# Patient Record
Sex: Female | Born: 1955 | Race: White | Hispanic: No | Marital: Married | Smoking: Former smoker
Health system: Southern US, Community
[De-identification: ages and names within clinical notes are randomized; demographics above are authoritative.]

## PROBLEM LIST (undated history)

## (undated) DIAGNOSIS — M48061 Spinal stenosis, lumbar region without neurogenic claudication: Secondary | ICD-10-CM

## (undated) DIAGNOSIS — F419 Anxiety disorder, unspecified: Secondary | ICD-10-CM

## (undated) DIAGNOSIS — D369 Benign neoplasm, unspecified site: Secondary | ICD-10-CM

## (undated) DIAGNOSIS — F909 Attention-deficit hyperactivity disorder, unspecified type: Secondary | ICD-10-CM

## (undated) DIAGNOSIS — T7840XA Allergy, unspecified, initial encounter: Secondary | ICD-10-CM

## (undated) DIAGNOSIS — Z789 Other specified health status: Secondary | ICD-10-CM

## (undated) DIAGNOSIS — R09A2 Foreign body sensation, throat: Secondary | ICD-10-CM

## (undated) DIAGNOSIS — K219 Gastro-esophageal reflux disease without esophagitis: Secondary | ICD-10-CM

## (undated) DIAGNOSIS — M1990 Unspecified osteoarthritis, unspecified site: Secondary | ICD-10-CM

## (undated) DIAGNOSIS — L4050 Arthropathic psoriasis, unspecified: Secondary | ICD-10-CM

## (undated) DIAGNOSIS — E785 Hyperlipidemia, unspecified: Secondary | ICD-10-CM

## (undated) DIAGNOSIS — R198 Other specified symptoms and signs involving the digestive system and abdomen: Secondary | ICD-10-CM

## (undated) DIAGNOSIS — G4700 Insomnia, unspecified: Secondary | ICD-10-CM

## (undated) DIAGNOSIS — R0989 Other specified symptoms and signs involving the circulatory and respiratory systems: Secondary | ICD-10-CM

## (undated) DIAGNOSIS — R32 Unspecified urinary incontinence: Secondary | ICD-10-CM

## (undated) DIAGNOSIS — F32A Depression, unspecified: Secondary | ICD-10-CM

## (undated) DIAGNOSIS — F329 Major depressive disorder, single episode, unspecified: Secondary | ICD-10-CM

## (undated) DIAGNOSIS — T8859XA Other complications of anesthesia, initial encounter: Secondary | ICD-10-CM

## (undated) DIAGNOSIS — H269 Unspecified cataract: Secondary | ICD-10-CM

## (undated) DIAGNOSIS — T4145XA Adverse effect of unspecified anesthetic, initial encounter: Secondary | ICD-10-CM

## (undated) DIAGNOSIS — E042 Nontoxic multinodular goiter: Secondary | ICD-10-CM

## (undated) HISTORY — PX: TONSILLECTOMY: SUR1361

## (undated) HISTORY — DX: Gastro-esophageal reflux disease without esophagitis: K21.9

## (undated) HISTORY — DX: Benign neoplasm, unspecified site: D36.9

## (undated) HISTORY — DX: Other specified symptoms and signs involving the digestive system and abdomen: R19.8

## (undated) HISTORY — DX: Hyperlipidemia, unspecified: E78.5

## (undated) HISTORY — DX: Allergy, unspecified, initial encounter: T78.40XA

## (undated) HISTORY — PX: EYE SURGERY: SHX253

## (undated) HISTORY — DX: Unspecified osteoarthritis, unspecified site: M19.90

## (undated) HISTORY — DX: Unspecified urinary incontinence: R32

## (undated) HISTORY — PX: COLONOSCOPY: SHX174

## (undated) HISTORY — DX: Foreign body sensation, throat: R09.A2

## (undated) HISTORY — DX: Nontoxic multinodular goiter: E04.2

## (undated) HISTORY — PX: POLYPECTOMY: SHX149

## (undated) HISTORY — DX: Unspecified cataract: H26.9

## (undated) HISTORY — DX: Spinal stenosis, lumbar region without neurogenic claudication: M48.061

## (undated) HISTORY — DX: Attention-deficit hyperactivity disorder, unspecified type: F90.9

## (undated) HISTORY — DX: Major depressive disorder, single episode, unspecified: F32.9

## (undated) HISTORY — PX: COLONOSCOPY W/ POLYPECTOMY: SHX1380

## (undated) HISTORY — DX: Other specified symptoms and signs involving the circulatory and respiratory systems: R09.89

## (undated) HISTORY — DX: Arthropathic psoriasis, unspecified: L40.50

## (undated) HISTORY — DX: Insomnia, unspecified: G47.00

## (undated) HISTORY — DX: Depression, unspecified: F32.A

## (undated) HISTORY — DX: Other specified health status: Z78.9

## (undated) HISTORY — DX: Anxiety disorder, unspecified: F41.9

---

## 2002-08-04 ENCOUNTER — Encounter: Admission: RE | Admit: 2002-08-04 | Discharge: 2002-08-04 | Payer: Self-pay | Admitting: Obstetrics and Gynecology

## 2002-08-04 ENCOUNTER — Encounter: Payer: Self-pay | Admitting: Obstetrics and Gynecology

## 2003-08-06 ENCOUNTER — Encounter: Admission: RE | Admit: 2003-08-06 | Discharge: 2003-08-06 | Payer: Self-pay | Admitting: Obstetrics and Gynecology

## 2003-10-10 HISTORY — PX: UPPER GASTROINTESTINAL ENDOSCOPY: SHX188

## 2004-08-24 ENCOUNTER — Encounter: Admission: RE | Admit: 2004-08-24 | Discharge: 2004-08-24 | Payer: Self-pay | Admitting: Obstetrics and Gynecology

## 2004-10-09 HISTORY — PX: FOOT SURGERY: SHX648

## 2005-04-10 ENCOUNTER — Ambulatory Visit: Payer: Self-pay | Admitting: Family Medicine

## 2005-04-13 ENCOUNTER — Ambulatory Visit: Payer: Self-pay | Admitting: Family Medicine

## 2005-08-29 ENCOUNTER — Ambulatory Visit: Payer: Self-pay | Admitting: Family Medicine

## 2005-09-08 ENCOUNTER — Ambulatory Visit: Payer: Self-pay | Admitting: Family Medicine

## 2005-09-18 ENCOUNTER — Ambulatory Visit: Payer: Self-pay | Admitting: Family Medicine

## 2005-10-06 ENCOUNTER — Other Ambulatory Visit: Payer: Self-pay

## 2005-10-06 ENCOUNTER — Inpatient Hospital Stay: Payer: Self-pay | Admitting: Internal Medicine

## 2005-10-19 ENCOUNTER — Ambulatory Visit: Payer: Self-pay | Admitting: Infectious Diseases

## 2005-11-07 ENCOUNTER — Encounter: Admission: RE | Admit: 2005-11-07 | Discharge: 2005-11-07 | Payer: Self-pay | Admitting: Obstetrics and Gynecology

## 2006-01-27 LAB — HM DEXA SCAN

## 2007-05-09 ENCOUNTER — Ambulatory Visit: Payer: Self-pay

## 2008-04-02 ENCOUNTER — Ambulatory Visit: Payer: Self-pay | Admitting: Internal Medicine

## 2008-09-15 ENCOUNTER — Ambulatory Visit: Payer: Self-pay | Admitting: Family Medicine

## 2008-09-15 DIAGNOSIS — M1990 Unspecified osteoarthritis, unspecified site: Secondary | ICD-10-CM | POA: Insufficient documentation

## 2008-09-15 DIAGNOSIS — F325 Major depressive disorder, single episode, in full remission: Secondary | ICD-10-CM | POA: Insufficient documentation

## 2008-09-15 DIAGNOSIS — L259 Unspecified contact dermatitis, unspecified cause: Secondary | ICD-10-CM | POA: Insufficient documentation

## 2008-09-15 DIAGNOSIS — E7849 Other hyperlipidemia: Secondary | ICD-10-CM | POA: Insufficient documentation

## 2008-09-15 DIAGNOSIS — F329 Major depressive disorder, single episode, unspecified: Secondary | ICD-10-CM

## 2008-09-15 DIAGNOSIS — J309 Allergic rhinitis, unspecified: Secondary | ICD-10-CM | POA: Insufficient documentation

## 2008-09-15 DIAGNOSIS — K219 Gastro-esophageal reflux disease without esophagitis: Secondary | ICD-10-CM | POA: Insufficient documentation

## 2008-09-23 ENCOUNTER — Encounter: Payer: Self-pay | Admitting: Internal Medicine

## 2008-09-24 ENCOUNTER — Ambulatory Visit: Payer: Self-pay | Admitting: Internal Medicine

## 2008-10-06 ENCOUNTER — Ambulatory Visit: Payer: Self-pay | Admitting: Internal Medicine

## 2008-10-06 DIAGNOSIS — Z86010 Personal history of colonic polyps: Secondary | ICD-10-CM | POA: Insufficient documentation

## 2008-10-06 DIAGNOSIS — D369 Benign neoplasm, unspecified site: Secondary | ICD-10-CM

## 2008-10-06 HISTORY — DX: Benign neoplasm, unspecified site: D36.9

## 2008-10-13 ENCOUNTER — Telehealth: Payer: Self-pay | Admitting: Internal Medicine

## 2008-10-15 ENCOUNTER — Encounter: Payer: Self-pay | Admitting: Internal Medicine

## 2009-01-29 ENCOUNTER — Ambulatory Visit (HOSPITAL_COMMUNITY): Admission: RE | Admit: 2009-01-29 | Discharge: 2009-01-29 | Payer: Self-pay | Admitting: Obstetrics and Gynecology

## 2009-09-27 ENCOUNTER — Ambulatory Visit: Payer: Self-pay | Admitting: Internal Medicine

## 2010-01-27 LAB — HM PAP SMEAR: HM Pap smear: NORMAL

## 2010-04-28 ENCOUNTER — Ambulatory Visit: Payer: Self-pay | Admitting: General Practice

## 2010-07-20 ENCOUNTER — Ambulatory Visit: Payer: Self-pay | Admitting: Psychology

## 2010-07-27 ENCOUNTER — Ambulatory Visit: Payer: Self-pay | Admitting: Psychology

## 2010-08-03 ENCOUNTER — Ambulatory Visit: Payer: Self-pay | Admitting: Psychology

## 2010-08-17 ENCOUNTER — Ambulatory Visit: Payer: Self-pay | Admitting: Psychology

## 2010-10-05 ENCOUNTER — Encounter
Admission: RE | Admit: 2010-10-05 | Discharge: 2010-10-05 | Payer: Self-pay | Source: Home / Self Care | Attending: Obstetrics and Gynecology | Admitting: Obstetrics and Gynecology

## 2011-01-28 LAB — HM PAP SMEAR: HM Pap smear: NORMAL

## 2011-08-18 ENCOUNTER — Encounter: Payer: Self-pay | Admitting: Internal Medicine

## 2011-08-21 ENCOUNTER — Ambulatory Visit (INDEPENDENT_AMBULATORY_CARE_PROVIDER_SITE_OTHER): Payer: 59 | Admitting: Internal Medicine

## 2011-08-21 ENCOUNTER — Encounter: Payer: Self-pay | Admitting: Internal Medicine

## 2011-08-21 DIAGNOSIS — Z124 Encounter for screening for malignant neoplasm of cervix: Secondary | ICD-10-CM

## 2011-08-21 DIAGNOSIS — Z23 Encounter for immunization: Secondary | ICD-10-CM

## 2011-08-21 DIAGNOSIS — M19079 Primary osteoarthritis, unspecified ankle and foot: Secondary | ICD-10-CM | POA: Insufficient documentation

## 2011-08-21 DIAGNOSIS — N390 Urinary tract infection, site not specified: Secondary | ICD-10-CM

## 2011-08-21 DIAGNOSIS — M1990 Unspecified osteoarthritis, unspecified site: Secondary | ICD-10-CM

## 2011-08-21 DIAGNOSIS — Z1239 Encounter for other screening for malignant neoplasm of breast: Secondary | ICD-10-CM

## 2011-08-21 DIAGNOSIS — M129 Arthropathy, unspecified: Secondary | ICD-10-CM

## 2011-08-21 LAB — POCT URINALYSIS DIPSTICK
Glucose, UA: NEGATIVE
Nitrite, UA: NEGATIVE
Protein, UA: NEGATIVE
Urobilinogen, UA: 0.2

## 2011-08-21 MED ORDER — CELECOXIB 200 MG PO CAPS: 200.0000 mg | ORAL_CAPSULE | Freq: Two times a day (BID) | ORAL | Status: DC

## 2011-08-21 NOTE — Assessment & Plan Note (Signed)
Records requested

## 2011-08-21 NOTE — Progress Notes (Signed)
Subjective:    Patient ID: Madeline Young, female    DOB: Jul 19, 1956, 55 y.o.   MRN: [id number]  HPI  .     Review of Systems     Objective:   Physical Exam        Assessment & Plan:   Subjective:     Madeline Young is a 55 y.o. female and is here for a comprehensive physical exam. The patient reports problems - wit joints in hands and feets continuing to be destroyed by an as yet to be named inflammatory arthritis. She was recently sent to rheumatology, first to Dr. Kathi Ludwig who did not diagnose RA.  She returned to Dr. Kellie Simmering in [facility] and saw a hand specialist Dr. Shepard General because of  bilateral thumb joint deterioration who refused to recommend surgery until her condition was diagnosed and treated.  She has sought a 3rd opinion on rheumatologist. Dr. 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  She does not need a PAP.   ,  Colonoscopy in 2010 by gessner.  Records not available.   History   Social History  . Marital Status: Married    Spouse Name: N/A    Number of Children: N/A  . Years of Education: N/A   Occupational History  . Not on file.   Social History Main Topics  . Smoking status: Former Smoker    Types: Cigarettes    Quit date: 08/21/1971  . Smokeless tobacco: Never Used  . Alcohol Use: Yes  . Drug Use: No  . Sexually Active: Not on file   Other Topics Concern  . Not on file   Social History Narrative  . No narrative on file     The following portions of the patient's history were reviewed and updated as appropriate: allergies, current medications, past family history, past medical history, past social history, past surgical history and problem list.  Review of Systems Pertinent items are noted in HPI.   Objective:    BP 114/60  Pulse 81  Temp(Src) 98.2 F (36.8 C) (Oral)  Resp 14  Ht 5' 6.5" (1.689 m)  Wt 174 lb 4 oz (79.039 kg)  BMI 27.70 kg/m2  SpO2 100% General appearance: alert, cooperative and appears stated age Head: Normocephalic, without obvious abnormality, atraumatic Eyes: conjunctivae/corneas clear. PERRL, EOM's intact. Fundi benign. Throat: lips, mucosa, and tongue normal; teeth and gums normal Neck: no adenopathy, no carotid bruit, no JVD, supple, symmetrical, trachea midline and thyroid not enlarged, symmetric, no tenderness/mass/nodules Back: symmetric, no curvature. ROM normal. No CVA tenderness. Lungs: clear to auscultation bilaterally Breasts: normal appearance, no masses or tenderness Heart: regular rate and rhythm, S1, S2 normal, no murmur, click, rub or gallop Abdomen: soft, non-tender; bowel sounds normal; no masses,  no organomegaly Extremities: extremities normal, atraumatic, no cyanosis or edema  desructive changes noted both thenar joints  Pulses: 2+ and symmetric    Assessment:    Healthy female exam.  Pelvic exam was deferred since it was normal last year. Inflammatory arthritis:  With ongoing destructive changes to small joints involving her hands and feet.  She is awaiting followup with Dr. Dierdre Forth for diagosis and treatment.  Thus far no DMARDS have been, for unclear reasons  Screening for breast cancer:  Plan  Mammogram Fasting lipids, CMET

## 2011-08-23 NOTE — Progress Notes (Signed)
Addended by: Jobie Quaker on: 08/23/2011 09:18 AM   Modules accepted: Orders

## 2011-08-25 LAB — URINE CULTURE
Colony Count: NO GROWTH
Organism ID, Bacteria: NO GROWTH

## 2011-10-12 ENCOUNTER — Inpatient Hospital Stay: Admission: RE | Admit: 2011-10-12 | Payer: 59 | Source: Ambulatory Visit

## 2011-10-16 ENCOUNTER — Other Ambulatory Visit: Payer: Self-pay | Admitting: Internal Medicine

## 2011-10-16 DIAGNOSIS — Z1231 Encounter for screening mammogram for malignant neoplasm of breast: Secondary | ICD-10-CM

## 2011-10-19 ENCOUNTER — Inpatient Hospital Stay: Admission: RE | Admit: 2011-10-19 | Payer: 59 | Source: Ambulatory Visit

## 2011-11-06 ENCOUNTER — Telehealth: Payer: Self-pay | Admitting: Internal Medicine

## 2011-11-06 DIAGNOSIS — M064 Inflammatory polyarthropathy: Secondary | ICD-10-CM

## 2011-11-06 NOTE — Telephone Encounter (Signed)
[phone] Cell # [phone]  Pt called to see if she could get a referral to [facility] [place] rheumatologist that you all discuss in oct.

## 2011-11-09 ENCOUNTER — Ambulatory Visit
Admission: RE | Admit: 2011-11-09 | Discharge: 2011-11-09 | Disposition: A | Discharge status: Home / Self Care | Payer: 59 | Source: Ambulatory Visit | Attending: Internal Medicine | Admitting: Internal Medicine

## 2011-11-09 DIAGNOSIS — Z1231 Encounter for screening mammogram for malignant neoplasm of breast: Secondary | ICD-10-CM

## 2011-11-09 NOTE — Telephone Encounter (Signed)
Madeline Young i couldn't find this order

## 2011-11-09 NOTE — Telephone Encounter (Signed)
Order placed in epic,  Look for printed order for me to sign and give to [facility]

## 2011-11-14 ENCOUNTER — Encounter: Payer: Self-pay | Admitting: Internal Medicine

## 2011-11-14 ENCOUNTER — Encounter: Payer: Self-pay | Admitting: *Deleted

## 2011-11-14 NOTE — Telephone Encounter (Signed)
I have tried calling patient to find out which doctor she wanted to see and make the referral for her.  Left msg on home machine and cell went straight to voice mail,didn't leave msg.

## 2011-12-08 NOTE — Telephone Encounter (Signed)
On 11/14/11 Marj sent this referral to [facility].

## 2012-03-17 ENCOUNTER — Other Ambulatory Visit: Payer: Self-pay | Admitting: Internal Medicine

## 2012-05-10 ENCOUNTER — Encounter: Payer: Self-pay | Admitting: Internal Medicine

## 2012-05-10 ENCOUNTER — Ambulatory Visit (INDEPENDENT_AMBULATORY_CARE_PROVIDER_SITE_OTHER): Payer: 59 | Admitting: Internal Medicine

## 2012-05-10 ENCOUNTER — Telehealth: Payer: Self-pay | Admitting: Internal Medicine

## 2012-05-10 VITALS — BP 120/64 | HR 90 | Temp 99.0°F | Resp 16 | Wt 182.0 lb

## 2012-05-10 DIAGNOSIS — M1990 Unspecified osteoarthritis, unspecified site: Secondary | ICD-10-CM

## 2012-05-10 DIAGNOSIS — R220 Localized swelling, mass and lump, head: Secondary | ICD-10-CM

## 2012-05-10 DIAGNOSIS — M129 Arthropathy, unspecified: Secondary | ICD-10-CM

## 2012-05-10 DIAGNOSIS — IMO0001 Reserved for inherently not codable concepts without codable children: Secondary | ICD-10-CM

## 2012-05-10 DIAGNOSIS — IMO0002 Reserved for concepts with insufficient information to code with codable children: Secondary | ICD-10-CM

## 2012-05-10 DIAGNOSIS — L03019 Cellulitis of unspecified finger: Secondary | ICD-10-CM

## 2012-05-10 MED ORDER — CEPHALEXIN 500 MG PO TABS: 500.0000 mg | ORAL_TABLET | Freq: Four times a day (QID) | ORAL | Status: DC

## 2012-05-10 MED ORDER — CEPHALEXIN 500 MG PO TABS: 500.0000 mg | ORAL_TABLET | Freq: Four times a day (QID) | ORAL | Status: AC

## 2012-05-10 NOTE — Progress Notes (Addendum)
Patient ID: Madeline Young, female   DOB: 1955-12-03, 56 y.o.   MRN: [id number]  Patient Active Problem List  Diagnosis  . HYPERLIPIDEMIA  . DEPRESSION  . ALLERGIC RHINITIS  . GERD  . ECZEMA  . OSTEOARTHRITIS  . Screening for cervical cancer  . Screening for breast cancer  . Arthritis of ankle or foot, degenerative  . Paronychia of second finger of left hand  . Arthritis    Subjective:  CC:   Chief Complaint  Patient presents with  . Hand Pain    finger    HPI:   Madeline K Raynoris a 56 y.o. female who presents with Paronychia.  For the past 2 weeks she has had swelling and discharge from the 55-year-old cuticle of her third finger on the left hand. She has been soaking in water using topical antimicrobials with no significant change.  2)  Worsening joint pain, not responding to recent use of plaquenil,  celebrex and voltaren gel. However she has not been taking the plaque when no more than 2 or 3 weeks. She is currently seeing a rheumatologist in [place] who is treating her for an aggressive osteoarthritis. 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    Past Medical History  Diagnosis Date  . GERD (gastroesophageal reflux disease)   . Insomnia   . Depression   . Hyperlipidemia   . ADHD (attention deficit hyperactivity disorder)   . Arthritis     Past Surgical History  Procedure Date  . Foot surgery 2006    Right foot , secondary to severe loss of joint [facility])         The following portions of the patient's history were reviewed and updated as appropriate: Allergies, current medications, and problem list.    Review of Systems:  Positive for joint pain affecting both hands both feet and both shoulders. Comprehensive review of systems  was negative except those addressed in the HPI,     History   Social History  . Marital Status: Married    Spouse Name: N/A    Number of Children: N/A  . Years of Education: N/A   Occupational History  . Not on file.   Social History Main Topics  . Smoking status: Former Smoker    Types: Cigarettes    Quit date: 08/21/1971  . Smokeless tobacco: Never Used  . Alcohol Use: Yes  . Drug Use: No  . Sexually Active: Not on file   Other Topics Concern  . Not on file   Social History Narrative  . No narrative on file    Objective:  BP 120/64  Pulse 90  Temp 99 F (37.2 C) (Oral)  Resp 16  Wt 182 lb (82.555 kg)  SpO2 97%  General appearance: alert, cooperative and appears stated age Ears: normal TM's and external ear canals both ears Throat: lips, mucosa, and tongue normal; teeth and gums normal Neck: no adenopathy, no carotid bruit, supple, symmetrical, trachea midline and thyroid not enlarged, symmetric, no tenderness/mass/nodules Back: symmetric, no curvature. ROM normal. No CVA tenderness. Lungs: clear to auscultation bilaterally Heart: regular rate and rhythm, S1, S2 normal, no murmur, click, rub or gallop Abdomen: soft, non-tender; bowel sounds normal; no masses,  no organomegaly Pulses: 2+ and symmetric Skin: Swollen inflamed cuticle third finger  left hand.  Lymph nodes: Cervical, supraclavicular, and axillary nodes normal. MSK:  Both hands in March by significant Heberden's nodes on all fingers and loss of intact joint at the first metacarpal.   Assessment and Plan:  Paronychia of second finger of left hand I prescribed Keflex 500 mg 3 times a day for 7 days. She can also soak the finger in salt water for 15 minutes at a time once or twice a day. Warm compresses. Do not try to express more drainage the finger.  Arthritis Had a long discussion with her today about her obstructive arthritis pattern. Because of the change in offices I do not have any records  from the old office or from her rheumatologist regarding her prior workup for autoimmune diseases. We discussed getting a second opinion from a rheumatologist either [facility] or [facility]. 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 She is considering applying for disability. It would certainly help this if she had documented autoimmune disease such as psoriatic arthritis, lupus, or rheumatoid arthritis. I have requested records from her current rheumatologist.  Head or neck swelling, mass, or lump She was noted to have a soft tissue mass at her manubrium today which when palpated cause her to feel like her throat was closing. She has no history of dysphasia or choking. No history of lymphoma or night sweats or fevers. Subcutaneous ultrasound of the neck for soft tissue mass has been ordered.   Updated Medication List Outpatient Encounter Prescriptions as of 05/10/2012  Medication Sig Dispense Refill  . aspirin 81 MG tablet Take 81 mg by mouth daily.        . CELEBREX 200 MG capsule TAKE ONE CAPSULE BY MOUTH TWICE DAILY  30 each  2  . cholecalciferol (VITAMIN D) 1000 UNITS tablet Take 1,000 Units by mouth daily.        . diclofenac sodium (VOLTAREN) 1 % GEL Apply 1 application topically as needed.        Marland Kitchen FLUoxetine (PROZAC) 40 MG capsule Take 40 mg by mouth daily.        Marland Kitchen HYDROcodone-acetaminophen (VICODIN) 5-500 MG per tablet Take 1 tablet by mouth every 6 (six) hours as needed.        Marland Kitchen lisdexamfetamine (VYVANSE) 60 MG capsule Take 60 mg by mouth every morning.        . Magnesium 100 MG CAPS Take by mouth.        . Nutritional Supplements (MELATONIN PO) Take by mouth.        . zolpidem (AMBIEN) 10 MG tablet Take 10 mg by mouth at bedtime as needed.        Marland Kitchen DISCONTD: Cephalexin 500 MG tablet Take 1 tablet (500 mg total) by mouth 4 (four) times daily.  40  tablet  0  . DISCONTD: fluticasone (FLONASE) 50 MCG/ACT nasal spray Place 2 sprays into the nose daily.

## 2012-05-10 NOTE — Telephone Encounter (Signed)
Caller: [name]/Patient; PCP: Duncan Dull; CB#: [phone]; Infected fingernail Pt calling regarding infected fingernail on left index finger. Onset "2 weeks ago" after having manicure with acrylic put on nails. Pt states it is very swollen, painful and there is some numbness. Bleeds when it is "hit". Afebrile. Pt has been cleaning and dressing with Neoporin and bandaging for 2 weeks with no improvement. Triaged per Hand Injury. Disp: See ED Immediately for: New change in sensation (numb, tingling, or no feeling), change in skin color (pale or blue), feels cool to the touch, severe pain or no pulse below level of injury. Appt made for 8/2 at 1445 with Dr. Dan Humphreys.

## 2012-05-12 ENCOUNTER — Encounter: Payer: Self-pay | Admitting: Internal Medicine

## 2012-05-12 DIAGNOSIS — R220 Localized swelling, mass and lump, head: Secondary | ICD-10-CM | POA: Insufficient documentation

## 2012-05-12 DIAGNOSIS — L4052 Psoriatic arthritis mutilans: Secondary | ICD-10-CM | POA: Insufficient documentation

## 2012-05-12 NOTE — Assessment & Plan Note (Signed)
Had a long discussion with her today about her obstructive arthritis pattern. Because of the change in offices I do not have any records from the old office or from her rheumatologist regarding her prior workup for autoimmune diseases. We discussed getting a second opinion from a rheumatologist either [facility] or [facility]. 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 She is considering applying for disability. It would certainly help this if she had documented autoimmune disease such as psoriatic arthritis, lupus, or rheumatoid arthritis. I have requested records from her current rheumatologist.

## 2012-05-12 NOTE — Assessment & Plan Note (Signed)
She was noted to have a soft tissue mass at her manubrium today which when palpated cause her to feel like her throat was closing. She has no history of dysphasia or choking. No history of lymphoma or night sweats or fevers. Subcutaneous ultrasound of the neck for soft tissue mass has been ordered.

## 2012-05-12 NOTE — Assessment & Plan Note (Signed)
I prescribed Keflex 500 mg 3 times a day for 7 days. She can also soak the finger in salt water for 15 minutes at a time once or twice a day. Warm compresses. Do not try to express more drainage the finger.

## 2012-06-06 ENCOUNTER — Ambulatory Visit: Payer: Self-pay | Admitting: Internal Medicine

## 2012-06-11 ENCOUNTER — Telehealth: Payer: Self-pay | Admitting: Internal Medicine

## 2012-06-11 DIAGNOSIS — Z13220 Encounter for screening for lipoid disorders: Secondary | ICD-10-CM

## 2012-06-11 DIAGNOSIS — E042 Nontoxic multinodular goiter: Secondary | ICD-10-CM | POA: Insufficient documentation

## 2012-06-11 NOTE — Assessment & Plan Note (Signed)
By recent US.  TSH Free T4 and Endocrine referral made.

## 2012-06-11 NOTE — Telephone Encounter (Signed)
Her neck ultrasound showed that she has multiple thyroid nodules.  She hasn't had any labs done in over a year (none in chart since 2010 scanned in). So I would like her to return for fasting lipids, TSH and free T4 and a CMET, while I schedule a referral to Endocrinology .  Dr. Tedd Sias at [facility].

## 2012-06-11 NOTE — Telephone Encounter (Signed)
Patient was notified by Lowella Bandy. Lab appt scheduled. Patient advised that she will hear from someone in our office regarding appt to Endo

## 2012-06-11 NOTE — Telephone Encounter (Signed)
Left message on cell # asking patient to call back.

## 2012-06-12 ENCOUNTER — Other Ambulatory Visit (INDEPENDENT_AMBULATORY_CARE_PROVIDER_SITE_OTHER): Payer: 59 | Admitting: *Deleted

## 2012-06-12 DIAGNOSIS — E785 Hyperlipidemia, unspecified: Secondary | ICD-10-CM

## 2012-06-12 DIAGNOSIS — Z13220 Encounter for screening for lipoid disorders: Secondary | ICD-10-CM

## 2012-06-12 DIAGNOSIS — E042 Nontoxic multinodular goiter: Secondary | ICD-10-CM

## 2012-06-12 LAB — TSH: TSH: 1.42 u[IU]/mL (ref 0.35–5.50)

## 2012-06-12 LAB — LDL CHOLESTEROL, DIRECT: Direct LDL: 252.3 mg/dL

## 2012-06-12 LAB — T4, FREE: Free T4: 0.72 ng/dL (ref 0.60–1.60)

## 2012-06-12 NOTE — Addendum Note (Signed)
Addended by: Jobie Quaker on: 06/12/2012 08:44 AM   Modules accepted: Orders

## 2012-06-13 ENCOUNTER — Other Ambulatory Visit: Payer: Self-pay | Admitting: Internal Medicine

## 2012-06-14 MED ORDER — ATORVASTATIN CALCIUM 20 MG PO TABS: 20.0000 mg | ORAL_TABLET | Freq: Every day | ORAL | Status: DC

## 2012-06-14 NOTE — Assessment & Plan Note (Signed)
LDL 252

## 2012-06-14 NOTE — Addendum Note (Signed)
Addended by: Duncan Dull on: 06/14/2012 10:35 AM   Modules accepted: Orders

## 2012-06-17 ENCOUNTER — Telehealth: Payer: Self-pay | Admitting: Internal Medicine

## 2012-06-17 NOTE — Telephone Encounter (Signed)
The reason I have recommended an endocrinologist is because she has multiple solid appearing nodules on her thyroid and may need a thyroid uptake scan or fine needle aspiration to be sure that none of them are cancerous before presuming that it is just a multinodular goiter

## 2012-06-17 NOTE — Telephone Encounter (Signed)
Patient called and is questioning why she has to see an endocrinologist.  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  I also advised her of her lab results and she stated you already know that she can't tolerate any statin drugs because of arthritis.  She wanted to compare it to the last lab draw and the only lab scanned in is from 2010, and it has came down since then.  She stated she has struggled with her cholesterol since she was 56 years old.

## 2012-06-17 NOTE — Telephone Encounter (Signed)
Left message asking patient to return my call.

## 2012-06-18 NOTE — Telephone Encounter (Signed)
Patient notified

## 2012-06-24 ENCOUNTER — Encounter: Payer: Self-pay | Admitting: Internal Medicine

## 2012-08-09 ENCOUNTER — Ambulatory Visit (INDEPENDENT_AMBULATORY_CARE_PROVIDER_SITE_OTHER): Payer: 59 | Admitting: Internal Medicine

## 2012-08-09 ENCOUNTER — Encounter: Payer: Self-pay | Admitting: Internal Medicine

## 2012-08-09 VITALS — BP 128/72 | HR 85 | Temp 98.3°F | Resp 12 | Ht 66.0 in | Wt 184.5 lb

## 2012-08-09 DIAGNOSIS — E785 Hyperlipidemia, unspecified: Secondary | ICD-10-CM

## 2012-08-09 DIAGNOSIS — Z6825 Body mass index (BMI) 25.0-25.9, adult: Secondary | ICD-10-CM

## 2012-08-09 DIAGNOSIS — M1990 Unspecified osteoarthritis, unspecified site: Secondary | ICD-10-CM

## 2012-08-09 DIAGNOSIS — E663 Overweight: Secondary | ICD-10-CM

## 2012-08-09 DIAGNOSIS — Z23 Encounter for immunization: Secondary | ICD-10-CM

## 2012-08-09 DIAGNOSIS — E042 Nontoxic multinodular goiter: Secondary | ICD-10-CM

## 2012-08-09 MED ORDER — ZOSTER VACCINE LIVE 19400 UNT/0.65ML ~~LOC~~ SOLR: 0.6500 mL | Freq: Once | SUBCUTANEOUS | Status: DC

## 2012-08-09 MED ORDER — PHENTERMINE HCL 37.5 MG PO TABS
18.5000 mg | ORAL_TABLET | Freq: Two times a day (BID) | ORAL | Status: DC
Start: 2012-08-09 — End: 2013-01-27

## 2012-08-09 MED ORDER — COLESEVELAM HCL 625 MG PO TABS: 1875.0000 mg | ORAL_TABLET | Freq: Two times a day (BID) | ORAL | Status: DC

## 2012-08-09 NOTE — Patient Instructions (Signed)
We  Will tryn phentermine 1/2 tablet twice daily before breakfast, and in early agfternoon

## 2012-08-09 NOTE — Progress Notes (Signed)
Patient ID: Madeline Young, female   DOB: 1956-09-05, 56 y.o.   MRN: [id number] Patient Active Problem List  Diagnosis  . HYPERLIPIDEMIA  . DEPRESSION  . ALLERGIC RHINITIS  . GERD  . ECZEMA  . OSTEOARTHRITIS  . Screening for cervical cancer  . Screening for breast cancer  . Paronychia of second finger of left hand  . Arthritis  . Multinodular goiter (nontoxic)  . Overweight (BMI 25.0-29.9)    Subjective:  CC:   Chief Complaint  Patient presents with  . Injections    HPI:   Madeline Young a 56 y.o. female who presents Follow up on multiple issues.  She had an abnormal thyroid ultrasound recently with multiple modules seen and was diagnosed with multinodular goiter. Endocrine evaluation with FNA was negative.  She did not believe that the TSH and Free T4 were accurate (normal) and  asked Madeline Young to order additional tests which were indicative of normal thyroid function.   2) saw a rheumatologist Madeline Young at [facility].  She was not satisfied with his rapid evaluation which yielded the diagnosis of severe OA,  no signs of SLE .  She is concerned that she has a  yet undiagnosed  Progressive autoimmune disease, particularly psoriatic but her other rheumatologist, Madeline Young disagrees. She has episodes of joint pain flareups preceded by an inintentional weight loss.  The last  Flare up lasted 3 months.  She was started on placquenil 3 months ago and feels somewhat  better.  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 She cannot open medication bottles or push meds out of the cards.  She cannot use a keyboard or write for more that 30 minutes because her wrist weakens and drops .  She cannot pump up a BP cuff.  She cannot squat .  She needs to sit and stand in intervals..  She frequently drops things.  Her hand  tires with repetteive motions.  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   3) Trying to lose weight but cannot  lose the weight despite modifying her diet.  She is walking as much as she can but too much joint pain is prohibiting her ability to increased her activity . Belviq  Requested (lorcaserin HCl) .     Past Medical History  Diagnosis Date  . GERD (gastroesophageal reflux disease)   . Insomnia   . Depression   . Hyperlipidemia   . ADHD (attention deficit hyperactivity disorder)   . Arthritis     Past Surgical History  Procedure Date  . Foot surgery 2006    Right foot , secondary to severe loss of joint Madeline Young)         The following portions of the patient's history were reviewed and updated as appropriate: Allergies, current medications, and problem list.    Review of Systems:   12 Pt  review of systems was negative except those addressed in the HPI,     History   Social History  . Marital Status: Married    Spouse Name: N/A    Number of Children: N/A  . Years of Education:  N/A   Occupational History  . Not on file.   Social History Main Topics  . Smoking status: Former Smoker    Types: Cigarettes    Quit date: 08/21/1971  . Smokeless tobacco: Never Used  . Alcohol Use: Yes  . Drug Use: No  . Sexually Active: Not on file   Other Topics Concern  . Not on file   Social History Narrative  . No narrative on file    Objective:  BP 128/72  Pulse 85  Temp 98.3 F (36.8 C) (Oral)  Resp 12  Ht 5\' 6"  (1.676 m)  Wt 184 lb 8 oz (83.689 kg)  BMI 29.78 kg/m2  SpO2 97%  General appearance: alert, cooperative and appears stated age Ears: normal TM's and external ear canals both ears Throat: lips, mucosa, and tongue normal; teeth and gums normal Neck: no adenopathy, no carotid bruit, supple,  symmetrical, trachea midline and thyroid not enlarged, symmetric, no tenderness/mass/nodules Back: symmetric, no curvature. ROM normal. No CVA tenderness. Lungs: clear to auscultation bilaterally Heart: regular rate and rhythm, S1, S2 normal, no murmur, click, rub or gallop Abdomen: soft, non-tender; bowel sounds normal; no masses,  no organomegaly Pulses: 2+ and symmetric Skin: Skin color, texture, turgor normal. No rashes or lesions Lymph nodes: Cervical, supraclavicular, and axillary nodes normal.  Assessment and Plan:  HYPERLIPIDEMIA She has not tried well chol. Intolerant to other statins.   Multinodular goiter (nontoxic) Normal FNA, normal thyroid function per eval by Madeline Young .  OSTEOARTHRITIS Severe, degenerative, with second rheumatologic opinion yielding same diagnosis.  Hands, ankles and back are affected. However, symptoms improving what Plaquenil. 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   Overweight (BMI 25.0-29.9) She has been unable to lose more than 9 lbs .  Requesting appetite  suppressant.  Trial of phentermine .  Risk and benefits discussed   Updated Medication List Outpatient Encounter Prescriptions as of 08/09/2012  Medication Sig Dispense Refill  . CELEBREX 200 MG capsule TAKE ONE CAPSULE BY MOUTH TWICE DAILY  30 each  1  . cholecalciferol (VITAMIN D) 1000 UNITS tablet Take 1,000 Units by mouth daily.        . diclofenac sodium (VOLTAREN) 1 % GEL Apply 1 application topically as needed.        Marland Kitchen FLUoxetine (PROZAC) 40 MG capsule Take 60 mg by mouth daily.       Marland Kitchen HYDROcodone-acetaminophen (VICODIN) 5-500 MG per tablet Take 1 tablet by mouth every 6 (six) hours as needed.        Marland Kitchen lisdexamfetamine (VYVANSE) 60 MG capsule Take 50 mg by mouth every morning.       . Magnesium 100 MG CAPS Take by mouth.        . Nutritional Supplements (MELATONIN PO) Take by mouth.        . zolpidem (AMBIEN) 10 MG tablet Take 10 mg by mouth at bedtime  as needed.        Marland Kitchen aspirin 81 MG tablet Take 81 mg by mouth daily.        Marland Kitchen atorvastatin (LIPITOR) 20 MG tablet Take 1 tablet (20 mg total) by mouth daily.  90 tablet  3  . colesevelam (WELCHOL) 625 MG tablet Take 3 tablets (1,875 mg total) by mouth 2 (two) times daily with a meal.  30 tablet  3  . phentermine (ADIPEX-P) 37.5 MG tablet Take 0.5 tablets (18.75 mg total) by mouth 2 (two) times  daily with breakfast and lunch.  30 tablet  1  . zoster vaccine live, PF, (ZOSTAVAX) 16109 UNT/0.65ML injection Inject 19,400 Units into the skin once.  1 vial  0     Orders Placed This Encounter  Procedures  . Tdap vaccine greater than or equal to 7yo IM  . Flu vaccine greater than or equal to 3yo preservative free IM    No Follow-up on file.

## 2012-08-09 NOTE — Assessment & Plan Note (Signed)
She has not tried well chol. Intolerant to other statins.

## 2012-08-11 DIAGNOSIS — E663 Overweight: Secondary | ICD-10-CM | POA: Insufficient documentation

## 2012-08-11 DIAGNOSIS — E042 Nontoxic multinodular goiter: Secondary | ICD-10-CM | POA: Insufficient documentation

## 2012-08-11 NOTE — Assessment & Plan Note (Signed)
She has been unable to lose more than 9 lbs .  Requesting appetite  suppressant.  Trial of phentermine .  Risk and benefits discussed

## 2012-08-11 NOTE — Assessment & Plan Note (Addendum)
Severe, degenerative, with second rheumatologic opinion yielding same diagnosis.  Hands, ankles and back are affected. However, symptoms improving what Plaquenil. 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

## 2012-08-11 NOTE — Assessment & Plan Note (Signed)
Normal FNA, normal thyroid function per eval by Dr. Tedd Sias .

## 2012-08-12 ENCOUNTER — Telehealth: Payer: Self-pay | Admitting: Internal Medicine

## 2012-08-12 MED ORDER — CELECOXIB 200 MG PO CAPS: 200.0000 mg | ORAL_CAPSULE | Freq: Two times a day (BID) | ORAL | Status: DC

## 2012-08-12 NOTE — Telephone Encounter (Signed)
90 day supply #180 pills rx sent to [facility]

## 2012-08-12 NOTE — Telephone Encounter (Signed)
Pt called on her rx  celebrex  Pt stated when she picked up her rx the instruction stated take 2 daily Only 30 pills in bottle  Pt needs 60 pills per month.  Pt would like to get refills on this 3 month at time if possible [facility] Please advise pt when called in.  Pt called she has 1 week left

## 2012-10-09 HISTORY — PX: BIOPSY THYROID: PRO38

## 2012-12-31 ENCOUNTER — Encounter: Payer: Self-pay | Admitting: Internal Medicine

## 2012-12-31 ENCOUNTER — Ambulatory Visit (INDEPENDENT_AMBULATORY_CARE_PROVIDER_SITE_OTHER): Payer: 59 | Admitting: Internal Medicine

## 2012-12-31 VITALS — BP 122/74 | HR 83 | Temp 98.6°F | Resp 16 | Wt 186.8 lb

## 2012-12-31 DIAGNOSIS — N399 Disorder of urinary system, unspecified: Secondary | ICD-10-CM

## 2012-12-31 DIAGNOSIS — R300 Dysuria: Secondary | ICD-10-CM

## 2012-12-31 DIAGNOSIS — E785 Hyperlipidemia, unspecified: Secondary | ICD-10-CM

## 2012-12-31 DIAGNOSIS — E669 Obesity, unspecified: Secondary | ICD-10-CM

## 2012-12-31 DIAGNOSIS — R3989 Other symptoms and signs involving the genitourinary system: Secondary | ICD-10-CM

## 2012-12-31 LAB — POCT URINALYSIS DIPSTICK
Ketones, UA: NEGATIVE
Leukocytes, UA: NEGATIVE
Protein, UA: NEGATIVE
Spec Grav, UA: 1.01
Urobilinogen, UA: 0.2
pH, UA: 5.5

## 2012-12-31 NOTE — Patient Instructions (Addendum)
This is  my version of a  "Low GI"  Diet:  It will still lower your blood sugars and allow you to lose 4 to 8  lbs  per month if you follow it carefully and combine it with 30 minutes of aerobic exercise 5 days per week .   All of the foods can be found at grocery stores and in bulk at [employer].  The Atkins protein bars and shakes are available in more varieties at [employer], [employer] and [employer].     7 AM Breakfast:  Choose from the following:  Low carbohydrate Protein  Shakes (I recommend the EAS AdvantEdge "Carb Control" shakes  Or the low carb shakes by Atkins.    2.5 carbs   Arnold's "Sandwhich Thin"toasted  w/ peanut butter (no jelly: about 20 net carbs  "Bagel Thin" with cream cheese and salmon: about 20 carbs   a scrambled egg/bacon/cheese burrito made with Mission's "carb balance" whole wheat tortilla  (about 10 net carbs )   Avoid cereal and bananas, oatmeal and cream of wheat and grits. They are loaded with carbohydrates!   10 AM: high protein snack  Protein bar by Atkins (the snack size, under 200 cal, usually < 6 net carbs).    A stick of cheese:  Around 1 carb,  100 cal     Dannon Light n Fit [place] Yogurt  (80 cal, 8 carbs)  Other so called "protein bars" and Greek yogurts tend to be loaded with carbohydrates.  Remember, in food advertising, the word "energy" is synonymous for " carbohydrate."  Lunch:   A Sandwich using the bread choices listed, Can use any  Eggs,  lunchmeat, grilled meat or canned tuna), avocado, regular mayo/mustard  and cheese.  A Salad using clue cheese, ranch,  Goddess or vinagrette,  No croutons or "confetti" and no "candied nuts" but regular nuts OK.   No pretzels or chips.  Pickles and miniature sweet peppers are a good low carb alternative  The bread is the only source or carbohydrate that can be decreased (Joseph's makes a pita bread and a flat bread that are 50 cal and 4 net carbs ; Toufayan makes a low carb flatbread that's 100 cal and 9 net  carbs  and  Mission's carb balance whole wheat tortilla  That is 210 cal and 6 net carbs) Avoid "Low fat dressings, as well as Reyne Dumas and [address] dressings They are loaded with sugar!   3 PM/ Mid day  Snack:  Consider  1 ounce of  almonds, walnuts, pistachios, pecans, peanuts,  Macadamia nuts or a nut medley.  Avoid "granola"; the dried cranberries and raisins are loaded with carbohydrates. Mixed nuts ok if no raisins or cranberries or dried fruit.     6 PM  Dinner:    "mean and green, "  Meat/chicken/fish with a green salad, and broccoli, cauliflower, green beans, spinach, brussel sprouts or  Lima beans::       There is a low carb pasta by Dreamfield's available at [employer] that is acceptable and tastes great only 5 diestible carbs/serving.   Try Michel Angelo's chicken piccata or chicken or eggplant parm over low carb pasta.   Clifton Custard Sanchez's "Carnitas" (pulled pork, no sauce,  0 carbs) or his beef pot roast to make a dinner burrito  Whole wheat pasta is still full of digestible carbs and  Not as low in glycemic index as Dreamfield's.   Brown rice is still  rice,  So skip the rice and noodles if you eat [place] or [place]  9 PM snack :   Breyer's "low carb" fudgsicle or  ice cream bar (Carb Smart line), or  Weight Watcher's ice cream bar , or another "no sugar added" ice cream;  a serving of fresh berries/cherries with whipped cream   Cheese or yogurt  Avoid bananas, pineapple, grapes  and watermelon on a regular basis because they are high in sugar)   Remember that snack Substitutions should be less than 10 carbs per serving and meals < 20 carbs. Remember to subtract fiber grams to get the "net carbs."  You must drink  3 16 ounce bottles of water daily  Bread choices should be < 13 carbs per serving

## 2012-12-31 NOTE — Progress Notes (Signed)
Patient ID: Madeline Young, female   DOB: [birth year], 57 y.o.   MRN: [id number]    Patient Active Problem List  Diagnosis  . HYPERLIPIDEMIA  . DEPRESSION  . ALLERGIC RHINITIS  . GERD  . ECZEMA  . OSTEOARTHRITIS  . Screening for cervical cancer  . Screening for breast cancer  . Arthritis  . Multinodular goiter (nontoxic)  . Obesity, unspecified  . Urinary problem    Subjective:  CC:   Chief Complaint  Patient presents with  . Urinary Tract Infection    urine has strong odor, burning    HPI:   Madeline K Raynoris a 57 y.o. female who presents for acute visit for evaluation of 1) Foul smelling urine . She denies dysuria and suprapubic pain.  She has  a chroncic vaginal discharge which has been cultured and found B- for infection. 2) weight gain.Wants to try belvique for wt loss .  Tried phentermine and it did not curb her appetite. She's gained 12 pounds since November 2012. She is not exercising regularly or following a specific diet.   Past Medical History  Diagnosis Date  . GERD (gastroesophageal reflux disease)   . Insomnia   . Depression   . Hyperlipidemia   . ADHD (attention deficit hyperactivity disorder)   . Arthritis     Past Surgical History  Procedure Laterality Date  . Foot surgery  2006    Right foot , secondary to severe loss of joint [facility]       The following portions of the patient's history were reviewed and updated as appropriate: Allergies, current medications, and problem list.    Review of Systems:   Patient denies headache, fevers, malaise, unintentional weight loss, skin rash, eye pain, sinus congestion and sinus pain, sore throat, dysphagia,  hemoptysis , cough, dyspnea, wheezing, chest pain, palpitations, orthopnea, edema, abdominal pain, nausea, melena, diarrhea, constipation, flank pain, dysuria, hematuria, urinary  Frequency, nocturia, numbness, tingling, seizures,  Focal weakness, Loss of consciousness,  Tremor, insomnia,  depression, anxiety, and suicidal ideation.     History   Social History  . Marital Status: Married    Spouse Name: N/A    Number of Children: N/A  . Years of Education: N/A   Occupational History  . Not on file.   Social History Main Topics  . Smoking status: Former Smoker    Types: Cigarettes    Quit date: 08/21/1971  . Smokeless tobacco: Never Used  . Alcohol Use: Yes  . Drug Use: No  . Sexually Active: Not on file   Other Topics Concern  . Not on file   Social History Narrative  . No narrative on file    Objective:  BP 122/74  Pulse 83  Temp(Src) 98.6 F (37 C) (Oral)  Resp 16  Wt 186 lb 12 oz (84.709 kg)  BMI 30.16 kg/m2  SpO2 96%  General appearance: alert, cooperative and appears stated age Ears: normal TM's and external ear canals both ears Throat: lips, mucosa, and tongue normal; teeth and gums normal Neck: no adenopathy, no carotid bruit, supple, symmetrical, trachea midline and thyroid not enlarged, symmetric, no tenderness/mass/nodules Back: symmetric, no curvature. ROM normal. No CVA tenderness. Lungs: clear to auscultation bilaterally Heart: regular rate and rhythm, S1, S2 normal, no murmur, click, rub or gallop Abdomen: soft, non-tender; bowel sounds normal; no masses,  no organomegaly Pulses: 2+ and symmetric Skin: Skin color, texture, turgor normal. No rashes or lesions Lymph nodes: Cervical, supraclavicular, and axillary nodes normal.  Assessment and Plan:  HYPERLIPIDEMIA Intolerant of statins. Due to increased joint pain .  Obesity, unspecified I have addressed  BMI and recommended a low glycemic index diet utilizing smaller more frequent meals to increase metabolism.  I have also recommended that patient start exercising with a goal of 30 minutes of aerobic exercise a minimum of 5 days per weekly.    Urinary problem Urinalysis today was completely normal. I have explained to her that the smell of her urine and assessment of infection  the more to do with what she has been. Asked her to return for pelvic exam if her pelvic discharge has not been completely evaluated by her gynecologist.  A total of 25 minutes was spent with patient more than half of which was spent in counseling, reviewing records from other prviders and coordination of care.  Updated Medication List Outpatient Encounter Prescriptions as of 12/31/2012  Medication Sig Dispense Refill  . aspirin 81 MG tablet Take 81 mg by mouth daily.        . celecoxib (CELEBREX) 200 MG capsule Take 1 capsule (200 mg total) by mouth 2 (two) times daily.  180 capsule  1  . cetirizine (ZYRTEC) 10 MG tablet Take 10 mg by mouth daily.      . cholecalciferol (VITAMIN D) 1000 UNITS tablet Take 1,000 Units by mouth daily.        . diclofenac sodium (VOLTAREN) 1 % GEL Apply 1 application topically as needed.        Marland Kitchen FLUoxetine (PROZAC) 40 MG capsule Take 60 mg by mouth daily.       Marland Kitchen HYDROcodone-acetaminophen (NORCO/VICODIN) 5-325 MG per tablet Take 1 tablet by mouth every 6 (six) hours as needed for pain.      . hydroxychloroquine (PLAQUENIL) 200 MG tablet Take 400 mg by mouth daily.      Marland Kitchen lisdexamfetamine (VYVANSE) 50 MG capsule Take 50 mg by mouth every morning.      . Magnesium 100 MG CAPS Take by mouth.        . Nutritional Supplements (MELATONIN PO) Take by mouth.        . zolpidem (AMBIEN) 10 MG tablet Take 10 mg by mouth at bedtime as needed.        . [DISCONTINUED] lisdexamfetamine (VYVANSE) 60 MG capsule Take 50 mg by mouth every morning.       . colesevelam (WELCHOL) 625 MG tablet Take 3 tablets (1,875 mg total) by mouth 2 (two) times daily with a meal.  30 tablet  3  . phentermine (ADIPEX-P) 37.5 MG tablet Take 0.5 tablets (18.75 mg total) by mouth 2 (two) times daily with breakfast and lunch.  30 tablet  1  . zoster vaccine live, PF, (ZOSTAVAX) 56213 UNT/0.65ML injection Inject 19,400 Units into the skin once.  1 vial  0  . [DISCONTINUED] atorvastatin (LIPITOR) 20 MG  tablet Take 1 tablet (20 mg total) by mouth daily.  90 tablet  3  . [DISCONTINUED] HYDROcodone-acetaminophen (VICODIN) 5-500 MG per tablet Take 1 tablet by mouth every 6 (six) hours as needed.         No facility-administered encounter medications on file as of 12/31/2012.     Orders Placed This Encounter  Procedures  . POCT urinalysis dipstick    No Follow-up on file.

## 2012-12-31 NOTE — Assessment & Plan Note (Signed)
Intolerant of statins. Due to increased joint pain .

## 2013-01-01 ENCOUNTER — Encounter: Payer: Self-pay | Admitting: Internal Medicine

## 2013-01-01 DIAGNOSIS — R3989 Other symptoms and signs involving the genitourinary system: Secondary | ICD-10-CM | POA: Insufficient documentation

## 2013-01-01 NOTE — Assessment & Plan Note (Signed)
I have addressed  BMI and recommended a low glycemic index diet utilizing smaller more frequent meals to increase metabolism.  I have also recommended that patient start exercising with a goal of 30 minutes of aerobic exercise a minimum of 5 days per weekly.

## 2013-01-01 NOTE — Assessment & Plan Note (Signed)
Urinalysis today was completely normal. I have explained to her that the smell of her urine and assessment of infection the more to do with what she has been. Asked her to return for pelvic exam if her pelvic discharge has not been completely evaluated by her gynecologist.

## 2013-01-17 LAB — HM PAP SMEAR: HM Pap smear: NORMAL

## 2013-01-27 ENCOUNTER — Other Ambulatory Visit (HOSPITAL_COMMUNITY)
Admission: RE | Admit: 2013-01-27 | Discharge: 2013-01-27 | Disposition: A | Discharge status: Home / Self Care | Payer: 59 | Source: Ambulatory Visit | Attending: Internal Medicine | Admitting: Internal Medicine

## 2013-01-27 ENCOUNTER — Ambulatory Visit (INDEPENDENT_AMBULATORY_CARE_PROVIDER_SITE_OTHER): Payer: 59 | Admitting: Internal Medicine

## 2013-01-27 ENCOUNTER — Encounter: Payer: Self-pay | Admitting: Internal Medicine

## 2013-01-27 VITALS — BP 122/68 | HR 77 | Temp 98.1°F | Resp 16 | Wt 184.5 lb

## 2013-01-27 DIAGNOSIS — Z1231 Encounter for screening mammogram for malignant neoplasm of breast: Secondary | ICD-10-CM

## 2013-01-27 DIAGNOSIS — G720 Drug-induced myopathy: Secondary | ICD-10-CM | POA: Insufficient documentation

## 2013-01-27 DIAGNOSIS — Z789 Other specified health status: Secondary | ICD-10-CM

## 2013-01-27 DIAGNOSIS — M129 Arthropathy, unspecified: Secondary | ICD-10-CM

## 2013-01-27 DIAGNOSIS — F3289 Other specified depressive episodes: Secondary | ICD-10-CM

## 2013-01-27 DIAGNOSIS — Z0000 Encounter for general adult medical examination without abnormal findings: Secondary | ICD-10-CM

## 2013-01-27 DIAGNOSIS — Z7184 Encounter for health counseling related to travel: Secondary | ICD-10-CM | POA: Insufficient documentation

## 2013-01-27 DIAGNOSIS — F329 Major depressive disorder, single episode, unspecified: Secondary | ICD-10-CM

## 2013-01-27 DIAGNOSIS — M1990 Unspecified osteoarthritis, unspecified site: Secondary | ICD-10-CM

## 2013-01-27 DIAGNOSIS — Z01419 Encounter for gynecological examination (general) (routine) without abnormal findings: Secondary | ICD-10-CM | POA: Insufficient documentation

## 2013-01-27 DIAGNOSIS — Z888 Allergy status to other drugs, medicaments and biological substances status: Secondary | ICD-10-CM

## 2013-01-27 DIAGNOSIS — E785 Hyperlipidemia, unspecified: Secondary | ICD-10-CM

## 2013-01-27 DIAGNOSIS — Z124 Encounter for screening for malignant neoplasm of cervix: Secondary | ICD-10-CM

## 2013-01-27 DIAGNOSIS — E042 Nontoxic multinodular goiter: Secondary | ICD-10-CM

## 2013-01-27 DIAGNOSIS — D126 Benign neoplasm of colon, unspecified: Secondary | ICD-10-CM

## 2013-01-27 DIAGNOSIS — Z79899 Other long term (current) drug therapy: Secondary | ICD-10-CM

## 2013-01-27 DIAGNOSIS — T466X5A Adverse effect of antihyperlipidemic and antiarteriosclerotic drugs, initial encounter: Secondary | ICD-10-CM | POA: Insufficient documentation

## 2013-01-27 DIAGNOSIS — Z1239 Encounter for other screening for malignant neoplasm of breast: Secondary | ICD-10-CM

## 2013-01-27 DIAGNOSIS — Z1151 Encounter for screening for human papillomavirus (HPV): Secondary | ICD-10-CM | POA: Insufficient documentation

## 2013-01-27 HISTORY — DX: Other specified health status: Z78.9

## 2013-01-27 LAB — COMPREHENSIVE METABOLIC PANEL
ALT: 16 U/L (ref 0–35)
CO2: 31 mEq/L (ref 19–32)
Creatinine, Ser: 0.7 mg/dL (ref 0.4–1.2)
GFR: 91.2 mL/min (ref >60.00)
Total Bilirubin: 0.7 mg/dL (ref 0.3–1.2)

## 2013-01-27 LAB — T4, FREE: Free T4: 0.76 ng/dL (ref 0.60–1.60)

## 2013-01-27 NOTE — Assessment & Plan Note (Signed)
Thyroid function are normal.  Follow p with Dr. Tedd Sias , repeat scan due

## 2013-01-27 NOTE — Assessment & Plan Note (Addendum)
She has been started on placquenil by rheumatology.

## 2013-01-27 NOTE — Progress Notes (Signed)
Patient ID: Madeline Young, female   DOB: Mar 18, 1956, 57 y.o.   MRN: [id number]   Subjective:     Madeline Young is a 57 y.o. female here for a routine exam.  Current complaints: none  Personal health questionnaire reviewed: yes.   Gynecologic History No LMP recorded. Patient is postmenopausal. Contraception: post menopausal status Last Pap: 2012 Results were: normal Last mammogram: 2012. Results were: normal  Obstetric History OB History   Grav Para Term Preterm Abortions TAB SAB Ect Mult Living                   The following portions of the patient's history were reviewed and updated as appropriate: allergies, current medications, past family history, past medical history, past social history, past surgical history and problem list.  Review of Systems A comprehensive review of systems was negative.    Objective:     BP 122/68  Pulse 77  Temp(Src) 98.1 F (36.7 C) (Oral)  Resp 16  Wt 184 lb 8 oz (83.689 kg)  BMI 29.79 kg/m2  SpO2 99%  General Appearance:    Alert, cooperative, no distress, appears stated age  Head:    Normocephalic, without obvious abnormality, atraumatic  Eyes:    PERRL, conjunctiva/corneas clear, EOM's intact, fundi    benign, both eyes  Ears:    Normal TM's and external ear canals, both ears  Nose:   Nares normal, septum midline, mucosa normal, no drainage    or sinus tenderness  Throat:   Lips, mucosa, and tongue normal; teeth and gums normal  Neck:   Supple, symmetrical, trachea midline, no adenopathy;    thyroid:  no enlargement/tenderness/nodules; no carotid   bruit or JVD  Back:     Symmetric, no curvature, ROM normal, no CVA tenderness  Lungs:     Clear to auscultation bilaterally, respirations unlabored  Chest Wall:    No tenderness or deformity   Heart:    Regular rate and rhythm, S1 and S2 normal, no murmur, rub   or gallop  Breast Exam:    No tenderness, masses, or nipple abnormality  Abdomen:     Soft, non-tender, bowel  sounds active all four quadrants,    no masses, no organomegaly  Genitalia:    Pelvic: cervix normal in appearance, external genitalia normal, no adnexal masses or tenderness, no cervical motion tenderness, rectovaginal septum normal, uterus normal size, shape, and consistency and vagina normal without discharge  Extremities:   Extremities normal, atraumatic, no cyanosis or edema  Pulses:   2+ and symmetric all extremities  Skin:   Skin color, texture, turgor normal, no rashes or lesions  Lymph nodes:   Cervical, supraclavicular, and axillary nodes normal  Neurologic:   CNII-XII intact, normal strength, sensation and reflexes    throughout      Assessment:   Screening for cervical cancer PAP smear done today  Screening for breast cancer Breast exam normal.  Mammogram ordered.  HYPERLIPIDEMIA She is intolerant of statins.   DEPRESSION Managed with buspar.   Arthritis She has been started on placquenil by rheumatology.   Multinodular goiter (nontoxic) Thyroid function are normal.  Follow p with Dr. Tedd Sias , repeat scan due   Routine general medical examination at a health care facility Annual comprehensive exam was done including breast, pelvic and PAP smear. All screenings have been addressed .    Updated Medication List Outpatient Encounter Prescriptions as of 01/27/2013  Medication Sig Dispense Refill  . aspirin 81  MG tablet Take 81 mg by mouth daily.        . busPIRone (BUSPAR) 30 MG tablet Take 20 mg by mouth 2 (two) times daily.      . celecoxib (CELEBREX) 200 MG capsule Take 1 capsule (200 mg total) by mouth 2 (two) times daily.  180 capsule  1  . cetirizine (ZYRTEC) 10 MG tablet Take 10 mg by mouth daily.      . cholecalciferol (VITAMIN D) 1000 UNITS tablet Take 1,000 Units by mouth daily.        . diclofenac sodium (VOLTAREN) 1 % GEL Apply 1 application topically as needed.        Marland Kitchen FLUoxetine (PROZAC) 40 MG capsule Take 60 mg by mouth daily.       Marland Kitchen  HYDROcodone-acetaminophen (NORCO/VICODIN) 5-325 MG per tablet Take 1 tablet by mouth every 6 (six) hours as needed for pain.      . hydroxychloroquine (PLAQUENIL) 200 MG tablet Take 400 mg by mouth daily.      Marland Kitchen lisdexamfetamine (VYVANSE) 50 MG capsule Take 50 mg by mouth every morning.      . Nutritional Supplements (MELATONIN PO) Take by mouth.        . zolpidem (AMBIEN) 10 MG tablet Take 10 mg by mouth at bedtime as needed.        . zoster vaccine live, PF, (ZOSTAVAX) 16109 UNT/0.65ML injection Inject 19,400 Units into the skin once.  1 vial  0  . Magnesium 100 MG CAPS Take by mouth.        . [DISCONTINUED] colesevelam (WELCHOL) 625 MG tablet Take 3 tablets (1,875 mg total) by mouth 2 (two) times daily with a meal.  30 tablet  3  . [DISCONTINUED] phentermine (ADIPEX-P) 37.5 MG tablet Take 0.5 tablets (18.75 mg total) by mouth 2 (two) times daily with breakfast and lunch.  30 tablet  1   No facility-administered encounter medications on file as of 01/27/2013.

## 2013-01-27 NOTE — Assessment & Plan Note (Signed)
Breast exam normal. Mammogram ordered. 

## 2013-01-27 NOTE — Assessment & Plan Note (Signed)
Managed with buspar.

## 2013-01-27 NOTE — Patient Instructions (Addendum)
We will forward your thyroid tests to Dr Tedd Sias  Mammogram ordered.

## 2013-01-27 NOTE — Assessment & Plan Note (Signed)
She is intolerant of statins.

## 2013-01-27 NOTE — Assessment & Plan Note (Signed)
Annual comprehensive exam was done including breast, pelvic and PAP smear. All screenings have been addressed .  

## 2013-01-27 NOTE — Assessment & Plan Note (Signed)
PAP smear done today  

## 2013-01-30 ENCOUNTER — Encounter: Payer: Self-pay | Admitting: *Deleted

## 2013-02-19 ENCOUNTER — Ambulatory Visit: Payer: 59

## 2013-02-26 ENCOUNTER — Ambulatory Visit
Admission: RE | Admit: 2013-02-26 | Discharge: 2013-02-26 | Disposition: A | Discharge status: Home / Self Care | Payer: 59 | Source: Ambulatory Visit | Attending: Internal Medicine | Admitting: Internal Medicine

## 2013-02-26 DIAGNOSIS — Z1231 Encounter for screening mammogram for malignant neoplasm of breast: Secondary | ICD-10-CM

## 2013-04-22 ENCOUNTER — Encounter: Payer: Self-pay | Admitting: Internal Medicine

## 2013-08-26 ENCOUNTER — Other Ambulatory Visit: Payer: Self-pay | Admitting: Internal Medicine

## 2013-09-24 ENCOUNTER — Encounter: Payer: Self-pay | Admitting: Internal Medicine

## 2013-09-30 ENCOUNTER — Encounter: Payer: Self-pay | Admitting: Internal Medicine

## 2013-12-02 DIAGNOSIS — M3500 Sicca syndrome, unspecified: Secondary | ICD-10-CM | POA: Diagnosis not present

## 2013-12-02 DIAGNOSIS — M159 Polyosteoarthritis, unspecified: Secondary | ICD-10-CM | POA: Diagnosis not present

## 2014-02-10 DIAGNOSIS — M3500 Sicca syndrome, unspecified: Secondary | ICD-10-CM | POA: Diagnosis not present

## 2014-02-10 DIAGNOSIS — M159 Polyosteoarthritis, unspecified: Secondary | ICD-10-CM | POA: Diagnosis not present

## 2014-03-02 ENCOUNTER — Other Ambulatory Visit: Payer: Self-pay | Admitting: Internal Medicine

## 2014-03-02 DIAGNOSIS — M1990 Unspecified osteoarthritis, unspecified site: Secondary | ICD-10-CM

## 2014-03-03 NOTE — Telephone Encounter (Signed)
RF for Celebrex sent to [facility] in [facility]

## 2014-03-07 ENCOUNTER — Encounter: Payer: Self-pay | Admitting: Internal Medicine

## 2014-03-26 DIAGNOSIS — D235 Other benign neoplasm of skin of trunk: Secondary | ICD-10-CM | POA: Diagnosis not present

## 2014-03-26 DIAGNOSIS — L408 Other psoriasis: Secondary | ICD-10-CM | POA: Diagnosis not present

## 2014-03-26 DIAGNOSIS — D2360 Other benign neoplasm of skin of unspecified upper limb, including shoulder: Secondary | ICD-10-CM | POA: Diagnosis not present

## 2014-03-26 DIAGNOSIS — L821 Other seborrheic keratosis: Secondary | ICD-10-CM | POA: Diagnosis not present

## 2014-06-02 DIAGNOSIS — M25559 Pain in unspecified hip: Secondary | ICD-10-CM | POA: Diagnosis not present

## 2014-06-02 DIAGNOSIS — M5126 Other intervertebral disc displacement, lumbar region: Secondary | ICD-10-CM | POA: Diagnosis not present

## 2014-06-23 DIAGNOSIS — F321 Major depressive disorder, single episode, moderate: Secondary | ICD-10-CM | POA: Diagnosis not present

## 2014-06-27 DIAGNOSIS — Z23 Encounter for immunization: Secondary | ICD-10-CM | POA: Diagnosis not present

## 2014-07-20 ENCOUNTER — Other Ambulatory Visit: Payer: Self-pay

## 2014-07-20 DIAGNOSIS — Z1239 Encounter for other screening for malignant neoplasm of breast: Secondary | ICD-10-CM

## 2014-07-22 DIAGNOSIS — M797 Fibromyalgia: Secondary | ICD-10-CM | POA: Diagnosis not present

## 2014-07-22 DIAGNOSIS — M5136 Other intervertebral disc degeneration, lumbar region: Secondary | ICD-10-CM | POA: Diagnosis not present

## 2014-07-22 DIAGNOSIS — M150 Primary generalized (osteo)arthritis: Secondary | ICD-10-CM | POA: Diagnosis not present

## 2014-07-29 ENCOUNTER — Ambulatory Visit: Payer: 59

## 2014-08-05 ENCOUNTER — Encounter: Payer: Self-pay | Admitting: Internal Medicine

## 2014-08-09 ENCOUNTER — Encounter: Payer: Self-pay | Admitting: Internal Medicine

## 2014-08-12 ENCOUNTER — Other Ambulatory Visit: Payer: Self-pay

## 2014-08-12 ENCOUNTER — Ambulatory Visit
Admission: RE | Admit: 2014-08-12 | Discharge: 2014-08-12 | Disposition: A | Discharge status: Home / Self Care | Payer: 59 | Source: Ambulatory Visit

## 2014-08-12 DIAGNOSIS — Z1231 Encounter for screening mammogram for malignant neoplasm of breast: Secondary | ICD-10-CM

## 2014-08-17 ENCOUNTER — Encounter: Payer: Self-pay | Admitting: *Deleted

## 2014-08-31 DIAGNOSIS — H2511 Age-related nuclear cataract, right eye: Secondary | ICD-10-CM | POA: Diagnosis not present

## 2014-08-31 DIAGNOSIS — H2512 Age-related nuclear cataract, left eye: Secondary | ICD-10-CM | POA: Diagnosis not present

## 2014-08-31 DIAGNOSIS — H25041 Posterior subcapsular polar age-related cataract, right eye: Secondary | ICD-10-CM | POA: Diagnosis not present

## 2014-08-31 DIAGNOSIS — H25011 Cortical age-related cataract, right eye: Secondary | ICD-10-CM | POA: Diagnosis not present

## 2014-09-08 ENCOUNTER — Encounter: Payer: Self-pay | Admitting: Internal Medicine

## 2014-09-11 DIAGNOSIS — H2511 Age-related nuclear cataract, right eye: Secondary | ICD-10-CM | POA: Diagnosis not present

## 2014-09-16 DIAGNOSIS — H2512 Age-related nuclear cataract, left eye: Secondary | ICD-10-CM | POA: Diagnosis not present

## 2014-10-09 ENCOUNTER — Encounter: Payer: Self-pay | Admitting: Internal Medicine

## 2014-10-09 DIAGNOSIS — M545 Low back pain: Secondary | ICD-10-CM | POA: Diagnosis not present

## 2014-10-09 DIAGNOSIS — M5136 Other intervertebral disc degeneration, lumbar region: Secondary | ICD-10-CM | POA: Diagnosis not present

## 2014-10-09 DIAGNOSIS — M6281 Muscle weakness (generalized): Secondary | ICD-10-CM | POA: Diagnosis not present

## 2014-10-12 DIAGNOSIS — F321 Major depressive disorder, single episode, moderate: Secondary | ICD-10-CM | POA: Diagnosis not present

## 2014-10-13 DIAGNOSIS — M150 Primary generalized (osteo)arthritis: Secondary | ICD-10-CM | POA: Diagnosis not present

## 2014-10-13 DIAGNOSIS — M797 Fibromyalgia: Secondary | ICD-10-CM | POA: Diagnosis not present

## 2014-10-13 DIAGNOSIS — R5383 Other fatigue: Secondary | ICD-10-CM | POA: Diagnosis not present

## 2014-10-13 DIAGNOSIS — L4050 Arthropathic psoriasis, unspecified: Secondary | ICD-10-CM | POA: Diagnosis not present

## 2014-10-13 DIAGNOSIS — M5136 Other intervertebral disc degeneration, lumbar region: Secondary | ICD-10-CM | POA: Diagnosis not present

## 2014-11-09 ENCOUNTER — Encounter: Payer: Self-pay | Admitting: Internal Medicine

## 2014-11-09 DIAGNOSIS — M6281 Muscle weakness (generalized): Secondary | ICD-10-CM | POA: Diagnosis not present

## 2014-11-09 DIAGNOSIS — M545 Low back pain: Secondary | ICD-10-CM | POA: Diagnosis not present

## 2014-11-09 DIAGNOSIS — M5136 Other intervertebral disc degeneration, lumbar region: Secondary | ICD-10-CM | POA: Diagnosis not present

## 2014-11-17 ENCOUNTER — Encounter: Payer: Self-pay | Admitting: Internal Medicine

## 2014-11-17 ENCOUNTER — Encounter (INDEPENDENT_AMBULATORY_CARE_PROVIDER_SITE_OTHER): Payer: Self-pay

## 2014-11-17 ENCOUNTER — Ambulatory Visit (INDEPENDENT_AMBULATORY_CARE_PROVIDER_SITE_OTHER): Payer: Commercial Managed Care - PPO | Admitting: Internal Medicine

## 2014-11-17 VITALS — BP 106/60 | HR 73 | Temp 98.7°F | Resp 16 | Ht 66.0 in | Wt 170.0 lb

## 2014-11-17 DIAGNOSIS — K219 Gastro-esophageal reflux disease without esophagitis: Secondary | ICD-10-CM | POA: Insufficient documentation

## 2014-11-17 DIAGNOSIS — R358 Other polyuria: Secondary | ICD-10-CM | POA: Diagnosis not present

## 2014-11-17 DIAGNOSIS — Z79899 Other long term (current) drug therapy: Secondary | ICD-10-CM | POA: Diagnosis not present

## 2014-11-17 DIAGNOSIS — E559 Vitamin D deficiency, unspecified: Secondary | ICD-10-CM | POA: Diagnosis not present

## 2014-11-17 DIAGNOSIS — K2100 Gastro-esophageal reflux disease with esophagitis, without bleeding: Secondary | ICD-10-CM

## 2014-11-17 DIAGNOSIS — Z124 Encounter for screening for malignant neoplasm of cervix: Secondary | ICD-10-CM | POA: Diagnosis not present

## 2014-11-17 DIAGNOSIS — E042 Nontoxic multinodular goiter: Secondary | ICD-10-CM

## 2014-11-17 DIAGNOSIS — E785 Hyperlipidemia, unspecified: Secondary | ICD-10-CM

## 2014-11-17 DIAGNOSIS — Z1211 Encounter for screening for malignant neoplasm of colon: Secondary | ICD-10-CM

## 2014-11-17 DIAGNOSIS — Z860100 Personal history of colon polyps, unspecified: Secondary | ICD-10-CM

## 2014-11-17 DIAGNOSIS — R3589 Other polyuria: Secondary | ICD-10-CM

## 2014-11-17 DIAGNOSIS — L4052 Psoriatic arthritis mutilans: Secondary | ICD-10-CM

## 2014-11-17 DIAGNOSIS — E663 Overweight: Secondary | ICD-10-CM

## 2014-11-17 DIAGNOSIS — Z0000 Encounter for general adult medical examination without abnormal findings: Secondary | ICD-10-CM

## 2014-11-17 LAB — POCT URINALYSIS DIPSTICK
Bilirubin, UA: NEGATIVE
Blood, UA: NEGATIVE
Glucose, UA: NEGATIVE
KETONES UA: NEGATIVE
LEUKOCYTES UA: NEGATIVE
Nitrite, UA: NEGATIVE
PH UA: 7
PROTEIN UA: NEGATIVE
SPEC GRAV UA: 1.015
Urobilinogen, UA: 0.2

## 2014-11-17 MED ORDER — IPRATROPIUM BROMIDE 0.06 % NA SOLN: 2.0000 | Freq: Four times a day (QID) | NASAL | Status: DC

## 2014-11-17 NOTE — Patient Instructions (Signed)
RETURN FOR FASTING LABS ASAP  WE WILL CALL YOU WITH THE GI REFERRAL FOR EGD AND COLONOSCOPY  Health Maintenance Adopting a healthy lifestyle and getting preventive care can go a long way to promote health and wellness. Talk with your health care provider about what schedule of regular examinations is right for you. This is a good chance for you to check in with your provider about disease prevention and staying healthy. In between checkups, there are plenty of things you can do on your own. Experts have done a lot of research about which lifestyle changes and preventive measures are most likely to keep you healthy. Ask your health care provider for more information. WEIGHT AND DIET  Eat a healthy diet  Be sure to include plenty of vegetables, fruits, low-fat dairy products, and lean protein.  Do not eat a lot of foods high in solid fats, added sugars, or salt.  Get regular exercise. This is one of the most important things you can do for your health.  Most adults should exercise for at least 150 minutes each week. The exercise should increase your heart rate and make you sweat (moderate-intensity exercise).  Most adults should also do strengthening exercises at least twice a week. This is in addition to the moderate-intensity exercise.  Maintain a healthy weight  Body mass index (BMI) is a measurement that can be used to identify possible weight problems. It estimates body fat based on height and weight. Your health care provider can help determine your BMI and help you achieve or maintain a healthy weight.  For females 42 years of age and older:   A BMI below 18.5 is considered underweight.  A BMI of 18.5 to 24.9 is normal.  A BMI of 25 to 29.9 is considered overweight.  A BMI of 30 and above is considered obese.  Watch levels of cholesterol and blood lipids  You should start having your blood tested for lipids and cholesterol at 61 years of age, then have this test every 5  years.  You may need to have your cholesterol levels checked more often if:  Your lipid or cholesterol levels are high.  You are older than 61 years of age.  You are at high risk for heart disease.  CANCER SCREENING   Lung Cancer  Lung cancer screening is recommended for adults 98-31 years old who are at high risk for lung cancer because of a history of smoking.  A yearly low-dose CT scan of the lungs is recommended for people who:  Currently smoke.  Have quit within the past 15 years.  Have at least a 30-pack-year history of smoking. A pack year is smoking an average of one pack of cigarettes a day for 1 year.  Yearly screening should continue until it has been 15 years since you quit.  Yearly screening should stop if you develop a health problem that would prevent you from having lung cancer treatment.  Breast Cancer  Practice breast self-awareness. This means understanding how your breasts normally appear and feel.  It also means doing regular breast self-exams. Let your health care provider know about any changes, no matter how small.  If you are in your 20s or 30s, you should have a clinical breast exam (CBE) by a health care provider every 1-3 years as part of a regular health exam.  If you are 80 or older, have a CBE every year. Also consider having a breast X-ray (mammogram) every year.  If you have  a family history of breast cancer, talk to your health care provider about genetic screening.  If you are at high risk for breast cancer, talk to your health care provider about having an MRI and a mammogram every year.  Breast cancer gene (BRCA) assessment is recommended for women who have family members with BRCA-related cancers. BRCA-related cancers include:  Breast.  Ovarian.  Tubal.  Peritoneal cancers.  Results of the assessment will determine the need for genetic counseling and BRCA1 and BRCA2 testing. Cervical Cancer Routine pelvic examinations to  screen for cervical cancer are no longer recommended for nonpregnant women who are considered low risk for cancer of the pelvic organs (ovaries, uterus, and vagina) and who do not have symptoms. A pelvic examination may be necessary if you have symptoms including those associated with pelvic infections. Ask your health care provider if a screening pelvic exam is right for you.   The Pap test is the screening test for cervical cancer for women who are considered at risk.  If you had a hysterectomy for a problem that was not cancer or a condition that could lead to cancer, then you no longer need Pap tests.  If you are older than 65 years, and you have had normal Pap tests for the past 10 years, you no longer need to have Pap tests.  If you have had past treatment for cervical cancer or a condition that could lead to cancer, you need Pap tests and screening for cancer for at least 20 years after your treatment.  If you no longer get a Pap test, assess your risk factors if they change (such as having a new sexual partner). This can affect whether you should start being screened again.  Some women have medical problems that increase their chance of getting cervical cancer. If this is the case for you, your health care provider may recommend more frequent screening and Pap tests.  The human papillomavirus (HPV) test is another test that may be used for cervical cancer screening. The HPV test looks for the virus that can cause cell changes in the cervix. The cells collected during the Pap test can be tested for HPV.  The HPV test can be used to screen women 58 years of age and older. Getting tested for HPV can extend the interval between normal Pap tests from three to five years.  An HPV test also should be used to screen women of any age who have unclear Pap test results.  After 62 years of age, women should have HPV testing as often as Pap tests.  Colorectal Cancer  This type of cancer can be  detected and often prevented.  Routine colorectal cancer screening usually begins at 61 years of age and continues through 61 years of age.  Your health care provider may recommend screening at an earlier age if you have risk factors for colon cancer.  Your health care provider may also recommend using home test kits to check for hidden blood in the stool.  A small camera at the end of a tube can be used to examine your colon directly (sigmoidoscopy or colonoscopy). This is done to check for the earliest forms of colorectal cancer.  Routine screening usually begins at age 16.  Direct examination of the colon should be repeated every 5-10 years through 61 years of age. However, you may need to be screened more often if early forms of precancerous polyps or small growths are found. Skin Cancer  Check your  skin from head to toe regularly.  Tell your health care provider about any new moles or changes in moles, especially if there is a change in a mole's shape or color.  Also tell your health care provider if you have a mole that is larger than the size of a pencil eraser.  Always use sunscreen. Apply sunscreen liberally and repeatedly throughout the day.  Protect yourself by wearing long sleeves, pants, a wide-brimmed hat, and sunglasses whenever you are outside. HEART DISEASE, DIABETES, AND HIGH BLOOD PRESSURE   Have your blood pressure checked at least every 1-2 years. High blood pressure causes heart disease and increases the risk of stroke.  If you are between 9 years and 46 years old, ask your health care provider if you should take aspirin to prevent strokes.  Have regular diabetes screenings. This involves taking a blood sample to check your fasting blood sugar level.  If you are at a normal weight and have a low risk for diabetes, have this test once every three years after 61 years of age.  If you are overweight and have a high risk for diabetes, consider being tested at a  younger age or more often. PREVENTING INFECTION  Hepatitis B  If you have a higher risk for hepatitis B, you should be screened for this virus. You are considered at high risk for hepatitis B if:  You were born in a country where hepatitis B is common. Ask your health care provider which countries are considered high risk.  Your parents were born in a high-risk country, and you have not been immunized against hepatitis B (hepatitis B vaccine).  You have HIV or AIDS.  You use needles to inject street drugs.  You live with someone who has hepatitis B.  You have had sex with someone who has hepatitis B.  You get hemodialysis treatment.  You take certain medicines for conditions, including cancer, organ transplantation, and autoimmune conditions. Hepatitis C  Blood testing is recommended for:  Everyone born from [birth year] through 1967.  Anyone with known risk factors for hepatitis C. Sexually transmitted infections (STIs)  You should be screened for sexually transmitted infections (STIs) including gonorrhea and chlamydia if:  You are sexually active and are younger than 61 years of age.  You are older than 61 years of age and your health care provider tells you that you are at risk for this type of infection.  Your sexual activity has changed since you were last screened and you are at an increased risk for chlamydia or gonorrhea. Ask your health care provider if you are at risk.  If you do not have HIV, but are at risk, it may be recommended that you take a prescription medicine daily to prevent HIV infection. This is called pre-exposure prophylaxis (PrEP). You are considered at risk if:  You are sexually active and do not regularly use condoms or know the HIV status of your partner(s).  You take drugs by injection.  You are sexually active with a partner who has HIV. Talk with your health care provider about whether you are at high risk of being infected with HIV. If you choose  to begin PrEP, you should first be tested for HIV. You should then be tested every 3 months for as long as you are taking PrEP.  PREGNANCY   If you are premenopausal and you may become pregnant, ask your health care provider about preconception counseling.  If you may become pregnant, take 400  to 800 micrograms (mcg) of folic acid every day.  If you want to prevent pregnancy, talk to your health care provider about birth control (contraception). OSTEOPOROSIS AND MENOPAUSE   Osteoporosis is a disease in which the bones lose minerals and strength with aging. This can result in serious bone fractures. Your risk for osteoporosis can be identified using a bone density scan.  If you are 47 years of age or older, or if you are at risk for osteoporosis and fractures, ask your health care provider if you should be screened.  Ask your health care provider whether you should take a calcium or vitamin D supplement to lower your risk for osteoporosis.  Menopause may have certain physical symptoms and risks.  Hormone replacement therapy may reduce some of these symptoms and risks. Talk to your health care provider about whether hormone replacement therapy is right for you.  HOME CARE INSTRUCTIONS   Schedule regular health, dental, and eye exams.  Stay current with your immunizations.   Do not use any tobacco products including cigarettes, chewing tobacco, or electronic cigarettes.  If you are pregnant, do not drink alcohol.  If you are breastfeeding, limit how much and how often you drink alcohol.  Limit alcohol intake to no more than 1 drink per day for nonpregnant women. One drink equals 12 ounces of beer, 5 ounces of wine, or 1 ounces of hard liquor.  Do not use street drugs.  Do not share needles.  Ask your health care provider for help if you need support or information about quitting drugs.  Tell your health care provider if you often feel depressed.  Tell your health care  provider if you have ever been abused or do not feel safe at home. Document Released: 04/10/2011 Document Revised: 02/09/2014 Document Reviewed: 08/27/2013 [facility] Patient Information 2015 [place], [place]. This information is not intended to replace advice given to you by your health care provider. Make sure you discuss any questions you have with your health care provider.

## 2014-11-17 NOTE — Assessment & Plan Note (Addendum)
She has recurrent daily symptoms of reflux and globus despite daily use of zegerid, and her last EGD was in 2009,  No Barrett's  Was found but she had transient relief of globus symptoms after dilation.  Will address at [facility] referral

## 2014-11-17 NOTE — Progress Notes (Signed)
Pre-visit discussion using our clinic review tool. No additional management support is needed unless otherwise documented below in the visit note.  

## 2014-11-17 NOTE — Assessment & Plan Note (Addendum)
Followed annually by Dr Gabriel Carina of [facility],  With prior benign biopsy,  For ultrasound  this month
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
## 2014-11-18 LAB — URINALYSIS, ROUTINE W REFLEX MICROSCOPIC
BILIRUBIN URINE: NEGATIVE
Hgb urine dipstick: NEGATIVE
Ketones, ur: NEGATIVE
LEUKOCYTES UA: NEGATIVE
Nitrite: NEGATIVE
PH: 7 (ref 5.0–8.0)
RBC / HPF: NONE SEEN (ref 0–?)
Specific Gravity, Urine: 1.015 (ref 1.000–1.030)
Total Protein, Urine: NEGATIVE
Urine Glucose: NEGATIVE
Urobilinogen, UA: 0.2 (ref 0.0–1.0)

## 2014-11-19 ENCOUNTER — Encounter: Payer: Self-pay | Admitting: Internal Medicine

## 2014-11-19 LAB — URINE CULTURE: Colony Count: 8000

## 2014-11-19 NOTE — Assessment & Plan Note (Signed)
I have congratulated her in reduction of   BMI and encouraged  Continued weight loss with goal of 10% of body weigh over the next 6 months using a low glycemic index diet and regular exercise a minimum of 5 days per week.    

## 2014-11-19 NOTE — Assessment & Plan Note (Signed)
Diagnosed by rheumatology with skin biopsy positive for psoriasis.  Starting Humira

## 2014-11-19 NOTE — Assessment & Plan Note (Signed)
Referral for 5 yr follow up is overdue and has been initiated

## 2014-11-19 NOTE — Assessment & Plan Note (Signed)

## 2014-12-04 DIAGNOSIS — E041 Nontoxic single thyroid nodule: Secondary | ICD-10-CM | POA: Diagnosis not present

## 2014-12-08 ENCOUNTER — Encounter
Admit: 2014-12-08 | Disposition: A | Discharge status: Home / Self Care | Payer: Self-pay | Attending: Internal Medicine | Admitting: Internal Medicine

## 2014-12-08 DIAGNOSIS — M545 Low back pain: Secondary | ICD-10-CM | POA: Diagnosis not present

## 2014-12-08 DIAGNOSIS — M5136 Other intervertebral disc degeneration, lumbar region: Secondary | ICD-10-CM | POA: Diagnosis not present

## 2014-12-08 DIAGNOSIS — M6281 Muscle weakness (generalized): Secondary | ICD-10-CM | POA: Diagnosis not present

## 2014-12-11 DIAGNOSIS — E041 Nontoxic single thyroid nodule: Secondary | ICD-10-CM | POA: Diagnosis not present

## 2014-12-15 DIAGNOSIS — M797 Fibromyalgia: Secondary | ICD-10-CM | POA: Diagnosis not present

## 2014-12-15 DIAGNOSIS — M5136 Other intervertebral disc degeneration, lumbar region: Secondary | ICD-10-CM | POA: Diagnosis not present

## 2014-12-15 DIAGNOSIS — M150 Primary generalized (osteo)arthritis: Secondary | ICD-10-CM | POA: Diagnosis not present

## 2014-12-15 DIAGNOSIS — L4050 Arthropathic psoriasis, unspecified: Secondary | ICD-10-CM | POA: Diagnosis not present

## 2014-12-23 ENCOUNTER — Encounter: Payer: Self-pay | Admitting: Internal Medicine

## 2015-01-08 ENCOUNTER — Encounter
Admit: 2015-01-08 | Disposition: A | Discharge status: Home / Self Care | Payer: Self-pay | Attending: Internal Medicine | Admitting: Internal Medicine

## 2015-01-08 DIAGNOSIS — M6281 Muscle weakness (generalized): Secondary | ICD-10-CM | POA: Diagnosis not present

## 2015-01-08 DIAGNOSIS — M545 Low back pain: Secondary | ICD-10-CM | POA: Diagnosis not present

## 2015-01-08 DIAGNOSIS — M5136 Other intervertebral disc degeneration, lumbar region: Secondary | ICD-10-CM | POA: Diagnosis not present

## 2015-01-29 ENCOUNTER — Other Ambulatory Visit: Payer: Self-pay | Admitting: Internal Medicine

## 2015-02-01 DIAGNOSIS — F321 Major depressive disorder, single episode, moderate: Secondary | ICD-10-CM | POA: Diagnosis not present

## 2015-02-01 NOTE — Telephone Encounter (Signed)
Last visit 11/17/14, ok refill? 

## 2015-02-02 NOTE — Telephone Encounter (Signed)
Ok to refill,  Refill sent  

## 2015-02-08 DIAGNOSIS — M5136 Other intervertebral disc degeneration, lumbar region: Secondary | ICD-10-CM | POA: Diagnosis not present

## 2015-02-08 DIAGNOSIS — M4806 Spinal stenosis, lumbar region: Secondary | ICD-10-CM | POA: Diagnosis not present

## 2015-02-08 DIAGNOSIS — M5416 Radiculopathy, lumbar region: Secondary | ICD-10-CM | POA: Diagnosis not present

## 2015-02-10 ENCOUNTER — Encounter: Payer: Self-pay | Admitting: Physical Therapy

## 2015-02-10 ENCOUNTER — Encounter: Payer: Commercial Managed Care - PPO | Admitting: Physical Therapy

## 2015-02-10 ENCOUNTER — Ambulatory Visit: Payer: 59 | Attending: Internal Medicine | Admitting: Physical Therapy

## 2015-02-10 DIAGNOSIS — M5136 Other intervertebral disc degeneration, lumbar region: Secondary | ICD-10-CM | POA: Insufficient documentation

## 2015-02-10 DIAGNOSIS — M6281 Muscle weakness (generalized): Secondary | ICD-10-CM | POA: Insufficient documentation

## 2015-02-10 DIAGNOSIS — M545 Low back pain, unspecified: Secondary | ICD-10-CM

## 2015-02-11 NOTE — Therapy (Signed)
[facility] [address] [place], [address] Phone: [phone]   Fax:  [phone]  Physical Therapy Treatment  Patient Details  Name: Madeline Young MRN: [id number] Date of Birth: 29-Jan-1956 Referring Provider:  Crecencio Mc, MD  Encounter Date: 02/10/2015      PT End of Session - 02/10/15 1200    Visit Number 33   Number of Visits 44   Date for PT Re-Evaluation 03/16/15   Authorization Type [id number]   Authorization Time Period 40   PT Start Time 1120   PT Stop Time 1200   PT Time Calculation (min) 40 min   Behavior During Therapy [facility] for tasks assessed/performed      Past Medical History  Diagnosis Date  . GERD (gastroesophageal reflux disease)   . Insomnia   . Depression   . Hyperlipidemia   . ADHD (attention deficit hyperactivity disorder)   . Arthritis     Past Surgical History  Procedure Laterality Date  . Foot surgery  2006    Right foot , secondary to severe loss of joint [facility])    There were no vitals filed for this visit.  Visit Diagnosis:  Low back pain, non-specific  Muscle weakness (generalized)      Subjective Assessment - 02/10/15 1214    Subjective Patient reports she is stiff in her back this morning. She continues to have increased back pain and left pain that is intermittend with pulling in her upper thigh.    Patient Stated Goals To have less pain and be stronger in order to be able to perform  household chores and work in yard with less rest periods and less difficulty   Currently in Pain? 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 at cable: standing straight arm pull down with 15# x 15 reps tactile cues to lower trapezius muscles, sitting on stability ball scapula rows high and low to front of hips x 15 reps with 15#, seated row x 15 with both UE's  with verbal cuing for correct shoulder alignment, chest press with 10# x 15 reps, sitting on stability ball: 6# weight for bilateral flexion overhead x 15 reps, total gym squats level #10 intensity 2 x 15 with ball between knees, leg press 35# x 15 reps, 55# x 20 reps with ball between knees for correct alignment, TRX for push ups x 10 reps with cuing for plank position without flexion at hips, assisted squats with TRX with verbal cuing 3. NuStep (not billed; independent)  x 10 min. @ level #4-5 workload  Improved alignment and stabilization with minimal verbal cuing for most exercises, fatigued with total gym squats therefore limited repetitions  Plan: see below                      PT Education - 02/10/15 1150    Education provided Yes   Education Details re assessment of posture, exercise with core control, appropriate intensity and repetitoin   Person(s) Educated Patient   Methods Explanation   Comprehension Verbalized understanding;Returned demonstration             PT Long Term Goals - 02/11/15 1512    PT LONG TERM GOAL #1   Title Patient will report pain level 3/10 max. on NRPS for lower back/ LE    Time 6   Period Weeks   Status On-going   PT LONG TERM GOAL #2   Title Patient will be independent with home program  for self management of advanced exercises, posture awareness    Time 6   Period Weeks   Status On-going   PT LONG TERM GOAL #3   Title Patient will demonstrate proper posture, ergonomics and body mechanics with all DL's for return to household tasks with minimal limitation    Time 6   Period Weeks   Status On-going   PT LONG TERM GOAL #4   Title Patient will demonstrate improved perceived disability on Modified Oswestry to 30% or less indicating improved function with daily tasks    Time 6   Period Weeks   Status Revised               Plan - 02/11/15 1508    Clinical Impression Statement Patient was able to perform exercises with minimal cuing primarily for proper alignment of upper back and shoulders and correct control of speed for proper control/strengthening. She continues with weakness and pain in lower back into hips and will benefit from continued physical therapy intervention for additional instruction in progressive strength and stabilization with appropriate modifications as pain dictates.    Pt will benefit from skilled therapeutic intervention in order to improve on the following deficits Decreased strength;Increased muscle spasms;Pain   Rehab Potential Good   Clinical Impairments Affecting Rehab Potential chronic condition,    PT Frequency 2x / week   PT Duration 6 weeks   PT Treatment/Interventions Therapeutic activities;Therapeutic exercise;Electrical Stimulation;Moist Heat;Manual techniques   PT Next Visit Plan therapeutic exercises for improving core control and strength in LE's and trunk, modalities as indicated for pain control, manual techniques for pain control, mobility        Problem List Patient Active Problem List   Diagnosis Date Noted  . GERD (gastroesophageal reflux disease) 11/17/2014  . Statin intolerance 01/27/2013  . Routine general medical examination at a health care facility 01/27/2013  . Multinodular goiter (nontoxic) 08/11/2012  .  Overweight (BMI 25.0-29.9) 08/11/2012  . Psoriatic arthritis, destructive type   . Screening for cervical cancer 08/21/2011  . Screening for breast cancer 08/21/2011  . History of colonic polyps 10/06/2008  . HYPERLIPIDEMIA 09/15/2008  . DEPRESSION 09/15/2008  . ALLERGIC RHINITIS 09/15/2008  . ECZEMA 09/15/2008  . OSTEOARTHRITIS 09/15/2008    Aldona Lento 02/11/2015, 4:40 PM  [facility] [address] [place], [address] Phone: [phone]   Fax:  [phone]

## 2015-02-16 ENCOUNTER — Ambulatory Visit: Payer: 59 | Admitting: Physical Therapy

## 2015-02-16 ENCOUNTER — Encounter: Payer: Self-pay | Admitting: Physical Therapy

## 2015-02-16 DIAGNOSIS — M545 Low back pain, unspecified: Secondary | ICD-10-CM

## 2015-02-16 DIAGNOSIS — M6281 Muscle weakness (generalized): Secondary | ICD-10-CM | POA: Diagnosis not present

## 2015-02-16 DIAGNOSIS — M5136 Other intervertebral disc degeneration, lumbar region: Secondary | ICD-10-CM | POA: Diagnosis not present

## 2015-02-17 NOTE — Therapy (Signed)
[facility] [address], [place], [address] Phone: [phone]   Fax:  [phone]  Physical Therapy Treatment  Patient Details  Name: Madeline Young MRN: [id number] Date of Birth: 24-Nov-1955 Referring Provider:  Crecencio Mc, MD  Encounter Date: 02/16/2015      PT End of Session - 02/16/15 1122    Visit Number 34   Number of Visits 44   Date for PT Re-Evaluation 03/16/15   Authorization Type 34   Authorization Time Period 40   PT Start Time 1039   PT Stop Time 1123   PT Time Calculation (min) 44 min   Activity Tolerance Patient tolerated treatment well   Behavior During Therapy [facility] for tasks assessed/performed      Past Medical History  Diagnosis Date  . GERD (gastroesophageal reflux disease)   . Insomnia   . Depression   . Hyperlipidemia   . ADHD (attention deficit hyperactivity disorder)   . Arthritis     Past Surgical History  Procedure Laterality Date  . Foot surgery  2006    Right foot , secondary to severe loss of joint [facility])    There were no vitals filed for this visit.  Visit Diagnosis:  Low back pain, non-specific  Muscle weakness (generalized)      Subjective Assessment - 02/16/15 1038    Subjective Patient reports she is stiff in her back this morning. She continues to have increased back pain and left sided pain that is intermittend with pulling in her posterior upper thigh. She did some cleaning on her porch this weekend with increased pain in back that is resolving today.   Limitations Standing;Sitting;Walking   How long can you sit comfortably?  2 hours   How long can you stand comfortably? no more than 1 hour and then has to sit   How long can you walk comfortably? about an hour   Patient Stated Goals To have less pain and be stronger in order to be able to perform  household chores and work in yard with less rest periods and less difficulty   Currently in Pain? 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 Standing cable lat pull downs with 15# x 15 reps, seated scapular rows double UE 15# x 15 reps and single arm 10# x 15 reps each with tactile cues to engage lower trapezius and to position shoulders in good alignment, sitting on stability ball for scapular rows high and low 15# each, rhythmic stabilization for flexion/extension x 15 reps with manual resistance/cues x 15 reps up to 5 second holds, TRX plank with push ups with guidance for correct arm positioning and trunk posture 10-15 reps, TRX chair squats/with scapular adduction x 15 reps, leg press 45#, 55# 65# each 10-15 reps with verbal cues and no ball between knees, total gym squats without ball today with  10# added for strengthening 2 x 15 reps. Independent with NuStep (unbilled) x 10 min. @ #4 workload Patient improved posture with verbal cues and demonstrationand was able to perform exercises with less fatigue than previous session       PT Education - 02/16/15 1055    Education provided Yes   Education Details educated in positioning for plank and push ups on TRX, performing exercises with alternating muscle groups to not fatigue or overuse ony one group so she can exercise more effectively.    Person(s) Educated Patient   Methods Explanation;Demonstration;Verbal cues   Comprehension Verbalized understanding;Returned  demonstration;Verbal cues required             PT Long Term Goals - 02/11/15 1512    PT LONG TERM GOAL #1   Title Patient will report pain level 3/10 max. on NRPS for lower back/ LE    Time 6   Period Weeks   Status On-going   PT LONG TERM GOAL #2   Title Patient will be independent with home program for self management of advanced exercises, posture awareness    Time 6   Period Weeks   Status On-going   PT LONG TERM GOAL #3   Title Patient will demonstrate proper posture, ergonomics and body mechanics with all DL's for return to household tasks with minimal limitation    Time 6   Period Weeks   Status On-going   PT LONG TERM GOAL #4   Title Patient will demonstrate improved perceived disability on Modified Oswestry to 30% or less indicating improved function with daily tasks    Time 6   Period Weeks   Status Revised               Plan - 02/16/15 1125    Clinical Impression Statement Patient is progressing well with imporved core control and strength with decreased lower back pain at end of session. she is progressing with improving knowledge of self management of pain and home exercise. She continues to require verbal cues to complete exercises with proper posture/positining and will therefore benefit from additional physical therapy intervention in order to be able to transition to independent home program.    Pt will benefit from skilled therapeutic intervention in order to improve on the following deficits Decreased strength;Increased muscle spasms;Pain   Rehab Potential Good   Clinical Impairments Affecting Rehab Potential chronic condition,    PT Frequency 2x / week   PT Duration 6 weeks   PT Treatment/Interventions Therapeutic activities;Therapeutic exercise;Electrical Stimulation;Moist Heat;Manual techniques   PT Next Visit Plan therapeutic exercises for improving core control and strength in LE's and trunk, modalities as indicated for pain control, manual  techniques for pain control, mobility        Problem List Patient Active Problem List   Diagnosis Date Noted  . GERD (gastroesophageal reflux disease) 11/17/2014  . Statin intolerance 01/27/2013  . Routine general medical examination at a health care facility 01/27/2013  . Multinodular goiter (nontoxic) 08/11/2012  . Overweight (BMI 25.0-29.9) 08/11/2012  . Psoriatic arthritis, destructive type   . Screening for cervical cancer 08/21/2011  . Screening for breast cancer 08/21/2011  . History of colonic polyps 10/06/2008  . HYPERLIPIDEMIA 09/15/2008  . DEPRESSION 09/15/2008  . ALLERGIC RHINITIS 09/15/2008  . ECZEMA 09/15/2008  . OSTEOARTHRITIS 09/15/2008   Jomarie Longs, PT  02/17/2015, 8:32 AM  [facility] [address] [place], [address] Phone: [phone]  Fax:  [phone]

## 2015-02-19 DIAGNOSIS — M5416 Radiculopathy, lumbar region: Secondary | ICD-10-CM | POA: Diagnosis not present

## 2015-02-19 DIAGNOSIS — M5136 Other intervertebral disc degeneration, lumbar region: Secondary | ICD-10-CM | POA: Diagnosis not present

## 2015-02-23 ENCOUNTER — Encounter: Payer: Commercial Managed Care - PPO | Admitting: Physical Therapy

## 2015-03-01 ENCOUNTER — Telehealth: Payer: Self-pay | Admitting: Internal Medicine

## 2015-03-01 NOTE — Telephone Encounter (Signed)
Patient will need an office visit to schedule  EGD. I do not see any indications in her chart for direct.    She is welcome to schedule colon she is overdue from 2014.

## 2015-03-02 ENCOUNTER — Ambulatory Visit: Payer: 59 | Admitting: Physical Therapy

## 2015-03-02 ENCOUNTER — Encounter: Payer: Self-pay | Admitting: Internal Medicine

## 2015-03-02 ENCOUNTER — Encounter: Payer: Self-pay | Admitting: Physical Therapy

## 2015-03-02 DIAGNOSIS — M545 Low back pain, unspecified: Secondary | ICD-10-CM

## 2015-03-02 DIAGNOSIS — M6281 Muscle weakness (generalized): Secondary | ICD-10-CM | POA: Diagnosis not present

## 2015-03-02 DIAGNOSIS — M5136 Other intervertebral disc degeneration, lumbar region: Secondary | ICD-10-CM | POA: Diagnosis not present

## 2015-03-02 NOTE — Therapy (Signed)
[facility] [address], [place], [address] Phone: [phone]   Fax:  [phone]  Physical Therapy Treatment  Patient Details  Name: Madeline Young MRN: [id number] Date of Birth: [birth year] Referring Provider:  Leigh Aurora, MD  Encounter Date: 03/02/2015      PT End of Session - 03/02/15 1200    Visit Number 35   Number of Visits 44   Date for PT Re-Evaluation 03/16/15   Authorization Type 35   Authorization Time Period 6   PT Start Time 1040   PT Stop Time 1130   PT Time Calculation (min) 50 min   Activity Tolerance Patient tolerated treatment well   Behavior During Therapy [facility] for tasks assessed/performed      Past Medical History  Diagnosis Date  . GERD (gastroesophageal reflux disease)   . Insomnia   . Depression   . Hyperlipidemia   . ADHD (attention deficit hyperactivity disorder)   . Arthritis     Past Surgical History  Procedure Laterality Date  . Foot surgery  2006    Right foot , secondary to severe loss of joint [facility])    There were no vitals filed for this visit.  Visit Diagnosis:  Low back pain, non-specific  Muscle weakness (generalized)      Subjective Assessment - 03/02/15 1037    Subjective Patient reports she had an injection on Friday a week ago and the next day was okay. Then on Monday she drove 6 hours to [place] and had incresed pain on right side afterwards. She had pain the entire time she was there. She has pulling in her back today on right side and     Limitations Standing;Sitting;Walking   How long can you sit comfortably? 1 hour or less   How long can you stand comfortably? no more than 1 hour and then has to sit   How long can you walk comfortably? less than 30 min.   Patient Stated Goals To have less pain and be stronger in order to be able to perform  household chores and work in yard with less rest periods and less difficulty   Currently in Pain?  Yes   Pain Score 8    Pain Location Back   Pain Orientation Lower;Left   Pain Descriptors / Indicators Aching;Discomfort   Pain Type Chronic pain   Pain Onset More than a month ago   Multiple Pain Sites No      Objective: Palpation: back: left side with + spasms along paraspinal muscles and lower lumbar region with spasms bilaterally into gluteal muscles      [facility] Adult PT Treatment/Exercise - 03/02/15 1450    Modalities   Modalities Electrical Stimulation   Electrical Stimulation   Electrical Stimulation Location lumbar spine    Electrical Stimulation Parameters high volt estim. applied (4) electrodes applied to each side of lumbar spine and upper gluteal muscles   Electrical Stimulation Goals Pain  reduction of muscle spasms   Manual Therapy   Manual Therapy Soft tissue mobilization   Manual therapy comments Spasms along left lower thoracic to lumbar spine and bilateral to lower lumbar spine and  right> left upper gluteal region   Soft tissue mobilization STM : superficial and deep techniques to reduce muslces spasms performed with patient prone over one pillow and pillow under LE's and head supported      Response to treatment: significant reduction of spasms with improved soft tissue elasticity noted  along left side paraspinal muscles thoracic to lumbar spine and both gluteal muscles with decreased pain reported to 4/10 and able to transfer off treatment table ans walk with less difficulty and improved quality of movement.            PT Education - 03/02/15 1130    Education provided Yes   Education Details Educated in pain control and what to do when driving for long distances: get out and move every 45 min. to 1 hour, rest more following long drive and use ice/heat to assist with stiff and painful muslces   Person(s) Educated Patient   Methods Explanation   Comprehension Verbalized understanding             PT Long Term Goals - 02/11/15 1512    PT LONG TERM  GOAL #1   Title Patient will report pain level 3/10 max. on NRPS for lower back/ LE    Time 6   Period Weeks   Status On-going   PT LONG TERM GOAL #2   Title Patient will be independent with home program for self management of advanced exercises, posture awareness    Time 6   Period Weeks   Status On-going   PT LONG TERM GOAL #3   Title Patient will demonstrate proper posture, ergonomics and body mechanics with all DL's for return to household tasks with minimal limitation    Time 6   Period Weeks   Status On-going   PT LONG TERM GOAL #4   Title Patient will demonstrate improved perceived disability on Modified Oswestry to 30% or less indicating improved function with daily tasks    Time 6   Period Weeks   Status Revised               Plan - 03/02/15 1200    Clinical Impression Statement Patient demonstrated decreased spasms and pain with treatment. She continues with pain in back and pulling which appears to be muscle spasms. She will continue to benefit from additional physical therapy intervention for pain control, reduction of spasms and progress back into exercises as she progresses throught this increased episode of pain.    Pt will benefit from skilled therapeutic intervention in order to improve on the following deficits Decreased strength;Increased muscle spasms;Pain   Rehab Potential Good   Clinical Impairments Affecting Rehab Potential chronic condition,    PT Frequency 2x / week   PT Duration 6 weeks   PT Treatment/Interventions Therapeutic activities;Therapeutic exercise;Electrical Stimulation;Moist Heat;Manual techniques   PT Next Visit Plan therapeutic exercises for improving core control and strength in LE's and trunk, modalities as indicated for pain control, manual techniques for pain control, mobility        Problem List Patient Active Problem List   Diagnosis Date Noted  . GERD (gastroesophageal reflux disease) 11/17/2014  . Statin intolerance  01/27/2013  . Routine general medical examination at a health care facility 01/27/2013  . Multinodular goiter (nontoxic) 08/11/2012  . Overweight (BMI 25.0-29.9) 08/11/2012  . Psoriatic arthritis, destructive type   . Screening for cervical cancer 08/21/2011  . Screening for breast cancer 08/21/2011  . History of colonic polyps 10/06/2008  . HYPERLIPIDEMIA 09/15/2008  . DEPRESSION 09/15/2008  . ALLERGIC RHINITIS 09/15/2008  . ECZEMA 09/15/2008  . OSTEOARTHRITIS 09/15/2008   Jomarie Longs, PT  03/02/2015, 6:27 PM  [facility] [address], [place], [address] Phone: [phone]   Fax:  [phone]

## 2015-03-04 ENCOUNTER — Ambulatory Visit: Payer: 59 | Admitting: Physical Therapy

## 2015-03-09 ENCOUNTER — Telehealth: Payer: Self-pay | Admitting: Internal Medicine

## 2015-03-09 NOTE — Telephone Encounter (Signed)
Patient is scheduled for an office visit for 04/29/15 1:30 to discuss possible Endo.

## 2015-03-10 ENCOUNTER — Ambulatory Visit: Payer: 59 | Attending: Internal Medicine | Admitting: Physical Therapy

## 2015-03-10 DIAGNOSIS — M6281 Muscle weakness (generalized): Secondary | ICD-10-CM | POA: Diagnosis not present

## 2015-03-10 DIAGNOSIS — M48061 Spinal stenosis, lumbar region without neurogenic claudication: Secondary | ICD-10-CM

## 2015-03-10 DIAGNOSIS — M545 Low back pain, unspecified: Secondary | ICD-10-CM

## 2015-03-10 HISTORY — DX: Spinal stenosis, lumbar region without neurogenic claudication: M48.061

## 2015-03-11 ENCOUNTER — Encounter: Payer: Self-pay | Admitting: Physical Therapy

## 2015-03-11 DIAGNOSIS — L4052 Psoriatic arthritis mutilans: Secondary | ICD-10-CM | POA: Diagnosis not present

## 2015-03-11 DIAGNOSIS — L400 Psoriasis vulgaris: Secondary | ICD-10-CM | POA: Diagnosis not present

## 2015-03-11 DIAGNOSIS — L821 Other seborrheic keratosis: Secondary | ICD-10-CM | POA: Diagnosis not present

## 2015-03-11 NOTE — Therapy (Signed)
[facility] [address] [place], [address] Phone: [phone]   Fax:  [phone]  Physical Therapy Treatment  Patient Details  Name: Madeline Young MRN: [id number] Date of Birth: November 01, 1955 Referring Provider:  Leigh Aurora, MD  Encounter Date: 03/10/2015    Past Medical History  Diagnosis Date  . GERD (gastroesophageal reflux disease)   . Insomnia   . Depression   . Hyperlipidemia   . ADHD (attention deficit hyperactivity disorder)   . Arthritis     Past Surgical History  Procedure Laterality Date  . Foot surgery  2006    Right foot , secondary to severe loss of joint [facility])    There were no vitals filed for this visit.  Visit Diagnosis:  Low back pain, non-specific  Muscle weakness (generalized)        SUBJECTIVE:Patient reports pain in lower back since having injection in spine about a week ago. Pain level 5/10 today on arrival.   OBJECTIVE:  Treatment: Exercise: standing stabilization exercises at cable: lat pull downs 20# x 15 reps, seated scapular rows x 15 reps with 15#, chest press with 10# x 15 reps, TRX for assisted scapular rows/squats x 15 reps, planks with push ups x 10 reps, modified exercises for push ups at counter/wall with verbal cues and demonstration. Patient response to treatment: patient able to return demonstration of exercises with verbal cues and reported a decrease in back symptoms and pain at end of session.   CLINICAL IMPRESSION: Patient continues with chronic back pain with exacerbation of symptoms since spinal injection. She continues to require guidance and verbal cuing to perform exercises correctly and will benefit from continued physical therapay intervention to achieve goals.    PLAN: Continues for physical therapy intervention exercises 2x/week             PT Long Term Goals - 02/11/15 1512    PT LONG TERM GOAL #1   Title Patient will report  pain level 3/10 max. on NRPS for lower back/ LE    Time 6   Period Weeks   Status On-going   PT LONG TERM GOAL #2   Title Patient will be independent with home program for self management of advanced exercises, posture awareness    Time 6   Period Weeks   Status On-going   PT LONG TERM GOAL #3   Title Patient will demonstrate proper posture, ergonomics and body mechanics with all DL's for return to household tasks with minimal limitation    Time 6   Period Weeks   Status On-going   PT LONG TERM GOAL #4   Title Patient will demonstrate improved perceived disability on Modified Oswestry to 30% or less indicating improved function with daily tasks    Time 6   Period Weeks   Status Revised               Problem List Patient Active Problem List   Diagnosis Date Noted  . GERD (gastroesophageal reflux disease) 11/17/2014  . Statin intolerance 01/27/2013  . Routine general medical examination at a health care facility 01/27/2013  . Multinodular goiter (nontoxic) 08/11/2012  . Overweight (BMI 25.0-29.9) 08/11/2012  . Psoriatic arthritis, destructive type   . Screening for cervical cancer 08/21/2011  . Screening for breast cancer 08/21/2011  . History of colonic polyps 10/06/2008  . HYPERLIPIDEMIA 09/15/2008  . DEPRESSION 09/15/2008  . ALLERGIC RHINITIS 09/15/2008  . ECZEMA 09/15/2008  . OSTEOARTHRITIS 09/15/2008  Jomarie Longs PT 03/11/2015, 9:55 PM  [facility] [address] [place], [address] Phone: [phone]   Fax:  [phone]

## 2015-03-12 ENCOUNTER — Encounter: Payer: Self-pay | Admitting: Physical Therapy

## 2015-03-12 ENCOUNTER — Ambulatory Visit: Payer: 59 | Admitting: Physical Therapy

## 2015-03-12 DIAGNOSIS — M545 Low back pain, unspecified: Secondary | ICD-10-CM

## 2015-03-12 DIAGNOSIS — M6281 Muscle weakness (generalized): Secondary | ICD-10-CM

## 2015-03-12 NOTE — Therapy (Signed)
[place] [facility] [address], [place], [address] Phone: [phone]   Fax:  [phone]  Physical Therapy Treatment  Patient Details  Name: Madeline Young MRN: [id number] Date of Birth: Jul 25, 1956 Referring Provider:  Leigh Aurora, MD  Encounter Date: 03/12/2015      PT End of Session - 03/12/15 1032    Visit Number 37   Number of Visits 44   Date for PT Re-Evaluation 03/16/15   Authorization Type [id number]   Authorization Time Period 49   PT Start Time 0950   PT Stop Time 1045   PT Time Calculation (min) 55 min   Activity Tolerance Patient tolerated treatment well   Behavior During Therapy [facility] for tasks assessed/performed      Past Medical History  Diagnosis Date  . GERD (gastroesophageal reflux disease)   . Insomnia   . Depression   . Hyperlipidemia   . ADHD (attention deficit hyperactivity disorder)   . Arthritis     Past Surgical History  Procedure Laterality Date  . Foot surgery  2006    Right foot , secondary to severe loss of joint [facility])    There were no vitals filed for this visit.  Visit Diagnosis:  Low back pain, non-specific  Muscle weakness (generalized)      Subjective Assessment - 03/12/15 0957    Subjective Pain in lower back is resolving from previous    Limitations Standing;Sitting;Walking   Patient Stated Goals To have less pain and be stronger in order to be able to perform  household chores and work in yard with less rest periods and less difficulty   Currently in Pain? Yes   Pain Score 4    Pain Location Back   Pain Orientation Lower   Pain Descriptors / Indicators Aching;Tightness   Pain Type Chronic pain   Pain Onset More than a month ago   Pain Frequency Constant   Multiple Pain Sites No          [facility] Adult PT Treatment/Exercise - 03/12/15 1000    Exercises   Exercises Other Exercises   Other Exercises  Guided stabilization exercises for core  control/strength: standing lat pull downs 15# x 15 reps,reverse chin up 20#,  chest press 10# x 15 reps sitting, seated scapular rows 15# x 15 reps, leg press 55# with ball between knees to be able to perform, 25# single leg, hamsring curls 25# x 15 reps, total bym squats with ball level 10 x 25 reps, bike (independent (unbilled time )  TRX assisted squats and wall push ups x 15 reps each with verbal cuing throughout all exerccises for correct trunk alignment and shoulder alignment improved technique with verbal cuing and repetition                                            PT Education - 03/12/15 1031    Education provided Yes   Education Details Instructed in home exercises to perform with alternating exercises to balance strength and endurance   Person(s) Educated Patient   Methods Explanation   Comprehension Verbalized understanding             PT Long Term Goals - 02/11/15 1512    PT LONG TERM GOAL #1   Title Patient will report pain level 3/10 max. on NRPS for lower back/ LE  Time 6   Period Weeks   Status On-going   PT LONG TERM GOAL #2   Title Patient will be independent with home program for self management of advanced exercises, posture awareness    Time 6   Period Weeks   Status On-going   PT LONG TERM GOAL #3   Title Patient will demonstrate proper posture, ergonomics and body mechanics with all DL's for return to household tasks with minimal limitation    Time 6   Period Weeks   Status On-going   PT LONG TERM GOAL #4   Title Patient will demonstrate improved perceived disability on Modified Oswestry to 30% or less indicating improved function with daily tasks    Time 6   Period Weeks   Status Revised               Plan - 03/12/15 1053    Clinical Impression Statement Patient demonstrated improved technique with all exercises and she is requriing less verbal cuing with exercise.    Pt will benefit from skilled therapeutic intervention in  order to improve on the following deficits Decreased strength;Increased muscle spasms;Pain   Rehab Potential Good   Clinical Impairments Affecting Rehab Potential chronic condition,    PT Frequency 2x / week   PT Duration 6 weeks   PT Next Visit Plan therapeutic exercises for improving core control and strength in LE's and trunk, modalities as indicated for pain control, manual techniques for pain control, mobility        Problem List Patient Active Problem List   Diagnosis Date Noted  . GERD (gastroesophageal reflux disease) 11/17/2014  . Statin intolerance 01/27/2013  . Routine general medical examination at a health care facility 01/27/2013  . Multinodular goiter (nontoxic) 08/11/2012  . Overweight (BMI 25.0-29.9) 08/11/2012  . Psoriatic arthritis, destructive type   . Screening for cervical cancer 08/21/2011  . Screening for breast cancer 08/21/2011  . History of colonic polyps 10/06/2008  . HYPERLIPIDEMIA 09/15/2008  . DEPRESSION 09/15/2008  . ALLERGIC RHINITIS 09/15/2008  . ECZEMA 09/15/2008  . OSTEOARTHRITIS 09/15/2008    Jomarie Longs PT 03/12/2015, 11:01 AM  [place] [facility] [address], [place], [address] Phone: [phone]   Fax:  [phone]

## 2015-03-15 ENCOUNTER — Encounter: Payer: Self-pay | Admitting: Physical Therapy

## 2015-03-15 ENCOUNTER — Ambulatory Visit: Payer: 59 | Admitting: Physical Therapy

## 2015-03-15 DIAGNOSIS — M545 Low back pain, unspecified: Secondary | ICD-10-CM

## 2015-03-15 DIAGNOSIS — M6281 Muscle weakness (generalized): Secondary | ICD-10-CM | POA: Diagnosis not present

## 2015-03-16 NOTE — Therapy (Signed)
[place] [facility] [address] [place], [address] Phone: [phone]   Fax:  [phone]  Physical Therapy Treatment  Patient Details  Name: Madeline Young MRN: [id number] Date of Birth: April 01, 1956 Referring Provider:  Leigh Aurora, MD  Encounter Date: 03/15/2015      PT End of Session - 03/15/15 0959    Visit Number 38   Number of Visits 44   Authorization Type [id number]   Authorization Time Period 40   PT Start Time 0905   PT Stop Time 0955   PT Time Calculation (min) 50 min   Activity Tolerance Patient tolerated treatment well;No increased pain   Behavior During Therapy [facility] for tasks assessed/performed      Past Medical History  Diagnosis Date  . GERD (gastroesophageal reflux disease)   . Insomnia   . Depression   . Hyperlipidemia   . ADHD (attention deficit hyperactivity disorder)   . Arthritis     Past Surgical History  Procedure Laterality Date  . Foot surgery  2006    Right foot , secondary to severe loss of joint [facility])    There were no vitals filed for this visit.  Visit Diagnosis:  Low back pain, non-specific - Plan: PT plan of care cert/re-cert  Muscle weakness (generalized) - Plan: PT plan of care cert/re-cert      Subjective Assessment - 03/15/15 0911    Subjective Patient reports she still has pain in lower back with increased activity. She is benefitting from current treatment and feels that therapy continues to help with strength and flexibility with decreased pain in back.    Limitations Standing;Sitting;Walking   How long can you sit comfortably? 1 hour or less   How long can you stand comfortably? no more than 1 hour and then has to sit   How long can you walk comfortably? less than 30 min.   Patient Stated Goals To have less pain and be stronger in order to be able to perform  household chores and work in yard with less rest periods and less difficulty   Pain Score 4    Pain  Location Back   Pain Orientation Lower   Pain Descriptors / Indicators Aching;Tightness   Pain Type Chronic pain   Pain Onset More than a month ago   Pain Frequency Constant   Aggravating Factors  increased activity in general   Multiple Pain Sites No           [facility] Adult PT Treatment/Exercise - 03/15/15 0914    Exercises   Exercises Other Exercises   Other Exercises  standing lat pull downs 15# 2 sets x 15 reps, chest press sitting 2 sets 5 and 10# 15 reps, seated scapular rows 2 sets x 15 10/15#, leg press 45# and 55# 2 x 15 reps, hamsring curls 2 sets 20# and 25# x 15 reps each, total gym squats with ball between knees 20# added and 30# added 15 reps each, TRX assisted squats and plank push ups at (2) different intensities 2 sets x 15 reps each with guidance for push ups to stay within range to not stress anterior shoulder joint, walk against resistive band, dounbled forward and backwards x 10 reps each with verbal cuing, (NuStep level #4 workload x 10 min. followed by bike x 5-10 min. level #2 manual control: both unbilled)              Patient response to treatment: improved control with  body position, alignment with knees and trunk with repetition and verbal cuing, improved strength noted with increased endurance and intensity of exercises from previous session          PT Education - 03/15/15 0956    Education provided Yes   Education Details continues to require guidance vebally and with tactile cuing to perform most exercises with correct position of trunk, knees and shoulders to avoid strain/stress on joints   Person(s) Educated Patient   Methods Explanation;Demonstration;Tactile cues;Verbal cues   Comprehension Verbalized understanding;Returned demonstration;Verbal cues required;Tactile cues required             PT Long Term Goals - 03/15/15 1000    PT LONG TERM GOAL #1   Title Patient will report pain level 3/10 max. on NRPS for lower back/ LE with walking,  household chores with rest breaks by 04/26/2015   Baseline increased pain with activity due to recent exacerbation of symptoms   Status Revised   PT LONG TERM GOAL #2   Title Patient will be independent with home program for self management of advanced exercises, posture awareness by 04/26/2015   Baseline still req;uires verbal cues and guidance for correct positioning for alignment of trunk   Status Revised   PT LONG TERM GOAL #3   Title Patient will demonstrate proper posture, ergonomics and body mechanics with all DL's for return to household tasks with minimal limitation by 04/26/2015   Baseline improving   Status Partially Met   PT LONG TERM GOAL #4   Title Patient will demonstrate improved perceived disability on Modified Oswestry to 30% or less indicating improved function with daily tasks    Baseline modified oswestry score 45%   Status Revised               Plan - 03/15/15 1045    Clinical Impression Statement Patient demonstrates improvement with control of trunk for exercises, she continues with weakness in core and LE's and UE's which limits ability to perform daily tasks at home, in yard wihtout difficulty. Due to the chronic nature of her pain and recent exacerbation of symptoms in the past 2 weeks she will benefit from additional physical therapy intervention to address limitations and progress towards independent home program of exercises and self managemnt of symptoms.    Pt will benefit from skilled therapeutic intervention in order to improve on the following deficits Decreased strength;Increased muscle spasms;Pain   Rehab Potential Good   Clinical Impairments Affecting Rehab Potential chronic condition, exacerbation of symptoms of back pain following long drive within thw past 2 weeks   PT Frequency 2x / week   PT Duration 6 weeks   PT Treatment/Interventions Therapeutic activities;Therapeutic exercise;Electrical Stimulation;Moist Heat;Manual techniques   PT Next  Visit Plan therapeutic exercises for improving core control and strength in LE's and trunk, modalities as indicated for pain control, manual techniques for pain control, mobility   Consulted and Agree with Plan of Care Patient        Problem List Patient Active Problem List   Diagnosis Date Noted  . GERD (gastroesophageal reflux disease) 11/17/2014  . Statin intolerance 01/27/2013  . Routine general medical examination at a health care facility 01/27/2013  . Multinodular goiter (nontoxic) 08/11/2012  . Overweight (BMI 25.0-29.9) 08/11/2012  . Psoriatic arthritis, destructive type   . Screening for cervical cancer 08/21/2011  . Screening for breast cancer 08/21/2011  . History of colonic polyps 10/06/2008  . HYPERLIPIDEMIA 09/15/2008  . DEPRESSION 09/15/2008  . ALLERGIC  RHINITIS 09/15/2008  . ECZEMA 09/15/2008  . OSTEOARTHRITIS 09/15/2008    Jomarie Longs PT 03/16/2015, 2:57 PM  [place] [facility] [address] [place], [address] Phone: [phone]   Fax:  [phone]

## 2015-03-17 ENCOUNTER — Ambulatory Visit: Payer: 59 | Admitting: Physical Therapy

## 2015-03-22 ENCOUNTER — Encounter: Payer: 59 | Admitting: Physical Therapy

## 2015-03-23 ENCOUNTER — Encounter: Payer: Self-pay | Admitting: Physical Therapy

## 2015-03-23 ENCOUNTER — Ambulatory Visit: Payer: 59 | Admitting: Physical Therapy

## 2015-03-23 DIAGNOSIS — M6281 Muscle weakness (generalized): Secondary | ICD-10-CM | POA: Diagnosis not present

## 2015-03-23 DIAGNOSIS — M545 Low back pain, unspecified: Secondary | ICD-10-CM

## 2015-03-23 NOTE — Therapy (Signed)
[place] [facility] [address] [place], [address] Phone: [phone]   Fax:  [phone]  Physical Therapy Treatment  Patient Details  Name: Madeline Young MRN: [id number] Date of Birth: [birth year] Referring Provider:  Leigh Aurora, MD  Encounter Date: 03/23/2015      PT End of Session - 03/23/15 1236    Visit Number 39   Number of Visits 44   Date for PT Re-Evaluation 04/26/15   Authorization Type [id number]   Authorization Time Period 50   PT Start Time 1130   PT Stop Time 1210   PT Time Calculation (min) 40 min   Activity Tolerance No increased pain   Behavior During Therapy [facility] for tasks assessed/performed      Past Medical History  Diagnosis Date  . GERD (gastroesophageal reflux disease)   . Insomnia   . Depression   . Hyperlipidemia   . ADHD (attention deficit hyperactivity disorder)   . Arthritis     Past Surgical History  Procedure Laterality Date  . Foot surgery  2006    Right foot , secondary to severe loss of joint [facility])    There were no vitals filed for this visit.  Visit Diagnosis:  Low back pain, non-specific  Muscle weakness (generalized)      Subjective Assessment - 03/23/15 1231    Subjective Patient reports she still has pain in lower back with increased activity. She is benefitting from current treatment and feels that therapy continues to help with strength and flexibility with decreased pain in back.    Currently in Pain? Yes   Pain Score 4    Pain Location Back   Pain Orientation Lower   Pain Descriptors / Indicators Aching;Tightness   Pain Type Chronic pain   Pain Onset More than a month ago   Pain Frequency Constant   Multiple Pain Sites No          [facility] Adult PT Treatment/Exercise - 03/23/15 1233    Exercises   Exercises Other Exercises   Other Exercises  Stretching for low back/hamstring/ SIJ mobilzation in standing at treatment table 3 sets x 20 second holds  each LE followed by exercise: standing lat pull downs 15# 2 sets x 15 reps, chest press sitting 1 set 10# x 15 reps, seated scapular rows 2 sets x 15 15#, leg press 45# and 55# 2 x 15 reps, hamsring curls 2 sets and 25# x 15 reps each, total gym squats with ball between knees 20# added 2 x 15 reps each, TRX assisted squats and plank push ups at (2) different intensities1 set x 15 reps each with guidance for push ups to stay within range to not stress anterior shoulder joint, followed by bike x 5-10 min. level #2 manual control: unbilled)             Patient response to treatment: Patient required verbal cues for correct positioning for LE leg press/total gym squats and guided verbal cuing for posture and correct performance of eccentric component, performance improved with repetition and guidance. She reported decreased lower back and left LE tightness with bike exercise        PT Education - 03/23/15 1235    Education provided Yes   Education Details verbal cuing for correct position on LE's for exercises to not lock knees in full extension with leg press/total gym squats   Person(s) Educated Patient   Methods Explanation;Verbal cues   Comprehension Verbalized understanding;Returned demonstration;Verbal  cues required             PT Long Term Goals - 03/15/15 1000    PT LONG TERM GOAL #1   Title Patient will report pain level 3/10 max. on NRPS for lower back/ LE with walking, household chores with rest breaks by 04/26/2015   Baseline increased pain with activity due to recent exacerbation of symptoms   Status Revised   PT LONG TERM GOAL #2   Title Patient will be independent with home program for self management of advanced exercises, posture awareness by 04/26/2015   Baseline still req;uires verbal cues and guidance for correct positioning for alignment of trunk   Status Revised   PT LONG TERM GOAL #3   Title Patient will demonstrate proper posture, ergonomics and body mechanics  with all DL's for return to household tasks with minimal limitation by 04/26/2015   Baseline improving   Status Partially Met   PT LONG TERM GOAL #4   Title Patient will demonstrate improved perceived disability on Modified Oswestry to 30% or less indicating improved function with daily tasks    Baseline modified oswestry score 45%   Status Revised               Plan - 03/23/15 1237    Clinical Impression Statement Patient demonstrated improved control with repetition of exercises. Requires verbal cuing for correct technique and position during exercises. Progressing towards goals.    Pt will benefit from skilled therapeutic intervention in order to improve on the following deficits Decreased strength;Increased muscle spasms;Pain   Rehab Potential Good   PT Frequency 2x / week   PT Duration 6 weeks   PT Treatment/Interventions Therapeutic exercise;Electrical Stimulation;Moist Heat;Manual techniques   PT Next Visit Plan therapeutic exercises for improving core control and strength in LE's and trunk, modalities as indicated for pain control, manual techniques for pain control, mobility        Problem List Patient Active Problem List   Diagnosis Date Noted  . GERD (gastroesophageal reflux disease) 11/17/2014  . Statin intolerance 01/27/2013  . Routine general medical examination at a health care facility 01/27/2013  . Multinodular goiter (nontoxic) 08/11/2012  . Overweight (BMI 25.0-29.9) 08/11/2012  . Psoriatic arthritis, destructive type   . Screening for cervical cancer 08/21/2011  . Screening for breast cancer 08/21/2011  . History of colonic polyps 10/06/2008  . HYPERLIPIDEMIA 09/15/2008  . DEPRESSION 09/15/2008  . ALLERGIC RHINITIS 09/15/2008  . ECZEMA 09/15/2008  . OSTEOARTHRITIS 09/15/2008    Jomarie Longs PT 03/23/2015, 12:40 PM  [place] [facility] [address] [place], [address] Phone:  [phone]   Fax:  [phone]

## 2015-03-26 ENCOUNTER — Ambulatory Visit: Payer: 59 | Admitting: Physical Therapy

## 2015-03-26 ENCOUNTER — Encounter: Payer: Self-pay | Admitting: Physical Therapy

## 2015-03-26 ENCOUNTER — Other Ambulatory Visit: Payer: Self-pay | Admitting: Physical Medicine and Rehabilitation

## 2015-03-26 DIAGNOSIS — M6281 Muscle weakness (generalized): Secondary | ICD-10-CM | POA: Diagnosis not present

## 2015-03-26 DIAGNOSIS — M5416 Radiculopathy, lumbar region: Secondary | ICD-10-CM | POA: Diagnosis not present

## 2015-03-26 DIAGNOSIS — M545 Low back pain, unspecified: Secondary | ICD-10-CM

## 2015-03-26 DIAGNOSIS — M48062 Spinal stenosis, lumbar region with neurogenic claudication: Secondary | ICD-10-CM | POA: Insufficient documentation

## 2015-03-26 DIAGNOSIS — M5136 Other intervertebral disc degeneration, lumbar region: Secondary | ICD-10-CM | POA: Diagnosis not present

## 2015-03-26 DIAGNOSIS — M4806 Spinal stenosis, lumbar region: Secondary | ICD-10-CM | POA: Diagnosis not present

## 2015-03-26 NOTE — Therapy (Signed)
[place] [facility] [address], [place], [address] Phone: [phone]   Fax:  [phone]  Physical Therapy Treatment  Patient Details  Name: Madeline Young MRN: [id number] Date of Birth: 1955/11/23 Referring Provider:  Leigh Aurora, MD  Encounter Date: 03/26/2015      PT End of Session - 03/26/15 1050    Visit Number 40   Number of Visits 44   Date for PT Re-Evaluation 04/26/15   Authorization Type 40   Authorization Time Period 54   PT Start Time 1005   PT Stop Time 1045   PT Time Calculation (min) 40 min   Activity Tolerance Patient tolerated treatment well   Behavior During Therapy [facility] for tasks assessed/performed      Past Medical History  Diagnosis Date  . GERD (gastroesophageal reflux disease)   . Insomnia   . Depression   . Hyperlipidemia   . ADHD (attention deficit hyperactivity disorder)   . Arthritis     Past Surgical History  Procedure Laterality Date  . Foot surgery  2006    Right foot , secondary to severe loss of joint [facility])    There were no vitals filed for this visit.  Visit Diagnosis:  Low back pain, non-specific  Muscle weakness (generalized)      Subjective Assessment - 03/26/15 1014    Subjective Patient reports she still has pain in lower back with increased activity. She is benefitting from current treatment and feels that therapy continues to help with strength and flexibility with decreased pain in back. She reports she continues with pulling in back of thighs ever since infection for pain control. She is going to see Dr. Sharlet Salina for follow up next week and will discuss symptoms with him.    Limitations Standing;Sitting;Walking   Patient Stated Goals To have less pain and be stronger in order to be able to perform  household chores and work in yard with less rest periods and less difficulty   Currently in Pain? Yes   Pain Score 4    Pain Location Back   Pain  Orientation Lower   Pain Descriptors / Indicators Aching;Tightness   Pain Type Chronic pain   Pain Onset More than a month ago   Pain Frequency Intermittent   Multiple Pain Sites No           [facility] Adult PT Treatment/Exercise - 03/26/15 1015    Exercises   Exercises Other Exercises   Other Exercises   supine hook lying: shotgun technique for SIJ mobilization 2 reps (isometric hip adduction and abduction x 10 second holds), side lying SIJ mobilzation each side prior to LE exercise: standing lat pull downs 15# 2 sets x 15 reps, chest press sitting 2 sets 10# 15 reps, seated scapular rows 2 sets x 15 15#, leg press 45# and 55# 2 x 15 reps, hamsring curls 2 sets 20# and 25# x 15 reps each, total gym squats with ball between knees 2 x15 reps each, TRX assisted squats and plank push ups at (2) different intensities  x 15 reps each with guidance for push ups to stay within range to not stress anterior shoulder joint,  ( bike x 5-10 min. level #2 manual control: warm up unbilled)              Patient response to treatment: decreased lower lumbar spine/SIJ region and decreased pulling in posterior thighs with isometric hip exercises for SIJ mobilization, Patient requires verbal  cuing and guidance to perform exercises with proper alignment and control during exercises          PT Education - 2015/04/08 1043    Education provided Yes   Education Details instructed in self mobilization for SIJ, reinforced home exercises for core control/strength   Person(s) Educated Patient   Methods Explanation;Demonstration;Verbal cues   Comprehension Verbalized understanding;Returned demonstration;Verbal cues required             PT Long Term Goals - 03/15/15 1000    PT LONG TERM GOAL #1   Title Patient will report pain level 3/10 max. on NRPS for lower back/ LE with walking, household chores with rest breaks by 04/26/2015   Baseline increased pain with activity due to recent exacerbation of  symptoms   Status Revised   PT LONG TERM GOAL #2   Title Patient will be independent with home program for self management of advanced exercises, posture awareness by 04/26/2015   Baseline still req;uires verbal cues and guidance for correct positioning for alignment of trunk   Status Revised   PT LONG TERM GOAL #3   Title Patient will demonstrate proper posture, ergonomics and body mechanics with all DL's for return to household tasks with minimal limitation by 04/26/2015   Baseline improving   Status Partially Met   PT LONG TERM GOAL #4   Title Patient will demonstrate improved perceived disability on Modified Oswestry to 30% or less indicating improved function with daily tasks    Baseline modified oswestry score 45%   Status Revised               Plan - 04-08-2015 1054    Clinical Impression Statement Patient demonstrated decreased low back pain and pulling/tightness in both LE's/buttocks with mobilization of SIJ's. She is improving knowledge of self management of exercises for home and continues to require verbal cues for correct positioning of LE's and UE's during core stabilization and leg press exercises.    Pt will benefit from skilled therapeutic intervention in order to improve on the following deficits Decreased strength;Increased muscle spasms;Pain   Rehab Potential Good   PT Frequency 2x / week   PT Duration 6 weeks   PT Treatment/Interventions Therapeutic exercise;Electrical Stimulation;Moist Heat;Manual techniques   PT Next Visit Plan therapeutic exercises for improving core control and strength in LE's and trunk, modalities as indicated for pain control, manual techniques for pain control, mobility          G-Codes - Apr 08, 2015 1055    Functional Limitation Mobility: Walking and moving around   Mobility: Walking and Moving Around Current Status [phone]) At least 40 percent but less than 60 percent impaired, limited or restricted   Mobility: Walking and Moving Around  Goal Status [phone]) At least 20 percent but less than 40 percent impaired, limited or restricted      Problem List Patient Active Problem List   Diagnosis Date Noted  . GERD (gastroesophageal reflux disease) 11/17/2014  . Statin intolerance 01/27/2013  . Routine general medical examination at a health care facility 01/27/2013  . Multinodular goiter (nontoxic) 08/11/2012  . Overweight (BMI 25.0-29.9) 08/11/2012  . Psoriatic arthritis, destructive type   . Screening for cervical cancer 08/21/2011  . Screening for breast cancer 08/21/2011  . History of colonic polyps 10/06/2008  . HYPERLIPIDEMIA 09/15/2008  . DEPRESSION 09/15/2008  . ALLERGIC RHINITIS 09/15/2008  . ECZEMA 09/15/2008  . OSTEOARTHRITIS 09/15/2008    Jomarie Longs PT 04/08/15, 10:19 PM  [facility] [address] [place], [address] Phone: [phone]   Fax:  [phone]

## 2015-03-29 ENCOUNTER — Ambulatory Visit: Payer: 59 | Admitting: Physical Therapy

## 2015-03-29 ENCOUNTER — Encounter: Payer: Self-pay | Admitting: Physical Therapy

## 2015-03-29 DIAGNOSIS — M6281 Muscle weakness (generalized): Secondary | ICD-10-CM | POA: Diagnosis not present

## 2015-03-29 DIAGNOSIS — M545 Low back pain, unspecified: Secondary | ICD-10-CM

## 2015-03-29 NOTE — Therapy (Signed)
[place] [facility] [address], [place], [address] Phone: [phone]   Fax:  [phone]  Physical Therapy Treatment  Patient Details  Name: Madeline Young MRN: [id number] Date of Birth: August 14, 1956 Referring Provider:  Leigh Aurora, MD  Encounter Date: 03/29/2015      PT End of Session - 03/29/15 1124    Visit Number 41   Number of Visits 56   Date for PT Re-Evaluation 04/26/15   Authorization Type 41   Authorization Time Period 50   PT Start Time 1046   PT Stop Time 1132   PT Time Calculation (min) 46 min   Activity Tolerance Patient tolerated treatment well;No increased pain   Behavior During Therapy [facility] for tasks assessed/performed      Past Medical History  Diagnosis Date  . GERD (gastroesophageal reflux disease)   . Insomnia   . Depression   . Hyperlipidemia   . ADHD (attention deficit hyperactivity disorder)   . Arthritis     Past Surgical History  Procedure Laterality Date  . Foot surgery  2006    Right foot , secondary to severe loss of joint [facility])    There were no vitals filed for this visit.  Visit Diagnosis:  Low back pain, non-specific  Muscle weakness (generalized)      Subjective Assessment - 03/29/15 1051    Subjective Patient reports seeing MD and began round of Prednisone taper this weekend, 03/27/2015   Currently in Pain? Yes   Pain Score 4    Pain Location Back   Pain Orientation Lower   Pain Descriptors / Indicators Aching;Tightness   Pain Type Chronic pain   Pain Onset More than a month ago   Pain Frequency Intermittent   Multiple Pain Sites No          [facility] Adult PT Treatment/Exercise - 03/29/15 1052    Exercises   Exercises Other Exercises   Other Exercises  supine hook lying, hip flexion with resistive band x 10 each, hip abduction 2 x 10 double and single leg control with verbal cuing, shotgun for SIJ mobilzation, standing lat pull downs 20# 1 sets x 8  repps and one set 15# x 15 reps, chest press sitting 2 sets 10 with 10#,  seated scapular rows 2 sets x 15 15#, leg press 45# and 55# 2 x 15 reps, and 1 set 65# x 10 reps, total gym squats with ball between knees 2 x 25 reps each, TRX assisted squats x 15 reps each with guidance (recumbent bike x 10 min. level #3 manual control:unbilled)              Patient response to treatment: improved control of core and LE's with exercises with verbal cuing, no increased lower back pain reported during treatment, continues with left buttock soreness and pulling sensation intermittently, decreased verbal cuing this session as compared to previous session needed during exercises          PT Education - 03/29/15 1123    Education provided Yes   Education Details verbal cuing for correct performance of leg press and total gym squats for 90 degrees at knees and no locking knees at full extension   Person(s) Educated Patient   Methods Explanation;Verbal cues   Comprehension Verbalized understanding;Returned demonstration;Verbal cues required             PT Long Term Goals - 03/15/15 1000    PT LONG TERM GOAL #1  Title Patient will report pain level 3/10 max. on NRPS for lower back/ LE with walking, household chores with rest breaks by 04/26/2015   Baseline increased pain with activity due to recent exacerbation of symptoms   Status Revised   PT LONG TERM GOAL #2   Title Patient will be independent with home program for self management of advanced exercises, posture awareness by 04/26/2015   Baseline still req;uires verbal cues and guidance for correct positioning for alignment of trunk   Status Revised   PT LONG TERM GOAL #3   Title Patient will demonstrate proper posture, ergonomics and body mechanics with all DL's for return to household tasks with minimal limitation by 04/26/2015   Baseline improving   Status Partially Met   PT LONG TERM GOAL #4   Title Patient will demonstrate improved  perceived disability on Modified Oswestry to 30% or less indicating improved function with daily tasks    Baseline modified oswestry score 45%   Status Revised               Plan - 03/29/15 1125    Clinical Impression Statement Patient tolerated treatment well without increased lower back pain and requires continued verbal cuing for correct positioning of LE's and core during most exercises to decreased strain on joints. She is currently on prednisone taper and this seesm to be helping with keeping pain under control during exercises/daily tasks   Pt will benefit from skilled therapeutic intervention in order to improve on the following deficits Decreased strength;Increased muscle spasms;Pain   Rehab Potential Good   Clinical Impairments Affecting Rehab Potential chronic condition, exacerbation of symptoms of back pain following long drive within thw past 2 weeks   PT Frequency 2x / week   PT Duration 6 weeks   PT Treatment/Interventions Therapeutic exercise;Electrical Stimulation;Moist Heat;Manual techniques   PT Next Visit Plan therapeutic exercises for improving core control and strength in LE's and trunk, modalities as indicated for pain control, manual techniques for pain control, mobility        Problem List Patient Active Problem List   Diagnosis Date Noted  . GERD (gastroesophageal reflux disease) 11/17/2014  . Statin intolerance 01/27/2013  . Routine general medical examination at a health care facility 01/27/2013  . Multinodular goiter (nontoxic) 08/11/2012  . Overweight (BMI 25.0-29.9) 08/11/2012  . Psoriatic arthritis, destructive type   . Screening for cervical cancer 08/21/2011  . Screening for breast cancer 08/21/2011  . History of colonic polyps 10/06/2008  . HYPERLIPIDEMIA 09/15/2008  . DEPRESSION 09/15/2008  . ALLERGIC RHINITIS 09/15/2008  . ECZEMA 09/15/2008  . OSTEOARTHRITIS 09/15/2008    Jomarie Longs PT 03/29/2015, 11:34 AM  [place] [facility] [address], [place], [address] Phone: [phone]   Fax:  [phone]

## 2015-03-31 ENCOUNTER — Encounter: Payer: Medicare Other | Admitting: Physical Therapy

## 2015-04-02 ENCOUNTER — Ambulatory Visit: Payer: 59

## 2015-04-05 ENCOUNTER — Encounter: Payer: 59 | Admitting: Physical Therapy

## 2015-04-06 ENCOUNTER — Encounter: Payer: 59 | Admitting: Physical Therapy

## 2015-04-06 ENCOUNTER — Encounter: Payer: Self-pay | Admitting: Physical Therapy

## 2015-04-06 ENCOUNTER — Ambulatory Visit: Payer: 59 | Admitting: Physical Therapy

## 2015-04-06 DIAGNOSIS — M545 Low back pain, unspecified: Secondary | ICD-10-CM

## 2015-04-06 DIAGNOSIS — M6281 Muscle weakness (generalized): Secondary | ICD-10-CM

## 2015-04-06 NOTE — Therapy (Signed)
[place] [facility] [address], [place], [address] Phone: [phone]   Fax:  [phone]  Physical Therapy Treatment  Patient Details  Name: Madeline Young MRN: [id number] Date of Birth: [birth year] Referring Provider:  Leigh Aurora, MD  Encounter Date: 04/06/2015      PT End of Session - 04/06/15 1200    Visit Number 42   Number of Visits 56   Date for PT Re-Evaluation 04/26/15   Authorization Type 42   Authorization Time Period 77   PT Start Time 1037   PT Stop Time 1135   PT Time Calculation (min) 58 min   Activity Tolerance Patient tolerated treatment well;Patient limited by pain   Behavior During Therapy [facility] for tasks assessed/performed      Past Medical History  Diagnosis Date  . GERD (gastroesophageal reflux disease)   . Insomnia   . Depression   . Hyperlipidemia   . ADHD (attention deficit hyperactivity disorder)   . Arthritis     Past Surgical History  Procedure Laterality Date  . Foot surgery  2006    Right foot , secondary to severe loss of joint [facility])    There were no vitals filed for this visit.  Visit Diagnosis:  Low back pain, non-specific  Muscle weakness (generalized)      Subjective Assessment - 04/06/15 1043    Subjective Patient reports she is doing well even after driving for 2 hours. She is having more right sided lower back discomfort today.    Limitations Standing;Sitting;Walking   Patient Stated Goals To have less pain and be stronger in order to be able to perform  household chores and work in yard with less rest periods and less difficulty   Currently in Pain? Yes   Pain Score 3    Pain Location Back   Pain Orientation Lower   Pain Descriptors / Indicators Aching   Pain Type Chronic pain   Pain Onset More than a month ago   Multiple Pain Sites No            [facility] Adult PT Treatment/Exercise - 04/06/15 1045    Exercises   Exercises Other Exercises    Other Exercises  supine hook lying, hip flexion with resistive band x 10 each, hip abduction 1 x 10 double, side lying clam with green resistive band x 10 reps each side, prone stabilization opposite arm/leg raise with tactile and verbal cues to maintain core control,  standing lat pull downs 20# 1 sets x 10 reps, chest press sitting 2 sets 10 with 10#,  seated scapular rows 1 sets x 15 15#, reverse chin up with lat bar 15# x 15 reps, leg press 45# and 55# 2 x 15 reps, total gym squats with ball between knees 2 x 25 reps each, TRX assisted squats x 10 reps    Modalities   Modalities Cryotherapy;Electrical Stimulation   Cryotherapy   Number Minutes Cryotherapy 20 Minutes   Cryotherapy Location Lumbar Spine   Type of Cryotherapy Ice pack   Electrical Stimulation   Electrical Stimulation Location lumbar spine    Electrical Stimulation Parameters high volt estim. applied (4) electrodes to each side of lower lumbar spine with patient prone lying over one pillow   Electrical Stimulation Goals Pain  reduction of muscle spasms      Patient response to treatment: increased pain with stabilization prone lying with raising right LE into extension, decreased core control and noted weakness in  left side lower back during prone exercises. Patient required verbal cuing to perform exercises with correct alignment and technique, better following estim. At end of session with reported decreased right lower back spasms          PT Education - 04/06/15 1150    Education provided Yes   Education Details instruction in core stabilization: side lying clam, prone extension of LE with opposite UE raise with verbal cues, hooklying TrA contraciton with hip and knee flexion with green resistive band around thighs   Person(s) Educated Patient   Methods Explanation;Verbal cues   Comprehension Verbalized understanding;Returned demonstration;Verbal cues required             PT Long Term Goals - 03/15/15 1000     PT LONG TERM GOAL #1   Title Patient will report pain level 3/10 max. on NRPS for lower back/ LE with walking, household chores with rest breaks by 04/26/2015   Baseline increased pain with activity due to recent exacerbation of symptoms   Status Revised   PT LONG TERM GOAL #2   Title Patient will be independent with home program for self management of advanced exercises, posture awareness by 04/26/2015   Baseline still req;uires verbal cues and guidance for correct positioning for alignment of trunk   Status Revised   PT LONG TERM GOAL #3   Title Patient will demonstrate proper posture, ergonomics and body mechanics with all DL's for return to household tasks with minimal limitation by 04/26/2015   Baseline improving   Status Partially Met   PT LONG TERM GOAL #4   Title Patient will demonstrate improved perceived disability on Modified Oswestry to 30% or less indicating improved function with daily tasks    Baseline modified oswestry score 45%   Status Revised               Plan - 04/06/15 1150    Clinical Impression Statement Patient tolerated treatment with increased right sided back pain with new prone stabilization exercise due to decreased control of core. She required verbal cues and guidance to perform most exercises with good technique and to maintain core control.    Pt will benefit from skilled therapeutic intervention in order to improve on the following deficits Decreased strength;Increased muscle spasms;Pain   Rehab Potential Good   PT Frequency 2x / week   PT Duration 6 weeks   PT Treatment/Interventions Therapeutic exercise;Electrical Stimulation;Moist Heat;Manual techniques;Patient/family education   PT Next Visit Plan therapeutic exercises for improving core control and strength in LE's and trunk, modalities as indicated for pain control, manual techniques for pain control, mobility        Problem List Patient Active Problem List   Diagnosis Date Noted  .  GERD (gastroesophageal reflux disease) 11/17/2014  . Statin intolerance 01/27/2013  . Routine general medical examination at a health care facility 01/27/2013  . Multinodular goiter (nontoxic) 08/11/2012  . Overweight (BMI 25.0-29.9) 08/11/2012  . Psoriatic arthritis, destructive type   . Screening for cervical cancer 08/21/2011  . Screening for breast cancer 08/21/2011  . History of colonic polyps 10/06/2008  . HYPERLIPIDEMIA 09/15/2008  . DEPRESSION 09/15/2008  . ALLERGIC RHINITIS 09/15/2008  . ECZEMA 09/15/2008  . OSTEOARTHRITIS 09/15/2008    Jomarie Longs PT 04/06/2015, 4:18 PM  [place] [facility] [address] [place], [address] Phone: [phone]   Fax:  [phone]

## 2015-04-07 ENCOUNTER — Encounter: Payer: 59 | Admitting: Physical Therapy

## 2015-04-07 ENCOUNTER — Ambulatory Visit
Admission: RE | Admit: 2015-04-07 | Discharge: 2015-04-07 | Disposition: A | Discharge status: Home / Self Care | Payer: 59 | Source: Ambulatory Visit | Attending: Physical Medicine and Rehabilitation | Admitting: Physical Medicine and Rehabilitation

## 2015-04-07 DIAGNOSIS — M5416 Radiculopathy, lumbar region: Secondary | ICD-10-CM | POA: Diagnosis present

## 2015-04-07 DIAGNOSIS — M5126 Other intervertebral disc displacement, lumbar region: Secondary | ICD-10-CM | POA: Diagnosis not present

## 2015-04-07 DIAGNOSIS — M4316 Spondylolisthesis, lumbar region: Secondary | ICD-10-CM | POA: Diagnosis not present

## 2015-04-07 DIAGNOSIS — M5386 Other specified dorsopathies, lumbar region: Secondary | ICD-10-CM | POA: Insufficient documentation

## 2015-04-07 DIAGNOSIS — M47896 Other spondylosis, lumbar region: Secondary | ICD-10-CM | POA: Diagnosis not present

## 2015-04-07 DIAGNOSIS — M4806 Spinal stenosis, lumbar region: Secondary | ICD-10-CM | POA: Diagnosis not present

## 2015-04-08 ENCOUNTER — Encounter: Payer: 59 | Admitting: Physical Therapy

## 2015-04-09 ENCOUNTER — Encounter: Payer: Self-pay | Admitting: Physical Therapy

## 2015-04-09 ENCOUNTER — Ambulatory Visit: Payer: 59 | Attending: Internal Medicine | Admitting: Physical Therapy

## 2015-04-09 DIAGNOSIS — M545 Low back pain, unspecified: Secondary | ICD-10-CM

## 2015-04-09 DIAGNOSIS — M6281 Muscle weakness (generalized): Secondary | ICD-10-CM | POA: Diagnosis not present

## 2015-04-09 NOTE — Therapy (Signed)
[facility] [address], [place], [address] Phone: [phone]   Fax:  [phone]  Physical Therapy Treatment  Patient Details  Name: Madeline Young MRN: [id number] Date of Birth: 09/18/1956 Referring Provider:  Leigh Aurora, MD  Encounter Date: 04/09/2015      PT End of Session - 04/09/15 1223    Visit Number 43   Number of Visits 56   Date for PT Re-Evaluation 04/26/15   Authorization Type 43   Authorization Time Period 47   PT Start Time 1133   PT Stop Time 1215   PT Time Calculation (min) 42 min   Activity Tolerance Patient tolerated treatment well;Patient limited by pain   Behavior During Therapy [facility] for tasks assessed/performed      Past Medical History  Diagnosis Date  . GERD (gastroesophageal reflux disease)   . Insomnia   . Depression   . Hyperlipidemia   . ADHD (attention deficit hyperactivity disorder)   . Arthritis     Past Surgical History  Procedure Laterality Date  . Foot surgery  2006    Right foot , secondary to severe loss of joint [facility])    There were no vitals filed for this visit.  Visit Diagnosis:  Low back pain, non-specific  Muscle weakness (generalized)      Subjective Assessment - 04/09/15 1136    Subjective Patient reports she is doing well today with back pain.    Limitations Standing;Sitting;Walking   Patient Stated Goals To have less pain and be stronger in order to be able to perform  household chores and work in yard with less rest periods and less difficulty   Currently in Pain? Yes   Pain Score 3    Pain Location Back   Pain Orientation Lower   Pain Descriptors / Indicators Aching   Pain Type Chronic pain   Pain Onset More than a month ago   Multiple Pain Sites No          [facility] Adult PT Treatment/Exercise - 04/09/15 0001    Exercises   Exercises Other Exercises   Other Exercises  close supervision with verbal cuing for correct alingment  of trunk/LE's during exercise: leg press 45# and 55# x 15 reps each with ball between knees for proper alingment, total gym squats 2 sets x 25 reps with ball between knees for proper alignment, hamstring curls at [employer] cable machine 3 x 15 reps 20# resistance, seated scapular row 20# x 15 reps, standiing lat pull down for core stabilization with 20# x 15 reps, seated reverse chin ups with 20# 2 x 15 reps, side lying clam x 20 reps each LE, prone stabilization 3 reps for opposite arm/leg raise with verbal cues for correct alignement and to avoid rotation of spine, standing at wall for plank and side planks 10 second holds with demonstration and verbal cuing                               Patient response to treatment: demonstrated improved core control with good alignment without increased back symptoms today,, required verbal cuing and demonstration for planks and clam/stabilizaiotn on treatment mat            PT Education - 04/09/15 1139    Education provided Yes   Education Details reinforced/re instructed in side lying clam, prone extension of LE/opposite UE and wall planks with verbal cues  Person(s) Educated Patient   Methods Explanation;Verbal cues;Demonstration   Comprehension Verbalized understanding;Returned demonstration;Verbal cues required             PT Long Term Goals - 03/15/15 1000    PT LONG TERM GOAL #1   Title Patient will report pain level 3/10 max. on NRPS for lower back/ LE with walking, household chores with rest breaks by 04/26/2015   Baseline increased pain with activity due to recent exacerbation of symptoms   Status Revised   PT LONG TERM GOAL #2   Title Patient will be independent with home program for self management of advanced exercises, posture awareness by 04/26/2015   Baseline still req;uires verbal cues and guidance for correct positioning for alignment of trunk   Status Revised   PT LONG TERM GOAL #3   Title Patient will demonstrate proper  posture, ergonomics and body mechanics with all DL's for return to household tasks with minimal limitation by 04/26/2015   Baseline improving   Status Partially Met   PT LONG TERM GOAL #4   Title Patient will demonstrate improved perceived disability on Modified Oswestry to 30% or less indicating improved function with daily tasks    Baseline modified oswestry score 45%   Status Revised               Plan - 04/09/15 1224    Clinical Impression Statement Patient demonstrated good technique and was able to perform all exercises without incresaed lower back pain/symptoms today. She is improving in knowledge of correct alignment of trunk/LE's during exercises and continues to benefit from physical therpay intervention to progress core exercises appropriately without exacerbation of symptoms.    Pt will benefit from skilled therapeutic intervention in order to improve on the following deficits Decreased strength;Increased muscle spasms;Pain   Rehab Potential Good   PT Frequency 2x / week   PT Duration 6 weeks   PT Treatment/Interventions Therapeutic exercise;Electrical Stimulation;Moist Heat;Manual techniques;Patient/family education   PT Next Visit Plan therapeutic exercises for improving core control and strength in LE's and trunk, modalities as indicated for pain control, manual techniques for pain control, mobility        Problem List Patient Active Problem List   Diagnosis Date Noted  . GERD (gastroesophageal reflux disease) 11/17/2014  . Statin intolerance 01/27/2013  . Routine general medical examination at a health care facility 01/27/2013  . Multinodular goiter (nontoxic) 08/11/2012  . Overweight (BMI 25.0-29.9) 08/11/2012  . Psoriatic arthritis, destructive type   . Screening for cervical cancer 08/21/2011  . Screening for breast cancer 08/21/2011  . History of colonic polyps 10/06/2008  . HYPERLIPIDEMIA 09/15/2008  . DEPRESSION 09/15/2008  . ALLERGIC RHINITIS  09/15/2008  . ECZEMA 09/15/2008  . OSTEOARTHRITIS 09/15/2008    Jomarie Longs PT 04/09/2015, 12:28 PM  [facility] [address] [place], [address] Phone: [phone]   Fax:  [phone]

## 2015-04-13 ENCOUNTER — Encounter: Payer: Self-pay | Admitting: Physical Therapy

## 2015-04-13 ENCOUNTER — Ambulatory Visit: Payer: 59 | Admitting: Physical Therapy

## 2015-04-13 DIAGNOSIS — M545 Low back pain, unspecified: Secondary | ICD-10-CM

## 2015-04-13 DIAGNOSIS — M6281 Muscle weakness (generalized): Secondary | ICD-10-CM

## 2015-04-13 NOTE — Therapy (Signed)
[facility] [address], [place], [address] Phone: [phone]   Fax:  [phone]  Physical Therapy Treatment  Patient Details  Name: Madeline Young MRN: [id number] Date of Birth: 11-20-1955 Referring Provider:  Leigh Aurora, MD  Encounter Date: 04/13/2015      PT End of Session - 04/13/15 1106    Visit Number 44   Number of Visits 56   Date for PT Re-Evaluation 04/26/15   Authorization Type 44   Authorization Time Period PT start time PT stop time PT time calculation (min. 50 1010 1050 40 min.      Past Medical History  Diagnosis Date  . GERD (gastroesophageal reflux disease)   . Insomnia   . Depression   . Hyperlipidemia   . ADHD (attention deficit hyperactivity disorder)   . Arthritis     Past Surgical History  Procedure Laterality Date  . Foot surgery  2006    Right foot , secondary to severe loss of joint [facility])    There were no vitals filed for this visit.  Visit Diagnosis:  Low back pain, non-specific  Muscle weakness (generalized)      Subjective Assessment - 04/13/15 1105    Subjective Patient reports she is doing well today with back pain. She had an MRI last week and is seeing MD in about a week for consult regarding findings. She is still having increased pain with increased activity.   Limitations Standing;Sitting;Walking   Currently in Pain? Yes   Pain Score/location 5 : lower back, aching and sore, better than usual          [facility] Adult PT Treatment/Exercise - 04/13/15 1105    Exercises   Exercises Other Exercises   Other Exercises  standing lat pull downs 15# 1 sets x 15 reps, chest press sitting 1 sets 15 with 10#,  seated scapular rows 2 sets x 15 15#, leg press 45# and 55# 2 x 15 reps, hamsring curls 2 sets 25# x 15 reps each, total gym squats with ball between knees 2 x 25 reps each, TRX assisted squats and plank push ups at (2) different intensities 1 sets x  15 reps each with guidance for push ups to stay within range to not stress anterior shoulder joint,   (NuStep level #4 workload x 10 min.  unbilled)                                Patient response to treatment: no increased pain reported, required guidance for correct posture/intensity of exercises with most exercises           PT Education - 04/13/15 1105    Education provided Yes   Education Details reinforced home program   Person(s) Educated Patient   Methods Explanation   Comprehension Verbalized understanding             PT Long Term Goals - 03/15/15 1000    PT LONG TERM GOAL #1   Title Patient will report pain level 3/10 max. on NRPS for lower back/ LE with walking, household chores with rest breaks by 04/26/2015   Baseline increased pain with activity due to recent exacerbation of symptoms   Status Revised   PT LONG TERM GOAL #2   Title Patient will be independent with home program for self management of advanced exercises, posture awareness by 04/26/2015   Baseline still req;uires verbal  cues and guidance for correct positioning for alignment of trunk   Status Revised   PT LONG TERM GOAL #3   Title Patient will demonstrate proper posture, ergonomics and body mechanics with all DL's for return to household tasks with minimal limitation by 04/26/2015   Baseline improving   Status Partially Met   PT LONG TERM GOAL #4   Title Patient will demonstrate improved perceived disability on Modified Oswestry to 30% or less indicating improved function with daily tasks    Baseline modified oswestry score 45%   Status Revised               Plan - 04/13/15 1321    Clinical Impression Statement Patient is progressing well with exercise program and improving knowledge of appropriate monitoring of pain and core control with exercises. She continues with chronic pain in back that limits function with daily activities and should continue to improve strength and control  pain with additional physical therapy intervention.    Rehab Potential Good   PT Frequency 2x / week   PT Duration 6 weeks   PT Treatment/Interventions Therapeutic exercise;Electrical Stimulation;Moist Heat;Manual techniques;Patient/family education   PT Next Visit Plan therapeutic exercises for improving core control and strength in LE's and trunk, modalities as indicated for pain control, manual techniques for pain control, mobility        Problem List Patient Active Problem List   Diagnosis Date Noted  . GERD (gastroesophageal reflux disease) 11/17/2014  . Statin intolerance 01/27/2013  . Routine general medical examination at a health care facility 01/27/2013  . Multinodular goiter (nontoxic) 08/11/2012  . Overweight (BMI 25.0-29.9) 08/11/2012  . Psoriatic arthritis, destructive type   . Screening for cervical cancer 08/21/2011  . Screening for breast cancer 08/21/2011  . History of colonic polyps 10/06/2008  . HYPERLIPIDEMIA 09/15/2008  . DEPRESSION 09/15/2008  . ALLERGIC RHINITIS 09/15/2008  . ECZEMA 09/15/2008  . OSTEOARTHRITIS 09/15/2008    Jomarie Longs PT 04/13/2015, 1:26 PM  [facility] [facility] [address] [place], [address] Phone: [phone]   Fax:  [phone]

## 2015-04-15 ENCOUNTER — Encounter: Payer: Self-pay | Admitting: Physical Therapy

## 2015-04-15 ENCOUNTER — Ambulatory Visit: Payer: 59 | Admitting: Physical Therapy

## 2015-04-15 DIAGNOSIS — M6281 Muscle weakness (generalized): Secondary | ICD-10-CM

## 2015-04-15 DIAGNOSIS — M545 Low back pain, unspecified: Secondary | ICD-10-CM

## 2015-04-15 NOTE — Therapy (Signed)
[facility] [address], [place], [address] Phone: [phone]   Fax:  [phone]  Physical Therapy Treatment  Patient Details  Name: Madeline Young MRN: [id number] Date of Birth: 10/10/1955 Referring Provider:  Leigh Aurora, MD  Encounter Date: 04/15/2015      PT End of Session - 04/15/15 1039    Visit Number 45   Number of Visits 36   Date for PT Re-Evaluation 04/26/15   Authorization Type 45   Authorization Time Period 81   PT Start Time 1010   PT Stop Time 1055   PT Time Calculation (min) 45 min   Activity Tolerance Patient tolerated treatment well   Behavior During Therapy [facility] for tasks assessed/performed      Past Medical History  Diagnosis Date  . GERD (gastroesophageal reflux disease)   . Insomnia   . Depression   . Hyperlipidemia   . ADHD (attention deficit hyperactivity disorder)   . Arthritis   . 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 Foot surgery  2006    Right foot , secondary to severe loss of joint ([facility]  . Colonoscopy w/ polypectomy      There were no vitals filed for this visit.  Visit Diagnosis:  Low back pain, non-specific  Muscle weakness (generalized)      Subjective Assessment - 04/15/15 1021    Subjective Patient reports she is doing well today with back pain.    Limitations Standing   Currently in Pain? Yes   Pain Score 4    Pain Location Back   Pain Orientation Lower   Pain Descriptors / Indicators Aching   Pain Type Chronic pain   Pain Onset More than a month ago   Pain Frequency Intermittent   Multiple Pain Sites No           [facility] Adult PT Treatment/Exercise - 04/15/15 1023    Exercises   Exercises Other Exercises   Other Exercises  supine hook lying, hip abduction 2 x 10 double,side lying clam x 10 reps with green resistive band around thighs,  standing lat pull downs 20# 1 sets x 10 reps,  chest press sitting x 10 with 10#,  seated scapular rows 2 sets x 15 15#, seated reverse chin ups 15# 2 x 15 reps,  leg press 45# and 55# 2 x 15 reps, hamsring curls 2 sets 25# x 15 reps each, total gym squats with ball between knees 2 x 25 reps each, (NuStep level #4 workload x 10 min. : unbilled)                                Patient response to treatmfent: improved control with all exercises without increased pain reported, minimal cuing required to perform exercise with good technique          PT Education - 04/15/15 1038    Education provided Yes   Education Details Patient re educated in side lying clam exercise with green resistive band   Person(s) Educated Patient   Methods Explanation   Comprehension Verbalized understanding             PT Long Term Goals - 03/15/15 1000    PT LONG TERM GOAL #1   Title Patient will report pain level 3/10 max. on NRPS for lower back/ LE with walking,  household chores with rest breaks by 04/26/2015   Baseline increased pain with activity due to recent exacerbation of symptoms   Status Revised   PT LONG TERM GOAL #2   Title Patient will be independent with home program for self management of advanced exercises, posture awareness by 04/26/2015   Baseline still req;uires verbal cues and guidance for correct positioning for alignment of trunk   Status Revised   PT LONG TERM GOAL #3   Title Patient will demonstrate proper posture, ergonomics and body mechanics with all DL's for return to household tasks with minimal limitation by 04/26/2015   Baseline improving   Status Partially Met   PT LONG TERM GOAL #4   Title Patient will demonstrate improved perceived disability on Modified Oswestry to 30% or less indicating improved function with daily tasks    Baseline modified oswestry score 45%   Status Revised               Plan - 04/15/15 1050    Clinical Impression Statement Patient is progressing well with exercises for core  control and strength. She is doing well and requires minimal verbal cuing for most exercises. She should continue to progress with physical therapy intervention with anticipated transition to independent home program over the next few weeks.    Pt will benefit from skilled therapeutic intervention in order to improve on the following deficits Decreased strength;Increased muscle spasms;Pain   Rehab Potential Good   PT Frequency 2x / week   PT Duration 6 weeks   PT Treatment/Interventions Therapeutic exercise;Electrical Stimulation;Moist Heat;Manual techniques;Patient/family education   PT Next Visit Plan therapeutic exercises for improving core control and strength in LE's and trunk, modalities as indicated for pain control, manual techniques for pain control, mobility        Problem List Patient Active Problem List   Diagnosis Date Noted  . GERD (gastroesophageal reflux disease) 11/17/2014  . Statin intolerance 01/27/2013  . Routine general medical examination at a health care facility 01/27/2013  . Multinodular goiter (nontoxic) 08/11/2012  . Overweight (BMI 25.0-29.9) 08/11/2012  . Psoriatic arthritis, destructive type   . Screening for cervical cancer 08/21/2011  . Screening for breast cancer 08/21/2011  . History of colonic polyps 10/06/2008  . HYPERLIPIDEMIA 09/15/2008  . DEPRESSION 09/15/2008  . ALLERGIC RHINITIS 09/15/2008  . ECZEMA 09/15/2008  . OSTEOARTHRITIS 09/15/2008    Jomarie Longs PT 04/15/2015, 10:23 PM  [facility] [address] [place], [address] Phone: [phone]   Fax:  [phone]

## 2015-04-16 ENCOUNTER — Telehealth: Payer: Self-pay | Admitting: *Deleted

## 2015-04-16 NOTE — Telephone Encounter (Signed)
       Should cancel previsit and the colonoscopy slot so we can schedule a double if that is what she will need   Could keep colon slot but likely no way to do egd and colonoscopy that day and if needs an egd would need to come back       Previous Messages     ----- Message -----   From: Laverna Peace, RN   Sent: 04/14/2015  1:31 PM    To: Gatha Mayer, MD   Dr. Carlean Purl,   This pt is scheduled for a PV on 04-21-15 for her colonoscopy on 05-04-15. She is scheduled to see you in the office on 04-29-15 to possible add an EGD to her colonoscopy, from her telephone encounter on 03-09-15. Is there any reason I cannot just have her skip her PV and just see you in the office-if determined colonoscopy only needed, instructions could be given then? If she is to have colonoscopy only, she can still keep her original 05-04-15 appointment. If needs endo/colon, it would have to be on another day, because you are 100 percent full on 05-04-15. Just trying to save her an extra trip that's all.   Thanks,  [employer]

## 2015-04-16 NOTE — Telephone Encounter (Signed)
Pt notified that [place] for 7/13 cancelled and to keep OV appt on 7/21

## 2015-04-20 ENCOUNTER — Ambulatory Visit: Payer: 59 | Admitting: Physical Therapy

## 2015-04-20 ENCOUNTER — Encounter: Payer: Self-pay | Admitting: Physical Therapy

## 2015-04-20 DIAGNOSIS — M545 Low back pain, unspecified: Secondary | ICD-10-CM

## 2015-04-20 DIAGNOSIS — M6281 Muscle weakness (generalized): Secondary | ICD-10-CM

## 2015-04-20 NOTE — Therapy (Signed)
[facility] [address], [place], [address] Phone: [phone]   Fax:  [phone]  Physical Therapy Treatment  Patient Details  Name: Madeline Young MRN: [id number] Date of Birth: 1956/07/03 Referring Provider:  Leigh Aurora, MD  Encounter Date: 04/20/2015      PT End of Session - 04/20/15 1100    Visit Number 71   Number of Visits 56   Date for PT Re-Evaluation 04/26/15   Authorization Type 46   Authorization Time Period 2   PT Start Time 1013   PT Stop Time 1100   PT Time Calculation (min) 47 min   Activity Tolerance Patient tolerated treatment well   Behavior During Therapy [facility] for tasks assessed/performed      Past Medical History  Diagnosis Date  . GERD (gastroesophageal reflux disease)   . Insomnia   . Depression   . Hyperlipidemia   . ADHD (attention deficit hyperactivity disorder)   . Arthritis   . 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 Foot surgery  2006    Right foot , secondary to severe loss of joint ([facility]  . Colonoscopy w/ polypectomy      There were no vitals filed for this visit.  Visit Diagnosis:  Muscle weakness (generalized)  Low back pain, non-specific      Subjective Assessment - 04/20/15 1013    Subjective Patient reports she is doing well today with back pain. Patient reports she did more over the weekend and felt increased back pain and today is feeling better.    Currently in Pain? Yes   Pain Score 4   Pain ranges from 4/10 up to 8/10 with aggravating activities   Pain Location Back   Pain Orientation Lower   Pain Descriptors / Indicators Aching   Pain Type Chronic pain   Pain Onset More than a month ago   Pain Frequency Constant   Multiple Pain Sites No            [facility] Adult PT Treatment/Exercise - 04/20/15 1017    Exercises   Exercises Other Exercises   Other Exercises  Guided exercises  with verbal cues and assistance for correct intensity and technique: standing lat pull downs 20# 1 sets x 8 repps and one set 15# x 15 reps, chest press sitting 2 sets 10 with 10#,  seated scapular rows 2 sets x 15 15#, leg press 45# and 55# 2 x 15 reps,  hamsring curls 2 sets 25# x 15 reps each, total gym squats  2 x 25 reps each, ,  (NuStep level #4 workload x 10 min and  bike x 5-10 min. level #2 manual control: both unbilled)    Patient response to treatment: Patient required assistance and guidance to perform exercises with correct intensity and technique, Patient fatigued with treatment          PT Education - 04/20/15 1100    Education provided Yes   Education Details educated patient in proper intensity of exercises to avoid incresed stress on joints and how to modify exercises dependeing on how she feels, pain, fatigue etc.    Person(s) Educated Patient   Methods Explanation   Comprehension Verbalized understanding             PT Long Term Goals - 03/15/15 1000    PT LONG TERM GOAL #1   Title Patient will report pain  level 3/10 max. on NRPS for lower back/ LE with walking, household chores with rest breaks by 04/26/2015   Baseline increased pain with activity due to recent exacerbation of symptoms   Status Revised   PT LONG TERM GOAL #2   Title Patient will be independent with home program for self management of advanced exercises, posture awareness by 04/26/2015   Baseline still req;uires verbal cues and guidance for correct positioning for alignment of trunk   Status Revised   PT LONG TERM GOAL #3   Title Patient will demonstrate proper posture, ergonomics and body mechanics with all DL's for return to household tasks with minimal limitation by 04/26/2015   Baseline improving   Status Partially Met   PT LONG TERM GOAL #4   Title Patient will demonstrate improved perceived disability on Modified Oswestry to 30% or less indicating improved function with daily tasks     Baseline modified oswestry score 45%   Status Revised               Plan - 04/20/15 1101    Clinical Impression Statement Patient is learning to be independent with home exercises and is able to do more at home. She continues with pain in back that limits ability to perform all she would like without resting. She is going for follow up with MD to discuss results of MRI and other options to help with pain.    Pt will benefit from skilled therapeutic intervention in order to improve on the following deficits Decreased strength;Increased muscle spasms;Pain   Rehab Potential Good   PT Frequency 2x / week   PT Duration 6 weeks   PT Treatment/Interventions Therapeutic exercise;Electrical Stimulation;Moist Heat;Manual techniques;Patient/family education   PT Next Visit Plan therapeutic exercises for improving core control and strength in LE's and trunk, modalities as indicated for pain control, manual techniques for pain control, mobility        Problem List Patient Active Problem List   Diagnosis Date Noted  . GERD (gastroesophageal reflux disease) 11/17/2014  . Statin intolerance 01/27/2013  . Routine general medical examination at a health care facility 01/27/2013  . Multinodular goiter (nontoxic) 08/11/2012  . Overweight (BMI 25.0-29.9) 08/11/2012  . Psoriatic arthritis, destructive type   . Screening for cervical cancer 08/21/2011  . Screening for breast cancer 08/21/2011  . History of colonic polyps 10/06/2008  . HYPERLIPIDEMIA 09/15/2008  . DEPRESSION 09/15/2008  . ALLERGIC RHINITIS 09/15/2008  . ECZEMA 09/15/2008  . OSTEOARTHRITIS 09/15/2008    Jomarie Longs PT 04/20/2015, 12:07 PM  [place] [facility] [address] [place], [address] Phone: [phone]   Fax:  [phone]

## 2015-04-22 ENCOUNTER — Encounter: Payer: Self-pay | Admitting: Physical Therapy

## 2015-04-22 ENCOUNTER — Ambulatory Visit: Payer: 59 | Admitting: Physical Therapy

## 2015-04-22 DIAGNOSIS — M545 Low back pain, unspecified: Secondary | ICD-10-CM

## 2015-04-22 DIAGNOSIS — M6281 Muscle weakness (generalized): Secondary | ICD-10-CM

## 2015-04-22 NOTE — Therapy (Signed)
[facility] [address] [place], [address] Phone: [phone]   Fax:  [phone]  Physical Therapy Treatment  Patient Details  Name: Madeline Young MRN: [id number] Date of Birth: 1956/03/23 Referring Provider:  Leigh Aurora, MD  Encounter Date: 04/22/2015      PT End of Session - 04/22/15 1100    Visit Number 14   Number of Visits 56   Date for PT Re-Evaluation 04/26/15   Authorization Type 28   Authorization Time Period 67   PT Start Time 1020   PT Stop Time 1105   PT Time Calculation (min) 45 min   Activity Tolerance Tolerated treatment well   Behavior During Therapy [facility] for tasks performed      Past Medical History  Diagnosis Date  . GERD (gastroesophageal reflux disease)   . Insomnia   . Depression   . Hyperlipidemia   . ADHD (attention deficit hyperactivity disorder)   . Arthritis   . 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 Foot surgery  2006    Right foot , secondary to severe loss of joint ([facility] [place]  . Colonoscopy w/ polypectomy      There were no vitals filed for this visit.  Visit Diagnosis:  Muscle weakness (generalized)  Low back pain, non-specific      Subjective Assessment - 04/22/15 1030    Subjective Patient reports she is much improved since beginning physical therapy. Her back continues with intermittent pain and she is learning to control her pain and manage symptoms. She is feeling stronger today.    Patient Stated Goals To have less pain and be stronger in order to be able to perform  household chores and work in yard with less rest periods and less difficulty   Currently in Pain? Yes   Pain Score 4    Pain Location Back   Pain Orientation Lower   Pain Descriptors / Indicators Aching   Pain Type Chronic pain   Pain Onset More than a month ago   Pain Frequency Constant   Aggravating Factors  increased activity  in general    Pain Relieving Factors monitors pain and activity, hot showers, medication, ice, rest   Multiple Pain Sites No      Objective: Treatment: exercises: core stability and motor control with strengthening: with verbal cuing and guidance of therapist OMEGA: standing lat pull down with 15 and 20# 2 x 15 reps, seated scapular row with tactile and verbal cues to hold position at end range 15# and 20# 2 x 15 reps, chest press with 10# 2 x 10 reps, reverse chin up 15-20# x 15 reps, controlled motion emphasized during all ex's LE's: leg press with 45#, 55# sets of 20 reps, hamstring curls with 25# 2 x 15 reps, total gym squats 2 x 25 reps, walk against double resistive band x 10 reps forward and backwards, stair master manual control x 5 min. @ level 1-2 with guidance and verbal cuing of therapist for correct technique  Patient response to treatment: fatigued with stair master, good control and able to perform all exercises with minimal cuing         PT Education - 04/22/15 1100    Education provided Yes   Education Details Reviewed home program and discussed exercises to continue at home including core control as primary exercises   Person(s) Educated Patient   Methods Explanation  Comprehension Verbalized understanding             PT Long Term Goals - 03/15/15 1000    PT LONG TERM GOAL #1   Title Patient will report pain level 3/10 max. on NRPS for lower back/ LE with walking, household chores with rest breaks by 04/26/2015   Baseline increased pain with activity due to recent exacerbation of symptoms   Status Revised   PT LONG TERM GOAL #2   Title Patient will be independent with home program for self management of advanced exercises, posture awareness by 04/26/2015   Baseline still req;uires verbal cues and guidance for correct positioning for alignment of trunk   Status Revised   PT LONG TERM GOAL #3   Title Patient will demonstrate proper posture, ergonomics and body  mechanics with all DL's for return to household tasks with minimal limitation by 04/26/2015   Baseline improving   Status Partially Met   PT LONG TERM GOAL #4   Title Patient will demonstrate improved perceived disability on Modified Oswestry to 30% or less indicating improved function with daily tasks    Baseline modified oswestry score 45%   Status Revised               Plan - 04/22/15 1103    Clinical Impression Statement Patient is progressing with exercises and knowledge of appropriate exercises to imporve strength and core control in order to be able to perform other household and outdoor chores with less stiffness and back pain.   Pt will benefit from skilled therapeutic intervention in order to improve on the following deficits Decreased strength;Increased muscle spasms;Pain   Rehab Potential Good   PT Frequency 2x / week   PT Duration 6 weeks   PT Treatment/Interventions Therapeutic exercise;Electrical Stimulation;Moist Heat;Manual techniques;Patient/family education   PT Next Visit Plan Re assess modified Oswestry, anticipate discharge        Problem List Patient Active Problem List   Diagnosis Date Noted  . GERD (gastroesophageal reflux disease) 11/17/2014  . Statin intolerance 01/27/2013  . Routine general medical examination at a health care facility 01/27/2013  . Multinodular goiter (nontoxic) 08/11/2012  . Overweight (BMI 25.0-29.9) 08/11/2012  . Psoriatic arthritis, destructive type   . Screening for cervical cancer 08/21/2011  . Screening for breast cancer 08/21/2011  . History of colonic polyps 10/06/2008  . HYPERLIPIDEMIA 09/15/2008  . DEPRESSION 09/15/2008  . ALLERGIC RHINITIS 09/15/2008  . ECZEMA 09/15/2008  . OSTEOARTHRITIS 09/15/2008    Jomarie Longs PT 04/22/2015, 11:25 PM  [facility] [address], [place], [address] Phone: [phone]   Fax:  [phone]

## 2015-04-26 ENCOUNTER — Ambulatory Visit: Payer: 59 | Admitting: Physical Therapy

## 2015-04-26 ENCOUNTER — Encounter: Payer: Self-pay | Admitting: Physical Therapy

## 2015-04-26 DIAGNOSIS — M6281 Muscle weakness (generalized): Secondary | ICD-10-CM | POA: Diagnosis not present

## 2015-04-26 DIAGNOSIS — M545 Low back pain, unspecified: Secondary | ICD-10-CM

## 2015-04-26 NOTE — Therapy (Signed)
[facility] [address] [place], [address] Phone: [phone]   Fax:  [phone]  Physical Therapy Treatment/Discharge Summary  Patient Details  Name: Madeline Young MRN: [id number] Date of Birth: 1956/02/18 Referring Provider:  Leigh Aurora, MD  Encounter Date: 04/26/2015   Patient has attended 48 sessions of physical therapy for back pain, She is independent with home program for stabilization and pain control      PT End of Session - 04/26/15 1050    Visit Number 48   Number of Visits 56   Date for PT Re-Evaluation 04/26/15   Authorization Type 48   Authorization Time Period 65   PT Start Time 1006   PT Stop Time 1050   PT Time Calculation (min) 44 min   Activity Tolerance Patient tolerated treatment well   Behavior During Therapy [facility] for tasks assessed/performed      Past Medical History  Diagnosis Date  . GERD (gastroesophageal reflux disease)   . Insomnia   . Depression   . Hyperlipidemia   . ADHD (attention deficit hyperactivity disorder)   . Arthritis   . Adenoma 10/06/2008    sigmoid 19m    Past Surgical History  Procedure Laterality Date  . Foot surgery  2006    Right foot , secondary to severe loss of joint ([facility]  . Colonoscopy w/ polypectomy      There were no vitals filed for this visit.  Visit Diagnosis:  Muscle weakness (generalized)  Low back pain, non-specific      Subjective Assessment - 04/26/15 1020    Subjective Patient reports she is much improved since beginning physical therapy. Her back continues with intermittent pain and she is learning to control her pain and manage symptoms. She is feeling stronger today. She is planning to seek further evaluation for pain and possible interventions to assist with this.    Limitations Standing   Patient Stated Goals To have less pain and be stronger in order to be able to perform  household chores and work in yard with  less rest periods and less difficulty   Currently in Pain? Yes   Pain Score --  Pain ranges from 4 up to 8/10     Objective: Outcome measure: modified Oswestry: 50% self perceived impairment AROM: lumbar spine and LE's WNL's  Strength: UE's and LE's WNL's all major muscle groups      [facility] Adult PT Treatment/Exercise - 04/26/15 1021    Exercises   Exercises Other Exercises   Other Exercises  Re assessed home exercises with written instructions and performance of exercises for corrected technique: supine hook lying, hip flexion with resistive band x 10 each, hip abduction 2 x 10 double and single leg control with verbal cuing,  standing lat pull downs 20# 1 set x 10 reps, chest press sitting 1 set of 10 with 10#,  seated scapular rows 2 sets x 15 15#, hamsring curls with resistive band, walk against resistive band, doubled forward and backwards x 10 reps each with verbal cuing, standing 4 say SLR for stabilization with verbal cuing as needed for correct technique            Patient response to treatment: independent with home program, minimal cuing required to complete tasks, improved core control with all exercises      PT Education - 04/26/15 1050    Education provided Yes   Education Details Reassessed home exercise program with demonstration, review or  written program   Person(s) Educated Patient   Methods Explanation;Demonstration;Verbal cues   Comprehension Verbalized understanding;Returned demonstration;Verbal cues required             PT Long Term Goals - 05/04/2015 1150    PT LONG TERM GOAL #1   Title Patient will report pain level 3/10 max. on NRPS for lower back/ LE with walking, household chores with rest breaks by May 04, 2015   Baseline increased pain with activity due to recent exacerbation of symptoms   Status Partially Met   PT LONG TERM GOAL #2   Title Patient will be independent with home program for self management of advanced exercises, posture awareness by  05/04/15   Baseline still req;uires verbal cues and guidance for correct positioning for alignment of trunk   Status Achieved   PT LONG TERM GOAL #3   Title Patient will demonstrate proper posture, ergonomics and body mechanics with all DL's for return to household tasks with minimal limitation by 05-04-2015   Status Achieved   PT LONG TERM GOAL #4   Title Patient will demonstrate improved perceived disability on Modified Oswestry to 30% or less indicating improved function with daily tasks    Baseline modified oswestry score 45%   Status Not Met               Plan - 2015-05-04 1050    Clinical Impression Statement Patient is independent with home program with exercises and knowledge of self management of pain control. She has not made significant changes in her pain level due to chronic pain and significant degenerative changes at multiple levels in her spine per MRI report. She has achieved maximal gains and should continue to manage symptoms independently at home.    Rehab Potential Good   PT Frequency 2x / week   PT Duration 6 weeks   PT Treatment/Interventions Therapeutic exercise;Electrical Stimulation;Moist Heat;Manual techniques;Patient/family education          G-Codes - 05/04/15 1050    Functional Assessment Tool Used modified oswestry, pain scale, clinical judgment   Functional Limitation Mobility: Walking and moving around   Mobility: Walking and Moving Around Current Status [phone]) At least 40 percent but less than 60 percent impaired, limited or restricted   Mobility: Walking and Moving Around Goal Status [phone]) At least 20 percent but less than 40 percent impaired, limited or restricted   Mobility: Walking and Moving Around Discharge Status [phone]) At least 40 percent but less than 60 percent impaired, limited or restricted      Problem List Patient Active Problem List   Diagnosis Date Noted  . GERD (gastroesophageal reflux disease) 11/17/2014  . Statin  intolerance 01/27/2013  . Routine general medical examination at a health care facility 01/27/2013  . Multinodular goiter (nontoxic) 08/11/2012  . Overweight (BMI 25.0-29.9) 08/11/2012  . Psoriatic arthritis, destructive type   . Screening for cervical cancer 08/21/2011  . Screening for breast cancer 08/21/2011  . History of colonic polyps 10/06/2008  . HYPERLIPIDEMIA 09/15/2008  . DEPRESSION 09/15/2008  . ALLERGIC RHINITIS 09/15/2008  . ECZEMA 09/15/2008  . OSTEOARTHRITIS 09/15/2008    Jomarie Longs PT 05/04/2015, 10:43 PM  [facility] [address] [place], [address] Phone: [phone]   Fax:  [phone]

## 2015-04-28 ENCOUNTER — Encounter: Payer: 59 | Admitting: Physical Therapy

## 2015-04-28 DIAGNOSIS — M5416 Radiculopathy, lumbar region: Secondary | ICD-10-CM | POA: Diagnosis not present

## 2015-04-28 DIAGNOSIS — M5126 Other intervertebral disc displacement, lumbar region: Secondary | ICD-10-CM | POA: Diagnosis not present

## 2015-04-28 DIAGNOSIS — M4806 Spinal stenosis, lumbar region: Secondary | ICD-10-CM | POA: Diagnosis not present

## 2015-04-29 ENCOUNTER — Ambulatory Visit (INDEPENDENT_AMBULATORY_CARE_PROVIDER_SITE_OTHER): Payer: 59 | Admitting: Internal Medicine

## 2015-04-29 ENCOUNTER — Encounter: Payer: Self-pay | Admitting: Internal Medicine

## 2015-04-29 VITALS — BP 100/60 | HR 72 | Ht 64.25 in | Wt 172.1 lb

## 2015-04-29 DIAGNOSIS — R198 Other specified symptoms and signs involving the digestive system and abdomen: Secondary | ICD-10-CM

## 2015-04-29 DIAGNOSIS — F458 Other somatoform disorders: Secondary | ICD-10-CM | POA: Diagnosis not present

## 2015-04-29 DIAGNOSIS — K219 Gastro-esophageal reflux disease without esophagitis: Secondary | ICD-10-CM | POA: Diagnosis not present

## 2015-04-29 DIAGNOSIS — R0989 Other specified symptoms and signs involving the circulatory and respiratory systems: Secondary | ICD-10-CM | POA: Insufficient documentation

## 2015-04-29 DIAGNOSIS — Z86010 Personal history of colonic polyps: Secondary | ICD-10-CM | POA: Diagnosis not present

## 2015-04-29 NOTE — Progress Notes (Signed)
   Subjective:    Patient ID: Madeline Young, female    DOB: 04-21-1956, 59 y.o.   MRN: [id number] Cc: lump in throat HPI Patient is here with complaints of a lump in her throat. This is been attributed to reflux in the past. She was frequent sensation of a lump there but does not have any true dysphagia. She takes a PPI regularly she Z used Zegerid or Prilosec. She is currently on Prilosec. I looked back and reviewed that she had an EGD with esophageal dilation in 2005 she had forgotten about that. That study was normal. Dr. Sharlett Iles did that. She's also seen ENT and they told her she had "silent reflux". She occasionally gets some hoarseness if she talks a lot. There is no cough. She does have some production of mucus in the morning but she uses a mouthpiece for bruxism which she thinks is related to that. She does not have sinus drainage or postnasal drip. 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 She is about to go to [place] for a month as her daughter lives there is going to have a child area she is looking forward to this. She is scheduled for a routine repeat surveillance colonoscopy next week. GI review of systems is otherwise negative. She wonders if she could come off of PPI but every time she tries she gets terrible heartburn. She cannot remember the exact reason she started it. It probably had something to do with the globus though she does not recall having a lot of classic heartburn symptomatology. Medications, allergies, past medical history, past surgical history, family history and social history are reviewed and updated in the EMR.   Review of Systems All other review of systems negative although she does admit distress over her health problems.    Objective:   Physical Exam @BP  100/60 mmHg  Pulse 72  Ht 5' 4.25" (1.632 m)  Wt 172 lb  2 oz (78.075 kg)  BMI 29.31 kg/m2@  General:  Well-developed, well-nourished and in no acute distress Eyes:  anicteric. ENT:   Mouth and posterior pharynx free of lesions.  Neck:   supple w/o thyromegaly or mass.  Lungs: Clear to auscultation bilaterally. Heart:  S1S2, no rubs, murmurs, gallops. Abdomen:  soft, non-tender, no hepatosplenomegaly, hernia, or mass and BS+.  Lymph:  no cervical or supraclavicular adenopathy. Neuro:  A&O x 3.  Psych:  appropriate mood and  Affect.   Data Reviewed: 2005 EGD/dili 2009 colonoscopy and path 04/07/2015 lumbar MRI shows spinal stenosis    Assessment & Plan:  Globus sensation for at least 10 years  Gastroesophageal reflux disease without esophagitis  Hx of adenomatous polyp of colon   1) Ba swallow + tablet to evaluate esophageal motility 2) ? Needs motility w/u beyond barium swallow 3) Consider tapering PPI  4) Surveillance colonoscopy 5) The risks and benefits as well as alternatives of endoscopic procedure(s) have been discussed and reviewed. All questions answered. The patient agrees to proceed. 6) Has multinodular goiter - i did not appreciate on exam but ? Contributing to globus ? Could need f/u ultrasound had a bx at [facility]  I appreciate the opportunity to care for this patient. CC: Crecencio Mc, MD

## 2015-04-29 NOTE — Patient Instructions (Addendum)
You have been scheduled for a colonoscopy. Please follow written instructions given to you at your visit today.  Please pick up your prep supplies at the pharmacy within the next 1-3 days. If you use inhalers (even only as needed), please bring them with you on the day of your procedure.   You have been scheduled for a Barium Esophogram at [facility]) on _8/8/16__ at _10:30AM__. Please arrive 20minutes prior to your appointment for registration. Make certain not to have anything to eat or drink 3 hours prior to your test. If you need to reschedule for any reason, please contact radiology at [phone] to do so. __________________________________________________________________ A barium swallow is an examination that concentrates on views of the esophagus. This tends to be a double contrast exam (barium and two liquids which, when combined, create a gas to distend the wall of the oesophagus) or single contrast (non-ionic iodine based). The study is usually tailored to your symptoms so a good history is essential. Attention is paid during the study to the form, structure and configuration of the esophagus, looking for functional disorders (such as aspiration, dysphagia, achalasia, motility and reflux) EXAMINATION You may be asked to change into a gown, depending on the type of swallow being performed. A radiologist and radiographer will perform the procedure. The radiologist will advise you of the type of contrast selected for your procedure and direct you during the exam. You will be asked to stand, sit or lie in several different positions and to hold a small amount of fluid in your mouth before being asked to swallow while the imaging is performed .In some instances you may be asked to swallow barium coated marshmallows to assess the motility of a solid food bolus. The exam can be recorded as a digital or video fluoroscopy procedure. POST PROCEDURE It will take 1-2 days  for the barium to pass through your system. To facilitate this, it is important, unless otherwise directed, to increase your fluids for the next 24-48hrs and to resume your normal diet.  This test typically takes about 30 minutes to perform. __________________________________________________________________________________   I appreciate the opportunity to care for you. Silvano Rusk, MD, [facility]

## 2015-05-03 DIAGNOSIS — F321 Major depressive disorder, single episode, moderate: Secondary | ICD-10-CM | POA: Diagnosis not present

## 2015-05-04 ENCOUNTER — Ambulatory Visit (AMBULATORY_SURGERY_CENTER): Payer: 59 | Admitting: Internal Medicine

## 2015-05-04 ENCOUNTER — Encounter: Payer: Self-pay | Admitting: Internal Medicine

## 2015-05-04 VITALS — BP 136/81 | HR 63 | Temp 98.3°F | Resp 19 | Ht 64.25 in | Wt 172.0 lb

## 2015-05-04 DIAGNOSIS — D122 Benign neoplasm of ascending colon: Secondary | ICD-10-CM | POA: Diagnosis not present

## 2015-05-04 DIAGNOSIS — Z86010 Personal history of colonic polyps: Secondary | ICD-10-CM

## 2015-05-04 MED ORDER — SODIUM CHLORIDE 0.9 % IV SOLN: 500.0000 mL | INTRAVENOUS | Status: DC

## 2015-05-04 NOTE — Op Note (Signed)
[facility]  [address] [place], [id number]   COLONOSCOPY PROCEDURE REPORT  PATIENT: Madeline, Young  MR#: #[id number] BIRTHDATE: [birth year] , 58  yrs. old GENDER: female ENDOSCOPIST: Gatha Mayer, MD, [facility] PROCEDURE DATE:  05/04/2015 PROCEDURE:   Colonoscopy, surveillance First Screening Colonoscopy - Avg.  risk and is 50 yrs.  old or older - No.  Prior Negative Screening - Now for repeat screening. N/A  History of Adenoma - Now for follow-up colonoscopy & has been > or = to 3 yrs.  Yes hx of adenoma.  Has been 3 or more years since last colonoscopy.  Polyps removed today? Yes ASA CLASS:   Class I INDICATIONS:Surveillance due to prior colonic neoplasia and PH Colon Adenoma. MEDICATIONS: Propofol 300 mg IV and Monitored anesthesia care  DESCRIPTION OF PROCEDURE:   After the risks benefits and alternatives of the procedure were thoroughly explained, informed consent was obtained.  The digital rectal exam revealed no abnormalities of the rectum.   The LB [id number]  endoscope was introduced through the anus and advanced to the cecum, which was identified by both the appendix and ileocecal valve. No adverse events experienced.   The quality of the prep was good.  (MiraLax was used)  The instrument was then slowly withdrawn as the colon was fully examined. Estimated blood loss is zero unless otherwise noted in this procedure report.      COLON FINDINGS: A sessile polyp measuring 3 mm in size was found in the ascending colon.  A polypectomy was performed with cold forceps.  The resection was complete, the polyp tissue was completely retrieved and sent to histology.   There was mild diverticulosis noted in the sigmoid colon.   The examination was otherwise normal.  Retroflexed views revealed no abnormalities. The time to cecum = 5.3 Withdrawal time = 9.2   The scope was withdrawn and the procedure completed. COMPLICATIONS: There were no immediate  complications.  ENDOSCOPIC IMPRESSION: 1.   Sessile polyp was found in the ascending colon; polypectomy was performed with cold forceps 2.   Mild diverticulosis was noted in the sigmoid colon 3.   The examination was otherwise normal - good prep  RECOMMENDATIONS: Timing of repeat colonoscopy will be determined by pathology findings.  Hx adenoma 2009  eSigned:  Gatha Mayer, MD, [facility] 05/04/2015 1:53 PM   cc: The Patient

## 2015-05-04 NOTE — Patient Instructions (Addendum)
I found and removed one small polyp. You also have a condition called diverticulosis - common and not usually a problem. Please read the handout provided.  I will let you know pathology results and when to have another routine colonoscopy by mail.  I appreciate the opportunity to care for you. Gatha Mayer, MD, FACG  YOU HAD AN ENDOSCOPIC PROCEDURE TODAY AT [facility]:   Refer to the procedure report that was given to you for any specific questions about what was found during the examination.  If the procedure report does not answer your questions, please call your gastroenterologist to clarify.  If you requested that your care partner not be given the details of your procedure findings, then the procedure report has been included in a sealed envelope for you to review at your convenience later.  YOU SHOULD EXPECT: Some feelings of bloating in the abdomen. Passage of more gas than usual.  Walking can help get rid of the air that was put into your GI tract during the procedure and reduce the bloating. If you had a lower endoscopy (such as a colonoscopy or flexible sigmoidoscopy) you may notice spotting of blood in your stool or on the toilet paper. If you underwent a bowel prep for your procedure, you may not have a normal bowel movement for a few days.  Please Note:  You might notice some irritation and congestion in your nose or some drainage.  This is from the oxygen used during your procedure.  There is no need for concern and it should clear up in a day or so.  SYMPTOMS TO REPORT IMMEDIATELY:   Following lower endoscopy (colonoscopy or flexible sigmoidoscopy):  Excessive amounts of blood in the stool  Significant tenderness or worsening of abdominal pains  Swelling of the abdomen that is new, acute  Fever of 100F or higher  For urgent or emergent issues, a gastroenterologist can be reached at any hour by calling [phone].   DIET: Your first meal  following the procedure should be a small meal and then it is ok to progress to your normal diet. Heavy or fried foods are harder to digest and may make you feel nauseous or bloated.  Likewise, meals heavy in dairy and vegetables can increase bloating.  Drink plenty of fluids but you should avoid alcoholic beverages for 24 hours.  ACTIVITY:  You should plan to take it easy for the rest of today and you should NOT DRIVE or use heavy machinery until tomorrow (because of the sedation medicines used during the test).    FOLLOW UP: Our staff will call the number listed on your records the next business day following your procedure to check on you and address any questions or concerns that you may have regarding the information given to you following your procedure. If we do not reach you, we will leave a message.  However, if you are feeling well and you are not experiencing any problems, there is no need to return our call.  We will assume that you have returned to your regular daily activities without incident.  If any biopsies were taken you will be contacted by phone or by letter within the next 1-3 weeks.  Please call us at [phone] if you have not heard about the biopsies in 3 weeks.    SIGNATURES/CONFIDENTIALITY: You and/or your care partner have signed paperwork which will be entered into your electronic medical record.  These signatures attest to the  fact that that the information above on your After Visit Summary has been reviewed and is understood.  Full responsibility of the confidentiality of this discharge information lies with you and/or your care-partner.  Polyps, diverticulosis-handouts given

## 2015-05-04 NOTE — Progress Notes (Signed)
Called to room to assist during endoscopic procedure.  Patient ID and intended procedure confirmed with present staff. Received instructions for my participation in the procedure from the performing physician.  

## 2015-05-04 NOTE — Progress Notes (Signed)
Transferred to recovery room. A/O x3, pleased with MAC.  VSS.  Report to April, RN. 

## 2015-05-05 ENCOUNTER — Telehealth: Payer: Self-pay | Admitting: *Deleted

## 2015-05-05 NOTE — Telephone Encounter (Signed)
No identifier, left message, follow-up  

## 2015-05-11 ENCOUNTER — Encounter: Payer: Self-pay | Admitting: Internal Medicine

## 2015-05-11 DIAGNOSIS — Z86010 Personal history of colonic polyps: Secondary | ICD-10-CM

## 2015-05-11 NOTE — Progress Notes (Signed)
Quick Note:  3 mm adenoma - hx diminutive adenoma 2009 Repeat colonoscopy 5-10 years - will send letter in 5 ______

## 2015-05-17 ENCOUNTER — Ambulatory Visit: Payer: 59

## 2015-07-07 ENCOUNTER — Ambulatory Visit: Payer: 59

## 2015-07-14 DIAGNOSIS — H43811 Vitreous degeneration, right eye: Secondary | ICD-10-CM | POA: Diagnosis not present

## 2015-07-14 DIAGNOSIS — Z961 Presence of intraocular lens: Secondary | ICD-10-CM | POA: Diagnosis not present

## 2015-07-14 DIAGNOSIS — H43812 Vitreous degeneration, left eye: Secondary | ICD-10-CM | POA: Diagnosis not present

## 2015-07-16 DIAGNOSIS — Z6828 Body mass index (BMI) 28.0-28.9, adult: Secondary | ICD-10-CM | POA: Diagnosis not present

## 2015-07-16 DIAGNOSIS — M4310 Spondylolisthesis, site unspecified: Secondary | ICD-10-CM | POA: Insufficient documentation

## 2015-07-16 DIAGNOSIS — M4316 Spondylolisthesis, lumbar region: Secondary | ICD-10-CM | POA: Diagnosis not present

## 2015-07-16 DIAGNOSIS — M545 Low back pain: Secondary | ICD-10-CM | POA: Diagnosis not present

## 2015-07-16 DIAGNOSIS — R030 Elevated blood-pressure reading, without diagnosis of hypertension: Secondary | ICD-10-CM | POA: Diagnosis not present

## 2015-07-16 DIAGNOSIS — M4806 Spinal stenosis, lumbar region: Secondary | ICD-10-CM | POA: Diagnosis not present

## 2015-07-20 DIAGNOSIS — M150 Primary generalized (osteo)arthritis: Secondary | ICD-10-CM | POA: Diagnosis not present

## 2015-07-20 DIAGNOSIS — M4806 Spinal stenosis, lumbar region: Secondary | ICD-10-CM | POA: Diagnosis not present

## 2015-07-20 DIAGNOSIS — M797 Fibromyalgia: Secondary | ICD-10-CM | POA: Diagnosis not present

## 2015-07-20 DIAGNOSIS — M2550 Pain in unspecified joint: Secondary | ICD-10-CM | POA: Diagnosis not present

## 2015-07-20 DIAGNOSIS — M5136 Other intervertebral disc degeneration, lumbar region: Secondary | ICD-10-CM | POA: Diagnosis not present

## 2015-07-28 DIAGNOSIS — F321 Major depressive disorder, single episode, moderate: Secondary | ICD-10-CM | POA: Diagnosis not present

## 2015-08-20 ENCOUNTER — Other Ambulatory Visit: Payer: Self-pay | Admitting: Internal Medicine

## 2015-10-22 DIAGNOSIS — M4186 Other forms of scoliosis, lumbar region: Secondary | ICD-10-CM | POA: Insufficient documentation

## 2015-10-22 DIAGNOSIS — M47816 Spondylosis without myelopathy or radiculopathy, lumbar region: Secondary | ICD-10-CM | POA: Insufficient documentation

## 2015-10-22 DIAGNOSIS — M4316 Spondylolisthesis, lumbar region: Secondary | ICD-10-CM | POA: Diagnosis not present

## 2015-10-22 DIAGNOSIS — M4126 Other idiopathic scoliosis, lumbar region: Secondary | ICD-10-CM | POA: Diagnosis not present

## 2015-10-22 DIAGNOSIS — M1288 Other specific arthropathies, not elsewhere classified, other specified site: Secondary | ICD-10-CM | POA: Diagnosis not present

## 2015-10-22 DIAGNOSIS — M4806 Spinal stenosis, lumbar region: Secondary | ICD-10-CM | POA: Diagnosis not present

## 2015-10-27 DIAGNOSIS — L4052 Psoriatic arthritis mutilans: Secondary | ICD-10-CM | POA: Diagnosis not present

## 2015-10-27 DIAGNOSIS — L400 Psoriasis vulgaris: Secondary | ICD-10-CM | POA: Diagnosis not present

## 2015-11-11 DIAGNOSIS — M4316 Spondylolisthesis, lumbar region: Secondary | ICD-10-CM | POA: Diagnosis not present

## 2015-11-11 DIAGNOSIS — M4806 Spinal stenosis, lumbar region: Secondary | ICD-10-CM | POA: Diagnosis not present

## 2015-11-16 ENCOUNTER — Other Ambulatory Visit: Payer: Self-pay | Admitting: Neurosurgery

## 2015-11-21 ENCOUNTER — Other Ambulatory Visit: Payer: Self-pay | Admitting: Internal Medicine

## 2015-11-23 NOTE — Telephone Encounter (Signed)
Last OV on 11/17/14, Last filled on 08/20/15 #60 with 2 refills... No future appts scheduled with you... Okay to refill?

## 2015-11-24 ENCOUNTER — Telehealth: Payer: Self-pay | Admitting: *Deleted

## 2015-11-24 DIAGNOSIS — L400 Psoriasis vulgaris: Secondary | ICD-10-CM | POA: Diagnosis not present

## 2015-11-24 DIAGNOSIS — Z79899 Other long term (current) drug therapy: Secondary | ICD-10-CM | POA: Diagnosis not present

## 2015-11-24 DIAGNOSIS — L4052 Psoriatic arthritis mutilans: Secondary | ICD-10-CM | POA: Diagnosis not present

## 2015-11-24 NOTE — Telephone Encounter (Signed)
Refill for 30 days only.  OFFICE VISIT NEEDED prior to any more refills 

## 2015-11-24 NOTE — Telephone Encounter (Signed)
This medication is pended already for refill.

## 2015-11-24 NOTE — Telephone Encounter (Signed)
Pt requested a medication refill for   celecoxib (CELEBREX) 200 MG capsule      Pharmacy [facility]

## 2015-12-02 DIAGNOSIS — M4316 Spondylolisthesis, lumbar region: Secondary | ICD-10-CM | POA: Diagnosis not present

## 2015-12-02 DIAGNOSIS — M4806 Spinal stenosis, lumbar region: Secondary | ICD-10-CM | POA: Diagnosis not present

## 2015-12-15 ENCOUNTER — Telehealth: Payer: Self-pay

## 2015-12-15 DIAGNOSIS — F321 Major depressive disorder, single episode, moderate: Secondary | ICD-10-CM | POA: Diagnosis not present

## 2015-12-15 NOTE — Telephone Encounter (Signed)
Tomorrow is my half day,  So I assume the 2:15 is with another provider ?

## 2015-12-15 NOTE — Telephone Encounter (Signed)
Reason for call: pt thinks she may have parasites in stool. Symptoms: diarrhea, feeling yucky Duration: x1 week ago Medications: no Last seen for this problem:  Seen by:  Pt states that she has some new puppies that had worms, pt feels she may have somehow gotten worms as well. Pt is requesting for stool sample to be given. Please advise, thanks

## 2015-12-15 NOTE — Telephone Encounter (Signed)
Pt scheduled to come in at 2:15 pm tomorrow. For possible parasites in stool

## 2015-12-16 ENCOUNTER — Ambulatory Visit: Payer: 59 | Admitting: Family Medicine

## 2015-12-22 ENCOUNTER — Ambulatory Visit: Payer: 59 | Admitting: Internal Medicine

## 2016-01-07 ENCOUNTER — Other Ambulatory Visit (HOSPITAL_COMMUNITY)
Admission: RE | Admit: 2016-01-07 | Discharge: 2016-01-07 | Disposition: A | Discharge status: Home / Self Care | Payer: 59 | Source: Ambulatory Visit | Attending: Internal Medicine | Admitting: Internal Medicine

## 2016-01-07 ENCOUNTER — Encounter (INDEPENDENT_AMBULATORY_CARE_PROVIDER_SITE_OTHER): Payer: Self-pay

## 2016-01-07 ENCOUNTER — Encounter: Payer: Self-pay | Admitting: Internal Medicine

## 2016-01-07 ENCOUNTER — Ambulatory Visit (INDEPENDENT_AMBULATORY_CARE_PROVIDER_SITE_OTHER): Payer: 59 | Admitting: Internal Medicine

## 2016-01-07 VITALS — BP 128/76 | HR 70 | Temp 98.0°F | Resp 12 | Ht 64.0 in | Wt 164.2 lb

## 2016-01-07 DIAGNOSIS — M129 Arthropathy, unspecified: Secondary | ICD-10-CM | POA: Diagnosis not present

## 2016-01-07 DIAGNOSIS — Z7289 Other problems related to lifestyle: Secondary | ICD-10-CM

## 2016-01-07 DIAGNOSIS — Z1239 Encounter for other screening for malignant neoplasm of breast: Secondary | ICD-10-CM

## 2016-01-07 DIAGNOSIS — Z1151 Encounter for screening for human papillomavirus (HPV): Secondary | ICD-10-CM | POA: Diagnosis not present

## 2016-01-07 DIAGNOSIS — Z0000 Encounter for general adult medical examination without abnormal findings: Secondary | ICD-10-CM | POA: Diagnosis not present

## 2016-01-07 DIAGNOSIS — M1990 Unspecified osteoarthritis, unspecified site: Secondary | ICD-10-CM | POA: Diagnosis not present

## 2016-01-07 DIAGNOSIS — L4052 Psoriatic arthritis mutilans: Secondary | ICD-10-CM

## 2016-01-07 DIAGNOSIS — Z01419 Encounter for gynecological examination (general) (routine) without abnormal findings: Secondary | ICD-10-CM | POA: Insufficient documentation

## 2016-01-07 DIAGNOSIS — Z124 Encounter for screening for malignant neoplasm of cervix: Secondary | ICD-10-CM

## 2016-01-07 LAB — C-REACTIVE PROTEIN: CRP: 0.1 mg/dL — AB (ref 0.5–20.0)

## 2016-01-07 LAB — SEDIMENTATION RATE: SED RATE: 16 mm/h (ref 0–22)

## 2016-01-07 NOTE — Progress Notes (Signed)
Patient ID: Madeline Young, female    DOB: 28-Jul-1956  Age: 60 y.o. MRN: [id number]  The patient is here for annual wellness examination and management of other chronic and acute problems.  Last seen Feb 2016 PAP done. Sexually active with husband. Mammogram needed   refuses flu vaccine Has not gotten shingles vaccine either     The risk factors are reflected in the social history.  The roster of all physicians providing medical care to patient - is listed in the Snapshot section of the chart.  Activities of daily living:  The patient is 100% independent in all ADLs: dressing, toileting, feeding as well as independent mobility  Home safety : The patient has smoke detectors in the home. They wear seatbelts.  There are no firearms at home. There is no violence in the home.   There is no risks for hepatitis, STDs or HIV. There is no   history of blood transfusion. They have no travel history to infectious disease endemic areas of the world.  The patient has seen their dentist in the last six month. They have seen their eye doctor in the last year. They admit to slight hearing difficulty with regard to whispered voices and some television programs.  They have deferred audiologic testing in the last year.  They do not  have excessive sun exposure. Discussed the need for sun protection: hats, long sleeves and use of sunscreen if there is significant sun exposure.   Diet: the importance of a healthy diet is discussed. They do have a healthy diet.  The benefits of regular aerobic exercise were discussed. She walks 4 times per week ,  20 minutes.   Depression screen: there are no signs or vegative symptoms of depression- irritability, change in appetite, anhedonia, sadness/tearfullness.  Cognitive assessment: the patient manages all their financial and personal affairs and is actively engaged. They could relate day,date,year and events; recalled 2/3 objects at 3 minutes; performed clock-face test  normally.  The following portions of the patient's history were reviewed and updated as appropriate: allergies, current medications, past family history, past medical history,  past surgical history, past social history  and problem list.  Visual acuity was not assessed per patient preference since she has regular follow up with her ophthalmologist. Hearing and body mass index were assessed and reviewed.   During the course of the visit the patient was educated and counseled about appropriate screening and preventive services including : fall prevention , diabetes screening, nutrition counseling, colorectal cancer screening, and recommended immunizations.    CC: The primary encounter diagnosis was Arthritis. 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   Severe back pain considering surgery by Madeline Young i n May Has had 2 ESI ,. 2nd one really helped  may lose insurance when husband retires  Daughter is g tting married in one month Humira did not help,  OA vs psoriatic arthritis hasn't seen Madeline Young in a while but has seen him since return from [place] 2 years ago he's still on fence re diagnosis of PSA despite positive skin biopsy  Trial of Humira x 1 month did not helpd   History Madeline Young has a past medical history of GERD (gastroesophageal reflux disease); Insomnia; Depression; Hyperlipidemia; ADHD (attention deficit hyperactivity disorder); Arthritis; Adenoma (10/06/2008); Globus sensation; Psoriatic arthritis ([place]); Spinal stenosis of lumbar region (June 2016); Multinodular goiter (nontoxic);  Statin intolerance (01/27/2013); Allergy; Anxiety; and Cataract.   She has past surgical history that includes Foot surgery (2006); Colonoscopy w/ polypectomy; Upper gastrointestinal endoscopy (2005); and Tonsillectomy.   Her  family history includes Coronary artery disease in her father; Heart disease in her mother. There is no history of Colon cancer, Esophageal cancer, Rectal cancer, or Stomach cancer.She reports that she quit smoking about 44 years ago. Her smoking use included Cigarettes. She has never used smokeless tobacco. She reports that she drinks about 2.4 oz of alcohol per week. She reports that she does not use illicit drugs.  Outpatient Prescriptions Prior to Visit  Medication Sig Dispense Refill  . aspirin 81 MG tablet Take 81 mg by mouth daily.      . celecoxib (CELEBREX) 200 MG capsule TAKE ONE CAPSULE BY MOUTH TWICE DAILY 60 capsule 0  . cholecalciferol (VITAMIN D) 1000 UNITS tablet Take 1,000 Units by mouth daily.      . diclofenac sodium (VOLTAREN) 1 % GEL Apply 1 application topically as needed.      Marland Kitchen FLUoxetine (PROZAC) 40 MG capsule Take 40 mg by mouth daily.     Marland Kitchen HYDROcodone-acetaminophen (NORCO/VICODIN) 5-325 MG per tablet Take 1 tablet by mouth every 6 (six) hours as needed for pain.    . Lactobacillus-Inulin (Martin PO) Take by mouth 1 day or 1 dose.    . lisdexamfetamine (VYVANSE) 50 MG capsule Take 30 mg by mouth daily.     . Omega-3 Fatty Acids (FISH OIL) 1200 MG CAPS Take 1 capsule by mouth 2 (two) times daily.    Marland Kitchen zolpidem (AMBIEN) 10 MG tablet Take 5 mg by mouth at bedtime as needed.     . busPIRone (BUSPAR) 30 MG tablet Take 20 mg by mouth 2 (two) times daily. Reported on 01/07/2016    . Nutritional Supplements (MELATONIN PO) Take by mouth.      Marland Kitchen omeprazole (PRILOSEC) 20 MG capsule Take 20 mg by mouth daily.     No facility-administered medications prior to visit.    Review of Systems   Patient denies headache, fevers, malaise, unintentional weight loss, skin rash, eye pain, sinus congestion and sinus pain, sore throat, dysphagia,  hemoptysis , cough, dyspnea, wheezing, chest pain, palpitations, orthopnea, edema, abdominal pain, nausea, melena, diarrhea,  constipation, flank pain, dysuria, hematuria, urinary  Frequency, nocturia, numbness, tingling, seizures,  Focal weakness, Loss of consciousness,  Tremor, insomnia, depression, anxiety, and suicidal ideation.     Objective:  BP 128/76 mmHg  Pulse 70  Temp(Src) 98 F (36.7 C) (Oral)  Resp 12  Ht 5' 4"  (1.626 m)  Wt 164 lb 4 oz (74.503 kg)  BMI 28.18 kg/m2  SpO2 98%  Physical Exam   General Appearance:    Alert, cooperative, no distress, appears stated age  Head:    Normocephalic, without obvious abnormality, atraumatic  Eyes:    PERRL, conjunctiva/corneas clear, EOM's intact, fundi    benign, both eyes  Ears:    Normal TM's and external ear canals, both ears  Nose:   Nares normal, septum midline, mucosa normal, no drainage    or sinus tenderness  Throat:   Lips, mucosa, and tongue normal; teeth and gums normal  Neck:   Supple, symmetrical, trachea midline, no adenopathy;    thyroid:  no enlargement/tenderness/nodules; no carotid   bruit or JVD  Back:     Symmetric, no curvature, ROM normal, no CVA tenderness  Lungs:     Clear to auscultation bilaterally,  respirations unlabored  Chest Wall:    No tenderness or deformity   Heart:    Regular rate and rhythm, S1 and S2 normal, no murmur, rub   or gallop  Breast Exam:    No tenderness, masses, or nipple abnormality  Abdomen:     Soft, non-tender, bowel sounds active all four quadrants,    no masses, no organomegaly  Genitalia:    Pelvic: cervix normal in appearance, external genitalia normal, no adnexal masses or tenderness, no cervical motion tenderness, rectovaginal septum normal, uterus normal size, shape, and consistency and vagina normal without discharge  Extremities:  B ilateral DIP enlargement  no cyanosis or edema  Pulses:   2+ and symmetric all extremities  Skin:   Skin color, texture, turgor normal, no rashes or lesions  Lymph nodes:   Cervical, supraclavicular, and axillary nodes normal  Neurologic:   CNII-XII intact,  normal strength, sensation and reflexes    throughout       Assessment & Plan:   Problem List Items Addressed This Visit    Psoriatic arthritis, destructive type (HCC)    Diagnosed by rheumatology with skin biopsy positive for psoriasis.  Had no improvement in Humira         Visit for preventive health examination    Annual comprehensive preventive exam was done as well as an evaluation and management of chronic conditions .  During the course of the visit the patient was educated and counseled about appropriate screening and preventive services including :  diabetes screening, lipid analysis with projected  10 year  risk for CAD , nutrition counseling, breast, cervical and colorectal cancer screening, and recommended immunizations.  Printed recommendations for health maintenance screenings was give      Screening for cervical cancer   Relevant Orders   Cytology - PAP    Other Visit Diagnoses    Arthritis    -  Primary    Relevant Orders    Sedimentation rate (Completed)    C-reactive protein (Completed)    HLA-B27 Antigen    Other problems related to lifestyle        Relevant Orders    Hepatitis C antibody    HIV antibody    HCV RNA quant (Completed)    Breast cancer screening        Relevant Orders    MM DIGITAL SCREENING BILATERAL       I have discontinued Ms. Ertle's Nutritional Supplements (MELATONIN PO) and omeprazole. I am also having her maintain her diclofenac sodium, zolpidem, aspirin, cholecalciferol, FLUoxetine, HYDROcodone-acetaminophen, busPIRone, Fish Oil, Lactobacillus-Inulin (CULTURELLE DIGESTIVE HEALTH PO), lisdexamfetamine, and celecoxib.  No orders of the defined types were placed in this encounter.    Medications Discontinued During This Encounter  Medication Reason  . Nutritional Supplements (MELATONIN PO) Error  . omeprazole (PRILOSEC) 20 MG capsule Patient Preference    Follow-up: No Follow-up on file.   Crecencio Mc, MD

## 2016-01-07 NOTE — Patient Instructions (Signed)

## 2016-01-07 NOTE — Progress Notes (Signed)
Pre-visit discussion using our clinic review tool. No additional management support is needed unless otherwise documented below in the visit note.  

## 2016-01-08 LAB — HEPATITIS C ANTIBODY: HCV Ab: NEGATIVE

## 2016-01-08 LAB — HIV ANTIBODY (ROUTINE TESTING W REFLEX): HIV 1&2 Ab, 4th Generation: NONREACTIVE

## 2016-01-09 LAB — HCV RNA QUANT: HEPATITIS C QUANTITATION: NOT DETECTED [IU]/mL

## 2016-01-09 NOTE — Assessment & Plan Note (Signed)
Annual comprehensive preventive exam was done as well as an evaluation and management of chronic conditions .  During the course of the visit the patient was educated and counseled about appropriate screening and preventive services including :  diabetes screening, lipid analysis with projected  10 year  risk for CAD , nutrition counseling, breast, cervical and colorectal cancer screening, and recommended immunizations.  Printed recommendations for health maintenance screenings was give 

## 2016-01-09 NOTE — Assessment & Plan Note (Signed)
Diagnosed by rheumatology with skin biopsy positive for psoriasis.  Had no improvement in Humira

## 2016-01-10 LAB — CYTOLOGY - PAP

## 2016-01-11 ENCOUNTER — Telehealth: Payer: Self-pay | Admitting: Internal Medicine

## 2016-01-11 ENCOUNTER — Encounter: Payer: Self-pay | Admitting: Internal Medicine

## 2016-01-11 NOTE — Telephone Encounter (Signed)
Pt dropped off an note in an envelope for Dr. Derrel Nip. Envelope is in Dr. Lupita Dawn box.

## 2016-01-13 NOTE — Telephone Encounter (Signed)
In red folder. 

## 2016-01-14 ENCOUNTER — Other Ambulatory Visit: Payer: Self-pay | Admitting: Internal Medicine

## 2016-01-14 ENCOUNTER — Telehealth: Payer: Self-pay

## 2016-01-14 LAB — HLA-B27 ANTIGEN: DNA RESULT: NEGATIVE

## 2016-01-14 MED ORDER — OMEGA-3-ACID ETHYL ESTERS 1 G PO CAPS: 2.0000 g | ORAL_CAPSULE | Freq: Two times a day (BID) | ORAL | Status: DC

## 2016-01-14 NOTE — Telephone Encounter (Signed)
PA for Omega-3 Ethyl Esteres 1 GM capsules completed over the phone, awaiting response.

## 2016-01-16 ENCOUNTER — Encounter: Payer: Self-pay | Admitting: Internal Medicine

## 2016-01-25 ENCOUNTER — Telehealth: Payer: Self-pay | Admitting: Internal Medicine

## 2016-01-25 NOTE — Telephone Encounter (Signed)
Left message for patient to return call to office. 

## 2016-01-25 NOTE — Telephone Encounter (Signed)
PA for Lovaza denied due to triglyceride level must be equal to 500 mg /dl, please advise.

## 2016-01-25 NOTE — Telephone Encounter (Signed)
Lovaza has been denied.  Her triglycereides are not high enough.  She can use fish oil 1000  Mg available OTC

## 2016-01-26 NOTE — Telephone Encounter (Signed)
she'll have to ask her pharmacist,  I do not have that info available.

## 2016-01-26 NOTE — Telephone Encounter (Signed)
Patient stated that dose of fish oil is not equal to lovaza and she would like to know the brand the amount of DHEA and the OMegas in lovaza that are equal.

## 2016-01-26 NOTE — Telephone Encounter (Signed)
Patient notified

## 2016-01-26 NOTE — Telephone Encounter (Signed)
Patient returned the call, she will be at [phone], a detailed message can be left on voicemail.

## 2016-01-27 DIAGNOSIS — M4316 Spondylolisthesis, lumbar region: Secondary | ICD-10-CM | POA: Diagnosis not present

## 2016-01-27 DIAGNOSIS — M5416 Radiculopathy, lumbar region: Secondary | ICD-10-CM | POA: Diagnosis not present

## 2016-02-09 ENCOUNTER — Encounter (HOSPITAL_COMMUNITY): Payer: Self-pay

## 2016-02-09 ENCOUNTER — Encounter (HOSPITAL_COMMUNITY)
Admission: RE | Admit: 2016-02-09 | Discharge: 2016-02-09 | Disposition: A | Discharge status: Home / Self Care | Payer: 59 | Source: Ambulatory Visit | Attending: Neurosurgery | Admitting: Neurosurgery

## 2016-02-09 DIAGNOSIS — Z01812 Encounter for preprocedural laboratory examination: Secondary | ICD-10-CM | POA: Diagnosis not present

## 2016-02-09 DIAGNOSIS — Z0183 Encounter for blood typing: Secondary | ICD-10-CM | POA: Insufficient documentation

## 2016-02-09 DIAGNOSIS — M4316 Spondylolisthesis, lumbar region: Secondary | ICD-10-CM | POA: Insufficient documentation

## 2016-02-09 HISTORY — DX: Other complications of anesthesia, initial encounter: T88.59XA

## 2016-02-09 HISTORY — DX: Adverse effect of unspecified anesthetic, initial encounter: T41.45XA

## 2016-02-09 LAB — SURGICAL PCR SCREEN
MRSA, PCR: NEGATIVE
Staphylococcus aureus: NEGATIVE

## 2016-02-09 LAB — TYPE AND SCREEN
ABO/RH(D): AB POS
Antibody Screen: NEGATIVE

## 2016-02-09 LAB — CBC
HEMATOCRIT: 37.9 % (ref 36.0–46.0)
HEMOGLOBIN: 12.4 g/dL (ref 12.0–15.0)
MCH: 32 pg (ref 26.0–34.0)
MCHC: 32.7 g/dL (ref 30.0–36.0)
MCV: 97.7 fL (ref 78.0–100.0)
Platelets: 248 10*3/uL (ref 150–400)
RBC: 3.88 MIL/uL (ref 3.87–5.11)
RDW: 13.9 % (ref 11.5–15.5)
WBC: 7.6 10*3/uL (ref 4.0–10.5)

## 2016-02-09 LAB — ABO/RH: ABO/RH(D): AB POS

## 2016-02-09 NOTE — Pre-Procedure Instructions (Signed)
    Madeline Young  [id number]      [id number] [name] [address] Phone: [phone] Fax: [phone]  [facility] [phone] [place], [address] [place] [address] Phone: [phone] Fax: [phone]    Your procedure is scheduled on Thursday, May 11.  Report to [facility] Admitting at 5:30 A.M.  Call this number if you have problems the morning of surgery:  [phone]              Any questions prior to surgery call [phone] Monday-Friday 8am-4pm   Remember:  Do not eat food or drink liquids after midnight on Wed., May 10   Take these medicines the morning of surgery with A SIP OF WATER: prozac, hydrocodone,               Stop aspirin, herbal medicines/vitamins/fish oil, celebrex and nsaids: advil, motrin, ibuprofen, aleve, BC'S,goody's on May 4   Do not wear jewelry, make-up or nail polish.  Do not wear lotions, powders, or perfumes.  You may not wear deodorant.  Do not shave 48 hours prior to surgery.  Men may shave face and neck.  Do not bring valuables to the hospital.  [facility] is not responsible for any belongings or valuables.  Contacts, dentures or bridgework may not be worn into surgery.  Leave your suitcase in the car.  After surgery it may be brought to your room.  For patients admitted to the hospital, discharge time will be determined by your treatment team.  Patients discharged the day of surgery will not be allowed to drive home.   Name and phone number of your driver:   Special instructions: review preparing for surgery handout  Please read over the following fact sheets that you were given. Pain Booklet, Coughing and Deep Breathing, Blood Transfusion Information, MRSA Information and Surgical Site Infection Prevention

## 2016-02-09 NOTE — Progress Notes (Signed)
PCP: Dr. Fara Olden @ [place] in [facility]

## 2016-02-17 ENCOUNTER — Encounter (HOSPITAL_COMMUNITY): Payer: Self-pay | Admitting: *Deleted

## 2016-02-17 ENCOUNTER — Inpatient Hospital Stay (HOSPITAL_COMMUNITY): Payer: 59 | Admitting: Certified Registered Nurse Anesthetist

## 2016-02-17 ENCOUNTER — Encounter (HOSPITAL_COMMUNITY)
Admission: RE | Disposition: A | Discharge status: Home / Self Care | Payer: Self-pay | Source: Ambulatory Visit | Attending: Neurosurgery

## 2016-02-17 ENCOUNTER — Inpatient Hospital Stay (HOSPITAL_COMMUNITY)
Admission: RE | Admit: 2016-02-17 | Discharge: 2016-02-19 | DRG: 460 | Disposition: A | Discharge status: Home / Self Care | Payer: 59 | Source: Ambulatory Visit | Attending: Neurosurgery | Admitting: Neurosurgery

## 2016-02-17 ENCOUNTER — Inpatient Hospital Stay (HOSPITAL_COMMUNITY): Payer: 59

## 2016-02-17 DIAGNOSIS — M5117 Intervertebral disc disorders with radiculopathy, lumbosacral region: Secondary | ICD-10-CM | POA: Diagnosis present

## 2016-02-17 DIAGNOSIS — M549 Dorsalgia, unspecified: Secondary | ICD-10-CM

## 2016-02-17 DIAGNOSIS — Z79899 Other long term (current) drug therapy: Secondary | ICD-10-CM | POA: Diagnosis not present

## 2016-02-17 DIAGNOSIS — M1990 Unspecified osteoarthritis, unspecified site: Secondary | ICD-10-CM | POA: Diagnosis not present

## 2016-02-17 DIAGNOSIS — M4807 Spinal stenosis, lumbosacral region: Secondary | ICD-10-CM | POA: Diagnosis present

## 2016-02-17 DIAGNOSIS — Z419 Encounter for procedure for purposes other than remedying health state, unspecified: Secondary | ICD-10-CM

## 2016-02-17 DIAGNOSIS — G4700 Insomnia, unspecified: Secondary | ICD-10-CM | POA: Diagnosis present

## 2016-02-17 DIAGNOSIS — M5116 Intervertebral disc disorders with radiculopathy, lumbar region: Secondary | ICD-10-CM | POA: Diagnosis present

## 2016-02-17 DIAGNOSIS — M4317 Spondylolisthesis, lumbosacral region: Secondary | ICD-10-CM | POA: Diagnosis present

## 2016-02-17 DIAGNOSIS — Z981 Arthrodesis status: Secondary | ICD-10-CM | POA: Diagnosis not present

## 2016-02-17 DIAGNOSIS — K219 Gastro-esophageal reflux disease without esophagitis: Secondary | ICD-10-CM | POA: Diagnosis not present

## 2016-02-17 DIAGNOSIS — M4316 Spondylolisthesis, lumbar region: Principal | ICD-10-CM | POA: Diagnosis present

## 2016-02-17 DIAGNOSIS — F329 Major depressive disorder, single episode, unspecified: Secondary | ICD-10-CM | POA: Diagnosis not present

## 2016-02-17 DIAGNOSIS — M4806 Spinal stenosis, lumbar region: Secondary | ICD-10-CM | POA: Diagnosis present

## 2016-02-17 DIAGNOSIS — Z7982 Long term (current) use of aspirin: Secondary | ICD-10-CM

## 2016-02-17 DIAGNOSIS — M5137 Other intervertebral disc degeneration, lumbosacral region: Secondary | ICD-10-CM | POA: Diagnosis not present

## 2016-02-17 DIAGNOSIS — Z791 Long term (current) use of non-steroidal anti-inflammatories (NSAID): Secondary | ICD-10-CM | POA: Diagnosis not present

## 2016-02-17 HISTORY — PX: BACK SURGERY: SHX140

## 2016-02-17 SURGERY — POSTERIOR LUMBAR FUSION 2 LEVEL
Anesthesia: General | Site: Back

## 2016-02-17 MED ORDER — ALUM & MAG HYDROXIDE-SIMETH 200-200-20 MG/5ML PO SUSP
30.0000 mL | Freq: Four times a day (QID) | ORAL | Status: DC | PRN
  Administered 2016-02-18: 30 mL via ORAL
  Filled 2016-02-17: qty 30

## 2016-02-17 MED ORDER — MIDAZOLAM HCL 2 MG/2ML IJ SOLN
INTRAMUSCULAR | Status: AC
  Filled 2016-02-17: qty 2

## 2016-02-17 MED ORDER — ZOLPIDEM TARTRATE 5 MG PO TABS
5.0000 mg | ORAL_TABLET | Freq: Every day | ORAL | Status: DC
  Administered 2016-02-17 – 2016-02-19 (×2): 5 mg via ORAL
  Filled 2016-02-17 (×2): qty 1

## 2016-02-17 MED ORDER — OXYCODONE-ACETAMINOPHEN 5-325 MG PO TABS
1.0000 | ORAL_TABLET | ORAL | Status: DC | PRN
  Administered 2016-02-17: 2 via ORAL
  Administered 2016-02-17 – 2016-02-19 (×7): 1 via ORAL
  Filled 2016-02-17: qty 1
  Filled 2016-02-17: qty 2
  Filled 2016-02-17 (×5): qty 1
  Filled 2016-02-17: qty 2

## 2016-02-17 MED ORDER — EPHEDRINE 5 MG/ML INJ
INTRAVENOUS | Status: AC
  Filled 2016-02-17: qty 10

## 2016-02-17 MED ORDER — LIDOCAINE HCL (CARDIAC) 20 MG/ML IV SOLN
INTRAVENOUS | Status: DC | PRN
  Administered 2016-02-17: 40 mg via INTRAVENOUS

## 2016-02-17 MED ORDER — CEFAZOLIN SODIUM-DEXTROSE 2-4 GM/100ML-% IV SOLN
2.0000 g | Freq: Three times a day (TID) | INTRAVENOUS | Status: AC
  Administered 2016-02-17 – 2016-02-18 (×2): 2 g via INTRAVENOUS
  Filled 2016-02-17 (×2): qty 100

## 2016-02-17 MED ORDER — LACTATED RINGERS IV SOLN: INTRAVENOUS | Status: DC

## 2016-02-17 MED ORDER — FENTANYL CITRATE (PF) 100 MCG/2ML IJ SOLN
25.0000 ug | INTRAMUSCULAR | Status: DC | PRN
  Administered 2016-02-17 (×2): 50 ug via INTRAVENOUS

## 2016-02-17 MED ORDER — ACETAMINOPHEN 325 MG PO TABS: 650.0000 mg | ORAL_TABLET | ORAL | Status: DC | PRN

## 2016-02-17 MED ORDER — ROCURONIUM BROMIDE 50 MG/5ML IV SOLN
INTRAVENOUS | Status: AC
  Filled 2016-02-17: qty 1

## 2016-02-17 MED ORDER — LIDOCAINE 2% (20 MG/ML) 5 ML SYRINGE
INTRAMUSCULAR | Status: AC
  Filled 2016-02-17: qty 5

## 2016-02-17 MED ORDER — THROMBIN 20000 UNITS EX SOLR
CUTANEOUS | Status: DC | PRN
  Administered 2016-02-17: 20 mL via TOPICAL

## 2016-02-17 MED ORDER — VANCOMYCIN HCL 1000 MG IV SOLR
INTRAVENOUS | Status: AC
  Filled 2016-02-17: qty 1000

## 2016-02-17 MED ORDER — LACTATED RINGERS IV SOLN
INTRAVENOUS | Status: DC | PRN
  Administered 2016-02-17 (×3): via INTRAVENOUS

## 2016-02-17 MED ORDER — BUPIVACAINE LIPOSOME 1.3 % IJ SUSP
20.0000 mL | Freq: Once | INTRAMUSCULAR | Status: DC
  Filled 2016-02-17: qty 20

## 2016-02-17 MED ORDER — CEFAZOLIN SODIUM-DEXTROSE 2-4 GM/100ML-% IV SOLN
INTRAVENOUS | Status: AC
  Filled 2016-02-17: qty 100

## 2016-02-17 MED ORDER — DEXTROSE 5 % IV SOLN
2.0000 g | INTRAVENOUS | Status: AC
  Administered 2016-02-17 (×2): 2 g via INTRAVENOUS
  Filled 2016-02-17: qty 20

## 2016-02-17 MED ORDER — PROPOFOL 10 MG/ML IV BOLUS
INTRAVENOUS | Status: DC | PRN
  Administered 2016-02-17: 150 mg via INTRAVENOUS

## 2016-02-17 MED ORDER — OXYCODONE HCL 5 MG PO TABS
ORAL_TABLET | ORAL | Status: AC
  Administered 2016-02-17: 5 mg via ORAL
  Filled 2016-02-17: qty 1

## 2016-02-17 MED ORDER — BACITRACIN ZINC 500 UNIT/GM EX OINT
TOPICAL_OINTMENT | CUTANEOUS | Status: DC | PRN
  Administered 2016-02-17: 1 via TOPICAL

## 2016-02-17 MED ORDER — POLYVINYL ALCOHOL 1.4 % OP SOLN: 1.0000 [drp] | Freq: Every day | OPHTHALMIC | Status: DC | PRN

## 2016-02-17 MED ORDER — 0.9 % SODIUM CHLORIDE (POUR BTL) OPTIME
TOPICAL | Status: DC | PRN
  Administered 2016-02-17: 1000 mL

## 2016-02-17 MED ORDER — KETOROLAC TROMETHAMINE 0.5 % OP SOLN
1.0000 [drp] | Freq: Four times a day (QID) | OPHTHALMIC | Status: DC
  Administered 2016-02-17 – 2016-02-18 (×3): 1 [drp] via OPHTHALMIC
  Filled 2016-02-17: qty 3

## 2016-02-17 MED ORDER — FENTANYL CITRATE (PF) 100 MCG/2ML IJ SOLN
INTRAMUSCULAR | Status: DC | PRN
  Administered 2016-02-17 (×4): 25 ug via INTRAVENOUS
  Administered 2016-02-17: 50 ug via INTRAVENOUS
  Administered 2016-02-17: 25 ug via INTRAVENOUS
  Administered 2016-02-17: 50 ug via INTRAVENOUS
  Administered 2016-02-17: 25 ug via INTRAVENOUS

## 2016-02-17 MED ORDER — BUPIVACAINE-EPINEPHRINE (PF) 0.5% -1:200000 IJ SOLN
INTRAMUSCULAR | Status: DC | PRN
  Administered 2016-02-17: 10 mL via PERINEURAL

## 2016-02-17 MED ORDER — FENTANYL CITRATE (PF) 250 MCG/5ML IJ SOLN
INTRAMUSCULAR | Status: AC
  Filled 2016-02-17: qty 5

## 2016-02-17 MED ORDER — VANCOMYCIN HCL 1000 MG IV SOLR
INTRAVENOUS | Status: DC | PRN
  Administered 2016-02-17: 1000 mg

## 2016-02-17 MED ORDER — ONDANSETRON HCL 4 MG/2ML IJ SOLN: 4.0000 mg | INTRAMUSCULAR | Status: DC | PRN

## 2016-02-17 MED ORDER — BUPIVACAINE LIPOSOME 1.3 % IJ SUSP
INTRAMUSCULAR | Status: DC | PRN
  Administered 2016-02-17: 20 mL

## 2016-02-17 MED ORDER — PHENYLEPHRINE HCL 10 MG/ML IJ SOLN
INTRAMUSCULAR | Status: AC
  Filled 2016-02-17: qty 2

## 2016-02-17 MED ORDER — ACETAMINOPHEN 650 MG RE SUPP: 650.0000 mg | RECTAL | Status: DC | PRN

## 2016-02-17 MED ORDER — ROCURONIUM BROMIDE 100 MG/10ML IV SOLN
INTRAVENOUS | Status: DC | PRN
  Administered 2016-02-17 (×2): 10 mg via INTRAVENOUS
  Administered 2016-02-17 (×2): 20 mg via INTRAVENOUS
  Administered 2016-02-17: 50 mg via INTRAVENOUS
  Administered 2016-02-17: 10 mg via INTRAVENOUS

## 2016-02-17 MED ORDER — FLUOXETINE HCL 20 MG PO CAPS
40.0000 mg | ORAL_CAPSULE | Freq: Every day | ORAL | Status: DC
  Administered 2016-02-18: 40 mg via ORAL
  Filled 2016-02-17: qty 2

## 2016-02-17 MED ORDER — HYDROCODONE-ACETAMINOPHEN 5-325 MG PO TABS: 1.0000 | ORAL_TABLET | ORAL | Status: DC | PRN

## 2016-02-17 MED ORDER — MIDAZOLAM HCL 5 MG/5ML IJ SOLN
INTRAMUSCULAR | Status: DC | PRN
  Administered 2016-02-17: 2 mg via INTRAVENOUS

## 2016-02-17 MED ORDER — PHENOL 1.4 % MT LIQD: 1.0000 | OROMUCOSAL | Status: DC | PRN

## 2016-02-17 MED ORDER — DIAZEPAM 5 MG PO TABS
5.0000 mg | ORAL_TABLET | Freq: Four times a day (QID) | ORAL | Status: DC | PRN
  Administered 2016-02-17 – 2016-02-18 (×3): 5 mg via ORAL
  Filled 2016-02-17 (×2): qty 1

## 2016-02-17 MED ORDER — EPHEDRINE SULFATE 50 MG/ML IJ SOLN
INTRAMUSCULAR | Status: DC | PRN
  Administered 2016-02-17 (×2): 10 mg via INTRAVENOUS
  Administered 2016-02-17 (×2): 5 mg via INTRAVENOUS

## 2016-02-17 MED ORDER — MORPHINE SULFATE (PF) 2 MG/ML IV SOLN: 1.0000 mg | INTRAVENOUS | Status: DC | PRN

## 2016-02-17 MED ORDER — OXYCODONE HCL 5 MG PO TABS
5.0000 mg | ORAL_TABLET | Freq: Once | ORAL | Status: AC | PRN
  Administered 2016-02-17: 5 mg via ORAL

## 2016-02-17 MED ORDER — OXYCODONE HCL 5 MG/5ML PO SOLN: 5.0000 mg | Freq: Once | ORAL | Status: AC | PRN

## 2016-02-17 MED ORDER — FENTANYL CITRATE (PF) 100 MCG/2ML IJ SOLN
INTRAMUSCULAR | Status: AC
  Administered 2016-02-17: 50 ug via INTRAVENOUS
  Filled 2016-02-17: qty 2

## 2016-02-17 MED ORDER — DIAZEPAM 5 MG PO TABS
ORAL_TABLET | ORAL | Status: AC
  Administered 2016-02-17: 5 mg via ORAL
  Filled 2016-02-17: qty 1

## 2016-02-17 MED ORDER — MENTHOL 3 MG MT LOZG: 1.0000 | LOZENGE | OROMUCOSAL | Status: DC | PRN

## 2016-02-17 MED ORDER — BISACODYL 10 MG RE SUPP: 10.0000 mg | Freq: Every day | RECTAL | Status: DC | PRN

## 2016-02-17 MED ORDER — PROPOFOL 10 MG/ML IV BOLUS
INTRAVENOUS | Status: AC
  Filled 2016-02-17: qty 20

## 2016-02-17 MED ORDER — DOCUSATE SODIUM 100 MG PO CAPS
100.0000 mg | ORAL_CAPSULE | Freq: Two times a day (BID) | ORAL | Status: DC
  Administered 2016-02-18 (×2): 100 mg via ORAL
  Filled 2016-02-17 (×2): qty 1

## 2016-02-17 MED ORDER — LISDEXAMFETAMINE DIMESYLATE 30 MG PO CAPS
30.0000 mg | ORAL_CAPSULE | Freq: Every morning | ORAL | Status: DC
  Filled 2016-02-17: qty 1

## 2016-02-17 MED ORDER — ONDANSETRON HCL 4 MG/2ML IJ SOLN: 4.0000 mg | Freq: Once | INTRAMUSCULAR | Status: DC | PRN

## 2016-02-17 MED ORDER — SODIUM CHLORIDE 0.9 % IR SOLN
Status: DC | PRN
  Administered 2016-02-17: 500 mL

## 2016-02-17 MED FILL — Sodium Chloride IV Soln 0.9%: INTRAVENOUS | Qty: 1000 | Status: AC

## 2016-02-17 MED FILL — Heparin Sodium (Porcine) Inj 1000 Unit/ML: INTRAMUSCULAR | Qty: 30 | Status: AC

## 2016-02-17 SURGICAL SUPPLY — 59 items
APL SKNCLS STERI-STRIP NONHPOA (GAUZE/BANDAGES/DRESSINGS) ×1
BAG DECANTER FOR FLEXI CONT (MISCELLANEOUS) ×2 IMPLANT
BENZOIN TINCTURE PRP APPL 2/3 (GAUZE/BANDAGES/DRESSINGS) ×2 IMPLANT
BLADE CLIPPER SURG (BLADE) IMPLANT
BRUSH SCRUB EZ PLAIN DRY (MISCELLANEOUS) ×2 IMPLANT
BUR MATCHSTICK NEURO 3.0 LAGG (BURR) ×2 IMPLANT
BUR PRECISION FLUTE 6.0 (BURR) ×2 IMPLANT
CANISTER SUCT 3000ML PPV (MISCELLANEOUS) ×2 IMPLANT
CAP REVERE LOCKING (Cap) ×6 IMPLANT
CONT SPEC 4OZ CLIKSEAL STRL BL (MISCELLANEOUS) ×2 IMPLANT
COVER BACK TABLE 60X90IN (DRAPES) ×2 IMPLANT
DRAPE C-ARM 42X72 X-RAY (DRAPES) ×4 IMPLANT
DRAPE LAPAROTOMY 100X72X124 (DRAPES) ×2 IMPLANT
DRAPE POUCH INSTRU U-SHP 10X18 (DRAPES) ×2 IMPLANT
DRAPE PROXIMA HALF (DRAPES) ×2 IMPLANT
DRAPE SURG 17X23 STRL (DRAPES) ×8 IMPLANT
ELECT BLADE 4.0 EZ CLEAN MEGAD (MISCELLANEOUS) ×2
ELECT REM PT RETURN 9FT ADLT (ELECTROSURGICAL) ×2
ELECTRODE BLDE 4.0 EZ CLN MEGD (MISCELLANEOUS) ×1 IMPLANT
ELECTRODE REM PT RTRN 9FT ADLT (ELECTROSURGICAL) ×1 IMPLANT
GAUZE SPONGE 4X4 12PLY STRL (GAUZE/BANDAGES/DRESSINGS) ×2 IMPLANT
GAUZE SPONGE 4X4 16PLY XRAY LF (GAUZE/BANDAGES/DRESSINGS) ×2 IMPLANT
GLOVE BIO SURGEON STRL SZ8 (GLOVE) ×4 IMPLANT
GLOVE BIO SURGEON STRL SZ8.5 (GLOVE) ×4 IMPLANT
GLOVE EXAM NITRILE LRG STRL (GLOVE) IMPLANT
GLOVE EXAM NITRILE MD LF STRL (GLOVE) IMPLANT
GLOVE EXAM NITRILE XL STR (GLOVE) IMPLANT
GLOVE EXAM NITRILE XS STR PU (GLOVE) IMPLANT
GOWN STRL REUS W/ TWL LRG LVL3 (GOWN DISPOSABLE) IMPLANT
GOWN STRL REUS W/ TWL XL LVL3 (GOWN DISPOSABLE) ×2 IMPLANT
GOWN STRL REUS W/TWL 2XL LVL3 (GOWN DISPOSABLE) IMPLANT
GOWN STRL REUS W/TWL LRG LVL3 (GOWN DISPOSABLE)
GOWN STRL REUS W/TWL XL LVL3 (GOWN DISPOSABLE) ×4
KIT BASIN OR (CUSTOM PROCEDURE TRAY) ×2 IMPLANT
KIT ROOM TURNOVER OR (KITS) ×2 IMPLANT
NDL HYPO 21X1.5 SAFETY (NEEDLE) IMPLANT
NEEDLE HYPO 21X1.5 SAFETY (NEEDLE) IMPLANT
NEEDLE HYPO 22GX1.5 SAFETY (NEEDLE) ×2 IMPLANT
NS IRRIG 1000ML POUR BTL (IV SOLUTION) ×2 IMPLANT
PACK LAMINECTOMY NEURO (CUSTOM PROCEDURE TRAY) ×2 IMPLANT
PAD ARMBOARD 7.5X6 YLW CONV (MISCELLANEOUS) ×6 IMPLANT
PATTIES SURGICAL .5 X1 (DISPOSABLE) IMPLANT
ROD REVERE CURVED 65MM (Rod) ×2 IMPLANT
SCREW REVERE 6.35 6.5MMX45 (Screw) ×4 IMPLANT
SCREW REVERE 6.35 7.5X40 (Screw) ×2 IMPLANT
SPACER ALTERA 10X31 8-12MM-8 (Spacer) ×2 IMPLANT
SPACER ALTERA 10X31 9-13MM-8 (Spacer) ×1 IMPLANT
SPONGE LAP 4X18 X RAY DECT (DISPOSABLE) IMPLANT
SPONGE NEURO XRAY DETECT 1X3 (DISPOSABLE) IMPLANT
SPONGE SURGIFOAM ABS GEL 100 (HEMOSTASIS) ×2 IMPLANT
STRIP BIOACTIVE 20CC 25X100X8 (Miscellaneous) ×2 IMPLANT
STRIP CLOSURE SKIN 1/2X4 (GAUZE/BANDAGES/DRESSINGS) ×2 IMPLANT
SUT VIC AB 1 CT1 18XBRD ANBCTR (SUTURE) ×2 IMPLANT
SUT VIC AB 1 CT1 8-18 (SUTURE) ×4
SUT VIC AB 2-0 CP2 18 (SUTURE) ×4 IMPLANT
TOWEL OR 17X24 6PK STRL BLUE (TOWEL DISPOSABLE) ×2 IMPLANT
TOWEL OR 17X26 10 PK STRL BLUE (TOWEL DISPOSABLE) ×2 IMPLANT
TRAY FOLEY W/METER SILVER 14FR (SET/KITS/TRAYS/PACK) ×2 IMPLANT
WATER STERILE IRR 1000ML POUR (IV SOLUTION) ×2 IMPLANT

## 2016-02-17 NOTE — Transfer of Care (Signed)
Immediate Anesthesia Transfer of Care Note  Patient: Madeline Young  Procedure(s) Performed: Procedure(s) with comments: POSTERIOR LUMBAR FUSION 2 LEVEL (N/A) - L45 L5S1 posterior lumbar interbody fusion with interbody prosthesis posterior lateral arthrodesis and posterior segmental instrumentation  Patient Location: PACU  Anesthesia Type:General  Level of Consciousness: awake, alert , oriented and patient cooperative  Airway & Oxygen Therapy: Patient Spontanous Breathing and Patient connected to nasal cannula oxygen  Post-op Assessment: Report given to RN, Post -op Vital signs reviewed and stable and Patient moving all extremities X 4  Post vital signs: Reviewed and stable  Last Vitals:  Filed Vitals:   02/17/16 0600 02/17/16 1300  BP: 129/63 132/65  Pulse: 62 90  Temp: 37.2 C 36.7 C  Resp: 18 16    Last Pain: There were no vitals filed for this visit.    Patients Stated Pain Goal: 4 ([id number])  Complications: No apparent anesthesia complications

## 2016-02-17 NOTE — Op Note (Signed)
Brief history: The patient is a 60 year old white female who has complained of back, buttock, and leg pain consistent with neurogenic claudication. She has failed medical management and was worked up with a lumbar MRI and lumbar x-rays. These demonstrated an L4-5 and L5-S1 spondylolisthesis, spinal stenosis, facet arthropathy, etc. I discussed the various treatment options with the patient including surgery. She has weighed the risks, benefits, and alternatives to surgery and decided proceed with an L4-5 and L5-S1 decompression, instrumentation, and fusion.  Preoperative diagnosis: L4-5 and L5-S1 spondylolisthesis, Degenerative disc disease, spinal stenosis compressing  the L4, L5 and the S1 nerve roots; lumbago; lumbar radiculopathy  Postoperative diagnosis: The same  Procedure: L5 laminectomy with bilateral L4-5 Laminotomy/foraminotomies to decompress the bilateral L4, L5 and S1 nerve roots(the work required to do this was in addition to the work required to do the posterior lumbar interbody fusion because of the patient's spinal stenosis, facet arthropathy. Etc. requiring a wide decompression of the nerve roots.); L4-5 and L5-S1 posterior lumbar interbody fusion with local morselized autograft bone and Kinnex graft extender; insertion of interbody prosthesis at L4-5 and L5-S1 (globus peek expandable interbody prosthesis); posterior segmental instrumentation from L4 to S1 with globus titanium pedicle screws and rods; posterior lateral arthrodesis at L4-5 and L5-S1 with local morselized autograft bone and Kinnex bone graft extender.  Surgeon: Dr. Earle Gell  Asst.: Dr. Kathyrn Sheriff  Anesthesia: Gen. endotracheal  Estimated blood loss: 250 mL  Drains: None  Complications: None  Description of procedure: The patient was brought to the operating room by the anesthesia team. General endotracheal anesthesia was induced. The patient was turned to the prone position on the Wilson frame. The patient's  lumbosacral region was then prepared with Betadine scrub and Betadine solution. Sterile drapes were applied.  I then injected the area to be incised with Marcaine with epinephrine solution. I then used the scalpel to make a linear midline incision over the L4-5 and L5-S1 interspace. I then used electrocautery to perform a bilateral subperiosteal dissection exposing the spinous process and lamina of L3, L4, L5 and S1. We then obtained intraoperative radiograph to confirm our location. We then inserted the Verstrac retractor to provide exposure. I incised the interspinous ligament at L4-5 and L5-S1. I removed the L5 spinous process with a Leksell rongeurs.  I began the decompression by using the high speed drill to perform laminotomies at L4-5 and L5-S1 bilaterally. We then used the Kerrison punches to complete the L5 laminectomy and to widen the bilateral laminotomies/foraminotomies at L4-5 and removed the ligamentum flavum at L4-5 and L5-S1. We used the Kerrison punches to remove the medial facets at L4-5 and L5-S1. We performed wide foraminotomies about the bilateral L4, L5 and S1 nerve roots completing the decompression.  We now turned our attention to the posterior lumbar interbody fusion. I used a scalpel to incise the intervertebral disc at L4-5 and L5-S1 bilaterally. I then performed a partial intervertebral discectomy at L4-5 and L5-S1 bilaterally using the pituitary forceps. We prepared the vertebral endplates at 075-GRM and 075-GRM bilaterally for the fusion by removing the soft tissues with the curettes. We then used the trial spacers to pick the appropriate sized interbody prosthesis. We prefilled his prosthesis with a combination of local morselized autograft bone that we obtained during the decompression as well as Kinnex bone graft extender. We inserted the prefilled prosthesis into the interspace at L4-5 and L5-S1 bilaterally, we expanded the prosthesis. There was a good snug fit of the prosthesis in  the  interspace. We then filled and the remainder of the intervertebral disc space with local morselized autograft bone and Kinnex. This completed the posterior lumbar interbody arthrodesis.  We now turned attention to the instrumentation. Under fluoroscopic guidance we cannulated the bilateral L4, L5 and S1 pedicles with the bone probe. We then removed the bone probe. We then tapped the pedicle with a 5.5 and 6.5 millimeter tap. We then removed the tap. We probed inside the tapped pedicle with a ball probe to rule out cortical breaches. We then inserted a 6.5 and 7.5 x 45 and 40 millimeter pedicle screw into the L4, L5 and S1 pedicles bilaterally under fluoroscopic guidance. We then palpated along the medial aspect of the pedicles to rule out cortical breaches. There were none. The nerve roots were not injured. We then connected the unilateral pedicle screws with a lordotic rod. We compressed the construct and secured the rod in place with the caps. We then tightened the caps appropriately. There was not room to place a cross connector. This completed the instrumentation from L4 to S1.  We now turned our attention to the posterior lateral arthrodesis at L4-5 and L5-S1 bilaterally. We used the high-speed drill to decorticate the remainder of the facets, pars, transverse process at L4-5 and L5-S1 bilaterally. We then applied a combination of local morselized autograft bone and Kinnex bone graft extender over these decorticated posterior lateral structures. This completed the posterior lateral arthrodesis.  We then obtained hemostasis using bipolar electrocautery. We irrigated the wound out with bacitracin solution. We inspected the thecal sac and nerve roots and noted they were well decompressed. We then removed the retractor. We placed vancomycin powder in the wound. We reapproximated patient's thoracolumbar fascia with interrupted #1 Vicryl suture. We reapproximated patient's subcutaneous tissue with  interrupted 2-0 Vicryl suture. The reapproximated patient's skin with Steri-Strips and benzoin. The wound was then coated with bacitracin ointment. A sterile dressing was applied. The drapes were removed. The patient was subsequently returned to the supine position where they were extubated by the anesthesia team. He was then transported to the post anesthesia care unit in stable condition. All sponge instrument and needle counts were reportedly correct at the end of this case.

## 2016-02-17 NOTE — Evaluation (Signed)
Physical Therapy Evaluation Patient Details Name: Madeline Young MRN: [id number] DOB: 05/08/1956 Today's Date: 02/17/2016   History of Present Illness  patient is a 60 yo female s/p L4-5 and L5-S1 decompression, instrumentation, and fusion  Clinical Impression  Patient demonstrates deficits in functional mobility as indicated below. Will need continued skilled PT to address deficits and maximize function. Will see as indicated and progress as tolerated.    Follow Up Recommendations Supervision/Assistance - 24 hour;Supervision for mobility/OOB    Equipment Recommendations  Rolling walker with 5" wheels;3in1 (PT) (pending progress (may not need))    Recommendations for Other Services       Precautions / Restrictions Precautions Precautions: Back Precaution Booklet Issued: Yes (comment) Precaution Comments: verbally reviewed Required Braces or Orthoses: Spinal Brace Spinal Brace: Lumbar corset Restrictions Weight Bearing Restrictions: No      Mobility  Bed Mobility Overal bed mobility: Needs Assistance Bed Mobility: Rolling;Sidelying to Sit;Sit to Sidelying Rolling: Min assist Sidelying to sit: Min assist     Sit to sidelying: Min assist General bed mobility comments: VCs for positioning and technique, assist to elevate trunk to come to upright and assist to elevate legs to return to supine  Transfers Overall transfer level: Needs assistance Equipment used: 1 person hand held assist Transfers: Sit to/from Stand Sit to Stand: Mod assist         General transfer comment: Mod assist to power up to standing, VCs for hand placement and body posture.  Ambulation/Gait Ambulation/Gait assistance: Mod assist Ambulation Distance (Feet): 4 Feet Assistive device: 1 person hand held assist       General Gait Details: tolerated 4 steps from bed and back before getting significantly dizzy and nauseated   Stairs            Wheelchair Mobility    Modified Rankin  (Stroke Patients Only)       Balance Overall balance assessment: No apparent balance deficits (not formally assessed)                                           Pertinent Vitals/Pain Pain Assessment: 0-10 Pain Score: 4  Pain Location: back Pain Descriptors / Indicators: Pressure Pain Intervention(s): Monitored during session    Home Living Family/patient expects to be discharged to:: Private residence Living Arrangements: Spouse/significant other Available Help at Discharge: Family Type of Home: House Home Access: Stairs to enter Entrance Stairs-Rails: Can reach both Entrance Stairs-Number of Steps: 2 Home Layout: One level Home Equipment: None      Prior Function Level of Independence: Independent               Hand Dominance   Dominant Hand: Right    Extremity/Trunk Assessment   Upper Extremity Assessment: Overall WFL for tasks assessed           Lower Extremity Assessment: Overall WFL for tasks assessed (reports history of decreased sensation at baseline)         Communication   Communication: No difficulties  Cognition Arousal/Alertness: Awake/alert Behavior During Therapy: Flat affect Overall Cognitive Status: Within Functional Limits for tasks assessed                      General Comments      Exercises        Assessment/Plan    PT Assessment Patient needs continued PT services  PT  Diagnosis Difficulty walking;Abnormality of gait;Acute pain   PT Problem List Decreased strength;Decreased activity tolerance;Decreased balance;Decreased mobility;Impaired sensation;Pain  PT Treatment Interventions DME instruction;Gait training;Stair training;Functional mobility training;Therapeutic activities;Therapeutic exercise;Balance training;Patient/family education   PT Goals (Current goals can be found in the Care Plan section) Acute Rehab PT Goals Patient Stated Goal: to go home PT Goal Formulation: With  patient/family Time For Goal Achievement: 03/02/16 Potential to Achieve Goals: Good    Frequency Min 5X/week   Barriers to discharge        Co-evaluation               End of Session Equipment Utilized During Treatment: Gait belt;Back brace Activity Tolerance: Treatment limited secondary to medical complications (Comment) (dizziness and nausea) Patient left: in bed;with call bell/phone within reach;with family/visitor present Nurse Communication: Mobility status;Other (comment) (nauseated and dizzy)         Time: [id number] PT Time Calculation (min) (ACUTE ONLY): 16 min   Charges:   PT Evaluation $PT Eval Moderate Complexity: 1 Procedure     PT G CodesDuncan Dull 03-05-2016, 5:04 PM Alben Deeds, [place] DPT  [phone]

## 2016-02-17 NOTE — H&P (Signed)
Subjective: The patient is a 60 year old white female who has complained of back, buttock, and leg pain consistent with neurogenic claudication. She has failed medical management and was worked up with lumbar x-rays lumbar MRI. This demonstrated spinal listhesis and spinal stenosis at L4-5 and L5-S1. I discussed the situation with the patient and reviewed her imaging studies with her. We have discussed the various treatment options. She has decided to proceed with surgery.   Past Medical History  Diagnosis Date  . GERD (gastroesophageal reflux disease)   . Insomnia   . Depression   . Hyperlipidemia   . ADHD (attention deficit hyperactivity disorder)   . Arthritis   . 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 Globus sensation   . Psoriatic arthritis ([place])   . Spinal stenosis of lumbar region June 2016    MRI   . Multinodular goiter (nontoxic)   . Statin intolerance 01/27/2013  . Allergy   . Anxiety   . Cataract     bil cateracts removed  . Complication of anesthesia     first colonoscopy pt states she woke up    Past Surgical History  Procedure Laterality Date  . Foot surgery  2006    Right foot , secondary to severe loss of joint [facility])  . Colonoscopy w/ polypectomy    . Upper gastrointestinal endoscopy  2005    With empiric esophageal dilation  . Tonsillectomy    . Biopsy thyroid  2014  . Eye surgery      bilateral cataract surgery w/ lens implant    Allergies  Allergen Reactions  . Avelox [Moxifloxacin Hcl In Nacl]     Pt cant remember reaction  . Nitrofurantoin     Other reaction(s): Other (See Comments) tired Pt cant remember reaction  . Nitrofurantoin Monohyd Macro   . Sertraline Hcl     Made me crazy   . Statins     Other reaction(s): Other (See Comments) arthralia Muscle aches  . Latex Itching and Rash    Redness    Social History  Substance Use Topics  . Smoking status: Former Smoker    Types: Cigarettes    Quit date: 08/21/1971  . Smokeless tobacco:  Never Used  . Alcohol Use: 0.6 oz/week    1 Glasses of wine per week     Comment: daily    Family History  Problem Relation Age of Onset  . Heart disease Mother   . Coronary artery disease Father   . Colon cancer Neg Hx   . Esophageal cancer Neg Hx   . Rectal cancer Neg Hx   . Stomach cancer Neg Hx    Prior to Admission medications   Medication Sig Start Date End Date Taking? Authorizing Provider  aspirin 81 MG tablet Take 81 mg by mouth daily.     Yes Historical Provider, MD  celecoxib (CELEBREX) 200 MG capsule TAKE ONE CAPSULE BY MOUTH TWICE DAILY Patient taking differently: TAKE ONE CAPSULE BY MOUTH once daily 11/24/15  Yes Crecencio Mc, MD  cholecalciferol (VITAMIN D) 1000 UNITS tablet Take 1,000 Units by mouth daily.     Yes Historical Provider, MD  diclofenac sodium (VOLTAREN) 1 % GEL Apply 1 application topically as needed (for pain).    Yes Historical Provider, MD  FLUoxetine (PROZAC) 40 MG capsule Take 40 mg by mouth daily.    Yes Historical Provider, MD  HYDROcodone-acetaminophen (NORCO/VICODIN) 5-325 MG per tablet Take 1 tablet by mouth daily as needed for  moderate pain.    Yes Historical Provider, MD  ibuprofen (ADVIL,MOTRIN) 200 MG tablet Take 400 mg by mouth daily as needed for moderate pain.   Yes Historical Provider, MD  Lactobacillus-Inulin ([place] PO) Take 1 tablet by mouth daily.    Yes Historical Provider, MD  Omega-3 Fatty Acids (FISH OIL) 1200 MG CAPS Take 1,200 mg by mouth daily.    Yes Historical Provider, MD  Polyethyl Glycol-Propyl Glycol (SYSTANE ULTRA) 0.4-0.3 % SOLN Place 1 drop into both eyes daily as needed (for dry eyes).   Yes Historical Provider, MD  VYVANSE 30 MG capsule Take 30 mg by mouth every morning. 12/19/15  Yes Historical Provider, MD  zolpidem (AMBIEN) 10 MG tablet Take 5 mg by mouth at bedtime.    Yes Historical Provider, MD     Review of Systems  Positive ROS: As above  All other systems have been reviewed and were  otherwise negative with the exception of those mentioned in the HPI and as above.  Objective: Vital signs in last 24 hours: Temp:  [98.9 F (37.2 C)] 98.9 F (37.2 C) (05/11 0600) Pulse Rate:  [62] 62 (05/11 0600) Resp:  [18] 18 (05/11 0600) BP: (129)/(63) 129/63 mmHg (05/11 0600) SpO2:  [99 %] 99 % (05/11 0600)  General Appearance: Alert, cooperative, no distress, Head: Normocephalic, without obvious abnormality, atraumatic Eyes: PERRL, conjunctiva/corneas clear, EOM's intact,    Ears: Normal  Throat: Normal  Neck: Supple, symmetrical, trachea midline, no adenopathy; thyroid: No enlargement/tenderness/nodules; no carotid bruit or JVD Back: Symmetric, no curvature, ROM normal, no CVA tenderness Lungs: Clear to auscultation bilaterally, respirations unlabored Heart: Regular rate and rhythm, no murmur, rub or gallop Abdomen: Soft, non-tender,, no masses, no organomegaly Extremities: Extremities normal, atraumatic, no cyanosis or edema Pulses: 2+ and symmetric all extremities Skin: Skin color, texture, turgor normal, no rashes or lesions  NEUROLOGIC:   Mental status: alert and oriented, no aphasia, good attention span, Fund of knowledge/ memory ok Motor Exam - grossly normal Sensory Exam - grossly normal Reflexes:  Coordination - grossly normal Gait - grossly normal Balance - grossly normal Cranial Nerves: I: smell Not tested  II: visual acuity  OS: Normal  OD: Normal   II: visual fields Full to confrontation  II: pupils Equal, round, reactive to light  III,VII: ptosis None  III,IV,VI: extraocular muscles  Full ROM  V: mastication Normal  V: facial light touch sensation  Normal  V,VII: corneal reflex  Present  VII: facial muscle function - upper  Normal  VII: facial muscle function - lower Normal  VIII: hearing Not tested  IX: soft palate elevation  Normal  IX,X: gag reflex Present  XI: trapezius strength  5/5  XI: sternocleidomastoid strength 5/5  XI: neck flexion  strength  5/5  XII: tongue strength  Normal    Data Review Lab Results  Component Value Date   WBC 7.6 02/09/2016   HGB 12.4 02/09/2016   HCT 37.9 02/09/2016   MCV 97.7 02/09/2016   PLT 248 02/09/2016   Lab Results  Component Value Date   NA 135 01/27/2013   K 4.5 01/27/2013   CL 99 01/27/2013   CO2 31 01/27/2013   BUN 23 01/27/2013   CREATININE 0.7 01/27/2013   GLUCOSE 92 01/27/2013   No results found for: INR, PROTIME  Assessment/Plan: L4-5 and L5-S1 spinal stenosis, spondylosis stasis, lumbago, lumbar radiculopathy, neurogenic claudication: I have discussed the situation with the patient. I have reviewed her imaging studies with  her and pointed out the abnormalities. We have discussed the various treatment options including surgery. I have described the surgical treatment option of an L4-5 and L5-S1 decompression, instrumentation, and fusion. I have shown her surgical models. We have discussed the risks, benefits, alternatives, and likelihood of achieving goals with surgery. I have answered all the patient's questions. She has decided to proceed with surgery.   Yariana Hoaglund D 02/17/2016 7:13 AM

## 2016-02-17 NOTE — Progress Notes (Signed)
Subjective:  The patient is alert and pleasant. She is in no apparent distress.  Objective: Vital signs in last 24 hours: Temp:  [98.1 F (36.7 C)-98.9 F (37.2 C)] 98.1 F (36.7 C) (05/11 1300) Pulse Rate:  [62-90] 90 (05/11 1300) Resp:  [16-18] 16 (05/11 1300) BP: (129-132)/(63-65) 132/65 mmHg (05/11 1300) SpO2:  [99 %-100 %] 100 % (05/11 1300)  Intake/Output from previous day:   Intake/Output this shift: Total I/O In: 2800 [I.V.:2800] Out: 750 [Urine:550; Blood:200]  Physical exam the patient is alert and pleasant. She is moving her lower extremities well.  Lab Results: No results for input(s): WBC, HGB, HCT, PLT in the last 72 hours. BMET No results for input(s): NA, K, CL, CO2, GLUCOSE, BUN, CREATININE, CALCIUM in the last 72 hours.  Studies/Results: Dg Lumbar Spine 2-3 Views  02/17/2016  CLINICAL DATA:  Status post lower lumbar posterior fusion EXAM: DG C-ARM 61-120 MIN; LUMBAR SPINE - 2-3 VIEW COMPARISON:  Intraoperative images obtained earlier in the day and July 16, 2015 FLUOROSCOPY TIME:  0 minutes 56 seconds; 2 submitted images FINDINGS: Frontal and lateral view show pedicle screws bilaterally at L4, L5, and S1 as well as disc spacers at L4-5 and L5-S1. The visualized support hardware appears intact on submitted images. No fracture. Stable slight anterolisthesis of L5 on S1. IMPRESSION: Support hardware intact as described. No fracture evident. There is stable slight anterolisthesis of L5 on S1. Electronically Signed   By: Lowella Grip III M.D.   On: 02/17/2016 12:30   Dg Lumbar Spine 2-3 Views  02/17/2016  CLINICAL DATA:  60 year old female undergoing lumbar surgery. Initial encounter. EXAM: LUMBAR SPINE - 2-3 VIEW COMPARISON:  [place] [facility] Lumbar radiographs 07/16/2015. [facility] lumbar MRI 04/07/2015 FINDINGS: Normal lumbar segmentation demonstrated on the 2016 radiographs, and this is the same numbering system as on the 2016 MRI.  Chronic anterolisthesis at L4-L5 and to a lesser extent L5-S1. Intraoperative portable cross-table lateral view of the lumbar spine labeled #1 at 0814 hours. Solitary Surgical probe directed at the L4 vertebral body, at the level of the lower margin of the pedicle. Film labeled #2 at 0823 hours. Solitary surgical probe now directed at the inferior L5 or L5-S1 spinal level. IMPRESSION: Intraoperative localization initially at L4, and ultimately at L5-S1 Electronically Signed   By: Genevie Ann M.D.   On: 02/17/2016 09:30   Dg C-arm 61-120 Min  02/17/2016  CLINICAL DATA:  Status post lower lumbar posterior fusion EXAM: DG C-ARM 61-120 MIN; LUMBAR SPINE - 2-3 VIEW COMPARISON:  Intraoperative images obtained earlier in the day and July 16, 2015 FLUOROSCOPY TIME:  0 minutes 56 seconds; 2 submitted images FINDINGS: Frontal and lateral view show pedicle screws bilaterally at L4, L5, and S1 as well as disc spacers at L4-5 and L5-S1. The visualized support hardware appears intact on submitted images. No fracture. Stable slight anterolisthesis of L5 on S1. IMPRESSION: Support hardware intact as described. No fracture evident. There is stable slight anterolisthesis of L5 on S1. Electronically Signed   By: Lowella Grip III M.D.   On: 02/17/2016 12:30    Assessment/Plan: The patient is doing well.  LOS: 0 days     Guerline Happ D 02/17/2016, 1:13 PM

## 2016-02-17 NOTE — Progress Notes (Signed)
Patient is complaining of eyes being dry and burning. Wiped with a wet wash cloth, patient states feels better. She wants the wash cloth kept over her eyes. Now is resting

## 2016-02-17 NOTE — Progress Notes (Signed)
Orthopedic Tech Progress Note Patient Details:  Madeline Young [id number] Patient already has brace. Patient ID: OMAR GRANDI, female   DOB: 1955-10-26, 59 y.o.   MRN: [id number]   Braulio Bosch 02/17/2016, 5:25 PM

## 2016-02-17 NOTE — Progress Notes (Signed)
Utilization review completed.  

## 2016-02-17 NOTE — Anesthesia Procedure Notes (Signed)
Procedure Name: Intubation Date/Time: 02/17/2016 7:38 AM Performed by: Carney Living Pre-anesthesia Checklist: Patient identified, Emergency Drugs available, Suction available, Patient being monitored and Timeout performed Patient Re-evaluated:Patient Re-evaluated prior to inductionOxygen Delivery Method: Circle system utilized Preoxygenation: Pre-oxygenation with 100% oxygen Intubation Type: IV induction Ventilation: Mask ventilation without difficulty Laryngoscope Size: Mac and 4 Grade View: Grade II Tube type: Oral Tube size: 7.0 mm Number of attempts: 1 Airway Equipment and Method: Stylet Placement Confirmation: ETT inserted through vocal cords under direct vision,  positive ETCO2 and breath sounds checked- equal and bilateral Secured at: 21 cm Tube secured with: Tape Dental Injury: Teeth and Oropharynx as per pre-operative assessment

## 2016-02-17 NOTE — Anesthesia Postprocedure Evaluation (Signed)
Anesthesia Post Note  Patient: Jarvis D Shellenbarger  Procedure(s) Performed: Procedure(s) (LRB): POSTERIOR LUMBAR FUSION 2 LEVEL (N/A)  Patient location during evaluation: PACU Anesthesia Type: General Level of consciousness: awake, awake and alert and oriented Pain management: pain level controlled Vital Signs Assessment: post-procedure vital signs reviewed and stable Respiratory status: spontaneous breathing, nonlabored ventilation and respiratory function stable Cardiovascular status: blood pressure returned to baseline Anesthetic complications: no    Last Vitals:  Filed Vitals:   02/17/16 1441 02/17/16 1512  BP:  115/46  Pulse: 80 77  Temp:  36.5 C  Resp: 17 18    Last Pain:  Filed Vitals:   02/17/16 1514  PainSc: 3                  Landy Dunnavant COKER

## 2016-02-17 NOTE — Anesthesia Preprocedure Evaluation (Addendum)
Anesthesia Evaluation  Patient identified by MRN, date of birth, ID band Patient awake    Reviewed: Allergy & Precautions, NPO status , Patient's Chart, lab work & pertinent test results  Airway Mallampati: II  TM Distance: >3 FB Neck ROM: Full    Dental  (+) Teeth Intact, Dental Advisory Given   Pulmonary former smoker,    breath sounds clear to auscultation       Cardiovascular  Rhythm:Regular Rate:Normal     Neuro/Psych Anxiety Depression    GI/Hepatic GERD  Controlled,  Endo/Other    Renal/GU      Musculoskeletal  (+) Arthritis , Osteoarthritis,    Abdominal   Peds  Hematology   Anesthesia Other Findings Psoriatic arthritis   Reproductive/Obstetrics                           Anesthesia Physical Anesthesia Plan  ASA: II  Anesthesia Plan: General   Post-op Pain Management:    Induction: Intravenous  Airway Management Planned: Oral ETT  Additional Equipment:   Intra-op Plan:   Post-operative Plan: Extubation in OR  Informed Consent: I have reviewed the patients History and Physical, chart, labs and discussed the procedure including the risks, benefits and alternatives for the proposed anesthesia with the patient or authorized representative who has indicated his/her understanding and acceptance.   Dental advisory given  Plan Discussed with: CRNA, Anesthesiologist and Surgeon  Anesthesia Plan Comments:         Anesthesia Quick Evaluation

## 2016-02-18 LAB — BASIC METABOLIC PANEL
ANION GAP: 9 (ref 5–15)
BUN: 9 mg/dL (ref 6–20)
CALCIUM: 8.1 mg/dL — AB (ref 8.9–10.3)
CO2: 28 mmol/L (ref 22–32)
CREATININE: 0.58 mg/dL (ref 0.44–1.00)
Chloride: 98 mmol/L — ABNORMAL LOW (ref 101–111)
Glucose, Bld: 139 mg/dL — ABNORMAL HIGH (ref 65–99)
Potassium: 3.8 mmol/L (ref 3.5–5.1)
SODIUM: 135 mmol/L (ref 135–145)

## 2016-02-18 LAB — CBC
HCT: 29.6 % — ABNORMAL LOW (ref 36.0–46.0)
HEMOGLOBIN: 9.4 g/dL — AB (ref 12.0–15.0)
MCH: 31 pg (ref 26.0–34.0)
MCHC: 31.8 g/dL (ref 30.0–36.0)
MCV: 97.7 fL (ref 78.0–100.0)
Platelets: 170 10*3/uL (ref 150–400)
RBC: 3.03 MIL/uL — ABNORMAL LOW (ref 3.87–5.11)
RDW: 13.7 % (ref 11.5–15.5)
WBC: 5.7 10*3/uL (ref 4.0–10.5)

## 2016-02-18 MED ORDER — OXYCODONE-ACETAMINOPHEN 5-325 MG PO TABS: 1.0000 | ORAL_TABLET | ORAL | Status: DC | PRN

## 2016-02-18 MED ORDER — POLYETHYLENE GLYCOL 3350 17 G PO PACK
17.0000 g | PACK | Freq: Every day | ORAL | Status: DC
  Administered 2016-02-18: 17 g via ORAL

## 2016-02-18 MED ORDER — DOCUSATE SODIUM 100 MG PO CAPS: 100.0000 mg | ORAL_CAPSULE | Freq: Two times a day (BID) | ORAL | Status: DC

## 2016-02-18 MED ORDER — CYCLOBENZAPRINE HCL 10 MG PO TABS: 10.0000 mg | ORAL_TABLET | Freq: Three times a day (TID) | ORAL | Status: DC | PRN

## 2016-02-18 NOTE — Progress Notes (Signed)
Physical Therapy Treatment Patient Details Name: Madeline Young MRN: [id number] DOB: September 17, 1956 Today's Date: 02/18/2016    History of Present Illness patient is a 60 yo female s/p L4-5 and L5-S1 decompression, instrumentation, and fusion    PT Comments    Pt with excellent improvement from last session. Pt unable to recall precautions and educated for all with handout provided. Pt with tendency for twisting with mobility and educated for functional mobility and safety throughout. Pt able to perform hall ambulation and stairs today. Will continue to follow acutely.    Follow Up Recommendations  Supervision for mobility/OOB     Equipment Recommendations  3in1 (PT)    Recommendations for Other Services       Precautions / Restrictions Precautions Precautions: Back Precaution Booklet Issued: Yes (comment) Precaution Comments: pt unable to recall precautions and educated for all with handout Required Braces or Orthoses: Spinal Brace Spinal Brace: Lumbar corset;Applied in sitting position    Mobility  Bed Mobility Overal bed mobility: Needs Assistance Bed Mobility: Rolling;Sidelying to Sit Rolling: Min guard Sidelying to sit: Min guard       General bed mobility comments: cues for sequence, technique and particularly not to twist with mobility  Transfers Overall transfer level: Needs assistance   Transfers: Sit to/from Stand Sit to Stand: Min guard         General transfer comment: cues for posture and position from bed, supervision from [facility]  Ambulation/Gait Ambulation/Gait assistance: Supervision Ambulation Distance (Feet): 350 Feet Assistive device: None Gait Pattern/deviations: Step-through pattern;Decreased stride length   Gait velocity interpretation: Below normal speed for age/gender General Gait Details: pt with tendency to reach for handrail throughout but not reliant on, short strides and cautious   Stairs Stairs: Yes Stairs assistance:  Supervision Stair Management: Step to pattern;Sideways;One rail Right Number of Stairs: 2 General stair comments: cues for sequence as pt unable to perform with forward ascent  Wheelchair Mobility    Modified Rankin (Stroke Patients Only)       Balance                                    Cognition Arousal/Alertness: Awake/alert Behavior During Therapy: WFL for tasks assessed/performed Overall Cognitive Status: Within Functional Limits for tasks assessed                      Exercises      General Comments        Pertinent Vitals/Pain Pain Assessment: 0-10 Pain Score: 8  Pain Location: incision Pain Descriptors / Indicators: Aching Pain Intervention(s): Limited activity within patient's tolerance;Repositioned;Premedicated before session;Patient requesting pain meds-RN notified    Home Living                      Prior Function            PT Goals (current goals can now be found in the care plan section) Progress towards PT goals: Progressing toward goals    Frequency       PT Plan Current plan remains appropriate    Co-evaluation             End of Session Equipment Utilized During Treatment: Back brace Activity Tolerance: Patient tolerated treatment well Patient left: in chair;with call bell/phone within reach;with family/visitor present     Time: [id number] PT Time Calculation (min) (ACUTE ONLY): 29 min  Charges:  $  Gait Training: 8-22 mins $Therapeutic Activity: 8-22 mins                    G Codes:      Melford Aase Feb 29, 2016, 11:07 AM Elwyn Reach, [place]

## 2016-02-18 NOTE — Discharge Summary (Signed)
  Physician Discharge Summary  Patient ID: Madeline Young MRN: [id number] DOB/AGE: 1956/09/20 60 y.o.  Admit date: 02/17/2016 Discharge date: 02/18/2016  Admission Diagnoses:L4-5 and L5-S1 spondylolisthesis, spinal stenosis, lumbago, lumbar radiculopathy, facet arthropathy  Discharge Diagnoses: The same Active Problems:   Spondylolisthesis of lumbar region   Discharged Condition: good  Hospital Course: I performed an L4-5 and L5-S1 decompression, instrumentation, and fusion on the patient on 02/17/2016. The surgery went well.  The patient's postoperative course was unremarkable. The patient may want to go home later today. She was given written and oral discharge instructions. All her questions were answered.  Consults: Physical therapy Significant Diagnostic Studies: None Treatments: L4-5 and L5-S1 decompression, instrumentation, and fusion. Discharge Exam: Blood pressure 104/47, pulse 72, temperature 98 F (36.7 C), temperature source Oral, resp. rate 18, SpO2 99 %. The patient is alert and pleasant. She is moving her lower extremities well. She looks well.  Disposition: Home     Medication List    STOP taking these medications        celecoxib 200 MG capsule  Commonly known as:  CELEBREX     diclofenac sodium 1 % Gel  Commonly known as:  VOLTAREN     HYDROcodone-acetaminophen 5-325 MG tablet  Commonly known as:  NORCO/VICODIN     ibuprofen 200 MG tablet  Commonly known as:  ADVIL,MOTRIN      TAKE these medications        aspirin 81 MG tablet  Take 81 mg by mouth daily.     cholecalciferol 1000 units tablet  Commonly known as:  VITAMIN D  Take 1,000 Units by mouth daily.     CULTURELLE DIGESTIVE HEALTH PO  Take 1 tablet by mouth daily.     cyclobenzaprine 10 MG tablet  Commonly known as:  FLEXERIL  Take 1 tablet (10 mg total) by mouth 3 (three) times daily as needed.     docusate sodium 100 MG capsule  Commonly known as:  COLACE  Take 1 capsule  (100 mg total) by mouth 2 (two) times daily.     Fish Oil 1200 MG Caps  Take 1,200 mg by mouth daily.     FLUoxetine 40 MG capsule  Commonly known as:  PROZAC  Take 40 mg by mouth daily.     oxyCODONE-acetaminophen 5-325 MG tablet  Commonly known as:  PERCOCET/ROXICET  Take 1-2 tablets by mouth every 4 (four) hours as needed for moderate pain.     SYSTANE ULTRA 0.4-0.3 % Soln  Generic drug:  Polyethyl Glycol-Propyl Glycol  Place 1 drop into both eyes daily as needed (for dry eyes).     VYVANSE 30 MG capsule  Generic drug:  lisdexamfetamine  Take 30 mg by mouth every morning.     zolpidem 10 MG tablet  Commonly known as:  AMBIEN  Take 5 mg by mouth at bedtime.         SignedOphelia Charter 02/18/2016, 7:39 AM

## 2016-02-18 NOTE — Discharge Instructions (Signed)

## 2016-02-19 NOTE — Progress Notes (Signed)
Physical Therapy Treatment Patient Details Name: Madeline Young MRN: [id number] DOB: 04-16-1956 Today's Date: 06-Mar-2016    History of Present Illness patient is a 60 yo female s/p L4-5 and L5-S1 decompression, instrumentation, and fusion    PT Comments    I have encouraged the patient to gradually increase activity daily to tolerance.  Answered all questions regarding mobility and precautions. Pt and husband feel ready and safe for DC home today.   Follow Up Recommendations        Equipment Recommendations       Recommendations for Other Services       Precautions / Restrictions Precautions Precautions: Back Precaution Booklet Issued: Yes (comment) Precaution Comments: Able to recall precautions today Required Braces or Orthoses: Spinal Brace Spinal Brace: Lumbar corset;Applied in sitting position Restrictions Weight Bearing Restrictions: No    Mobility  Bed Mobility Overal bed mobility:  (Verbally discussed log rolling with pt/husband)                Transfers Overall transfer level: Needs assistance   Transfers: Sit to/from Stand Sit to Stand: Min assist         General transfer comment: increased time to stand from bed. Discussed options for transfers at home with husband to assist  Ambulation/Gait                 Stairs            Wheelchair Mobility    Modified Rankin (Stroke Patients Only)       Balance                                    Cognition Arousal/Alertness: Awake/alert Behavior During Therapy: WFL for tasks assessed/performed Overall Cognitive Status: Within Functional Limits for tasks assessed                      Exercises      General Comments General comments (skin integrity, edema, etc.): Pt sitting on EOB, dressed and preparing for DC.  Pt and husband had questions regarding mobility at [facility] and safest techniques for mobility.        Pertinent Vitals/Pain Pain Assessment:  0-10 Pain Score:  (pt did not rate in today's session)    Home Living                      Prior Function            PT Goals (current goals can now be found in the care plan section) Acute Rehab PT Goals Patient Stated Goal: to go home Progress towards PT goals: Progressing toward goals    Frequency       PT Plan Current plan remains appropriate    Co-evaluation             End of Session Equipment Utilized During Treatment: Back brace Activity Tolerance: Patient tolerated treatment well Patient left: Other (comment) (in room with husband prepping for DC)     Time: [id number] PT Time Calculation (min) (ACUTE ONLY): 13 min  Charges:  $Therapeutic Activity: 8-22 mins                    G Codes:      Madeline Young Mar 06, 2016, 7:59 AM Lavonia Dana, PT  [phone] 03-06-2016

## 2016-02-19 NOTE — Progress Notes (Signed)
Discharged instructions/education/Rx given to patient with husband at bedside and they both verbalized understanding, pain is low to moderate and controlled by PRN meds. No swelling, no drainage, no redness noted on incision site. Patient d/c via wheelcchair.

## 2016-03-16 ENCOUNTER — Ambulatory Visit: Payer: Medicare Other | Admitting: Physical Therapy

## 2016-03-17 ENCOUNTER — Ambulatory Visit: Payer: 59 | Attending: Neurosurgery | Admitting: Physical Therapy

## 2016-03-17 ENCOUNTER — Encounter: Payer: Self-pay | Admitting: Physical Therapy

## 2016-03-17 DIAGNOSIS — M545 Low back pain, unspecified: Secondary | ICD-10-CM

## 2016-03-17 DIAGNOSIS — M6281 Muscle weakness (generalized): Secondary | ICD-10-CM | POA: Diagnosis not present

## 2016-03-17 DIAGNOSIS — M62830 Muscle spasm of back: Secondary | ICD-10-CM | POA: Diagnosis not present

## 2016-03-17 NOTE — Therapy (Signed)
[place] [facility] [address], [place], [address] Phone: [phone]   Fax:  [phone]  Physical Therapy Evaluation  Patient Details  Name: Madeline Young MRN: [id number] Date of Birth: 1956-07-08 Referring Provider: Ophelia Charter MD  Encounter Date: 03/17/2016      PT End of Session - 03/17/16 1215    Visit Number 1   Number of Visits 12   Date for PT Re-Evaluation 04/28/16   Authorization Type 1   Authorization Time Period 10(G code)   PT Start Time 1055   PT Stop Time 1155   PT Time Calculation (min) 60 min   Activity Tolerance Patient tolerated treatment well   Behavior During Therapy [facility] for tasks assessed/performed      Past Medical History  Diagnosis Date  . GERD (gastroesophageal reflux disease)   . Insomnia   . Depression   . Hyperlipidemia   . ADHD (attention deficit hyperactivity disorder)   . Arthritis   . Adenoma 10/06/2008    sigmoid 34mm  . Globus sensation   . Psoriatic arthritis ([place])   . Spinal stenosis of lumbar region June 2016    MRI   . Multinodular goiter (nontoxic)   . Statin intolerance 01/27/2013  . Allergy   . Anxiety   . Cataract     bil cateracts removed  . Complication of anesthesia     first colonoscopy pt states she woke up    Past Surgical History  Procedure Laterality Date  . Foot surgery  2006    Right foot , secondary to severe loss of joint [facility])  . Colonoscopy w/ polypectomy    . Upper gastrointestinal endoscopy  2005    With empiric esophageal dilation  . Tonsillectomy    . Biopsy thyroid  2014  . Eye surgery      bilateral cataract surgery w/ lens implant    There were no vitals filed for this visit.       Subjective Assessment - 03/17/16 1124    Subjective Patient reports she is having a weird pain in right lower back upper buttock; feels like something is in there.    Limitations Sitting;Standing;Walking;House hold  activities;Lifting   Patient Stated Goals be able to perform all tasks without difficulty or pain and improve srtrength   Currently in Pain? Yes   Pain Score 7    Pain Location Back   Pain Orientation Right;Lower  buttock area   Pain Descriptors / Indicators Aching;Discomfort;Spasm   Pain Type Acute pain   Pain Radiating Towards --  s/p surgery 02/17/16   Pain Onset More than a month ago   Pain Frequency Intermittent   Aggravating Factors  increased activity/bending    Pain Relieving Factors ice, rest   Effect of Pain on Daily Activities limited activity due to surgical precautions            [facility] PT Assessment - 03/17/16 1111    Assessment   Medical Diagnosis M43.16 Spondylolisthesis, lumbar region   Referring Provider Ophelia Charter MD   Onset Date/Surgical Date 02/17/16   Hand Dominance Right   Next MD Visit unknown   Prior Therapy none post op. surgery   Precautions   Precautions Other (comment)  per lumbar fusion protocol   Required Braces or Orthoses Spinal Brace   Spinal Brace Lumbar corset   Restrictions   Weight Bearing Restrictions No   Other Position/Activity Restrictions bending, sitting for prolonged periods  of time   Balance Screen   Has the patient fallen in the past 6 months No   Has the patient had a decrease in activity level because of a fear of falling?  Yes  due to spinal surgery   Is the patient reluctant to leave their home because of a fear of falling?  No   Home Environment   Living Environment Private residence   Living Arrangements Spouse/significant other   Type of [place] to enter   Entrance Stairs-Number of Steps 4   Entrance Stairs-Rails Right  going up   [place] - single point;Toilet riser   Prior Function   Level of Independence Independent;Independent with community mobility without device   Vocation On disability   Leisure garden, reading, going out    Cognition    Overall Cognitive Status Within Functional Limits for tasks assessed   Attention Focused      Objective; AROM: lumited lumbar and LE assessment due to recent surgery and precautions, UE's, cervical spine WNL's Strength: not assessed formally, WFL's for both UE's and LE's as noted with ability to walk walk without assistive device and perform daily tasks Sensation: grossly intact to light touch throughout both UE's and LE's Observation: well healed incision along lower lumbar spine  Outcome measures: Modified Oswestry 56% (in need of intervention) Outcome measure: 10MW 9 seconds (WFL for safe household and community ambulation)  Treatment: Exercise: patient performed exercises with guidance, verbal and tactile cues and demonstration of PT: Chin tucks x 10 Scapular retraction x 10 Hip adduction with ball and glute sets x 10 Quad sets in sitting glute sets in standing Hip abduction with red resistive band x 15 TrA contractions in sitting and with all exercises  Instructed in precautions for lumbar fusion s/p 4 weeks: no twisting, bending, limit sitting to 30 min., do not perform hip flexion >90, lifting limits per MD  Patient response to treatment: performed exercises with good technique following instruction, demonstration and with verbal cuing, verbalized good understanding of precautions and home program          PT Education - 03/17/16 1205    Education provided Yes   Education Details HEP: scapular retraction, chin tucks, TrA bracing, quad and glute sets, hip adduction and abduction in sitting; instructed in precautions for sitting, walking, lifting s/p fusion    Person(s) Educated Patient   Methods Explanation;Demonstration;Verbal cues;Handout   Comprehension Verbalized understanding;Returned demonstration;Verbal cues required             PT Long Term Goals - 03/17/16 1215    PT LONG TERM GOAL #1   Title Patient will report pain level 3/10 max. on NRPS for lower  back with walking, household chores  by 04/28/2016   Baseline Pain level 7/10 in lower back   Status New   PT LONG TERM GOAL #2   Title Patient will demonstrate improved perceived disability on Modified Oswestry to 40% or less by 04/13/16  indicating improved function with daily tasks    Status New   PT LONG TERM GOAL #3   Title Patient will demonstrate improved perceived disability on Modified Oswestry to 30% or less by 04/28/16  indicating improved function with daily tasks    Baseline MODI 56%   Status New   PT LONG TERM GOAL #4   Title Patient will be independent with home program for self management of exercises, posture awareness by 04/28/2016  Baseline requires verbal cues and guidance for appropriate exercises and progression   Status New               Plan - 03/17/16 1215    Clinical Impression Statement Patient is a 60 year old right hand dominant female who presents s/p lumbar fusion 2 levels x 4 weeks post op. She has limited knowledge of appropriate precautions, progression of exercises to improve strength, endurance and core control in oreder to return to full function with ADL's and personal care activities without difficulty or pain. She has MODI of 56% which indicates severe self perceived disability and will require physical therapy intervention.    Rehab Potential Good   Clinical Impairments Affecting Rehab Potential (+) motivated, acute condition (-) multiple comorbidities including arthritis, chronic back pain   PT Frequency 2x / week   PT Duration 6 weeks   PT Treatment/Interventions Therapeutic exercise;Electrical Stimulation;Moist Heat;Manual techniques;Patient/family education   PT Next Visit Plan progress exercises for core control, manual therapy for muscle spasms and pain control   PT Home Exercise Plan chin tucks, scapulr retraction, quad and glute sets, hip adduction and hip abduction in sitting, TrA contracitons   Consulted and Agree with Plan of Care  Patient      Patient will benefit from skilled therapeutic intervention in order to improve the following deficits and impairments:  Decreased strength, Pain, Decreased knowledge of precautions, Decreased activity tolerance, Decreased endurance, Increased muscle spasms, Decreased range of motion  Visit Diagnosis: Muscle weakness (generalized) - Plan: PT plan of care cert/re-cert  Bilateral low back pain without sciatica - Plan: PT plan of care cert/re-cert      G-Codes - [id number]    Functional Assessment Tool Used modified oswestry, pain scale, clinical judgment   Functional Limitation Mobility: Walking and moving around   Mobility: Walking and Moving Around Current Status [phone]) At least 40 percent but less than 60 percent impaired, limited or restricted   Mobility: Walking and Moving Around Goal Status [phone]) At least 20 percent but less than 40 percent impaired, limited or restricted       Problem List Patient Active Problem List   Diagnosis Date Noted  . Spondylolisthesis of lumbar region 02/17/2016  . Globus sensation since at least 2005 04/29/2015  . GERD (gastroesophageal reflux disease) 11/17/2014  . Statin intolerance 01/27/2013  . Visit for preventive health examination 01/27/2013  . Multinodular goiter (nontoxic) 08/11/2012  . Overweight (BMI 25.0-29.9) 08/11/2012  . 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 Screening for cervical cancer 08/21/2011  . Screening for breast cancer 08/21/2011  . Hx of adenomatous polyp of colon 10/06/2008  . HYPERLIPIDEMIA 09/15/2008  . DEPRESSION 09/15/2008  . ALLERGIC RHINITIS 09/15/2008  . ECZEMA 09/15/2008  . OSTEOARTHRITIS 09/15/2008    Jomarie Longs PT 03/18/2016, 9:14 AM  [place] [facility] [address], [place], [address] Phone: [phone]   Fax:  [phone]  Name: JEIDY LEVITAN MRN: [id number] Date of Birth: 1956-01-18

## 2016-03-22 ENCOUNTER — Encounter: Payer: Self-pay | Admitting: Physical Therapy

## 2016-03-22 ENCOUNTER — Ambulatory Visit: Payer: 59 | Admitting: Physical Therapy

## 2016-03-22 DIAGNOSIS — M62830 Muscle spasm of back: Secondary | ICD-10-CM

## 2016-03-22 DIAGNOSIS — M6281 Muscle weakness (generalized): Secondary | ICD-10-CM | POA: Diagnosis not present

## 2016-03-22 DIAGNOSIS — M545 Low back pain, unspecified: Secondary | ICD-10-CM

## 2016-03-22 NOTE — Therapy (Signed)
[place] [facility] [address], [place], [address] Phone: [phone]   Fax:  [phone]  Physical Therapy Treatment  Patient Details  Name: Madeline Young MRN: [id number] Date of Birth: 29-Sep-1956 Referring Provider: Ophelia Charter MD  Encounter Date: 03/22/2016      PT End of Session - 03/22/16 1005    Visit Number 2   Number of Visits 12   Date for PT Re-Evaluation 04/28/16   Authorization Type 2   Authorization Time Period 10(G code)   PT Start Time 1000   PT Stop Time 1030   PT Time Calculation (min) 30 min   Activity Tolerance Patient tolerated treatment well   Behavior During Therapy [facility] for tasks assessed/performed      Past Medical History  Diagnosis Date  . GERD (gastroesophageal reflux disease)   . Insomnia   . Depression   . Hyperlipidemia   . ADHD (attention deficit hyperactivity disorder)   . Arthritis   . Adenoma 10/06/2008    sigmoid 4mm  . Globus sensation   . Psoriatic arthritis ([place])   . Spinal stenosis of lumbar region June 2016    MRI   . Multinodular goiter (nontoxic)   . Statin intolerance 01/27/2013  . Allergy   . Anxiety   . Cataract     bil cateracts removed  . Complication of anesthesia     first colonoscopy pt states she woke up    Past Surgical History  Procedure Laterality Date  . Foot surgery  2006    Right foot , secondary to severe loss of joint [facility])  . Colonoscopy w/ polypectomy    . Upper gastrointestinal endoscopy  2005    With empiric esophageal dilation  . Tonsillectomy    . Biopsy thyroid  2014  . Eye surgery      bilateral cataract surgery w/ lens implant    There were no vitals filed for this visit.      Subjective Assessment - 03/22/16 1001    Subjective Patient reports she is hurting today in her lower back. She reports she is feeling tired today.    Limitations Sitting;Standing;Walking;House hold activities;Lifting   Patient  Stated Goals be able to perform all tasks without difficulty or pain and improve srtrength   Currently in Pain? Yes   Pain Score 7    Pain Location Back   Pain Orientation Right;Left  across lower back   Pain Descriptors / Indicators Aching;Discomfort;Spasm   Pain Type Acute pain   Pain Onset More than a month ago  surgery 02/17/16     Objective:  Gait: guarded posture on arrival Palpation: + increased tenderness and spasms in bilateral lower back/upper gluteal region left>right  Treatment: Exercise: patient performed exercises with guidance, verbal and tactile cues and demonstration of PT: Sitting: Chin tucks x 10  Sitting: Scapular retraction x 10 Sitting: Hip adduction with ball and glute sets x 10 Hip abduction with red resistive band x 15 TrA contractions in sitting and with all exercises  Re Instructed in precautions for lumbar fusion s/p 4 weeks: no twisting, bending, limit sitting to 30 min., do not perform hip flexion >90, lifting limits per MD  Manual Therapy:  STM performed to lumbar spine into gluteal region bilaterally with patient positioned in prone lying over 1 pillow with pillow under LE's for support: goals: decrease muscle spasms and pain  Patient response to treatment: performed exercises with good technique following instruction,  demonstration and with verbal cuing, verbalized good understanding of precautions and home program, patient able to walk with decreased guarded posture and decreased spasms and pain to mild (<4/10) following manual STM       PT Long Term Goals - 03/17/16 1215    PT LONG TERM GOAL #1   Title Patient will report pain level 3/10 max. on NRPS for lower back with walking, household chores  by 04/28/2016   Baseline Pain level 7/10 in lower back   Status New   PT LONG TERM GOAL #2   Title Patient will demonstrate improved perceived disability on Modified Oswestry to 40% or less by 04/13/16  indicating improved function with daily tasks     Status New   PT LONG TERM GOAL #3   Title Patient will demonstrate improved perceived disability on Modified Oswestry to 30% or less by 04/28/16  indicating improved function with daily tasks    Baseline MODI 56%   Status New   PT LONG TERM GOAL #4   Title Patient will be independent with home program for self management of exercises, posture awareness by 04/28/2016   Baseline requires verbal cues and guidance for appropriate exercises and progression   Status New               Plan - 03/22/16 1048    Clinical Impression Statement Patient demonstrated decreased spasms and pain with improved ability to walk with less stiffness and back pain at end of session. She is working on precaution and posture awareness and was able to perform exercises with improved technique following modifications and with demonstration/verbal cues.    Rehab Potential Good   PT Frequency 2x / week   PT Duration 6 weeks   PT Treatment/Interventions Therapeutic exercise;Electrical Stimulation;Moist Heat;Manual techniques;Patient/family education   PT Next Visit Plan progress exercises for core control, manual therapy for muscle spasms and pain control   PT Home Exercise Plan chin tucks, scapulr retraction, quad and glute sets, hip adduction and hip abduction in sitting, TrA contracitons; ball roll outs       Patient will benefit from skilled therapeutic intervention in order to improve the following deficits and impairments:  Decreased strength, Pain, Decreased knowledge of precautions, Decreased activity tolerance, Decreased endurance, Increased muscle spasms, Decreased range of motion  Visit Diagnosis: Muscle weakness (generalized)  Bilateral low back pain without sciatica  Muscle spasm of back     Problem List Patient Active Problem List   Diagnosis Date Noted  . Spondylolisthesis of lumbar region 02/17/2016  . Globus sensation since at least 2005 04/29/2015  . GERD (gastroesophageal reflux  disease) 11/17/2014  . Statin intolerance 01/27/2013  . Visit for preventive health examination 01/27/2013  . Multinodular goiter (nontoxic) 08/11/2012  . Overweight (BMI 25.0-29.9) 08/11/2012  . 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 Screening for cervical cancer 08/21/2011  . Screening for breast cancer 08/21/2011  . Hx of adenomatous polyp of colon 10/06/2008  . HYPERLIPIDEMIA 09/15/2008  . DEPRESSION 09/15/2008  . ALLERGIC RHINITIS 09/15/2008  . ECZEMA 09/15/2008  . OSTEOARTHRITIS 09/15/2008    Jomarie Longs PT 03/23/2016, 11:12 AM  [place] [facility] [address], [place], [address] Phone: [phone]   Fax:  [phone]  Name: Madeline Young MRN: [id number] Date of Birth: 1955-11-23

## 2016-03-24 ENCOUNTER — Ambulatory Visit: Payer: 59 | Admitting: Physical Therapy

## 2016-03-24 ENCOUNTER — Encounter: Payer: Self-pay | Admitting: Physical Therapy

## 2016-03-24 DIAGNOSIS — M62830 Muscle spasm of back: Secondary | ICD-10-CM

## 2016-03-24 DIAGNOSIS — M545 Low back pain, unspecified: Secondary | ICD-10-CM

## 2016-03-24 DIAGNOSIS — M6281 Muscle weakness (generalized): Secondary | ICD-10-CM | POA: Diagnosis not present

## 2016-03-24 NOTE — Therapy (Signed)
[place] [facility] [address] [place], [address] Phone: [phone]   Fax:  [phone]  Physical Therapy Treatment  Patient Details  Name: Madeline Young MRN: [id number] Date of Birth: Jun 01, 1956 Referring Provider: Ophelia Charter MD  Encounter Date: 03/24/2016      PT End of Session - 03/24/16 1051    Visit Number 3   Number of Visits 12   Date for PT Re-Evaluation 04/28/16   Authorization Type 3   Authorization Time Period 10(G code)   PT Start Time 0900   PT Stop Time 1000   PT Time Calculation (min) 60 min   Activity Tolerance Patient tolerated treatment well;Other (comment)  limited exercises and time due to patient having to conact MD regarding recent fall prior to beginng exercises/treatment today   Behavior During Therapy [facility] for tasks assessed/performed      Past Medical History  Diagnosis Date  . GERD (gastroesophageal reflux disease)   . Insomnia   . Depression   . Hyperlipidemia   . ADHD (attention deficit hyperactivity disorder)   . Arthritis   . Adenoma 10/06/2008    sigmoid 93mm  . Globus sensation   . Psoriatic arthritis ([place])   . Spinal stenosis of lumbar region June 2016    MRI   . Multinodular goiter (nontoxic)   . Statin intolerance 01/27/2013  . Allergy   . Anxiety   . Cataract     bil cateracts removed  . Complication of anesthesia     first colonoscopy pt states she woke up    Past Surgical History  Procedure Laterality Date  . Foot surgery  2006    Right foot , secondary to severe loss of joint [facility])  . Colonoscopy w/ polypectomy    . Upper gastrointestinal endoscopy  2005    With empiric esophageal dilation  . Tonsillectomy    . Biopsy thyroid  2014  . Eye surgery      bilateral cataract surgery w/ lens implant    There were no vitals filed for this visit.      Subjective Assessment - 03/24/16 0919    Subjective Patient reports she was  squatting down the other day and when she was getting up she fell back onto her right hip and left hand. She contacted MD prior to beginning therapy ( on arrival to clinic) and was told to go ahead and do therapy and if she had increased discomfort or pain she is to contact MD for further instructions. Currently she reports she is not having any problems and has been fine since the fall, only sore and left hand is bruised and hurting (where she caught herself during the fall).    Limitations Sitting;Standing;Walking;House hold activities;Lifting   Patient Stated Goals be able to perform all tasks without difficulty or pain and improve srtrength   Currently in Pain? Yes   Pain Score 4    Pain Location Back   Pain Orientation Right;Left   Pain Descriptors / Indicators Aching;Other (Comment)  stiffness   Pain Type Acute pain   Pain Onset More than a month ago  02/17/2016      Objective: Patient arrived to clinic with report of falling the other day as described in subjective report: Therapist instructed patient to contact MD regarding fall and to see if it was okay to have treatment today, prior to beginning session. She was instructed to have therapy and if she had any  discomfort to stop and contact MD Observation: patient arrived in clinic with back support brace in use Palpation: lumbar spine with increased warmth throughout and + swelling noted; thoracic spine paraspinals with increased spasms right>left with patient standing, decreased with patient prone lying  Treatment: Exercise: patient performed exercises with guidance, verbal and tactile cues and demonstration of PT: Standing: capular rows and lat pull downs with red resistive band x 15 each at door Side lying: clam and reverse clam with each LE with tactile and verbal cues x 15 reps  Supine: knee fall outs x 15 reps each LE with TA contraction and self monitoring for pelvis rotation/stability  Manual therapy: Patient prone in  neutral lumbar spine position with 1 pillow under abdomen, pillow under LE's: STM performed to thoracic paraspinal muscles and gluteal region superficial and deep techniques: goal: decrease pain and spasms  Modalities; ice pack applied to back following manual therapy with patient in same position: goal pain: x 15 min.; goal: decrease pain/spasms  Patient response to treatment: Patient with decreased warmth in lower lumbar spine and reported decreased pain to mild with standing and walking following treatment. Spasms decreased to mild and decreased tenderness noted following STM. Patient demonstrated good technique with all exercises following demonstration and with verbal cuing. No adverse reactions noted with ice treatment         PT Education - 03/24/16 0925    Education provided Yes   Education Details HEP: added clam and standing scapullar rows and lat pull downs, hamstring and quad, calf stretches   Person(s) Educated Patient   Methods Explanation;Demonstration;Verbal cues;Handout   Comprehension Verbalized understanding;Returned demonstration;Verbal cues required             PT Long Term Goals - 03/17/16 1215    PT LONG TERM GOAL #1   Title Patient will report pain level 3/10 max. on NRPS for lower back with walking, household chores  by 04/28/2016   Baseline Pain level 7/10 in lower back   Status New   PT LONG TERM GOAL #2   Title Patient will demonstrate improved perceived disability on Modified Oswestry to 40% or less by 04/13/16  indicating improved function with daily tasks    Status New   PT LONG TERM GOAL #3   Title Patient will demonstrate improved perceived disability on Modified Oswestry to 30% or less by 04/28/16  indicating improved function with daily tasks    Baseline MODI 56%   Status New   PT LONG TERM GOAL #4   Title Patient will be independent with home program for self management of exercises, posture awareness by 04/28/2016   Baseline requires verbal cues  and guidance for appropriate exercises and progression   Status New               Plan - 03/24/16 1103    Clinical Impression Statement Patient tolerated session well with modified exercises to not aggravate pain s/p recent fall. She had decreased warmth and swelling noted following ice at end of session as well. She is progressing well s/p surgery and should continue to progress through protocol with guidance and instruction of therapist.    PT Frequency 2x / week   PT Duration 6 weeks   PT Treatment/Interventions Therapeutic exercise;Electrical Stimulation;Moist Heat;Manual techniques;Patient/family education   PT Next Visit Plan progress exercises for core control, manual therapy for muscle spasms and pain control   PT Home Exercise Plan chin tucks, scapulr retraction, quad and glute sets, hip adduction and  hip abduction in sitting, TrA contracitons; ball roll outs, hip fall outs, side lying clam and reverse clam       Patient will benefit from skilled therapeutic intervention in order to improve the following deficits and impairments:  Decreased strength, Pain, Decreased knowledge of precautions, Decreased activity tolerance, Decreased endurance, Increased muscle spasms, Decreased range of motion  Visit Diagnosis: Muscle weakness (generalized)  Bilateral low back pain without sciatica  Muscle spasm of back     Problem List Patient Active Problem List   Diagnosis Date Noted  . Spondylolisthesis of lumbar region 02/17/2016  . Globus sensation since at least 2005 04/29/2015  . GERD (gastroesophageal reflux disease) 11/17/2014  . Statin intolerance 01/27/2013  . Visit for preventive health examination 01/27/2013  . Multinodular goiter (nontoxic) 08/11/2012  . Overweight (BMI 25.0-29.9) 08/11/2012  . 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 Screening for cervical cancer 08/21/2011  . Screening for breast cancer 08/21/2011  . Hx of adenomatous polyp of colon  10/06/2008  . HYPERLIPIDEMIA 09/15/2008  . DEPRESSION 09/15/2008  . ALLERGIC RHINITIS 09/15/2008  . ECZEMA 09/15/2008  . OSTEOARTHRITIS 09/15/2008    Jomarie Longs PT 03/24/2016, 11:17 AM  [place] [facility] [address] [place], [address] Phone: [phone]   Fax:  [phone]  Name: Madeline Young MRN: [id number] Date of Birth: 09-25-1956

## 2016-03-27 ENCOUNTER — Ambulatory Visit: Payer: 59 | Admitting: Physical Therapy

## 2016-03-27 ENCOUNTER — Encounter: Payer: Self-pay | Admitting: Physical Therapy

## 2016-03-27 DIAGNOSIS — M62830 Muscle spasm of back: Secondary | ICD-10-CM

## 2016-03-27 DIAGNOSIS — M545 Low back pain, unspecified: Secondary | ICD-10-CM

## 2016-03-27 DIAGNOSIS — M6281 Muscle weakness (generalized): Secondary | ICD-10-CM

## 2016-03-27 NOTE — Therapy (Signed)
[place] [facility] [address], [place], [address] Phone: [phone]   Fax:  [phone]  Physical Therapy Treatment  Patient Details  Name: Madeline Young MRN: [id number] Date of Birth: 12-22-1955 Referring Provider: Ophelia Charter MD  Encounter Date: 03/27/2016      PT End of Session - 03/27/16 1914    Visit Number 4   Number of Visits 12   Date for PT Re-Evaluation 04/28/16   Authorization Type 4   Authorization Time Period 10(G code)   PT Start Time 0950   PT Stop Time [id number]   PT Time Calculation (min) 53 min   Activity Tolerance Patient tolerated treatment well;Other (comment)  limited exercises and time due to patient having to conact MD regarding recent fall prior to beginng exercises/treatment today   Behavior During Therapy [facility] for tasks assessed/performed      Past Medical History  Diagnosis Date  . GERD (gastroesophageal reflux disease)   . Insomnia   . Depression   . Hyperlipidemia   . ADHD (attention deficit hyperactivity disorder)   . Arthritis   . Adenoma 10/06/2008    sigmoid 62mm  . Globus sensation   . Psoriatic arthritis ([place])   . Spinal stenosis of lumbar region June 2016    MRI   . Multinodular goiter (nontoxic)   . Statin intolerance 01/27/2013  . Allergy   . Anxiety   . Cataract     bil cateracts removed  . Complication of anesthesia     first colonoscopy pt states she woke up    Past Surgical History  Procedure Laterality Date  . Foot surgery  2006    Right foot , secondary to severe loss of joint [facility])  . Colonoscopy w/ polypectomy    . Upper gastrointestinal endoscopy  2005    With empiric esophageal dilation  . Tonsillectomy    . Biopsy thyroid  2014  . Eye surgery      bilateral cataract surgery w/ lens implant    There were no vitals filed for this visit.      Subjective Assessment - 03/27/16 0953    Subjective Patient reports increased  stiffness along the right side of the back. States she afraid of "messing up" the surgery and reports driving aggravates her pain.    Limitations Sitting;Standing;Walking;House hold activities;Lifting   Patient Stated Goals be able to perform all tasks without difficulty or pain and improve srtrength   Currently in Pain? Yes   Pain Score 6    Pain Location Back   Pain Orientation Right;Left   Pain Descriptors / Indicators Aching   Pain Type Acute pain   Pain Onset More than a month ago  02/17/2016        Objective: Palpation: lumbar spine with increased warmth throughout and + swelling noted; thoracic spine paraspinals with increased spasms right>left with patient standing, decreased with patient prone lying  Treatment: Exercise: patient performed exercises with guidance, verbal and tactile cues and demonstration of PT: Standing: scapular rows, straight arm pull downs and lat pull downs with red resistive band x 15 each at door Side lying: clam and reverse clam with each LE with tactile and verbal cues x 15 reps  Supine: knee fall outs x 15 reps each LE with TA contraction and self monitoring for pelvis rotation/stability, TrA contraction with leg extension -- x 15  Manual therapy: Patient prone in neutral lumbar spine position with 1 pillow under  abdomen, pillow under LE's: STM performed to thoracic paraspinal muscles and gluteal region superficial and deep techniques: goal: decrease pain and spasms  Modalities: Ice pack applied to back following therapeutic activity with patient in prone: x 15 min.; goal: decrease pain/spasms  Patient response to treatment: Decrease in spasms and pain by 33% after performing ice modality indicating decreased muscular guarding. Good demonstration of LE extension with TrA contraction requiring minimal cueing on proper hip positioning to perform with proper muscular activation         PT Education - 03/27/16 1915    Education provided Yes    Education Details HEP: anterior core with LE ext; cued on proper muscular activation for clamshells exercise   Person(s) Educated Patient   Methods Explanation;Demonstration   Comprehension Verbalized understanding;Returned demonstration             PT Long Term Goals - 03/17/16 1215    PT LONG TERM GOAL #1   Title Patient will report pain level 3/10 max. on NRPS for lower back with walking, household chores  by 04/28/2016   Baseline Pain level 7/10 in lower back   Status New   PT LONG TERM GOAL #2   Title Patient will demonstrate improved perceived disability on Modified Oswestry to 40% or less by 04/13/16  indicating improved function with daily tasks    Status New   PT LONG TERM GOAL #3   Title Patient will demonstrate improved perceived disability on Modified Oswestry to 30% or less by 04/28/16  indicating improved function with daily tasks    Baseline MODI 56%   Status New   PT LONG TERM GOAL #4   Title Patient will be independent with home program for self management of exercises, posture awareness by 04/28/2016   Baseline requires verbal cues and guidance for appropriate exercises and progression   Status New               Plan - 03/27/16 1908    Clinical Impression Statement Patient tolerated increased in anterior core progression without increase in symptoms indicating functional carryover between visits. Decreased pain and spasms after ice modalities indicating increased tissue inflammation and muscular spasms and patient will benefit from further skilled therapy to return to prior level of function.    Rehab Potential Good   PT Frequency 2x / week   PT Duration 6 weeks   PT Treatment/Interventions Therapeutic exercise;Electrical Stimulation;Moist Heat;Manual techniques;Patient/family education   PT Next Visit Plan progress exercises for core control, manual therapy for muscle spasms and pain control   PT Home Exercise Plan chin tucks, scapulr retraction, quad and  glute sets, hip adduction and hip abduction in sitting, TrA contracitons; ball roll outs, hip fall outs, side lying clam and reverse clam       Patient will benefit from skilled therapeutic intervention in order to improve the following deficits and impairments:  Decreased strength, Pain, Decreased knowledge of precautions, Decreased activity tolerance, Decreased endurance, Increased muscle spasms, Decreased range of motion  Visit Diagnosis: Muscle weakness (generalized)  Bilateral low back pain without sciatica  Muscle spasm of back  Low back pain, non-specific     Problem List Patient Active Problem List   Diagnosis Date Noted  . Spondylolisthesis of lumbar region 02/17/2016  . Globus sensation since at least 2005 04/29/2015  . GERD (gastroesophageal reflux disease) 11/17/2014  . Statin intolerance 01/27/2013  . Visit for preventive health examination 01/27/2013  . Multinodular goiter (nontoxic) 08/11/2012  . Overweight (BMI 25.0-29.9)  08/11/2012  . 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 Screening for cervical cancer 08/21/2011  . Screening for breast cancer 08/21/2011  . Hx of adenomatous polyp of colon 10/06/2008  . HYPERLIPIDEMIA 09/15/2008  . DEPRESSION 09/15/2008  . ALLERGIC RHINITIS 09/15/2008  . ECZEMA 09/15/2008  . OSTEOARTHRITIS 09/15/2008    Blythe Stanford, SPT 03/27/2016, 7:16 PM  [place] [facility] [address] [place], [address] Phone: [phone]   Fax:  [phone]  Name: Madeline Young MRN: [id number] Date of Birth: [birth year]

## 2016-03-29 ENCOUNTER — Ambulatory Visit: Payer: 59 | Admitting: Physical Therapy

## 2016-04-03 ENCOUNTER — Encounter: Payer: 59 | Admitting: Physical Therapy

## 2016-04-05 ENCOUNTER — Encounter: Payer: 59 | Admitting: Physical Therapy

## 2016-04-05 ENCOUNTER — Encounter: Payer: Self-pay | Admitting: Physical Therapy

## 2016-04-05 ENCOUNTER — Ambulatory Visit: Payer: 59 | Admitting: Physical Therapy

## 2016-04-05 DIAGNOSIS — M6281 Muscle weakness (generalized): Secondary | ICD-10-CM | POA: Diagnosis not present

## 2016-04-05 DIAGNOSIS — M545 Low back pain, unspecified: Secondary | ICD-10-CM

## 2016-04-05 DIAGNOSIS — M62830 Muscle spasm of back: Secondary | ICD-10-CM | POA: Diagnosis not present

## 2016-04-05 DIAGNOSIS — F321 Major depressive disorder, single episode, moderate: Secondary | ICD-10-CM | POA: Diagnosis not present

## 2016-04-05 NOTE — Therapy (Signed)
[place] [facility] [address], [place], [address] Phone: [phone]   Fax:  [phone]  Physical Therapy Treatment  Patient Details  Name: Madeline Young MRN: [id number] Date of Birth: 02-11-1956 Referring Provider: Ophelia Charter MD  Encounter Date: 04/05/2016      PT End of Session - 04/05/16 1055    Visit Number 5   Number of Visits 12   Date for PT Re-Evaluation 04/28/16   Authorization Type 5   Authorization Time Period 10(G code)   PT Start Time 0950   PT Stop Time 1037   PT Time Calculation (min) 47 min   Activity Tolerance Patient tolerated treatment well   Behavior During Therapy [facility] for tasks assessed/performed      Past Medical History  Diagnosis Date  . GERD (gastroesophageal reflux disease)   . Insomnia   . Depression   . Hyperlipidemia   . ADHD (attention deficit hyperactivity disorder)   . Arthritis   . Adenoma 10/06/2008    sigmoid 27mm  . Globus sensation   . Psoriatic arthritis ([place])   . Spinal stenosis of lumbar region June 2016    MRI   . Multinodular goiter (nontoxic)   . Statin intolerance 01/27/2013  . Allergy   . Anxiety   . Cataract     bil cateracts removed  . Complication of anesthesia     first colonoscopy pt states she woke up    Past Surgical History  Procedure Laterality Date  . Foot surgery  2006    Right foot , secondary to severe loss of joint [facility])  . Colonoscopy w/ polypectomy    . Upper gastrointestinal endoscopy  2005    With empiric esophageal dilation  . Tonsillectomy    . Biopsy thyroid  2014  . Eye surgery      bilateral cataract surgery w/ lens implant    There were no vitals filed for this visit.      Subjective Assessment - 04/05/16 0950    Subjective Patient states vacuuming increased pain significantly last Saturday. Reports decreased pain the following day. Patient states she has increased pain with prolonged standing and  sitting.    Limitations Sitting;Standing;Walking;House hold activities;Lifting   Patient Stated Goals be able to perform all tasks without difficulty or pain and improve srtrength   Currently in Pain? Yes   Pain Score 7    Pain Location Back   Pain Orientation Left   Pain Descriptors / Indicators Aching   Pain Type Acute pain   Pain Onset More than a month ago  02/17/2016      Objective: Palpation: lumbar spine with increased warmth throughout and + swelling noted; thoracic spine paraspinals with increased spasms right>left with patient standing, decreased with patient prone lying  Treatment: Exercise: patient performed exercises with guidance, verbal and tactile cues and demonstration of PT:  Standing:  scapular rows in standing at [employer]-- x20 #10 straight arm pull downs in standing at [employer] -- x20 #10 Reverse pull up at [facility] -- x20 #15 Low row in standing at [facility] -- x20 #5  Side lying:  Clamshells -- x15 reverse clam with each LE with tactile and verbal cues x 15 reps   Supine:  Knee fall outs x 10 reps each LE with TrA contraction and self monitoring for pelvis rotation/stability TrA contraction with leg extension -- x 15 bilaterally Hamstring bowstring stretch -- 3 x 20 sec hold bilaterally with towel  Manual therapy: Patient prone in neutral lumbar spine position with 1 pillow under abdomen, pillow under LE's: STM performed to thoracic paraspinal muscles and gluteal region superficial and deep techniques: goal: decrease pain and spasms  Patient response to treatment:  Decrease in spasms and pain by 33% after performing manual therapy indicating decreased muscular guarding. Good demonstration of LE extension with TrA contraction requiring minimal cueing on proper hip positioning to perform with proper muscular activation          PT Education - 04/05/16 1056    Education provided Yes   Education Details [id number] pull up at [place], hip extension with TrA  contraction   Person(s) Educated Patient   Methods Explanation;Demonstration   Comprehension Verbalized understanding;Returned demonstration             PT Long Term Goals - 03/17/16 1215    PT LONG TERM GOAL #1   Title Patient will report pain level 3/10 max. on NRPS for lower back with walking, household chores  by 04/28/2016   Baseline Pain level 7/10 in lower back   Status New   PT LONG TERM GOAL #2   Title Patient will demonstrate improved perceived disability on Modified Oswestry to 40% or less by 04/13/16  indicating improved function with daily tasks    Status New   PT LONG TERM GOAL #3   Title Patient will demonstrate improved perceived disability on Modified Oswestry to 30% or less by 04/28/16  indicating improved function with daily tasks    Baseline MODI 56%   Status New   PT LONG TERM GOAL #4   Title Patient will be independent with home program for self management of exercises, posture awareness by 04/28/2016   Baseline requires verbal cues and guidance for appropriate exercises and progression   Status New               Plan - 04/05/16 1057    Clinical Impression Statement Patient demonstrates increased resting pain and symptoms upon entering the clinic secondary to increased vaccuming at home. Educated patient to avoid vacuuming, raking, sweeping, and activities that require prolonged lumbar flexion. Patient continues to demonstrated decreased strength and endurance in the anterior and posterior core stabilizers and will benefit from further skilled therapy to return to prior level of function.    Rehab Potential Good   PT Frequency 2x / week   PT Duration 6 weeks   PT Treatment/Interventions Therapeutic exercise;Electrical Stimulation;Moist Heat;Manual techniques;Patient/family education   PT Next Visit Plan progress exercises for core control, manual therapy for muscle spasms and pain control   PT Home Exercise Plan chin tucks, scapulr retraction, quad and  glute sets, hip adduction and hip abduction in sitting, TrA contracitons; ball roll outs, hip fall outs, side lying clam and reverse clam       Patient will benefit from skilled therapeutic intervention in order to improve the following deficits and impairments:  Decreased strength, Pain, Decreased knowledge of precautions, Decreased activity tolerance, Decreased endurance, Increased muscle spasms, Decreased range of motion  Visit Diagnosis: Muscle weakness (generalized)  Bilateral low back pain without sciatica     Problem List Patient Active Problem List   Diagnosis Date Noted  . Spondylolisthesis of lumbar region 02/17/2016  . Globus sensation since at least 2005 04/29/2015  . GERD (gastroesophageal reflux disease) 11/17/2014  . Statin intolerance 01/27/2013  . Visit for preventive health examination 01/27/2013  . Multinodular goiter (nontoxic) 08/11/2012  . Overweight (BMI 25.0-29.9) 08/11/2012  . Psoriatic  arthritis, destructive type ([place])   . Screening for cervical cancer 08/21/2011  . Screening for breast cancer 08/21/2011  . Hx of adenomatous polyp of colon 10/06/2008  . HYPERLIPIDEMIA 09/15/2008  . DEPRESSION 09/15/2008  . ALLERGIC RHINITIS 09/15/2008  . ECZEMA 09/15/2008  . OSTEOARTHRITIS 09/15/2008    Blythe Stanford, SPT 04/05/2016, 11:02 AM  [place] [facility] [address], [place], [address] Phone: [phone]   Fax:  [phone]  Name: Madeline Young MRN: [id number] Date of Birth: 1955/11/20

## 2016-04-07 ENCOUNTER — Ambulatory Visit: Payer: 59 | Admitting: Physical Therapy

## 2016-04-07 ENCOUNTER — Encounter: Payer: Self-pay | Admitting: Physical Therapy

## 2016-04-07 DIAGNOSIS — M545 Low back pain, unspecified: Secondary | ICD-10-CM

## 2016-04-07 DIAGNOSIS — M62830 Muscle spasm of back: Secondary | ICD-10-CM | POA: Diagnosis not present

## 2016-04-07 DIAGNOSIS — M6281 Muscle weakness (generalized): Secondary | ICD-10-CM

## 2016-04-07 NOTE — Therapy (Signed)
[facility] [address], [place], [address] Phone: [phone]   Fax:  [phone]  Physical Therapy Treatment  Patient Details  Name: Madeline Young MRN: [id number] Date of Birth: 08/19/1956 Referring Provider: Ophelia Charter MD  Encounter Date: 04/07/2016      PT End of Session - 04/07/16 1156    Visit Number 6   Number of Visits 12   Date for PT Re-Evaluation 04/28/16   Authorization Type 6   Authorization Time Period 10(G code)   PT Start Time 1113   PT Stop Time 1155   PT Time Calculation (min) 42 min   Activity Tolerance Patient tolerated treatment well;Other (comment)  limited exercises and time due to patient having to conact MD regarding recent fall prior to beginng exercises/treatment today   Behavior During Therapy [facility] for tasks assessed/performed      Past Medical History  Diagnosis Date  . GERD (gastroesophageal reflux disease)   . Insomnia   . Depression   . Hyperlipidemia   . ADHD (attention deficit hyperactivity disorder)   . Arthritis   . Adenoma 10/06/2008    sigmoid 15mm  . Globus sensation   . Psoriatic arthritis ([place])   . Spinal stenosis of lumbar region June 2016    MRI   . Multinodular goiter (nontoxic)   . Statin intolerance 01/27/2013  . Allergy   . Anxiety   . Cataract     bil cateracts removed  . Complication of anesthesia     first colonoscopy pt states she woke up    Past Surgical History  Procedure Laterality Date  . Foot surgery  2006    Right foot , secondary to severe loss of joint [facility])  . Colonoscopy w/ polypectomy    . Upper gastrointestinal endoscopy  2005    With empiric esophageal dilation  . Tonsillectomy    . Biopsy thyroid  2014  . Eye surgery      bilateral cataract surgery w/ lens implant    There were no vitals filed for this visit.      Subjective Assessment - 04/07/16 1158    Subjective Patient states she has increased  pain when transfering from sit to stand from a chair and transfering out of the car. States minimal pain currently that's increased with prolonged sitting   Limitations Sitting;Standing;Walking;House hold activities;Lifting   Patient Stated Goals be able to perform all tasks without difficulty or pain and improve srtrength   Currently in Pain? Yes   Pain Score 6    Pain Location Back   Pain Orientation Left;Right   Pain Descriptors / Indicators Aching   Pain Type Acute pain   Pain Onset More than a month ago  02/17/2016      Objective: Palpation: lumbar spine with increased warmth throughout and + swelling noted; thoracic spine paraspinals with increased spasms right>left with patient standing, decreased with patient prone lying  Treatment: Exercise: patient performed exercises with guidance, verbal and tactile cues and demonstration of PT:  Standing:  Resisting side stepping against grey band -- x5 (forward/backward/laterally) Straight arm pull downs in standing at [employer] -- x20 #12 Low row in standing at [place] -- x20 #10  Kneeling: Kneeling mini squats -- x15 (performed throughout shorter AROM)  Sitting on red physioball: Dynamic Stabilization forward/backward, rotation -- 5 x 5 sec hold Gentle bouncing on ball -- 30sec x 2   One large ice pack placed over lower lumbar/hips  with patient positioned in prone to decreased pain and spasms for 15 min. Skin checked with no adverse signs noted.   Patient response to treatment:  No increase in symptoms during or after treatment session. Good demonstration of mini squats in tall kneeling requiring cueing to perform throughout decreased AROM to decrease increased symptoms.        PT Education - 04/07/16 1157    Education provided Yes   Education Details HEP: tall kneeling mini squats    Person(s) Educated Patient   Methods Explanation;Demonstration   Comprehension Verbalized understanding;Returned demonstration              PT Long Term Goals - 03/17/16 1215    PT LONG TERM GOAL #1   Title Patient will report pain level 3/10 max. on NRPS for lower back with walking, household chores  by 04/28/2016   Baseline Pain level 7/10 in lower back   Status New   PT LONG TERM GOAL #2   Title Patient will demonstrate improved perceived disability on Modified Oswestry to 40% or less by 04/13/16  indicating improved function with daily tasks    Status New   PT LONG TERM GOAL #3   Title Patient will demonstrate improved perceived disability on Modified Oswestry to 30% or less by 04/28/16  indicating improved function with daily tasks    Baseline MODI 56%   Status New   PT LONG TERM GOAL #4   Title Patient will be independent with home program for self management of exercises, posture awareness by 04/28/2016   Baseline requires verbal cues and guidance for appropriate exercises and progression   Status New               Plan - 04/07/16 1153    Clinical Impression Statement Shortened session today as patient was 13 min late for her appointment. Focused on performing standing/weight bearing exercise to address functional limitations such as difficulty/aggravation of symptoms when rising from a chair. Patient continues to demonstrate decreased muscular strength and endurance with exercise and will benefit from further skilled therapy to return to prior level of function.    Rehab Potential Good   PT Frequency 2x / week   PT Duration 6 weeks   PT Treatment/Interventions Therapeutic exercise;Electrical Stimulation;Moist Heat;Manual techniques;Patient/family education   PT Next Visit Plan progress exercises for core control, manual therapy for muscle spasms and pain control   PT Home Exercise Plan chin tucks, scapulr retraction, quad and glute sets, hip adduction and hip abduction in sitting, TrA contracitons; ball roll outs, hip fall outs, side lying clam and reverse clam       Patient will benefit from skilled  therapeutic intervention in order to improve the following deficits and impairments:  Decreased strength, Pain, Decreased knowledge of precautions, Decreased activity tolerance, Decreased endurance, Increased muscle spasms, Decreased range of motion  Visit Diagnosis: Muscle weakness (generalized)  Bilateral low back pain without sciatica     Problem List Patient Active Problem List   Diagnosis Date Noted  . Spondylolisthesis of lumbar region 02/17/2016  . Globus sensation since at least 2005 04/29/2015  . GERD (gastroesophageal reflux disease) 11/17/2014  . Statin intolerance 01/27/2013  . Visit for preventive health examination 01/27/2013  . Multinodular goiter (nontoxic) 08/11/2012  . Overweight (BMI 25.0-29.9) 08/11/2012  . 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 Screening for cervical cancer 08/21/2011  . Screening for breast cancer 08/21/2011  . Hx of adenomatous polyp of colon 10/06/2008  . HYPERLIPIDEMIA  09/15/2008  . DEPRESSION 09/15/2008  . ALLERGIC RHINITIS 09/15/2008  . ECZEMA 09/15/2008  . OSTEOARTHRITIS 09/15/2008    Blythe Stanford, SPT 04/07/2016, 12:02 PM  [facility] [address] [place], [address] Phone: [phone]   Fax:  [phone]  Name: ANNYKA KIENBAUM MRN: [id number] Date of Birth: 07-Dec-1955

## 2016-04-10 ENCOUNTER — Ambulatory Visit: Payer: 59 | Admitting: Physical Therapy

## 2016-04-12 ENCOUNTER — Encounter: Payer: Self-pay | Admitting: Physical Therapy

## 2016-04-12 ENCOUNTER — Ambulatory Visit: Payer: 59 | Attending: Neurosurgery | Admitting: Physical Therapy

## 2016-04-12 DIAGNOSIS — M545 Low back pain, unspecified: Secondary | ICD-10-CM

## 2016-04-12 DIAGNOSIS — M6281 Muscle weakness (generalized): Secondary | ICD-10-CM | POA: Diagnosis not present

## 2016-04-12 DIAGNOSIS — M62830 Muscle spasm of back: Secondary | ICD-10-CM | POA: Diagnosis not present

## 2016-04-12 NOTE — Therapy (Signed)
[facility] [address] [place], [address] Phone: [phone]   Fax:  [phone]  Physical Therapy Treatment  Patient Details  Name: Madeline Young MRN: [id number] Date of Birth: [birth year] Referring Provider: Ophelia Charter MD  Encounter Date: 04/12/2016      PT End of Session - 04/12/16 1517    Visit Number 7   Number of Visits 12   Date for PT Re-Evaluation 04/28/16   Authorization Type 7   Authorization Time Period 10(G code)   PT Start Time 0933   PT Stop Time 1015   PT Time Calculation (min) 42 min   Activity Tolerance Patient tolerated treatment well;Other (comment)  limited exercises and time due to patient having to conact MD regarding recent fall prior to beginng exercises/treatment today   Behavior During Therapy [facility] for tasks assessed/performed      Past Medical History  Diagnosis Date  . GERD (gastroesophageal reflux disease)   . Insomnia   . Depression   . Hyperlipidemia   . ADHD (attention deficit hyperactivity disorder)   . Arthritis   . Adenoma 10/06/2008    sigmoid 22mm  . Globus sensation   . Psoriatic arthritis ([place])   . Spinal stenosis of lumbar region June 2016    MRI   . Multinodular goiter (nontoxic)   . Statin intolerance 01/27/2013  . Allergy   . Anxiety   . Cataract     bil cateracts removed  . Complication of anesthesia     first colonoscopy pt states she woke up    Past Surgical History  Procedure Laterality Date  . Foot surgery  2006    Right foot , secondary to severe loss of joint [facility])  . Colonoscopy w/ polypectomy    . Upper gastrointestinal endoscopy  2005    With empiric esophageal dilation  . Tonsillectomy    . Biopsy thyroid  2014  . Eye surgery      bilateral cataract surgery w/ lens implant    There were no vitals filed for this visit.      Subjective Assessment - 04/12/16 0938    Subjective Patient states improved ability  with transfering in and out of the car and no longer has pain with performance. Reports minor increase in symptoms after mowing, painting a fence and washing her laundry this weekend.   Limitations Sitting;Standing;Walking;House hold activities;Lifting   Patient Stated Goals be able to perform all tasks without difficulty or pain and improve srtrength   Currently in Pain? Yes   Pain Score 5    Pain Location Back   Pain Orientation Right;Left   Pain Descriptors / Indicators Aching   Pain Type Acute pain   Pain Onset More than a month ago  02/17/2016      Objective: Palpation: lumbar spine with increased warmth throughout and + swelling noted-- improved today; thoracic spine paraspinals with increased spasms right>left   Treatment: Exercise: patient performed exercises with guidance, verbal and tactile cues and demonstration of PT:  Standing:  Resisting side stepping against grey band -- x5 (forward/backward/laterally) Straight arm pull downs in standing at [employer] -- x20 #12 Low row in standing at [facility] -- x20 #10 Balance stones: Side stepping, straight stepping, staggered stance  -- x5 Sit to stands from chair -- x10 with green band around knees.   Sidelying: B Clamshells -- x 20 B reverse clamshells -- x 20  Supine: Leg extension with TrA contraction --  x20   Sitting on red physioball: Dynamic Stabilization forward/backward, rotation -- 5 x 5 sec hold Gentle bouncing on ball -- 30sec x 2  Nustep: 5 min lvl: 1 (end of session: unbilled)  Educated on transferring in and out of the car  Manual Therapy: STM to gluteals and thoracic/lumbar extensors with patient positioned in prone to decrease muscular spasms and pain.   Patient response to treatment:  Decreased spasms by 33% after performing manual therapy. No increase in symptoms during or after treatment session. Good demonstration of sit to stands with cueing to perform with hip abduction.        PT Education -  04/12/16 1514    Education provided Yes   Education Details HEP: Balance stones; instructed to not perform repetitive or prolonged bending activities every day to avoid increased pain in back   Person(s) Educated Patient   Methods Explanation;Demonstration   Comprehension Verbalized understanding;Returned demonstration             PT Long Term Goals - 03/17/16 1215    PT LONG TERM GOAL #1   Title Patient will report pain level 3/10 max. on NRPS for lower back with walking, household chores  by 04/28/2016   Baseline Pain level 7/10 in lower back   Status New   PT LONG TERM GOAL #2   Title Patient will demonstrate improved perceived disability on Modified Oswestry to 40% or less by 04/13/16  indicating improved function with daily tasks    Status New   PT LONG TERM GOAL #3   Title Patient will demonstrate improved perceived disability on Modified Oswestry to 30% or less by 04/28/16  indicating improved function with daily tasks    Baseline MODI 56%   Status New   PT LONG TERM GOAL #4   Title Patient will be independent with home program for self management of exercises, posture awareness by 04/28/2016   Baseline requires verbal cues and guidance for appropriate exercises and progression   Status New               Plan - 04/12/16 1517    Clinical Impression Statement Patient making progress towards long term goals demonstrating improved ability to perform further weight bearing exercises indicating functional carryover between visits. Although patient is improving she continues to demonstrate decreased muscular strength, coordination and endurance with exercise and will benefit from further skilled therapy to return to prior level of function.   Rehab Potential Good   PT Frequency 2x / week   PT Duration 6 weeks   PT Treatment/Interventions Therapeutic exercise;Electrical Stimulation;Moist Heat;Manual techniques;Patient/family education   PT Next Visit Plan progress exercises  for core control, manual therapy for muscle spasms and pain control   PT Home Exercise Plan chin tucks, scapulr retraction, quad and glute sets, hip adduction and hip abduction in sitting, TrA contracitons; ball roll outs, hip fall outs, side lying clam and reverse clam       Patient will benefit from skilled therapeutic intervention in order to improve the following deficits and impairments:  Decreased strength, Pain, Decreased knowledge of precautions, Decreased activity tolerance, Decreased endurance, Increased muscle spasms, Decreased range of motion  Visit Diagnosis: Muscle weakness (generalized)  Bilateral low back pain without sciatica     Problem List Patient Active Problem List   Diagnosis Date Noted  . Spondylolisthesis of lumbar region 02/17/2016  . Globus sensation since at least 2005 04/29/2015  . GERD (gastroesophageal reflux disease) 11/17/2014  . Statin intolerance 01/27/2013  .  Visit for preventive health examination 01/27/2013  . Multinodular goiter (nontoxic) 08/11/2012  . Overweight (BMI 25.0-29.9) 08/11/2012  . 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 Screening for cervical cancer 08/21/2011  . Screening for breast cancer 08/21/2011  . Hx of adenomatous polyp of colon 10/06/2008  . HYPERLIPIDEMIA 09/15/2008  . DEPRESSION 09/15/2008  . ALLERGIC RHINITIS 09/15/2008  . ECZEMA 09/15/2008  . OSTEOARTHRITIS 09/15/2008    Blythe Stanford, SPT 04/12/2016, 3:22 PM  [facility] [address], [place], [address] Phone: [phone]   Fax:  [phone]  Name: AZAYLA PANKONIN MRN: [id number] Date of Birth: November 24, 1955

## 2016-04-13 ENCOUNTER — Other Ambulatory Visit: Payer: Self-pay | Admitting: Internal Medicine

## 2016-04-13 DIAGNOSIS — Z1231 Encounter for screening mammogram for malignant neoplasm of breast: Secondary | ICD-10-CM

## 2016-04-17 ENCOUNTER — Encounter: Payer: Self-pay | Admitting: Physical Therapy

## 2016-04-17 ENCOUNTER — Ambulatory Visit: Payer: 59 | Admitting: Physical Therapy

## 2016-04-17 DIAGNOSIS — M6281 Muscle weakness (generalized): Secondary | ICD-10-CM | POA: Diagnosis not present

## 2016-04-17 DIAGNOSIS — M62830 Muscle spasm of back: Secondary | ICD-10-CM | POA: Diagnosis not present

## 2016-04-17 DIAGNOSIS — M545 Low back pain, unspecified: Secondary | ICD-10-CM

## 2016-04-17 NOTE — Therapy (Signed)
[facility] [address] [place], [address] Phone: [phone]   Fax:  [phone]  Physical Therapy Treatment  Patient Details  Name: TALINA KASTL MRN: [id number] Date of Birth: 1956-07-05 Referring Provider: Ophelia Charter MD  Encounter Date: 04/17/2016      PT End of Session - 04/17/16 0955    Visit Number 8   Number of Visits 12   Date for PT Re-Evaluation 04/28/16   Authorization Type 8   Authorization Time Period 10(G code)   PT Start Time 0936   PT Stop Time 1017   PT Time Calculation (min) 41 min   Activity Tolerance Patient tolerated treatment well;Other (comment)   Behavior During Therapy [facility] for tasks assessed/performed      Past Medical History  Diagnosis Date  . GERD (gastroesophageal reflux disease)   . Insomnia   . Depression   . Hyperlipidemia   . ADHD (attention deficit hyperactivity disorder)   . Arthritis   . Adenoma 10/06/2008    sigmoid 55mm  . Globus sensation   . Psoriatic arthritis ([place])   . Spinal stenosis of lumbar region June 2016    MRI   . Multinodular goiter (nontoxic)   . Statin intolerance 01/27/2013  . Allergy   . Anxiety   . Cataract     bil cateracts removed  . Complication of anesthesia     first colonoscopy pt states she woke up    Past Surgical History  Procedure Laterality Date  . Foot surgery  2006    Right foot , secondary to severe loss of joint [facility])  . Colonoscopy w/ polypectomy    . Upper gastrointestinal endoscopy  2005    With empiric esophageal dilation  . Tonsillectomy    . Biopsy thyroid  2014  . Eye surgery      bilateral cataract surgery w/ lens implant    There were no vitals filed for this visit.      Subjective Assessment - 04/17/16 0940    Subjective Patient states she has increased pain in the left hip and increased achiness radiating down the right leg.    Limitations Sitting;Standing;Walking;House hold  activities;Lifting   Patient Stated Goals be able to perform all tasks without difficulty or pain and improve srtrength   Currently in Pain? Yes   Pain Score 8    Pain Location Back   Pain Orientation Left;Right   Pain Descriptors / Indicators Aching   Pain Type Acute pain;Surgical pain   Pain Onset More than a month ago  02/17/2016      Objective: Palpation: lumbar spine with increased warmth throughout and + swelling noted-- improved today; thoracic spine paraspinals with increased spasms right>left, increased spasms along left lateral hip.   Treatment: Exercise: patient performed exercises with guidance, verbal and tactile cues and demonstration of PT:  Sidelying: B Clamshells -- x 20  Supine: Leg Fall outs with TrA contraction -- x20  Hip Flexion with TrA contraction -- x20  Education: Educated on POC, physiology of pain, movements to perform and movements to avoid  Modalities: [place]: with patient in side lying to decrease pain and spasms to the left hip and the mid back to decrease pain and spasms with ice applied onto the left hip for 15 minutes. No adverse signs and symptoms noted after treatment.   Patient response to treatment:  Decreased spasms by 33% after performing modalities therapy. No increase in symptoms during  or after treatment session. Good demonstration of clamshells with cueing to perform with proper hip position.         PT Education - 04/17/16 0953    Education provided Yes   Education Details Educated to decreased activity at home as to not reaggravate pain   Person(s) Educated Patient   Methods Explanation;Demonstration   Comprehension Returned demonstration;Verbalized understanding             PT Long Term Goals - 03/17/16 1215    PT LONG TERM GOAL #1   Title Patient will report pain level 3/10 max. on NRPS for lower back with walking, household chores  by 04/28/2016   Baseline Pain level 7/10 in lower back   Status New   PT LONG  TERM GOAL #2   Title Patient will demonstrate improved perceived disability on Modified Oswestry to 40% or less by 04/13/16  indicating improved function with daily tasks    Status New   PT LONG TERM GOAL #3   Title Patient will demonstrate improved perceived disability on Modified Oswestry to 30% or less by 04/28/16  indicating improved function with daily tasks    Baseline MODI 56%   Status New   PT LONG TERM GOAL #4   Title Patient will be independent with home program for self management of exercises, posture awareness by 04/28/2016   Baseline requires verbal cues and guidance for appropriate exercises and progression   Status New               Plan - 04/17/16 1542    Clinical Impression Statement Patient returns with increased resting pain from increased activity and performance of heavy chores at home and focused today's session on decreasing pain. Patient demonstrates decreased pain and spasms after modalities treatment indicating decreased spasms and muscular guarding and pt will benefit from further therapy focused on decreasing pain to allow for performance of exercise and return to prior level of function.    Rehab Potential Good   PT Frequency 2x / week   PT Duration 6 weeks   PT Treatment/Interventions Therapeutic exercise;Electrical Stimulation;Moist Heat;Manual techniques;Patient/family education   PT Next Visit Plan progress exercises for core control, manual therapy for muscle spasms and pain control   PT Home Exercise Plan chin tucks, scapulr retraction, quad and glute sets, hip adduction and hip abduction in sitting, TrA contracitons; ball roll outs, hip fall outs, side lying clam and reverse clam       Patient will benefit from skilled therapeutic intervention in order to improve the following deficits and impairments:  Decreased strength, Pain, Decreased knowledge of precautions, Decreased activity tolerance, Decreased endurance, Increased muscle spasms, Decreased  range of motion  Visit Diagnosis: Muscle weakness (generalized)  Bilateral low back pain without sciatica     Problem List Patient Active Problem List   Diagnosis Date Noted  . Spondylolisthesis of lumbar region 02/17/2016  . Globus sensation since at least 2005 04/29/2015  . GERD (gastroesophageal reflux disease) 11/17/2014  . Statin intolerance 01/27/2013  . Visit for preventive health examination 01/27/2013  . Multinodular goiter (nontoxic) 08/11/2012  . Overweight (BMI 25.0-29.9) 08/11/2012  . 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 Screening for cervical cancer 08/21/2011  . Screening for breast cancer 08/21/2011  . Hx of adenomatous polyp of colon 10/06/2008  . HYPERLIPIDEMIA 09/15/2008  . DEPRESSION 09/15/2008  . ALLERGIC RHINITIS 09/15/2008  . ECZEMA 09/15/2008  . OSTEOARTHRITIS 09/15/2008    Blythe Stanford, SPT 04/18/2016, 7:49 AM  [facility] [address] [place], [address] Phone: [phone]   Fax:  [phone]  Name: HAILEN SHERIN MRN: [id number] Date of Birth: [birth year]

## 2016-04-18 DIAGNOSIS — E041 Nontoxic single thyroid nodule: Secondary | ICD-10-CM | POA: Diagnosis not present

## 2016-04-19 ENCOUNTER — Encounter: Payer: Self-pay | Admitting: Physical Therapy

## 2016-04-19 ENCOUNTER — Ambulatory Visit: Payer: 59 | Admitting: Physical Therapy

## 2016-04-19 DIAGNOSIS — M545 Low back pain, unspecified: Secondary | ICD-10-CM

## 2016-04-19 DIAGNOSIS — M62830 Muscle spasm of back: Secondary | ICD-10-CM

## 2016-04-19 DIAGNOSIS — M6281 Muscle weakness (generalized): Secondary | ICD-10-CM

## 2016-04-19 NOTE — Therapy (Signed)
[facility] [address] [place], [address] Phone: [phone]   Fax:  [phone]  Physical Therapy Treatment  Patient Details  Name: Madeline Young MRN: [id number] Date of Birth: 1956-10-09 Referring Provider: Ophelia Charter MD  Encounter Date: 04/19/2016      PT End of Session - 04/19/16 1934    Visit Number 9   Number of Visits 12   Date for PT Re-Evaluation 04/28/16   Authorization Type 9   Authorization Time Period 10(G code)   PT Start Time [id number]   PT Stop Time 1015   PT Time Calculation (min) 40 min   Activity Tolerance Patient tolerated treatment well;Other (comment)   Behavior During Therapy WFL for tasks assessed/performed      Past Medical History  Diagnosis Date  . GERD (gastroesophageal reflux disease)   . Insomnia   . Depression   . Hyperlipidemia   . ADHD (attention deficit hyperactivity disorder)   . Arthritis   . Adenoma 10/06/2008    sigmoid 80mm  . Globus sensation   . Psoriatic arthritis ([place])   . Spinal stenosis of lumbar region June 2016    MRI   . Multinodular goiter (nontoxic)   . Statin intolerance 01/27/2013  . Allergy   . Anxiety   . Cataract     bil cateracts removed  . Complication of anesthesia     first colonoscopy pt states she woke up    Past Surgical History  Procedure Laterality Date  . Foot surgery  2006    Right foot , secondary to severe loss of joint [facility])  . Colonoscopy w/ polypectomy    . Upper gastrointestinal endoscopy  2005    With empiric esophageal dilation  . Tonsillectomy    . Biopsy thyroid  2014  . Eye surgery      bilateral cataract surgery w/ lens implant    There were no vitals filed for this visit.      Subjective Assessment - 04/19/16 0946    Subjective Patient states she continues to have left hip pain and achiness but it is improved compared to the previous visit. Patient states increased concern over pain in  the hip not improving.   Limitations Sitting;Standing;Walking;House hold activities;Lifting   Patient Stated Goals be able to perform all tasks without difficulty or pain and improve srtrength   Currently in Pain? Yes   Pain Score 6    Pain Location Back   Pain Orientation Right;Left   Pain Descriptors / Indicators Aching   Pain Type Acute pain;Surgical pain   Pain Onset More than a month ago  02/17/2016      Objective: Palpation: lumbar spine with increased warmth throughout and + swelling noted-- improved today; Increased spasms along the hip abductors over the TFL and glute medius on the L. Increased lumbar flexion when ambulating upon entering the clinic.   Treatment: Exercise: patient performed exercises with guidance, verbal and tactile cues and demonstration of PT:  Sitting: B Clamshells -- x 20 with green band Ball squeeze/glute squeeze -- x 30 Ball roll outs under both feet -- x20   Standing: Marching on balance stones -- 2 min Tandem stepping on balance stones -- x32min each side Side stepping on balance stones -- x5 each way Airex beam tandem walking -- x5 Single leg stance -- 20 sec x3  Education: Educated on POC, physiology of pain, movements to perform and movements to avoid  Manual  Therapy STM to the L glute medius and TFL musculature utilizing superficial and deep techniques to decrease increased pain and spasms  Patient response to treatment:  Decreased spasms by 75% after performing manual therapy and therapeutic exercise. No increase in symptoms during or after treatment session. Good demonstration of clamshells with green band requiring minimal cueing to perform with proper hip position        PT Education - 04/19/16 1934    Education provided Yes   Education Details Educated to continue decreasing activity at home and to continue HEP   Person(s) Educated Patient   Methods Explanation   Comprehension Verbalized understanding             PT  Long Term Goals - 03/17/16 1215    PT LONG TERM GOAL #1   Title Patient will report pain level 3/10 max. on NRPS for lower back with walking, household chores  by 04/28/2016   Baseline Pain level 7/10 in lower back   Status New   PT LONG TERM GOAL #2   Title Patient will demonstrate improved perceived disability on Modified Oswestry to 40% or less by 04/13/16  indicating improved function with daily tasks    Status New   PT LONG TERM GOAL #3   Title Patient will demonstrate improved perceived disability on Modified Oswestry to 30% or less by 04/28/16  indicating improved function with daily tasks    Baseline MODI 56%   Status New   PT LONG TERM GOAL #4   Title Patient will be independent with home program for self management of exercises, posture awareness by 04/28/2016   Baseline requires verbal cues and guidance for appropriate exercises and progression   Status New               Plan - 04/19/16 1918    Clinical Impression Statement Patient returns with decreased pain compared to the previous visit indicaitng functional carryover between visits. Patient responds well to manual therapy techniques with significant decrease in pain post performing STM indicating improved tissue elasticity and decreased muscle guarding. Although patient is improving, she continues to demonstrate decreased pain and spasms and will benefit from further skilled therapy to return to prior level of function.    Rehab Potential Good   PT Frequency 2x / week   PT Duration 6 weeks   PT Treatment/Interventions Therapeutic exercise;Electrical Stimulation;Moist Heat;Manual techniques;Patient/family education   PT Next Visit Plan progress exercises for core control, manual therapy for muscle spasms and pain control   PT Home Exercise Plan chin tucks, scapulr retraction, quad and glute sets, hip adduction and hip abduction in sitting, TrA contracitons; ball roll outs, hip fall outs, side lying clam and reverse clam        Patient will benefit from skilled therapeutic intervention in order to improve the following deficits and impairments:  Decreased strength, Pain, Decreased knowledge of precautions, Decreased activity tolerance, Decreased endurance, Increased muscle spasms, Decreased range of motion  Visit Diagnosis: Muscle weakness (generalized)  Bilateral low back pain without sciatica  Muscle spasm of back     Problem List Patient Active Problem List   Diagnosis Date Noted  . Spondylolisthesis of lumbar region 02/17/2016  . Globus sensation since at least 2005 04/29/2015  . GERD (gastroesophageal reflux disease) 11/17/2014  . Statin intolerance 01/27/2013  . Visit for preventive health examination 01/27/2013  . Multinodular goiter (nontoxic) 08/11/2012  . Overweight (BMI 25.0-29.9) 08/11/2012  . 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  Screening for cervical cancer 08/21/2011  . Screening for breast cancer 08/21/2011  . Hx of adenomatous polyp of colon 10/06/2008  . HYPERLIPIDEMIA 09/15/2008  . DEPRESSION 09/15/2008  . ALLERGIC RHINITIS 09/15/2008  . ECZEMA 09/15/2008  . OSTEOARTHRITIS 09/15/2008    Blythe Stanford, SPT 04/20/2016, 9:21 AM  [facility] [address], [place], [address] Phone: [phone]   Fax:  [phone]  Name: Madeline Young MRN: [id number] Date of Birth: 1955/10/21

## 2016-04-24 ENCOUNTER — Encounter: Payer: Self-pay | Admitting: Physical Therapy

## 2016-04-24 ENCOUNTER — Ambulatory Visit: Payer: 59 | Admitting: Physical Therapy

## 2016-04-24 DIAGNOSIS — M62830 Muscle spasm of back: Secondary | ICD-10-CM

## 2016-04-24 DIAGNOSIS — M545 Low back pain, unspecified: Secondary | ICD-10-CM

## 2016-04-24 DIAGNOSIS — M6281 Muscle weakness (generalized): Secondary | ICD-10-CM | POA: Diagnosis not present

## 2016-04-24 NOTE — Therapy (Signed)
[facility] [address] [place], [address] Phone: [phone]   Fax:  [phone]  Physical Therapy Treatment  Patient Details  Name: Madeline Young MRN: [id number] Date of Birth: [birth year] Referring Provider: Ophelia Charter MD  Encounter Date: 04/24/2016      PT End of Session - 04/24/16 1026    Visit Number 10   Number of Visits 12   Date for PT Re-Evaluation 04/28/16   Authorization Type 10   Authorization Time Period 10(G code)   PT Start Time 0933   PT Stop Time 1020   PT Time Calculation (min) 47 min   Activity Tolerance Patient tolerated treatment well;Other (comment)   Behavior During Therapy WFL for tasks assessed/performed      Past Medical History  Diagnosis Date  . GERD (gastroesophageal reflux disease)   . Insomnia   . Depression   . Hyperlipidemia   . ADHD (attention deficit hyperactivity disorder)   . Arthritis   . Adenoma 10/06/2008    sigmoid 54mm  . Globus sensation   . Psoriatic arthritis ([place])   . Spinal stenosis of lumbar region June 2016    MRI   . Multinodular goiter (nontoxic)   . Statin intolerance 01/27/2013  . Allergy   . Anxiety   . Cataract     bil cateracts removed  . Complication of anesthesia     first colonoscopy pt states she woke up    Past Surgical History  Procedure Laterality Date  . Foot surgery  2006    Right foot , secondary to severe loss of joint [facility])  . Colonoscopy w/ polypectomy    . Upper gastrointestinal endoscopy  2005    With empiric esophageal dilation  . Tonsillectomy    . Biopsy thyroid  2014  . Eye surgery      bilateral cataract surgery w/ lens implant    There were no vitals filed for this visit.      Subjective Assessment - 04/24/16 0935    Subjective Patient states she has minimal soreness in her anterior thighs from increased walking. Reports minimal hip/back pain on the left side.    Limitations  Sitting;Standing;Walking;House hold activities;Lifting   Patient Stated Goals be able to perform all tasks without difficulty or pain and improve srtrength   Currently in Pain? Yes   Pain Score 5    Pain Location Back   Pain Orientation Left   Pain Descriptors / Indicators Aching   Pain Type Acute pain;Surgical pain   Pain Onset More than a month ago  02/17/2016      Objective: Palpation:  Increased spasms along the hip abductors over the TFL and glute medius on the L. Increased lumbar flexion when ambulating upon entering the clinic. Improved antalgic gait today with less guarded posture when entering the clinic.   Outcome Measure: MODI: 45%  Treatment: Exercise: patient performed exercises with guidance, verbal and tactile cues and demonstration of PT:  Sitting on red physioball overhead tricep ext-- x15 #3  Standing: Scapular retraction at [place] -- x 25 #10 Low row at [facility] -- x 25 #10 Straight arm pull downs at [employer] -- x25 #15 Monster walks -- 41ft x6 with red band  Total gym squats with ball squeeze -- 2 x 30 Modified [place] on balance stones -- x 10 Forward/backward walking on balance stones -- x 8  Education: Educated on POC, physiology of pain, movements to perform and movements  to avoid  Manual Therapy STM to the L glute medius and TFL musculature utilizing superficial and deep techniques to decrease increased pain and spasms in sidelying  Patient response to treatment:  Decreased spasms by 66% after performing manual therapy and therapeutic exercise. No increase in symptoms during or after treatment session. Good demonstration of monster walks with red band requiring minimal cueing to perform with proper hip position        PT Education - May 16, 2016 1027    Education provided Yes   Education Details HEP: Balance stones and monster walks   Person(s) Educated Patient   Methods Explanation;Demonstration   Comprehension Verbalized understanding;Returned  demonstration             PT Long Term Goals - May 16, 2016 1030    PT LONG TERM GOAL #1   Title Patient will report pain level 3/10 max. on NRPS for lower back with walking, household chores  by 04/28/2016   Baseline Pain level 7/10 in lower back (05/16/2016: 5/10 in lower back)   Status On-going   PT LONG TERM GOAL #2   Title Patient will demonstrate improved perceived disability on Modified Oswestry to 40% or less by 04/13/16  indicating improved function with daily tasks    Baseline --   Status On-going   PT LONG TERM GOAL #3   Title Patient will demonstrate improved perceived disability on Modified Oswestry to 30% or less by 04/28/16  indicating improved function with daily tasks    Baseline MODI 56% (05-16-2016: MODI: 45%)   Status On-going   PT LONG TERM GOAL #4   Title Patient will be independent with home program for self management of exercises, posture awareness by 04/28/2016   Baseline requires verbal cues and guidance for appropriate exercises and progression (05-16-16: Required moderate cueing for positioning and progression)   Status On-going               Plan - 05-16-2016 1033    Clinical Impression Statement Patient making progress towards long term goals demonstrating improved coordination and stabilization with exercises, improved strength, and improved modified oswestry scores. Although patient is improving she continues to experience decreased muscular endurane and  will benefit from further skilled therapy to return to prior level of function.    Rehab Potential Good   PT Frequency 2x / week   PT Duration 6 weeks   PT Treatment/Interventions Therapeutic exercise;Electrical Stimulation;Moist Heat;Manual techniques;Patient/family education   PT Next Visit Plan progress exercises for core control, manual therapy for muscle spasms and pain control   PT Home Exercise Plan chin tucks, scapulr retraction, quad and glute sets, hip adduction and hip abduction in sitting, TrA  contracitons; ball roll outs, hip fall outs, side lying clam and reverse clam       Patient will benefit from skilled therapeutic intervention in order to improve the following deficits and impairments:  Decreased strength, Pain, Decreased knowledge of precautions, Decreased activity tolerance, Decreased endurance, Increased muscle spasms, Decreased range of motion  Visit Diagnosis: Muscle weakness (generalized)  Bilateral low back pain without sciatica  Muscle spasm of back       G-Codes - May 16, 2016 1116    Functional Assessment Tool Used modified oswestry, pain scale, clinical judgment   Functional Limitation Mobility: Walking and moving around   Mobility: Walking and Moving Around Current Status [id number]) At least 40 percent but less than 60 percent impaired, limited or restricted   Mobility: Walking and Moving Around Goal Status [id number]) At least  20 percent but less than 40 percent impaired, limited or restricted      Problem List Patient Active Problem List   Diagnosis Date Noted  . Spondylolisthesis of lumbar region 02/17/2016  . Globus sensation since at least 2005 04/29/2015  . GERD (gastroesophageal reflux disease) 11/17/2014  . Statin intolerance 01/27/2013  . Visit for preventive health examination 01/27/2013  . Multinodular goiter (nontoxic) 08/11/2012  . Overweight (BMI 25.0-29.9) 08/11/2012  . 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 Screening for cervical cancer 08/21/2011  . Screening for breast cancer 08/21/2011  . Hx of adenomatous polyp of colon 10/06/2008  . HYPERLIPIDEMIA 09/15/2008  . DEPRESSION 09/15/2008  . ALLERGIC RHINITIS 09/15/2008  . ECZEMA 09/15/2008  . OSTEOARTHRITIS 09/15/2008    Blythe Stanford, SPT 04/24/2016, 11:24 AM  [facility] [address] [place], [address] Phone: [phone]   Fax:  [phone]  Name: SHATIMA ARRAMBIDE MRN: [id number] Date of  Birth: October 03, 1956

## 2016-04-26 ENCOUNTER — Ambulatory Visit: Payer: 59 | Admitting: Physical Therapy

## 2016-04-26 DIAGNOSIS — E042 Nontoxic multinodular goiter: Secondary | ICD-10-CM | POA: Diagnosis not present

## 2016-05-01 ENCOUNTER — Encounter: Payer: Self-pay | Admitting: Physical Therapy

## 2016-05-01 ENCOUNTER — Ambulatory Visit: Payer: 59 | Admitting: Physical Therapy

## 2016-05-01 DIAGNOSIS — M545 Low back pain, unspecified: Secondary | ICD-10-CM

## 2016-05-01 DIAGNOSIS — M6281 Muscle weakness (generalized): Secondary | ICD-10-CM | POA: Diagnosis not present

## 2016-05-01 DIAGNOSIS — M62830 Muscle spasm of back: Secondary | ICD-10-CM

## 2016-05-01 NOTE — Therapy (Signed)
[facility] [address], [place], [address] Phone: [phone]   Fax:  [phone]  Physical Therapy Treatment  Patient Details  Name: Madeline Young MRN: [id number] Date of Birth: 10-06-1956 Referring Provider: Ophelia Charter MD  Encounter Date: 05/01/2016      PT End of Session - 05/01/16 0946    Visit Number 11   Number of Visits 24   Date for PT Re-Evaluation 06/12/16   Authorization Type 11   Authorization Time Period 20(G code)   PT Start Time 0938   PT Stop Time 1020   PT Time Calculation (min) 42 min   Activity Tolerance Patient tolerated treatment well   Behavior During Therapy [facility] for tasks assessed/performed      Past Medical History:  Diagnosis Date  . Adenoma 10/06/2008   sigmoid 66m  . ADHD (attention deficit hyperactivity disorder)   . Allergy   . Anxiety   . Arthritis   . Cataract    bil cateracts removed  . Complication of anesthesia    first colonoscopy pt states she woke up  . Depression   . GERD (gastroesophageal reflux disease)   . Globus sensation   . Hyperlipidemia   . Insomnia   . Multinodular goiter (nontoxic)   . Psoriatic arthritis ([place]   . Spinal stenosis of lumbar region June 2016   MRI   . Statin intolerance 01/27/2013    Past Surgical History:  Procedure Laterality Date  . BIOPSY THYROID  2014  . COLONOSCOPY W/ POLYPECTOMY    . EYE SURGERY     bilateral cataract surgery w/ lens implant  . FOOT SURGERY  2006   Right foot , secondary to severe loss of joint ([facility]  . TONSILLECTOMY    . UPPER GASTROINTESTINAL ENDOSCOPY  2005   With empiric esophageal dilation    There were no vitals filed for this visit.      Subjective Assessment - 05/01/16 0940    Subjective Patient reports she has increased pain in left hip and around to anterior thigh region since last visit. She is a little better today than last Wednesday (had to cancel therapy).  She reports she is walking everyday and performing household tasks such as vacuuming and dialy cleaning activites with rest breaks.    Limitations Sitting;Standing;Walking;House hold activities;Lifting   Patient Stated Goals be able to perform all tasks without difficulty or pain and improve srtrength   Currently in Pain? Yes   Pain Score 4    Pain Location Back   Pain Orientation Left   Pain Descriptors / Indicators Aching   Pain Type Acute pain;Surgical pain   Pain Onset More than a month ago  02/17/2016   Pain Frequency Intermittent      Palpation:  Increased spasms along the hip abductors over the TFL and glute medius on the L. Increased lumbar flexion when ambulating upon entering the clinic (significant improvement from previous session). Improved gait pattern today with less guarded posture upon arrival to clinic.   Outcome Measure: MODI: 45%  Treatment: Exercise: patient performed exercises with guidance, verbal and tactile cues and demonstration of PT:   Standing: Scapular retraction at [place]-- x 15 #10 Straight arm pull downs at [place]-- x 15 #15, x 15 10# At wall; scapular retraction with wall angels x 10 reps  Seated at [place] Scapular retraction - x 15 10# Side lying:  Clamshells x 10 reps Prone lying: glute  sets x 10 Knee flexion with holding x 20 seconds x 3 reps each LE  Education: Educated on POC, physiology of pain, movements to perform and movements to avoid  Manual Therapy: Sot tissue mobilization STM to lumbar spine paraspinal muscles bilaterally, L glute medius and TFL musculature utilizing superficial and deep techniques with patient positioned in prone lying Goal:  decrease  pain and spasms   Patient response to treatment:  Decreased spasms by 50% following manual therapy and therapeutic exercise. No increase in symptoms during or after treatment session. Good demonstration of exercises with modification and  proper hip/trunk position to limit  pain in back and left hip region         PT Education - 05/01/16 0944    Education provided Yes   Education Details HEP: continue with exercises for isometrics, side stepping   Person(s) Educated Patient   Methods Explanation;Demonstration   Comprehension Verbalized understanding;Returned demonstration             PT Long Term Goals - 05/01/16 1030      PT LONG TERM GOAL #1   Title Patient will report pain level 3/10 max. on NRPS for lower back with walking, household chores  by 06/12/2016   Baseline Pain level 7/10 in lower back (04/24/16: 5/10 in lower back)    Status Revised     PT LONG TERM GOAL #2   Title Patient will demonstrate improved perceived disability on Modified Oswestry to 40% or less by 04/13/16  indicating improved function with daily tasks    Baseline Modified Oswestry 56% initially Currently 45%   Status Not Met     PT LONG TERM GOAL #3   Title Patient will demonstrate improved perceived disability on Modified Oswestry to 30% or less by 06/12/16  indicating improved function with daily tasks    Baseline MODI 56% (04/24/16: MODI: 45%)   Status Revised     PT LONG TERM GOAL #4   Title Patient will be independent with home program for self management of exercises, posture awareness by 06/12/2016   Baseline requires verbal cues and guidance for appropriate exercises and progression (04/24/16: Required moderate cueing for positioning and progression)   Status Revised               Plan - 05/01/16 1025    Clinical Impression Statement Patient demonstrates slow progress towards goals with continued left hip and back pain that limit her ability to perform personal care (dressing lower body) and household  tasks such as cleaning and vacuuming. She is able to perform most exercises with guidance and minimal to no increase in back pain with appropirate positiongin of trunk. She has current impairment level of 45%  Based on MODI scores and ROM/strength deficits  and pain scale.    Rehab Potential Good   PT Frequency 2x / week   PT Duration 6 weeks   PT Treatment/Interventions Therapeutic exercise;Electrical Stimulation;Moist Heat;Manual techniques;Patient/family education   PT Next Visit Plan progress exercises for core control, manual therapy for muscle spasms and pain control   PT Home Exercise Plan Continue with exercises as instructed; added knee flexion through partial ROM with hold x 20 seconds 3 reps each LE, glute sets x 10 reps      Patient will benefit from skilled therapeutic intervention in order to improve the following deficits and impairments:  Decreased strength, Pain, Decreased knowledge of precautions, Decreased activity tolerance, Decreased endurance, Increased muscle spasms, Decreased range of motion  Visit Diagnosis:  Muscle weakness (generalized)  Muscle spasm of back  Bilateral low back pain without sciatica     Problem List Patient Active Problem List   Diagnosis Date Noted  . Spondylolisthesis of lumbar region 02/17/2016  . Globus sensation since at least 2005 04/29/2015  . GERD (gastroesophageal reflux disease) 11/17/2014  . Statin intolerance 01/27/2013  . Visit for preventive health examination 01/27/2013  . Multinodular goiter (nontoxic) 08/11/2012  . Overweight (BMI 25.0-29.9) 08/11/2012  . 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 Screening for cervical cancer 08/21/2011  . Screening for breast cancer 08/21/2011  . Hx of adenomatous polyp of colon 10/06/2008  . HYPERLIPIDEMIA 09/15/2008  . DEPRESSION 09/15/2008  . ALLERGIC RHINITIS 09/15/2008  . ECZEMA 09/15/2008  . OSTEOARTHRITIS 09/15/2008    Jomarie Longs PT 05/01/2016, 2:34 PM  [facility] [address] [place], [address] Phone: [phone]   Fax:  [phone]  Name: Madeline Young MRN: [id number] Date of Birth: 20-Nov-1955

## 2016-05-03 ENCOUNTER — Encounter: Payer: Self-pay | Admitting: Physical Therapy

## 2016-05-03 ENCOUNTER — Ambulatory Visit: Payer: 59 | Admitting: Physical Therapy

## 2016-05-03 DIAGNOSIS — M62830 Muscle spasm of back: Secondary | ICD-10-CM | POA: Diagnosis not present

## 2016-05-03 DIAGNOSIS — M6281 Muscle weakness (generalized): Secondary | ICD-10-CM

## 2016-05-03 DIAGNOSIS — M545 Low back pain, unspecified: Secondary | ICD-10-CM

## 2016-05-04 NOTE — Therapy (Signed)
[facility] [address] [place], [address] Phone: [phone]   Fax:  [phone]  Physical Therapy Treatment  Patient Details  Name: Madeline Young MRN: [id number] Date of Birth: [birth year] Referring Provider: Ophelia Charter MD  Encounter Date: 05/03/2016      PT End of Session - 05/03/16 0944    Visit Number 12   Number of Visits 24   Date for PT Re-Evaluation 06/12/16   Authorization Type 12   Authorization Time Period 20(G code)   PT Start Time 0936   PT Stop Time 1020   PT Time Calculation (min) 44 min   Activity Tolerance Patient tolerated treatment well;Patient limited by pain   Behavior During Therapy [facility] for tasks assessed/performed      Past Medical History:  Diagnosis Date  . Adenoma 10/06/2008   sigmoid 73m  . ADHD (attention deficit hyperactivity disorder)   . Allergy   . Anxiety   . Arthritis   . Cataract    bil cateracts removed  . Complication of anesthesia    first colonoscopy pt states she woke up  . Depression   . GERD (gastroesophageal reflux disease)   . Globus sensation   . Hyperlipidemia   . Insomnia   . Multinodular goiter (nontoxic)   . [place]   . Spinal stenosis of lumbar region June 2016   MRI   . Statin intolerance 01/27/2013    Past Surgical History:  Procedure Laterality Date  . BIOPSY THYROID  2014  . COLONOSCOPY W/ POLYPECTOMY    . EYE SURGERY     bilateral cataract surgery w/ lens implant  . FOOT SURGERY  2006   Right foot , secondary to severe loss of joint ([facility]  . TONSILLECTOMY    . UPPER GASTROINTESTINAL ENDOSCOPY  2005   With empiric esophageal dilation    There were no vitals filed for this visit.      Subjective Assessment - 05/03/16 0938    Subjective Patient reports previous session helped with working on lower back spasms and swelling. She reports she feels she is shifted and still having swelling and  pain on left side of lower back that is not resolving with rest/ice and is preventing her from regular exercise and activity around the home/community.    Limitations Sitting;Standing;Walking;House hold activities;Lifting   Patient Stated Goals be able to perform all tasks without difficulty or pain and improve strength   Currently in Pain? Yes   Pain Score 5    Pain Location Back   Pain Orientation Left;Lower   Pain Descriptors / Indicators Aching;Heaviness;Tightness   Pain Type Acute pain;Surgical pain  surgery 02/17/2016   Pain Onset More than a month ago   Pain Frequency Constant      Objective: Palpation: + pain and swelling lower lumbar spine with firmness palpable along incision and to the left>right today. + spasms palpable along glut med left>right as well; mild warmth noted around incision lumbar spine region Gait: antalgic with decreased trunk rotation and guarded posture AROM: both LE's hip to ankle WFL's with improved ability to raise left LE into hip flexion while standing (as compared to previous sessions)  Treatment: Exercise: patient performed exercises with guidance, verbal and tactile cues and demonstration of PT: Standing: Standing hip abduction with toe tap and extension with toe tap 3 x 5 reps each LE glute sets standing and prone lying x 10 Re assessed home exercises to  perform for core stabilization including resistive rows in standing with core activation x 10, seated hip adduction with ball and glute sets, hip abduction with resistive band Supine lying: core activation with knee fall outs x 10 Side lying : clamshells x 10    Manual Therapy:  Sot tissue mobilization STM to lumbar spine paraspinal muscles bilaterally, L glute medius musculature:superficial  techniques with patient positioned in prone lying over 1 pillow:  Goal:  decrease  pain and spasms   Ice pack applied to lumbar spine following STM with patient positioned prone with pillow under  abdomen.: no adverse reactions noted: lower back right of the incision remained warm even following ice application  Patient response to treatment: Patient with improved soft tissue elasticity and able to transfer off treatment table with less difficulty following treatment. She demonstrated good knowledge of home exercises and positioning to assist with pain control and progress strength and motor control of core muscles. Pain level remained 4-5/10 at end of session         PT Education - 05/03/16 0943    Education provided Yes   Education Details HEP: continue with isometric, stabilization, use of ice/heat   Person(s) Educated Patient   Methods Explanation;Demonstration   Comprehension Verbalized understanding;Returned demonstration             PT Long Term Goals - 05/01/16 1030      PT LONG TERM GOAL #1   Title Patient will report pain level 3/10 max. on NRPS for lower back with walking, household chores  by 06/12/2016   Baseline Pain level 7/10 in lower back (04/24/16: 5/10 in lower back)    Status Revised     PT LONG TERM GOAL #2   Title Patient will demonstrate improved perceived disability on Modified Oswestry to 40% or less by 04/13/16  indicating improved function with daily tasks    Baseline Modified Oswestry 56% initially Currently 45%   Status Not Met     PT LONG TERM GOAL #3   Title Patient will demonstrate improved perceived disability on Modified Oswestry to 30% or less by 06/12/16  indicating improved function with daily tasks    Baseline MODI 56% (04/24/16: MODI: 45%)   Status Revised     PT LONG TERM GOAL #4   Title Patient will be independent with home program for self management of exercises, posture awareness by 06/12/2016   Baseline requires verbal cues and guidance for appropriate exercises and progression (04/24/16: Required moderate cueing for positioning and progression)   Status Revised               Plan - 05/03/16 0944    Clinical  Impression Statement Patient demonstrates continued pain and swelling in lower back into left hip that seems to be worsening and not resolving with ice and rest. She continues to try to perform activities around the home with rest periods however this may be aggravating her back symptoms. Recommend patient contact MD regarding swelling and feeling like back is "shifted". Increased warmth in lower back right side of incision indicates continued inflammation that is minimally reduced with ice today. Patient will benefit from continued physical therapy intervention for pain control and progressive exercise and re assessment by MD to assist with pain control as she heals from surgery.    Rehab Potential Good   PT Frequency 2x / week   PT Duration 6 weeks   PT Treatment/Interventions Therapeutic exercise;Electrical Stimulation;Moist Heat;Manual techniques;Patient/family education   PT Next Visit  Plan progress exercises for core control, manual therapy for muscle spasms and pain control   PT Home Exercise Plan Continue with exercises as instructed; added knee flexion through partial ROM with hold x 20 seconds 3 reps each LE, glute sets x 10 reps      Patient will benefit from skilled therapeutic intervention in order to improve the following deficits and impairments:  Decreased strength, Pain, Decreased knowledge of precautions, Decreased activity tolerance, Decreased endurance, Increased muscle spasms, Decreased range of motion  Visit Diagnosis: Muscle spasm of back  Muscle weakness (generalized)  Bilateral low back pain without sciatica     Problem List Patient Active Problem List   Diagnosis Date Noted  . Spondylolisthesis of lumbar region 02/17/2016  . Globus sensation since at least 2005 04/29/2015  . GERD (gastroesophageal reflux disease) 11/17/2014  . Statin intolerance 01/27/2013  . Visit for preventive health examination 01/27/2013  . Multinodular goiter (nontoxic) 08/11/2012  .  Overweight (BMI 25.0-29.9) 08/11/2012  . 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 Screening for cervical cancer 08/21/2011  . Screening for breast cancer 08/21/2011  . Hx of adenomatous polyp of colon 10/06/2008  . HYPERLIPIDEMIA 09/15/2008  . DEPRESSION 09/15/2008  . ALLERGIC RHINITIS 09/15/2008  . ECZEMA 09/15/2008  . OSTEOARTHRITIS 09/15/2008    Jomarie Longs PT 05/04/2016, 8:14 AM  [facility] [address], [place], [address] Phone: [phone]   Fax:  [phone]  Name: CHLOEANN ALFRED MRN: [id number] Date of Birth: 1956-08-19

## 2016-05-08 ENCOUNTER — Ambulatory Visit: Payer: 59 | Admitting: Physical Therapy

## 2016-05-08 ENCOUNTER — Encounter: Payer: Self-pay | Admitting: Physical Therapy

## 2016-05-08 DIAGNOSIS — M62830 Muscle spasm of back: Secondary | ICD-10-CM | POA: Diagnosis not present

## 2016-05-08 DIAGNOSIS — M545 Low back pain, unspecified: Secondary | ICD-10-CM

## 2016-05-08 DIAGNOSIS — M6281 Muscle weakness (generalized): Secondary | ICD-10-CM

## 2016-05-08 NOTE — Therapy (Signed)
[facility] [address] [place], [address] Phone: [phone]   Fax:  [phone]  Physical Therapy Treatment  Patient Details  Name: Madeline Young MRN: [id number] Date of Birth: 20-Nov-1955 Referring Provider: Ophelia Charter MD  Encounter Date: 05/08/2016      PT End of Session - 05/08/16 0947    Visit Number 13   Number of Visits 24   Date for PT Re-Evaluation 06/12/16   Authorization Type 13   Authorization Time Period 20(G code)   PT Start Time 0941   PT Stop Time 1021   PT Time Calculation (min) 40 min   Activity Tolerance Patient tolerated treatment well;Patient limited by pain   Behavior During Therapy [facility] for tasks assessed/performed      Past Medical History:  Diagnosis Date  . 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 ADHD (attention deficit hyperactivity disorder)   . Allergy   . Anxiety   . Arthritis   . Cataract    bil cateracts removed  . Complication of anesthesia    first colonoscopy pt states she woke up  . Depression   . GERD (gastroesophageal reflux disease)   . Globus sensation   . Hyperlipidemia   . Insomnia   . Multinodular goiter (nontoxic)   . [place]iatic arthritis ([place]   . Spinal stenosis of lumbar region June 2016   MRI   . Statin intolerance 01/27/2013    Past Surgical History:  Procedure Laterality Date  . BIOPSY THYROID  2014  . COLONOSCOPY W/ POLYPECTOMY    . EYE SURGERY     bilateral cataract surgery w/ lens implant  . FOOT SURGERY  2006   Right foot , secondary to severe loss of joint ([facility]  . TONSILLECTOMY    . UPPER GASTROINTESTINAL ENDOSCOPY  2005   With empiric esophageal dilation    There were no vitals filed for this visit.      Subjective Assessment - 05/08/16 0943    Subjective Patient reports she has an appointment with Dr. HMaryjean Kanext week for pain injection. She is walking 2x/day now and is exercising as instructed. She is  noticing slow improvement since previous visit.    Limitations Sitting;Standing;Walking;House hold activities;Lifting   Patient Stated Goals be able to perform all tasks without difficulty or pain and improve srtrength   Currently in Pain? Yes   Pain Score 4    Pain Location Back   Pain Orientation Left;Lower   Pain Descriptors / Indicators Aching;Tightness   Pain Type Acute pain;Surgical pain   Pain Onset More than a month ago   Pain Frequency Constant      Objective:  Gait: more erect posture noted from previous session Palpation; + spasms bilateral paraspinal muscles lower thoracic spine, left gluteal muscles (decreased from previous session), mild warmth and decreased swelling firmness noted along incision   Treatment:  Therapeutic exercise: patient performed exercises with guidance, verbal and tactile cues and demonstration of PT:  Standing: Hip abduction/extension alternating LE's 3 x 5 reps with toe tap Sitting; Hip adduction with ball with glute sets x 10 with 5 second holds Hip abduction with red resistive band x 15 reps Side lying:  Clamshells with red resistive band x 10 each LE with tactile cues for correct hip/trunk alignment Supine lying:  Core activation with hip fall outs/abduction against red resistive band x 10 reps each LE with verbal and tactile cue to perform through correct ROM  with correct alignment  Manual Therapy: Patient prone lying with pillow under lower legs, head supported; STM performed with superficial techniques to decreased spasms, improve soft tissue elasticity thoracic spine paraspinals, along incision to improve soft tissue elasticity, left gluteal muscles to decrease pain/spasms   Patient response to treatment: overall noted decreased spasms along thoracic and lumbar spine/left gluteals with minimal to no tenderness at end of session; patient demonstrated improved hip/trunk alignment with exercises with verbal cues and repetition; improved  ability to ambulate with improved posture awareness and decreased left LE symptoms.   Patient education: re assessed home exercises as outlined above Patient demonstrated good technique with verbal cues      PT Long Term Goals - 05/01/16 1030      PT LONG TERM GOAL #1   Title Patient will report pain level 3/10 max. on NRPS for lower back with walking, household chores  by 06/12/2016   Baseline Pain level 7/10 in lower back (04/24/16: 5/10 in lower back)    Status Revised     PT LONG TERM GOAL #2   Title Patient will demonstrate improved perceived disability on Modified Oswestry to 40% or less by 04/13/16  indicating improved function with daily tasks    Baseline Modified Oswestry 56% initially Currently 45%   Status Not Met     PT LONG TERM GOAL #3   Title Patient will demonstrate improved perceived disability on Modified Oswestry to 30% or less by 06/12/16  indicating improved function with daily tasks    Baseline MODI 56% (04/24/16: MODI: 45%)   Status Revised     PT LONG TERM GOAL #4   Title Patient will be independent with home program for self management of exercises, posture awareness by 06/12/2016   Baseline requires verbal cues and guidance for appropriate exercises and progression (04/24/16: Required moderate cueing for positioning and progression)   Status Revised               Plan - 05/08/16 1045    Clinical Impression Statement Patient demonstrates improvement with decreased spasms, decreased warmth along incision, decreased spasms and left hip pain.She is becoming more active at home indicating improvement in strength and endurance s/p fusion of lower lumbar spine. She continues with decreased strength, increased left hip spasms, pain and weakness and will benefit from physical therapy intervention to progress exercises appropriately in order to return to full function without difficulty.    Rehab Potential Good   PT Frequency 2x / week   PT Duration 6 weeks   PT  Treatment/Interventions Therapeutic exercise;Electrical Stimulation;Moist Heat;Manual techniques;Patient/family education   PT Next Visit Plan progress exercises for core control, manual therapy for muscle spasms and pain control   PT Home Exercise Plan Continue with exercises as instructed; knee flexion through partial ROM with hold x 20 seconds 3 reps each LE, glute sets x 10 reps; standing hip abduction/extension 3 x 5 reps alternate legs      Patient will benefit from skilled therapeutic intervention in order to improve the following deficits and impairments:  Decreased strength, Pain, Decreased knowledge of precautions, Decreased activity tolerance, Decreased endurance, Increased muscle spasms, Decreased range of motion  Visit Diagnosis: Muscle spasm of back  Muscle weakness (generalized)  Bilateral low back pain without sciatica     Problem List Patient Active Problem List   Diagnosis Date Noted  . Spondylolisthesis of lumbar region 02/17/2016  . Globus sensation since at least 2005 04/29/2015  . GERD (gastroesophageal reflux disease) 11/17/2014  .  Statin intolerance 01/27/2013  . Visit for preventive health examination 01/27/2013  . Multinodular goiter (nontoxic) 08/11/2012  . Overweight (BMI 25.0-29.9) 08/11/2012  . 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 Screening for cervical cancer 08/21/2011  . Screening for breast cancer 08/21/2011  . Hx of adenomatous polyp of colon 10/06/2008  . HYPERLIPIDEMIA 09/15/2008  . DEPRESSION 09/15/2008  . ALLERGIC RHINITIS 09/15/2008  . ECZEMA 09/15/2008  . OSTEOARTHRITIS 09/15/2008    Jomarie Longs PT 05/08/2016, 2:07 PM  [facility] [address], [place], [address] Phone: [phone]   Fax:  [phone]  Name: FRANCENA ZENDER MRN: [id number] Date of Birth: [birth year]

## 2016-05-10 ENCOUNTER — Ambulatory Visit: Payer: 59 | Admitting: Physical Therapy

## 2016-05-10 DIAGNOSIS — M5416 Radiculopathy, lumbar region: Secondary | ICD-10-CM | POA: Insufficient documentation

## 2016-05-11 ENCOUNTER — Ambulatory Visit: Payer: 59 | Attending: Neurosurgery | Admitting: Physical Therapy

## 2016-05-11 DIAGNOSIS — M62830 Muscle spasm of back: Secondary | ICD-10-CM | POA: Diagnosis not present

## 2016-05-11 DIAGNOSIS — M6281 Muscle weakness (generalized): Secondary | ICD-10-CM

## 2016-05-11 DIAGNOSIS — M545 Low back pain: Secondary | ICD-10-CM | POA: Diagnosis not present

## 2016-05-11 NOTE — Therapy (Signed)
[facility] [address], [place], [address] Phone: [phone]   Fax:  [phone]  Physical Therapy Treatment  Patient Details  Name: Madeline Young MRN: [id number] Date of Birth: 1955/11/14 Referring Provider: Ophelia Charter MD  Encounter Date: 05/11/2016      PT End of Session - 05/11/16 1105    Visit Number 14   Number of Visits 24   Date for PT Re-Evaluation 06/12/16   Authorization Type 14   Authorization Time Period 20(G code)   PT Start Time 1037   PT Stop Time 1118   PT Time Calculation (min) 41 min   Activity Tolerance Patient tolerated treatment well;Patient limited by pain   Behavior During Therapy [facility] for tasks assessed/performed      Past Medical History:  Diagnosis Date  . 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 ADHD (attention deficit hyperactivity disorder)   . Allergy   . Anxiety   . Arthritis   . Cataract    bil cateracts removed  . Complication of anesthesia    first colonoscopy pt states she woke up  . Depression   . GERD (gastroesophageal reflux disease)   . Globus sensation   . Hyperlipidemia   . Insomnia   . Multinodular goiter (nontoxic)   . Psoriatic arthritis ([place]   . Spinal stenosis of lumbar region June 2016   MRI   . Statin intolerance 01/27/2013    Past Surgical History:  Procedure Laterality Date  . BIOPSY THYROID  2014  . COLONOSCOPY W/ POLYPECTOMY    . EYE SURGERY     bilateral cataract surgery w/ lens implant  . FOOT SURGERY  2006   Right foot , secondary to severe loss of joint ([facility]  . TONSILLECTOMY    . UPPER GASTROINTESTINAL ENDOSCOPY  2005   With empiric esophageal dilation    There were no vitals filed for this visit.      Subjective Assessment - 05/11/16 1038    Subjective Patient reports decreased pain in left hip as compared to the previous session.    Limitations Sitting;Standing;Walking;House hold activities;Lifting   Patient Stated Goals be able to perform all tasks without difficulty or pain and improve srtrength   Currently in Pain? Yes   Pain Score 4    Pain Location Back   Pain Orientation Left;Lower   Pain Descriptors / Indicators Aching   Pain Type Acute pain;Surgical pain   Pain Onset More than a month ago   Pain Frequency Constant      Objective:  Gait: Improved cadence and trunk rotation as compared to previous session Palpation; + mild spasms palpable along bilateral paraspinal muscles lower lumbar spine with mild tenderness along incision; decreased warmth and decreased swelling firmness noted along incision as compared to previous session   Treatment:  Therapeutic exercise: patient performed exercises with guidance, verbal and tactile cues and demonstration of PT:  Standing: Hip abduction/extension alternating LE's 3 x 5 reps with toe tap Sitting; Hip adduction with ball with glute sets x 15 with 5 second holds Hip abduction/clam with green resistive band x 15 reps Side lying:  Clamshells with green resistive band x 10 each LE with tactile cues for correct hip/trunk alignment (unable to tolerate band with right LE today) Quadruped:  Core activation with UE raise x 3 each and LE hip extension with tactile cues/assistance to maintain pelvis in proper alignment x 5-10 reps partial ROM Supine lying:  Core activation with hip fall outs/abduction against green resistive band x 10 reps each LE with verbal and tactile cue to perform through correct ROM with correct alignment  Manual Therapy: Patient prone lying with pillow under lower legs, head supported; STM performed with superficial techniques to decreased spasms and  improve soft tissue elasticity along thoracic paraspinal muscles and along incision to improve soft tissue elasticity  Patient response to treatment: Patient improved positioning and technique with quadruped exercise with verbal cues, tactile cues and assistance to  maintain pelvis level. Overall she is demonstrating decreased spasms in lower lumbar spine and mild to no tenderness following treatment. No increased symptoms in back or LE's with treatment         PT Education - 05/11/16 1105    Education provided Yes   Education Details HEP: added quadruped for hip extension   Person(s) Educated Patient   Methods Explanation;Demonstration;Verbal cues;Tactile cues   Comprehension Verbalized understanding;Returned demonstration;Verbal cues required             PT Long Term Goals - 05/01/16 1030      PT LONG TERM GOAL #1   Title Patient will report pain level 3/10 max. on NRPS for lower back with walking, household chores  by 06/12/2016   Baseline Pain level 7/10 in lower back (04/24/16: 5/10 in lower back)    Status Revised     PT LONG TERM GOAL #2   Title Patient will demonstrate improved perceived disability on Modified Oswestry to 40% or less by 04/13/16  indicating improved function with daily tasks    Baseline Modified Oswestry 56% initially Currently 45%   Status Not Met     PT LONG TERM GOAL #3   Title Patient will demonstrate improved perceived disability on Modified Oswestry to 30% or less by 06/12/16  indicating improved function with daily tasks    Baseline MODI 56% (04/24/16: MODI: 45%)   Status Revised     PT LONG TERM GOAL #4   Title Patient will be independent with home program for self management of exercises, posture awareness by 06/12/2016   Baseline requires verbal cues and guidance for appropriate exercises and progression (04/24/16: Required moderate cueing for positioning and progression)   Status Revised               Plan - 05/11/16 1119    Clinical Impression Statement Patient demonstrates decreased pain and spasm in lower back and left hip region. She was abel to perform exercises with minimal cuing. She required more cuing for advanced exercises in quadruped due to decreased motor control and strength. She will  require additional physical therapy to address weakness, spasms and pain in order to return to prior level of function.    Rehab Potential Good   PT Frequency 2x / week   PT Duration 6 weeks   PT Treatment/Interventions Therapeutic exercise;Electrical Stimulation;Moist Heat;Manual techniques;Patient/family education   PT Next Visit Plan progress exercises for core control, manual therapy for muscle spasms and pain control   PT Home Exercise Plan Continue with exercises as instructed; knee flexion through partial ROM with hold x 20 seconds 3 reps each LE, glute sets x 10 reps; standing hip abduction/extension 3 x 5 reps alternate legs      Patient will benefit from skilled therapeutic intervention in order to improve the following deficits and impairments:  Decreased strength, Pain, Decreased knowledge of precautions, Decreased activity tolerance, Decreased endurance, Increased muscle spasms, Decreased range of motion  Visit Diagnosis:  Muscle spasm of back  Muscle weakness (generalized)     Problem List Patient Active Problem List   Diagnosis Date Noted  . Spondylolisthesis of lumbar region 02/17/2016  . Globus sensation since at least 2005 04/29/2015  . GERD (gastroesophageal reflux disease) 11/17/2014  . Statin intolerance 01/27/2013  . Visit for preventive health examination 01/27/2013  . Multinodular goiter (nontoxic) 08/11/2012  . Overweight (BMI 25.0-29.9) 08/11/2012  . 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 Screening for cervical cancer 08/21/2011  . Screening for breast cancer 08/21/2011  . Hx of adenomatous polyp of colon 10/06/2008  . HYPERLIPIDEMIA 09/15/2008  . DEPRESSION 09/15/2008  . ALLERGIC RHINITIS 09/15/2008  . ECZEMA 09/15/2008  . OSTEOARTHRITIS 09/15/2008    Jomarie Longs PT 05/12/2016, 12:24 PM  [facility] [address] [place], [address] Phone: [phone]   Fax:   [phone]  Name: MERCADEZ HEITMAN MRN: [id number] Date of Birth: [birth year]

## 2016-05-15 ENCOUNTER — Encounter: Payer: 59 | Admitting: Physical Therapy

## 2016-05-16 ENCOUNTER — Ambulatory Visit: Payer: 59 | Admitting: Physical Therapy

## 2016-05-16 ENCOUNTER — Encounter: Payer: Self-pay | Admitting: Physical Therapy

## 2016-05-16 DIAGNOSIS — M62830 Muscle spasm of back: Secondary | ICD-10-CM

## 2016-05-16 DIAGNOSIS — M545 Low back pain, unspecified: Secondary | ICD-10-CM

## 2016-05-16 DIAGNOSIS — M6281 Muscle weakness (generalized): Secondary | ICD-10-CM

## 2016-05-16 NOTE — Therapy (Signed)
[facility] [address], [place], [address] Phone: [phone]   Fax:  [phone]  Physical Therapy Treatment  Patient Details  Name: Madeline Young MRN: [id number] Date of Birth: 1956-07-09 Referring Provider: Ophelia Charter MD  Encounter Date: 05/16/2016      PT End of Session - 05/16/16 1148    Visit Number 15   Number of Visits 24   Date for PT Re-Evaluation 06/12/16   Authorization Type 15   Authorization Time Period 20(G code)   PT Start Time 1058   PT Stop Time 1147   PT Time Calculation (min) 49 min   Activity Tolerance Patient tolerated treatment well;Patient limited by pain   Behavior During Therapy [facility] for tasks assessed/performed      Past Medical History:  Diagnosis Date  . 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 ADHD (attention deficit hyperactivity disorder)   . Allergy   . Anxiety   . Arthritis   . Cataract    bil cateracts removed  . Complication of anesthesia    first colonoscopy pt states she woke up  . Depression   . GERD (gastroesophageal reflux disease)   . Globus sensation   . Hyperlipidemia   . Insomnia   . Multinodular goiter (nontoxic)   . Psoriatic arthritis ([place]   . Spinal stenosis of lumbar region June 2016   MRI   . Statin intolerance 01/27/2013    Past Surgical History:  Procedure Laterality Date  . BIOPSY THYROID  2014  . COLONOSCOPY W/ POLYPECTOMY    . EYE SURGERY     bilateral cataract surgery w/ lens implant  . FOOT SURGERY  2006   Right foot , secondary to severe loss of joint ([facility]  . TONSILLECTOMY    . UPPER GASTROINTESTINAL ENDOSCOPY  2005   With empiric esophageal dilation    There were no vitals filed for this visit.      Subjective Assessment - 05/16/16 1111    Subjective Patient reports her lower back is hurting today centrally and did not sleep well last night and was hot. She did start taking her prednisone taper (12 day)  today. Overall she reports seeing improvement.    Limitations Sitting;Standing;Walking;House hold activities;Lifting   Patient Stated Goals be able to perform all tasks without difficulty or pain and improve srtrength   Currently in Pain? Yes   Pain Score 5    Pain Location Back   Pain Orientation Lower   Pain Descriptors / Indicators Aching   Pain Type Acute pain;Surgical pain   Pain Onset More than a month ago   Pain Frequency Constant      Objective:  Gait:mild forward flexed trunk  Palpation; + mild spasms palpable along bilateral paraspinal muscles lower lumbar spine with mild tenderness along incision; no increased warmth noted and decreased swelling and firmness noted along incision as compared to previous session   Treatment:  Therapeutic exercise: patient performed exercises with guidance, verbal and tactile cues and demonstration of PT:  Standing: Side stepping with resistive band around thighs x 5 sets x 5 steps Sitting; Hip adduction with ball with glute sets x 15 with 5 second holds Side lying:  Clamshells with  tactile cues for correct hip/trunk alignment x 10 reps each Le Reverse clamshells x 10 reps each LE Supine lying: Core control TrA with hook lying resistive band around thighs hip abduction x 10, marching alternating LE's x 10 reps  each Quadruped:  Core activation with  LE hip extension with tactile cues/assistance to maintain pelvis in proper alignment x 5-10 reps partial ROM  Manual Therapy: Soft tissue mobilization: prone lying with pillow under lower legs, head supported; STM performed with superficial techniques along lower thoracic and lumbar paraspinal muscles and along incision to improve soft tissue elasticity and decrease spasms/pain  Patient response to treatment: Patient able to perform all exercises with minimal cuing for correct technique and proper trunk alignment. Patient demonstrated improved ability to control core with quadruped  exercises with verbal and tactile cuing to maintain pelvis level.  No reported increase in symptoms in back or LE's during treatment   Education: instructed to continue with current exercises and add reverse clamshells with demonstration and verbal cues; patient able to return demonstration with cuing      PT Long Term Goals - 05/01/16 1030      PT LONG TERM GOAL #1   Title Patient will report pain level 3/10 max. on NRPS for lower back with walking, household chores  by 06/12/2016   Baseline Pain level 7/10 in lower back (04/24/16: 5/10 in lower back)    Status Revised     PT LONG TERM GOAL #2   Title Patient will demonstrate improved perceived disability on Modified Oswestry to 40% or less by 04/13/16  indicating improved function with daily tasks    Baseline Modified Oswestry 56% initially Currently 45%   Status Not Met     PT LONG TERM GOAL #3   Title Patient will demonstrate improved perceived disability on Modified Oswestry to 30% or less by 06/12/16  indicating improved function with daily tasks    Baseline MODI 56% (04/24/16: MODI: 45%)   Status Revised     PT LONG TERM GOAL #4   Title Patient will be independent with home program for self management of exercises, posture awareness by 06/12/2016   Baseline requires verbal cues and guidance for appropriate exercises and progression (04/24/16: Required moderate cueing for positioning and progression)   Status Revised               Plan - 05/16/16 1148    Clinical Impression Statement Patient demonstrates improving strength, core control and decreased left hip/LE symptoms. She continues to progress towards goals for independence with home program and improved function with daily chores, personal care activities with physical therapy interveniton for guidance of exercises to further improve core control, strength.    Rehab Potential Good   PT Frequency 2x / week   PT Duration 6 weeks   PT Treatment/Interventions Therapeutic  exercise;Electrical Stimulation;Moist Heat;Manual techniques;Patient/family education   PT Next Visit Plan progress exercises for core control, manual therapy for muscle spasms and pain control   PT Home Exercise Plan Continue with exercises as instructed; knee flexion through partial ROM with hold x 20 seconds 3 reps each LE, glute sets x 10 reps; standing hip abduction/extension 3 x 5 reps alternate legs      Patient will benefit from skilled therapeutic intervention in order to improve the following deficits and impairments:  Decreased strength, Pain, Decreased knowledge of precautions, Decreased activity tolerance, Decreased endurance, Increased muscle spasms, Decreased range of motion  Visit Diagnosis: Muscle spasm of back  Muscle weakness (generalized)  Bilateral low back pain without sciatica     Problem List Patient Active Problem List   Diagnosis Date Noted  . Spondylolisthesis of lumbar region 02/17/2016  . Globus sensation since at least 2005 04/29/2015  .  GERD (gastroesophageal reflux disease) 11/17/2014  . Statin intolerance 01/27/2013  . Visit for preventive health examination 01/27/2013  . Multinodular goiter (nontoxic) 08/11/2012  . Overweight (BMI 25.0-29.9) 08/11/2012  . 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 Screening for cervical cancer 08/21/2011  . Screening for breast cancer 08/21/2011  . Hx of adenomatous polyp of colon 10/06/2008  . HYPERLIPIDEMIA 09/15/2008  . DEPRESSION 09/15/2008  . ALLERGIC RHINITIS 09/15/2008  . ECZEMA 09/15/2008  . OSTEOARTHRITIS 09/15/2008    Jomarie Longs PT 05/16/2016, 5:39 PM  [facility] [address] [place], [address] Phone: [phone]   Fax:  [phone]  Name: Madeline Young MRN: [id number] Date of Birth: 23-Mar-1956

## 2016-05-17 ENCOUNTER — Encounter: Payer: 59 | Admitting: Physical Therapy

## 2016-05-23 ENCOUNTER — Ambulatory Visit: Payer: 59 | Admitting: Physical Therapy

## 2016-05-23 ENCOUNTER — Encounter: Payer: Self-pay | Admitting: Physical Therapy

## 2016-05-23 DIAGNOSIS — M545 Low back pain, unspecified: Secondary | ICD-10-CM

## 2016-05-23 DIAGNOSIS — M62830 Muscle spasm of back: Secondary | ICD-10-CM

## 2016-05-23 DIAGNOSIS — M6281 Muscle weakness (generalized): Secondary | ICD-10-CM

## 2016-05-23 NOTE — Therapy (Signed)
[facility] [address] [place], [address] Phone: [phone]   Fax:  [phone]  Physical Therapy Treatment  Patient Details  Name: Madeline Young MRN: [id number] Date of Birth: 1956/05/31 Referring Provider: Ophelia Charter MD  Encounter Date: 05/23/2016      PT End of Session - 05/23/16 0907    Visit Number 16   Number of Visits 24   Date for PT Re-Evaluation 06/12/16   Authorization Type 16   Authorization Time Period 20(G code)   PT Start Time 0905   PT Stop Time 0945   PT Time Calculation (min) 40 min   Activity Tolerance Patient tolerated treatment well;Patient limited by pain   Behavior During Therapy [facility] for tasks assessed/performed      Past Medical History:  Diagnosis Date  . Adenoma 10/06/2008   sigmoid 86m  . ADHD (attention deficit hyperactivity disorder)   . Allergy   . Anxiety   . Arthritis   . Cataract    bil cateracts removed  . Complication of anesthesia    first colonoscopy pt states she woke up  . Depression   . GERD (gastroesophageal reflux disease)   . Globus sensation   . Hyperlipidemia   . Insomnia   . Multinodular goiter (nontoxic)   . [place]iatic arthritis ([place]   . Spinal stenosis of lumbar region June 2016   MRI   . Statin intolerance 01/27/2013    Past Surgical History:  Procedure Laterality Date  . BIOPSY THYROID  2014  . COLONOSCOPY W/ POLYPECTOMY    . EYE SURGERY     bilateral cataract surgery w/ lens implant  . FOOT SURGERY  2006   Right foot , secondary to severe loss of joint ([facility]  . TONSILLECTOMY    . UPPER GASTROINTESTINAL ENDOSCOPY  2005   With empiric esophageal dilation    There were no vitals filed for this visit.      Subjective Assessment - 05/23/16 0907    Subjective Patient reports she is feeling better today and was better yesterday. She reports she was moving around a lot on Saturday and she was able to reduce her  symptoms with rest. she felt she had bricks in her lower back. Currently she is having pain across lower back with tightness.    Limitations Sitting;Standing;Walking;House hold activities;Lifting   Patient Stated Goals be able to perform all tasks without difficulty or pain and improve srtrength   Currently in Pain? Yes   Pain Score 5    Pain Location Back   Pain Orientation Lower   Pain Descriptors / Indicators Aching;Spasm   Pain Type Acute pain;Surgical pain   Pain Onset More than a month ago  surgery 02/17/16   Pain Frequency Constant      Objective:   Palpation; + mild spasms palpable along bilateral paraspinal muscles lower lumbar spine with mild tenderness along incision; no increased warmth noted and decreased swelling and firmness noted along incision as compared to previous session  Treatment:  Therapeutic exercise: patient performed exercises with guidance, verbal and tactile cues and demonstration of PT:  Sitting; Hip adduction with ball with glute sets x 15with 5 second holds Side lying:  Clamshells with  tactile cues for correct hip/trunk alignment x 10 reps each LE Reverse clamshells x 10 reps each LE Supine lying: Core control TrA with hook lying resistive band around thighs hip abduction x 10 Marching alternating LE's x 10 reps each  Bridging partial ROM with ball between knees x 10 reps   Manual Therapy: Soft tissue mobilization: prone lying with pillow under lower legs, head supported; STM performed with superficial techniques along lower thoracic and lumbar paraspinal muscles and along incisionto improve soft tissue elasticity and decrease spasms/pain  Patient response to treatment: Patient demonstrates improved core control with repetition and is able to perform exercises with minimal cuing. Patient with improved soft tissue elasticity following  STM with 50% decreased pain reported following treatment.          PT Education - 05/23/16 0910     Education provided Yes   Education Details HEP: supine and sidelying exercises for stabilizaoitn/strengthening core   Person(s) Educated Patient   Methods Explanation;Demonstration;Verbal cues   Comprehension Verbalized understanding;Returned demonstration;Verbal cues required             PT Long Term Goals - 05/01/16 1030      PT LONG TERM GOAL #1   Title Patient will report pain level 3/10 max. on NRPS for lower back with walking, household chores  by 06/12/2016   Baseline Pain level 7/10 in lower back (04/24/16: 5/10 in lower back)    Status Revised     PT LONG TERM GOAL #2   Title Patient will demonstrate improved perceived disability on Modified Oswestry to 40% or less by 04/13/16  indicating improved function with daily tasks    Baseline Modified Oswestry 56% initially Currently 45%   Status Not Met     PT LONG TERM GOAL #3   Title Patient will demonstrate improved perceived disability on Modified Oswestry to 30% or less by 06/12/16  indicating improved function with daily tasks    Baseline MODI 56% (04/24/16: MODI: 45%)   Status Revised     PT LONG TERM GOAL #4   Title Patient will be independent with home program for self management of exercises, posture awareness by 06/12/2016   Baseline requires verbal cues and guidance for appropriate exercises and progression (04/24/16: Required moderate cueing for positioning and progression)   Status Revised               Plan - 05/23/16 0945    Clinical Impression Statement Patient demonstrates improvement with decreased warmth and improving soft tissue elasticity in lower back around incision. She continues with limitations of decreased core control, strength and endurance.    Rehab Potential Good   PT Frequency 2x / week   PT Duration 6 weeks   PT Treatment/Interventions Therapeutic exercise;Electrical Stimulation;Moist Heat;Manual techniques;Patient/family education   PT Next Visit Plan progress exercises for core control,  manual therapy for muscle spasms and pain control   PT Home Exercise Plan core control, supine and side lying exercises      Patient will benefit from skilled therapeutic intervention in order to improve the following deficits and impairments:  Decreased strength, Pain, Decreased knowledge of precautions, Decreased activity tolerance, Decreased endurance, Increased muscle spasms, Decreased range of motion  Visit Diagnosis: Muscle weakness (generalized)  Muscle spasm of back  Bilateral low back pain without sciatica     Problem List Patient Active Problem List   Diagnosis Date Noted  . Spondylolisthesis of lumbar region 02/17/2016  . Globus sensation since at least 2005 04/29/2015  . GERD (gastroesophageal reflux disease) 11/17/2014  . Statin intolerance 01/27/2013  . Visit for preventive health examination 01/27/2013  . Multinodular goiter (nontoxic) 08/11/2012  . Overweight (BMI 25.0-29.9) 08/11/2012  . 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  Screening for cervical cancer 08/21/2011  . Screening for breast cancer 08/21/2011  . Hx of adenomatous polyp of colon 10/06/2008  . HYPERLIPIDEMIA 09/15/2008  . DEPRESSION 09/15/2008  . ALLERGIC RHINITIS 09/15/2008  . ECZEMA 09/15/2008  . OSTEOARTHRITIS 09/15/2008    Jomarie Longs PT 05/23/2016, 10:33 PM  [facility] [address], [place], [address] Phone: [phone]   Fax:  [phone]  Name: Madeline Young MRN: [id number] Date of Birth: [birth year]

## 2016-06-08 ENCOUNTER — Other Ambulatory Visit: Payer: Self-pay | Admitting: Internal Medicine

## 2016-07-05 ENCOUNTER — Ambulatory Visit: Payer: 59

## 2016-07-10 ENCOUNTER — Ambulatory Visit
Admission: RE | Admit: 2016-07-10 | Discharge: 2016-07-10 | Disposition: A | Discharge status: Home / Self Care | Payer: 59 | Source: Ambulatory Visit | Attending: Internal Medicine | Admitting: Internal Medicine

## 2016-07-10 DIAGNOSIS — Z1231 Encounter for screening mammogram for malignant neoplasm of breast: Secondary | ICD-10-CM

## 2016-07-19 ENCOUNTER — Other Ambulatory Visit: Payer: Self-pay | Admitting: Rheumatology

## 2016-07-19 ENCOUNTER — Ambulatory Visit
Admission: RE | Admit: 2016-07-19 | Discharge: 2016-07-19 | Disposition: A | Discharge status: Home / Self Care | Payer: 59 | Source: Ambulatory Visit | Attending: Rheumatology | Admitting: Rheumatology

## 2016-07-19 DIAGNOSIS — R1031 Right lower quadrant pain: Secondary | ICD-10-CM

## 2016-07-31 ENCOUNTER — Other Ambulatory Visit (HOSPITAL_COMMUNITY): Payer: Self-pay | Admitting: Neurosurgery

## 2016-07-31 DIAGNOSIS — M5416 Radiculopathy, lumbar region: Secondary | ICD-10-CM

## 2016-08-11 ENCOUNTER — Ambulatory Visit
Admission: RE | Admit: 2016-08-11 | Discharge: 2016-08-11 | Disposition: A | Discharge status: Home / Self Care | Payer: 59 | Source: Ambulatory Visit | Attending: Neurosurgery | Admitting: Neurosurgery

## 2016-08-11 DIAGNOSIS — Z981 Arthrodesis status: Secondary | ICD-10-CM | POA: Diagnosis not present

## 2016-08-11 DIAGNOSIS — M5416 Radiculopathy, lumbar region: Secondary | ICD-10-CM | POA: Diagnosis present

## 2016-08-11 DIAGNOSIS — M4726 Other spondylosis with radiculopathy, lumbar region: Secondary | ICD-10-CM | POA: Insufficient documentation

## 2016-08-11 DIAGNOSIS — M4316 Spondylolisthesis, lumbar region: Secondary | ICD-10-CM | POA: Diagnosis not present

## 2016-08-11 DIAGNOSIS — M5116 Intervertebral disc disorders with radiculopathy, lumbar region: Secondary | ICD-10-CM | POA: Diagnosis not present

## 2016-08-11 DIAGNOSIS — M48061 Spinal stenosis, lumbar region without neurogenic claudication: Secondary | ICD-10-CM | POA: Diagnosis not present

## 2016-08-11 LAB — POCT I-STAT CREATININE: CREATININE: 0.7 mg/dL (ref 0.44–1.00)

## 2016-08-11 MED ORDER — GADOBENATE DIMEGLUMINE 529 MG/ML IV SOLN
15.0000 mL | Freq: Once | INTRAVENOUS | Status: AC | PRN
  Administered 2016-08-11: 15 mL via INTRAVENOUS

## 2016-08-21 ENCOUNTER — Other Ambulatory Visit: Payer: Self-pay

## 2016-08-21 NOTE — Telephone Encounter (Signed)
Last seen 01/07/16 last filled  06/08/16 60 1rf advised to schedule appmt with last refill, no appmt scheduled

## 2016-08-25 ENCOUNTER — Telehealth: Payer: Self-pay

## 2016-08-25 ENCOUNTER — Other Ambulatory Visit: Payer: Self-pay | Admitting: Internal Medicine

## 2016-08-25 MED ORDER — CELECOXIB 200 MG PO CAPS: ORAL_CAPSULE | ORAL | 2 refills | Status: DC

## 2016-08-25 NOTE — Telephone Encounter (Signed)
Patient was informed that she will need to keep appointment to receive further refills on celebrex. Patient said she would comply. Refill has been sent in for patient.

## 2016-08-28 ENCOUNTER — Other Ambulatory Visit: Payer: Self-pay | Admitting: Internal Medicine

## 2016-09-04 DIAGNOSIS — F321 Major depressive disorder, single episode, moderate: Secondary | ICD-10-CM | POA: Diagnosis not present

## 2016-10-27 ENCOUNTER — Encounter: Payer: Self-pay | Admitting: Internal Medicine

## 2016-10-27 ENCOUNTER — Ambulatory Visit (INDEPENDENT_AMBULATORY_CARE_PROVIDER_SITE_OTHER): Payer: 59 | Admitting: Internal Medicine

## 2016-10-27 VITALS — BP 136/82 | HR 79 | Temp 98.5°F | Resp 17 | Ht 66.0 in | Wt 178.2 lb

## 2016-10-27 DIAGNOSIS — F988 Other specified behavioral and emotional disorders with onset usually occurring in childhood and adolescence: Secondary | ICD-10-CM

## 2016-10-27 DIAGNOSIS — M25561 Pain in right knee: Secondary | ICD-10-CM | POA: Diagnosis not present

## 2016-10-27 DIAGNOSIS — Z79899 Other long term (current) drug therapy: Secondary | ICD-10-CM | POA: Diagnosis not present

## 2016-10-27 DIAGNOSIS — Z86010 Personal history of colonic polyps: Secondary | ICD-10-CM

## 2016-10-27 DIAGNOSIS — E042 Nontoxic multinodular goiter: Secondary | ICD-10-CM | POA: Diagnosis not present

## 2016-10-27 DIAGNOSIS — L4052 Psoriatic arthritis mutilans: Secondary | ICD-10-CM

## 2016-10-27 DIAGNOSIS — E784 Other hyperlipidemia: Secondary | ICD-10-CM | POA: Diagnosis not present

## 2016-10-27 DIAGNOSIS — E7849 Other hyperlipidemia: Secondary | ICD-10-CM

## 2016-10-27 DIAGNOSIS — R0989 Other specified symptoms and signs involving the circulatory and respiratory systems: Secondary | ICD-10-CM

## 2016-10-27 LAB — COMPREHENSIVE METABOLIC PANEL
ALK PHOS: 63 U/L (ref 39–117)
ALT: 14 U/L (ref 0–35)
AST: 15 U/L (ref 0–37)
Albumin: 4.2 g/dL (ref 3.5–5.2)
BILIRUBIN TOTAL: 0.5 mg/dL (ref 0.2–1.2)
BUN: 18 mg/dL (ref 6–23)
CO2: 27 meq/L (ref 19–32)
Calcium: 9.1 mg/dL (ref 8.4–10.5)
Chloride: 101 mEq/L (ref 96–112)
Creatinine, Ser: 0.68 mg/dL (ref 0.40–1.20)
GFR: 93.12 mL/min (ref >60.00)
GLUCOSE: 84 mg/dL (ref 70–99)
POTASSIUM: 4.1 meq/L (ref 3.5–5.1)
Sodium: 137 mEq/L (ref 135–145)
TOTAL PROTEIN: 6.7 g/dL (ref 6.0–8.3)

## 2016-10-27 LAB — CBC WITH DIFFERENTIAL/PLATELET
BASOS PCT: 0.3 % (ref 0.0–3.0)
Basophils Absolute: 0 10*3/uL (ref 0.0–0.1)
EOS PCT: 2.2 % (ref 0.0–5.0)
Eosinophils Absolute: 0.1 10*3/uL (ref 0.0–0.7)
HEMATOCRIT: 37 % (ref 36.0–46.0)
HEMOGLOBIN: 12.6 g/dL (ref 12.0–15.0)
LYMPHS PCT: 34 % (ref 12.0–46.0)
Lymphs Abs: 1.9 10*3/uL (ref 0.7–4.0)
MCHC: 34.1 g/dL (ref 30.0–36.0)
MCV: 91.7 fl (ref 78.0–100.0)
MONO ABS: 0.4 10*3/uL (ref 0.1–1.0)
Monocytes Relative: 6.8 % (ref 3.0–12.0)
Neutro Abs: 3.1 10*3/uL (ref 1.4–7.7)
Neutrophils Relative %: 56.7 % (ref 43.0–77.0)
Platelets: 254 10*3/uL (ref 150.0–400.0)
RBC: 4.03 Mil/uL (ref 3.87–5.11)
RDW: 13.3 % (ref 11.5–15.5)
WBC: 5.5 10*3/uL (ref 4.0–10.5)

## 2016-10-27 LAB — LIPID PANEL
CHOL/HDL RATIO: 7
Cholesterol: 440 mg/dL — ABNORMAL HIGH (ref 0–200)
HDL: 60.2 mg/dL (ref >39.00)
LDL Cholesterol: 359 mg/dL — ABNORMAL HIGH (ref 0–99)
NonHDL: 380.1
Triglycerides: 106 mg/dL (ref 0.0–149.0)
VLDL: 21.2 mg/dL (ref 0.0–40.0)

## 2016-10-27 LAB — C-REACTIVE PROTEIN: CRP: 0.1 mg/dL — ABNORMAL LOW (ref 0.5–20.0)

## 2016-10-27 MED ORDER — OXYCODONE-ACETAMINOPHEN 5-325 MG PO TABS: 1.0000 | ORAL_TABLET | ORAL | 0 refills | Status: DC | PRN

## 2016-10-27 MED ORDER — CELECOXIB 200 MG PO CAPS: 200.0000 mg | ORAL_CAPSULE | Freq: Every day | ORAL | 3 refills | Status: DC

## 2016-10-27 NOTE — Progress Notes (Signed)
Pre-visit discussion using our clinic review tool. No additional management support is needed unless otherwise documented below in the visit note.  

## 2016-10-27 NOTE — Patient Instructions (Addendum)
I have refilled your celebrex  For daily use  I have refilled your percocet for prn use    Carotid dopplers have been ordered to evaluate for blockages

## 2016-10-27 NOTE — Progress Notes (Signed)
Patient ID: Madeline Young, female    DOB: 09-Aug-1956  Age: 61 y.o. MRN: [id number]  The patient is here for  management of  chronic and acute problems.  Last seen January 07 2016 for same  mammogram Oct 2017 PAP smear March 2017, atrophy, HPV negative  Colonoscopy July 2016: adenomas ,  Due 2021 cataract surgery  2016 stonecypher (found during residence in [place], missed by Patty Visiion)    The risk factors are reflected in the social history.  The roster of all physicians providing medical care to patient - is listed in the Snapshot section of the chart.  Home safety : The patient has smoke detectors in the home. They wear seatbelts.  There are no firearms at home. There is no violence in the home.   There is no risks for hepatitis, STDs or HIV. There is no   history of blood transfusion. They have no travel history to infectious disease endemic areas of the world.  The patient has seen their dentist in the last six month. They have seen their eye doctor in the last year.   They do not  have excessive sun exposure. Discussed the need for sun protection: hats, long sleeves and use of sunscreen if there is significant sun exposure.   Diet: the importance of a healthy diet is discussed. They do have a healthy diet.  The benefits of regular aerobic exercise were discussed. She walks 4 times per week ,  20 minutes.   Depression screen: there are no signs or vegative symptoms of depression- irritability, change in appetite, anhedonia, sadness/tearfullness.  The following portions of the patient's history were reviewed and updated as appropriate: allergies, current medications, past family history, past medical history,  past surgical history, past social history  and problem list.  Visual acuity was not assessed per patient preference since she has regular follow up with her ophthalmologist. Hearing and body mass index were assessed and reviewed.   During the course of the visit the patient  was educated and counseled about appropriate screening and preventive services including : fall prevention , diabetes screening, nutrition counseling, colorectal cancer screening, and recommended immunizations.    CC: The primary encounter diagnosis was Familial hyperlipidemia. 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  Chronic back pain: history of  lumbar laminectomy and foraminotomy,  May 2017 , by Arnoldo Morale.  Right hip and leg pain post operatively 4 months, plain films suggested hardware migration despite previously normal films, was seen by Neurosurgery and old that the hardware was fine,  But her ones were not fusing.  Bone stimulator ordered and MRI ordered. Hip pain is improving  With use of stimulator. Planning to resume yoga. Still has right leg pain and low back pain but better   multiple thyroid nodules,  followed by [facility],  Last  ultrasound July 2017. Stable in size . Thyroid  function normal.   Severe Erosive OA involving both hands:  Diagnosed by  [facility]ul[facility] Fanny, saw Dermatology for rash which was biopsied and diagnosed as psoriasis. Since  [facility] refused to diagnose  psoriatic arthritis . Got a 2nd opinion from [place], which led to her being "released" immediately by [facility].   History Karle has a past medical history of Adenoma (10/06/2008); ADHD (attention deficit hyperactivity disorder); Allergy; Anxiety;  Arthritis; Cataract; Complication of anesthesia; Depression; GERD (gastroesophageal reflux disease); Globus sensation; Hyperlipidemia; Insomnia; Multinodular goiter (nontoxic); Psoriatic arthritis ([place]); Spinal stenosis of lumbar region (June 2016); and Statin intolerance (01/27/2013).   She has a past surgical history  that includes Foot surgery (2006); Colonoscopy w/ polypectomy; Upper gastrointestinal endoscopy (2005); Tonsillectomy; Biopsy thyroid (2014); Eye surgery; and Back surgery (02/17/2016).   Her family history includes Coronary artery disease in her father; Heart disease in her mother.She reports that she quit smoking about 45 years ago. Her smoking use included Cigarettes. She has never used smokeless tobacco. She reports that she drinks about 0.6 oz of alcohol per week . She reports that she does not use drugs.  Outpatient Medications Prior to Visit  Medication Sig Dispense Refill  . aspirin 81 MG tablet Take 81 mg by mouth daily.      . cholecalciferol (VITAMIN D) 1000 UNITS tablet Take 1,000 Units by mouth daily.      . cyclobenzaprine (FLEXERIL) 10 MG tablet Take 1 tablet (10 mg total) by mouth 3 (three) times daily as needed. 50 tablet 1  . diclofenac sodium (VOLTAREN) 1 % GEL Apply topically 4 (four) times daily.    Marland Kitchen docusate sodium (COLACE) 100 MG capsule Take 1 capsule (100 mg total) by mouth 2 (two) times daily. 60 capsule 0  . FLUoxetine (PROZAC) 40 MG capsule Take 40 mg by mouth daily.     . Lactobacillus-Inulin (CULTURELLE DIGESTIVE HEALTH PO) Take 1 tablet by mouth daily.     . Omega-3 Fatty Acids (FISH OIL) 1200 MG CAPS Take 1,200 mg by mouth daily.     Vladimir Faster Glycol-Propyl Glycol (SYSTANE ULTRA) 0.4-0.3 % SOLN Place 1 drop into both eyes daily as needed (for dry eyes).    Marland Kitchen zolpidem (AMBIEN) 10 MG tablet Take 5 mg by mouth at bedtime.     . celecoxib (CELEBREX) 200 MG capsule TAKE ONE CAPSULE BY MOUTH TWICE DAILY *OFFICE VIST NEEDED FOR MORE REFILLS* 60 capsule 2  . oxyCODONE-acetaminophen (PERCOCET/ROXICET) 5-325 MG tablet Take 1-2 tablets by mouth every 4 (four) hours as needed for moderate pain. 100 tablet 0  . VYVANSE 30 MG capsule Take 30 mg by mouth every morning.  0   No facility-administered medications prior to visit.     Review of Systems   Patient denies  headache, fevers, malaise, unintentional weight loss, skin rash, eye pain, sinus congestion and sinus pain, sore throat, dysphagia,  hemoptysis , cough, dyspnea, wheezing, chest pain, palpitations, orthopnea, edema, abdominal pain, nausea, melena, diarrhea, constipation, flank pain, dysuria, hematuria, urinary  Frequency, nocturia, numbness, tingling, seizures,  Focal weakness, Loss of consciousness,  Tremor, insomnia, depression, anxiety, and suicidal ideation.      Objective:   BP 136/82   Pulse 79   Temp 98.5 F (36.9 C) (Oral)   Resp 17   Ht 5\' 6"  (1.676 m)   Wt 178 lb 4 oz (80.9 kg)   SpO2 97%   BMI 28.77 kg/m   Physical Exam   General appearance: alert, cooperative and appears stated age Ears: normal TM's and external ear canals both ears Throat: lips, mucosa, and tongue normal; teeth and gums normal Neck: no adenopathy, bilateral  Bruits, R>L, supple, symmetrical, trachea midline and thyroid not enlarged, symmetric, no tenderness/mass/nodules Back: symmetric, no curvature. ROM normal. No CVA tenderness. Lungs: clear to auscultation bilaterally Heart: regular rate and rhythm, S1, S2 normal, no murmur, click, rub or gallop Abdomen: soft, non-tender; bowel sounds normal; no masses,  no organomegaly Pulses: 2+ and symmetric Skin: Skin color, texture, turgor normal. No rashes or lesions MSK: bilateral Heberden's nodes, bilateral 1st CMC joint collapse  Lymph nodes: Cervical, supraclavicular, and axillary nodes normal.    Assessment & Plan:   Problem List Items Addressed This Visit    ADD (attention deficit disorder)    Managed with vyvanse, prescribed by Dr. Clovis Pu      Bilateral carotid bruits    She has familial hyperlipidemia.  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 It is unclear if she has had this diagnosis from rheumatologist Dr. Charlestine Night .  No response to Humira.  Refilling celebrex for once daily use and Percocet for once daily use . Last rx for narcotics was #45 vicodin (5/325 strength) on dec 27 by Dr. Amil Amen        Relevant Medications   celecoxib (CELEBREX) 200 MG capsule   oxyCODONE-acetaminophen (PERCOCET/ROXICET) 5-325 MG tablet    Other Visit Diagnoses    Long-term use of high-risk medication       Relevant Orders   Comprehensive metabolic panel (Completed)   Arthralgia of right lower leg       Relevant Orders   C-reactive protein (Completed)   CBC with Differential/Platelet (Completed)      I have changed Ms. Rinck's celecoxib. I am also having her maintain her zolpidem, aspirin, cholecalciferol, FLUoxetine, Fish Oil, Lactobacillus-Inulin (CULTURELLE DIGESTIVE HEALTH PO), Polyethyl Glycol-Propyl Glycol, docusate sodium, cyclobenzaprine, diclofenac sodium, VYVANSE, and oxyCODONE-acetaminophen.  Meds ordered this encounter  Medications  . VYVANSE 50 MG capsule    Sig: Take 1 tablet by mouth every morning.  . celecoxib (CELEBREX) 200 MG capsule    Sig: Take 1 capsule (200 mg total) by mouth daily.    Dispense:  90 capsule    Refill:  3  . oxyCODONE-acetaminophen (PERCOCET/ROXICET) 5-325 MG tablet    Sig: Take 1-2 tablets by mouth every 4 (four) hours as needed for moderate pain.    Dispense:  30 tablet    Refill:  0   A total of 40 minutes was spent with patient more than half of which was spent in counseling patient on the above mentioned issues , reviewing and explaining recent labs and imaging studies done, and coordination of care. Medications Discontinued During This Encounter  Medication Reason  . VYVANSE  30 MG capsule Change in therapy  . celecoxib (CELEBREX) 200 MG capsule Reorder  . oxyCODONE-acetaminophen (PERCOCET/ROXICET) 5-325 MG tablet Reorder    Follow-up: No Follow-up on file.   Crecencio Mc, MD

## 2016-10-28 LAB — T3: T3, Total: 104 ng/dL (ref 76–181)

## 2016-10-28 LAB — T4 AND TSH
T4 TOTAL: 4.9 ug/dL (ref 4.5–12.0)
TSH: 1.62 u[IU]/mL (ref 0.450–4.500)

## 2016-10-29 DIAGNOSIS — F988 Other specified behavioral and emotional disorders with onset usually occurring in childhood and adolescence: Secondary | ICD-10-CM | POA: Insufficient documentation

## 2016-10-29 DIAGNOSIS — I6523 Occlusion and stenosis of bilateral carotid arteries: Secondary | ICD-10-CM | POA: Insufficient documentation

## 2016-10-29 NOTE — Assessment & Plan Note (Addendum)
She is in constant pain due to severe arthritis of hands. It is unclear if she has had this diagnosis from rheumatologist Dr. Charlestine Night .  No response to Humira.  Refilling celebrex for once daily use and Percocet for once daily use . Last rx for narcotics was #45 vicodin (5/325 strength) on dec 27 by Dr. Amil Amen

## 2016-10-29 NOTE — Assessment & Plan Note (Signed)
She has familial hyperlipidemia.  Carotid dopplers ordered

## 2016-10-29 NOTE — Assessment & Plan Note (Signed)
Managed with vyvanse, prescribed by Dr. Clovis Pu

## 2016-10-29 NOTE — Assessment & Plan Note (Signed)
She is due in 2021 for next colonoscopy

## 2016-10-30 ENCOUNTER — Telehealth: Payer: Self-pay | Admitting: *Deleted

## 2016-10-30 NOTE — Telephone Encounter (Signed)
Void this message, was helped by referrals

## 2016-10-31 ENCOUNTER — Encounter: Payer: Self-pay | Admitting: Internal Medicine

## 2016-11-03 ENCOUNTER — Encounter (INDEPENDENT_AMBULATORY_CARE_PROVIDER_SITE_OTHER): Payer: Medicare Other | Admitting: Vascular Surgery

## 2016-11-06 ENCOUNTER — Encounter: Payer: Self-pay | Admitting: Internal Medicine

## 2016-11-06 DIAGNOSIS — R0989 Other specified symptoms and signs involving the circulatory and respiratory systems: Secondary | ICD-10-CM

## 2016-11-13 ENCOUNTER — Ambulatory Visit (INDEPENDENT_AMBULATORY_CARE_PROVIDER_SITE_OTHER): Payer: 59

## 2016-11-13 ENCOUNTER — Ambulatory Visit (INDEPENDENT_AMBULATORY_CARE_PROVIDER_SITE_OTHER): Payer: 59 | Admitting: Podiatry

## 2016-11-13 ENCOUNTER — Ambulatory Visit: Payer: Medicare Other

## 2016-11-13 VITALS — Resp 16

## 2016-11-13 DIAGNOSIS — Q828 Other specified congenital malformations of skin: Secondary | ICD-10-CM | POA: Diagnosis not present

## 2016-11-13 DIAGNOSIS — M7751 Other enthesopathy of right foot: Secondary | ICD-10-CM

## 2016-11-13 DIAGNOSIS — M778 Other enthesopathies, not elsewhere classified: Secondary | ICD-10-CM

## 2016-11-13 DIAGNOSIS — M779 Enthesopathy, unspecified: Principal | ICD-10-CM

## 2016-11-13 NOTE — Progress Notes (Signed)
   Subjective:    Patient ID: Madeline Young, female    DOB: 07-05-1956, 61 y.o.   MRN: [id number]  HPI: She presents today chief complaint of a painful callus plantar aspect of the third metatarsal phalangeal joint area of the right foot. States this been there for the past month or 2 but she is concerned that the surgery from 10 years ago may have resulted in this.    Review of Systems  Musculoskeletal: Positive for arthralgias and myalgias.  All other systems reviewed and are negative.      Objective:   Physical Exam:. Vital signs are stable alert and oriented 3 pulses are palpable. She still has severe osteoarthritic changes which no one has ever proven that she has rheumatoid that she appears to have rheumatoid in her hands and in her feet. Neurologic sensorium is intact deep tendon reflexes are intact muscle strength is intact digits demonstrate mild medial dislocation with dorsiflexion and pain on end range of motion of the metatarsophalangeal joint of the right foot. Radiographs confirm osteoarthritis of the second and third metatarsophalangeal joints both retaining screws to the heads of the metatarsals in question. No open lesions or wounds are noted. Patient does have a very large reactive hyperkeratotic lesion sub-third metatarsal head of the left foot.        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  Plan: Injected the area today around the joint with Kenalog and local anesthetic I also debrided the area area of reactive hyperkeratosis and placed her in padding. We need to consider an orthotic.

## 2016-11-14 ENCOUNTER — Ambulatory Visit (HOSPITAL_COMMUNITY)
Admission: RE | Admit: 2016-11-14 | Discharge: 2016-11-14 | Disposition: A | Discharge status: Home / Self Care | Payer: 59 | Source: Ambulatory Visit | Attending: Surgery | Admitting: Surgery

## 2016-11-14 DIAGNOSIS — R0989 Other specified symptoms and signs involving the circulatory and respiratory systems: Secondary | ICD-10-CM | POA: Diagnosis not present

## 2016-11-14 DIAGNOSIS — I6523 Occlusion and stenosis of bilateral carotid arteries: Secondary | ICD-10-CM | POA: Insufficient documentation

## 2016-11-14 LAB — VAS US CAROTID
LCCADDIAS: 17 cm/s
LCCADSYS: 64 cm/s
LEFT ECA DIAS: -8 cm/s
LEFT VERTEBRAL DIAS: 13 cm/s
LICAPDIAS: -17 cm/s
Left CCA prox dias: 23 cm/s
Left CCA prox sys: 128 cm/s
Left ICA prox sys: -61 cm/s
RCCAPSYS: 116 cm/s
RIGHT CCA MID DIAS: 19 cm/s
RIGHT ECA DIAS: -7 cm/s
RIGHT VERTEBRAL DIAS: -16 cm/s
Right CCA prox dias: 17 cm/s
Right cca dist sys: -99 cm/s

## 2016-11-16 ENCOUNTER — Encounter: Payer: Self-pay | Admitting: Internal Medicine

## 2016-11-20 ENCOUNTER — Telehealth: Payer: Self-pay | Admitting: *Deleted

## 2016-11-20 ENCOUNTER — Encounter: Payer: Self-pay | Admitting: Internal Medicine

## 2016-11-20 NOTE — Telephone Encounter (Signed)
Left detailed mess informing pt of below.  

## 2016-11-20 NOTE — Telephone Encounter (Signed)
Please advise on 11/14/16 Carotid u/s.  LOV: 10/27/16 Next OV: 01/11/17.

## 2016-11-20 NOTE — Telephone Encounter (Signed)
TEL HER TO CHECK HER MY CHART ACCOUNT.  MESSAGE SENT Saturday

## 2016-11-20 NOTE — Telephone Encounter (Signed)
Pt requested results from her vascular referral  Pt contact [phone]

## 2016-11-21 DIAGNOSIS — Z6829 Body mass index (BMI) 29.0-29.9, adult: Secondary | ICD-10-CM | POA: Diagnosis not present

## 2016-11-21 DIAGNOSIS — R030 Elevated blood-pressure reading, without diagnosis of hypertension: Secondary | ICD-10-CM | POA: Diagnosis not present

## 2016-11-21 DIAGNOSIS — M4316 Spondylolisthesis, lumbar region: Secondary | ICD-10-CM | POA: Diagnosis not present

## 2016-11-27 DIAGNOSIS — F321 Major depressive disorder, single episode, moderate: Secondary | ICD-10-CM | POA: Diagnosis not present

## 2016-12-19 ENCOUNTER — Encounter: Payer: Self-pay | Admitting: Internal Medicine

## 2016-12-20 ENCOUNTER — Telehealth: Payer: Self-pay | Admitting: Internal Medicine

## 2016-12-20 MED ORDER — OSELTAMIVIR PHOSPHATE 75 MG PO CAPS: 75.0000 mg | ORAL_CAPSULE | Freq: Every day | ORAL | 0 refills | Status: DC

## 2016-12-20 NOTE — Telephone Encounter (Signed)
Pt called and stated that her grandson has tested positive for the flu and is in ICU in [place]. Pt is going to have to fly to [place] to help out tomorrow.. Pt would like to have Dr. Derrel Nip call in an rx for tamiflu. Please advise, thank you! Patient isn't having any flu symptoms.     Call pt @ [phone]  Pharmacy - [facility] [place], [place]

## 2016-12-20 NOTE — Telephone Encounter (Signed)
Pt called back returning your call. Advised pt that medication was sent to the pharmacy.

## 2016-12-20 NOTE — Telephone Encounter (Signed)
Pt called and stated that her grandson has tested positive for the flu and is in ICU in [place]. Pt is going to have to fly to [place] to help out tomorrow.. Pt would like to have Dr. Derrel Nip call in an rx for tamiflu. Please advise, thank you!  Call pt @ [phone]  Pharmacy - [facility] [place], [place]

## 2016-12-20 NOTE — Telephone Encounter (Signed)
Left message to call.

## 2016-12-20 NOTE — Telephone Encounter (Signed)
tamiflu sent to [facility] in [place]

## 2016-12-25 ENCOUNTER — Ambulatory Visit: Payer: Medicare Other | Admitting: Podiatry

## 2017-01-11 ENCOUNTER — Ambulatory Visit (INDEPENDENT_AMBULATORY_CARE_PROVIDER_SITE_OTHER): Payer: 59 | Admitting: Internal Medicine

## 2017-01-11 ENCOUNTER — Encounter: Payer: Self-pay | Admitting: Internal Medicine

## 2017-01-11 DIAGNOSIS — M4316 Spondylolisthesis, lumbar region: Secondary | ICD-10-CM

## 2017-01-11 DIAGNOSIS — F900 Attention-deficit hyperactivity disorder, predominantly inattentive type: Secondary | ICD-10-CM | POA: Diagnosis not present

## 2017-01-11 DIAGNOSIS — Z0000 Encounter for general adult medical examination without abnormal findings: Secondary | ICD-10-CM | POA: Diagnosis not present

## 2017-01-11 DIAGNOSIS — Z789 Other specified health status: Secondary | ICD-10-CM | POA: Diagnosis not present

## 2017-01-11 MED ORDER — OXYCODONE-ACETAMINOPHEN 5-325 MG PO TABS: 1.0000 | ORAL_TABLET | ORAL | 0 refills | Status: DC | PRN

## 2017-01-11 NOTE — Progress Notes (Signed)
Pre visit review using our clinic review tool, if applicable. No additional management support is needed unless otherwise documented below in the visit note. 

## 2017-01-11 NOTE — Progress Notes (Addendum)
Patient ID: Madeline Young, female    DOB: [birth year]  Age: 61 y.o. MRN: [id number]  The patient is here for annual CPE and management of other chronic and acute problems. mammogram Oct 2017 PAP smear March 2017, atrophy, HPV negative  Colonoscopy July 2016: adenomas ,  Due 2021 cataract surgery  2016 stonecypher (found during residence in [place], missed by [facility]) .      The risk factors are reflected in the social history.  The roster of all physicians providing medical care to patient - is listed in the Snapshot section of the chart.  Activities of daily living:  The patient is 100% independent in all ADLs: dressing, toileting, feeding as well as independent mobility  Home safety : The patient has smoke detectors in the home. They wear seatbelts.  There are no firearms at home. There is no violence in the home.   There is no risks for hepatitis, STDs or HIV. There is no   history of blood transfusion. They have no travel history to infectious disease endemic areas of the world.  The patient has seen their dentist in the last six month. They have seen their eye doctor in the last year. They admit to slight hearing difficulty with regard to whispered voices and some television programs.  They have deferred audiologic testing in the last year.  They do not  have excessive sun exposure. Discussed the need for sun protection: hats, long sleeves and use of sunscreen if there is significant sun exposure.   Diet: the importance of a healthy diet is discussed. They do have a healthy diet.  The benefits of regular aerobic exercise were discussed. She walks 4 times per week ,  20 minutes.   Depression screen: there are no signs or vegative symptoms of depression- irritability, change in appetite, anhedonia, sadness/tearfullness.  Cognitive assessment: the patient manages all their financial and personal affairs and is actively engaged. They could relate day,date,year and events; recalled 2/3  objects at 3 minutes; performed clock-face test normally.  The following portions of the patient's history were reviewed and updated as appropriate: allergies, current medications, past family history, past medical history,  past surgical history, past social history  and problem list.  Visual acuity was not assessed per patient preference since she has regular follow up with her ophthalmologist. Hearing and body mass index were assessed and reviewed.   During the course of the visit the patient was educated and counseled about appropriate screening and preventive services including : fall prevention , diabetes screening, nutrition counseling, colorectal cancer screening, and recommended immunizations.    CC: Diagnoses of Visit for preventive health examination, Statin intolerance, Spondylolisthesis of lumbar region, and Attention deficit hyperactivity disorder (ADHD), predominantly inattentive type were pertinent to this visit. Back surgery on e year  Ago,  Still has some right sided sciatica   Carotid dopplers ordered in January: no change in < 40% stenosis since 2007   Just returned from [place] visiting daughter, Her 43 yr grandson with seizure disorder was hospitalized with flu and respiratory failure requiring ventilator and chest tube. .  Now has RSV   Discussed lipids.  Father had 4 CAD in his 47's and mother died in earlu 52's from hear t disease    History Madeline Young has a past medical history of Adenoma (10/06/2008); ADHD (attention deficit hyperactivity disorder); Allergy; Anxiety; Arthritis; Cataract; Complication of anesthesia; Depression; GERD (gastroesophageal reflux disease); Globus sensation; Hyperlipidemia; Insomnia; Multinodular goiter (nontoxic); Psoriatic arthritis ([place]); Spinal  stenosis of lumbar region (June 2016); and Statin intolerance (01/27/2013).   She has a past surgical history that includes Foot surgery (2006); Colonoscopy w/ polypectomy; Upper gastrointestinal endoscopy  (2005); Tonsillectomy; Biopsy thyroid (2014); Eye surgery; and Back surgery (02/17/2016).   Her family history includes Coronary artery disease in her father; Heart disease in her mother.She reports that she quit smoking about 45 years ago. Her smoking use included Cigarettes. She has never used smokeless tobacco. She reports that she drinks about 0.6 oz of alcohol per week . She reports that she does not use drugs.  Outpatient Medications Prior to Visit  Medication Sig Dispense Refill  . aspirin 81 MG tablet Take 81 mg by mouth daily.      . celecoxib (CELEBREX) 200 MG capsule Take 1 capsule (200 mg total) by mouth daily. 90 capsule 3  . cholecalciferol (VITAMIN D) 1000 UNITS tablet Take 1,000 Units by mouth daily.      . diclofenac sodium (VOLTAREN) 1 % GEL Apply topically 4 (four) times daily.    Marland Kitchen docusate sodium (COLACE) 100 MG capsule Take 1 capsule (100 mg total) by mouth 2 (two) times daily. 60 capsule 0  . FLUoxetine (PROZAC) 40 MG capsule Take 40 mg by mouth daily.     . Lactobacillus-Inulin (CULTURELLE DIGESTIVE HEALTH PO) Take 1 tablet by mouth daily.     . Omega-3 Fatty Acids (FISH OIL) 1200 MG CAPS Take 1,200 mg by mouth daily.     Vladimir Faster Glycol-Propyl Glycol (SYSTANE ULTRA) 0.4-0.3 % SOLN Place 1 drop into both eyes daily as needed (for dry eyes).    Marland Kitchen VYVANSE 50 MG capsule Take 1 tablet by mouth every morning.    . zolpidem (AMBIEN) 10 MG tablet Take 5 mg by mouth at bedtime.     Marland Kitchen oxyCODONE-acetaminophen (PERCOCET/ROXICET) 5-325 MG tablet Take 1-2 tablets by mouth every 4 (four) hours as needed for moderate pain. 30 tablet 0  . oseltamivir (TAMIFLU) 75 MG capsule Take 1 capsule (75 mg total) by mouth daily. (Patient not taking: Reported on 01/11/2017) 10 capsule 0   No facility-administered medications prior to visit.     Review of Systems   Patient denies headache, fevers, malaise, unintentional weight loss, skin rash, eye pain, sinus congestion and sinus pain, sore  throat, dysphagia,  hemoptysis , cough, dyspnea, wheezing, chest pain, palpitations, orthopnea, edema, abdominal pain, nausea, melena, diarrhea, constipation, flank pain, dysuria, hematuria, urinary  Frequency, nocturia, numbness, tingling, seizures,  Focal weakness, Loss of consciousness,  Tremor, insomnia, depression, anxiety, and suicidal ideation.      Objective:  BP 112/66   Pulse 84   Temp 98.6 F (37 C) (Oral)   Resp 16   Ht 5\' 6"  (1.676 m)   Wt 170 lb 12.8 oz (77.5 kg)   SpO2 98%   BMI 27.57 kg/m   Physical Exam   General appearance: alert, cooperative and appears stated age Head: Normocephalic, without obvious abnormality, atraumatic Eyes: conjunctivae/corneas clear. PERRL, EOM's intact. Fundi benign. Ears: normal TM's and external ear canals both ears Nose: Nares normal. Septum midline. Mucosa normal. No drainage or sinus tenderness. Throat: lips, mucosa, and tongue normal; teeth and gums normal Neck: no adenopathy, no carotid bruit, no JVD, supple, symmetrical, trachea midline and thyroid not enlarged, symmetric, no tenderness/mass/nodules Lungs: clear to auscultation bilaterally Breasts: normal appearance, no masses or tenderness Heart: regular rate and rhythm, S1, S2 normal, no murmur, click, rub or gallop Abdomen: soft, non-tender; bowel sounds normal; no masses,  no organomegaly Extremities: extremities normal, atraumatic, no cyanosis or edema Pulses: 2+ and symmetric Skin: Skin color, texture, turgor normal. No rashes or lesions Neurologic: Alert and oriented X 3, normal strength and tone. Normal symmetric reflexes. Normal coordination and gait.    Assessment & Plan:   Problem List Items Addressed This Visit    ADD (attention deficit disorder)    Managed by DR Clovis Pu with vyvanse       Spondylolisthesis of lumbar region    With recurrent low back pain .  She is requesting refill on narcotics for prn use .  I have given her a 30 tablet supply . Refill history  confirmed via [place] Controlled Substance databas, accessed by me today..she has had no narcotics sent in by there provider since late December      Statin intolerance    Discussed trial of Repatha given her family history of early cAD and elevated baseline       Visit for preventive health examination    Annual comprehensive preventive exam was done as well as an evaluation and management of chronic conditions .  During the course of the visit the patient was educated and counseled about appropriate screening and preventive services including :  diabetes screening, lipid analysis with projected  10 year  risk for CAD , nutrition counseling, breast, cervical and colorectal cancer screening, and recommended immunizations.  Printed recommendations for health maintenance screenings was given         I have discontinued Ms. Christon's oseltamivir. I am also having her maintain her zolpidem, aspirin, cholecalciferol, FLUoxetine, Fish Oil, Lactobacillus-Inulin (CULTURELLE DIGESTIVE HEALTH PO), Polyethyl Glycol-Propyl Glycol, docusate sodium, diclofenac sodium, VYVANSE, celecoxib, and oxyCODONE-acetaminophen.  Meds ordered this encounter  Medications  . DISCONTD: oxyCODONE-acetaminophen (PERCOCET/ROXICET) 5-325 MG tablet    Sig: Take 1-2 tablets by mouth every 4 (four) hours as needed for moderate pain.    Dispense:  30 tablet    Refill:  0  . DISCONTD: oxyCODONE-acetaminophen (PERCOCET/ROXICET) 5-325 MG tablet    Sig: Take 1-2 tablets by mouth every 4 (four) hours as needed for moderate pain.    Dispense:  30 tablet    Refill:  0    May refill on or after Feb 10 2017  . oxyCODONE-acetaminophen (PERCOCET/ROXICET) 5-325 MG tablet    Sig: Take 1-2 tablets by mouth every 4 (four) hours as needed for moderate pain.    Dispense:  30 tablet    Refill:  0    May refill on or after March 13 2017    Medications Discontinued During This Encounter  Medication Reason  . oseltamivir (TAMIFLU) 75 MG capsule  Therapy completed  . oxyCODONE-acetaminophen (PERCOCET/ROXICET) 5-325 MG tablet Reorder  . oxyCODONE-acetaminophen (PERCOCET/ROXICET) 5-325 MG tablet Reorder  . oxyCODONE-acetaminophen (PERCOCET/ROXICET) 5-325 MG tablet Reorder    Follow-up: Return in about 3 months (around 04/12/2017).   Crecencio Mc, MD

## 2017-01-11 NOTE — Patient Instructions (Signed)
Health Maintenance for Postmenopausal Women Menopause is a normal process in which your reproductive ability comes to an end. This process happens gradually over a span of months to years, usually between the ages of 33 and 38. Menopause is complete when you have missed 12 consecutive menstrual periods. It is important to talk with your health care provider about some of the most common conditions that affect postmenopausal women, such as heart disease, cancer, and bone loss (osteoporosis). Adopting a healthy lifestyle and getting preventive care can help to promote your health and wellness. Those actions can also lower your chances of developing some of these common conditions. What should I know about menopause? During menopause, you may experience a number of symptoms, such as:  Moderate-to-severe hot flashes.  Night sweats.  Decrease in sex drive.  Mood swings.  Headaches.  Tiredness.  Irritability.  Memory problems.  Insomnia. Choosing to treat or not to treat menopausal changes is an individual decision that you make with your health care provider. What should I know about hormone replacement therapy and supplements? Hormone therapy products are effective for treating symptoms that are associated with menopause, such as hot flashes and night sweats. Hormone replacement carries certain risks, especially as you become older. If you are thinking about using estrogen or estrogen with progestin treatments, discuss the benefits and risks with your health care provider. What should I know about heart disease and stroke? Heart disease, heart attack, and stroke become more likely as you age. This may be due, in part, to the hormonal changes that your body experiences during menopause. These can affect how your body processes dietary fats, triglycerides, and cholesterol. Heart attack and stroke are both medical emergencies. There are many things that you can do to help prevent heart disease  and stroke:  Have your blood pressure checked at least every 1-2 years. High blood pressure causes heart disease and increases the risk of stroke.  If you are 48-61 years old, ask your health care provider if you should take aspirin to prevent a heart attack or a stroke.  Do not use any tobacco products, including cigarettes, chewing tobacco, or electronic cigarettes. If you need help quitting, ask your health care provider.  It is important to eat a healthy diet and maintain a healthy weight.  Be sure to include plenty of vegetables, fruits, low-fat dairy products, and lean protein.  Avoid eating foods that are high in solid fats, added sugars, or salt (sodium).  Get regular exercise. This is one of the most important things that you can do for your health.  Try to exercise for at least 150 minutes each week. The type of exercise that you do should increase your heart rate and make you sweat. This is known as moderate-intensity exercise.  Try to do strengthening exercises at least twice each week. Do these in addition to the moderate-intensity exercise.  Know your numbers.Ask your health care provider to check your cholesterol and your blood glucose. Continue to have your blood tested as directed by your health care provider. What should I know about cancer screening? There are several types of cancer. Take the following steps to reduce your risk and to catch any cancer development as early as possible. Breast Cancer  Practice breast self-awareness.  This means understanding how your breasts normally appear and feel.  It also means doing regular breast self-exams. Let your health care provider know about any changes, no matter how small.  If you are 40 or older,  have a clinician do a breast exam (clinical breast exam or CBE) every year. Depending on your age, family history, and medical history, it may be recommended that you also have a yearly breast X-ray (mammogram).  If you  have a family history of breast cancer, talk with your health care provider about genetic screening.  If you are at high risk for breast cancer, talk with your health care provider about having an MRI and a mammogram every year.  Breast cancer (BRCA) gene test is recommended for women who have family members with BRCA-related cancers. Results of the assessment will determine the need for genetic counseling and BRCA1 and for BRCA2 testing. BRCA-related cancers include these types:  Breast. This occurs in males or females.  Ovarian.  Tubal. This may also be called fallopian tube cancer.  Cancer of the abdominal or pelvic lining (peritoneal cancer).  Prostate.  Pancreatic. Cervical, Uterine, and Ovarian Cancer  Your health care provider may recommend that you be screened regularly for cancer of the pelvic organs. These include your ovaries, uterus, and vagina. This screening involves a pelvic exam, which includes checking for microscopic changes to the surface of your cervix (Pap test).  For women ages 21-65, health care providers may recommend a pelvic exam and a Pap test every three years. For women ages 23-65, they may recommend the Pap test and pelvic exam, combined with testing for human papilloma virus (HPV), every five years. Some types of HPV increase your risk of cervical cancer. Testing for HPV may also be done on women of any age who have unclear Pap test results.  Other health care providers may not recommend any screening for nonpregnant women who are considered low risk for pelvic cancer and have no symptoms. Ask your health care provider if a screening pelvic exam is right for you.  If you have had past treatment for cervical cancer or a condition that could lead to cancer, you need Pap tests and screening for cancer for at least 20 years after your treatment. If Pap tests have been discontinued for you, your risk factors (such as having a new sexual partner) need to be reassessed  to determine if you should start having screenings again. Some women have medical problems that increase the chance of getting cervical cancer. In these cases, your health care provider may recommend that you have screening and Pap tests more often.  If you have a family history of uterine cancer or ovarian cancer, talk with your health care provider about genetic screening.  If you have vaginal bleeding after reaching menopause, tell your health care provider.  There are currently no reliable tests available to screen for ovarian cancer. Lung Cancer  Lung cancer screening is recommended for adults 99-83 years old who are at high risk for lung cancer because of a history of smoking. A yearly low-dose CT scan of the lungs is recommended if you:  Currently smoke.  Have a history of at least 30 pack-years of smoking and you currently smoke or have quit within the past 15 years. A pack-year is smoking an average of one pack of cigarettes per day for one year. Yearly screening should:  Continue until it has been 15 years since you quit.  Stop if you develop a health problem that would prevent you from having lung cancer treatment. Colorectal Cancer  This type of cancer can be detected and can often be prevented.  Routine colorectal cancer screening usually begins at age 72 and continues  through age 75.  If you have risk factors for colon cancer, your health care provider may recommend that you be screened at an earlier age.  If you have a family history of colorectal cancer, talk with your health care provider about genetic screening.  Your health care provider may also recommend using home test kits to check for hidden blood in your stool.  A small camera at the end of a tube can be used to examine your colon directly (sigmoidoscopy or colonoscopy). This is done to check for the earliest forms of colorectal cancer.  Direct examination of the colon should be repeated every 5-10 years until  age 75. However, if early forms of precancerous polyps or small growths are found or if you have a family history or genetic risk for colorectal cancer, you may need to be screened more often. Skin Cancer  Check your skin from head to toe regularly.  Monitor any moles. Be sure to tell your health care provider:  About any new moles or changes in moles, especially if there is a change in a mole's shape or color.  If you have a mole that is larger than the size of a pencil eraser.  If any of your family members has a history of skin cancer, especially at a young age, talk with your health care provider about genetic screening.  Always use sunscreen. Apply sunscreen liberally and repeatedly throughout the day.  Whenever you are outside, protect yourself by wearing long sleeves, pants, a wide-brimmed hat, and sunglasses. What should I know about osteoporosis? Osteoporosis is a condition in which bone destruction happens more quickly than new bone creation. After menopause, you may be at an increased risk for osteoporosis. To help prevent osteoporosis or the bone fractures that can happen because of osteoporosis, the following is recommended:  If you are 19-50 years old, get at least 1,000 mg of calcium and at least 600 mg of vitamin D per day.  If you are older than age 50 but younger than age 70, get at least 1,200 mg of calcium and at least 600 mg of vitamin D per day.  If you are older than age 70, get at least 1,200 mg of calcium and at least 800 mg of vitamin D per day. Smoking and excessive alcohol intake increase the risk of osteoporosis. Eat foods that are rich in calcium and vitamin D, and do weight-bearing exercises several times each week as directed by your health care provider. What should I know about how menopause affects my mental health? Depression may occur at any age, but it is more common as you become older. Common symptoms of depression include:  Low or sad  mood.  Changes in sleep patterns.  Changes in appetite or eating patterns.  Feeling an overall lack of motivation or enjoyment of activities that you previously enjoyed.  Frequent crying spells. Talk with your health care provider if you think that you are experiencing depression. What should I know about immunizations? It is important that you get and maintain your immunizations. These include:  Tetanus, diphtheria, and pertussis (Tdap) booster vaccine.  Influenza every year before the flu season begins.  Pneumonia vaccine.  Shingles vaccine. Your health care provider may also recommend other immunizations. This information is not intended to replace advice given to you by your health care provider. Make sure you discuss any questions you have with your health care provider. Document Released: 11/17/2005 Document Revised: 04/14/2016 Document Reviewed: 06/29/2015 Elsevier Interactive Patient   Education  2017 [employer]  

## 2017-01-13 NOTE — Assessment & Plan Note (Addendum)
With recurrent low back pain .  She is requesting refill on narcotics for prn use .  I have given her a 30 tablet supply . Refill history confirmed via [place] Controlled Substance databas, accessed by me today..she has had no narcotics sent in by there provider since late December

## 2017-01-13 NOTE — Assessment & Plan Note (Signed)
Managed by DR Clovis Pu with vyvanse

## 2017-01-13 NOTE — Assessment & Plan Note (Signed)
Annual comprehensive preventive exam was done as well as an evaluation and management of chronic conditions .  During the course of the visit the patient was educated and counseled about appropriate screening and preventive services including :  diabetes screening, lipid analysis with projected  10 year  risk for CAD , nutrition counseling, breast, cervical and colorectal cancer screening, and recommended immunizations.  Printed recommendations for health maintenance screenings was given 

## 2017-01-13 NOTE — Assessment & Plan Note (Signed)
Discussed trial of Repatha given her family history of early cAD and elevated baseline

## 2017-01-15 ENCOUNTER — Encounter: Payer: Self-pay | Admitting: Podiatry

## 2017-01-15 ENCOUNTER — Ambulatory Visit (INDEPENDENT_AMBULATORY_CARE_PROVIDER_SITE_OTHER): Payer: 59 | Admitting: Podiatry

## 2017-01-15 DIAGNOSIS — D3610 Benign neoplasm of peripheral nerves and autonomic nervous system, unspecified: Secondary | ICD-10-CM

## 2017-01-15 DIAGNOSIS — Q828 Other specified congenital malformations of skin: Secondary | ICD-10-CM

## 2017-01-15 NOTE — Progress Notes (Signed)
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 She's also complaining of a painful callus  Objective:: Vital signs are stable she is alert and oriented 3. Pulses are palpable. Neurologic extremities intact. Hammertoe deformity of her fourth digit right foot resulting in plantarflexed metatarsal and reactive hyperkeratosis without skin breakdown. She also has pain on palpation to the third interdigital space of the right foot.  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  Plan: I injected dehydrated alcohol her first dose third digit due to space right foot. I also debrided reactive hyperkeratosis for her.

## 2017-01-16 MED ORDER — EVOLOCUMAB 140 MG/ML ~~LOC~~ SOAJ: 140.0000 mg | SUBCUTANEOUS | 2 refills | Status: DC

## 2017-01-16 NOTE — Addendum Note (Signed)
Addended by: Nanci Pina on: 01/16/2017 02:25 PM   Modules accepted: Orders

## 2017-01-18 ENCOUNTER — Telehealth: Payer: Self-pay | Admitting: Internal Medicine

## 2017-01-18 NOTE — Telephone Encounter (Signed)
pateint has been notified Repatha Prior authorization is in process. Notified patient she could receive call from [place] and that may require some paperwork on her behalf , patient verbally agreed.

## 2017-01-23 ENCOUNTER — Telehealth: Payer: Self-pay

## 2017-01-23 NOTE — Telephone Encounter (Signed)
Received a fax from [employer] stating that they are unable to approve the Repatha rx because she does not meet any of the criteria. Placed the paperwork in your red folder incase you wanted to look over it.

## 2017-02-05 ENCOUNTER — Encounter (INDEPENDENT_AMBULATORY_CARE_PROVIDER_SITE_OTHER): Payer: 59 | Admitting: Podiatry

## 2017-02-05 NOTE — Progress Notes (Signed)
This encounter was created in error - please disregard.

## 2017-03-13 DIAGNOSIS — R030 Elevated blood-pressure reading, without diagnosis of hypertension: Secondary | ICD-10-CM | POA: Diagnosis not present

## 2017-03-13 DIAGNOSIS — Z6828 Body mass index (BMI) 28.0-28.9, adult: Secondary | ICD-10-CM | POA: Diagnosis not present

## 2017-03-13 DIAGNOSIS — M545 Low back pain: Secondary | ICD-10-CM | POA: Diagnosis not present

## 2017-03-14 ENCOUNTER — Telehealth: Payer: Self-pay | Admitting: Internal Medicine

## 2017-03-14 NOTE — Telephone Encounter (Signed)
FYI: Appeal process started on repatha started With letter faxed on 02/06/17 called insurance to see where the appeal stands no answer left message to return call to office today. Next step will to  File an assistance request with Repatha if insurance denies. THis is just an [place].

## 2017-03-15 ENCOUNTER — Other Ambulatory Visit: Payer: Self-pay | Admitting: Neurosurgery

## 2017-03-15 DIAGNOSIS — G8929 Other chronic pain: Secondary | ICD-10-CM

## 2017-03-15 DIAGNOSIS — M545 Low back pain: Principal | ICD-10-CM

## 2017-03-27 DIAGNOSIS — L4050 Arthropathic psoriasis, unspecified: Secondary | ICD-10-CM | POA: Diagnosis not present

## 2017-03-28 DIAGNOSIS — M546 Pain in thoracic spine: Secondary | ICD-10-CM | POA: Insufficient documentation

## 2017-04-05 ENCOUNTER — Other Ambulatory Visit: Payer: Self-pay | Admitting: Neurosurgery

## 2017-04-05 DIAGNOSIS — M545 Low back pain: Principal | ICD-10-CM

## 2017-04-05 DIAGNOSIS — G8929 Other chronic pain: Secondary | ICD-10-CM

## 2017-04-09 DIAGNOSIS — M5136 Other intervertebral disc degeneration, lumbar region: Secondary | ICD-10-CM | POA: Diagnosis not present

## 2017-04-09 DIAGNOSIS — L4050 Arthropathic psoriasis, unspecified: Secondary | ICD-10-CM | POA: Diagnosis not present

## 2017-04-09 NOTE — Telephone Encounter (Signed)
Called and left second message dfor patient insurance appeals department for a return call, have not received return call to date.

## 2017-04-12 ENCOUNTER — Encounter: Payer: Self-pay | Admitting: Internal Medicine

## 2017-04-12 ENCOUNTER — Ambulatory Visit (INDEPENDENT_AMBULATORY_CARE_PROVIDER_SITE_OTHER): Payer: 59 | Admitting: Internal Medicine

## 2017-04-12 VITALS — BP 122/80 | HR 75 | Temp 98.2°F | Resp 15 | Ht 66.0 in | Wt 174.6 lb

## 2017-04-12 DIAGNOSIS — M4316 Spondylolisthesis, lumbar region: Secondary | ICD-10-CM

## 2017-04-12 DIAGNOSIS — L4052 Psoriatic arthritis mutilans: Secondary | ICD-10-CM

## 2017-04-12 DIAGNOSIS — Z79899 Other long term (current) drug therapy: Secondary | ICD-10-CM | POA: Diagnosis not present

## 2017-04-12 LAB — HEPATIC FUNCTION PANEL
ALT: 16 U/L (ref 0–35)
AST: 16 U/L (ref 0–37)
Albumin: 4.2 g/dL (ref 3.5–5.2)
Alkaline Phosphatase: 67 U/L (ref 39–117)
BILIRUBIN DIRECT: 0 mg/dL (ref 0.0–0.3)
BILIRUBIN TOTAL: 0.5 mg/dL (ref 0.2–1.2)
Total Protein: 6.9 g/dL (ref 6.0–8.3)

## 2017-04-12 MED ORDER — OXYCODONE-ACETAMINOPHEN 5-325 MG PO TABS: 1.0000 | ORAL_TABLET | Freq: Every day | ORAL | 0 refills | Status: DC | PRN

## 2017-04-12 MED ORDER — ONDANSETRON 4 MG PO TBDP: 4.0000 mg | ORAL_TABLET | Freq: Three times a day (TID) | ORAL | 0 refills | Status: DC | PRN

## 2017-04-12 MED ORDER — CELECOXIB 200 MG PO CAPS: 200.0000 mg | ORAL_CAPSULE | Freq: Two times a day (BID) | ORAL | 3 refills | Status: DC

## 2017-04-12 NOTE — Progress Notes (Signed)
Subjective:  Patient ID: Tija FALLYN MUNNERLYN, female    DOB: [birth year]  Age: 61 y.o. MRN: [id number]  CC: The primary encounter diagnosis was Long-term use of high-risk medication. 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  HPI Kerra D Savino presents for 3 month follow up  on low back chronic pain .  Patient was given a 90 day supply of oxycodone in April after revieweing the [place] controlled substance database for prescriptions by other providers.  She has a history of an L4-5 fusion in 2011 and is still seeing neurosurgery for sciatica involving right leg.  She Is scheduled for a myelogram  In August .  Also has increased  OA pain involving both hands.    Rheum:  Newman Nickels with [facility].  Workup  Was repeated  For psoriatic arthritis and oter forms of erosive arthritis..  Advocated  Initiation of remicaide infusion after proving mtx failure.  Started weekly MTX July 3rd, dose was 10 mg .  So far having nausea, dizziness and presyncopal,/fatiuge   Heat intolerance. States that her hands actually feel worse. Still taking celebrex once daily and using the voltaren gel prn flares ,.   Occasional additional use of advil "because it helps"  Ices her back daily   No results found for: HGBA1C   Outpatient Medications Prior to Visit  Medication Sig Dispense Refill  . aspirin 81 MG tablet Take 81 mg by mouth daily.      . cholecalciferol (VITAMIN D) 1000 UNITS tablet Take 1,000 Units by mouth daily.      . diclofenac sodium (VOLTAREN) 1 % GEL Apply topically 4 (four) times daily.    Marland Kitchen docusate sodium (COLACE) 100 MG capsule Take 1 capsule (100 mg total) by mouth 2 (two) times daily. 60 capsule 0  . FLUoxetine (PROZAC) 40 MG capsule Take 40 mg by mouth daily.     . Lactobacillus-Inulin (CULTURELLE DIGESTIVE HEALTH PO) Take 1 tablet by mouth daily.     . Omega-3 Fatty Acids (FISH OIL) 1200 MG  CAPS Take 1,200 mg by mouth daily.     Vladimir Faster Glycol-Propyl Glycol (SYSTANE ULTRA) 0.4-0.3 % SOLN Place 1 drop into both eyes daily as needed (for dry eyes).    Marland Kitchen VYVANSE 50 MG capsule Take 1 tablet by mouth every morning.    . zolpidem (AMBIEN) 10 MG tablet Take 5 mg by mouth at bedtime.     . celecoxib (CELEBREX) 200 MG capsule Take 1 capsule (200 mg total) by mouth daily. 90 capsule 3  . oxyCODONE-acetaminophen (PERCOCET/ROXICET) 5-325 MG tablet Take 1-2 tablets by mouth every 4 (four) hours as needed for moderate pain. 30 tablet 0  . Evolocumab (REPATHA SURECLICK) 665 MG/ML SOAJ Inject 140 mg into the skin every 14 (fourteen) days. (Patient not taking: Reported on 03/15/2017) 2 mL 2   No facility-administered medications prior to visit.     Review of Systems;  Patient denies headache, fevers, malaise, unintentional weight loss, skin rash, eye pain, sinus congestion and sinus pain, sore throat, dysphagia,  hemoptysis , cough, dyspnea, wheezing, chest pain, palpitations, orthopnea, edema, abdominal pain, nausea, melena, diarrhea, constipation, flank pain, dysuria, hematuria, urinary  Frequency, nocturia, numbness, tingling, seizures,  Focal weakness, Loss of consciousness,  Tremor, insomnia, depression, anxiety, and suicidal ideation.      Objective:  BP 122/80 (BP Location: Left Arm, Patient Position: Sitting, Cuff Size: Normal)   Pulse 75  Temp 98.2 F (36.8 C) (Oral)   Resp 15   Ht 5' 6"  (1.676 m)   Wt 174 lb 9.6 oz (79.2 kg)   SpO2 99%   BMI 28.18 kg/m   BP Readings from Last 3 Encounters:  04/12/17 122/80  01/11/17 112/66  10/27/16 136/82    Wt Readings from Last 3 Encounters:  04/12/17 174 lb 9.6 oz (79.2 kg)  01/11/17 170 lb 12.8 oz (77.5 kg)  10/27/16 178 lb 4 oz (80.9 kg)    General appearance: alert, cooperative and appears stated age Ears: normal TM's and external ear canals both ears Throat: lips, mucosa, and tongue normal; teeth and gums normal Neck: no  adenopathy, no carotid bruit, supple, symmetrical, trachea midline and thyroid not enlarged, symmetric, no tenderness/mass/nodules Back: symmetric, no curvature. ROM normal. No CVA tenderness. Lungs: clear to auscultation bilaterally Heart: regular rate and rhythm, S1, S2 normal, no murmur, click, rub or gallop Abdomen: soft, non-tender; bowel sounds normal; no masses,  no organomegaly Pulses: 2+ and symmetric MSK: [place]'s nodes affecting all fingers. Skin: Skin color, texture, turgor normal. No rashes or lesions Lymph nodes: Cervical, supraclavicular, and axillary nodes normal.  No results found for: HGBA1C  Lab Results  Component Value Date   CREATININE 0.68 10/27/2016   CREATININE 0.70 08/11/2016   CREATININE 0.58 02/18/2016    Lab Results  Component Value Date   WBC 5.5 10/27/2016   HGB 12.6 10/27/2016   HCT 37.0 10/27/2016   PLT 254.0 10/27/2016   GLUCOSE 84 10/27/2016   CHOL 440 (H) 10/27/2016   TRIG 106.0 10/27/2016   HDL 60.20 10/27/2016   LDLDIRECT 252.3 06/12/2012   LDLCALC 359 (H) 10/27/2016   ALT 16 04/12/2017   AST 16 04/12/2017   NA 137 10/27/2016   K 4.1 10/27/2016   CL 101 10/27/2016   CREATININE 0.68 10/27/2016   BUN 18 10/27/2016   CO2 27 10/27/2016   TSH 1.620 10/27/2016    No results found.  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  Methotrexate started lst week by Dr Dossie Der.        Relevant Medications   methotrexate (RHEUMATREX) 2.5 MG tablet   celecoxib (CELEBREX) 200 MG capsule   oxyCODONE-acetaminophen (PERCOCET/ROXICET) 5-325 MG tablet   Spondylolisthesis of lumbar region    With persistent low back pain not relieved with NSAIDs.   She is requesting refill on narcotics for prn use . Marland Kitchen Refill history confirmed via [place] Controlled Substance databas, accessed by me today..she has had no narcotics sent in by any other  Provider.  .refills for 90 days given.        Other Visit Diagnoses    Long-term use of high-risk medication    -  Primary   Relevant Orders   Hepatic function panel (Completed)      I have changed Ms. Kahl's celecoxib. I am also having her start on ondansetron. Additionally, I am having her maintain her zolpidem, aspirin, cholecalciferol, FLUoxetine, Fish Oil, Lactobacillus-Inulin (CULTURELLE DIGESTIVE HEALTH PO), Polyethyl Glycol-Propyl Glycol, docusate sodium, diclofenac sodium, VYVANSE, Evolocumab, folic acid, methotrexate, and oxyCODONE-acetaminophen.  Meds ordered this encounter  Medications  . folic acid (FOLVITE) 1 MG tablet    Sig: Take 1 mg by mouth daily.    Refill:  3  . methotrexate (RHEUMATREX) 2.5 MG tablet    Sig: TAKE 4 TABS AS DIRECTED ONCE A  WEEK    Refill:  2  . ondansetron (ZOFRAN ODT) 4 MG disintegrating tablet    Sig: Take 1 tablet (4 mg total) by mouth every 8 (eight) hours as needed for nausea or vomiting.    Dispense:  20 tablet    Refill:  0  . celecoxib (CELEBREX) 200 MG capsule    Sig: Take 1 capsule (200 mg total) by mouth 2 (two) times daily.    Dispense:  180 capsule    Refill:  3  . DISCONTD: oxyCODONE-acetaminophen (PERCOCET/ROXICET) 5-325 MG tablet    Sig: Take 1-2 tablets by mouth daily as needed for moderate pain.    Dispense:  30 tablet    Refill:  0    May refill on or after April 12 2017  . DISCONTD: oxyCODONE-acetaminophen (PERCOCET/ROXICET) 5-325 MG tablet    Sig: Take 1-2 tablets by mouth daily as needed for moderate pain.    Dispense:  30 tablet    Refill:  0    May refill on or after May 13 2017  . oxyCODONE-acetaminophen (PERCOCET/ROXICET) 5-325 MG tablet    Sig: Take 1-2 tablets by mouth daily as needed for moderate pain.    Dispense:  30 tablet    Refill:  0    May refill on or after Sept  5 2018   A total of 25 minutes of face to face time was spent with patient more than half of which was spent in counselling about the above  mentioned conditions  and coordination of care  Medications Discontinued During This Encounter  Medication Reason  . celecoxib (CELEBREX) 200 MG capsule Reorder  . oxyCODONE-acetaminophen (PERCOCET/ROXICET) 5-325 MG tablet Reorder  . oxyCODONE-acetaminophen (PERCOCET/ROXICET) 5-325 MG tablet Reorder  . oxyCODONE-acetaminophen (PERCOCET/ROXICET) 5-325 MG tablet Reorder    Follow-up: Return in about 3 months (around 07/13/2017) for on or before october 5 .   Crecencio Mc, MD

## 2017-04-12 NOTE — Patient Instructions (Addendum)
I have refilled your narcotics for 3 more months .  Please remember that you will need to see me every 3 months to receive refills.  Do not accept or request narcotics prescriptions from any  other providers without contacting me  Do not mix with alcohol or sleeping medications ,  As the risk of overdose increases AND can result in death from decreased respiratory drive

## 2017-04-13 ENCOUNTER — Encounter: Payer: Self-pay | Admitting: Internal Medicine

## 2017-04-14 NOTE — Assessment & Plan Note (Signed)
With persistent low back pain not relieved with NSAIDs.   She is requesting refill on narcotics for prn use . Marland Kitchen Refill history confirmed via [place] Controlled Substance databas, accessed by me today..she has had no narcotics sent in by any other  Provider. .refills for 90 days given.

## 2017-04-14 NOTE — Assessment & Plan Note (Signed)
Suspected by rheumatology and patient.  Methotrexate started lst week by Dr Dossie Der.

## 2017-04-18 DIAGNOSIS — F321 Major depressive disorder, single episode, moderate: Secondary | ICD-10-CM | POA: Diagnosis not present

## 2017-04-24 ENCOUNTER — Telehealth: Payer: Self-pay | Admitting: Internal Medicine

## 2017-04-24 NOTE — Telephone Encounter (Signed)
Left message to return call to office.

## 2017-04-24 NOTE — Telephone Encounter (Signed)
Received appeal approval for Repatha , patient is approved until 10/17/2017. Which begins new insurance year.

## 2017-04-24 NOTE — Telephone Encounter (Signed)
Thank you Juliann Pulse!  Do I need to send the rx again to pharmacy?

## 2017-04-25 NOTE — Telephone Encounter (Signed)
Please schedule an RN visit for injection instruction

## 2017-04-25 NOTE — Telephone Encounter (Signed)
Notified patient of the next steps in the process of treating patient with repatha, sent copy of approval letter to long pharmacy awaiting to know cost of medication patient co- pay. Should not need new script at this time.  If Co -pay is high patient  can apply for assistance through [employer] and I can assist. I will need ok to bring patient into office to teach injection when she receives the medication.

## 2017-04-27 NOTE — Telephone Encounter (Signed)
Patient called to advised nurse that her Co-pay for repatha was to much for her on fixed income , advised patient of repatha patient assist program . Patient will pick up paper work today.

## 2017-04-27 NOTE — Telephone Encounter (Signed)
Please call pt [phone]

## 2017-04-27 NOTE — Telephone Encounter (Signed)
Mailed unread message to patient.  

## 2017-05-03 NOTE — Telephone Encounter (Signed)
[employer] paperwork return ed by patient completed by nurse and faxed to [employer].

## 2017-06-28 ENCOUNTER — Telehealth: Payer: Self-pay | Admitting: Internal Medicine

## 2017-06-28 DIAGNOSIS — E785 Hyperlipidemia, unspecified: Secondary | ICD-10-CM

## 2017-06-28 DIAGNOSIS — Z79899 Other long term (current) drug therapy: Secondary | ICD-10-CM

## 2017-06-28 NOTE — Telephone Encounter (Signed)
Forgot to add opt 2 after call the number provided

## 2017-06-28 NOTE — Telephone Encounter (Signed)
Christina from [employer] called and is needing clarification on pt's Evolocumab (REPATHA SURECLICK) 643 MG/ML SOAJ. Please advise, thank you!  Call [phone]

## 2017-06-28 NOTE — Telephone Encounter (Signed)
Pt states that the Rx has two things are incorrect no despense on Rx . Fax [phone] corrected rx to [place] ok for Rx to say 90 days. They need to know 1 yr or 6 month. Please advise? Pt name and case number [id number] on fax.  Call pt @ [phone]

## 2017-07-02 MED ORDER — EVOLOCUMAB 140 MG/ML ~~LOC~~ SOAJ: 140.0000 mg | SUBCUTANEOUS | 3 refills | Status: DC

## 2017-07-02 NOTE — Telephone Encounter (Signed)
Medication rx changed to 90 day supply with refills.  Needs fasting labs and cmet ,  ordered asap  Lab Results  Component Value Date   CHOL 440 (H) 10/27/2016   HDL 60.20 10/27/2016   LDLCALC 359 (H) 10/27/2016   LDLDIRECT 252.3 06/12/2012   TRIG 106.0 10/27/2016   CHOLHDL 7 10/27/2016

## 2017-07-02 NOTE — Telephone Encounter (Signed)
Rx printed, signed and faxed.   LMTCB. Need to schedule pt a lab appt. Labs have been ordered.

## 2017-07-02 NOTE — Telephone Encounter (Signed)
OK to fill repatha for 90 day supply? And 1 refill?

## 2017-07-04 DIAGNOSIS — E042 Nontoxic multinodular goiter: Secondary | ICD-10-CM | POA: Diagnosis not present

## 2017-07-05 NOTE — Telephone Encounter (Signed)
Patient aware.

## 2017-07-05 NOTE — Telephone Encounter (Signed)
Pt called and was questioning as to why she needed lab work as she has not received the medication and has not started it. Please advise, thank you!  Call pt @ [phone]

## 2017-07-05 NOTE — Telephone Encounter (Signed)
Patient is just starting receive the Repatha does need lab work yet she should eceive first dose this week.

## 2017-07-05 NOTE — Telephone Encounter (Signed)
Needs labs in 3 weeks after starting meds

## 2017-07-11 ENCOUNTER — Telehealth: Payer: Self-pay | Admitting: Internal Medicine

## 2017-07-11 NOTE — Telephone Encounter (Signed)
Repatha teaching on Friday.

## 2017-07-11 NOTE — Telephone Encounter (Signed)
Pt called and stated that she received her Evolocumab (REPATHA SURECLICK) 863 MG/ML SOAJ medication today and that she has an appt on Friday with Dr. Derrel Nip that she is going to bring it in with her. If this is a problem call her and let her know. Please advise, thank you!  Call pt @ [phone]

## 2017-07-13 ENCOUNTER — Ambulatory Visit (INDEPENDENT_AMBULATORY_CARE_PROVIDER_SITE_OTHER): Payer: 59 | Admitting: Internal Medicine

## 2017-07-13 ENCOUNTER — Encounter: Payer: Self-pay | Admitting: Internal Medicine

## 2017-07-13 VITALS — BP 124/58 | HR 76 | Temp 98.7°F | Resp 15 | Ht 66.0 in | Wt 173.6 lb

## 2017-07-13 DIAGNOSIS — E559 Vitamin D deficiency, unspecified: Secondary | ICD-10-CM | POA: Diagnosis not present

## 2017-07-13 DIAGNOSIS — M4316 Spondylolisthesis, lumbar region: Secondary | ICD-10-CM | POA: Diagnosis not present

## 2017-07-13 DIAGNOSIS — I6523 Occlusion and stenosis of bilateral carotid arteries: Secondary | ICD-10-CM

## 2017-07-13 DIAGNOSIS — L4052 Psoriatic arthritis mutilans: Secondary | ICD-10-CM

## 2017-07-13 DIAGNOSIS — E7849 Other hyperlipidemia: Secondary | ICD-10-CM | POA: Diagnosis not present

## 2017-07-13 MED ORDER — OXYCODONE-ACETAMINOPHEN 5-325 MG PO TABS: 1.0000 | ORAL_TABLET | Freq: Every day | ORAL | 0 refills | Status: DC | PRN

## 2017-07-13 NOTE — Progress Notes (Signed)
Subjective:  Patient ID: Madeline Young, female    DOB: 30-Aug-1956  Age: 61 y.o. MRN: [id number]  CC: The primary encounter diagnosis was Vitamin D deficiency. 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  HPI Madeline Young presents for FOLLOW UP ON HYPERLIPIDEMIA,  chronic back pain with prior fusion and ongoing sciatica, and inflammatory arthritis managed by [facility] rheumatology  Joint pain and back pain currently more tolerated with use of oxycodone.  She is using one tablet daily .  She also takes Vyvanse,  prescribed by her psychiatrist, Dr Clovis Pu.  Refill history confirmed via [place] Controlled Substance database, accessed by me today..   Starting repatha today for hyperlipidemia   With untreated LDL > 250 due tot statin intolerance.  Right sided paraspinus and right leg pain ,  initally right leg,  Now left leg as well .  Worse at night   Using oxycodone one daily Having Myelogram next Wednesday,  Has concerns about post procedure positioning.  Reviewed the recommendations  (lie flat for 24 hours vs 30 degree and  for 24 hours )  .      Lab Results  Component Value Date   CHOL 440 (H) 10/27/2016   HDL 60.20 10/27/2016   LDLCALC 359 (H) 10/27/2016   LDLDIRECT 252.3 06/12/2012   TRIG 106.0 10/27/2016   CHOLHDL 7 10/27/2016     Outpatient Medications Prior to Visit  Medication Sig Dispense Refill  . aspirin 81 MG tablet Take 81 mg by mouth daily.      . celecoxib (CELEBREX) 200 MG capsule Take 1 capsule (200 mg total) by mouth 2 (two) times daily. 180 capsule 3  . cholecalciferol (VITAMIN D) 1000 UNITS tablet Take 1,000 Units by mouth daily.      . diclofenac sodium (VOLTAREN) 1 % GEL Apply topically 4 (four) times daily.    Marland Kitchen docusate sodium (COLACE) 100 MG capsule Take 1 capsule (100 mg total) by mouth 2 (two)  times daily. 60 capsule 0  . Evolocumab (REPATHA SURECLICK) 011 MG/ML SOAJ Inject 140 mg into the skin every 14 (fourteen) days. 6 mL 3  . FLUoxetine (PROZAC) 40 MG capsule Take 40 mg by mouth daily.     . folic acid (FOLVITE) 1 MG tablet Take 1 mg by mouth daily.  3  . Lactobacillus-Inulin (CULTURELLE DIGESTIVE HEALTH PO) Take 1 tablet by mouth daily.     . Omega-3 Fatty Acids (FISH OIL) 1200 MG CAPS Take 1,200 mg by mouth daily.     Vladimir Faster Glycol-Propyl Glycol (SYSTANE ULTRA) 0.4-0.3 % SOLN Place 1 drop into both eyes daily as needed (for dry eyes).    Marland Kitchen VYVANSE 50 MG capsule Take 1 tablet by mouth every morning.    . zolpidem (AMBIEN) 10 MG tablet Take 5 mg by mouth at bedtime.     Marland Kitchen oxyCODONE-acetaminophen (PERCOCET/ROXICET) 5-325 MG tablet Take 1-2 tablets by mouth daily as needed for moderate pain. 30 tablet 0  . methotrexate (RHEUMATREX) 2.5 MG tablet TAKE 4 TABS AS DIRECTED ONCE A WEEK  2  . ondansetron (ZOFRAN ODT) 4 MG disintegrating tablet Take 1 tablet (4 mg total) by mouth every 8 (eight) hours as needed for nausea or vomiting. (Patient not taking: Reported on 07/13/2017) 20 tablet 0   No facility-administered medications prior to visit.     Review of Systems;  Patient denies headache, fevers,  malaise, unintentional weight loss, skin rash, eye pain, sinus congestion and sinus pain, sore throat, dysphagia,  hemoptysis , cough, dyspnea, wheezing, chest pain, palpitations, orthopnea, edema, abdominal pain, nausea, melena, diarrhea, constipation, flank pain, dysuria, hematuria, urinary  Frequency, nocturia, numbness, tingling, seizures,  Focal weakness, Loss of consciousness,  Tremor, insomnia, depression, anxiety, and suicidal ideation.      Objective:  BP (!) 124/58 (BP Location: Left Arm, Patient Position: Sitting, Cuff Size: Normal)   Pulse 76   Temp 98.7 F (37.1 C) (Oral)   Resp 15   Ht 5' 6"  (1.676 m)   Wt 173 lb 9.6 oz (78.7 kg)   SpO2 98%   BMI 28.02 kg/m   BP  Readings from Last 3 Encounters:  07/13/17 (!) 124/58  04/12/17 122/80  01/11/17 112/66    Wt Readings from Last 3 Encounters:  07/13/17 173 lb 9.6 oz (78.7 kg)  04/12/17 174 lb 9.6 oz (79.2 kg)  01/11/17 170 lb 12.8 oz (77.5 kg)    General appearance: alert, cooperative and appears stated age Ears: normal TM's and external ear canals both ears Throat: lips, mucosa, and tongue normal; teeth and gums normal Neck: no adenopathy, no carotid bruit, supple, symmetrical, trachea midline and thyroid not enlarged, symmetric, no tenderness/mass/nodules Back: symmetric, no curvature. ROM normal. No CVA tenderness. Lungs: clear to auscultation bilaterally Heart: regular rate and rhythm, S1, S2 normal, no murmur, click, rub or gallop Abdomen: soft, non-tender; bowel sounds normal; no masses,  no organomegaly Pulses: 2+ and symmetric Skin: Skin color, texture, turgor normal. No rashes or lesions Lymph nodes: Cervical, supraclavicular, and axillary nodes normal.  No results found for: HGBA1C  Lab Results  Component Value Date   CREATININE 0.68 10/27/2016   CREATININE 0.70 08/11/2016   CREATININE 0.58 02/18/2016    Lab Results  Component Value Date   WBC 5.5 10/27/2016   HGB 12.6 10/27/2016   HCT 37.0 10/27/2016   PLT 254.0 10/27/2016   GLUCOSE 84 10/27/2016   CHOL 440 (H) 10/27/2016   TRIG 106.0 10/27/2016   HDL 60.20 10/27/2016   LDLDIRECT 252.3 06/12/2012   LDLCALC 359 (H) 10/27/2016   ALT 16 04/12/2017   AST 16 04/12/2017   NA 137 10/27/2016   K 4.1 10/27/2016   CL 101 10/27/2016   CREATININE 0.68 10/27/2016   BUN 18 10/27/2016   CO2 27 10/27/2016   TSH 1.620 10/27/2016    No results found.  Assessment & Plan:   Problem List Items Addressed This Visit    Carotid artery stenosis, asymptomatic, bilateral    Less than 40  % by dopplers done Feb 2018.  No change since 2007      Familial hyperlipidemia    She has an untreated LDL of > 250 , bilateral carotid bruits,   Inflammatory arthritisi and is Intolerant to other statins. Starting [place] today.  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 Has had Methotrexate started lst week by Dr Dossie Der.        Relevant Medications   oxyCODONE-acetaminophen (PERCOCET/ROXICET) 5-325 MG tablet   Spondylolisthesis of lumbar region    With persistent low back pain not relieved with NSAIDs.   She is requesting refill on narcotics for prn use . Marland Kitchen Refill history confirmed via [place] Controlled Substance database, accessed by me today..she has had no narcotics sent in by any other  Provider. .refills for 90 days given.  Will add gabapentin at bedtime 300 mg        Other Visit Diagnoses    Vitamin D deficiency    -  Primary   Relevant Orders   VITAMIN D 25 Hydroxy (Vit-D Deficiency, Fractures)     A total of 25 minutes of face to face time was spent with patient more than half of which was spent in counselling about the above mentioned conditions  and coordination of care   I have discontinued Ms. Bolinger's methotrexate and ondansetron. I am also having her maintain her zolpidem, aspirin, cholecalciferol, FLUoxetine, Fish Oil, Lactobacillus-Inulin (CULTURELLE DIGESTIVE HEALTH PO), Polyethyl Glycol-Propyl Glycol, docusate sodium, diclofenac sodium, VYVANSE, folic acid, celecoxib, Evolocumab, and oxyCODONE-acetaminophen.  Meds ordered this encounter  Medications  . DISCONTD: oxyCODONE-acetaminophen (PERCOCET/ROXICET) 5-325 MG tablet    Sig: Take 1-2 tablets by mouth daily as needed for moderate pain.    Dispense:  31 tablet    Refill:  0    May refill on or after July 13 2017  . DISCONTD: oxyCODONE-acetaminophen (PERCOCET/ROXICET) 5-325 MG tablet    Sig: Take 1-2 tablets by mouth daily as needed for moderate pain.    Dispense:  30 tablet    Refill:  0    May refill on or after August 13 2017  . oxyCODONE-acetaminophen (PERCOCET/ROXICET) 5-325  MG tablet    Sig: Take 1-2 tablets by mouth daily as needed for moderate pain.    Dispense:  31 tablet    Refill:  0    May refill on or after September 12 2017    Medications Discontinued During This Encounter  Medication Reason  . methotrexate (RHEUMATREX) 2.5 MG tablet Patient has not taken in last 30 days  . ondansetron (ZOFRAN ODT) 4 MG disintegrating tablet Patient has not taken in last 30 days  . oxyCODONE-acetaminophen (PERCOCET/ROXICET) 5-325 MG tablet Reorder  . oxyCODONE-acetaminophen (PERCOCET/ROXICET) 5-325 MG tablet Reorder  . oxyCODONE-acetaminophen (PERCOCET/ROXICET) 5-325 MG tablet Reorder    Follow-up: Return for fasting labs 2-3 weesk .   Crecencio Mc, MD

## 2017-07-13 NOTE — Patient Instructions (Addendum)
try adding 300 mg gabapentin at bedtime    Myelogram, Care After These instructions give you information about caring for yourself after your procedure. Your doctor may also give you more specific instructions. Call your doctor if you have any problems or questions after your procedure. Follow these instructions at home:  Drink enough fluid to keep your pee (urine) clear or pale yellow.  Rest as told by your doctor.  Lie flat with your head slightly raised (elevated).  Do not bend, lift, or do any hard activities for 24-48 hours or as told by your doctor.  Take over-the-counter and prescription medicines only as told by your doctor.  Take care of and remove your bandage (dressing) as told by your doctor.  Bathe or shower as told by your doctor. Contact a health care provider if:  You have a fever.  You have a headache that lasts longer than 24 hours.  You feel sick to your stomach (nauseous).  You throw up (vomit).  Your neck is stiff.  Your legs feel numb.  You cannot pee.  You cannot poop (have a bowel movement).  You have a rash.  You are itchy or sneezing. Get help right away if:  You have new symptoms or your symptoms get worse.  You have a seizure.  You have trouble breathing. This information is not intended to replace advice given to you by your health care provider. Make sure you discuss any questions you have with your health care provider. Document Released: 07/04/2008 Document Revised: 05/25/2016 Document Reviewed: 07/08/2015 Elsevier Interactive Patient Education  [employer].

## 2017-07-15 NOTE — Assessment & Plan Note (Signed)
She has an untreated LDL of > 250 , bilateral carotid bruits,  Inflammatory arthritisi and is Intolerant to other statins. Starting [place] today.  Repeat lipids in4 weeks

## 2017-07-15 NOTE — Assessment & Plan Note (Addendum)
With persistent low back pain not relieved with NSAIDs.   She is requesting refill on narcotics for prn use . Marland Kitchen Refill history confirmed via [place] Controlled Substance database, accessed by me today..she has had no narcotics sent in by any other  Provider. .refills for 90 days given.   Will add gabapentin at bedtime 300 mg

## 2017-07-15 NOTE — Assessment & Plan Note (Signed)
Suspected by rheumatology and patient. Has had Methotrexate started lst week by Dr Dossie Der.

## 2017-07-15 NOTE — Assessment & Plan Note (Signed)
Less than 40  % by dopplers done Feb 2018.  No change since 2007

## 2017-07-18 ENCOUNTER — Ambulatory Visit
Admission: RE | Admit: 2017-07-18 | Discharge: 2017-07-18 | Disposition: A | Discharge status: Home / Self Care | Payer: 59 | Source: Ambulatory Visit | Attending: Neurosurgery | Admitting: Neurosurgery

## 2017-07-18 VITALS — BP 138/58 | HR 55

## 2017-07-18 DIAGNOSIS — M5021 Other cervical disc displacement,  high cervical region: Secondary | ICD-10-CM | POA: Diagnosis not present

## 2017-07-18 DIAGNOSIS — M545 Low back pain: Secondary | ICD-10-CM

## 2017-07-18 DIAGNOSIS — M4316 Spondylolisthesis, lumbar region: Secondary | ICD-10-CM

## 2017-07-18 DIAGNOSIS — M5126 Other intervertebral disc displacement, lumbar region: Secondary | ICD-10-CM | POA: Diagnosis not present

## 2017-07-18 DIAGNOSIS — G8929 Other chronic pain: Secondary | ICD-10-CM

## 2017-07-18 MED ORDER — DIAZEPAM 5 MG PO TABS
10.0000 mg | ORAL_TABLET | Freq: Once | ORAL | Status: AC
  Administered 2017-07-18: 10 mg via ORAL

## 2017-07-18 MED ORDER — IOPAMIDOL (ISOVUE-M 300) INJECTION 61%
9.0000 mL | Freq: Once | INTRAMUSCULAR | Status: AC | PRN
  Administered 2017-07-18: 9 mL via INTRATHECAL

## 2017-07-18 NOTE — Progress Notes (Signed)
Patient states she has been off Prozac and Vyvanse for at least the past two days.  Brita Romp, RN

## 2017-07-18 NOTE — Discharge Instructions (Signed)
Myelogram Discharge Instructions  1. Go home and rest quietly for the next 24 hours.  It is important to lie flat for the next 24 hours.  Get up only to go to the restroom.  You may lie in the bed or on a couch on your back, your stomach, your left side or your right side.  You may have one pillow under your head.  You may have pillows between your knees while you are on your side or under your knees while you are on your back.  2. DO NOT drive today.  Recline the seat as far back as it will go, while still wearing your seat belt, on the way home.  3. You may get up to go to the bathroom as needed.  You may sit up for 10 minutes to eat.  You may resume your normal diet and medications unless otherwise indicated.  Drink plenty of extra fluids today and tomorrow.  4. The incidence of a spinal headache with nausea and/or vomiting is about 5% (one in 20 patients).  If you develop a headache, lie flat and drink plenty of fluids until the headache goes away.  Caffeinated beverages may be helpful.  If you develop severe nausea and vomiting or a headache that does not go away with flat bed rest, call [phone].  5. You may resume normal activities after your 24 hours of bed rest is over; however, do not exert yourself strongly or do any heavy lifting tomorrow.  6. Call your physician for a follow-up appointment.    You may resume Prozac and Vyvanse on Thursday, July 19, 2017 after 10:30a.m.

## 2017-07-23 ENCOUNTER — Other Ambulatory Visit: Payer: Self-pay | Admitting: Internal Medicine

## 2017-07-23 DIAGNOSIS — Z1231 Encounter for screening mammogram for malignant neoplasm of breast: Secondary | ICD-10-CM

## 2017-07-25 DIAGNOSIS — E042 Nontoxic multinodular goiter: Secondary | ICD-10-CM | POA: Diagnosis not present

## 2017-07-26 DIAGNOSIS — M47812 Spondylosis without myelopathy or radiculopathy, cervical region: Secondary | ICD-10-CM | POA: Diagnosis not present

## 2017-07-26 DIAGNOSIS — Z6828 Body mass index (BMI) 28.0-28.9, adult: Secondary | ICD-10-CM | POA: Diagnosis not present

## 2017-07-26 DIAGNOSIS — S32009K Unspecified fracture of unspecified lumbar vertebra, subsequent encounter for fracture with nonunion: Secondary | ICD-10-CM | POA: Insufficient documentation

## 2017-07-26 DIAGNOSIS — M545 Low back pain: Secondary | ICD-10-CM | POA: Diagnosis not present

## 2017-07-26 DIAGNOSIS — R030 Elevated blood-pressure reading, without diagnosis of hypertension: Secondary | ICD-10-CM | POA: Diagnosis not present

## 2017-07-26 DIAGNOSIS — M542 Cervicalgia: Secondary | ICD-10-CM | POA: Diagnosis not present

## 2017-08-10 ENCOUNTER — Other Ambulatory Visit: Payer: 59

## 2017-08-17 ENCOUNTER — Ambulatory Visit
Admission: RE | Admit: 2017-08-17 | Discharge: 2017-08-17 | Disposition: A | Discharge status: Home / Self Care | Payer: 59 | Source: Ambulatory Visit | Attending: Internal Medicine | Admitting: Internal Medicine

## 2017-08-17 DIAGNOSIS — Z1231 Encounter for screening mammogram for malignant neoplasm of breast: Secondary | ICD-10-CM

## 2017-08-21 DIAGNOSIS — F321 Major depressive disorder, single episode, moderate: Secondary | ICD-10-CM | POA: Diagnosis not present

## 2017-09-05 ENCOUNTER — Other Ambulatory Visit: Payer: 59

## 2017-09-11 ENCOUNTER — Other Ambulatory Visit (INDEPENDENT_AMBULATORY_CARE_PROVIDER_SITE_OTHER): Payer: Medicare HMO

## 2017-09-11 DIAGNOSIS — E785 Hyperlipidemia, unspecified: Secondary | ICD-10-CM | POA: Diagnosis not present

## 2017-09-11 DIAGNOSIS — E559 Vitamin D deficiency, unspecified: Secondary | ICD-10-CM | POA: Diagnosis not present

## 2017-09-11 DIAGNOSIS — Z79899 Other long term (current) drug therapy: Secondary | ICD-10-CM | POA: Diagnosis not present

## 2017-09-11 LAB — LIPID PANEL
CHOL/HDL RATIO: 5
Cholesterol: 373 mg/dL — ABNORMAL HIGH (ref 0–200)
HDL: 68.2 mg/dL (ref >39.00)
LDL CALC: 289 mg/dL — AB (ref 0–99)
NONHDL: 305.19
Triglycerides: 83 mg/dL (ref 0.0–149.0)
VLDL: 16.6 mg/dL (ref 0.0–40.0)

## 2017-09-11 LAB — COMPREHENSIVE METABOLIC PANEL
ALK PHOS: 57 U/L (ref 39–117)
ALT: 16 U/L (ref 0–35)
AST: 18 U/L (ref 0–37)
Albumin: 4.5 g/dL (ref 3.5–5.2)
BILIRUBIN TOTAL: 0.7 mg/dL (ref 0.2–1.2)
BUN: 22 mg/dL (ref 6–23)
CO2: 32 meq/L (ref 19–32)
CREATININE: 0.65 mg/dL (ref 0.40–1.20)
Calcium: 9.1 mg/dL (ref 8.4–10.5)
Chloride: 100 mEq/L (ref 96–112)
GFR: 97.82 mL/min (ref >60.00)
GLUCOSE: 106 mg/dL — AB (ref 70–99)
Potassium: 4 mEq/L (ref 3.5–5.1)
Sodium: 137 mEq/L (ref 135–145)
TOTAL PROTEIN: 6.7 g/dL (ref 6.0–8.3)

## 2017-09-11 LAB — VITAMIN D 25 HYDROXY (VIT D DEFICIENCY, FRACTURES): VITD: 33.35 ng/mL (ref 30.00–100.00)

## 2017-09-12 ENCOUNTER — Encounter: Payer: Self-pay | Admitting: Internal Medicine

## 2017-09-14 DIAGNOSIS — R69 Illness, unspecified: Secondary | ICD-10-CM | POA: Diagnosis not present

## 2017-10-22 ENCOUNTER — Encounter: Payer: Self-pay | Admitting: Internal Medicine

## 2017-10-22 ENCOUNTER — Ambulatory Visit (INDEPENDENT_AMBULATORY_CARE_PROVIDER_SITE_OTHER): Payer: Medicare HMO | Admitting: Internal Medicine

## 2017-10-22 VITALS — BP 122/68 | HR 81 | Temp 98.2°F | Resp 15 | Ht 66.0 in | Wt 177.0 lb

## 2017-10-22 DIAGNOSIS — M4316 Spondylolisthesis, lumbar region: Secondary | ICD-10-CM

## 2017-10-22 DIAGNOSIS — E7849 Other hyperlipidemia: Secondary | ICD-10-CM | POA: Diagnosis not present

## 2017-10-22 MED ORDER — GABAPENTIN 100 MG PO CAPS: 100.0000 mg | ORAL_CAPSULE | Freq: Three times a day (TID) | ORAL | 3 refills | Status: DC

## 2017-10-22 MED ORDER — OXYCODONE-ACETAMINOPHEN 5-325 MG PO TABS: 1.0000 | ORAL_TABLET | Freq: Every day | ORAL | 0 refills | Status: DC | PRN

## 2017-10-22 NOTE — Progress Notes (Signed)
Subjective:  Patient ID: Madeline Young, female    DOB: 1956-05-09  Age: 62 y.o. MRN: [id number]  CC: The primary encounter diagnosis was Familial hyperlipidemia. A diagnosis of Spondylolisthesis of lumbar region was also pertinent to this visit.  HPI Madeline Young presents for follow up  On hyperlippidemia with statin intolerance  managed until recently with Repatha  Started in early October  4 , had 3 doses (evey 2 weeks ) .  Tolerated first 2 doses,  But on the 3rd time she developed body aches,  Lethargy,  Chills without fever,  And mental status changes "my brain did not seem connected to my body."  Symptoms lasted 3 or 4 days  .  She Started reading on the Internet about reactions to [place] mostly from  blogs from people. Has also becoe concerned about weight gain despite "exercising" regularly   (her exercise was comprised of yoga and walking,she is limited by orthopedic complaints).  She now has recurrent pain in the inferior portion of her left heel that is not present unless she is weight bearing.  has podiatry appt Wednesday  Sciatica:  She had myelogram in October  By Arnoldo Morale and was told that her L5 surgery did not result in a fusion .  She is frustrated and dissatisfied with the results and with her former neurosurgeon .   So she is at a crossroads.  Has sciatica involcing the left leg , thinks the  heel my be related .  Has tried gabapentin , made her too dizzy even at lowest dose at night , but she admits she has not taken it more than a few times so she is willing to try it again. She is using oxycodone prescribed by me 1/.2 in am plus 2 tylenol.     Lab Results  Component Value Date   CHOL 373 (H) 09/11/2017   HDL 68.20 09/11/2017   LDLCALC 289 (H) 09/11/2017   LDLDIRECT 252.3 06/12/2012   TRIG 83.0 09/11/2017   CHOLHDL 5 09/11/2017     Outpatient Medications Prior to Visit  Medication Sig Dispense Refill  . aspirin 81 MG tablet Take 81 mg by mouth daily.      .  celecoxib (CELEBREX) 200 MG capsule Take 1 capsule (200 mg total) by mouth 2 (two) times daily. 180 capsule 3  . cholecalciferol (VITAMIN D) 1000 UNITS tablet Take 1,000 Units by mouth daily.      . diclofenac sodium (VOLTAREN) 1 % GEL Apply topically 4 (four) times daily.    Marland Kitchen docusate sodium (COLACE) 100 MG capsule Take 1 capsule (100 mg total) by mouth 2 (two) times daily. 60 capsule 0  . Evolocumab (REPATHA SURECLICK) 784 MG/ML SOAJ Inject 140 mg into the skin every 14 (fourteen) days. 6 mL 3  . FLUoxetine (PROZAC) 40 MG capsule Take 40 mg by mouth daily.     . folic acid (FOLVITE) 1 MG tablet Take 1 mg by mouth daily.  3  . Lactobacillus-Inulin (CULTURELLE DIGESTIVE HEALTH PO) Take 1 tablet by mouth daily.     . Omega-3 Fatty Acids (FISH OIL) 1200 MG CAPS Take 1,200 mg by mouth daily.     Vladimir Faster Glycol-Propyl Glycol (SYSTANE ULTRA) 0.4-0.3 % SOLN Place 1 drop into both eyes daily as needed (for dry eyes).    Marland Kitchen VYVANSE 50 MG capsule Take 1 tablet by mouth every morning.    . zolpidem (AMBIEN) 10 MG tablet Take 5 mg by mouth at bedtime.     Marland Kitchen  oxyCODONE-acetaminophen (PERCOCET/ROXICET) 5-325 MG tablet Take 1-2 tablets by mouth daily as needed for moderate pain. 31 tablet 0   No facility-administered medications prior to visit.     Review of Systems;  Patient denies headache, fevers, malaise, unintentional weight loss, skin rash, eye pain, sinus congestion and sinus pain, sore throat, dysphagia,  hemoptysis , cough, dyspnea, wheezing, chest pain, palpitations, orthopnea, edema, abdominal pain, nausea, melena, diarrhea, constipation, flank pain, dysuria, hematuria, urinary  Frequency, nocturia, numbness, tingling, seizures,  Focal weakness, Loss of consciousness,  Tremor, insomnia, depression, anxiety, and suicidal ideation.      Objective:  BP 122/68 (BP Location: Left Arm, Patient Position: Sitting, Cuff Size: Normal)   Pulse 81   Temp 98.2 F (36.8 C) (Oral)   Resp 15   Ht 5\' 6"   (1.676 m)   Wt 177 lb (80.3 kg)   SpO2 96%   BMI 28.57 kg/m   BP Readings from Last 3 Encounters:  10/22/17 122/68  07/18/17 (!) 138/58  07/13/17 (!) 124/58    Wt Readings from Last 3 Encounters:  10/22/17 177 lb (80.3 kg)  07/13/17 173 lb 9.6 oz (78.7 kg)  04/12/17 174 lb 9.6 oz (79.2 kg)    General appearance: alert, cooperative and appears stated age Ears: normal TM's and external ear canals both ears Throat: lips, mucosa, and tongue normal; teeth and gums normal Neck: no adenopathy, no carotid bruit, supple, symmetrical, trachea midline and thyroid not enlarged, symmetric, no tenderness/mass/nodules Back: symmetric, no curvature. ROM normal. No CVA tenderness. Lungs: clear to auscultation bilaterally Heart: regular rate and rhythm, S1, S2 normal, no murmur, click, rub or gallop Abdomen: soft, non-tender; bowel sounds normal; no masses,  no organomegaly Pulses: 2+ and symmetric Skin: Skin color, texture, turgor normal. No rashes or lesions Lymph nodes: Cervical, supraclavicular, and axillary nodes normal.  No results found for: HGBA1C  Lab Results  Component Value Date   CREATININE 0.65 09/11/2017   CREATININE 0.68 10/27/2016   CREATININE 0.70 08/11/2016    Lab Results  Component Value Date   WBC 5.5 10/27/2016   HGB 12.6 10/27/2016   HCT 37.0 10/27/2016   PLT 254.0 10/27/2016   GLUCOSE 106 (H) 09/11/2017   CHOL 373 (H) 09/11/2017   TRIG 83.0 09/11/2017   HDL 68.20 09/11/2017   LDLDIRECT 252.3 06/12/2012   LDLCALC 289 (H) 09/11/2017   ALT 16 09/11/2017   AST 18 09/11/2017   NA 137 09/11/2017   K 4.0 09/11/2017   CL 100 09/11/2017   CREATININE 0.65 09/11/2017   BUN 22 09/11/2017   CO2 32 09/11/2017   TSH 1.620 10/27/2016    Mm Digital Screening Bilateral  Result Date: 08/17/2017 CLINICAL DATA:  Screening. EXAM: DIGITAL SCREENING BILATERAL MAMMOGRAM WITH CAD COMPARISON:  Previous exam(s). ACR Breast Density Category b: There are scattered areas of  fibroglandular density. FINDINGS: There are no findings suspicious for malignancy. Images were processed with CAD. IMPRESSION: No mammographic evidence of malignancy. A result letter of this screening mammogram will be mailed directly to the patient. RECOMMENDATION: Screening mammogram in one year. (Code:SM-B-01Y) BI-RADS CATEGORY  1: Negative. Electronically Signed   By: Lajean Manes M.D.   On: 08/17/2017 11:12    Assessment & Plan:   Problem List Items Addressed This Visit    Familial hyperlipidemia - Primary    She is intolerant of statin and developed intolerable symptoms of body aches, mental slowing and chills after her 3rd dose of Repatha.  Referring to cardiology to risk stratify  with cardiac ct       Relevant Orders   Ambulatory referral to Cardiology   Spondylolisthesis of lumbar region    With persistent low back pain not relieved with NSAIDs.   She is requesting refill on narcotics for prn use . Marland Kitchen Refill history confirmed via [place] Controlled Substance database, accessed by me today..she has had no narcotics sent in by any other  Provider. .refills for 90 days given.   Will add gabapentin at bedtime 300 mg        A total of 25 minutes of face to face time was spent with patient more than half of which was spent in counselling and coordination of care    I am having Madeline Young "[name]" start on gabapentin. I am also having her maintain her zolpidem, aspirin, cholecalciferol, FLUoxetine, Fish Oil, Lactobacillus-Inulin (CULTURELLE DIGESTIVE HEALTH PO), Polyethyl Glycol-Propyl Glycol, docusate sodium, diclofenac sodium, VYVANSE, folic acid, celecoxib, Evolocumab, and oxyCODONE-acetaminophen.  Meds ordered this encounter  Medications  . gabapentin (NEURONTIN) 100 MG capsule    Sig: Take 1 capsule (100 mg total) by mouth 3 (three) times daily.    Dispense:  90 capsule    Refill:  3  . DISCONTD: oxyCODONE-acetaminophen (PERCOCET/ROXICET) 5-325 MG tablet    Sig: Take 1-2  tablets by mouth daily as needed for moderate pain.    Dispense:  31 tablet    Refill:  0    May refill on or after October 22 2017  . DISCONTD: oxyCODONE-acetaminophen (PERCOCET/ROXICET) 5-325 MG tablet    Sig: Take 1-2 tablets by mouth daily as needed for moderate pain.    Dispense:  31 tablet    Refill:  0    May refill on or after November 22, 2017  . oxyCODONE-acetaminophen (PERCOCET/ROXICET) 5-325 MG tablet    Sig: Take 1-2 tablets by mouth daily as needed for moderate pain.    Dispense:  31 tablet    Refill:  0    May refill on or after December 20, 2017    Medications Discontinued During This Encounter  Medication Reason  . oxyCODONE-acetaminophen (PERCOCET/ROXICET) 5-325 MG tablet Reorder  . oxyCODONE-acetaminophen (PERCOCET/ROXICET) 5-325 MG tablet Reorder  . oxyCODONE-acetaminophen (PERCOCET/ROXICET) 5-325 MG tablet Reorder    Follow-up: Return in about 3 months (around 01/20/2018).   Crecencio Mc, MD

## 2017-10-22 NOTE — Patient Instructions (Addendum)
Do not resume Repatha  I will make a referral to [facility] for risk stratification   Consider water aerobics as an excellent form of aerobic exercise to help you lose weight   I have refilled oxycodone for 3 more months  Trial of gabapentin 100 mg at bedtime.  May take just at night and increase dose gradually to 300 mg ,  Or add a morning/afternoon dose

## 2017-10-23 NOTE — Assessment & Plan Note (Signed)
With persistent low back pain not relieved with NSAIDs.   She is requesting refill on narcotics for prn use . Marland Kitchen Refill history confirmed via [place] Controlled Substance database, accessed by me today..she has had no narcotics sent in by any other  Provider. .refills for 90 days given.   Will add gabapentin at bedtime 300 mg

## 2017-10-23 NOTE — Assessment & Plan Note (Signed)
She is intolerant of statin and developed intolerable symptoms of body aches, mental slowing and chills after her 3rd dose of Repatha.  Referring to cardiology to risk stratify with cardiac ct

## 2017-10-24 ENCOUNTER — Ambulatory Visit (INDEPENDENT_AMBULATORY_CARE_PROVIDER_SITE_OTHER): Payer: Medicare HMO | Admitting: Podiatry

## 2017-10-24 ENCOUNTER — Ambulatory Visit (INDEPENDENT_AMBULATORY_CARE_PROVIDER_SITE_OTHER): Payer: Medicare HMO

## 2017-10-24 ENCOUNTER — Encounter: Payer: Self-pay | Admitting: Podiatry

## 2017-10-24 DIAGNOSIS — M722 Plantar fascial fibromatosis: Secondary | ICD-10-CM

## 2017-10-24 DIAGNOSIS — Q828 Other specified congenital malformations of skin: Secondary | ICD-10-CM

## 2017-10-24 NOTE — Patient Instructions (Signed)

## 2017-10-24 NOTE — Progress Notes (Signed)
She presents today with chief complaint of painful lesion to the forefoot right.  This is really been bothering her recently.  She also complaining of pain to the left heel for the past 2-3 weeks she states that the heel is achy at night and very sharp and stabbing when she first stands up in the morning or after she has been sitting for a period of time.  Objective: Vital signs are stable she is alert and oriented x3 pulses remain palpable bilateral.  Due to the deformity of the forefoot and osteoarthritic changes and surgical intervention she has a plantar flexed fourth metatarsal resulting in reactive hyperkeratosis to the plantar aspect of the foot.  There is no underlying blood there is no ulceration to it.  Simply callus.  Left foot does demonstrate pain on palpation medial calcaneal tubercle of the left heel and radiographs taken today demonstrate soft tissue increase in density at the plantar fascial calcaneal insertion site.  No plantar spur is noted.  Assessment: Plantar flexed metatarsal resulting in reactive hyperkeratotic lesion right foot.  Left foot plantar fasciitis.  Plan: Discussed etiology pathology conservative versus surgical therapies.  At this point we injected the left heel after sterile alcohol swab with 20 mg of Kenalog and 5 of Marcaine.  She tolerated procedure well without complications.  Placed her in a plantar fascial brace discussed appropriate shoe gear stretching exercises ice therapy as your modifications.  I will follow-up with her in 1 month.

## 2017-10-29 ENCOUNTER — Ambulatory Visit: Payer: Medicare HMO | Admitting: Cardiovascular Disease

## 2017-11-07 ENCOUNTER — Ambulatory Visit: Payer: Medicare HMO | Admitting: Internal Medicine

## 2017-11-07 ENCOUNTER — Encounter: Payer: Self-pay | Admitting: Internal Medicine

## 2017-11-07 VITALS — BP 128/60 | HR 75 | Ht 67.0 in | Wt 177.8 lb

## 2017-11-07 DIAGNOSIS — E7849 Other hyperlipidemia: Secondary | ICD-10-CM

## 2017-11-07 DIAGNOSIS — I2510 Atherosclerotic heart disease of native coronary artery without angina pectoris: Secondary | ICD-10-CM | POA: Diagnosis not present

## 2017-11-07 MED ORDER — METOPROLOL TARTRATE 25 MG PO TABS: 12.5000 mg | ORAL_TABLET | ORAL | 0 refills | Status: DC

## 2017-11-07 NOTE — Progress Notes (Addendum)
New Outpatient Visit Date: 11/07/2017  Referring Provider: Crecencio Mc, MD [address] [place], [place] [address]  Chief Complaint: Elevated cholesterol  HPI:  Madeline Young is a 62 y.o. female who is being seen today for the evaluation of hyperlipidemia at the request of hyperlipidemia. She has a history of hyperlipidemia diagnosed in her 72's and severe arthritis, who presents for evaluation of Dr. Derrel Nip. [name] reports having been on multiple statins, including rosuvastatin and atorvastatin. She was intolerant of every statin that she has tried due to myalgias and worsening of her arthritis. She notes that after one trial of rosuvastatin, it took her almost 6 months to recover from severe joint pains. She was recently started on evolocumab by her PCP. She initially tolerated this well; however, a day or two after the third dose, she developed fatigue, headache, and memory problems that lasted several days before abating. After researching evolocumab on the internet, she discovered that other users of the medication had experienced similar side effects.  [name] denies chest pain and shortness of breath, though her mobility is somewhat limited by her widespread arthritis. She tries to walk when possible and also does yoga.. She notes an episode of jaw pain several years ago that concerned her as possible anginal equivalent. She underwent myocardial perfusion stress test at the time, which was low-risk. She has not had any other cardiovascular testing. [name] denies palpitations, lightheadedness, orthopnea, PND, and edema.  --------------------------------------------------------------------------------------------------  Cardiovascular History & Procedures: Cardiovascular Problems:  Familial hyperlipidemia  Risk Factors:  Hyperlipidemia  Cath/PCI:  None  CV Surgery:  None  EP Procedures and Devices:  None  Non-Invasive Evaluation(s):  Carotid artery  Doppler (11/14/16): Mild bilateral carotid plaquing with <40% stenosis.  Exercise MPI (05/09/07): Low-risk study without ischemia or scar. LVEF 68%.  Recent CV Pertinent Labs: Lab Results  Component Value Date   CHOL 373 (H) 09/11/2017   HDL 68.20 09/11/2017   LDLCALC 289 (H) 09/11/2017   LDLDIRECT 252.3 06/12/2012   TRIG 83.0 09/11/2017   CHOLHDL 5 09/11/2017   K 4.0 09/11/2017   BUN 22 09/11/2017   CREATININE 0.65 09/11/2017    --------------------------------------------------------------------------------------------------  Past Medical History:  Diagnosis Date  . 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 ADHD (attention deficit hyperactivity disorder)   . Allergy   . Anxiety   . Arthritis   . Cataract    bil cateracts removed  . Complication of anesthesia    first colonoscopy pt states she woke up  . Depression   . GERD (gastroesophageal reflux disease)   . Globus sensation   . Hyperlipidemia   . Insomnia   . Multinodular goiter (nontoxic)   . Psoriatic arthritis ([place])   . Spinal stenosis of lumbar region June 2016   MRI   . Statin intolerance 01/27/2013    Past Surgical History:  Procedure Laterality Date  . BACK SURGERY  02/17/2016   Dr. Jacqulynn Cadet- L4-L5 fusion   . BIOPSY THYROID  2014  . COLONOSCOPY W/ POLYPECTOMY    . EYE SURGERY     bilateral cataract surgery w/ lens implant  . FOOT SURGERY  2006   Right foot , secondary to severe loss of joint [facility])  . TONSILLECTOMY    . UPPER GASTROINTESTINAL ENDOSCOPY  2005   With empiric esophageal dilation    Current Meds  Medication Sig  . aspirin 81 MG tablet Take 81 mg by mouth daily.    . calcium-vitamin D (  OSCAL WITH D) 250-125 MG-UNIT tablet Take 1 tablet by mouth daily.  . celecoxib (CELEBREX) 200 MG capsule Take 1 capsule (200 mg total) by mouth 2 (two) times daily.  . cholecalciferol (VITAMIN D) 1000 UNITS tablet Take 1,000 Units by mouth daily.    . diclofenac sodium (VOLTAREN) 1 % GEL Apply topically 4  (four) times daily.  Marland Kitchen FLUoxetine (PROZAC) 40 MG capsule Take 40 mg by mouth daily.   Marland Kitchen gabapentin (NEURONTIN) 100 MG capsule Take 1 capsule (100 mg total) by mouth 3 (three) times daily.  . Lactobacillus-Inulin (CULTURELLE DIGESTIVE HEALTH PO) Take 1 tablet by mouth daily.   . Omega-3 Fatty Acids (FISH OIL) 1200 MG CAPS Take 1,200 mg by mouth daily.   Marland Kitchen oxyCODONE-acetaminophen (PERCOCET/ROXICET) 5-325 MG tablet Take 1-2 tablets by mouth daily as needed for moderate pain.  Vladimir Faster Glycol-Propyl Glycol (SYSTANE ULTRA) 0.4-0.3 % SOLN Place 1 drop into both eyes daily as needed (for dry eyes).  . predniSONE (STERAPRED UNI-PAK 48 TAB) 10 MG (48) TBPK tablet Take by mouth as needed.  Marland Kitchen VYVANSE 50 MG capsule Take 1 tablet by mouth every morning.  . zolpidem (AMBIEN) 10 MG tablet Take 5 mg by mouth at bedtime.   . [DISCONTINUED] calcium acetate (PHOSLO) 667 MG capsule Take by mouth daily at 12 noon.    Allergies: Sertraline hcl; Avelox [moxifloxacin hcl in nacl]; Latex; Macrobid [nitrofurantoin monohyd macro]; and Statins  Social History   Socioeconomic History  . Marital status: Married    Spouse name: Not on file  . Number of children: 2  . Years of education: Not on file  . Highest education level: Not on file  Social Needs  . Financial resource strain: Not on file  . Food insecurity - worry: Not on file  . Food insecurity - inability: Not on file  . Transportation needs - medical: Not on file  . Transportation needs - non-medical: Not on file  Occupational History  . Occupation: disability  Tobacco Use  . Smoking status: Former Smoker    Types: Cigarettes    Last attempt to quit: 08/21/1971    Years since quitting: 46.2  . Smokeless tobacco: Never Used  Substance and Sexual Activity  . Alcohol use: Yes    Alcohol/week: 4.2 oz    Types: 7 Glasses of wine per week  . Drug use: No  . Sexual activity: Not on file  Other Topics Concern  . Not on file  Social History Narrative    Married, retired Therapist, sports   2 daughters some grandchildren   2 caffeinated drinks daily   1 alcoholic beverage daily   No tobacco   04/29/2015    Family History  Problem Relation Age of Onset  . Heart disease Mother   . Hyperlipidemia Mother   . Coronary artery disease Father 68       CABG in early 82's  . Hyperlipidemia Father   . Colon cancer Neg Hx   . Esophageal cancer Neg Hx   . Rectal cancer Neg Hx   . Stomach cancer Neg Hx     Review of Systems: A 12-system review of systems was performed and was negative except as noted in the HPI.  --------------------------------------------------------------------------------------------------  Physical Exam: BP 128/60 (BP Location: Right Arm, Patient Position: Sitting, Cuff Size: Normal)   Pulse 75   Ht 5\' 7"  (1.702 m)   Wt 177 lb 12 oz (80.6 kg)   BMI 27.84 kg/m   General:  Overweight woman,  seated comfortably in the exam room. HEENT: No conjunctival pallor or scleral icterus. Moist mucous membranes. OP clear. Neck: Supple without lymphadenopathy, thyromegaly, JVD, or HJR. No carotid bruit. Lungs: Normal work of breathing. Clear to auscultation bilaterally without wheezes or crackles. Heart: Regular rate and rhythm without murmurs, rubs, or gallops. Non-displaced PMI. Abd: Bowel sounds present. Soft, NT/ND without hepatosplenomegaly Ext: No lower extremity edema. Radial, PT, and DP pulses are 2+ bilaterally.  Skin: Warm and dry without rash. Neuro: CNIII-XII intact. Strength and fine-touch sensation intact in upper and lower extremities bilaterally. Psych: Normal mood and affect.  EKG:  NSR with short PR interval. Otherwise, normal tracing.  Lab Results  Component Value Date   WBC 5.5 10/27/2016   HGB 12.6 10/27/2016   HCT 37.0 10/27/2016   MCV 91.7 10/27/2016   PLT 254.0 10/27/2016    Lab Results  Component Value Date   NA 137 09/11/2017   K 4.0 09/11/2017   CL 100 09/11/2017   CO2 32 09/11/2017   BUN 22  09/11/2017   CREATININE 0.65 09/11/2017   GLUCOSE 106 (H) 09/11/2017   ALT 16 09/11/2017    Lab Results  Component Value Date   CHOL 373 (H) 09/11/2017   HDL 68.20 09/11/2017   LDLCALC 289 (H) 09/11/2017   LDLDIRECT 252.3 06/12/2012   TRIG 83.0 09/11/2017   CHOLHDL 5 09/11/2017     --------------------------------------------------------------------------------------------------  ASSESSMENT AND PLAN: Familial hypercholesterolemia Ms. Seda has a long history of elevated LDL, most recently 289. Unfortunately, Ms. Pinela has been intolerant of multiple statins, and most recently has experienced several side effects after taking evolocumab. We discussed a retrial of low-dose statin therapy, which Ms. Bickle declined. We discussed a retrial of evolocumab, given the unusual constellation of symptoms versus switching to alirocumab. We have agreed to defer reinitiation of treatment for the time being, pending further risk assessment with cardiac CT (as detailed below). Regardless of the CT findings, Ms. Ivy needs to be on lipid lowing therapy. My preference would be a retrial of evolocumab. If symptoms recur, we will have her see the lipid clinic to discuss switching to alirocumab as well as explore potential studies.  Atherosclerotic cardiovascular disease  Ms. Murgia is at high risk for ASCVD, given her history of untreated familial hypercholesterolemia and father's history of premature coronary artery disease. Carotid artery Doppler showed mild bilateral carotid artery plaquing. She has not experienced any symptoms recently, though function assessment is quite limited due to significant mobility issues stemming from her arthritis. We have agreed to obtain a coronary CTA, which will allow [place] to assess both coronary artery calcium and degree of stenosis. I will have Ms. Plake take metoprolol tartrate 12.5 mg x 1 on the morning of the study. It is reasonable to continue low-dose aspirin for  primary prevention.  Follow-up: Return to clinic in ~6 weeks (after completion of coronary CTA).  Nelva Bush, MD 11/07/2017 7:46 PM

## 2017-11-07 NOTE — Patient Instructions (Addendum)
Medication Instructions:  Your physician wants you to do the following: 1. TAKE 1/2 tablet of the Metoprolol the morning of your CT   Testing/Procedures: Please arrive at the [facility] main entrance of [facility] at xx:xx AM (30-45 minutes prior to test start time)  [facility] [place], [place] [address] [phone]  Proceed to the [facility] Radiology Department (First Floor).  Please follow these instructions carefully (unless otherwise directed):  Hold all erectile dysfunction medications at least 48 hours prior to test.  On the Night Before the Test: . Drink plenty of water. . Do not consume any caffeinated/decaffeinated beverages or chocolate 12 hours prior to your test. . Do not take any antihistamines 12 hours prior to your test. . If you take Metformin do not take 24 hours prior to test. . If the patient has contrast allergy: ? Patient will need a prescription for Prednisone and very clear instructions (as follows): 1. Prednisone 50 mg - take 13 hours prior to test 2. Take another Prednisone 50 mg 7 hours prior to test 3. Take another Prednisone 50 mg 1 hour prior to test 4. Take Benadryl 50 mg 1 hour prior to test . Patient must complete all four doses of above prophylactic medications. . Patient will need a ride after test due to Benadryl.  On the Day of the Test: . Drink plenty of water. Do not drink any water within one hour of the test. . Do not eat any food 4 hours prior to the test. . You may take your regular medications prior to the test. . IF NOT ON A BETA BLOCKER - Take 50 mg of lopressor (metoprolol) one hour before the test. . HOLD Furosemide morning of the test.  After the Test: . Drink plenty of water. . After receiving IV contrast, you may experience a mild flushed feeling. This is normal. . On occasion, you may experience a mild rash up to 24 hours after the test. This is not dangerous. If this occurs, you  can take Benadryl 25 mg and increase your fluid intake. . If you experience trouble breathing, this can be serious. If it is severe call 911 IMMEDIATELY. If it is mild, please call our office. . If you take any of these medications: Glipizide/Metformin, Avandament, Glucavance, please do not take 48 hours after completing test.   Follow-Up: Your physician recommends that you schedule a follow-up appointment in: 6 weeks with Dr. Saunders Revel  It was a pleasure seeing you today here in the office. Please do not hesitate to give [place] a call back if you have any further questions. [place], BSN     Cardiac CT Angiogram A cardiac CT angiogram is a procedure to look at the heart and the area around the heart. It may be done to help find the cause of chest pains or other symptoms of heart disease. During this procedure, a large X-ray machine, called a CT scanner, takes detailed pictures of the heart and the surrounding area after a dye (contrast material) has been injected into blood vessels in the area. The procedure is also sometimes called a coronary CT angiogram, coronary artery scanning, or CTA. A cardiac CT angiogram allows the health care provider to see how well blood is flowing to and from the heart. The health care provider will be able to see if there are any problems, such as:  Blockage or narrowing of the coronary arteries in the heart.  Fluid around the heart.  Signs of weakness or disease in the muscles, valves, and tissues of the heart.  Tell a health care provider about:  Any allergies you have. This is especially important if you have had a previous allergic reaction to contrast dye.  All medicines you are taking, including vitamins, herbs, eye drops, creams, and over-the-counter medicines.  Any blood disorders you have.  Any surgeries you have had.  Any medical conditions you have.  Whether you are pregnant or may be pregnant.  Any anxiety disorders, chronic  pain, or other conditions you have that may increase your stress or prevent you from lying still. What are the risks? Generally, this is a safe procedure. However, problems may occur, including:  Bleeding.  Infection.  Allergic reactions to medicines or dyes.  Damage to other structures or organs.  Kidney damage from the dye or contrast that is used.  Increased risk of cancer from radiation exposure. This risk is low. Talk with your health care provider about: ? The risks and benefits of testing. ? How you can receive the lowest dose of radiation.  What happens before the procedure?  Wear comfortable clothing and remove any jewelry, glasses, dentures, and hearing aids.  Follow instructions from your health care provider about eating and drinking. This may include: ? For 12 hours before the test - avoid caffeine. This includes tea, coffee, soda, energy drinks, and diet pills. Drink plenty of water or other fluids that do not have caffeine in them. Being well-hydrated can prevent complications. ? For 4-6 hours before the test - stop eating and drinking. The contrast dye can cause nausea, but this is less likely if your stomach is empty.  Ask your health care provider about changing or stopping your regular medicines. This is especially important if you are taking diabetes medicines, blood thinners, or medicines to treat erectile dysfunction. What happens during the procedure?  Hair on your chest may need to be removed so that small sticky patches called electrodes can be placed on your chest. These will transmit information that helps to monitor your heart during the test.  An IV tube will be inserted into one of your veins.  You might be given a medicine to control your heart rate during the test. This will help to ensure that good images are obtained.  You will be asked to lie on an exam table. This table will slide in and out of the CT machine during the procedure.  Contrast dye  will be injected into the IV tube. You might feel warm, or you may get a metallic taste in your mouth.  You will be given a medicine (nitroglycerin) to relax (dilate) the arteries in your heart.  The table that you are lying on will move into the CT machine tunnel for the scan.  The person running the machine will give you instructions while the scans are being done. You may be asked to: ? Keep your arms above your head. ? Hold your breath. ? Stay very still, even if the table is moving.  When the scanning is complete, you will be moved out of the machine.  The IV tube will be removed. The procedure may vary among health care providers and hospitals. What happens after the procedure?  You might feel warm, or you may get a metallic taste in your mouth from the contrast dye.  You may have a headache from the nitroglycerin.  After the procedure, drink water or other fluids  to wash (flush) the contrast material out of your body.  Contact a health care provider if you have any symptoms of allergy to the contrast. These symptoms include: ? Shortness of breath. ? Rash or hives. ? A racing heartbeat.  Most people can return to their normal activities right after the procedure. Ask your health care provider what activities are safe for you.  It is up to you to get the results of your procedure. Ask your health care provider, or the department that is doing the procedure, when your results will be ready. Summary  A cardiac CT angiogram is a procedure to look at the heart and the area around the heart. It may be done to help find the cause of chest pains or other symptoms of heart disease.  During this procedure, a large X-ray machine, called a CT scanner, takes detailed pictures of the heart and the surrounding area after a dye (contrast material) has been injected into blood vessels in the area.  Ask your health care provider about changing or stopping your regular medicines before the  procedure. This is especially important if you are taking diabetes medicines, blood thinners, or medicines to treat erectile dysfunction.  After the procedure, drink water or other fluids to wash (flush) the contrast material out of your body. This information is not intended to replace advice given to you by your health care provider. Make sure you discuss any questions you have with your health care provider. Document Released: 09/07/2008 Document Revised: 08/14/2016 Document Reviewed: 08/14/2016 Elsevier Interactive Patient Education  2017 [employer].

## 2017-11-09 ENCOUNTER — Other Ambulatory Visit: Payer: Self-pay

## 2017-11-13 ENCOUNTER — Other Ambulatory Visit: Payer: Self-pay | Admitting: *Deleted

## 2017-11-13 NOTE — Addendum Note (Signed)
Addended by: Valora Corporal on: 11/13/2017 09:24 AM   Modules accepted: Orders

## 2017-11-14 ENCOUNTER — Encounter: Payer: Self-pay | Admitting: Podiatry

## 2017-11-14 ENCOUNTER — Ambulatory Visit: Payer: Medicare HMO | Admitting: Podiatry

## 2017-11-14 DIAGNOSIS — M722 Plantar fascial fibromatosis: Secondary | ICD-10-CM | POA: Diagnosis not present

## 2017-11-14 MED ORDER — METHYLPREDNISOLONE 4 MG PO TBPK: ORAL_TABLET | ORAL | 0 refills | Status: DC

## 2017-11-14 NOTE — Progress Notes (Signed)
She presents today for follow-up of her plantar fasciitis and states that it was good for a few days and then started hurting again.  She is referring to her left foot.  States that she went by the [facility] foot store and purchased orthotics that are very painful for her.  She states that she has her old orthotics that we prescribed for her at home.  Objective: Vital signs are stable she is alert and oriented x3.  Pulses are palpable.  Neurologic sensorium is intact.  She has pain on palpation medial calcaneal tubercle of the left heel.  Assessment: Intractable plantar fasciitis left heel.  Plan: Discussed etiology pathology conservative versus surgical therapies.  Started her on a Medrol Dosepak she will discontinue the Celebrex until she is completed the Medrol Dosepak and then will start that back.  I also injected the area today after sterile Betadine skin prep with 20 mg of Kenalog 5 mg Marcaine to the plantar medial aspect of the left heel.  She tolerated procedure well without complications.  She will follow-up with me in 3-4 weeks at which time she will bring her old orthotics with her.  We may need to make her another set.

## 2017-11-16 ENCOUNTER — Other Ambulatory Visit: Payer: Self-pay | Admitting: Internal Medicine

## 2017-11-21 ENCOUNTER — Ambulatory Visit: Payer: Medicare HMO | Admitting: Podiatry

## 2017-11-28 ENCOUNTER — Telehealth: Payer: Self-pay | Admitting: *Deleted

## 2017-11-28 DIAGNOSIS — Z01812 Encounter for preprocedural laboratory examination: Secondary | ICD-10-CM

## 2017-11-28 NOTE — Telephone Encounter (Signed)
Spoke with patient and reviewed that we would like her to have some lab work done next week prior to her upcoming scan. Advised for her to go to [facility] Entrance and check in at the front desk for labs. She verbalized understanding of our conversation, agreement with plan, and had no further questions at this time. Order entered and instructed go sometime next week.

## 2017-11-29 ENCOUNTER — Telehealth: Payer: Self-pay | Admitting: Internal Medicine

## 2017-11-29 NOTE — Telephone Encounter (Signed)
Please advise 

## 2017-11-29 NOTE — Telephone Encounter (Signed)
Copied from [place] [id number]. Topic: Quick Communication - See Telephone Encounter >> Nov 29, 2017  4:17 PM Percell Belt A wrote: CRM for notification. See Telephone encounter for -- pt called in and pharmacy only gave her 30 pills .  She stated that script was sent for 1 tab per day but she has always taken 2 per day.  She has already picked it up for the 30 days but she would like it corrected?    Best number [phone]  11/29/17.

## 2017-12-04 ENCOUNTER — Telehealth: Payer: Self-pay | Admitting: *Deleted

## 2017-12-04 NOTE — Telephone Encounter (Signed)
LMTCB. Need to ask pt what medication she is talking about in the message below. PEC may speak with pt.

## 2017-12-04 NOTE — Telephone Encounter (Signed)
Copied from [place]. Topic: Quick Communication - See Telephone Encounter >> Dec 04, 2017  4:52 PM Adair Laundry, CMA wrote: CRM for notification. See Telephone encounter for:   12/04/17.  PEC may speak with pt.  >> Dec 04, 2017  4:59 PM Percell Belt A wrote: Verified med she was speaking about   celecoxib (CELEBREX) 200 MG capsule [[id number]]

## 2017-12-05 MED ORDER — CELECOXIB 200 MG PO CAPS: 200.0000 mg | ORAL_CAPSULE | Freq: Two times a day (BID) | ORAL | 3 refills | Status: DC

## 2017-12-05 NOTE — Telephone Encounter (Signed)
Script corrected sent to pharmacy

## 2017-12-06 DIAGNOSIS — M545 Low back pain: Secondary | ICD-10-CM | POA: Diagnosis not present

## 2017-12-06 DIAGNOSIS — J449 Chronic obstructive pulmonary disease, unspecified: Secondary | ICD-10-CM | POA: Diagnosis not present

## 2017-12-06 DIAGNOSIS — I2510 Atherosclerotic heart disease of native coronary artery without angina pectoris: Secondary | ICD-10-CM | POA: Diagnosis not present

## 2017-12-06 DIAGNOSIS — R69 Illness, unspecified: Secondary | ICD-10-CM | POA: Diagnosis not present

## 2017-12-14 ENCOUNTER — Ambulatory Visit (HOSPITAL_COMMUNITY): Payer: Medicare HMO

## 2017-12-14 ENCOUNTER — Ambulatory Visit (HOSPITAL_COMMUNITY)
Admission: RE | Admit: 2017-12-14 | Discharge: 2017-12-14 | Disposition: A | Discharge status: Home / Self Care | Payer: Medicare HMO | Source: Ambulatory Visit | Attending: Internal Medicine | Admitting: Internal Medicine

## 2017-12-14 DIAGNOSIS — I700 Atherosclerosis of aorta: Secondary | ICD-10-CM | POA: Insufficient documentation

## 2017-12-14 DIAGNOSIS — J9811 Atelectasis: Secondary | ICD-10-CM | POA: Diagnosis not present

## 2017-12-14 DIAGNOSIS — E7849 Other hyperlipidemia: Secondary | ICD-10-CM | POA: Diagnosis present

## 2017-12-14 DIAGNOSIS — I2510 Atherosclerotic heart disease of native coronary artery without angina pectoris: Secondary | ICD-10-CM | POA: Diagnosis present

## 2017-12-14 DIAGNOSIS — R079 Chest pain, unspecified: Secondary | ICD-10-CM | POA: Diagnosis not present

## 2017-12-14 LAB — POCT I-STAT CREATININE: Creatinine, Ser: 0.9 mg/dL (ref 0.44–1.00)

## 2017-12-14 MED ORDER — NITROGLYCERIN 0.4 MG SL SUBL
SUBLINGUAL_TABLET | SUBLINGUAL | Status: AC
  Administered 2017-12-14: 0.8 mg via SUBLINGUAL
  Filled 2017-12-14: qty 2

## 2017-12-14 MED ORDER — IOPAMIDOL (ISOVUE-370) INJECTION 76%
INTRAVENOUS | Status: AC
  Administered 2017-12-14: 80 mL via INTRAVENOUS
  Filled 2017-12-14: qty 100

## 2017-12-14 MED ORDER — NITROGLYCERIN 0.4 MG SL SUBL
0.8000 mg | SUBLINGUAL_TABLET | Freq: Once | SUBLINGUAL | Status: AC
  Administered 2017-12-14: 0.8 mg via SUBLINGUAL

## 2017-12-19 ENCOUNTER — Encounter: Payer: Self-pay | Admitting: Podiatry

## 2017-12-19 ENCOUNTER — Ambulatory Visit: Payer: Medicare HMO | Admitting: Podiatry

## 2017-12-19 DIAGNOSIS — Q828 Other specified congenital malformations of skin: Secondary | ICD-10-CM

## 2017-12-19 NOTE — Progress Notes (Signed)
She presents today for follow-up of her plantar fasciitis left.  States that it seems to be doing really well.  She states is at least 50% improved but does not hurt every day I continue to wear my night boot and a brace rubs the top of my foot.  She states that the injection did help.  She would like for me to look at her other foot where she has a reactive hyperkeratosis plantarly.  Objective: No reproducible pain on palpation medial plantar tubercle of the left heel.  Pulses remain palpable.  Right foot demonstrates plantar flexed fourth metatarsal resulting in reactive hyperkeratosis beneath the fourth or third metatarsal.  This was debrided today without incident.  Assessment: Resolving plantar fasciitis left plantar flexed metatarsal with reactive hyperkeratosis right foot.  Plan: Debrided all reactive hyperkeratosis we will consider an injection to the left foot next visit.

## 2017-12-31 ENCOUNTER — Encounter: Payer: Self-pay | Admitting: Internal Medicine

## 2017-12-31 ENCOUNTER — Ambulatory Visit (INDEPENDENT_AMBULATORY_CARE_PROVIDER_SITE_OTHER): Payer: Medicare HMO | Admitting: Internal Medicine

## 2017-12-31 DIAGNOSIS — R69 Illness, unspecified: Secondary | ICD-10-CM | POA: Diagnosis not present

## 2017-12-31 DIAGNOSIS — E7849 Other hyperlipidemia: Secondary | ICD-10-CM

## 2017-12-31 DIAGNOSIS — M4316 Spondylolisthesis, lumbar region: Secondary | ICD-10-CM

## 2017-12-31 MED ORDER — OXYCODONE-ACETAMINOPHEN 5-325 MG PO TABS: 1.0000 | ORAL_TABLET | Freq: Every day | ORAL | 0 refills | Status: DC | PRN

## 2017-12-31 NOTE — Patient Instructions (Addendum)
[facility] they  are given in the afternoon  Refills on percocet given for April May and June

## 2017-12-31 NOTE — Progress Notes (Signed)
Subjective:  Patient ID: Madeline Young, female    DOB: 16-Feb-1956  Age: 62 y.o. MRN: [id number]  CC: Diagnoses of Spondylolisthesis of lumbar region and Familial hyperlipidemia were pertinent to this visit.  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 Last refill was dated  December 20 2017  Saw cardiology for hyperlipidemia with intolerance of statins and PSK9 inhibitor. .  Calcium score was zero .   No placque in any arteries    Back pain : prior to her surgery she had two successful ESI's  So she decided to have another one recently by [facility]. "it was a disaster. "  She did it without anesthesia, and could not tolerate the probing procedure which she states was protracted in duration resulting in nausea and syncope .  The injection was done but did not help. She is limiting her use of percocet to once daily .  Using celebrex   Had a flare up of plantar fasciitis treated by [facility] with 2 injections and a brace .  Took 2 months to resolve.  Reduced her dose of prozac 2 months ago due to symptoms that resolved when she reduced her dose,  Prescribed by her psychiatrist   Current dose is 20 mg daily.  Still taking a yoga class for seniors.      Outpatient Medications Prior to Visit  Medication Sig Dispense Refill  . aspirin 81 MG tablet Take 81 mg by mouth daily.      . calcium-vitamin D (OSCAL WITH D) 250-125 MG-UNIT tablet Take 1 tablet by mouth daily.    . celecoxib (CELEBREX) 200 MG capsule Take 1 capsule (200 mg total) by mouth 2 (two) times daily. 180 capsule 3  . cholecalciferol (VITAMIN D) 1000 UNITS tablet Take 1,000 Units by mouth daily.      . diclofenac sodium (VOLTAREN) 1 % GEL Apply topically 4 (four) times daily.    Marland Kitchen FLUoxetine (PROZAC) 20 MG capsule     . Lactobacillus-Inulin (CULTURELLE DIGESTIVE HEALTH PO) Take 1 tablet by mouth daily.     .  Omega-3 Fatty Acids (FISH OIL) 1200 MG CAPS Take 1,200 mg by mouth daily.     Vladimir Faster Glycol-Propyl Glycol (SYSTANE ULTRA) 0.4-0.3 % SOLN Place 1 drop into both eyes daily as needed (for dry eyes).    Marland Kitchen VYVANSE 50 MG capsule Take 1 tablet by mouth every morning.    . zolpidem (AMBIEN) 10 MG tablet Take 5 mg by mouth at bedtime.     Marland Kitchen oxyCODONE-acetaminophen (PERCOCET/ROXICET) 5-325 MG tablet Take 1-2 tablets by mouth daily as needed for moderate pain. 31 tablet 0  . predniSONE (STERAPRED UNI-PAK 48 TAB) 10 MG (48) TBPK tablet Take by mouth as needed.    Marland Kitchen FLUoxetine (PROZAC) 40 MG capsule Take 40 mg by mouth daily.     Marland Kitchen gabapentin (NEURONTIN) 100 MG capsule Take 1 capsule (100 mg total) by mouth 3 (three) times daily. (Patient not taking: Reported on 12/31/2017) 90 capsule 3  . methylPREDNISolone (MEDROL DOSEPAK) 4 MG TBPK tablet 6 day dose pack - take as directed (Patient not taking: Reported on 12/31/2017) 21 tablet 0  . metoprolol tartrate (LOPRESSOR) 25 MG tablet Take 0.5 tablets (12.5 mg total) by mouth as directed. (Patient not taking: Reported on 12/31/2017) 1 tablet 0   No facility-administered medications prior to visit.     Review  of Systems;  Patient denies headache, fevers, malaise, unintentional weight loss, skin rash, eye pain, sinus congestion and sinus pain, sore throat, dysphagia,  hemoptysis , cough, dyspnea, wheezing, chest pain, palpitations, orthopnea, edema, abdominal pain, nausea, melena, diarrhea, constipation, flank pain, dysuria, hematuria, urinary  Frequency, nocturia, numbness, tingling, seizures,  Focal weakness, Loss of consciousness,  Tremor, insomnia, depression, anxiety, and suicidal ideation.      Objective:  BP 120/60 (BP Location: Right Arm, Patient Position: Sitting, Cuff Size: Normal)   Pulse 87   Temp 98.1 F (36.7 C) (Oral)   Resp 15   Ht 5\' 7"  (1.702 m)   Wt 176 lb 12.8 oz (80.2 kg)   SpO2 98%   BMI 27.69 kg/m   BP Readings from Last 3  Encounters:  12/31/17 120/60  12/14/17 105/75  11/07/17 128/60    Wt Readings from Last 3 Encounters:  12/31/17 176 lb 12.8 oz (80.2 kg)  11/07/17 177 lb 12 oz (80.6 kg)  10/22/17 177 lb (80.3 kg)    General appearance: alert, cooperative and appears stated age Ears: normal TM's and external ear canals both ears Throat: lips, mucosa, and tongue normal; teeth and gums normal Neck: no adenopathy, no carotid bruit, supple, symmetrical, trachea midline and thyroid not enlarged, symmetric, no tenderness/mass/nodules Back: symmetric, no curvature. ROM normal. No CVA tenderness. Lungs: clear to auscultation bilaterally Heart: regular rate and rhythm, S1, S2 normal, no murmur, click, rub or gallop Abdomen: soft, non-tender; bowel sounds normal; no masses,  no organomegaly Pulses: 2+ and symmetric Skin: Skin color, texture, turgor normal. No rashes or lesions Lymph nodes: Cervical, supraclavicular, and axillary nodes normal.  No results found for: HGBA1C  Lab Results  Component Value Date   CREATININE 0.90 12/14/2017   CREATININE 0.65 09/11/2017   CREATININE 0.68 10/27/2016    Lab Results  Component Value Date   WBC 5.5 10/27/2016   HGB 12.6 10/27/2016   HCT 37.0 10/27/2016   PLT 254.0 10/27/2016   GLUCOSE 106 (H) 09/11/2017   CHOL 373 (H) 09/11/2017   TRIG 83.0 09/11/2017   HDL 68.20 09/11/2017   LDLDIRECT 252.3 06/12/2012   LDLCALC 289 (H) 09/11/2017   ALT 16 09/11/2017   AST 18 09/11/2017   NA 137 09/11/2017   K 4.0 09/11/2017   CL 100 09/11/2017   CREATININE 0.90 12/14/2017   BUN 22 09/11/2017   CO2 32 09/11/2017   TSH 1.620 10/27/2016    Ct Coronary Morph W/cta Cor W/score W/ca W/cm &/or Wo/cm  Addendum Date: 12/14/2017   ADDENDUM REPORT: 12/14/2017 15:57 CLINICAL DATA:  Chest pain EXAM: Cardiac CTA MEDICATIONS: Sub lingual nitro. 4mg  x 2 and lopressor 5mg  IV TECHNIQUE: The patient was scanned on a Siemens 440 slice scanner. Gantry rotation speed was 240 msecs.  Collimation was 0.6 mm. A 100 kV prospective scan was triggered in the ascending thoracic aorta at 35-75% of the R-R interval. Average HR during the scan was 60 bpm. The 3D data set was interpreted on a dedicated work station using MPR, MIP and VRT modes. A total of 80cc of contrast was used. FINDINGS: Non-cardiac: See separate report from [facility]. Calcium Score: Coronary artery calcium score 0 Agatston units. Coronary Arteries: Right dominant with no anomalies LM: No plaque or stenosis. LAD system: No plaque or stenosis. Circumflex system: Large PLOM, small AV LCx.  No plaque or stenosis. RCA system: No plaque or stenosis. IMPRESSION: 1. Coronary artery calcium score 0 Agatston units. This suggests low risk for future  cardiac events. 2.  No obstructive coronary disease noted. Dalton Mclean Electronically Signed   By: Loralie Champagne M.D.   On: 12/14/2017 15:57   Result Date: 12/14/2017 EXAM: OVER-READ INTERPRETATION  CT CHEST The following report is an over-read performed by radiologist Dr. Rolm Baptise of [facility], [place] on 12/14/2017. This over-read does not include interpretation of cardiac or coronary anatomy or pathology. The coronary CTA interpretation by the cardiologist is attached. COMPARISON:  None. FINDINGS: Vascular: Heart is normal size. Visualized aorta normal caliber. Scattered descending aortic calcifications. Mediastinum/Nodes: No adenopathy in the lower mediastinum or hila. Lungs/Pleura: Dependent atelectasis in the lower lobes. No effusions. Upper Abdomen: Imaging into the upper abdomen shows no acute findings. Musculoskeletal: Chest wall soft tissues are unremarkable. No acute bony abnormality. IMPRESSION: Dependent atelectasis in the lower lobes. Scattered descending aortic calcifications. No acute findings. Electronically Signed: By: Rolm Baptise M.D. On: 12/14/2017 10:50    Assessment & Plan:   Problem List Items Addressed This Visit    Spondylolisthesis of lumbar  region    With persistent low back pain not relieved with NSAIDs or recent ESI. Marland Kitchen   She is requesting refill on narcotics for prn use . Marland Kitchen Refill history confirmed via [place] Controlled Substance database, accessed by me today..she has had no narcotics sent in by any other  Provider. .refills for 90 days given.         Familial hyperlipidemia    She has demonstrated statin intolerance and stopped Repatha due to symptoms she attributed to the medication that she now feels may have not been caused by the medication.   Her cardiac evaluation resulted in a zero calcium score on her cardiac CT .  She has been advised to follow a Mediterranean Diet and continue to participate in exercise daily as tolerated .         A total of 25 minutes of face to face time was spent with patient more than half of which was spent in counselling about the above mentioned conditions  and coordination of care    I have discontinued Amylee D. Severt "[name]"'s gabapentin, metoprolol tartrate, and methylPREDNISolone. I am also having her maintain her zolpidem, aspirin, cholecalciferol, Fish Oil, Lactobacillus-Inulin (CULTURELLE DIGESTIVE HEALTH PO), Polyethyl Glycol-Propyl Glycol, diclofenac sodium, VYVANSE, predniSONE, calcium-vitamin D, celecoxib, FLUoxetine, oxyCODONE-acetaminophen, and oxyCODONE-acetaminophen.  Meds ordered this encounter  Medications  . DISCONTD: oxyCODONE-acetaminophen (PERCOCET/ROXICET) 5-325 MG tablet    Sig: Take 1-2 tablets by mouth daily as needed for moderate pain.    Dispense:  45 tablet    Refill:  0    May refill on or after   April  12 , 2019  . oxyCODONE-acetaminophen (PERCOCET/ROXICET) 5-325 MG tablet    Sig: Take 1-2 tablets by mouth daily as needed for moderate pain.    Dispense:  45 tablet    Refill:  0    May refill on or after  May 12 , 2019  . oxyCODONE-acetaminophen (PERCOCET/ROXICET) 5-325 MG tablet    Sig: Take 1-2 tablets by mouth daily as needed for moderate pain.     Dispense:  45 tablet    Refill:  0    May refill on or after   June   12 , 2019    Medications Discontinued During This Encounter  Medication Reason  . FLUoxetine (PROZAC) 40 MG capsule Change in therapy  . gabapentin (NEURONTIN) 100 MG capsule Patient has not taken in last 30 days  . methylPREDNISolone (MEDROL DOSEPAK) 4  MG TBPK tablet Completed Course  . oxyCODONE-acetaminophen (PERCOCET/ROXICET) 5-325 MG tablet Reorder  . oxyCODONE-acetaminophen (PERCOCET/ROXICET) 5-325 MG tablet Reorder  . metoprolol tartrate (LOPRESSOR) 25 MG tablet     Follow-up: Return in about 3 months (around 04/02/2018) for medication refill .   Crecencio Mc, MD

## 2018-01-01 ENCOUNTER — Ambulatory Visit: Payer: Medicare HMO | Admitting: Podiatry

## 2018-01-01 ENCOUNTER — Encounter: Payer: Self-pay | Admitting: Podiatry

## 2018-01-01 ENCOUNTER — Ambulatory Visit (INDEPENDENT_AMBULATORY_CARE_PROVIDER_SITE_OTHER): Payer: Medicare HMO

## 2018-01-01 DIAGNOSIS — S99921A Unspecified injury of right foot, initial encounter: Secondary | ICD-10-CM

## 2018-01-01 NOTE — Progress Notes (Signed)
   HPI: 62 year old female presenting today with a chief complaint of an injury to the dorsum of the right foot that occurred yesterday. She reports associated pain, swelling and bruising of the area. Patient states the foot was ran over by an electric wheel chair which caused the symptoms. Bearing weight and walking increase the pain. She has been wearing an old CAM boot which helps alleviate some of the pressure from the foot. Patient is here for further evaluation and treatment.   Past Medical History:  Diagnosis Date  . 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 ADHD (attention deficit hyperactivity disorder)   . Allergy   . Anxiety   . Arthritis   . Cataract    bil cateracts removed  . Complication of anesthesia    first colonoscopy pt states she woke up  . Depression   . GERD (gastroesophageal reflux disease)   . Globus sensation   . Hyperlipidemia   . Insomnia   . Multinodular goiter (nontoxic)   . Psoriatic arthritis ([place])   . Spinal stenosis of lumbar region June 2016   MRI   . Statin intolerance 01/27/2013     Physical Exam: General: The patient is alert and oriented x3 in no acute distress.  Dermatology: Skin is warm, dry and supple bilateral lower extremities. Negative for open lesions or macerations.  Vascular: Edema with ecchymosis noted to the dorsum of the right foot. Palpable pedal pulses bilaterally. Capillary refill within normal limits.  Neurological: Epicritic and protective threshold grossly intact bilaterally.   Musculoskeletal Exam: Pain with palpation to the dorsum of the right foot. Range of motion within normal limits to all pedal and ankle joints bilateral. Muscle strength 5/5 in all groups bilateral.   Radiographic Exam:  Normal osseous mineralization. Joint spaces preserved. No fracture/dislocation/boney destruction.    Assessment: - Soft tissue injury right dorsal foot   Plan of Care:  - Patient evaluated. X-Rays reviewed and are negative for  fracture.  - Short CAM boot dispensed. Weightbearing as tolerated. - Compression anklet dispensed.  - Continue taking oral Celebrex as prescribed by rheumatologist.  - Return to clinic as needed.     Edrick Kins, DPM [facility]  Dr. Edrick Kins, DPM    [address], [place] [address]                Office [phone]  Fax [phone]

## 2018-01-01 NOTE — Assessment & Plan Note (Signed)
She has demonstrated statin intolerance and stopped Repatha due to symptoms she attributed to the medication that she now feels may have not been caused by the medication.   Her cardiac evaluation resulted in a zero calcium score on her cardiac CT .  She has been advised to follow a Mediterranean Diet and continue to participate in exercise daily as tolerated .

## 2018-01-01 NOTE — Assessment & Plan Note (Signed)
With persistent low back pain not relieved with NSAIDs or recent ESI. Marland Kitchen   She is requesting refill on narcotics for prn use . Marland Kitchen Refill history confirmed via [place] Controlled Substance database, accessed by me today..she has had no narcotics sent in by any other  Provider. .refills for 90 days given.

## 2018-02-08 DIAGNOSIS — M150 Primary generalized (osteo)arthritis: Secondary | ICD-10-CM | POA: Diagnosis not present

## 2018-02-08 DIAGNOSIS — M5136 Other intervertebral disc degeneration, lumbar region: Secondary | ICD-10-CM | POA: Diagnosis not present

## 2018-02-08 DIAGNOSIS — M797 Fibromyalgia: Secondary | ICD-10-CM | POA: Diagnosis not present

## 2018-02-08 DIAGNOSIS — L4050 Arthropathic psoriasis, unspecified: Secondary | ICD-10-CM | POA: Diagnosis not present

## 2018-02-08 DIAGNOSIS — L401 Generalized pustular psoriasis: Secondary | ICD-10-CM | POA: Diagnosis not present

## 2018-02-12 DIAGNOSIS — M1288 Other specific arthropathies, not elsewhere classified, other specified site: Secondary | ICD-10-CM | POA: Diagnosis not present

## 2018-02-12 DIAGNOSIS — M545 Low back pain: Secondary | ICD-10-CM | POA: Diagnosis not present

## 2018-02-12 DIAGNOSIS — R030 Elevated blood-pressure reading, without diagnosis of hypertension: Secondary | ICD-10-CM | POA: Diagnosis not present

## 2018-02-12 DIAGNOSIS — Z6829 Body mass index (BMI) 29.0-29.9, adult: Secondary | ICD-10-CM | POA: Diagnosis not present

## 2018-02-12 DIAGNOSIS — S32009K Unspecified fracture of unspecified lumbar vertebra, subsequent encounter for fracture with nonunion: Secondary | ICD-10-CM | POA: Diagnosis not present

## 2018-02-12 DIAGNOSIS — M5136 Other intervertebral disc degeneration, lumbar region: Secondary | ICD-10-CM | POA: Diagnosis not present

## 2018-03-26 DIAGNOSIS — L401 Generalized pustular psoriasis: Secondary | ICD-10-CM | POA: Diagnosis not present

## 2018-03-26 DIAGNOSIS — M5136 Other intervertebral disc degeneration, lumbar region: Secondary | ICD-10-CM | POA: Diagnosis not present

## 2018-03-26 DIAGNOSIS — M797 Fibromyalgia: Secondary | ICD-10-CM | POA: Diagnosis not present

## 2018-03-26 DIAGNOSIS — L4050 Arthropathic psoriasis, unspecified: Secondary | ICD-10-CM | POA: Diagnosis not present

## 2018-03-26 DIAGNOSIS — M150 Primary generalized (osteo)arthritis: Secondary | ICD-10-CM | POA: Diagnosis not present

## 2018-04-09 ENCOUNTER — Ambulatory Visit: Payer: Medicare HMO | Admitting: Internal Medicine

## 2018-04-09 ENCOUNTER — Encounter: Payer: Self-pay | Admitting: Internal Medicine

## 2018-04-09 DIAGNOSIS — M4316 Spondylolisthesis, lumbar region: Secondary | ICD-10-CM | POA: Diagnosis not present

## 2018-04-09 MED ORDER — OXYCODONE-ACETAMINOPHEN 5-325 MG PO TABS: 1.0000 | ORAL_TABLET | Freq: Every day | ORAL | 0 refills | Status: DC | PRN

## 2018-04-09 MED ORDER — DULOXETINE HCL 30 MG PO CPEP: 30.0000 mg | ORAL_CAPSULE | Freq: Every day | ORAL | 3 refills | Status: DC

## 2018-04-09 NOTE — Patient Instructions (Addendum)
I recommend getting in the water as many days as possible!  Put  yourself first.   I am recommending a trial of Cymbalta starting at 30 mg daily in the evening.    Duloxetine delayed-release capsules What is this medicine? DULOXETINE (doo LOX e teen) is used to treat depression, anxiety, and different types of chronic pain. This medicine may be used for other purposes; ask your health care provider or pharmacist if you have questions. COMMON BRAND NAME(S): Cymbalta, [place] What should I tell my health care provider before I take this medicine? They need to know if you have any of these conditions: -bipolar disorder or a family history of bipolar disorder -glaucoma -kidney disease -liver disease -suicidal thoughts or a previous suicide attempt -taken medicines called MAOIs like Carbex, Eldepryl, Marplan, Nardil, and Parnate within 14 days -an unusual reaction to duloxetine, other medicines, foods, dyes, or preservatives -pregnant or trying to get pregnant -breast-feeding How should I use this medicine? Take this medicine by mouth with a glass of water. Follow the directions on the prescription label. Do not cut, crush or chew this medicine. You can take this medicine with or without food. Take your medicine at regular intervals. Do not take your medicine more often than directed. Do not stop taking this medicine suddenly except upon the advice of your doctor. Stopping this medicine too quickly may cause serious side effects or your condition may worsen. A special MedGuide will be given to you by the pharmacist with each prescription and refill. Be sure to read this information carefully each time. Talk to your pediatrician regarding the use of this medicine in children. While this drug may be prescribed for children as young as 78 years of age for selected conditions, precautions do apply. Overdosage: If you think you have taken too much of this medicine contact a poison control center or  emergency room at once. NOTE: This medicine is only for you. Do not share this medicine with others. What if I miss a dose? If you miss a dose, take it as soon as you can. If it is almost time for your next dose, take only that dose. Do not take double or extra doses. What may interact with this medicine? Do not take this medicine with any of the following medications: -desvenlafaxine -levomilnacipran -linezolid -MAOIs like Carbex, Eldepryl, Marplan, Nardil, and Parnate -methylene blue (injected into a vein) -milnacipran -thioridazine -venlafaxine This medicine may also interact with the following medications: -alcohol -amphetamines -aspirin and aspirin-like medicines -certain antibiotics like ciprofloxacin and enoxacin -certain medicines for blood pressure, heart disease, irregular heart beat -certain medicines for depression, anxiety, or psychotic disturbances -certain medicines for migraine headache like almotriptan, eletriptan, frovatriptan, naratriptan, rizatriptan, sumatriptan, zolmitriptan -certain medicines that treat or prevent blood clots like warfarin, enoxaparin, and dalteparin -cimetidine -fentanyl -lithium -NSAIDS, medicines for pain and inflammation, like ibuprofen or naproxen -phentermine -procarbazine -rasagiline -sibutramine -St. John's wort -theophylline -tramadol -tryptophan This list may not describe all possible interactions. Give your health care provider a list of all the medicines, herbs, non-prescription drugs, or dietary supplements you use. Also tell them if you smoke, drink alcohol, or use illegal drugs. Some items may interact with your medicine. What should I watch for while using this medicine? Tell your doctor if your symptoms do not get better or if they get worse. Visit your doctor or health care professional for regular checks on your progress. Because it may take several weeks to see the full effects of this medicine,  it is important to  continue your treatment as prescribed by your doctor. Patients and their families should watch out for new or worsening thoughts of suicide or depression. Also watch out for sudden changes in feelings such as feeling anxious, agitated, panicky, irritable, hostile, aggressive, impulsive, severely restless, overly excited and hyperactive, or not being able to sleep. If this happens, especially at the beginning of treatment or after a change in dose, call your health care professional. Dennis Bast may get drowsy or dizzy. Do not drive, use machinery, or do anything that needs mental alertness until you know how this medicine affects you. Do not stand or sit up quickly, especially if you are an older patient. This reduces the risk of dizzy or fainting spells. Alcohol may interfere with the effect of this medicine. Avoid alcoholic drinks. This medicine can cause an increase in blood pressure. This medicine can also cause a sudden drop in your blood pressure, which may make you feel faint and increase the chance of a fall. These effects are most common when you first start the medicine or when the dose is increased, or during use of other medicines that can cause a sudden drop in blood pressure. Check with your doctor for instructions on monitoring your blood pressure while taking this medicine. Your mouth may get dry. Chewing sugarless gum or sucking hard candy, and drinking plenty of water may help. Contact your doctor if the problem does not go away or is severe. What side effects may I notice from receiving this medicine? Side effects that you should report to your doctor or health care professional as soon as possible: -allergic reactions like skin rash, itching or hives, swelling of the face, lips, or tongue -anxious -breathing problems -confusion -changes in vision -chest pain -confusion -elevated mood, decreased need for sleep, racing thoughts, impulsive behavior -eye pain -fast, irregular  heartbeat -feeling faint or lightheaded, falls -feeling agitated, angry, or irritable -hallucination, loss of contact with reality -high blood pressure -loss of balance or coordination -palpitations -redness, blistering, peeling or loosening of the skin, including inside the mouth -restlessness, pacing, inability to keep still -seizures -stiff muscles -suicidal thoughts or other mood changes -trouble passing urine or change in the amount of urine -trouble sleeping -unusual bleeding or bruising -unusually weak or tired -vomiting -yellowing of the eyes or skin Side effects that usually do not require medical attention (report to your doctor or health care professional if they continue or are bothersome): -change in sex drive or performance -change in appetite or weight -constipation -dizziness -dry mouth -headache -increased sweating -nausea -tired This list may not describe all possible side effects. Call your doctor for medical advice about side effects. You may report side effects to FDA at 1-800-FDA-1088. Where should I keep my medicine? Keep out of the reach of children. Store at room temperature between 20 and 25 degrees C (68 to 77 degrees F). Throw away any unused medicine after the expiration date. NOTE: This sheet is a summary. It may not cover all possible information. If you have questions about this medicine, talk to your doctor, pharmacist, or health care provider.  2018 Elsevier/Gold Standard (2016-02-24 18:16:03)

## 2018-04-09 NOTE — Progress Notes (Signed)
Subjective:  Patient ID: Madeline Young, female    DOB: [birth year]  Age: 62 y.o. MRN: [id number]  CC: The encounter diagnosis was Spondylolisthesis of lumbar region.  HPI Madeline Young presents for medication refill ,  Last seen in March and oxycodone started for management of spondylolithesis , lumbar   Seeing a new rheumatologist.  Dr. Dossie Der .pain has been worse thinks her anxiety is also aggravating it. .    cosentix started weekly injections  #3 today.    Foot pain left side due to plantar fasciitis .  Had an injection several months ago which helped transiently.    She is dismayed that she is having chronic pain which has affecte her overall health  Weight etc.    Outpatient Medications Prior to Visit  Medication Sig Dispense Refill  . aspirin 81 MG tablet Take 81 mg by mouth daily.      . calcium-vitamin D (OSCAL WITH D) 250-125 MG-UNIT tablet Take 1 tablet by mouth daily.    . celecoxib (CELEBREX) 200 MG capsule Take 1 capsule (200 mg total) by mouth 2 (two) times daily. 180 capsule 3  . cholecalciferol (VITAMIN D) 1000 UNITS tablet Take 1,000 Units by mouth daily.      . diclofenac sodium (VOLTAREN) 1 % GEL Apply topically 4 (four) times daily.    Marland Kitchen FLUoxetine (PROZAC) 20 MG capsule     . Lactobacillus-Inulin (CULTURELLE DIGESTIVE HEALTH PO) Take 1 tablet by mouth daily.     . Omega-3 Fatty Acids (FISH OIL) 1200 MG CAPS Take 1,200 mg by mouth daily.     Vladimir Faster Glycol-Propyl Glycol (SYSTANE ULTRA) 0.4-0.3 % SOLN Place 1 drop into both eyes daily as needed (for dry eyes).    . Secukinumab (COSENTYX) 150 MG/ML SOSY Inject into the skin every 30 (thirty) days.     Marland Kitchen VYVANSE 50 MG capsule Take 1 tablet by mouth every morning.    . zolpidem (AMBIEN) 10 MG tablet Take 5 mg by mouth at bedtime.     Marland Kitchen oxyCODONE-acetaminophen (PERCOCET/ROXICET) 5-325 MG tablet Take 1-2 tablets by mouth daily as needed for moderate pain. 45 tablet 0  . oxyCODONE-acetaminophen  (PERCOCET/ROXICET) 5-325 MG tablet Take 1-2 tablets by mouth daily as needed for moderate pain. 45 tablet 0  . predniSONE (STERAPRED UNI-PAK 48 TAB) 10 MG (48) TBPK tablet Take by mouth as needed.     No facility-administered medications prior to visit.     Review of Systems;  Patient denies headache, fevers, malaise, unintentional weight loss, skin rash, eye pain, sinus congestion and sinus pain, sore throat, dysphagia,  hemoptysis , cough, dyspnea, wheezing, chest pain, palpitations, orthopnea, edema, abdominal pain, nausea, melena, diarrhea, constipation, flank pain, dysuria, hematuria, urinary  Frequency, nocturia, numbness, tingling, seizures,  Focal weakness, Loss of consciousness,  Tremor, insomnia, depression, anxiety, and suicidal ideation.      Objective:  BP 112/60 (BP Location: Left Arm, Patient Position: Sitting, Cuff Size: Normal)   Pulse 82   Temp 99 F (37.2 C) (Oral)   Resp 16   Ht 5\' 7"  (1.702 m)   Wt 179 lb 12.8 oz (81.6 kg)   SpO2 98%   BMI 28.16 kg/m   BP Readings from Last 3 Encounters:  04/09/18 112/60  12/31/17 120/60  12/14/17 105/75    Wt Readings from Last 3 Encounters:  04/09/18 179 lb 12.8 oz (81.6 kg)  12/31/17 176 lb 12.8 oz (80.2 kg)  11/07/17 177 lb 12 oz (80.6 kg)  General appearance: alert, cooperative and appears stated age Ears: normal TM's and external ear canals both ears Throat: lips, mucosa, and tongue normal; teeth and gums normal Neck: no adenopathy, no carotid bruit, supple, symmetrical, trachea midline and thyroid not enlarged, symmetric, no tenderness/mass/nodules Back: symmetric, no curvature. ROM normal. No CVA tenderness. Lungs: clear to auscultation bilaterally Heart: regular rate and rhythm, S1, S2 normal, no murmur, click, rub or gallop Abdomen: soft, non-tender; bowel sounds normal; no masses,  no organomegaly Pulses: 2+ and symmetric Skin: Skin color, texture, turgor normal. No rashes or lesions Lymph nodes:  Cervical, supraclavicular, and axillary nodes normal.  No results found for: HGBA1C  Lab Results  Component Value Date   CREATININE 0.90 12/14/2017   CREATININE 0.65 09/11/2017   CREATININE 0.68 10/27/2016    Lab Results  Component Value Date   WBC 5.5 10/27/2016   HGB 12.6 10/27/2016   HCT 37.0 10/27/2016   PLT 254.0 10/27/2016   GLUCOSE 106 (H) 09/11/2017   CHOL 373 (H) 09/11/2017   TRIG 83.0 09/11/2017   HDL 68.20 09/11/2017   LDLDIRECT 252.3 06/12/2012   LDLCALC 289 (H) 09/11/2017   ALT 16 09/11/2017   AST 18 09/11/2017   NA 137 09/11/2017   K 4.0 09/11/2017   CL 100 09/11/2017   CREATININE 0.90 12/14/2017   BUN 22 09/11/2017   CO2 32 09/11/2017   TSH 1.620 10/27/2016    Ct Coronary Morph W/cta Cor W/score W/ca W/cm &/or Wo/cm  Addendum Date: 12/14/2017   ADDENDUM REPORT: 12/14/2017 15:57 CLINICAL DATA:  Chest pain EXAM: Cardiac CTA MEDICATIONS: Sub lingual nitro. 4mg  x 2 and lopressor 5mg  IV TECHNIQUE: The patient was scanned on a Siemens 497 slice scanner. Gantry rotation speed was 240 msecs. Collimation was 0.6 mm. A 100 kV prospective scan was triggered in the ascending thoracic aorta at 35-75% of the R-R interval. Average HR during the scan was 60 bpm. The 3D data set was interpreted on a dedicated work station using MPR, MIP and VRT modes. A total of 80cc of contrast was used. FINDINGS: Non-cardiac: See separate report from [facility]. Calcium Score: Coronary artery calcium score 0 Agatston units. Coronary Arteries: Right dominant with no anomalies LM: No plaque or stenosis. LAD system: No plaque or stenosis. Circumflex system: Large PLOM, small AV LCx.  No plaque or stenosis. RCA system: No plaque or stenosis. IMPRESSION: 1. Coronary artery calcium score 0 Agatston units. This suggests low risk for future cardiac events. 2.  No obstructive coronary disease noted. Dalton Mclean Electronically Signed   By: Loralie Champagne M.D.   On: 12/14/2017 15:57   Result  Date: 12/14/2017 EXAM: OVER-READ INTERPRETATION  CT CHEST The following report is an over-read performed by radiologist Dr. Rolm Baptise of [facility], [place] on 12/14/2017. This over-read does not include interpretation of cardiac or coronary anatomy or pathology. The coronary CTA interpretation by the cardiologist is attached. COMPARISON:  None. FINDINGS: Vascular: Heart is normal size. Visualized aorta normal caliber. Scattered descending aortic calcifications. Mediastinum/Nodes: No adenopathy in the lower mediastinum or hila. Lungs/Pleura: Dependent atelectasis in the lower lobes. No effusions. Upper Abdomen: Imaging into the upper abdomen shows no acute findings. Musculoskeletal: Chest wall soft tissues are unremarkable. No acute bony abnormality. IMPRESSION: Dependent atelectasis in the lower lobes. Scattered descending aortic calcifications. No acute findings. Electronically Signed: By: Rolm Baptise M.D. On: 12/14/2017 10:50    Assessment & Plan:   Problem List Items Addressed This Visit    Spondylolisthesis of  lumbar region    With persistent low back pain not relieved with NSAIDs or recent ESI. .   Management of her pain requires  refill on narcotics for twice daily  use . Marland Kitchen Refill history confirmed via [place] Controlled Substance database, accessed by me today..she has had no narcotics sent in by any other  Provider. .refills for 90 days given.            I am having Madeline Young "[name]" start on DULoxetine. I am also having her maintain her zolpidem, aspirin, cholecalciferol, Fish Oil, Lactobacillus-Inulin (CULTURELLE DIGESTIVE HEALTH PO), Polyethyl Glycol-Propyl Glycol, diclofenac sodium, VYVANSE, predniSONE, calcium-vitamin D, celecoxib, FLUoxetine, Secukinumab, oxyCODONE-acetaminophen, and oxyCODONE-acetaminophen.  Meds ordered this encounter  Medications  . DULoxetine (CYMBALTA) 30 MG capsule    Sig: Take 1 capsule (30 mg total) by mouth daily.    Dispense:  30 capsule     Refill:  3  . DISCONTD: oxyCODONE-acetaminophen (PERCOCET/ROXICET) 5-325 MG tablet    Sig: Take 1-2 tablets by mouth daily as needed for moderate pain.    Dispense:  45 tablet    Refill:  0    May refill on or after  July 12 , 2019  . oxyCODONE-acetaminophen (PERCOCET/ROXICET) 5-325 MG tablet    Sig: Take 1-2 tablets by mouth daily as needed for moderate pain.    Dispense:  45 tablet    Refill:  0    May refill on or after  August 12 , 2019  . oxyCODONE-acetaminophen (PERCOCET/ROXICET) 5-325 MG tablet    Sig: Take 1-2 tablets by mouth daily as needed for moderate pain.    Dispense:  45 tablet    Refill:  0    May refill on or after   September 12 , 2019    A total of 25 minutes of face to face time was spent with patient more than half of which was spent in counselling about the above mentioned conditions  and coordination of care   Medications Discontinued During This Encounter  Medication Reason  . oxyCODONE-acetaminophen (PERCOCET/ROXICET) 5-325 MG tablet Reorder  . oxyCODONE-acetaminophen (PERCOCET/ROXICET) 5-325 MG tablet Reorder  . oxyCODONE-acetaminophen (PERCOCET/ROXICET) 5-325 MG tablet Reorder    Follow-up: No follow-ups on file.   Crecencio Mc, MD

## 2018-04-10 ENCOUNTER — Telehealth: Payer: Self-pay | Admitting: Internal Medicine

## 2018-04-10 NOTE — Telephone Encounter (Signed)
Pharmacy notified,

## 2018-04-10 NOTE — Telephone Encounter (Signed)
Pharmacy called with question concerning Cymbalta  And Prozac stating not usually given together. Needs clarification ok to fill Cymbalta? Combo may increase Duloxetine levels, risk of SIADH, hyponatremia, serotonin syndrome, CNS depression, addictive effect.

## 2018-04-10 NOTE — Telephone Encounter (Signed)
I am aware.  She is taking a low dose of prozac , 20 mg.  Ok to fill

## 2018-04-12 NOTE — Assessment & Plan Note (Signed)
Adding cymbalta for concurrent pain management

## 2018-04-12 NOTE — Assessment & Plan Note (Signed)
With persistent low back pain not relieved with NSAIDs or recent ESI. .   Management of her pain requires  refill on narcotics for twice daily  use . Marland Kitchen Refill history confirmed via [place] Controlled Substance database, accessed by me today..she has had no narcotics sent in by any other  Provider. .refills for 90 days given.

## 2018-04-22 DIAGNOSIS — R69 Illness, unspecified: Secondary | ICD-10-CM | POA: Diagnosis not present

## 2018-04-29 DIAGNOSIS — H524 Presbyopia: Secondary | ICD-10-CM | POA: Diagnosis not present

## 2018-05-27 DIAGNOSIS — M150 Primary generalized (osteo)arthritis: Secondary | ICD-10-CM | POA: Diagnosis not present

## 2018-05-27 DIAGNOSIS — L4050 Arthropathic psoriasis, unspecified: Secondary | ICD-10-CM | POA: Diagnosis not present

## 2018-05-27 DIAGNOSIS — Z79899 Other long term (current) drug therapy: Secondary | ICD-10-CM | POA: Diagnosis not present

## 2018-05-27 DIAGNOSIS — M5136 Other intervertebral disc degeneration, lumbar region: Secondary | ICD-10-CM | POA: Diagnosis not present

## 2018-05-27 DIAGNOSIS — M797 Fibromyalgia: Secondary | ICD-10-CM | POA: Diagnosis not present

## 2018-05-28 DIAGNOSIS — R69 Illness, unspecified: Secondary | ICD-10-CM | POA: Diagnosis not present

## 2018-06-19 ENCOUNTER — Ambulatory Visit: Payer: Medicare HMO | Admitting: Podiatry

## 2018-06-20 DIAGNOSIS — R69 Illness, unspecified: Secondary | ICD-10-CM | POA: Diagnosis not present

## 2018-07-08 ENCOUNTER — Ambulatory Visit: Payer: Medicare HMO | Admitting: Podiatry

## 2018-07-10 ENCOUNTER — Encounter: Payer: Self-pay | Admitting: Internal Medicine

## 2018-07-10 ENCOUNTER — Ambulatory Visit (INDEPENDENT_AMBULATORY_CARE_PROVIDER_SITE_OTHER): Payer: Medicare HMO | Admitting: Internal Medicine

## 2018-07-10 ENCOUNTER — Ambulatory Visit (INDEPENDENT_AMBULATORY_CARE_PROVIDER_SITE_OTHER): Payer: Medicare HMO

## 2018-07-10 VITALS — BP 110/64 | HR 81 | Temp 98.3°F | Resp 15 | Ht 64.5 in | Wt 172.4 lb

## 2018-07-10 VITALS — BP 110/64 | HR 81 | Temp 98.3°F | Resp 15 | Ht 64.5 in | Wt 172.6 lb

## 2018-07-10 DIAGNOSIS — Z1239 Encounter for other screening for malignant neoplasm of breast: Secondary | ICD-10-CM | POA: Diagnosis not present

## 2018-07-10 DIAGNOSIS — Z79899 Other long term (current) drug therapy: Secondary | ICD-10-CM

## 2018-07-10 DIAGNOSIS — Z23 Encounter for immunization: Secondary | ICD-10-CM

## 2018-07-10 DIAGNOSIS — M4316 Spondylolisthesis, lumbar region: Secondary | ICD-10-CM

## 2018-07-10 DIAGNOSIS — I6523 Occlusion and stenosis of bilateral carotid arteries: Secondary | ICD-10-CM

## 2018-07-10 DIAGNOSIS — E7849 Other hyperlipidemia: Secondary | ICD-10-CM | POA: Diagnosis not present

## 2018-07-10 DIAGNOSIS — R69 Illness, unspecified: Secondary | ICD-10-CM | POA: Diagnosis not present

## 2018-07-10 DIAGNOSIS — F900 Attention-deficit hyperactivity disorder, predominantly inattentive type: Secondary | ICD-10-CM

## 2018-07-10 DIAGNOSIS — R238 Other skin changes: Secondary | ICD-10-CM

## 2018-07-10 DIAGNOSIS — Z0000 Encounter for general adult medical examination without abnormal findings: Secondary | ICD-10-CM

## 2018-07-10 LAB — COMPREHENSIVE METABOLIC PANEL
ALBUMIN: 4.4 g/dL (ref 3.5–5.2)
ALT: 16 U/L (ref 0–35)
AST: 17 U/L (ref 0–37)
Alkaline Phosphatase: 52 U/L (ref 39–117)
BILIRUBIN TOTAL: 0.6 mg/dL (ref 0.2–1.2)
BUN: 26 mg/dL — AB (ref 6–23)
CO2: 28 mEq/L (ref 19–32)
Calcium: 9.5 mg/dL (ref 8.4–10.5)
Chloride: 100 mEq/L (ref 96–112)
Creatinine, Ser: 0.76 mg/dL (ref 0.40–1.20)
GFR: 81.46 mL/min (ref >60.00)
Glucose, Bld: 96 mg/dL (ref 70–99)
Potassium: 4 mEq/L (ref 3.5–5.1)
Sodium: 136 mEq/L (ref 135–145)
TOTAL PROTEIN: 7.3 g/dL (ref 6.0–8.3)

## 2018-07-10 MED ORDER — OXYCODONE-ACETAMINOPHEN 5-325 MG PO TABS: 1.0000 | ORAL_TABLET | Freq: Every day | ORAL | 0 refills | Status: DC | PRN

## 2018-07-10 NOTE — Progress Notes (Addendum)
Subjective:   Madeline Young is a 62 y.o. female who presents for Medicare Annual (Subsequent) preventive examination.  Review of Systems:  No ROS.  Medicare Wellness Visit. Additional risk factors are reflected in the social history.  Cardiac Risk Factors include: advanced age (>4men, >38 women)     Objective:     Vitals: BP 110/64 (BP Location: Left Arm, Patient Position: Sitting, Cuff Size: Normal)   Pulse 81   Temp 98.3 F (36.8 C) (Oral)   Resp 15   Ht 5' 4.5" (1.638 m)   Wt 172 lb 6.4 oz (78.2 kg)   SpO2 98%   BMI 29.14 kg/m   Body mass index is 29.14 kg/m.  Advanced Directives 07/10/2018 03/17/2016 02/09/2016 02/10/2015  Does Patient Have a Medical Advance Directive? Yes No No No  Type of Paramedic of [place];Living will - - -  Does patient want to make changes to medical advance directive? No - Patient declined - - -  Copy of [place] in Chart? No - copy requested - - -  Would patient like information on creating a medical advance directive? - No - patient declined information Yes - Educational materials given Yes - Scientist, clinical (histocompatibility and immunogenetics) given    Tobacco Social History   Tobacco Use  Smoking Status Former Smoker  . Types: Cigarettes  . Last attempt to quit: 08/21/1971  . Years since quitting: 46.9  Smokeless Tobacco Never Used     Counseling given: Not Answered   Clinical Intake:  Pre-visit preparation completed: Yes  Pain : 0-10 Pain Score: 8 (Traveled by air flight yesterday with little rest and believes this to be a contributing factor. ) Pain Type: Chronic pain Pain Location: Back Pain Orientation: Lower Pain Onset: More than a month ago Pain Relieving Factors: Ice packs, yoga, cosentyx medication. Followed by pcp.  Effect of Pain on Daily Activities: She paces herself. Rest.   Pain Relieving Factors: Ice packs, yoga, cosentyx medication. Followed by pcp.   Nutritional Status: BMI 25 -29  Overweight Diabetes: No  How often do you need to have someone help you when you read instructions, pamphlets, or other written materials from your doctor or pharmacy?: 1 - Never  Interpreter Needed?: No     Past Medical History:  Diagnosis Date  . 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 ADHD (attention deficit hyperactivity disorder)   . Allergy   . Anxiety   . Arthritis   . Cataract    bil cateracts removed  . Complication of anesthesia    first colonoscopy pt states she woke up  . Depression   . GERD (gastroesophageal reflux disease)   . Globus sensation   . Hyperlipidemia   . Insomnia   . Multinodular goiter (nontoxic)   . Psoriatic arthritis ([place])   . Spinal stenosis of lumbar region June 2016   MRI   . Statin intolerance 01/27/2013   Past Surgical History:  Procedure Laterality Date  . BACK SURGERY  02/17/2016   Dr. Jacqulynn Cadet- L4-L5 fusion   . BIOPSY THYROID  2014  . COLONOSCOPY W/ POLYPECTOMY    . EYE SURGERY     bilateral cataract surgery w/ lens implant  . FOOT SURGERY  2006   Right foot , secondary to severe loss of joint [facility])  . TONSILLECTOMY    . UPPER GASTROINTESTINAL ENDOSCOPY  2005   With empiric esophageal dilation   Family History  Problem Relation Age of Onset  .  Heart disease Mother   . Hyperlipidemia Mother   . Coronary artery disease Father 37       CABG in early 62's  . Hyperlipidemia Father   . Epilepsy Grandchild        Severe form  . Colon cancer Neg Hx   . Esophageal cancer Neg Hx   . Rectal cancer Neg Hx   . Stomach cancer Neg Hx    Social History   Socioeconomic History  . Marital status: Married    Spouse name: Not on file  . Number of children: 2  . Years of education: Not on file  . Highest education level: Not on file  Occupational History  . Occupation: disability  Social Needs  . Financial resource strain: Not hard at all  . Food insecurity:    Worry: Never true    Inability: Never true  . Transportation needs:      Medical: No    Non-medical: No  Tobacco Use  . Smoking status: Former Smoker    Types: Cigarettes    Last attempt to quit: 08/21/1971    Years since quitting: 46.9  . Smokeless tobacco: Never Used  Substance and Sexual Activity  . Alcohol use: Yes    Alcohol/week: 7.0 standard drinks    Types: 7 Glasses of wine per week  . Drug use: No  . Sexual activity: Not on file  Lifestyle  . Physical activity:    Days per week: 2 days    Minutes per session: 50 min  . Stress: Not on file  Relationships  . Social connections:    Talks on phone: Not on file    Gets together: Not on file    Attends religious service: Not on file    Active member of club or organization: Not on file    Attends meetings of clubs or organizations: Not on file    Relationship status: Married  Other Topics Concern  . Not on file  Social History Narrative   Married, retired Therapist, sports   2 daughters some grandchildren   2 caffeinated drinks daily   1 alcoholic beverage daily   No tobacco   04/29/2015    Outpatient Encounter Medications as of 07/10/2018  Medication Sig  . aspirin 81 MG tablet Take 81 mg by mouth daily.    . calcium-vitamin D (OSCAL WITH D) 250-125 MG-UNIT tablet Take 1 tablet by mouth daily.  . cholecalciferol (VITAMIN D) 1000 UNITS tablet Take 1,000 Units by mouth daily.    . diclofenac sodium (VOLTAREN) 1 % GEL Apply topically 4 (four) times daily.  . DULoxetine (CYMBALTA) 30 MG capsule Take 1 capsule (30 mg total) by mouth daily. (Patient not taking: Reported on 07/10/2018)  . FLUoxetine (PROZAC) 20 MG capsule   . Lactobacillus-Inulin (CULTURELLE DIGESTIVE HEALTH PO) Take 1 tablet by mouth daily.   . Multiple Vitamin (MULTIVITAMIN) tablet Take 1 tablet by mouth daily.  . Omega-3 Fatty Acids (FISH OIL) 1200 MG CAPS Take 1,200 mg by mouth daily.   Marland Kitchen oxyCODONE-acetaminophen (PERCOCET/ROXICET) 5-325 MG tablet Take 1-2 tablets by mouth daily as needed for moderate pain.  Vladimir Faster Glycol-Propyl  Glycol (SYSTANE ULTRA) 0.4-0.3 % SOLN Place 1 drop into both eyes daily as needed (for dry eyes).  . predniSONE (STERAPRED UNI-PAK 48 TAB) 10 MG (48) TBPK tablet Take by mouth as needed.  . Probiotic Product (PROBIOTIC PO) Take 1 tablet by mouth daily.  . Secukinumab (COSENTYX) 150 MG/ML SOSY Inject into the skin every  30 (thirty) days.   Marland Kitchen VYVANSE 50 MG capsule Take 1 tablet by mouth every morning.  . zolpidem (AMBIEN) 10 MG tablet Take 5 mg by mouth at bedtime.   . [DISCONTINUED] celecoxib (CELEBREX) 200 MG capsule Take 1 capsule (200 mg total) by mouth 2 (two) times daily.  . [DISCONTINUED] oxyCODONE-acetaminophen (PERCOCET/ROXICET) 5-325 MG tablet Take 1-2 tablets by mouth daily as needed for moderate pain.   No facility-administered encounter medications on file as of 07/10/2018.     Activities of Daily Living In your present state of health, do you have any difficulty performing the following activities: 07/10/2018  Hearing? N  Vision? N  Difficulty concentrating or making decisions? N  Walking or climbing stairs? Y  Comment Back pain, chronic  Dressing or bathing? N  Doing errands, shopping? N  Preparing Food and eating ? N  Using the Toilet? N  In the past six months, have you accidently leaked urine? Y  Comment Managed with a daily liner. Declined follow up.  Do you have problems with loss of bowel control? Y  Comment Managed with as daily liner. Declined follow up.   Managing your Medications? N  Managing your Finances? Y  Comment Husband manages  Housekeeping or managing your Housekeeping? Y  Comment Paces herself due to pain  Some recent data might be hidden    Patient Care Team: Crecencio Mc, MD as PCP - General (Internal Medicine)    Assessment:   This is a routine wellness examination for Madeline Young.  The goal of the wellness visit is to assist the patient how to close the gaps in care and create a preventative care plan for the patient.   The roster of all  physicians providing medical care to patient is listed in the Snapshot section of the chart.  Osteoarthritis. Taking calcium VIT D as appropriate/Osteoporosis risk reviewed.    Safety issues reviewed; Smoke and carbon monoxide detectors in the home. No firearms or firearms locked in a safe within the home. Wears seatbelts when driving or riding with others. No violence in the home.  They do not have excessive sun exposure.  Discussed the need for sun protection: hats, long sleeves and the use of sunscreen if there is significant sun exposure.  Patient is alert, normal appearance, oriented to person/place/and time. Correctly identified the president of the [place] and recalls of 3/3 words. Performs simple calculations and can read correct time from watch face. Displays appropriate judgement.  No new identified risk were noted.  No failures at ADL's or IADL's.    BMI- discussed the importance of a healthy diet, water intake and the benefits of aerobic exercise. Educational material provided.   24 hour diet recall: Low cholesterol diet.  Dental- every 6  months.  Eye- Visual acuity not assessed per patient preference since they have regular follow up with the ophthalmologist.  Wears corrective lenses.  Sleep patterns- Sleeps well at night; taking ambien as directed.   Influenza discussed; deferred for follow up with pcp due to possible contraindication.   Exercise Activities and Dietary recommendations Current Exercise Habits: Home exercise routine, Type of exercise: walking;yoga, Time (Minutes): 45, Frequency (Times/Week): 2, Weekly Exercise (Minutes/Week): 90, Intensity: Mild  Goals    . Maintain Healthy Lifestyle       Fall Risk Fall Risk  07/10/2018 10/27/2016  Falls in the past year? No Yes  Number falls in past yr: - 2 or more  Injury with Fall? - No   Depression  Screen PHQ 2/9 Scores 07/10/2018 12/31/2017 10/27/2016  PHQ - 2 Score 0 2 3  PHQ- 9 Score - 4 7     Cognitive  Function MMSE - Mini Mental State Exam 07/10/2018  Orientation to time 5  Orientation to Place 5  Registration 3  Attention/ Calculation 5  Recall 3  Language- name 2 objects 2  Language- repeat 1  Language- follow 3 step command 3  Language- read & follow direction 1  Write a sentence 1  Copy design 1  Total score 30        Immunization History  Administered Date(s) Administered  . Influenza Split 08/21/2011, 08/09/2012  . Influenza, High Dose Seasonal PF 07/10/2018  . Influenza-Unspecified 07/10/2016, 09/14/2017  . Pneumococcal Conjugate-13 07/17/2014  . Pneumococcal Polysaccharide-23 08/21/2011, 07/27/2016  . Tdap 08/09/2012  . Zoster 09/09/2016   Screening Tests Health Maintenance  Topic Date Due  . INFLUENZA VACCINE  05/09/2018  . MAMMOGRAM  08/17/2018  . PAP SMEAR  01/07/2019  . COLONOSCOPY  05/03/2020  . TETANUS/TDAP  08/09/2022  . Hepatitis C Screening  Completed  . HIV Screening  Completed      Plan:   End of life planning; Advance aging; Advanced directives discussed. Copy of current HCPOA/Living Will requested.    I have personally reviewed and noted the following in the patient's chart:   . Medical and social history . Use of alcohol, tobacco or illicit drugs  . Current medications and supplements . Functional ability and status . Nutritional status . Physical activity . Advanced directives . List of other physicians . Hospitalizations, surgeries, and ER visits in previous 12 months . Vitals . Screenings to include cognitive, depression, and falls . Referrals and appointments  In addition, I have reviewed and discussed with patient certain preventive protocols, quality metrics, and best practice recommendations. A written personalized care plan for preventive services as well as general preventive health recommendations were provided to patient.     OBrien-Blaney, Brae Schaafsma L, LPN  33/03/1223   I have reviewed the above information and agree  with above.   Deborra Medina, MD

## 2018-07-10 NOTE — Progress Notes (Signed)
Subjective:  Patient ID: Madeline Young, female    DOB: May 31, 1956  Age: 62 y.o. MRN: [id number]  CC: The primary encounter diagnosis was Papule of skin. Diagnoses of Breast cancer screening, Long-term use of high-risk medication, Encounter for immunization, Attention deficit hyperactivity disorder (ADHD), predominantly inattentive type, Carotid artery stenosis, asymptomatic, bilateral, Familial hyperlipidemia, and Spondylolisthesis of lumbar region were also pertinent to this visit.  HPI [name] presents for 3 MONTH FOLLOW  UP on back pain managed with oxycodone and celebrex.  cymbalta was not started due to pharmacist's warning about interaction with prozac, prescribed by her psychiatrist Dr Clovis Pu.   Her pain is slightly worse since she just spent the last 2 weeks in [place] with grandchildren and was more active than usual.  She is limiting her use of percocet to 2 daily .  Refill history confirmed via  Controlled Substance databas, accessed by me today..  Wants to see Dermatology for recurrent ulcerations on face.  Present as papules  That are mildly painful, umbilicated, will occasionally bleed spontaneously ,  Last for several weeks ,  Then Resolve spontaneously  And are scarring her face.  Her treatment for psoriatic arthritis is improving with initiation of Cosentix      Outpatient Medications Prior to Visit  Medication Sig Dispense Refill  . aspirin 81 MG tablet Take 81 mg by mouth daily.      . calcium-vitamin D (OSCAL WITH D) 250-125 MG-UNIT tablet Take 1 tablet by mouth daily.    . cholecalciferol (VITAMIN D) 1000 UNITS tablet Take 1,000 Units by mouth daily.      . diclofenac sodium (VOLTAREN) 1 % GEL Apply topically 4 (four) times daily.    Marland Kitchen FLUoxetine (PROZAC) 20 MG capsule     . Lactobacillus-Inulin (CULTURELLE DIGESTIVE HEALTH PO) Take 1 tablet by mouth daily.     . Multiple Vitamin (MULTIVITAMIN) tablet Take 1 tablet by mouth daily.    . Omega-3 Fatty  Acids (FISH OIL) 1200 MG CAPS Take 1,200 mg by mouth daily.     Vladimir Faster Glycol-Propyl Glycol (SYSTANE ULTRA) 0.4-0.3 % SOLN Place 1 drop into both eyes daily as needed (for dry eyes).    . predniSONE (STERAPRED UNI-PAK 48 TAB) 10 MG (48) TBPK tablet Take by mouth as needed.    . Probiotic Product (PROBIOTIC PO) Take 1 tablet by mouth daily.    . Secukinumab (COSENTYX) 150 MG/ML SOSY Inject into the skin every 30 (thirty) days.     Marland Kitchen VYVANSE 50 MG capsule Take 1 tablet by mouth every morning.    . zolpidem (AMBIEN) 10 MG tablet Take 5 mg by mouth at bedtime.     Marland Kitchen oxyCODONE-acetaminophen (PERCOCET/ROXICET) 5-325 MG tablet Take 1-2 tablets by mouth daily as needed for moderate pain. 45 tablet 0  . oxyCODONE-acetaminophen (PERCOCET/ROXICET) 5-325 MG tablet Take 1-2 tablets by mouth daily as needed for moderate pain. 45 tablet 0  . DULoxetine (CYMBALTA) 30 MG capsule Take 1 capsule (30 mg total) by mouth daily. (Patient not taking: Reported on 07/10/2018) 30 capsule 3   No facility-administered medications prior to visit.     Review of Systems;  Patient denies headache, fevers, malaise, unintentional weight loss, skin rash, eye pain, sinus congestion and sinus pain, sore throat, dysphagia,  hemoptysis , cough, dyspnea, wheezing, chest pain, palpitations, orthopnea, edema, abdominal pain, nausea, melena, diarrhea, constipation, flank pain, dysuria, hematuria, urinary  Frequency, nocturia, numbness, tingling, seizures,  Focal weakness, Loss of consciousness,  Tremor,  insomnia, depression, anxiety, and suicidal ideation.      Objective:  BP 110/64 (BP Location: Left Arm, Patient Position: Sitting, Cuff Size: Normal)   Pulse 81   Temp 98.3 F (36.8 C) (Oral)   Resp 15   Ht 5' 4.5" (1.638 m)   Wt 172 lb 9.6 oz (78.3 kg)   SpO2 98%   BMI 29.17 kg/m   BP Readings from Last 3 Encounters:  07/10/18 110/64  07/10/18 110/64  04/09/18 112/60    Wt Readings from Last 3 Encounters:  07/10/18  172 lb 9.6 oz (78.3 kg)  07/10/18 172 lb 6.4 oz (78.2 kg)  04/09/18 179 lb 12.8 oz (81.6 kg)    General appearance: alert, cooperative and appears stated age Ears: normal TM's and external ear canals both ears Throat: lips, mucosa, and tongue normal; teeth and gums normal Neck: no adenopathy, no carotid bruit, supple, symmetrical, trachea midline and thyroid not enlarged, symmetric, no tenderness/mass/nodules Back: symmetric, no curvature. ROM normal. No CVA tenderness. Lungs: clear to auscultation bilaterally Heart: regular rate and rhythm, S1, S2 normal, no murmur, click, rub or gallop Abdomen: soft, non-tender; bowel sounds normal; no masses,  no organomegaly Pulses: 2+ and symmetric Skin: 3 mm flesh colored. papule above right eyebrow. Skin color, texture, turgor normal.  Lymph nodes: Cervical, supraclavicular, and axillary nodes normal.  No results found for: HGBA1C  Lab Results  Component Value Date   CREATININE 0.76 07/10/2018   CREATININE 0.90 12/14/2017   CREATININE 0.65 09/11/2017    Lab Results  Component Value Date   WBC 5.5 10/27/2016   HGB 12.6 10/27/2016   HCT 37.0 10/27/2016   PLT 254.0 10/27/2016   GLUCOSE 96 07/10/2018   CHOL 373 (H) 09/11/2017   TRIG 83.0 09/11/2017   HDL 68.20 09/11/2017   LDLDIRECT 252.3 06/12/2012   LDLCALC 289 (H) 09/11/2017   ALT 16 07/10/2018   AST 17 07/10/2018   NA 136 07/10/2018   K 4.0 07/10/2018   CL 100 07/10/2018   CREATININE 0.76 07/10/2018   BUN 26 (H) 07/10/2018   CO2 28 07/10/2018   TSH 1.620 10/27/2016    Ct Coronary Morph W/cta Cor W/score W/ca W/cm &/or Wo/cm  Addendum Date: 12/14/2017   ADDENDUM REPORT: 12/14/2017 15:57 CLINICAL DATA:  Chest pain EXAM: Cardiac CTA MEDICATIONS: Sub lingual nitro. 4mg  x 2 and lopressor 5mg  IV TECHNIQUE: The patient was scanned on a Siemens 409 slice scanner. Gantry rotation speed was 240 msecs. Collimation was 0.6 mm. A 100 kV prospective scan was triggered in the ascending  thoracic aorta at 35-75% of the R-R interval. Average HR during the scan was 60 bpm. The 3D data set was interpreted on a dedicated work station using MPR, MIP and VRT modes. A total of 80cc of contrast was used. FINDINGS: Non-cardiac: See separate report from [facility]. Calcium Score: Coronary artery calcium score 0 Agatston units. Coronary Arteries: Right dominant with no anomalies LM: No plaque or stenosis. LAD system: No plaque or stenosis. Circumflex system: Large PLOM, small AV LCx.  No plaque or stenosis. RCA system: No plaque or stenosis. IMPRESSION: 1. Coronary artery calcium score 0 Agatston units. This suggests low risk for future cardiac events. 2.  No obstructive coronary disease noted. Dalton Mclean Electronically Signed   By: Loralie Champagne M.D.   On: 12/14/2017 15:57   Result Date: 12/14/2017 EXAM: OVER-READ INTERPRETATION  CT CHEST The following report is an over-read performed by radiologist Dr. Rolm Baptise of [facility]  on 12/14/2017. This over-read does not include interpretation of cardiac or coronary anatomy or pathology. The coronary CTA interpretation by the cardiologist is attached. COMPARISON:  None. FINDINGS: Vascular: Heart is normal size. Visualized aorta normal caliber. Scattered descending aortic calcifications. Mediastinum/Nodes: No adenopathy in the lower mediastinum or hila. Lungs/Pleura: Dependent atelectasis in the lower lobes. No effusions. Upper Abdomen: Imaging into the upper abdomen shows no acute findings. Musculoskeletal: Chest wall soft tissues are unremarkable. No acute bony abnormality. IMPRESSION: Dependent atelectasis in the lower lobes. Scattered descending aortic calcifications. No acute findings. Electronically Signed: By: Rolm Baptise M.D. On: 12/14/2017 10:50    Assessment & Plan:   Problem List Items Addressed This Visit    ADD (attention deficit disorder)    Managed with Vyvanse,  Prescribed by Cottle.       Carotid artery  stenosis, asymptomatic, bilateral    No progression in ten years by most recent doppler in 2018.  continue asa daily       Familial hyperlipidemia    No longer managed with Repatha ,  Has  statin intolerance.  Calcium score was zero on cardiac CT       Spondylolisthesis of lumbar region    With persistent low back pain not relieved with NSAIDs or recent ESI. .   Management of her pain requires  refill on narcotics for twice daily  use . Marland Kitchen Refill history confirmed via [place] Controlled Substance database, accessed by me today..she has had no narcotics sent in by any other  Provider. .refills for 90 days sent electronically          Other Visit Diagnoses    Papule of skin    -  Primary   Relevant Orders   Ambulatory referral to Dermatology   Breast cancer screening       Relevant Orders   MM 3D SCREEN BREAST BILATERAL   Long-term use of high-risk medication       Relevant Orders   Comprehensive metabolic panel (Completed)   Encounter for immunization       Relevant Orders   Flu vaccine HIGH DOSE PF (Completed)     A total of 25 minutes of face to face time was spent with patient more than half of which was spent in counselling about the above mentioned conditions  and coordination of care   I am having Keya D. Segall "[name]" maintain her zolpidem, aspirin, cholecalciferol, Fish Oil, Lactobacillus-Inulin (CULTURELLE DIGESTIVE HEALTH PO), Polyethyl Glycol-Propyl Glycol, diclofenac sodium, VYVANSE, predniSONE, calcium-vitamin D, FLUoxetine, Secukinumab, DULoxetine, Probiotic Product (PROBIOTIC PO), multivitamin, oxyCODONE-acetaminophen, oxyCODONE-acetaminophen, and oxyCODONE-acetaminophen.  Meds ordered this encounter  Medications  . DISCONTD: oxyCODONE-acetaminophen (PERCOCET/ROXICET) 5-325 MG tablet    Sig: Take 1-2 tablets by mouth daily as needed for moderate pain.    Dispense:  45 tablet    Refill:  0    May refill on or after   October  12 , 2019  . oxyCODONE-acetaminophen  (PERCOCET/ROXICET) 5-325 MG tablet    Sig: Take 1-2 tablets by mouth daily as needed for moderate pain.    Dispense:  45 tablet    Refill:  0    May refill on or after Nov 11 , 2019  . oxyCODONE-acetaminophen (PERCOCET/ROXICET) 5-325 MG tablet    Sig: Take 1-2 tablets by mouth daily as needed for moderate pain.    Dispense:  45 tablet    Refill:  0    May refill on or after   Dec 11 , 2019  .  oxyCODONE-acetaminophen (PERCOCET/ROXICET) 5-325 MG tablet    Sig: Take 1-2 tablets by mouth daily as needed for moderate pain.    Dispense:  45 tablet    Refill:  0    May refill on or after   July 20, 2018    Medications Discontinued During This Encounter  Medication Reason  . oxyCODONE-acetaminophen (PERCOCET/ROXICET) 5-325 MG tablet Reorder  . oxyCODONE-acetaminophen (PERCOCET/ROXICET) 5-325 MG tablet Reorder  . oxyCODONE-acetaminophen (PERCOCET/ROXICET) 5-325 MG tablet     Follow-up: Return in about 3 months (around 10/10/2018).   Crecencio Mc, MD

## 2018-07-10 NOTE — Patient Instructions (Addendum)
  Madeline Young , Thank you for taking time to come for your Medicare Wellness Visit. I appreciate your ongoing commitment to your health goals. Please review the following plan we discussed and let me know if I can assist you in the future.   Follow up as needed.    Bring a copy of your [place] and/or Living Will to be scanned into chart.  Have a great day!  These are the goals we discussed: Goals    . Maintain Healthy Lifestyle       This is a list of the screening recommended for you and due dates:  Health Maintenance  Topic Date Due  . Flu Shot  05/09/2018  . Mammogram  08/17/2018  . Pap Smear  01/07/2019  . Colon Cancer Screening  05/03/2020  . Tetanus Vaccine  08/09/2022  .  Hepatitis C: One time screening is recommended by Center for Disease Control  (CDC) for  adults born from [birth year] through 1967.   Completed  . HIV Screening  Completed

## 2018-07-10 NOTE — Patient Instructions (Addendum)
Take 2000 Ius Vit D 3 daily from November to April    Refill of pain meds for 3 months have been sent to your pharmacy for 3 months   Check with Dr Clovis Pu about cymbalta plus/minus prozac    You had your TDaP vaccine in 2013 so you are up to date   Your annual mammogram has been ordered.  You are encouraged (required) to call to make your appointment at [facility]   Referral to [facility] made

## 2018-07-11 MED ORDER — OXYCODONE-ACETAMINOPHEN 5-325 MG PO TABS: 1.0000 | ORAL_TABLET | Freq: Every day | ORAL | 0 refills | Status: DC | PRN

## 2018-07-11 NOTE — Assessment & Plan Note (Signed)
Stable, managed with prozac by Dr Clovis Pu .  Advised to discuss change to cymbalta or co administration given her chronic pain syndrome

## 2018-07-11 NOTE — Assessment & Plan Note (Signed)
Managed with Vyvanse,  Prescribed by Cottle.

## 2018-07-11 NOTE — Assessment & Plan Note (Addendum)
No longer managed with Repatha ,  Has  statin intolerance.  Calcium score was zero on cardiac CT

## 2018-07-11 NOTE — Assessment & Plan Note (Signed)
With persistent low back pain not relieved with NSAIDs or recent ESI. .   Management of her pain requires  refill on narcotics for twice daily  use . Marland Kitchen Refill history confirmed via [place] Controlled Substance database, accessed by me today..she has had no narcotics sent in by any other  Provider. .refills for 90 days sent electronically

## 2018-07-11 NOTE — Assessment & Plan Note (Signed)
No progression in ten years by most recent doppler in 2018.  continue asa daily

## 2018-07-16 DIAGNOSIS — J069 Acute upper respiratory infection, unspecified: Secondary | ICD-10-CM | POA: Diagnosis not present

## 2018-07-16 DIAGNOSIS — Z79899 Other long term (current) drug therapy: Secondary | ICD-10-CM | POA: Diagnosis not present

## 2018-07-16 DIAGNOSIS — M5136 Other intervertebral disc degeneration, lumbar region: Secondary | ICD-10-CM | POA: Diagnosis not present

## 2018-07-16 DIAGNOSIS — M797 Fibromyalgia: Secondary | ICD-10-CM | POA: Diagnosis not present

## 2018-07-16 DIAGNOSIS — M150 Primary generalized (osteo)arthritis: Secondary | ICD-10-CM | POA: Diagnosis not present

## 2018-07-16 DIAGNOSIS — L4050 Arthropathic psoriasis, unspecified: Secondary | ICD-10-CM | POA: Diagnosis not present

## 2018-07-17 ENCOUNTER — Other Ambulatory Visit: Payer: Self-pay

## 2018-07-17 MED ORDER — FLUOXETINE HCL 20 MG PO CAPS: 20.0000 mg | ORAL_CAPSULE | Freq: Every day | ORAL | 1 refills | Status: DC

## 2018-08-13 DIAGNOSIS — M545 Low back pain: Secondary | ICD-10-CM | POA: Diagnosis not present

## 2018-08-13 DIAGNOSIS — M4150 Other secondary scoliosis, site unspecified: Secondary | ICD-10-CM | POA: Diagnosis not present

## 2018-08-13 DIAGNOSIS — S32009K Unspecified fracture of unspecified lumbar vertebra, subsequent encounter for fracture with nonunion: Secondary | ICD-10-CM | POA: Diagnosis not present

## 2018-08-13 DIAGNOSIS — R030 Elevated blood-pressure reading, without diagnosis of hypertension: Secondary | ICD-10-CM | POA: Diagnosis not present

## 2018-08-13 DIAGNOSIS — Z6829 Body mass index (BMI) 29.0-29.9, adult: Secondary | ICD-10-CM | POA: Diagnosis not present

## 2018-08-13 DIAGNOSIS — M419 Scoliosis, unspecified: Secondary | ICD-10-CM | POA: Insufficient documentation

## 2018-10-11 ENCOUNTER — Encounter: Payer: Self-pay | Admitting: Emergency Medicine

## 2018-10-11 DIAGNOSIS — F419 Anxiety disorder, unspecified: Secondary | ICD-10-CM | POA: Insufficient documentation

## 2018-10-14 ENCOUNTER — Ambulatory Visit (INDEPENDENT_AMBULATORY_CARE_PROVIDER_SITE_OTHER): Payer: Medicare HMO | Admitting: Internal Medicine

## 2018-10-14 ENCOUNTER — Encounter: Payer: Self-pay | Admitting: Internal Medicine

## 2018-10-14 ENCOUNTER — Telehealth: Payer: Self-pay | Admitting: Internal Medicine

## 2018-10-14 VITALS — BP 112/58 | HR 78 | Temp 98.1°F | Resp 14 | Ht 64.5 in | Wt 177.2 lb

## 2018-10-14 DIAGNOSIS — K219 Gastro-esophageal reflux disease without esophagitis: Secondary | ICD-10-CM | POA: Diagnosis not present

## 2018-10-14 DIAGNOSIS — F900 Attention-deficit hyperactivity disorder, predominantly inattentive type: Secondary | ICD-10-CM

## 2018-10-14 DIAGNOSIS — E042 Nontoxic multinodular goiter: Secondary | ICD-10-CM | POA: Diagnosis not present

## 2018-10-14 DIAGNOSIS — R0989 Other specified symptoms and signs involving the circulatory and respiratory systems: Secondary | ICD-10-CM | POA: Diagnosis not present

## 2018-10-14 DIAGNOSIS — R69 Illness, unspecified: Secondary | ICD-10-CM | POA: Diagnosis not present

## 2018-10-14 DIAGNOSIS — R09A2 Foreign body sensation, throat: Secondary | ICD-10-CM

## 2018-10-14 DIAGNOSIS — R198 Other specified symptoms and signs involving the digestive system and abdomen: Secondary | ICD-10-CM

## 2018-10-14 DIAGNOSIS — M4316 Spondylolisthesis, lumbar region: Secondary | ICD-10-CM | POA: Diagnosis not present

## 2018-10-14 DIAGNOSIS — E7849 Other hyperlipidemia: Secondary | ICD-10-CM

## 2018-10-14 DIAGNOSIS — L4052 Psoriatic arthritis mutilans: Secondary | ICD-10-CM

## 2018-10-14 DIAGNOSIS — R238 Other skin changes: Secondary | ICD-10-CM | POA: Diagnosis not present

## 2018-10-14 MED ORDER — OXYCODONE-ACETAMINOPHEN 5-325 MG PO TABS: 1.0000 | ORAL_TABLET | Freq: Every day | ORAL | 0 refills | Status: DC | PRN

## 2018-10-14 NOTE — Progress Notes (Signed)
Subjective:  Patient ID: Annaleia SMT LOKEY, female    DOB: 07/06/1956  Age: 63 y.o. MRN: [id number]  CC: The primary encounter diagnosis was Spondylolisthesis of lumbar region. 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  HPI Audriana D Arico presents for   Follow up on chronic issues and medication refill. 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 Very apprehesive about the safety profile of med.  Had a self limited reaction several months ago;  had received influenza vaccine followed by self administered dose of Cosentyx the following day.  On day 3  Had one glass of wine while attending a baby shower. Became flushed,  presyncopal ,  Symptoms resolved after about one hour   Was referred to [facility]  But received an appointment with [facility] so didn't go. Still needs to see dermatology foe evaluation of new papules on forehead.   Feels like her food is stopping at her sternum,  below the manubrium .  No coughing with eating , no choking or  regurgitation,  But feels like she has a knot in that area and a lump at her manubrium .  Doesn't want a GI workup unless anti reflux meds do not relieve the sensation.  has a lot of morning phlegm    Persistent low back pain Wants a second opinion on whether she has a surgically reducible issue   (Saw Earle Gell)  Outpatient Medications Prior to Visit  Medication Sig Dispense Refill  . aspirin 81 MG tablet Take 81 mg  by mouth daily.      . calcium-vitamin D (OSCAL WITH D) 250-125 MG-UNIT tablet Take 1 tablet by mouth daily.    . celecoxib (CELEBREX) 200 MG capsule Take 200 mg by mouth 2 (two) times daily.    . cholecalciferol (VITAMIN D) 1000 UNITS tablet Take 1,000 Units by mouth daily.      . diclofenac sodium (VOLTAREN) 1 % GEL Apply topically 4 (four) times daily.    Marland Kitchen FLUoxetine (PROZAC) 20 MG capsule Take 1 capsule (20 mg total) by mouth daily. 90 capsule 1  . Lactobacillus-Inulin (CULTURELLE DIGESTIVE HEALTH PO) Take 1 tablet by mouth daily.     . Multiple Vitamin (MULTIVITAMIN) tablet Take 1 tablet by mouth daily.    . Omega-3 Fatty Acids (FISH OIL) 1200 MG CAPS Take 1,200 mg by mouth daily.     Vladimir Faster Glycol-Propyl Glycol (SYSTANE ULTRA) 0.4-0.3 % SOLN Place 1 drop into both eyes daily as needed (for dry eyes).    . predniSONE (STERAPRED UNI-PAK 48 TAB) 10 MG (48) TBPK tablet Take by mouth as needed.    . Probiotic Product (PROBIOTIC PO) Take 1 tablet by mouth daily.    . Secukinumab (COSENTYX) 150 MG/ML SOSY Inject into the skin every 30 (thirty) days.     Marland Kitchen VYVANSE 50 MG capsule Take 1 tablet by mouth every morning.    . zolpidem (AMBIEN) 10 MG tablet Take 5 mg by mouth at bedtime.     Marland Kitchen oxyCODONE-acetaminophen (PERCOCET/ROXICET) 5-325 MG tablet Take  1-2 tablets by mouth daily as needed for moderate pain. 45 tablet 0  . oxyCODONE-acetaminophen (PERCOCET/ROXICET) 5-325 MG tablet Take 1-2 tablets by mouth daily as needed for moderate pain. 45 tablet 0  . oxyCODONE-acetaminophen (PERCOCET/ROXICET) 5-325 MG tablet Take 1-2 tablets by mouth daily as needed for moderate pain. 45 tablet 0  . amphetamine-dextroamphetamine (ADDERALL) 20 MG tablet Take 20 mg by mouth 2 (two) times daily.    . DULoxetine (CYMBALTA) 30 MG capsule Take 1 capsule (30 mg total) by mouth daily. (Patient not taking: Reported on 07/10/2018) 30 capsule 3  . Methylfol-Methylcob-Acetylcyst (METAFOLBIC PLUS PO) Take by mouth daily.      No facility-administered medications prior to visit.     Review of Systems;  Patient denies headache, fevers, malaise, unintentional weight loss, , eye pain, sinus congestion and sinus pain, sore throat, dysphagia,  hemoptysis , cough, dyspnea, wheezing, chest pain, palpitations, orthopnea, edema, abdominal pain, nausea, melena, diarrhea, constipation, flank pain, dysuria, hematuria, urinary  Frequency, nocturia, numbness, tingling, seizures,  Focal weakness, Loss of consciousness,  Tremor, insomnia, depression, anxiety, and suicidal ideation.      Objective:  BP (!) 112/58 (BP Location: Left Arm, Patient Position: Sitting, Cuff Size: Normal)   Pulse 78   Temp 98.1 F (36.7 C) (Oral)   Resp 14   Ht 5' 4.5" (1.638 m)   Wt 177 lb 3.2 oz (80.4 kg)   SpO2 99%   BMI 29.95 kg/m   BP Readings from Last 3 Encounters:  10/14/18 (!) 112/58  07/10/18 110/64  07/10/18 110/64    Wt Readings from Last 3 Encounters:  10/14/18 177 lb 3.2 oz (80.4 kg)  07/10/18 172 lb 9.6 oz (78.3 kg)  07/10/18 172 lb 6.4 oz (78.2 kg)    General appearance: alert, cooperative and appears stated age Face: 2 mm flesh colored umbilicated papule on forehead  Ears: normal TM's and external ear canals both ears Throat: lips, mucosa, and tongue normal; teeth and gums normal Neck: no adenopathy, no carotid bruit, supple, symmetrical, trachea midline and thyroid not enlarged, symmetric, no tenderness/mass/nodules Back: symmetric, no curvature. ROM normal. No CVA tenderness. Lungs: clear to auscultation bilaterally Heart: regular rate and rhythm, S1, S2 normal, no murmur, click, rub or gallop Abdomen: soft, non-tender; bowel sounds normal; no masses,  no organomegaly Pulses: 2+ and symmetric MSK: arthritic changes to both hands affecting all MCPS and PIPs Skin: Skin color, texture, turgor normal. No rashes or lesions Lymph nodes: Cervical, supraclavicular, and axillary nodes normal.  No results found for:  HGBA1C  Lab Results  Component Value Date   CREATININE 0.76 07/10/2018   CREATININE 0.90 12/14/2017   CREATININE 0.65 09/11/2017    Lab Results  Component Value Date   WBC 5.5 10/27/2016   HGB 12.6 10/27/2016   HCT 37.0 10/27/2016   PLT 254.0 10/27/2016   GLUCOSE 96 07/10/2018   CHOL 373 (H) 09/11/2017   TRIG 83.0 09/11/2017   HDL 68.20 09/11/2017   LDLDIRECT 252.3 06/12/2012   LDLCALC 289 (H) 09/11/2017   ALT 16 07/10/2018   AST 17 07/10/2018   NA 136 07/10/2018   K 4.0 07/10/2018   CL 100 07/10/2018   CREATININE 0.76 07/10/2018   BUN 26 (H) 07/10/2018   CO2 28 07/10/2018   TSH 1.620 10/27/2016     Assessment & Plan:   Problem List Items Addressed This Visit    ADD (attention deficit disorder)    Stable managed for years by psychiatry.  She is asking  me to manage refills  In the future, which I have agreed to do following receipt of records from her psychiatrist.       GERD (gastroesophageal reflux disease)    Advised to resume PPI . 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   Oxycodone prescribed by me for management of pain from chronic polyarthritis and DDD lumbar spine.  Prior treatment failure with Humira       Relevant Medications   celecoxib (CELEBREX) 200 MG capsule   oxyCODONE-acetaminophen (PERCOCET/ROXICET) 5-325 MG tablet (Start on 10/18/2018)   oxyCODONE-acetaminophen (PERCOCET/ROXICET) 5-325 MG tablet (Start on 11/17/2018)   oxyCODONE-acetaminophen (PERCOCET/ROXICET) 5-325 MG tablet (Start on 12/17/2018)   Spondylolisthesis of lumbar region - Primary    With persistent low back pain and right sided sciatica despite prior surgery.  Her pain is not relieved with  NSAIDs or recent ESI. .   Management of her pain requires  refill on narcotics for twice daily  use . Marland Kitchen Refill history confirmed via [place] Controlled Substance database, accessed by me today..she has had no narcotics sent in by any other  Provider. .refills for 90 days sent electronically   .  2nd neurosurgical opinion  Requested   And referral made to Dr Lacinda Axon       Relevant Orders   Ambulatory referral to Neurosurgery    Other Visit Diagnoses    Skin papules, generalized       Relevant Orders   Ambulatory referral to Dermatology      I have discontinued Evangelina D. Sturkey "[name]"'s DULoxetine, Methylfol-Methylcob-Acetylcyst (METAFOLBIC PLUS PO), and amphetamine-dextroamphetamine. I am also having her maintain her zolpidem, aspirin, cholecalciferol, Fish Oil, Lactobacillus-Inulin (CULTURELLE DIGESTIVE HEALTH PO), Polyethyl Glycol-Propyl Glycol, diclofenac sodium, VYVANSE, predniSONE, calcium-vitamin D, Secukinumab, Probiotic Product (PROBIOTIC PO), multivitamin, FLUoxetine, celecoxib, oxyCODONE-acetaminophen, oxyCODONE-acetaminophen, and oxyCODONE-acetaminophen.  Meds ordered this encounter  Medications  . oxyCODONE-acetaminophen (PERCOCET/ROXICET) 5-325 MG tablet    Sig: Take 1-2 tablets by mouth daily as needed for moderate pain.    Dispense:  45 tablet    Refill:  0  . oxyCODONE-acetaminophen (PERCOCET/ROXICET) 5-325 MG tablet    Sig: Take 1-2 tablets by mouth daily as needed for moderate pain.    Dispense:  45 tablet    Refill:  0  . oxyCODONE-acetaminophen (PERCOCET/ROXICET) 5-325 MG tablet    Sig: Take 1-2 tablets by mouth daily as needed for moderate pain.    Dispense:  45 tablet    Refill:  0   A total of 40 minutes was spent with patient more than half of which was spent in counseling patient on the above mentioned issues , reviewing and explaining recent labs and imaging studies done, and coordination of care.  Medications Discontinued During This Encounter  Medication Reason   . amphetamine-dextroamphetamine (ADDERALL) 20 MG tablet Patient has not taken in last 30 days  . DULoxetine (CYMBALTA) 30 MG capsule Patient has not taken in last 30 days  . Methylfol-Methylcob-Acetylcyst (METAFOLBIC PLUS PO) Patient has not taken in last 30 days  . oxyCODONE-acetaminophen (PERCOCET/ROXICET) 5-325 MG tablet Reorder  . oxyCODONE-acetaminophen (PERCOCET/ROXICET) 5-325 MG tablet Reorder  . oxyCODONE-acetaminophen (  PERCOCET/ROXICET) 5-325 MG tablet Reorder    Follow-up: Return in about 3 months (around 01/13/2019) for needs fasting labs same day .   Crecencio Mc, MD

## 2018-10-14 NOTE — Patient Instructions (Addendum)
Nexium Prilosec or Prevacid  are all good PPI 's to take  Once daily in the morning for 2 weeks  .  If the 'stuck feeling" in the throat does not resolve,    We will start workup for esophageal stricture.  I have refilled the oxycocone for January, Feb and March  We will do fasting labs at your next visit in April  Globus Pharyngeus Globus pharyngeus is a condition that makes it feel like you have a lump in your throat. It may also feel like you have something stuck in the front of your throat. This feeling may come and go. It is not painful, and it does not make it harder to swallow food or liquid. Globus pharyngeus does not cause changes that a health care provider can see during a physical exam. This condition usually goes away without treatment. What are the causes? Often, no cause can be found. The most common cause of globus pharyngeus is a condition that causes stomach juices to flow back up into the throat (gastroesophageal reflux). Other possible causes include:  Overstimulation of nerves that control swallowing.  Irritation of nerves that control swallowing (neuralgia).  An enlarged gland in the lower neck (thyroid gland).  Growth of tonsil tissue at the base of the tongue (lingual tonsil).  Anxiety.  Depression. What are the signs or symptoms? The main symptom of this condition is a feeling of a lump in your throat. This feeling usually comes and goes. How is this diagnosed? This condition may be diagnosed after other conditions have been ruled out. You may have tests, such as:  A swallow study.  Ear, nose, and throat evaluation.  An exam of your throat using a thin, flexible tube with a light and camera on the end (endoscopy). How is this treated? This condition may go away on its own, without treatment. In some cases, antidepressant medicines may be helpful. Follow these instructions at home:   Follow instructions from your health care provider about eating or  drinking restrictions.  Take over-the-counter and prescription medicines only as told by your health care provider.  Keep all follow-up visits as told by your health care provider. This is important.  Follow instructions from your health care provider about home care for gastroesophageal reflux. Your health care provider may recommend that you: ? Do not eat or drink anything that causes heartburn. ? Do not eat heavy meals close to bedtime. ? Do not drink caffeine. ? Do not drink alcohol. ? Raise the head of your bed. ? Sleep on your left side. Contact a health care provider if:  Your symptoms get worse.  You have throat pain.  You have trouble swallowing.  Food or liquid comes back up into your mouth.  You lose weight without trying. Get help right away if:  You develop swelling in your throat. Summary  Globus pharyngeus is a condition that makes it feel like you have a lump in your throat.  This condition usually goes away without treatment. This information is not intended to replace advice given to you by your health care provider. Make sure you discuss any questions you have with your health care provider. Document Released: 05/31/2016 Document Revised: 05/31/2016 Document Reviewed: 05/31/2016 Elsevier Interactive Patient Education  2019 [employer].

## 2018-10-14 NOTE — Telephone Encounter (Signed)
Orders needed for Fasting labs. Pt would like to have labs done before appt. Lab appt is on 04/09 Please and Thank you!

## 2018-10-15 DIAGNOSIS — J069 Acute upper respiratory infection, unspecified: Secondary | ICD-10-CM | POA: Diagnosis not present

## 2018-10-15 DIAGNOSIS — M150 Primary generalized (osteo)arthritis: Secondary | ICD-10-CM | POA: Diagnosis not present

## 2018-10-15 DIAGNOSIS — M797 Fibromyalgia: Secondary | ICD-10-CM | POA: Diagnosis not present

## 2018-10-15 DIAGNOSIS — L4050 Arthropathic psoriasis, unspecified: Secondary | ICD-10-CM | POA: Diagnosis not present

## 2018-10-15 DIAGNOSIS — M5136 Other intervertebral disc degeneration, lumbar region: Secondary | ICD-10-CM | POA: Diagnosis not present

## 2018-10-15 DIAGNOSIS — Z79899 Other long term (current) drug therapy: Secondary | ICD-10-CM | POA: Diagnosis not present

## 2018-10-15 NOTE — Telephone Encounter (Signed)
Pt has a 3 month follow up scheduled in April and would like to have labs done prior to her appt.

## 2018-10-15 NOTE — Assessment & Plan Note (Signed)
She will resume a daily PPI,  If symptomsm do not resolve ,  A barium swallow will be ordered by me, followed by GI consult

## 2018-10-15 NOTE — Assessment & Plan Note (Signed)
Managed by rheumatology with Cosentyx monhtly injections,  Bid celebrex and prn prednisone tapers.   Oxycodone prescribed by me for management of pain from chronic polyarthritis and DDD lumbar spine.  Prior treatment failure with Humira

## 2018-10-15 NOTE — Assessment & Plan Note (Signed)
Stable managed for years by psychiatry.  She is asking me to manage refills  In the future, which I have agreed to do following receipt of records from her psychiatrist.

## 2018-10-15 NOTE — Assessment & Plan Note (Signed)
Advised to resume PPI . Last EGD 2009

## 2018-10-15 NOTE — Assessment & Plan Note (Addendum)
With persistent low back pain and right sided sciatica despite prior surgery.  Her pain is not relieved with NSAIDs or recent ESI. .   Management of her pain requires  refill on narcotics for twice daily  use . Marland Kitchen Refill history confirmed via [place] Controlled Substance database, accessed by me today..she has had no narcotics sent in by any other  Provider. .refills for Jan, Feb and Marhch 2020  sent electronically   .  2nd neurosurgical opinion  Requested   And referral made to Dr Lacinda Axon

## 2018-10-17 ENCOUNTER — Ambulatory Visit: Payer: Medicare HMO | Admitting: Psychiatry

## 2018-10-17 ENCOUNTER — Encounter: Payer: Self-pay | Admitting: Psychiatry

## 2018-10-17 VITALS — BP 154/78 | HR 85

## 2018-10-17 DIAGNOSIS — F900 Attention-deficit hyperactivity disorder, predominantly inattentive type: Secondary | ICD-10-CM

## 2018-10-17 DIAGNOSIS — F3342 Major depressive disorder, recurrent, in full remission: Secondary | ICD-10-CM | POA: Diagnosis not present

## 2018-10-17 DIAGNOSIS — R69 Illness, unspecified: Secondary | ICD-10-CM | POA: Diagnosis not present

## 2018-10-17 DIAGNOSIS — F5105 Insomnia due to other mental disorder: Secondary | ICD-10-CM | POA: Diagnosis not present

## 2018-10-17 MED ORDER — LISDEXAMFETAMINE DIMESYLATE 50 MG PO CAPS: 50.0000 mg | ORAL_CAPSULE | Freq: Every day | ORAL | 0 refills | Status: DC

## 2018-10-17 MED ORDER — VYVANSE 50 MG PO CAPS: 50.0000 mg | ORAL_CAPSULE | Freq: Every day | ORAL | 0 refills | Status: DC

## 2018-10-17 NOTE — Progress Notes (Signed)
Madeline Young [id number] [birth year] 63 y.o.  Subjective:   Patient ID:  Madeline Young is a 63 y.o. (DOB [birth year]) female.  Chief Complaint:  Chief Complaint  Patient presents with  . Follow-up    Medication Management    HPI Madeline Young presents to the office today for follow-up of ADD and anxiety and depression and sleep.  Chronic health problems and stressors.  D had a baby in December boy doing well except reflux.  Doing well with the meds.  Since Xmas restless.  Still has ADD and maybe worse.  Doesn't finish things and forgets.  Tried NAC but didn't seem to help.   Otherwise mood ok.  Some increase in anxiety without any reason.  Not much caffeine.  Also lately EMA variably back to sleep.  Can feel off the next day.  Still some forgetfulness.  Review of Systems:  Review of Systems  Musculoskeletal: Positive for arthralgias and back pain.  Neurological: Negative for tremors and weakness.  Psychiatric/Behavioral: Positive for sleep disturbance. Negative for agitation, behavioral problems, confusion, decreased concentration, dysphoric mood, hallucinations, self-injury and suicidal ideas. The patient is not nervous/anxious and is not hyperactive.     Medications: I have reviewed the patient's current medications.  Current Outpatient Medications  Medication Sig Dispense Refill  . aspirin 81 MG tablet Take 81 mg by mouth daily.      . calcium-vitamin D (OSCAL WITH D) 250-125 MG-UNIT tablet Take 1 tablet by mouth daily.    . celecoxib (CELEBREX) 200 MG capsule Take 200 mg by mouth 2 (two) times daily.    . cholecalciferol (VITAMIN D) 1000 UNITS tablet Take 1,000 Units by mouth daily.      . diclofenac sodium (VOLTAREN) 1 % GEL Apply topically 4 (four) times daily.    Marland Kitchen FLUoxetine (PROZAC) 20 MG capsule Take 1 capsule (20 mg total) by mouth daily. 90 capsule 1  . Lactobacillus-Inulin (CULTURELLE DIGESTIVE HEALTH PO) Take 1 tablet by mouth daily.     . Multiple  Vitamin (MULTIVITAMIN) tablet Take 1 tablet by mouth daily.    . Omega-3 Fatty Acids (FISH OIL) 1200 MG CAPS Take 1,200 mg by mouth daily.     Derrill Memo ON 12/17/2018] oxyCODONE-acetaminophen (PERCOCET/ROXICET) 5-325 MG tablet Take 1-2 tablets by mouth daily as needed for moderate pain. 45 tablet 0  . Polyethyl Glycol-Propyl Glycol (SYSTANE ULTRA) 0.4-0.3 % SOLN Place 1 drop into both eyes daily as needed (for dry eyes).    . predniSONE (STERAPRED UNI-PAK 48 TAB) 10 MG (48) TBPK tablet Take by mouth as needed.    . Secukinumab (COSENTYX) 150 MG/ML SOSY Inject into the skin every 30 (thirty) days.     Marland Kitchen VYVANSE 50 MG capsule Take 1 tablet by mouth every morning.    . zolpidem (AMBIEN) 10 MG tablet Take 5 mg by mouth at bedtime.     Derrill Memo ON 10/18/2018] oxyCODONE-acetaminophen (PERCOCET/ROXICET) 5-325 MG tablet Take 1-2 tablets by mouth daily as needed for moderate pain. (Patient not taking: Reported on 10/17/2018) 45 tablet 0  . [START ON 11/17/2018] oxyCODONE-acetaminophen (PERCOCET/ROXICET) 5-325 MG tablet Take 1-2 tablets by mouth daily as needed for moderate pain. (Patient not taking: Reported on 10/17/2018) 45 tablet 0  . Probiotic Product (PROBIOTIC PO) Take 1 tablet by mouth daily.     No current facility-administered medications for this visit.     Medication Side Effects: None  Allergies:  Allergies  Allergen Reactions  . Sertraline Hcl Other (See Comments)  Made me crazy   . Avelox [Moxifloxacin Hcl In Nacl]     Pt cant remember reaction  . Latex Itching and Rash    Redness  . Macrobid [employer] Macro] Other (See Comments)    tired  . Statins Other (See Comments)    Muscle aches    Past Medical History:  Diagnosis Date  . Adenoma 10/06/2008   sigmoid 35mm  . ADHD (attention deficit hyperactivity disorder)   . Allergy   . Anxiety   . Arthritis   . Cataract    bil cateracts removed  . Complication of anesthesia    first colonoscopy pt states she woke up  .  Depression   . GERD (gastroesophageal reflux disease)   . Globus sensation   . Hyperlipidemia   . Insomnia   . Multinodular goiter (nontoxic)   . Psoriatic arthritis ([place])   . Spinal stenosis of lumbar region June 2016   MRI   . Statin intolerance 01/27/2013    Family History  Problem Relation Age of Onset  . Heart disease Mother   . Hyperlipidemia Mother   . Coronary artery disease Father 45       CABG in early 9's  . Hyperlipidemia Father   . Epilepsy Grandchild        Severe form  . Colon cancer Neg Hx   . Esophageal cancer Neg Hx   . Rectal cancer Neg Hx   . Stomach cancer Neg Hx     Social History   Socioeconomic History  . Marital status: Married    Spouse name: Not on file  . Number of children: 2  . Years of education: Not on file  . Highest education level: Not on file  Occupational History  . Occupation: disability  Social Needs  . Financial resource strain: Not hard at all  . Food insecurity:    Worry: Never true    Inability: Never true  . Transportation needs:    Medical: No    Non-medical: No  Tobacco Use  . Smoking status: Former Smoker    Types: Cigarettes    Last attempt to quit: 08/21/1971    Years since quitting: 47.1  . Smokeless tobacco: Never Used  Substance and Sexual Activity  . Alcohol use: Yes    Alcohol/week: 7.0 standard drinks    Types: 7 Glasses of wine per week  . Drug use: No  . Sexual activity: Not on file  Lifestyle  . Physical activity:    Days per week: 2 days    Minutes per session: 50 min  . Stress: Not on file  Relationships  . Social connections:    Talks on phone: Not on file    Gets together: Not on file    Attends religious service: Not on file    Active member of club or organization: Not on file    Attends meetings of clubs or organizations: Not on file    Relationship status: Married  . Intimate partner violence:    Fear of current or ex partner: No    Emotionally abused: No    Physically abused: No     Forced sexual activity: No  Other Topics Concern  . Not on file  Social History Narrative   Married, retired Therapist, sports   2 daughters some grandchildren   2 caffeinated drinks daily   1 alcoholic beverage daily   No tobacco   04/29/2015    Past Medical History, Surgical history, Social history, and  Family history were reviewed and updated as appropriate.   Please see review of systems for further details on the patient's review from today.   Objective:   Physical Exam:  BP (!) 154/78 (BP Location: Left Arm)   Pulse 85   Physical Exam Constitutional:      General: She is not in acute distress.    Appearance: Normal appearance. She is well-developed.  Musculoskeletal:        General: No deformity.  Neurological:     Mental Status: She is alert and oriented to person, place, and time.     Motor: No tremor.     Coordination: Coordination normal.     Gait: Gait normal.  Psychiatric:        Attention and Perception: Perception normal. She is inattentive.        Mood and Affect: Mood is not anxious or depressed. Affect is not labile, blunt, angry or inappropriate.        Speech: Speech normal.        Behavior: Behavior normal.        Thought Content: Thought content normal. Thought content does not include homicidal or suicidal ideation. Thought content does not include homicidal or suicidal plan.        Cognition and Memory: Cognition normal.        Judgment: Judgment normal.     Comments: Insight intact. No auditory or visual hallucinations. No delusions.      Lab Review:     Component Value Date/Time   NA 136 07/10/2018 1417   K 4.0 07/10/2018 1417   CL 100 07/10/2018 1417   CO2 28 07/10/2018 1417   GLUCOSE 96 07/10/2018 1417   BUN 26 (H) 07/10/2018 1417   CREATININE 0.76 07/10/2018 1417   CALCIUM 9.5 07/10/2018 1417   PROT 7.3 07/10/2018 1417   ALBUMIN 4.4 07/10/2018 1417   AST 17 07/10/2018 1417   ALT 16 07/10/2018 1417   ALKPHOS 52 07/10/2018 1417   BILITOT 0.6  07/10/2018 1417   GFRNONAA >60 02/18/2016 0322   GFRAA >60 02/18/2016 0322       Component Value Date/Time   WBC 5.5 10/27/2016 1424   RBC 4.03 10/27/2016 1424   HGB 12.6 10/27/2016 1424   HCT 37.0 10/27/2016 1424   PLT 254.0 10/27/2016 1424   MCV 91.7 10/27/2016 1424   MCH 31.0 02/18/2016 0322   MCHC 34.1 10/27/2016 1424   RDW 13.3 10/27/2016 1424   LYMPHSABS 1.9 10/27/2016 1424   MONOABS 0.4 10/27/2016 1424   EOSABS 0.1 10/27/2016 1424   BASOSABS 0.0 10/27/2016 1424    No results found for: POCLITH, LITHIUM   No results found for: PHENYTOIN, PHENOBARB, VALPROATE, CBMZ   .res Assessment: Plan:    Attention deficit hyperactivity disorder (ADHD), predominantly inattentive type  Insomnia due to mental condition  Major depression, recurrent, full remission ([place])   Good response to psych meds for depression ADD and sleep.  Able to get by with 5 mg Ambien usually.  Disc risk of Amnesia.  Disc pros/cons of switching stimulants.  Disc SE and withdrawal and dosing options.  Would be best to control it.  Disc risk steroids affecting mood and sleep.  BP is usually not this high  FU 6 mos.  Lynder Parents, MD, DFAPA   Please see After Visit Summary for patient specific instructions.  Future Appointments  Date Time Provider [place]  01/16/2019  8:30 AM [facility]  01/23/2019  9:30 AM  Crecencio Mc, MD [facility]  07/16/2019 10:00 AM O'Brien-Blaney, Denisa L, LPN [facility]  28/01/1323 10:30 AM Crecencio Mc, MD [facility]    No orders of the defined types were placed in this encounter.     -------------------------------

## 2018-10-18 ENCOUNTER — Other Ambulatory Visit: Payer: Self-pay | Admitting: Psychiatry

## 2018-11-05 DIAGNOSIS — M545 Low back pain: Secondary | ICD-10-CM | POA: Diagnosis not present

## 2018-11-05 DIAGNOSIS — G8929 Other chronic pain: Secondary | ICD-10-CM | POA: Diagnosis not present

## 2018-11-06 ENCOUNTER — Other Ambulatory Visit: Payer: Self-pay | Admitting: Student

## 2018-11-06 DIAGNOSIS — M545 Low back pain, unspecified: Secondary | ICD-10-CM

## 2018-11-11 DIAGNOSIS — M5136 Other intervertebral disc degeneration, lumbar region: Secondary | ICD-10-CM | POA: Diagnosis not present

## 2018-11-11 DIAGNOSIS — Z79899 Other long term (current) drug therapy: Secondary | ICD-10-CM | POA: Diagnosis not present

## 2018-11-11 DIAGNOSIS — R21 Rash and other nonspecific skin eruption: Secondary | ICD-10-CM | POA: Diagnosis not present

## 2018-11-11 DIAGNOSIS — Z889 Allergy status to unspecified drugs, medicaments and biological substances status: Secondary | ICD-10-CM | POA: Diagnosis not present

## 2018-11-11 DIAGNOSIS — L4050 Arthropathic psoriasis, unspecified: Secondary | ICD-10-CM | POA: Diagnosis not present

## 2018-11-11 DIAGNOSIS — M150 Primary generalized (osteo)arthritis: Secondary | ICD-10-CM | POA: Diagnosis not present

## 2018-11-11 DIAGNOSIS — J069 Acute upper respiratory infection, unspecified: Secondary | ICD-10-CM | POA: Diagnosis not present

## 2018-11-11 DIAGNOSIS — M797 Fibromyalgia: Secondary | ICD-10-CM | POA: Diagnosis not present

## 2018-11-12 DIAGNOSIS — L821 Other seborrheic keratosis: Secondary | ICD-10-CM | POA: Diagnosis not present

## 2018-11-28 ENCOUNTER — Ambulatory Visit: Payer: Medicare HMO

## 2018-12-13 ENCOUNTER — Ambulatory Visit: Payer: Medicare HMO

## 2018-12-16 ENCOUNTER — Ambulatory Visit: Payer: Medicare HMO

## 2018-12-17 DIAGNOSIS — L700 Acne vulgaris: Secondary | ICD-10-CM | POA: Diagnosis not present

## 2018-12-17 DIAGNOSIS — L400 Psoriasis vulgaris: Secondary | ICD-10-CM | POA: Diagnosis not present

## 2019-01-01 ENCOUNTER — Ambulatory Visit: Payer: Medicare HMO

## 2019-01-12 ENCOUNTER — Other Ambulatory Visit: Payer: Self-pay | Admitting: Psychiatry

## 2019-01-13 ENCOUNTER — Other Ambulatory Visit: Payer: Self-pay

## 2019-01-13 ENCOUNTER — Other Ambulatory Visit (INDEPENDENT_AMBULATORY_CARE_PROVIDER_SITE_OTHER): Payer: Medicare HMO

## 2019-01-13 DIAGNOSIS — L4052 Psoriatic arthritis mutilans: Secondary | ICD-10-CM

## 2019-01-13 DIAGNOSIS — E042 Nontoxic multinodular goiter: Secondary | ICD-10-CM

## 2019-01-13 DIAGNOSIS — E7849 Other hyperlipidemia: Secondary | ICD-10-CM | POA: Diagnosis not present

## 2019-01-13 LAB — COMPREHENSIVE METABOLIC PANEL
ALT: 19 U/L (ref 0–35)
AST: 21 U/L (ref 0–37)
Albumin: 4.2 g/dL (ref 3.5–5.2)
Alkaline Phosphatase: 60 U/L (ref 39–117)
BUN: 27 mg/dL — ABNORMAL HIGH (ref 6–23)
CO2: 30 mEq/L (ref 19–32)
Calcium: 9.2 mg/dL (ref 8.4–10.5)
Chloride: 102 mEq/L (ref 96–112)
Creatinine, Ser: 0.65 mg/dL (ref 0.40–1.20)
GFR: 91.65 mL/min (ref >60.00)
Glucose, Bld: 95 mg/dL (ref 70–99)
Potassium: 3.8 mEq/L (ref 3.5–5.1)
Sodium: 139 mEq/L (ref 135–145)
Total Bilirubin: 0.5 mg/dL (ref 0.2–1.2)
Total Protein: 6.5 g/dL (ref 6.0–8.3)

## 2019-01-13 LAB — CBC WITH DIFFERENTIAL/PLATELET
Basophils Absolute: 0 10*3/uL (ref 0.0–0.1)
Basophils Relative: 0.6 % (ref 0.0–3.0)
Eosinophils Absolute: 0.1 10*3/uL (ref 0.0–0.7)
Eosinophils Relative: 2.5 % (ref 0.0–5.0)
HCT: 38.5 % (ref 36.0–46.0)
Hemoglobin: 13.1 g/dL (ref 12.0–15.0)
Lymphocytes Relative: 32.9 % (ref 12.0–46.0)
Lymphs Abs: 1.7 10*3/uL (ref 0.7–4.0)
MCHC: 34 g/dL (ref 30.0–36.0)
MCV: 96.5 fl (ref 78.0–100.0)
Monocytes Absolute: 0.4 10*3/uL (ref 0.1–1.0)
Monocytes Relative: 7.4 % (ref 3.0–12.0)
Neutro Abs: 3 10*3/uL (ref 1.4–7.7)
Neutrophils Relative %: 56.6 % (ref 43.0–77.0)
Platelets: 242 10*3/uL (ref 150.0–400.0)
RBC: 3.99 Mil/uL (ref 3.87–5.11)
RDW: 13 % (ref 11.5–15.5)
WBC: 5.3 10*3/uL (ref 4.0–10.5)

## 2019-01-13 LAB — TSH: TSH: 1.02 u[IU]/mL (ref 0.35–4.50)

## 2019-01-13 LAB — LIPID PANEL
Cholesterol: 397 mg/dL — ABNORMAL HIGH (ref 0–200)
HDL: 56.1 mg/dL (ref >39.00)
LDL Cholesterol: 322 mg/dL — ABNORMAL HIGH (ref 0–99)
NonHDL: 340.91
Total CHOL/HDL Ratio: 7
Triglycerides: 97 mg/dL (ref 0.0–149.0)
VLDL: 19.4 mg/dL (ref 0.0–40.0)

## 2019-01-13 LAB — SEDIMENTATION RATE: Sed Rate: 12 mm/hr (ref 0–30)

## 2019-01-16 ENCOUNTER — Other Ambulatory Visit: Payer: Medicare HMO

## 2019-01-20 ENCOUNTER — Ambulatory Visit (INDEPENDENT_AMBULATORY_CARE_PROVIDER_SITE_OTHER): Payer: Medicare HMO | Admitting: Internal Medicine

## 2019-01-20 DIAGNOSIS — L4052 Psoriatic arthritis mutilans: Secondary | ICD-10-CM

## 2019-01-20 MED ORDER — OXYCODONE-ACETAMINOPHEN 5-325 MG PO TABS: 1.0000 | ORAL_TABLET | Freq: Every day | ORAL | 0 refills | Status: DC | PRN

## 2019-01-20 MED ORDER — DICLOFENAC SODIUM 1 % TD GEL: 2.0000 g | Freq: Four times a day (QID) | TRANSDERMAL | 3 refills | Status: DC

## 2019-01-20 NOTE — Progress Notes (Signed)
Telephone Note   This visit type was conducted due to national recommendations for restrictions regarding the COVID-19 pandemic (e.g. social distancing).  This format is felt to be most appropriate for this patient at this time.  All issues noted in this document were discussed and addressed.  No physical exam was performed (except for noted visual exam findings with Video Visits).   I connected with@ on 01/21/19 at  2:30 PM EDT by a video enabled telemedicine application or telephone and verified that I am speaking with the correct person using two identifiers. Location patient: home Location provider: work or home office Persons participating in the virtual visit: patient, provider  I discussed the limitations, risks, security and privacy concerns of performing an evaluation and management service by telephone and the availability of in person appointments. I also discussed with the patient that there may be a patient responsible charge related to this service. The patient expressed understanding and agreed to proceed.  Interactive audio and video telecommunications were attempted between this provider and patient, however failed, due to patient having technical difficulties OR patient did not have access to video capability.  We continued and completed visit with audio only.   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  Since her last visit in January  She has had worsening pain  secondary to psoriatic arthritis, under treatment by Rheumatology Dr Dossie Der, with recent adverse dermatologic reaction in February that she has presumed to be due to Cosentyx.  Patient reports that she developed a diffuse rash that started on thighs and spread to abdomen  that was diagnosed as psoriasis by Dr Evorn Gong and treated with topical Kenolog followed by systemic steroids when rash spread to genitals mouth and throat.  Oral  steroids prescribed by Dr Dossie Der , patient chose to do to attend her daughter's  nursing school graduation  ceremony (systemic steroids  was NOT  Advised by dermatology).  The oral steroids  helped transiently, but was  followed by recurrence of rash "with a vengeance" and was accompanied by pain and burning.  Dr Evorn Gong treated with stronger topical steroid and recommended her PA be treated with an alternative to Cosentyx,  However Dr. Dossie Der has been out of the country due to COVID 19 travel restrictions. Has an appt with her on April  20 .  Uneasy about starting another biologic in the current epidemic .    She is using salt soaks, voltaren cream and celebrex for the joint pain.  Limiting oxycodone to 1.5 tablets daily   ROS: See pertinent positives and negatives per HPI.  Past Medical History:  Diagnosis Date  . Adenoma 10/06/2008   sigmoid 66mm  . ADHD (attention deficit hyperactivity disorder)   . Allergy   . Anxiety   . Arthritis   . Cataract    bil cateracts removed  . Complication of anesthesia    first colonoscopy pt states she woke up  . Depression   . GERD (gastroesophageal reflux disease)   . Globus sensation   . Hyperlipidemia   . Insomnia   . Multinodular goiter (nontoxic)   . Psoriatic arthritis ([place])   . Spinal stenosis of lumbar region June 2016   MRI   . Statin intolerance 01/27/2013    Past Surgical History:  Procedure Laterality Date  . BACK SURGERY  02/17/2016   Dr. Jacqulynn Cadet- L4-L5 fusion   .  BIOPSY THYROID  2014  . COLONOSCOPY W/ POLYPECTOMY    . EYE SURGERY     bilateral cataract surgery w/ lens implant  . FOOT SURGERY  2006   Right foot , secondary to severe loss of joint [facility])  . TONSILLECTOMY    . UPPER GASTROINTESTINAL ENDOSCOPY  2005   With empiric esophageal dilation    Family History  Problem Relation Age of Onset  . Heart disease Mother   . Hyperlipidemia Mother   . Coronary artery disease Father 19       CABG in early 44's  .  Hyperlipidemia Father   . Epilepsy Grandchild        Severe form  . Colon cancer Neg Hx   . Esophageal cancer Neg Hx   . Rectal cancer Neg Hx   . Stomach cancer Neg Hx     SOCIAL HX: retired/disabled RN    Current Outpatient Medications:  .  aspirin 81 MG tablet, Take 81 mg by mouth daily.  , Disp: , Rfl:  .  calcium-vitamin D (OSCAL WITH D) 250-125 MG-UNIT tablet, Take 1 tablet by mouth daily., Disp: , Rfl:  .  celecoxib (CELEBREX) 200 MG capsule, Take 200 mg by mouth 2 (two) times daily., Disp: , Rfl:  .  cholecalciferol (VITAMIN D) 1000 UNITS tablet, Take 1,000 Units by mouth daily.  , Disp: , Rfl:  .  clobetasol (TEMOVATE) 0.05 % external solution, APPLY TO AFFECTED AREAS ON SCALP TWICE DAILY UNTIL CLEAR, Disp: , Rfl:  .  diclofenac sodium (VOLTAREN) 1 % GEL, Apply 2 g topically 4 (four) times daily., Disp: 300 g, Rfl: 3 .  fluocinonide ointment (LIDEX) 0.05 %, APPLY TWICE DAILY TO AFFECTED AREAS UNTIL IMPROVED THEN AS NEEDED FOR FLARES, Disp: , Rfl:  .  FLUoxetine (PROZAC) 20 MG capsule, TAKE 1 CAPSULE BY MOUTH EVERY DAY, Disp: 90 capsule, Rfl: 1 .  ketoconazole (NIZORAL) 2 % shampoo, PLEASE SEE ATTACHED FOR DETAILED DIRECTIONS, Disp: , Rfl:  .  Lactobacillus-Inulin ([place] PO), Take 1 tablet by mouth daily. , Disp: , Rfl:  .  lisdexamfetamine (VYVANSE) 50 MG capsule, Take 1 capsule (50 mg total) by mouth daily., Disp: 30 capsule, Rfl: 0 .  lisdexamfetamine (VYVANSE) 50 MG capsule, Take 1 capsule (50 mg total) by mouth daily., Disp: 30 capsule, Rfl: 0 .  Multiple Vitamin (MULTIVITAMIN) tablet, Take 1 tablet by mouth daily., Disp: , Rfl:  .  Omega-3 Fatty Acids (FISH OIL) 1200 MG CAPS, Take 1,200 mg by mouth daily. , Disp: , Rfl:  .  oxyCODONE-acetaminophen (PERCOCET/ROXICET) 5-325 MG tablet, Take 1-2 tablets by mouth daily as needed for moderate pain., Disp: 45 tablet, Rfl: 0 .  oxyCODONE-acetaminophen (PERCOCET/ROXICET) 5-325 MG tablet, Take 1-2 tablets by mouth  daily as needed for moderate pain., Disp: 45 tablet, Rfl: 0 .  oxyCODONE-acetaminophen (PERCOCET/ROXICET) 5-325 MG tablet, Take 1-2 tablets by mouth daily as needed for moderate pain., Disp: 60 tablet, Rfl: 0 .  Polyethyl Glycol-Propyl Glycol (SYSTANE ULTRA) 0.4-0.3 % SOLN, Place 1 drop into both eyes daily as needed (for dry eyes)., Disp: , Rfl:  .  triamcinolone cream (KENALOG) 0.1 %, APPLY TO AFFECTED AREA TWICE A DAY UNTIL CLEAR THEN AS NEEDED, Disp: , Rfl:  .  zolpidem (AMBIEN) 5 MG tablet, TAKE 1 TABLET BY MOUTH AT BEDTIME, Disp: 90 tablet, Rfl: 1  EXAM: General impression: alert, cooperative and articulate.  No signs of being in distress  Lungs: not short of breath ,  No cough, speaking in full sentences  Psych: affect normal,speech is articulate and non pressured .  Denies suicidal thoughts    ASSESSMENT AND PLAN:  Discussed the following assessment and plan:  Psoriatic arthritis, destructive type (HCC)  Psoriatic arthritis, destructive type Currently off Cosentyx due to intolerance and recent development of psoriasis diagnosed by dermatology ([place]) which was aggravated by use of a steroid taper. She has had multiple other drug trials and is way of starting any new biologic during the COVID 19 epidemic .  Awaiting Rheumatology follow up with Dr Dossie Der April 20.  Continue celebrex qd-bid,  voltaren gel not more than twice daily ad increase percocet to #60/month for pain management.     I discussed the assessment and treatment plan with the patient. The patient was provided an opportunity to ask questions and all were answered. The patient agreed with the plan and demonstrated an understanding of the instructions.   The patient was advised to call back or seek an in-person evaluation if the symptoms worsen or if the condition fails to improve as anticipated.  I provided 24 minutes of non-face-to-face time during this encounter.   Crecencio Mc, MD

## 2019-01-20 NOTE — Patient Instructions (Signed)
Resume once daily Nexium to protect your GI tract from the anti inflammatories  Ok to use celebrex twice daily FOR SHORT PERIODS OF TIME (ONE WEEK )  Do not use Voltaren more than twice daily when you are using celebrex twice daily  I have increased the quantity of oxycodone to #60/month for your joint pain    You can take up to 2000 mg of acetominophen (tylenol) every day safely  In divided doses (500 mg every 6 hours  Or 1000 mg every 12 hours.)

## 2019-01-21 NOTE — Assessment & Plan Note (Addendum)
Currently off Cosentyx due to intolerance and recent development of psoriasis diagnosed by dermatology ([place]) which was aggravated by use of a steroid taper. She has had multiple other drug trials and is way of starting any new biologic during the COVID 19 epidemic .  Awaiting Rheumatology follow up with Dr Dossie Der April 20.  Continue celebrex qd-bid,  voltaren gel not more than twice daily ad increase percocet to #60/month for pain management.

## 2019-01-23 ENCOUNTER — Telehealth: Payer: Self-pay

## 2019-01-23 ENCOUNTER — Ambulatory Visit: Payer: Medicare HMO | Admitting: Internal Medicine

## 2019-01-23 NOTE — Telephone Encounter (Signed)
PA for diclofenac gel has been submitted on covermymeds.

## 2019-01-24 DIAGNOSIS — M1990 Unspecified osteoarthritis, unspecified site: Secondary | ICD-10-CM

## 2019-01-24 NOTE — Telephone Encounter (Signed)
Medication has been approved through 10/09/2019.

## 2019-01-27 ENCOUNTER — Ambulatory Visit: Payer: Medicare HMO

## 2019-01-27 DIAGNOSIS — Z79899 Other long term (current) drug therapy: Secondary | ICD-10-CM | POA: Diagnosis not present

## 2019-01-27 DIAGNOSIS — M797 Fibromyalgia: Secondary | ICD-10-CM | POA: Diagnosis not present

## 2019-01-27 DIAGNOSIS — L4050 Arthropathic psoriasis, unspecified: Secondary | ICD-10-CM | POA: Diagnosis not present

## 2019-01-27 DIAGNOSIS — R21 Rash and other nonspecific skin eruption: Secondary | ICD-10-CM | POA: Diagnosis not present

## 2019-01-27 DIAGNOSIS — Z889 Allergy status to unspecified drugs, medicaments and biological substances status: Secondary | ICD-10-CM | POA: Diagnosis not present

## 2019-01-27 DIAGNOSIS — M5136 Other intervertebral disc degeneration, lumbar region: Secondary | ICD-10-CM | POA: Diagnosis not present

## 2019-01-27 DIAGNOSIS — J069 Acute upper respiratory infection, unspecified: Secondary | ICD-10-CM | POA: Diagnosis not present

## 2019-01-27 DIAGNOSIS — M150 Primary generalized (osteo)arthritis: Secondary | ICD-10-CM | POA: Diagnosis not present

## 2019-02-18 ENCOUNTER — Telehealth: Payer: Self-pay | Admitting: Psychiatry

## 2019-02-18 ENCOUNTER — Other Ambulatory Visit: Payer: Self-pay

## 2019-02-18 DIAGNOSIS — F900 Attention-deficit hyperactivity disorder, predominantly inattentive type: Secondary | ICD-10-CM

## 2019-02-18 MED ORDER — LISDEXAMFETAMINE DIMESYLATE 50 MG PO CAPS: 50.0000 mg | ORAL_CAPSULE | Freq: Every day | ORAL | 0 refills | Status: DC

## 2019-02-24 DIAGNOSIS — J069 Acute upper respiratory infection, unspecified: Secondary | ICD-10-CM | POA: Diagnosis not present

## 2019-02-24 DIAGNOSIS — M5136 Other intervertebral disc degeneration, lumbar region: Secondary | ICD-10-CM | POA: Diagnosis not present

## 2019-02-24 DIAGNOSIS — L4050 Arthropathic psoriasis, unspecified: Secondary | ICD-10-CM | POA: Diagnosis not present

## 2019-02-24 DIAGNOSIS — Z79899 Other long term (current) drug therapy: Secondary | ICD-10-CM | POA: Diagnosis not present

## 2019-02-24 DIAGNOSIS — M797 Fibromyalgia: Secondary | ICD-10-CM | POA: Diagnosis not present

## 2019-02-24 DIAGNOSIS — M150 Primary generalized (osteo)arthritis: Secondary | ICD-10-CM | POA: Diagnosis not present

## 2019-02-24 DIAGNOSIS — Z889 Allergy status to unspecified drugs, medicaments and biological substances status: Secondary | ICD-10-CM | POA: Diagnosis not present

## 2019-02-24 DIAGNOSIS — R21 Rash and other nonspecific skin eruption: Secondary | ICD-10-CM | POA: Diagnosis not present

## 2019-03-10 ENCOUNTER — Ambulatory Visit: Payer: Medicare HMO

## 2019-03-14 ENCOUNTER — Other Ambulatory Visit: Payer: Self-pay

## 2019-03-14 ENCOUNTER — Ambulatory Visit
Admission: RE | Admit: 2019-03-14 | Discharge: 2019-03-14 | Disposition: A | Discharge status: Home / Self Care | Payer: Medicare HMO | Source: Ambulatory Visit | Attending: Internal Medicine | Admitting: Internal Medicine

## 2019-03-14 DIAGNOSIS — Z1231 Encounter for screening mammogram for malignant neoplasm of breast: Secondary | ICD-10-CM | POA: Diagnosis not present

## 2019-03-14 DIAGNOSIS — Z1239 Encounter for other screening for malignant neoplasm of breast: Secondary | ICD-10-CM

## 2019-04-14 ENCOUNTER — Other Ambulatory Visit: Payer: Self-pay | Admitting: Psychiatry

## 2019-04-14 ENCOUNTER — Ambulatory Visit (INDEPENDENT_AMBULATORY_CARE_PROVIDER_SITE_OTHER): Payer: Medicare HMO | Admitting: Internal Medicine

## 2019-04-14 ENCOUNTER — Encounter: Payer: Self-pay | Admitting: Internal Medicine

## 2019-04-14 ENCOUNTER — Other Ambulatory Visit: Payer: Self-pay

## 2019-04-14 DIAGNOSIS — M4316 Spondylolisthesis, lumbar region: Secondary | ICD-10-CM

## 2019-04-14 DIAGNOSIS — L4052 Psoriatic arthritis mutilans: Secondary | ICD-10-CM | POA: Diagnosis not present

## 2019-04-14 MED ORDER — OXYCODONE-ACETAMINOPHEN 5-325 MG PO TABS: 1.0000 | ORAL_TABLET | Freq: Every day | ORAL | 0 refills | Status: DC | PRN

## 2019-04-14 NOTE — Progress Notes (Signed)
Virtual Visit converted to telephone Note  This visit type was conducted due to national recommendations for restrictions regarding the COVID-19 pandemic (e.g. social distancing).  This format is felt to be most appropriate for this patient at this time.  All issues noted in this document were discussed and addressed.  No physical exam was performed (except for noted visual exam findings with Video Visits).   I connected with@ on 04/14/19 at 11:30 AM EDT  however  Interactive audio and video telecommunications  Ultimately failed, due to patient having technical difficulties.   We continued and completed visit with audio only. I and verified that I am speaking with the correct person using two identifiers.using two identifiers. Location patient: home Location provider: work or home office Persons participating in the virtual visit: patient, provider   Reason for visit: follow up on chronic pain management .  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  She has been having increased lower back and joint pain and has developed fusiform swelling of her fingers and dystrophic toenails.   Percocet qty was increased to #60/month at last visit and celebrex was refilled for bid use. Has been using it mostly once daily.  Getting ready to start ENbrel, but wants to delay until she can have her Shingles vaccine series      ROS: See pertinent positives and negatives per HPI.  Past Medical History:  Diagnosis Date  . Adenoma 10/06/2008   sigmoid 80mm  . ADHD (attention deficit hyperactivity disorder)   . Allergy   . Anxiety   . Arthritis   . Cataract    bil cateracts removed  . Complication of anesthesia    first colonoscopy pt states she woke up  . Depression   . GERD (gastroesophageal reflux disease)   . Globus sensation   . Hyperlipidemia   . Insomnia   . Multinodular goiter (nontoxic)   . Psoriatic arthritis ([place])   . Spinal  stenosis of lumbar region June 2016   MRI   . Statin intolerance 01/27/2013    Past Surgical History:  Procedure Laterality Date  . BACK SURGERY  02/17/2016   Dr. Jacqulynn Cadet- L4-L5 fusion   . BIOPSY THYROID  2014  . COLONOSCOPY W/ POLYPECTOMY    . EYE SURGERY     bilateral cataract surgery w/ lens implant  . FOOT SURGERY  2006   Right foot , secondary to severe loss of joint [facility])  . TONSILLECTOMY    . UPPER GASTROINTESTINAL ENDOSCOPY  2005   With empiric esophageal dilation    Family History  Problem Relation Age of Onset  . Heart disease Mother   . Hyperlipidemia Mother   . Coronary artery disease Father 88       CABG in early 74's  . Hyperlipidemia Father   . Epilepsy Grandchild        Severe form  . Colon cancer Neg Hx   . Esophageal cancer Neg Hx   . Rectal cancer Neg Hx   . Stomach cancer Neg Hx     SOCIAL HX:  reports that she quit smoking about 47 years ago. Her smoking use included cigarettes. She has never used smokeless tobacco. She reports current alcohol use of about 7.0 standard drinks of alcohol per week. She reports that she does not use drugs.   Current Outpatient Medications:  .  aspirin 81 MG tablet, Take 81 mg by mouth daily.  ,  Disp: , Rfl:  .  calcium-vitamin D (OSCAL WITH D) 250-125 MG-UNIT tablet, Take 1 tablet by mouth daily., Disp: , Rfl:  .  celecoxib (CELEBREX) 200 MG capsule, Take 200 mg by mouth 2 (two) times daily., Disp: , Rfl:  .  cholecalciferol (VITAMIN D) 1000 UNITS tablet, Take 1,000 Units by mouth daily.  , Disp: , Rfl:  .  clobetasol (TEMOVATE) 0.05 % external solution, APPLY TO AFFECTED AREAS ON SCALP TWICE DAILY UNTIL CLEAR, Disp: , Rfl:  .  diclofenac sodium (VOLTAREN) 1 % GEL, Apply 2 g topically 4 (four) times daily., Disp: 300 g, Rfl: 3 .  fluocinonide ointment (LIDEX) 0.05 %, APPLY TWICE DAILY TO AFFECTED AREAS UNTIL IMPROVED THEN AS NEEDED FOR FLARES, Disp: , Rfl:  .  FLUoxetine (PROZAC) 20 MG capsule, TAKE 1 CAPSULE BY MOUTH  EVERY DAY, Disp: 90 capsule, Rfl: 1 .  ketoconazole (NIZORAL) 2 % shampoo, PLEASE SEE ATTACHED FOR DETAILED DIRECTIONS, Disp: , Rfl:  .  Lactobacillus-Inulin ([place] PO), Take 1 tablet by mouth daily. , Disp: , Rfl:  .  lisdexamfetamine (VYVANSE) 50 MG capsule, Take 1 capsule (50 mg total) by mouth daily., Disp: 30 capsule, Rfl: 0 .  lisdexamfetamine (VYVANSE) 50 MG capsule, Take 1 capsule (50 mg total) by mouth daily., Disp: 30 capsule, Rfl: 0 .  lisdexamfetamine (VYVANSE) 50 MG capsule, Take 1 capsule (50 mg total) by mouth daily., Disp: 30 capsule, Rfl: 0 .  Multiple Vitamin (MULTIVITAMIN) tablet, Take 1 tablet by mouth daily., Disp: , Rfl:  .  Omega-3 Fatty Acids (FISH OIL) 1200 MG CAPS, Take 1,200 mg by mouth daily. , Disp: , Rfl:  .  [START ON 06/15/2019] oxyCODONE-acetaminophen (PERCOCET/ROXICET) 5-325 MG tablet, Take 1-2 tablets by mouth daily as needed for moderate pain., Disp: 60 tablet, Rfl: 0 .  [START ON 05/15/2019] oxyCODONE-acetaminophen (PERCOCET/ROXICET) 5-325 MG tablet, Take 1-2 tablets by mouth daily as needed for moderate pain., Disp: 60 tablet, Rfl: 0 .  oxyCODONE-acetaminophen (PERCOCET/ROXICET) 5-325 MG tablet, Take 1-2 tablets by mouth daily as needed for moderate pain., Disp: 60 tablet, Rfl: 0 .  Polyethyl Glycol-Propyl Glycol (SYSTANE ULTRA) 0.4-0.3 % SOLN, Place 1 drop into both eyes daily as needed (for dry eyes)., Disp: , Rfl:  .  triamcinolone cream (KENALOG) 0.1 %, APPLY TO AFFECTED AREA TWICE A DAY UNTIL CLEAR THEN AS NEEDED, Disp: , Rfl:  .  zolpidem (AMBIEN) 5 MG tablet, TAKE 1 TABLET BY MOUTH AT BEDTIME, Disp: 90 tablet, Rfl: 1  EXAM:  General impression: alert, cooperative and articulate.  No signs of being in distress  Lungs: speech is fluent sentence length suggests that patient is not short of breath and not punctuated by cough, sneezing or sniffing. Marland Kitchen   Psych: affect normal.  speech is articulate and non pressured .  Denies suicidal thoughts    ASSESSMENT AND PLAN:  Spondylolisthesis of lumbar region With persistent low back pain and right sided sciatica despite prior surgery.  Her pain is not relieved with NSAIDs or recent ESI. .   Management of her pain requires  refill on narcotics for twice daily  use . Marland Kitchen Refill history confirmed via [place] Controlled Substance database, accessed by me today..she has had no narcotics sent in by any other  Provider. .refills for July, August and September 2020  sent electronically    Psoriatic arthritis, destructive type Currently off Cosentyx due to intolerance and recent development of psoriasis diagnosed by dermatology ([place]) which was aggravated by use of  a steroid taper. She has had multiple other drug trials and is preparing to begin therapy with Enbrel.Continue celebrex qd-bid,  voltaren gel not more than twice daily ad increase percocet to #60/month for pain management.     I discussed the assessment and treatment plan with the patient. The patient was provided an opportunity to ask questions and all were answered. The patient agreed with the plan and demonstrated an understanding of the instructions.   The patient was advised to call back or seek an in-person evaluation if the symptoms worsen or if the condition fails to improve as anticipated.  I provided 22 minutes of non-face-to-face time during this encounter.   Crecencio Mc, MD

## 2019-04-15 NOTE — Assessment & Plan Note (Signed)
With persistent low back pain and right sided sciatica despite prior surgery.  Her pain is not relieved with NSAIDs or recent ESI. .   Management of her pain requires  refill on narcotics for twice daily  use . Marland Kitchen Refill history confirmed via [place] Controlled Substance database, accessed by me today..she has had no narcotics sent in by any other  Provider. .refills for July, August and September 2020  sent electronically

## 2019-04-15 NOTE — Assessment & Plan Note (Signed)
Currently off Cosentyx due to intolerance and recent development of psoriasis diagnosed by dermatology ([place]) which was aggravated by use of a steroid taper. She has had multiple other drug trials and is preparing to begin therapy with Enbrel.Continue celebrex qd-bid,  voltaren gel not more than twice daily ad increase percocet to #60/month for pain management.

## 2019-04-16 ENCOUNTER — Other Ambulatory Visit: Payer: Self-pay

## 2019-04-16 ENCOUNTER — Telehealth: Payer: Self-pay | Admitting: Psychiatry

## 2019-04-16 NOTE — Telephone Encounter (Signed)
Patient need refill voltigen Madeline Young) [facility] [address] in [place].  Need 3 month supplyt

## 2019-04-16 NOTE — Telephone Encounter (Signed)
Refill submitted per pharmacy request already

## 2019-04-17 ENCOUNTER — Ambulatory Visit: Payer: Medicare HMO | Admitting: Psychiatry

## 2019-05-19 ENCOUNTER — Telehealth: Payer: Self-pay | Admitting: Psychiatry

## 2019-05-19 ENCOUNTER — Other Ambulatory Visit: Payer: Self-pay

## 2019-05-19 DIAGNOSIS — Z79899 Other long term (current) drug therapy: Secondary | ICD-10-CM | POA: Diagnosis not present

## 2019-05-19 DIAGNOSIS — F900 Attention-deficit hyperactivity disorder, predominantly inattentive type: Secondary | ICD-10-CM

## 2019-05-19 DIAGNOSIS — L400 Psoriasis vulgaris: Secondary | ICD-10-CM | POA: Diagnosis not present

## 2019-05-19 DIAGNOSIS — D2262 Melanocytic nevi of left upper limb, including shoulder: Secondary | ICD-10-CM | POA: Diagnosis not present

## 2019-05-19 DIAGNOSIS — D225 Melanocytic nevi of trunk: Secondary | ICD-10-CM | POA: Diagnosis not present

## 2019-05-19 DIAGNOSIS — D2271 Melanocytic nevi of right lower limb, including hip: Secondary | ICD-10-CM | POA: Diagnosis not present

## 2019-05-19 DIAGNOSIS — D2272 Melanocytic nevi of left lower limb, including hip: Secondary | ICD-10-CM | POA: Diagnosis not present

## 2019-05-19 DIAGNOSIS — D2261 Melanocytic nevi of right upper limb, including shoulder: Secondary | ICD-10-CM | POA: Diagnosis not present

## 2019-05-19 MED ORDER — LISDEXAMFETAMINE DIMESYLATE 50 MG PO CAPS: 50.0000 mg | ORAL_CAPSULE | Freq: Every day | ORAL | 0 refills | Status: DC

## 2019-05-19 NOTE — Telephone Encounter (Signed)
Refill pended for approval.

## 2019-05-19 NOTE — Telephone Encounter (Signed)
Patient need refill on V[facility]vanse [facility] [place] on [address] patient has appt. 08/17

## 2019-05-22 DIAGNOSIS — H524 Presbyopia: Secondary | ICD-10-CM | POA: Diagnosis not present

## 2019-05-26 ENCOUNTER — Other Ambulatory Visit: Payer: Self-pay

## 2019-05-26 ENCOUNTER — Ambulatory Visit (INDEPENDENT_AMBULATORY_CARE_PROVIDER_SITE_OTHER): Payer: Medicare HMO | Admitting: Psychiatry

## 2019-05-26 ENCOUNTER — Encounter: Payer: Self-pay | Admitting: Psychiatry

## 2019-05-26 VITALS — BP 128/79 | HR 76

## 2019-05-26 DIAGNOSIS — F5105 Insomnia due to other mental disorder: Secondary | ICD-10-CM

## 2019-05-26 DIAGNOSIS — F3342 Major depressive disorder, recurrent, in full remission: Secondary | ICD-10-CM

## 2019-05-26 DIAGNOSIS — F900 Attention-deficit hyperactivity disorder, predominantly inattentive type: Secondary | ICD-10-CM | POA: Diagnosis not present

## 2019-05-26 DIAGNOSIS — R69 Illness, unspecified: Secondary | ICD-10-CM | POA: Diagnosis not present

## 2019-05-26 MED ORDER — LISDEXAMFETAMINE DIMESYLATE 50 MG PO CAPS: 50.0000 mg | ORAL_CAPSULE | Freq: Every day | ORAL | 0 refills | Status: DC

## 2019-05-26 NOTE — Progress Notes (Signed)
[name] [id number] 12-16-1955 63 y.o.  Subjective:   Patient ID:  Madeline Young is a 63 y.o. (DOB 11/17/1955) female.  Chief Complaint:  Chief Complaint  Patient presents with  . Follow-up    Medication Management  . ADD    Medication Management  . Depression    Medication Management  . Medication Refill    Vyvanse. Ambien, Prozac    Depression        Associated symptoms include no decreased concentration and no suicidal ideas.  Madeline Young presents to the office today for follow-up of ADD and anxiety and depression and sleep.  Last seen January 2020.  No meds were changed.  On Vyvanse 50, fluoxetine 20, and Ambien 5.  Bad skin reaction to Cosentys.  Severe sx.  Has psoriatic arthritis and needs a new med.  Plans Embrel.  Patient reports stable mood and denies depressed or irritable moods.  Patient denies any recent difficulty with anxiety.  Patient denies difficulty with sleep initiation or maintenance. Denies appetite disturbance.  Patient reports that energy and motivation have been good.  Patient still some difficulty with concentration.  Patient denies any suicidal ideation.  Chronic health problems and stressors.  D had a baby in December boy doing well except reflux.  Doing well with the meds.  Still has ADD and maybe worse. Scattered and hard to finish things.  Is terrrible if she forgets sleepy and worse with Doesn't finish things and forgets.  Tried NAC but didn't seem to help.   Otherwise mood ok.  Some increase in anxiety without any reason.  Not much caffeine.  Also lately EMA variably back to sleep.  Can feel off the next day.  Still some forgetfulness.  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  Helping to care for 68-month-old [place] in [place].  Past Psychiatric Medication Trials:  Zolpidem,  Fluoxetine 60, zertraline NR, duloxetine, Lexapro, buspirone, Ritalin LA, Concerta 54, Wellbutrin Under care at this  office since July 2002 Review of Systems:  Review of Systems  Musculoskeletal: Positive for arthralgias and back pain.  Skin: Positive for rash.  Neurological: Negative for tremors and weakness.  Psychiatric/Behavioral: Positive for depression and sleep disturbance. Negative for agitation, behavioral problems, confusion, decreased concentration, dysphoric mood, hallucinations, self-injury and suicidal ideas. The patient is not nervous/anxious and is not hyperactive.     Medications: I have reviewed the patient's current medications.  Current Outpatient Medications  Medication Sig Dispense Refill  . aspirin 81 MG tablet Take 81 mg by mouth daily.      . calcium-vitamin D (OSCAL WITH D) 250-125 MG-UNIT tablet Take 1 tablet by mouth daily.    . celecoxib (CELEBREX) 200 MG capsule Take 200 mg by mouth 2 (two) times daily.    . cholecalciferol (VITAMIN D) 1000 UNITS tablet Take 1,000 Units by mouth daily.      . clobetasol (TEMOVATE) 0.05 % external solution APPLY TO AFFECTED AREAS ON SCALP TWICE DAILY UNTIL CLEAR    . diclofenac sodium (VOLTAREN) 1 % GEL Apply 2 g topically 4 (four) times daily. 300 g 3  . fluocinonide ointment (LIDEX) 0.05 % APPLY TWICE DAILY TO AFFECTED AREAS UNTIL IMPROVED THEN AS NEEDED FOR FLARES    . FLUoxetine (PROZAC) 20 MG capsule TAKE 1 CAPSULE BY MOUTH EVERY DAY 90 capsule 1  . ketoconazole (NIZORAL) 2 % shampoo PLEASE SEE ATTACHED FOR DETAILED DIRECTIONS    . Lactobacillus-Inulin ([place]) Take  1 tablet by mouth daily.     Marland Kitchen lisdexamfetamine (VYVANSE) 50 MG capsule Take 1 capsule (50 mg total) by mouth daily. 30 capsule 0  . lisdexamfetamine (VYVANSE) 50 MG capsule Take 1 capsule (50 mg total) by mouth daily. 30 capsule 0  . lisdexamfetamine (VYVANSE) 50 MG capsule Take 1 capsule (50 mg total) by mouth daily. 30 capsule 0  . Multiple Vitamin (MULTIVITAMIN) tablet Take 1 tablet by mouth daily.    . Omega-3 Fatty Acids (FISH OIL) 1200 MG CAPS  Take 1,200 mg by mouth daily.     Derrill Memo ON 06/15/2019] oxyCODONE-acetaminophen (PERCOCET/ROXICET) 5-325 MG tablet Take 1-2 tablets by mouth daily as needed for moderate pain. 60 tablet 0  . oxyCODONE-acetaminophen (PERCOCET/ROXICET) 5-325 MG tablet Take 1-2 tablets by mouth daily as needed for moderate pain. 60 tablet 0  . oxyCODONE-acetaminophen (PERCOCET/ROXICET) 5-325 MG tablet Take 1-2 tablets by mouth daily as needed for moderate pain. 60 tablet 0  . Polyethyl Glycol-Propyl Glycol (SYSTANE ULTRA) 0.4-0.3 % SOLN Place 1 drop into both eyes daily as needed (for dry eyes).    . triamcinolone cream (KENALOG) 0.1 % APPLY TO AFFECTED AREA TWICE A DAY UNTIL CLEAR THEN AS NEEDED    . zolpidem (AMBIEN) 5 MG tablet TAKE 1 TABLET BY MOUTH AT BEDTIME 90 tablet 1   No current facility-administered medications for this visit.     Medication Side Effects: None  Allergies:  Allergies  Allergen Reactions  . Cosentyx [Secukinumab] Rash    Severe case of psorasis  . Sertraline Hcl Other (See Comments)    Made me crazy   . Avelox [Moxifloxacin Hcl In Nacl]     Pt cant remember reaction  . Latex Itching and Rash    Redness  . Macrobid [employer] Macro] Other (See Comments)    tired  . Statins Other (See Comments)    Muscle aches    Past Medical History:  Diagnosis Date  . Adenoma 10/06/2008   sigmoid 47mm  . ADHD (attention deficit hyperactivity disorder)   . Allergy   . Anxiety   . Arthritis   . Cataract    bil cateracts removed  . Complication of anesthesia    first colonoscopy pt states she woke up  . Depression   . GERD (gastroesophageal reflux disease)   . Globus sensation   . Hyperlipidemia   . Insomnia   . Multinodular goiter (nontoxic)   . Psoriatic arthritis ([place])   . Spinal stenosis of lumbar region June 2016   MRI   . Statin intolerance 01/27/2013    Family History  Problem Relation Age of Onset  . Heart disease Mother   . Hyperlipidemia Mother   .  Coronary artery disease Father 67       CABG in early 9's  . Hyperlipidemia Father   . Epilepsy Grandchild        Severe form  . Colon cancer Neg Hx   . Esophageal cancer Neg Hx   . Rectal cancer Neg Hx   . Stomach cancer Neg Hx     Social History   Socioeconomic History  . Marital status: Married    Spouse name: Not on file  . Number of children: 2  . Years of education: Not on file  . Highest education level: Not on file  Occupational History  . Occupation: disability  Social Needs  . Financial resource strain: Not hard at all  . Food insecurity    Worry: Never true  Inability: Never true  . Transportation needs    Medical: No    Non-medical: No  Tobacco Use  . Smoking status: Former Smoker    Types: Cigarettes    Quit date: 08/21/1971    Years since quitting: 47.7  . Smokeless tobacco: Never Used  Substance and Sexual Activity  . Alcohol use: Yes    Alcohol/week: 7.0 standard drinks    Types: 7 Glasses of wine per week  . Drug use: No  . Sexual activity: Not on file  Lifestyle  . Physical activity    Days per week: 2 days    Minutes per session: 50 min  . Stress: Not on file  Relationships  . Social Herbalist on phone: Not on file    Gets together: Not on file    Attends religious service: Not on file    Active member of club or organization: Not on file    Attends meetings of clubs or organizations: Not on file    Relationship status: Married  . Intimate partner violence    Fear of current or ex partner: No    Emotionally abused: No    Physically abused: No    Forced sexual activity: No  Other Topics Concern  . Not on file  Social History Narrative   Married, retired Therapist, sports   2 daughters some grandchildren   2 caffeinated drinks daily   1 alcoholic beverage daily   No tobacco   04/29/2015    Past Medical History, Surgical history, Social history, and Family history were reviewed and updated as appropriate.   Please see review of  systems for further details on the patient's review from today.   Objective:   Physical Exam:  BP 128/79   Pulse 76   Physical Exam Constitutional:      General: She is not in acute distress.    Appearance: Normal appearance. She is well-developed.  Musculoskeletal:        General: No deformity.  Neurological:     Mental Status: She is alert and oriented to person, place, and time.     Motor: No tremor.     Coordination: Coordination normal.     Gait: Gait normal.  Psychiatric:        Attention and Perception: Perception normal. She is inattentive.        Mood and Affect: Mood is not anxious or depressed. Affect is not labile, blunt, angry or inappropriate.        Speech: Speech normal.        Behavior: Behavior normal.        Thought Content: Thought content normal. Thought content does not include homicidal or suicidal ideation. Thought content does not include homicidal or suicidal plan.        Cognition and Memory: Cognition normal.        Judgment: Judgment normal.     Comments: Insight intact. No auditory or visual hallucinations. No delusions.  Talkative per usual.     Lab Review:     Component Value Date/Time   NA 139 01/13/2019 0932   K 3.8 01/13/2019 0932   CL 102 01/13/2019 0932   CO2 30 01/13/2019 0932   GLUCOSE 95 01/13/2019 0932   BUN 27 (H) 01/13/2019 0932   CREATININE 0.65 01/13/2019 0932   CALCIUM 9.2 01/13/2019 0932   PROT 6.5 01/13/2019 0932   ALBUMIN 4.2 01/13/2019 0932   AST 21 01/13/2019 0932   ALT 19 01/13/2019 0932  ALKPHOS 60 01/13/2019 0932   BILITOT 0.5 01/13/2019 0932   GFRNONAA >60 02/18/2016 0322   GFRAA >60 02/18/2016 0322       Component Value Date/Time   WBC 5.3 01/13/2019 0932   RBC 3.99 01/13/2019 0932   HGB 13.1 01/13/2019 0932   HCT 38.5 01/13/2019 0932   PLT 242.0 01/13/2019 0932   MCV 96.5 01/13/2019 0932   MCH 31.0 02/18/2016 0322   MCHC 34.0 01/13/2019 0932   RDW 13.0 01/13/2019 0932   LYMPHSABS 1.7 01/13/2019  0932   MONOABS 0.4 01/13/2019 0932   EOSABS 0.1 01/13/2019 0932   BASOSABS 0.0 01/13/2019 0932    No results found for: POCLITH, LITHIUM   No results found for: PHENYTOIN, PHENOBARB, VALPROATE, CBMZ   .res Assessment: Plan:    Madeline Young was seen today for follow-up, add, depression and medication refill.  Diagnoses and all orders for this visit:  Attention deficit hyperactivity disorder (ADHD), predominantly inattentive type  Major depression, recurrent, full remission ([place])  Insomnia due to mental condition   Good response to psych meds for depression ADD and sleep generally.  Still has significant ADD but she doesn't want to increase it. .   Able to get by with 5 mg Ambien usually.  Disc risk of Amnesia.  Disc pros/cons of switching stimulants or adjusting dose.  Disc SE and withdrawal and dosing options.  Would be best to control it. Option increase if needed.  Discussed potential benefits, risks, and side effects of stimulants with patient to include increased heart rate, palpitations, insomnia, increased anxiety, increased irritability, or decreased appetite.  Instructed patient to contact office if experiencing any significant tolerability issues.  Disc risk steroids affecting mood and sleep.  Supportive therapy dealing with stubborn H with memory problems.  No med changes today. Continue Vyvanse 50 mg AM Continue fluoxetine 20 mg daily Continue zolpidem 5 mg daily.  This appt was 30 mins.   FU 6 mos.  Lynder Parents, MD, DFAPA   Please see After Visit Summary for patient specific instructions.  Future Appointments  Date Time Provider [place]  07/16/2019 10:00 AM O'Brien-Blaney, Denisa L, LPN [facility]  23[birth year]74 10:30 AM Crecencio Mc, MD [facility]    No orders of the defined types were placed in this encounter.     -------------------------------

## 2019-05-27 DIAGNOSIS — Z0100 Encounter for examination of eyes and vision without abnormal findings: Secondary | ICD-10-CM | POA: Diagnosis not present

## 2019-05-29 ENCOUNTER — Other Ambulatory Visit: Payer: Self-pay

## 2019-05-29 DIAGNOSIS — Z20822 Contact with and (suspected) exposure to covid-19: Secondary | ICD-10-CM

## 2019-05-30 LAB — NOVEL CORONAVIRUS, NAA: SARS-CoV-2, NAA: NOT DETECTED

## 2019-06-09 DIAGNOSIS — R69 Illness, unspecified: Secondary | ICD-10-CM | POA: Diagnosis not present

## 2019-06-17 DIAGNOSIS — R69 Illness, unspecified: Secondary | ICD-10-CM | POA: Diagnosis not present

## 2019-06-19 ENCOUNTER — Telehealth: Payer: Self-pay | Admitting: Psychiatry

## 2019-06-19 ENCOUNTER — Other Ambulatory Visit: Payer: Self-pay

## 2019-06-19 DIAGNOSIS — F900 Attention-deficit hyperactivity disorder, predominantly inattentive type: Secondary | ICD-10-CM

## 2019-06-19 MED ORDER — LISDEXAMFETAMINE DIMESYLATE 50 MG PO CAPS: 50.0000 mg | ORAL_CAPSULE | Freq: Every day | ORAL | 0 refills | Status: DC

## 2019-06-19 NOTE — Telephone Encounter (Signed)
Pt is completely out of Vyvanse, and [facility]19. [facility] [place], [place]

## 2019-06-19 NOTE — Telephone Encounter (Signed)
Last refill 05/19/2019, pt is due for refill. New Rx dated 06/19/2019 fill today pended for approval

## 2019-07-09 ENCOUNTER — Other Ambulatory Visit: Payer: Self-pay | Admitting: Psychiatry

## 2019-07-16 ENCOUNTER — Ambulatory Visit: Payer: Medicare HMO | Admitting: Internal Medicine

## 2019-07-16 ENCOUNTER — Other Ambulatory Visit: Payer: Self-pay

## 2019-07-16 ENCOUNTER — Ambulatory Visit (INDEPENDENT_AMBULATORY_CARE_PROVIDER_SITE_OTHER): Payer: Medicare HMO

## 2019-07-16 DIAGNOSIS — Z0000 Encounter for general adult medical examination without abnormal findings: Secondary | ICD-10-CM

## 2019-07-16 NOTE — Patient Instructions (Addendum)
  Madeline Young , Thank you for taking time to come for your Medicare Wellness Visit. I appreciate your ongoing commitment to your health goals. Please review the following plan we discussed and let me know if I can assist you in the future.   These are the goals we discussed: Goals      Patient Stated   . DIET - INCREASE WATER INTAKE (pt-stated)       This is a list of the screening recommended for you and due dates:  Health Maintenance  Topic Date Due  . Pap Smear  01/07/2019  . Flu Shot  05/10/2019  . Mammogram  03/13/2020  . Colon Cancer Screening  05/03/2020  . Tetanus Vaccine  08/09/2022  .  Hepatitis C: One time screening is recommended by Center for Disease Control  (CDC) for  adults born from [birth year] through 1967.   Completed  . HIV Screening  Completed

## 2019-07-16 NOTE — Progress Notes (Signed)
Subjective:   Madeline Young is a 63 y.o. female who presents for Medicare Annual (Subsequent) preventive examination.  Review of Systems:  No ROS.  Medicare Wellness Virtual Visit.  Visual/audio telehealth visit, UTA vital signs.   See social history for additional risk factors.   Cardiac Risk Factors include: advanced age (>62men, >15 women)     Objective:     Vitals: There were no vitals taken for this visit.  There is no height or weight on file to calculate BMI.  Advanced Directives 07/16/2019 07/10/2018 03/17/2016 02/09/2016 02/10/2015  Does Patient Have a Medical Advance Directive? Yes Yes No No No  Type of Paramedic of [place];Living will [place];Living will - - -  Does patient want to make changes to medical advance directive? No - Patient declined No - Patient declined - - -  Copy of [place] in Chart? No - copy requested No - copy requested - - -  Would patient like information on creating a medical advance directive? - - No - patient declined information Yes - Scientist, clinical (histocompatibility and immunogenetics) given Yes - Scientist, clinical (histocompatibility and immunogenetics) given    Tobacco Social History   Tobacco Use  Smoking Status Former Smoker  . Types: Cigarettes  . Quit date: 08/21/1971  . Years since quitting: 47.9  Smokeless Tobacco Never Used     Counseling given: Not Answered   Clinical Intake:  Pre-visit preparation completed: Yes        Diabetes: No  How often do you need to have someone help you when you read instructions, pamphlets, or other written materials from your doctor or pharmacy?: 1 - Never  Interpreter Needed?: No     Past Medical History:  Diagnosis Date  . 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 ADHD (attention deficit hyperactivity disorder)   . Allergy   . Anxiety   . Arthritis   . Cataract    bil cateracts removed  . Complication of anesthesia    first colonoscopy pt states she woke up  . Depression   .  GERD (gastroesophageal reflux disease)   . Globus sensation   . Hyperlipidemia   . Insomnia   . Multinodular goiter (nontoxic)   . Psoriatic arthritis ([place])   . Spinal stenosis of lumbar region June 2016   MRI   . Statin intolerance 01/27/2013   Past Surgical History:  Procedure Laterality Date  . BACK SURGERY  02/17/2016   Dr. Jacqulynn Cadet- L4-L5 fusion   . BIOPSY THYROID  2014  . COLONOSCOPY W/ POLYPECTOMY    . EYE SURGERY     bilateral cataract surgery w/ lens implant  . FOOT SURGERY  2006   Right foot , secondary to severe loss of joint [facility])  . TONSILLECTOMY    . UPPER GASTROINTESTINAL ENDOSCOPY  2005   With empiric esophageal dilation   Family History  Problem Relation Age of Onset  . Heart disease Mother   . Hyperlipidemia Mother   . Coronary artery disease Father 28       CABG in early 37's  . Hyperlipidemia Father   . Epilepsy Grandchild        Severe form  . Colon cancer Neg Hx   . Esophageal cancer Neg Hx   . Rectal cancer Neg Hx   . Stomach cancer Neg Hx    Social History   Socioeconomic History  . Marital status: Married    Spouse name: Not on file  .  Number of children: 2  . Years of education: Not on file  . Highest education level: Not on file  Occupational History  . Occupation: disability  Social Needs  . Financial resource strain: Not hard at all  . Food insecurity    Worry: Never true    Inability: Never true  . Transportation needs    Medical: No    Non-medical: No  Tobacco Use  . Smoking status: Former Smoker    Types: Cigarettes    Quit date: 08/21/1971    Years since quitting: 47.9  . Smokeless tobacco: Never Used  Substance and Sexual Activity  . Alcohol use: Yes    Alcohol/week: 7.0 standard drinks    Types: 7 Glasses of wine per week  . Drug use: No  . Sexual activity: Not on file  Lifestyle  . Physical activity    Days per week: 2 days    Minutes per session: 50 min  . Stress: Not on file  Relationships  . Social  Herbalist on phone: Not on file    Gets together: Not on file    Attends religious service: Not on file    Active member of club or organization: Not on file    Attends meetings of clubs or organizations: Not on file    Relationship status: Married  Other Topics Concern  . Not on file  Social History Narrative   Married, retired Therapist, sports   2 daughters some grandchildren   2 caffeinated drinks daily   1 alcoholic beverage daily   No tobacco   04/29/2015    Outpatient Encounter Medications as of 07/16/2019  Medication Sig  . aspirin 81 MG tablet Take 81 mg by mouth daily.    . calcium-vitamin D (OSCAL WITH D) 250-125 MG-UNIT tablet Take 1 tablet by mouth daily.  . celecoxib (CELEBREX) 200 MG capsule Take 200 mg by mouth 2 (two) times daily.  . cholecalciferol (VITAMIN D) 1000 UNITS tablet Take 1,000 Units by mouth daily.    . clobetasol (TEMOVATE) 0.05 % external solution APPLY TO AFFECTED AREAS ON SCALP TWICE DAILY UNTIL CLEAR  . diclofenac sodium (VOLTAREN) 1 % GEL Apply 2 g topically 4 (four) times daily.  . fluocinonide ointment (LIDEX) 0.05 % APPLY TWICE DAILY TO AFFECTED AREAS UNTIL IMPROVED THEN AS NEEDED FOR FLARES  . FLUoxetine (PROZAC) 20 MG capsule TAKE 1 CAPSULE BY MOUTH EVERY DAY  . ketoconazole (NIZORAL) 2 % shampoo PLEASE SEE ATTACHED FOR DETAILED DIRECTIONS  . Lactobacillus-Inulin (CULTURELLE DIGESTIVE HEALTH PO) Take 1 tablet by mouth daily.   Marland Kitchen lisdexamfetamine (VYVANSE) 50 MG capsule Take 1 capsule (50 mg total) by mouth daily.  Derrill Memo ON 07/21/2019] lisdexamfetamine (VYVANSE) 50 MG capsule Take 1 capsule (50 mg total) by mouth daily.  Marland Kitchen lisdexamfetamine (VYVANSE) 50 MG capsule Take 1 capsule (50 mg total) by mouth daily.  . Multiple Vitamin (MULTIVITAMIN) tablet Take 1 tablet by mouth daily.  . Omega-3 Fatty Acids (FISH OIL) 1200 MG CAPS Take 1,200 mg by mouth daily.   Marland Kitchen oxyCODONE-acetaminophen (PERCOCET/ROXICET) 5-325 MG tablet Take 1-2 tablets by mouth  daily as needed for moderate pain.  Marland Kitchen oxyCODONE-acetaminophen (PERCOCET/ROXICET) 5-325 MG tablet Take 1-2 tablets by mouth daily as needed for moderate pain.  Marland Kitchen oxyCODONE-acetaminophen (PERCOCET/ROXICET) 5-325 MG tablet Take 1-2 tablets by mouth daily as needed for moderate pain.  Vladimir Faster Glycol-Propyl Glycol (SYSTANE ULTRA) 0.4-0.3 % SOLN Place 1 drop into both eyes daily as needed (for dry  eyes).  . triamcinolone cream (KENALOG) 0.1 % APPLY TO AFFECTED AREA TWICE A DAY UNTIL CLEAR THEN AS NEEDED  . zolpidem (AMBIEN) 5 MG tablet TAKE 1 TABLET BY MOUTH AT BEDTIME   No facility-administered encounter medications on file as of 07/16/2019.     Activities of Daily Living In your present state of health, do you have any difficulty performing the following activities: 07/16/2019  Hearing? N  Vision? N  Difficulty concentrating or making decisions? N  Walking or climbing stairs? N  Dressing or bathing? N  Doing errands, shopping? N  Preparing Food and eating ? N  Using the Toilet? N  In the past six months, have you accidently leaked urine? N  Do you have problems with loss of bowel control? N  Managing your Medications? N  Managing your Finances? N  Housekeeping or managing your Housekeeping? N  Some recent data might be hidden    Patient Care Team: Crecencio Mc, MD as PCP - General (Internal Medicine)    Assessment:   This is a routine wellness examination for Madeline Young.  I connected with patient 07/16/19 at 10:00 AM EDT by an audio enabled telemedicine application and verified that I am speaking with the correct person using two identifiers. Patient stated full name and DOB. Patient gave permission to continue with virtual visit. Patient's location was at home and Nurse's location was at [place] office.   Health Maintenance Due: -Pap smear- followed by pcp; considering no longer receiving due to aging out. Update all pending maintenance due as appropriate.   See completed HM  at the end of note.   Eye: Visual acuity not assessed. Virtual visit. Wears corrective lenses. Followed by their ophthalmologist every 12 months.   Dental: Visits every 6 months.    Hearing: Demonstrates normal hearing during visit.  Safety:  Patient feels safe at home- yes Patient does have smoke detectors at home- yes Patient does wear sunscreen or protective clothing when in direct sunlight - yes Patient does wear seat belt when in a moving vehicle - yes Patient drives- yes Adequate lighting in walkways free from debris- yes Grab bars and handrails used as appropriate- yes Ambulates with no assistive device Cell phone on person when ambulating outside of the home- yes  Social: Alcohol intake - yes      Smoking history- former    Smokers in home? none Illicit drug use? none  Depression: PHQ 2 &9 complete. See screening below. Denies irritability, anhedonia, sadness/tearfullness.  Stable.   Falls: See screening below.    Medication: Taking as directed and without issues.   Covid-19: Precautions and sickness symptoms discussed. Wears mask, social distancing, hand hygiene as appropriate.   Activities of Daily Living Patient denies needing assistance with: household chores, feeding themselves, getting from bed to chair, getting to the toilet, bathing/showering, dressing, managing money, or preparing meals.   Memory: Patient is alert. Patient denies difficulty focusing or concentrating. Correctly identified the president of the [place], season and recall.  BMI- discussed the importance of a healthy diet, water intake and the benefits of aerobic exercise.  Educational material provided.  Physical activity- walking her dog daily  Diet: limited red meat Water: good intake Caffeine: 2 cups coffee  Other Providers Patient Care Team: Crecencio Mc, MD as PCP - General (Internal Medicine)  Exercise Activities and Dietary recommendations    Goals      Patient Stated    . DIET - INCREASE WATER INTAKE (pt-stated)  Fall Risk Fall Risk  07/16/2019 07/10/2018 10/27/2016  Falls in the past year? 0 No Yes  Number falls in past yr: - - 2 or more  Injury with Fall? - - No   Timed Get Up and Go performed: no, virtual visit  Depression Screen PHQ 2/9 Scores 07/16/2019 07/10/2018 12/31/2017 10/27/2016  PHQ - 2 Score 0 0 2 3  PHQ- 9 Score - - 4 7     Cognitive Function MMSE - Mini Mental State Exam 07/10/2018  Orientation to time 5  Orientation to Place 5  Registration 3  Attention/ Calculation 5  Recall 3  Language- name 2 objects 2  Language- repeat 1  Language- follow 3 step command 3  Language- read & follow direction 1  Write a sentence 1  Copy design 1  Total score 30     6CIT Screen 07/16/2019  What Year? 0 points  What month? 0 points  What time? 0 points  Count back from 20 0 points  Months in reverse 0 points  Repeat phrase 0 points  Total Score 0    Immunization History  Administered Date(s) Administered  . Influenza Split 08/21/2011, 08/09/2012  . Influenza, High Dose Seasonal PF 07/10/2018, 06/09/2019  . Influenza-Unspecified 07/10/2016, 09/14/2017  . Pneumococcal Conjugate-13 07/17/2014  . Pneumococcal Polysaccharide-23 08/21/2011, 07/27/2016  . Tdap 08/09/2012  . Zoster 09/09/2016  . Zoster Recombinat (Shingrix) 06/09/2019   Screening Tests Health Maintenance  Topic Date Due  . PAP SMEAR-Modifier  01/07/2019  . INFLUENZA VACCINE  05/10/2019  . MAMMOGRAM  03/13/2020  . COLONOSCOPY  05/03/2020  . TETANUS/TDAP  08/09/2022  . Hepatitis C Screening  Completed  . HIV Screening  Completed      Plan:   Keep all routine maintenance appointments.   Follow up 08/07/19 @ 11:00  Medicare Attestation I have personally reviewed: The patient's medical and social history Their use of alcohol, tobacco or illicit drugs Their current medications and supplements The patient's functional ability including ADLs,fall risks,  home safety risks, cognitive, and hearing and visual impairment Diet and physical activities Evidence for depression   In addition, I have reviewed and discussed with patient certain preventive protocols, quality metrics, and best practice recommendations. A written personalized care plan for preventive services as well as general preventive health recommendations were provided to patient via mail.     Varney Biles, LPN  [id number]

## 2019-08-05 ENCOUNTER — Other Ambulatory Visit: Payer: Self-pay

## 2019-08-07 ENCOUNTER — Other Ambulatory Visit: Payer: Self-pay | Admitting: Internal Medicine

## 2019-08-07 ENCOUNTER — Other Ambulatory Visit: Payer: Self-pay

## 2019-08-07 ENCOUNTER — Ambulatory Visit (INDEPENDENT_AMBULATORY_CARE_PROVIDER_SITE_OTHER): Payer: Medicare HMO | Admitting: Internal Medicine

## 2019-08-07 ENCOUNTER — Encounter: Payer: Self-pay | Admitting: Internal Medicine

## 2019-08-07 VITALS — BP 128/66 | HR 82 | Temp 97.4°F | Resp 16 | Ht 64.5 in | Wt 170.8 lb

## 2019-08-07 DIAGNOSIS — L4052 Psoriatic arthritis mutilans: Secondary | ICD-10-CM

## 2019-08-07 DIAGNOSIS — Z7184 Encounter for health counseling related to travel: Secondary | ICD-10-CM | POA: Diagnosis not present

## 2019-08-07 DIAGNOSIS — M4316 Spondylolisthesis, lumbar region: Secondary | ICD-10-CM | POA: Diagnosis not present

## 2019-08-07 DIAGNOSIS — F5101 Primary insomnia: Secondary | ICD-10-CM | POA: Diagnosis not present

## 2019-08-07 DIAGNOSIS — R69 Illness, unspecified: Secondary | ICD-10-CM | POA: Diagnosis not present

## 2019-08-07 DIAGNOSIS — Z79899 Other long term (current) drug therapy: Secondary | ICD-10-CM

## 2019-08-07 LAB — CBC WITH DIFFERENTIAL/PLATELET
Basophils Absolute: 0 10*3/uL (ref 0.0–0.1)
Basophils Relative: 0.5 % (ref 0.0–3.0)
Eosinophils Absolute: 0.1 10*3/uL (ref 0.0–0.7)
Eosinophils Relative: 2.2 % (ref 0.0–5.0)
HCT: 38.3 % (ref 36.0–46.0)
Hemoglobin: 13 g/dL (ref 12.0–15.0)
Lymphocytes Relative: 45.3 % (ref 12.0–46.0)
Lymphs Abs: 2.1 10*3/uL (ref 0.7–4.0)
MCHC: 33.9 g/dL (ref 30.0–36.0)
MCV: 96.6 fl (ref 78.0–100.0)
Monocytes Absolute: 0.4 10*3/uL (ref 0.1–1.0)
Monocytes Relative: 9.2 % (ref 3.0–12.0)
Neutro Abs: 2 10*3/uL (ref 1.4–7.7)
Neutrophils Relative %: 42.8 % — ABNORMAL LOW (ref 43.0–77.0)
Platelets: 238 10*3/uL (ref 150.0–400.0)
RBC: 3.96 Mil/uL (ref 3.87–5.11)
RDW: 13 % (ref 11.5–15.5)
WBC: 4.6 10*3/uL (ref 4.0–10.5)

## 2019-08-07 LAB — COMPREHENSIVE METABOLIC PANEL
ALT: 13 U/L (ref 0–35)
AST: 18 U/L (ref 0–37)
Albumin: 4.3 g/dL (ref 3.5–5.2)
Alkaline Phosphatase: 49 U/L (ref 39–117)
BUN: 20 mg/dL (ref 6–23)
CO2: 32 mEq/L (ref 19–32)
Calcium: 9.9 mg/dL (ref 8.4–10.5)
Chloride: 101 mEq/L (ref 96–112)
Creatinine, Ser: 0.65 mg/dL (ref 0.40–1.20)
GFR: 91.49 mL/min (ref >60.00)
Glucose, Bld: 102 mg/dL — ABNORMAL HIGH (ref 70–99)
Potassium: 4 mEq/L (ref 3.5–5.1)
Sodium: 137 mEq/L (ref 135–145)
Total Bilirubin: 0.5 mg/dL (ref 0.2–1.2)
Total Protein: 6.9 g/dL (ref 6.0–8.3)

## 2019-08-07 MED ORDER — OXYCODONE-ACETAMINOPHEN 5-325 MG PO TABS: 1.0000 | ORAL_TABLET | Freq: Every day | ORAL | 0 refills | Status: DC | PRN

## 2019-08-07 MED ORDER — ZOLPIDEM TARTRATE ER 6.25 MG PO TBCR: 6.2500 mg | EXTENDED_RELEASE_TABLET | Freq: Every evening | ORAL | 0 refills | Status: DC | PRN

## 2019-08-07 NOTE — Telephone Encounter (Signed)
This was submitted by a different prescriber not Dr. Clovis Pu.

## 2019-08-07 NOTE — Telephone Encounter (Signed)
You sent this prescription bur the [facility] just called and said she accidentally deleted it.  Please send again.

## 2019-08-07 NOTE — Telephone Encounter (Signed)
[place] refill denied. Patient has received Lorrin Mais cr prescription today

## 2019-08-07 NOTE — Progress Notes (Signed)
Subjective:  Patient ID: Madeline Young, female    DOB: Mar 07, 1956  Age: 63 y.o. MRN: [id number]  CC: The primary encounter diagnosis was Long-term use of high-risk medication. 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  HPI Madeline Young presents for FOLLOW UP on multiple conditions including chronic pain from inflammatory arthritis    Arthritis :  Alternative immunotherapy  has been initiated recently.  TAKING ENBREL FOR THE PAST MONTH,  Has been having INCREASED BOWEL MOVEMENTS/UPSET STOMACH   not sleeping well ,  Waking up in moddle of night.  Taking ambien 5 mg .  No previous trial of ambien CR   Taking 2 oxycodone daily for management of pain along with celebrex  Going to [place] to visit family in the next several months.   Both children tested positive fo COVID after coming to [place] TO VISIT HER FROM OUT wEST.  THEY travelled  via car.  She is going out to [place] in a few weeks.  Back pain :  Persistent,  Feels that her  HANDS GETTING WEAKER. She was referred for a CT SCAN AT [facility] but the appointment was DELAYED BY COVID , but WANTS 2ND OPINION.  LAST CT LUMBAR SPINE WITH MYELOGRAM was  OCT 2018  AND CERVICAL WAS DONE AS WELL   LAST ESI WAS A DISASTER 6 MONTHS AGO by dr Maryjean Ka  at [facility]  neurologic    Outpatient Medications Prior to Visit  Medication Sig Dispense Refill  . aspirin 81 MG tablet Take 81 mg by mouth daily.      . calcium-vitamin D (OSCAL WITH D) 250-125 MG-UNIT tablet Take 1 tablet by mouth daily.    . celecoxib (CELEBREX) 200 MG capsule Take 200 mg by mouth 2 (two) times daily.    . cholecalciferol (VITAMIN D) 1000 UNITS tablet Take 1,000 Units by mouth daily.      . clobetasol (TEMOVATE) 0.05 % external solution APPLY TO AFFECTED AREAS ON SCALP TWICE DAILY UNTIL CLEAR    . diclofenac sodium (VOLTAREN) 1 % GEL Apply 2 g  topically 4 (four) times daily. 300 g 3  . Etanercept (ENBREL) 25 MG/0.5ML SOLN Inject into the skin.    . fluocinonide ointment (LIDEX) 0.05 % APPLY TWICE DAILY TO AFFECTED AREAS UNTIL IMPROVED THEN AS NEEDED FOR FLARES    . FLUoxetine (PROZAC) 20 MG capsule TAKE 1 CAPSULE BY MOUTH EVERY DAY 90 capsule 1  . Lactobacillus-Inulin (CULTURELLE DIGESTIVE HEALTH PO) Take 1 tablet by mouth daily.     Marland Kitchen lisdexamfetamine (VYVANSE) 50 MG capsule Take 1 capsule (50 mg total) by mouth daily. 30 capsule 0  . lisdexamfetamine (VYVANSE) 50 MG capsule Take 1 capsule (50 mg total) by mouth daily. 30 capsule 0  . lisdexamfetamine (VYVANSE) 50 MG capsule Take 1 capsule (50 mg total) by mouth daily. 30 capsule 0  . Multiple Vitamin (MULTIVITAMIN) tablet Take 1 tablet by mouth daily.    . Omega-3 Fatty Acids (FISH OIL) 1200 MG CAPS Take 1,200 mg by mouth daily.     Vladimir Faster Glycol-Propyl Glycol (SYSTANE ULTRA) 0.4-0.3 % SOLN Place 1 drop into both eyes daily as needed (for dry eyes).    . triamcinolone cream (KENALOG) 0.1 % APPLY TO AFFECTED AREA TWICE A DAY UNTIL CLEAR THEN AS NEEDED    . oxyCODONE-acetaminophen (PERCOCET/ROXICET) 5-325 MG tablet Take 1-2 tablets by mouth daily as needed for  moderate pain. 60 tablet 0  . oxyCODONE-acetaminophen (PERCOCET/ROXICET) 5-325 MG tablet Take 1-2 tablets by mouth daily as needed for moderate pain. 60 tablet 0  . oxyCODONE-acetaminophen (PERCOCET/ROXICET) 5-325 MG tablet Take 1-2 tablets by mouth daily as needed for moderate pain. 60 tablet 0  . zolpidem (AMBIEN) 5 MG tablet TAKE 1 TABLET BY MOUTH AT BEDTIME 90 tablet 1  . ketoconazole (NIZORAL) 2 % shampoo PLEASE SEE ATTACHED FOR DETAILED DIRECTIONS     No facility-administered medications prior to visit.     Review of Systems;  Patient denies headache, fevers, malaise, unintentional weight loss, skin rash, eye pain, sinus congestion and sinus pain, sore throat, dysphagia,  hemoptysis , cough, dyspnea, wheezing, chest  pain, palpitations, orthopnea, edema, abdominal pain, nausea, melena, diarrhea, constipation, flank pain, dysuria, hematuria, urinary  Frequency, nocturia, numbness, tingling, seizures,  Focal weakness, Loss of consciousness,  Tremor, insomnia, depression, anxiety, and suicidal ideation.      Objective:  BP 128/66 (BP Location: Left Arm, Patient Position: Sitting, Cuff Size: Normal)   Pulse 82   Temp (!) 97.4 F (36.3 C) (Temporal)   Resp 16   Ht 5' 4.5" (1.638 m)   Wt 170 lb 12.8 oz (77.5 kg)   SpO2 98%   BMI 28.86 kg/m   BP Readings from Last 3 Encounters:  08/07/19 128/66  10/14/18 (!) 112/58  07/10/18 110/64    Wt Readings from Last 3 Encounters:  08/07/19 170 lb 12.8 oz (77.5 kg)  10/14/18 177 lb 3.2 oz (80.4 kg)  07/10/18 172 lb 9.6 oz (78.3 kg)    General appearance: alert, cooperative and appears stated age Ears: normal TM's and external ear canals both ears Throat: lips, mucosa, and tongue normal; teeth and gums normal Neck: no adenopathy, no carotid bruit, supple, symmetrical, trachea midline and thyroid not enlarged, symmetric, no tenderness/mass/nodules Back: symmetric, no curvature. ROM normal. No CVA tenderness. Lungs: clear to auscultation bilaterally Heart: regular rate and rhythm, S1, S2 normal, no murmur, click, rub or gallop Abdomen: soft, non-tender; bowel sounds normal; no masses,  no organomegaly Pulses: 2+ and symmetric Skin: Skin color, texture, turgor normal. No rashes or lesions Lymph nodes: Cervical, supraclavicular, and axillary nodes normal.  No results found for: HGBA1C  Lab Results  Component Value Date   CREATININE 0.65 08/07/2019   CREATININE 0.65 01/13/2019   CREATININE 0.76 07/10/2018    Lab Results  Component Value Date   WBC 4.6 08/07/2019   HGB 13.0 08/07/2019   HCT 38.3 08/07/2019   PLT 238.0 08/07/2019   GLUCOSE 102 (H) 08/07/2019   CHOL 397 (H) 01/13/2019   TRIG 97.0 01/13/2019   HDL 56.10 01/13/2019   LDLDIRECT 252.3  06/12/2012   LDLCALC 322 (H) 01/13/2019   ALT 13 08/07/2019   AST 18 08/07/2019   NA 137 08/07/2019   K 4.0 08/07/2019   CL 101 08/07/2019   CREATININE 0.65 08/07/2019   BUN 20 08/07/2019   CO2 32 08/07/2019   TSH 1.02 01/13/2019    Mm 3d Screen Breast Bilateral  Result Date: 03/14/2019 CLINICAL DATA:  Screening. EXAM: DIGITAL SCREENING BILATERAL MAMMOGRAM WITH TOMO AND CAD COMPARISON:  Previous exam(s). ACR Breast Density Category b: There are scattered areas of fibroglandular density. FINDINGS: There are no findings suspicious for malignancy. Images were processed with CAD. IMPRESSION: No mammographic evidence of malignancy. A result letter of this screening mammogram will be mailed directly to the patient. RECOMMENDATION: Screening mammogram in one year. (Code:SM-B-01Y) BI-RADS CATEGORY  1: Negative.  Electronically Signed   By: Margarette Canada M.D.   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 She has had multiple other drug trials and has now begun  therapy with Enbrel.Continue celebrex qd-bid,  voltaren gel not more than twice daily ad continue  To allow use of percocet to #60/month for pain management.  She has not had any ER visits  And has not requested any early refills.  Her Refill history was confirmed via [place] Controlled Substance database by me today during her visit and there have been no prescriptions of controlled substances filled from any providers other than me. .       Relevant Medications   Etanercept (ENBREL) 25 MG/0.5ML SOLN   oxyCODONE-acetaminophen (PERCOCET/ROXICET) 5-325 MG tablet   oxyCODONE-acetaminophen (PERCOCET/ROXICET) 5-325 MG tablet (Start on 09/08/2019)   oxyCODONE-acetaminophen  (PERCOCET/ROXICET) 5-325 MG tablet (Start on 10/08/2019)   Travel advice encounter    She will nee to travel with her injectible Enbrel , and a prednisone taper.  Letter to airlines industry written for patient requesting permission to bring  injectible medication abroad      Spondylolisthesis of lumbar region    With persistent low back pain and right sided sciatica despite prior surgery.  Her pain is not relieved with NSAIDs or recent ESI. .   Management of her pain requires  refill on narcotics for twice daily  use . Marland Kitchen Refill history confirmed via [place] Controlled Substance database, accessed by me today..she has had no narcotics sent in by any other  Provider. .refills for Oct 31  Nov 30 and December 30 authorized and sent electronically        Insomnia    Chronic, with no improvement using over-the-counter first generation antihistamines. Reviewed principles of good sleep hygiene. Although she is a snorer, there is no report of apneic spells by husband. Previous use of Ambien  was ineffective. Trial of Ambien CR.       Other Visit Diagnoses    Long-term use of high-risk medication    -  Primary   Relevant Orders   Comprehensive metabolic panel (Completed)   CBC with Differential/Platelet (Completed)      I have discontinued Madeline D. Mcauliff "[name]"'s ketoconazole and zolpidem. I am also having her start on zolpidem. Additionally, I am having her maintain her aspirin, cholecalciferol, Fish Oil, Lactobacillus-Inulin (CULTURELLE DIGESTIVE HEALTH PO), Polyethyl Glycol-Propyl Glycol, calcium-vitamin D, multivitamin, celecoxib, fluocinonide ointment, triamcinolone cream, clobetasol, diclofenac sodium, lisdexamfetamine, lisdexamfetamine, lisdexamfetamine, FLUoxetine, Enbrel, oxyCODONE-acetaminophen, oxyCODONE-acetaminophen, and oxyCODONE-acetaminophen.  Meds ordered this encounter  Medications  . zolpidem (AMBIEN CR) 6.25 MG CR tablet    Sig: Take 1 tablet (6.25 mg total) by mouth at bedtime  as needed for sleep.    Dispense:  30 tablet    Refill:  0  . oxyCODONE-acetaminophen (PERCOCET/ROXICET) 5-325 MG tablet    Sig: Take 1-2 tablets by mouth daily as needed for moderate pain.    Dispense:  60 tablet    Refill:  0  . oxyCODONE-acetaminophen (PERCOCET/ROXICET) 5-325 MG tablet    Sig: Take 1-2 tablets by mouth daily as needed for moderate pain.    Dispense:  60 tablet    Refill:  0  .  oxyCODONE-acetaminophen (PERCOCET/ROXICET) 5-325 MG tablet    Sig: Take 1-2 tablets by mouth daily as needed for moderate pain.    Dispense:  60 tablet    Refill:  0    Medications Discontinued During This Encounter  Medication Reason  . ketoconazole (NIZORAL) 2 % shampoo Patient has not taken in last 30 days  . zolpidem (AMBIEN) 5 MG tablet   . oxyCODONE-acetaminophen (PERCOCET/ROXICET) 5-325 MG tablet Reorder  . oxyCODONE-acetaminophen (PERCOCET/ROXICET) 5-325 MG tablet Reorder  . oxyCODONE-acetaminophen (PERCOCET/ROXICET) 5-325 MG tablet Reorder    Follow-up: No follow-ups on file.   Crecencio Mc, MD

## 2019-08-07 NOTE — Patient Instructions (Addendum)
The risk of Enbrel plus prednisone is increased risk for infection.  If you get a back pain  Flare  You can take the prednisone  I will write the letter for the injectible medication carryon  For the Nov 19 trip   I have refilled the oxycodone for Oct 31

## 2019-08-07 NOTE — Telephone Encounter (Signed)
According to pharmacy pt's insurance is requiring a PA or a different medication to be sent in. Let me know if you would like the PA to be started.

## 2019-08-10 DIAGNOSIS — G4700 Insomnia, unspecified: Secondary | ICD-10-CM | POA: Insufficient documentation

## 2019-08-10 MED ORDER — OXYCODONE-ACETAMINOPHEN 5-325 MG PO TABS: 1.0000 | ORAL_TABLET | Freq: Every day | ORAL | 0 refills | Status: DC | PRN

## 2019-08-10 NOTE — Assessment & Plan Note (Signed)
Chronic, with no improvement using over-the-counter first generation antihistamines. Reviewed principles of good sleep hygiene. Although she is a snorer, there is no report of apneic spells by husband. Previous use of Ambien  was ineffective. Trial of Ambien CR.

## 2019-08-10 NOTE — Assessment & Plan Note (Signed)
She will nee to travel with her injectible Enbrel , and a prednisone taper.  Letter to airlines industry written for patient requesting permission to bring  injectible medication abroad

## 2019-08-10 NOTE — Assessment & Plan Note (Signed)
With persistent low back pain and right sided sciatica despite prior surgery.  Her pain is not relieved with NSAIDs or recent ESI. .   Management of her pain requires  refill on narcotics for twice daily  use . Marland Kitchen Refill history confirmed via  Controlled Substance database, accessed by me today..she has had no narcotics sent in by any other  Provider. .refills for Oct 31  Nov 30 and December 30 authorized and sent electronically

## 2019-08-10 NOTE — Assessment & Plan Note (Signed)
Currently off Cosentyx due to intolerance and recent development of psoriasis diagnosed by dermatology ([facility]) which was aggravated by use of a steroid taper. She has had multiple other drug trials and has now begun  therapy with Enbrel.Continue celebrex qd-bid,  voltaren gel not more than twice daily ad continue  To allow use of percocet to #60/month for pain management.  She has not had any ER visits  And has not requested any early refills.  Her Refill history was confirmed via [place] Controlled Substance database by me today during her visit and there have been no prescriptions of controlled substances filled from any providers other than me. Marland Kitchen

## 2019-09-10 ENCOUNTER — Other Ambulatory Visit: Payer: Self-pay | Admitting: Internal Medicine

## 2019-09-10 DIAGNOSIS — Z20828 Contact with and (suspected) exposure to other viral communicable diseases: Secondary | ICD-10-CM

## 2019-09-10 DIAGNOSIS — Z20822 Contact with and (suspected) exposure to covid-19: Secondary | ICD-10-CM

## 2019-09-12 ENCOUNTER — Other Ambulatory Visit: Payer: Medicare HMO

## 2019-09-15 ENCOUNTER — Other Ambulatory Visit: Payer: Self-pay

## 2019-09-17 ENCOUNTER — Other Ambulatory Visit: Payer: Self-pay

## 2019-09-17 ENCOUNTER — Other Ambulatory Visit (INDEPENDENT_AMBULATORY_CARE_PROVIDER_SITE_OTHER): Payer: Medicare HMO

## 2019-09-17 DIAGNOSIS — Z20822 Contact with and (suspected) exposure to covid-19: Secondary | ICD-10-CM

## 2019-09-17 DIAGNOSIS — Z20828 Contact with and (suspected) exposure to other viral communicable diseases: Secondary | ICD-10-CM | POA: Diagnosis not present

## 2019-09-17 LAB — SARS-COV-2 IGG: SARS-COV-2 IgG: 0.02

## 2019-09-17 NOTE — Addendum Note (Signed)
Addended by: Elpidio Galea T on: 09/17/2019 11:23 AM   Modules accepted: Orders

## 2019-09-30 DIAGNOSIS — M791 Myalgia, unspecified site: Secondary | ICD-10-CM | POA: Diagnosis not present

## 2019-09-30 DIAGNOSIS — M797 Fibromyalgia: Secondary | ICD-10-CM | POA: Diagnosis not present

## 2019-09-30 DIAGNOSIS — Z79899 Other long term (current) drug therapy: Secondary | ICD-10-CM | POA: Diagnosis not present

## 2019-09-30 DIAGNOSIS — M150 Primary generalized (osteo)arthritis: Secondary | ICD-10-CM | POA: Diagnosis not present

## 2019-09-30 DIAGNOSIS — L4050 Arthropathic psoriasis, unspecified: Secondary | ICD-10-CM | POA: Diagnosis not present

## 2019-09-30 DIAGNOSIS — R21 Rash and other nonspecific skin eruption: Secondary | ICD-10-CM | POA: Diagnosis not present

## 2019-09-30 DIAGNOSIS — M5136 Other intervertebral disc degeneration, lumbar region: Secondary | ICD-10-CM | POA: Diagnosis not present

## 2019-10-04 ENCOUNTER — Other Ambulatory Visit: Payer: Self-pay | Admitting: Psychiatry

## 2019-10-07 NOTE — Telephone Encounter (Signed)
Patient says that she needs the 5 mg of the ambien. She didn't like the 6.25

## 2019-10-07 NOTE — Telephone Encounter (Signed)
Pt stated she hasn't received her Ambien as of yet. [facility] it and she has this problem every time she is out. She is requesting a call back.

## 2019-10-15 ENCOUNTER — Ambulatory Visit: Payer: Medicare HMO | Attending: Internal Medicine

## 2019-10-15 DIAGNOSIS — Z20822 Contact with and (suspected) exposure to covid-19: Secondary | ICD-10-CM | POA: Diagnosis not present

## 2019-10-16 LAB — NOVEL CORONAVIRUS, NAA: SARS-CoV-2, NAA: NOT DETECTED

## 2019-10-20 ENCOUNTER — Other Ambulatory Visit: Payer: Self-pay

## 2019-10-20 ENCOUNTER — Telehealth: Payer: Self-pay | Admitting: Psychiatry

## 2019-10-20 DIAGNOSIS — F900 Attention-deficit hyperactivity disorder, predominantly inattentive type: Secondary | ICD-10-CM

## 2019-10-20 MED ORDER — LISDEXAMFETAMINE DIMESYLATE 50 MG PO CAPS: 50.0000 mg | ORAL_CAPSULE | Freq: Every day | ORAL | 0 refills | Status: DC

## 2019-10-20 NOTE — Telephone Encounter (Signed)
Patient called and said that she is out of her [facility]se [facility] [place] on [address]

## 2019-10-20 NOTE — Telephone Encounter (Signed)
Last refill 09/20/2019 Pended 2 Rx's till apt 11/26/2019 for Dr. Clovis Pu to submit

## 2019-11-01 ENCOUNTER — Ambulatory Visit: Payer: Medicare HMO | Attending: Internal Medicine

## 2019-11-01 DIAGNOSIS — Z23 Encounter for immunization: Secondary | ICD-10-CM

## 2019-11-01 NOTE — Progress Notes (Signed)
   Covid-19 Vaccination Clinic  Name:  Madeline Young    MRN: [id number] DOB: December 17, 1955  11/01/2019  Madeline Young was observed post Covid-19 immunization for 15 minutes without incidence. She was provided with Vaccine Information Sheet and instruction to access the V-Safe system.   Madeline Young was instructed to call 911 with any severe reactions post vaccine: Marland Kitchen Difficulty breathing  . Swelling of your face and throat  . A fast heartbeat  . A bad rash all over your body  . Dizziness and weakness    Immunizations Administered    Name Date Dose VIS Date Route   Pfizer COVID-19 Vaccine 11/01/2019  3:19 PM 0.3 mL 09/19/2019 Intramuscular   Manufacturer: [place]   Lot: [id number]   [place]: [id number]

## 2019-11-04 ENCOUNTER — Ambulatory Visit: Payer: Medicare HMO

## 2019-11-06 ENCOUNTER — Ambulatory Visit: Payer: Medicare HMO | Admitting: Internal Medicine

## 2019-11-10 ENCOUNTER — Encounter: Payer: Self-pay | Admitting: Internal Medicine

## 2019-11-10 ENCOUNTER — Ambulatory Visit (INDEPENDENT_AMBULATORY_CARE_PROVIDER_SITE_OTHER): Payer: Medicare HMO | Admitting: Internal Medicine

## 2019-11-10 ENCOUNTER — Other Ambulatory Visit: Payer: Self-pay

## 2019-11-10 DIAGNOSIS — J310 Chronic rhinitis: Secondary | ICD-10-CM | POA: Diagnosis not present

## 2019-11-10 DIAGNOSIS — H938X1 Other specified disorders of right ear: Secondary | ICD-10-CM | POA: Diagnosis not present

## 2019-11-10 DIAGNOSIS — L4052 Psoriatic arthritis mutilans: Secondary | ICD-10-CM | POA: Diagnosis not present

## 2019-11-10 DIAGNOSIS — M4316 Spondylolisthesis, lumbar region: Secondary | ICD-10-CM

## 2019-11-10 NOTE — Assessment & Plan Note (Signed)
Secondary to Enbrel.  Trial of benadryl

## 2019-11-10 NOTE — Progress Notes (Signed)
Virtual Visit via [email or web address]  This visit type was conducted due to national recommendations for restrictions regarding the COVID-19 pandemic (e.g. social distancing).  This format is felt to be most appropriate for this patient at this time.  All issues noted in this document were discussed and addressed.  No physical exam was performed (except for noted visual exam findings with Video Visits).   I connected with@ on 11/10/19 at 11:00 AM EST by a video enabled telemedicine application  and verified that I am speaking with the correct person using two identifiers. Location patient: home Location provider: work or home office Persons participating in the virtual visit: patient, provider  I discussed the limitations, risks, security and privacy concerns of performing an evaluation and management service by telephone and the availability of in person appointments. I also discussed with the patient that there may be a patient responsible charge related to this service. The patient expressed understanding and agreed to proceed.   Reason for visit: follow up  HPI:  Diffuse joint pain :  Has been managed with Embrel for the past 6  months .  Since starting it has been having nonpurulent rhinitis  On a persistent daily basis , accompanied by new onset headache and now persistent pressure in her right ear for several weeks that is getting worse. .tried taking sudafed for a few days  But not sure if it helped  Enbrel has been Prescribed by 4th rheumatologist Dr Dossie Der for inflammatory / psoriatic athritis. Prior trials of humira.    Discussed timing of 2nd covid vaccine  And  need to delay Enbrel injection to maximize her immune response to Vaccination   Losing dexterity and function of right hand.  Dropping things,   But can[t garden any more.  Still has lots of home projects,  But the loss of function is getting her depressed,  Not sleeping ,  Seeing [facility] next week to consider increaseing her prozac  Also not  sleeping well    ROS: See pertinent positives and negatives per HPI.  Past Medical History:  Diagnosis Date  . 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 ADHD (attention deficit hyperactivity disorder)   . Allergy   . Anxiety   . Arthritis   . Cataract    bil cateracts removed  . Complication of anesthesia    first colonoscopy pt states she woke up  . Depression   . GERD (gastroesophageal reflux disease)   . Globus sensation   . Hyperlipidemia   . Insomnia   . Multinodular goiter (nontoxic)   . Psoriatic arthritis ([place])   . Spinal stenosis of lumbar region June 2016   MRI   . Statin intolerance 01/27/2013    Past Surgical History:  Procedure Laterality Date  . BACK SURGERY  02/17/2016   Dr. Jacqulynn Cadet- L4-L5 fusion   . BIOPSY THYROID  2014  . COLONOSCOPY W/ POLYPECTOMY    . EYE SURGERY     bilateral cataract surgery w/ lens implant  . FOOT SURGERY  2006   Right foot , secondary to severe loss of joint [facility])  . TONSILLECTOMY    . UPPER GASTROINTESTINAL ENDOSCOPY  2005   With empiric esophageal dilation    Family History  Problem Relation Age of Onset  . Heart disease Mother   . Hyperlipidemia Mother   . Coronary artery disease Father 34       CABG in early 69's  . Hyperlipidemia Father   . Epilepsy Grandchild  Severe form  . Colon cancer Neg Hx   . Esophageal cancer Neg Hx   . Rectal cancer Neg Hx   . Stomach cancer Neg Hx     SOCIAL HX:  reports that she quit smoking about 48 years ago. Her smoking use included cigarettes. She has never used smokeless tobacco. She reports current alcohol use of about 7.0 standard drinks of alcohol per week. She reports that she does not use drugs.   Current Outpatient Medications:  .  aspirin 81 MG tablet, Take 81 mg by mouth daily.  , Disp: , Rfl:  .  calcium-vitamin D (OSCAL WITH D) 250-125 MG-UNIT tablet, Take 1 tablet by mouth daily., Disp: , Rfl:  .  celecoxib (CELEBREX) 200 MG capsule, Take 200 mg by mouth 2 (two)  times daily., Disp: , Rfl:  .  cholecalciferol (VITAMIN D) 1000 UNITS tablet, Take 1,000 Units by mouth daily.  , Disp: , Rfl:  .  diclofenac sodium (VOLTAREN) 1 % GEL, Apply 2 g topically 4 (four) times daily., Disp: 300 g, Rfl: 3 .  Etanercept (ENBREL) 25 MG/0.5ML SOLN, Inject into the skin., Disp: , Rfl:  .  FLUoxetine (PROZAC) 20 MG capsule, TAKE 1 CAPSULE BY MOUTH EVERY DAY, Disp: 90 capsule, Rfl: 1 .  Lactobacillus-Inulin ([place] PO), Take 1 tablet by mouth daily. , Disp: , Rfl:  .  lisdexamfetamine (VYVANSE) 50 MG capsule, Take 1 capsule (50 mg total) by mouth daily., Disp: 30 capsule, Rfl: 0 .  [START ON 11/17/2019] lisdexamfetamine (VYVANSE) 50 MG capsule, Take 1 capsule (50 mg total) by mouth daily., Disp: 30 capsule, Rfl: 0 .  lisdexamfetamine (VYVANSE) 50 MG capsule, Take 1 capsule (50 mg total) by mouth daily., Disp: 30 capsule, Rfl: 0 .  Multiple Vitamin (MULTIVITAMIN) tablet, Take 1 tablet by mouth daily., Disp: , Rfl:  .  Omega-3 Fatty Acids (FISH OIL) 1200 MG CAPS, Take 1,200 mg by mouth daily. , Disp: , Rfl:  .  oxyCODONE-acetaminophen (PERCOCET/ROXICET) 5-325 MG tablet, Take 1-2 tablets by mouth daily as needed for moderate pain., Disp: 60 tablet, Rfl: 0 .  oxyCODONE-acetaminophen (PERCOCET/ROXICET) 5-325 MG tablet, Take 1-2 tablets by mouth daily as needed for moderate pain., Disp: 60 tablet, Rfl: 0 .  oxyCODONE-acetaminophen (PERCOCET/ROXICET) 5-325 MG tablet, Take 1-2 tablets by mouth daily as needed for moderate pain., Disp: 60 tablet, Rfl: 0 .  Polyethyl Glycol-Propyl Glycol (SYSTANE ULTRA) 0.4-0.3 % SOLN, Place 1 drop into both eyes daily as needed (for dry eyes)., Disp: , Rfl:  .  triamcinolone cream (KENALOG) 0.1 %, APPLY TO AFFECTED AREA TWICE A DAY UNTIL CLEAR THEN AS NEEDED, Disp: , Rfl:  .  zolpidem (AMBIEN) 5 MG tablet, TAKE 1 TABLET BY MOUTH EVERYDAY AT BEDTIME, Disp: 90 tablet, Rfl: 0 .  clobetasol (TEMOVATE) 0.05 % external solution, APPLY TO  AFFECTED AREAS ON SCALP TWICE DAILY UNTIL CLEAR, Disp: , Rfl:  .  fluocinonide ointment (LIDEX) 0.05 %, APPLY TWICE DAILY TO AFFECTED AREAS UNTIL IMPROVED THEN AS NEEDED FOR FLARES, Disp: , Rfl:   EXAM:  VITALS per patient if applicable:  GENERAL: alert, oriented, appears well and in no acute distress  HEENT: atraumatic, conjunttiva clear, no obvious abnormalities on inspection of external nose and ears  NECK: normal movements of the head and neck  LUNGS: on inspection no signs of respiratory distress, breathing rate appears normal, no obvious gross SOB, gasping or wheezing  CV: no obvious cyanosis  MS: moves all visible extremities without noticeable  abnormality  PSYCH/NEURO: pleasant and cooperative, no obvious depression or anxiety, speech and thought processing grossly intact  ASSESSMENT AND PLAN:  Discussed the following assessment and plan:  Rhinitis, non-allergic  Ear pressure, right  Psoriatic arthritis, destructive type (HCC)  Spondylolisthesis of lumbar region  Rhinitis, non-allergic Secondary to Enbrel.  Trial of benadryl  Ear pressure, right May be due to PND<  No signs of otitis media.  .  Trial of Sudafed and benadryl for rhinitis. If mporved,  Will transition from sudafed and benadryl to atrovent and/or Flonase   Psoriatic arthritis, destructive type Had to stop  Cosentyx due to intolerance and recent development of psoriasis diagnosed by dermatology ([facility]) which was aggravated by use of a steroid taper. She has had multiple other drug trials and has now been taking Enbrel for the past 6 months. .Continue celebrex qd-bid,  voltaren gel not more than twice daily ad continue  To allow use of percocet to #60/month for pain management.  She has not had any ER visits  And has not requested any early refills.  Her Refill history was confirmed via [place] Controlled Substance database by me today during her visit and there have been no prescriptions of controlled  substances filled from any providers other than me. Marland Kitchen   Spondylolisthesis of lumbar region With persistent low back pain and right sided sciatica despite prior surgery.  Her pain is not relieved with NSAIDs or recent ESI. .   Management of her pain requires  refill on narcotics for twice daily  use . Marland Kitchen Refill history confirmed via [place] Controlled Substance database, accessed by me today..she has had no narcotics sent in by any other  Provider. .refills for Feburary march and April 2021 have  been authorized and sent electronically      I discussed the assessment and treatment plan with the patient. The patient was provided an opportunity to ask questions and all were answered. The patient agreed with the plan and demonstrated an understanding of the instructions.   The patient was advised to call back or seek an in-person evaluation if the symptoms worsen or if the condition fails to improve as anticipated.  I provided  30 minutes of non-face-to-face time during this encounter reviewing patient's current problems and past procedures/imaging studies, providing counseling on the above mentioned problems , and coordination  of care .  Crecencio Mc, MD

## 2019-11-10 NOTE — Assessment & Plan Note (Signed)
Had to stop  Cosentyx due to intolerance and recent development of psoriasis diagnosed by dermatology ([facility]) which was aggravated by use of a steroid taper. She has had multiple other drug trials and has now been taking Enbrel for the past 6 months. .Continue celebrex qd-bid,  voltaren gel not more than twice daily ad continue  To allow use of percocet to #60/month for pain management.  She has not had any ER visits  And has not requested any early refills.  Her Refill history was confirmed via [place] Controlled Substance database by me today during her visit and there have been no prescriptions of controlled substances filled from any providers other than me. Marland Kitchen

## 2019-11-10 NOTE — Assessment & Plan Note (Signed)
With persistent low back pain and right sided sciatica despite prior surgery.  Her pain is not relieved with NSAIDs or recent ESI. .   Management of her pain requires  refill on narcotics for twice daily  use . Marland Kitchen Refill history confirmed via [place] Controlled Substance database, accessed by me today..she has had no narcotics sent in by any other  Provider. .refills for Feburary march and April 2021 have  been authorized and sent electronically

## 2019-11-10 NOTE — Assessment & Plan Note (Addendum)
May be due to PND<  No signs of otitis media.  .  Trial of Sudafed and benadryl for rhinitis. If mporved,  Will transition from sudafed and benadryl to atrovent and/or Flonase

## 2019-11-14 ENCOUNTER — Telehealth: Payer: Self-pay | Admitting: Internal Medicine

## 2019-11-14 NOTE — Telephone Encounter (Signed)
Pt needs a refill oxyCODONE-acetaminophen (PERCOCET/ROXICET) 5-325 MG tablet sent to [facility]

## 2019-11-14 NOTE — Telephone Encounter (Signed)
Refilled: 10/08/2019 Last OV: 11/10/2019 Next OV: not scheduled

## 2019-11-15 MED ORDER — OXYCODONE-ACETAMINOPHEN 5-325 MG PO TABS: 1.0000 | ORAL_TABLET | Freq: Every day | ORAL | 0 refills | Status: DC | PRN

## 2019-11-17 NOTE — Telephone Encounter (Signed)
Spoke with Caryl Pina in the front office and she stated that she would let the pt know that the medication was sent in on 11/15/2019.

## 2019-11-17 NOTE — Telephone Encounter (Signed)
Pt called to check on the rx and would like a call back

## 2019-11-19 DIAGNOSIS — X32XXXA Exposure to sunlight, initial encounter: Secondary | ICD-10-CM | POA: Diagnosis not present

## 2019-11-19 DIAGNOSIS — D2261 Melanocytic nevi of right upper limb, including shoulder: Secondary | ICD-10-CM | POA: Diagnosis not present

## 2019-11-19 DIAGNOSIS — D225 Melanocytic nevi of trunk: Secondary | ICD-10-CM | POA: Diagnosis not present

## 2019-11-19 DIAGNOSIS — L400 Psoriasis vulgaris: Secondary | ICD-10-CM | POA: Diagnosis not present

## 2019-11-19 DIAGNOSIS — Z79899 Other long term (current) drug therapy: Secondary | ICD-10-CM | POA: Diagnosis not present

## 2019-11-19 DIAGNOSIS — L821 Other seborrheic keratosis: Secondary | ICD-10-CM | POA: Diagnosis not present

## 2019-11-19 DIAGNOSIS — L820 Inflamed seborrheic keratosis: Secondary | ICD-10-CM | POA: Diagnosis not present

## 2019-11-19 DIAGNOSIS — L570 Actinic keratosis: Secondary | ICD-10-CM | POA: Diagnosis not present

## 2019-11-19 DIAGNOSIS — L538 Other specified erythematous conditions: Secondary | ICD-10-CM | POA: Diagnosis not present

## 2019-11-19 DIAGNOSIS — D2262 Melanocytic nevi of left upper limb, including shoulder: Secondary | ICD-10-CM | POA: Diagnosis not present

## 2019-11-21 ENCOUNTER — Ambulatory Visit: Payer: Medicare HMO

## 2019-11-23 ENCOUNTER — Ambulatory Visit: Payer: Medicare HMO | Attending: Internal Medicine

## 2019-11-23 DIAGNOSIS — Z23 Encounter for immunization: Secondary | ICD-10-CM | POA: Insufficient documentation

## 2019-11-23 NOTE — Progress Notes (Signed)
   Covid-19 Vaccination Clinic  Name:  Madeline Young    MRN: [id number] DOB: [birth year]  11/23/2019  [name] was observed post Covid-19 immunization for 15 minutes without incidence. She was provided with Vaccine Information Sheet and instruction to access the V-Safe system.   [name] was instructed to call 911 with any severe reactions post vaccine: Marland Kitchen Difficulty breathing  . Swelling of your face and throat  . A fast heartbeat  . A bad rash all over your body  . Dizziness and weakness    Immunizations Administered    Name Date Dose VIS Date Route   Pfizer COVID-19 Vaccine 11/23/2019 12:47 PM 0.3 mL 09/19/2019 Intramuscular   Manufacturer: [place]   Lot: [id number]   [place]: [id number]

## 2019-11-24 ENCOUNTER — Telehealth: Payer: Self-pay

## 2019-11-24 NOTE — Telephone Encounter (Signed)
Medication has been approved through 10/08/2020. 

## 2019-11-24 NOTE — Telephone Encounter (Signed)
PA for Diclofenac gel has been submitted on covermymeds.

## 2019-11-26 ENCOUNTER — Other Ambulatory Visit: Payer: Self-pay | Admitting: Psychiatry

## 2019-11-26 ENCOUNTER — Ambulatory Visit (INDEPENDENT_AMBULATORY_CARE_PROVIDER_SITE_OTHER): Payer: Medicare HMO | Admitting: Psychiatry

## 2019-11-26 ENCOUNTER — Encounter: Payer: Self-pay | Admitting: Psychiatry

## 2019-11-26 ENCOUNTER — Other Ambulatory Visit: Payer: Self-pay

## 2019-11-26 DIAGNOSIS — F900 Attention-deficit hyperactivity disorder, predominantly inattentive type: Secondary | ICD-10-CM | POA: Diagnosis not present

## 2019-11-26 DIAGNOSIS — F3342 Major depressive disorder, recurrent, in full remission: Secondary | ICD-10-CM | POA: Diagnosis not present

## 2019-11-26 DIAGNOSIS — F5105 Insomnia due to other mental disorder: Secondary | ICD-10-CM

## 2019-11-26 DIAGNOSIS — R69 Illness, unspecified: Secondary | ICD-10-CM | POA: Diagnosis not present

## 2019-11-26 MED ORDER — ZOLPIDEM TARTRATE 5 MG PO TABS: 5.0000 mg | ORAL_TABLET | Freq: Every day | ORAL | 0 refills | Status: DC

## 2019-11-26 MED ORDER — LISDEXAMFETAMINE DIMESYLATE 50 MG PO CAPS: 50.0000 mg | ORAL_CAPSULE | Freq: Every day | ORAL | 0 refills | Status: DC

## 2019-11-26 MED ORDER — FLUOXETINE HCL 10 MG PO CAPS: 10.0000 mg | ORAL_CAPSULE | Freq: Every day | ORAL | 0 refills | Status: DC

## 2019-11-26 MED ORDER — TRAZODONE HCL 50 MG PO TABS: 50.0000 mg | ORAL_TABLET | Freq: Every day | ORAL | 1 refills | Status: DC

## 2019-11-26 NOTE — Progress Notes (Signed)
EMRA LIVSHITS [id number] 01-25-1956 64 y.o.  Subjective:   Patient ID:  Madeline Young is a 64 y.o. (DOB 1956/09/25) female.  Chief Complaint:  Chief Complaint  Patient presents with  . Follow-up  . Depression  . ADD    Depression        Associated symptoms include no decreased concentration and no suicidal ideas.  Temperence D Twining presents to the office today for follow-up of ADD and anxiety and depression and sleep.  Last seen August 2020.  No meds were changed.  On Vyvanse 50, fluoxetine 20, and Ambien 5.  "Down".  Doesn't feel Vyvanse helping as much.  More scattered and doesn't finish things.  No SE with it. Sleep pattern is worse as Covid progressed.  Had reduced Zolpidem to 5 mg but then increased to 7.5 mg HS.  If takes Benadryl but can sleep but then has crying spells after it and hangover.  In the AM feels more negative and tearful.  AM is hard bc stiff in the morning.  Bad skin reaction to Cosentys.  Severe sx.  Has psoriatic arthritis and needs a new med.  Using Embrel.  Patient reports stable mood and denies irritable moods.  Patient denies any recent difficulty with anxiety.  Patient denies difficulty with sleep initiation or maintenance. Denies appetite disturbance.  Patient reports that energy and motivation have been good.  Patient still some difficulty with concentration.  Patient denies any suicidal ideation.  Chronic health problems and stressors.  D had a baby in December boy doing well except reflux.  Still has ADD and maybe worse. Scattered and hard to finish things.  Is terrrible if she forgets sleepy and worse with Doesn't finish things and forgets.  Tried NAC but didn't seem to help.   Otherwise mood ok.  Some increase in anxiety without any reason.  Not much caffeine.  Also lately EMA variably back to sleep.  Can feel off the next day.  Still some forgetfulness.  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  Helping to  care for 12-month-old [place] in [place].  Past Psychiatric Medication Trials:  Zolpidem,  Fluoxetine 60, zertraline NR, duloxetine, Lexapro, Wellbutrin, buspirone,  Ritalin LA, Concerta 54, Adderall 20 dizzy Under care at this office since July 2002  Review of Systems:  Review of Systems  Musculoskeletal: Positive for arthralgias and back pain.  Skin: Positive for rash.  Neurological: Negative for tremors and weakness.  Psychiatric/Behavioral: Positive for depression and sleep disturbance. Negative for agitation, behavioral problems, confusion, decreased concentration, dysphoric mood, hallucinations, self-injury and suicidal ideas. The patient is not nervous/anxious and is not hyperactive.     Medications: I have reviewed the patient's current medications.  Current Outpatient Medications  Medication Sig Dispense Refill  . aspirin 81 MG tablet Take 81 mg by mouth daily.      . calcium-vitamin D (OSCAL WITH D) 250-125 MG-UNIT tablet Take 1 tablet by mouth daily.    . celecoxib (CELEBREX) 200 MG capsule Take 200 mg by mouth 2 (two) times daily.    . cholecalciferol (VITAMIN D) 1000 UNITS tablet Take 1,000 Units by mouth daily.      . clobetasol (TEMOVATE) 0.05 % external solution APPLY TO AFFECTED AREAS ON SCALP TWICE DAILY UNTIL CLEAR    . diclofenac sodium (VOLTAREN) 1 % GEL Apply 2 g topically 4 (four) times daily. 300 g 3  . Etanercept (ENBREL) 25 MG/0.5ML SOLN Inject into the skin.    Marland Kitchen  fluocinonide ointment (LIDEX) 0.05 % APPLY TWICE DAILY TO AFFECTED AREAS UNTIL IMPROVED THEN AS NEEDED FOR FLARES    . FLUoxetine (PROZAC) 20 MG capsule TAKE 1 CAPSULE BY MOUTH EVERY DAY 90 capsule 1  . Lactobacillus-Inulin (CULTURELLE DIGESTIVE HEALTH PO) Take 1 tablet by mouth daily.     Marland Kitchen lisdexamfetamine (VYVANSE) 50 MG capsule Take 1 capsule (50 mg total) by mouth daily. 30 capsule 0  . lisdexamfetamine (VYVANSE) 50 MG capsule Take 1 capsule (50 mg total) by mouth daily. 30 capsule 0  .  lisdexamfetamine (VYVANSE) 50 MG capsule Take 1 capsule (50 mg total) by mouth daily. 30 capsule 0  . Multiple Vitamin (MULTIVITAMIN) tablet Take 1 tablet by mouth daily.    . Omega-3 Fatty Acids (FISH OIL) 1200 MG CAPS Take 1,200 mg by mouth daily.     Marland Kitchen oxyCODONE-acetaminophen (PERCOCET/ROXICET) 5-325 MG tablet Take 1-2 tablets by mouth daily as needed for moderate pain. 60 tablet 0  . oxyCODONE-acetaminophen (PERCOCET/ROXICET) 5-325 MG tablet Take 1-2 tablets by mouth daily as needed for moderate pain. 60 tablet 0  . oxyCODONE-acetaminophen (PERCOCET/ROXICET) 5-325 MG tablet Take 1-2 tablets by mouth daily as needed for moderate pain. 60 tablet 0  . Polyethyl Glycol-Propyl Glycol (SYSTANE ULTRA) 0.4-0.3 % SOLN Place 1 drop into both eyes daily as needed (for dry eyes).    . triamcinolone cream (KENALOG) 0.1 % APPLY TO AFFECTED AREA TWICE A DAY UNTIL CLEAR THEN AS NEEDED    . zolpidem (AMBIEN) 5 MG tablet TAKE 1 TABLET BY MOUTH EVERYDAY AT BEDTIME 90 tablet 0   No current facility-administered medications for this visit.    Medication Side Effects: None  Allergies:  Allergies  Allergen Reactions  . Cosentyx [Secukinumab] Rash    Severe case of psorasis  . Sertraline Hcl Other (See Comments)    Made me crazy   . Avelox [Moxifloxacin Hcl In Nacl]     Pt cant remember reaction  . Latex Itching and Rash    Redness  . Macrobid [employer] Macro] Other (See Comments)    tired  . Statins Other (See Comments)    Muscle aches    Past Medical History:  Diagnosis Date  . 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 ADHD (attention deficit hyperactivity disorder)   . Allergy   . Anxiety   . Arthritis   . Cataract    bil cateracts removed  . Complication of anesthesia    first colonoscopy pt states she woke up  . Depression   . GERD (gastroesophageal reflux disease)   . Globus sensation   . Hyperlipidemia   . Insomnia   . Multinodular goiter (nontoxic)   . Psoriatic  arthritis ([place])   . Spinal stenosis of lumbar region June 2016   MRI   . Statin intolerance 01/27/2013    Family History  Problem Relation Age of Onset  . Heart disease Mother   . Hyperlipidemia Mother   . Coronary artery disease Father 65       CABG in early 65's  . Hyperlipidemia Father   . Epilepsy Grandchild        Severe form  . Colon cancer Neg Hx   . Esophageal cancer Neg Hx   . Rectal cancer Neg Hx   . Stomach cancer Neg Hx     Social History   Socioeconomic History  . Marital status: Married    Spouse name: Not on file  . Number of children: 2  . Years  of education: Not on file  . Highest education level: Not on file  Occupational History  . Occupation: disability  Tobacco Use  . Smoking status: Former Smoker    Types: Cigarettes    Quit date: 08/21/1971    Years since quitting: 48.2  . Smokeless tobacco: Never Used  Substance and Sexual Activity  . Alcohol use: Yes    Alcohol/week: 7.0 standard drinks    Types: 7 Glasses of wine per week  . Drug use: No  . Sexual activity: Not on file  Other Topics Concern  . Not on file  Social History Narrative   Married, retired Therapist, sports   2 daughters some grandchildren   2 caffeinated drinks daily   1 alcoholic beverage daily   No tobacco   04/29/2015   Social Determinants of Health   Financial Resource Strain:   . Difficulty of Paying Living Expenses: Not on file  Food Insecurity:   . Worried About Charity fundraiser in the Last Year: Not on file  . Ran Out of Food in the Last Year: Not on file  Transportation Needs:   . Lack of Transportation (Medical): Not on file  . Lack of Transportation (Non-Medical): Not on file  Physical Activity:   . Days of Exercise per Week: Not on file  . Minutes of Exercise per Session: Not on file  Stress:   . Feeling of Stress : Not on file  Social Connections:   . Frequency of Communication with Friends and Family: Not on file  . Frequency of Social Gatherings with Friends  and Family: Not on file  . Attends Religious Services: Not on file  . Active Member of Clubs or Organizations: Not on file  . Attends Archivist Meetings: Not on file  . Marital Status: Not on file  Intimate Partner Violence:   . Fear of Current or Ex-Partner: Not on file  . Emotionally Abused: Not on file  . Physically Abused: Not on file  . Sexually Abused: Not on file    Past Medical History, Surgical history, Social history, and Family history were reviewed and updated as appropriate.   Please see review of systems for further details on the patient's review from today.   Objective:   Physical Exam:  There were no vitals taken for this visit.  Physical Exam Constitutional:      General: She is not in acute distress.    Appearance: Normal appearance. She is well-developed.  Musculoskeletal:        General: No deformity.  Neurological:     Mental Status: She is alert and oriented to person, place, and time.     Motor: No tremor.     Coordination: Coordination normal.     Gait: Gait normal.  Psychiatric:        Attention and Perception: Perception normal. She is inattentive.        Mood and Affect: Mood is depressed. Mood is not anxious. Affect is not labile, blunt, angry or inappropriate.        Speech: Speech normal.        Behavior: Behavior normal.        Thought Content: Thought content normal. Thought content does not include homicidal or suicidal ideation. Thought content does not include homicidal or suicidal plan.        Cognition and Memory: Cognition normal.        Judgment: Judgment normal.     Comments: Insight intact. No auditory or  visual hallucinations. No delusions.  Talkative per usual.     Lab Review:     Component Value Date/Time   NA 137 08/07/2019 1159   K 4.0 08/07/2019 1159   CL 101 08/07/2019 1159   CO2 32 08/07/2019 1159   GLUCOSE 102 (H) 08/07/2019 1159   BUN 20 08/07/2019 1159   CREATININE 0.65 08/07/2019 1159   CALCIUM  9.9 08/07/2019 1159   PROT 6.9 08/07/2019 1159   ALBUMIN 4.3 08/07/2019 1159   AST 18 08/07/2019 1159   ALT 13 08/07/2019 1159   ALKPHOS 49 08/07/2019 1159   BILITOT 0.5 08/07/2019 1159   GFRNONAA >60 02/18/2016 0322   GFRAA >60 02/18/2016 0322       Component Value Date/Time   WBC 4.6 08/07/2019 1159   RBC 3.96 08/07/2019 1159   HGB 13.0 08/07/2019 1159   HCT 38.3 08/07/2019 1159   PLT 238.0 08/07/2019 1159   MCV 96.6 08/07/2019 1159   MCH 31.0 02/18/2016 0322   MCHC 33.9 08/07/2019 1159   RDW 13.0 08/07/2019 1159   LYMPHSABS 2.1 08/07/2019 1159   MONOABS 0.4 08/07/2019 1159   EOSABS 0.1 08/07/2019 1159   BASOSABS 0.0 08/07/2019 1159    No results found for: POCLITH, LITHIUM   No results found for: PHENYTOIN, PHENOBARB, VALPROATE, CBMZ   .res Assessment: Plan:    Zhavia was seen today for follow-up, depression and add.  Diagnoses and all orders for this visit:  Attention deficit hyperactivity disorder (ADHD), predominantly inattentive type  Major depression, recurrent, full remission ([address])  Insomnia due to mental condition   Less response to psych meds for depression ADD and sleep generally.  Still has significant ADD and consider increase it, if the increase fluoxetine does not help.   Able to get by with 5 mg Ambien usually.  Disc risk of Amnesia.  Disc pros/cons of switching stimulants or adjusting dose.  Disc SE and withdrawal and dosing options.  Would be best to control it. Option increase if needed.  Discussed potential benefits, risks, and side effects of stimulants with patient to include increased heart rate, palpitations, insomnia, increased anxiety, increased irritability, or decreased appetite.  Instructed patient to contact office if experiencing any significant tolerability issues.  Disc risk steroids affecting mood and sleep.  Supportive therapy dealing with stubborn H with memory problems.  Continue Vyvanse 50 mg AM Increase fluoxetine  to 30 mg daily.  She doesn't want to go to 40 bc fear of serotonin syndrome.  Thinks she had some of those sx in the past Continue zolpidem 5 mg daily.  This appt was 30 mins.   FU 6 mos.  Lynder Parents, MD, DFAPA   Please see After Visit Summary for patient specific instructions.  Future Appointments  Date Time Provider [place]  07/16/2020 10:00 AM O'Brien-Blaney, Denisa L, LPN [id number]    No orders of the defined types were placed in this encounter.     -------------------------------

## 2019-11-29 DIAGNOSIS — H11422 Conjunctival edema, left eye: Secondary | ICD-10-CM | POA: Diagnosis not present

## 2019-12-03 ENCOUNTER — Ambulatory Visit: Payer: Medicare HMO

## 2019-12-16 DIAGNOSIS — M5136 Other intervertebral disc degeneration, lumbar region: Secondary | ICD-10-CM | POA: Diagnosis not present

## 2019-12-16 DIAGNOSIS — L4050 Arthropathic psoriasis, unspecified: Secondary | ICD-10-CM | POA: Diagnosis not present

## 2019-12-16 DIAGNOSIS — R21 Rash and other nonspecific skin eruption: Secondary | ICD-10-CM | POA: Diagnosis not present

## 2019-12-16 DIAGNOSIS — M797 Fibromyalgia: Secondary | ICD-10-CM | POA: Diagnosis not present

## 2019-12-16 DIAGNOSIS — M791 Myalgia, unspecified site: Secondary | ICD-10-CM | POA: Diagnosis not present

## 2019-12-16 DIAGNOSIS — Z79899 Other long term (current) drug therapy: Secondary | ICD-10-CM | POA: Diagnosis not present

## 2019-12-16 DIAGNOSIS — M150 Primary generalized (osteo)arthritis: Secondary | ICD-10-CM | POA: Diagnosis not present

## 2019-12-18 ENCOUNTER — Other Ambulatory Visit: Payer: Self-pay | Admitting: Psychiatry

## 2019-12-18 DIAGNOSIS — F5105 Insomnia due to other mental disorder: Secondary | ICD-10-CM

## 2019-12-22 ENCOUNTER — Telehealth: Payer: Self-pay | Admitting: Internal Medicine

## 2019-12-22 MED ORDER — OXYCODONE-ACETAMINOPHEN 5-325 MG PO TABS: 1.0000 | ORAL_TABLET | Freq: Every day | ORAL | 0 refills | Status: DC | PRN

## 2019-12-22 NOTE — Telephone Encounter (Signed)
Refill request for Percocet/Roxicet, last seen 11-10-19, last filled 11-15-19.  Please advise.

## 2019-12-22 NOTE — Addendum Note (Signed)
Addended by: Crecencio Mc on: 12/22/2019 12:49 PM   Modules accepted: Orders

## 2019-12-22 NOTE — Telephone Encounter (Signed)
Pt needs a refill on oxyCODONE-acetaminophen (PERCOCET/ROXICET) 5-325 MG [facility] [place]  Pt is requesting a 71m supply on this medication   Pt is out and would like this refilled today

## 2019-12-24 NOTE — Telephone Encounter (Signed)
Pt picked up prescription for percocet and she said the pharmacist told her they only got 1 prescription. Pt states that she has been having at least 3 on file for her to be able to call and go get. She is wondering why this has changed and she has to call us each month to get her prescription?

## 2019-12-25 MED ORDER — OXYCODONE-ACETAMINOPHEN 5-325 MG PO TABS: 1.0000 | ORAL_TABLET | Freq: Every day | ORAL | 0 refills | Status: DC | PRN

## 2019-12-25 NOTE — Telephone Encounter (Signed)
Are you only sending in one month at a time now for narcotics?

## 2019-12-25 NOTE — Telephone Encounter (Signed)
No,  I still try to do three at at time. But sometimes I just cannot keep up with the demands of all of the patients who get narcotics and so I  Just do the one .  If the patient has to call , tell her I am sorry for the inconvenience but I am doing the best that I can .

## 2019-12-25 NOTE — Addendum Note (Signed)
Addended by: Crecencio Mc on: 12/25/2019 04:23 PM   Modules accepted: Orders

## 2019-12-26 NOTE — Telephone Encounter (Signed)
LMTCB

## 2019-12-29 DIAGNOSIS — R69 Illness, unspecified: Secondary | ICD-10-CM | POA: Diagnosis not present

## 2019-12-29 NOTE — Telephone Encounter (Signed)
Patient returned phone, please call her.

## 2019-12-29 NOTE — Telephone Encounter (Signed)
Spoke with pt and explained to her Dr. Lupita Dawn message below. Pt gave a verbal understanding.

## 2019-12-31 ENCOUNTER — Other Ambulatory Visit: Payer: Self-pay | Admitting: Psychiatry

## 2019-12-31 ENCOUNTER — Telehealth: Payer: Self-pay | Admitting: Psychiatry

## 2019-12-31 DIAGNOSIS — F5105 Insomnia due to other mental disorder: Secondary | ICD-10-CM

## 2019-12-31 MED ORDER — ZOLPIDEM TARTRATE 5 MG PO TABS: 5.0000 mg | ORAL_TABLET | Freq: Every day | ORAL | 0 refills | Status: DC

## 2019-12-31 NOTE — Telephone Encounter (Signed)
Pharmacy need approval to refill request from pt for [facility]. Last fill 10/15/19 for 90 day. Should still have some. Call [phone]

## 2019-12-31 NOTE — Telephone Encounter (Signed)
Pt returned call to [place].

## 2019-12-31 NOTE — Telephone Encounter (Signed)
Madeline Young returned the call she missed about the refill of the Ambien.  She doesn't know why she is out early.  She has taken as prescribed.  All she knows is she is out and needs a refill. She doesn't count her pills so she can't say definitely that perhaps she didn't get a full 90 pills. If ok, please send in approval [facility] [place]

## 2020-01-01 DIAGNOSIS — H6123 Impacted cerumen, bilateral: Secondary | ICD-10-CM | POA: Diagnosis not present

## 2020-01-01 DIAGNOSIS — H04209 Unspecified epiphora, unspecified lacrimal gland: Secondary | ICD-10-CM | POA: Diagnosis not present

## 2020-01-01 DIAGNOSIS — J300 Vasomotor rhinitis: Secondary | ICD-10-CM | POA: Diagnosis not present

## 2020-01-01 DIAGNOSIS — J301 Allergic rhinitis due to pollen: Secondary | ICD-10-CM | POA: Diagnosis not present

## 2020-01-01 DIAGNOSIS — H6063 Unspecified chronic otitis externa, bilateral: Secondary | ICD-10-CM | POA: Diagnosis not present

## 2020-01-01 NOTE — Telephone Encounter (Signed)
RX submitted yesterday okay for early refill

## 2020-01-02 NOTE — Telephone Encounter (Signed)
Prior authorization submitted for ZOLPIDEM 5 MG #90/90 DAY to [facility] Part D.   The prices on GoodRX are pretty cheap at [employer] $13.27 for 90 day [facility] $18.63 [facility] $15.98 [facility] $11.23   Left patient message about her prior authorization and it may be cheaper for her to just pay out of pocket since her insurance is not covering it.

## 2020-01-02 NOTE — Telephone Encounter (Signed)
[facility] [place] [facility]em 5 mg Rx, her [employer]/Medicare Part D plan only allows for #90 for a whole year unless a prior authorization submitted. Will submit one today.  ID#[id number] GRP [id number] [phone]

## 2020-01-02 NOTE — Telephone Encounter (Signed)
Working on [place] for patient with [employer] Part D

## 2020-01-03 ENCOUNTER — Other Ambulatory Visit: Payer: Self-pay | Admitting: Psychiatry

## 2020-01-04 NOTE — Telephone Encounter (Signed)
Prior approval received for ZOLPIDEM 5 MG TABLET effective 10/10/2019-10/08/2020 through [facility]. [facility]

## 2020-01-06 NOTE — Telephone Encounter (Signed)
Patient filled Rx on 01/04/2020, #90

## 2020-01-12 MED ORDER — OXYCODONE-ACETAMINOPHEN 5-325 MG PO TABS: 1.0000 | ORAL_TABLET | Freq: Every day | ORAL | 0 refills | Status: DC | PRN

## 2020-01-12 NOTE — Telephone Encounter (Signed)
I have sent in the refill with an earlier refill date but this is not  up to me.  The pharmacy may choose not to fill it early because it is a controlled substance

## 2020-01-12 NOTE — Addendum Note (Signed)
Addended by: Crecencio Mc on: 01/12/2020 05:43 PM   Modules accepted: Orders

## 2020-01-12 NOTE — Telephone Encounter (Signed)
Pt called to get the refill on oxyCODONE-acetaminophen (PERCOCET/ROXICET) 5-325 MG tablet sent in on 01/18/20 and not on 01/21/20 she will be out of town starting on 01/19/20 Per pt she stated that Dr. Derrel Nip would do this for her

## 2020-01-13 NOTE — Telephone Encounter (Signed)
Patient notified and voiced understanding.

## 2020-01-22 ENCOUNTER — Other Ambulatory Visit: Payer: Self-pay | Admitting: Internal Medicine

## 2020-01-27 ENCOUNTER — Other Ambulatory Visit: Payer: Self-pay

## 2020-01-27 ENCOUNTER — Telehealth: Payer: Self-pay | Admitting: Internal Medicine

## 2020-01-27 ENCOUNTER — Other Ambulatory Visit: Payer: Self-pay | Admitting: Internal Medicine

## 2020-01-27 ENCOUNTER — Other Ambulatory Visit (INDEPENDENT_AMBULATORY_CARE_PROVIDER_SITE_OTHER): Payer: Medicare HMO

## 2020-01-27 DIAGNOSIS — R300 Dysuria: Secondary | ICD-10-CM

## 2020-01-27 LAB — URINALYSIS, ROUTINE W REFLEX MICROSCOPIC
Bilirubin Urine: NEGATIVE
Hgb urine dipstick: NEGATIVE
Ketones, ur: NEGATIVE
Leukocytes,Ua: NEGATIVE
Nitrite: NEGATIVE
RBC / HPF: NONE SEEN (ref 0–?)
Specific Gravity, Urine: 1.02 (ref 1.000–1.030)
Total Protein, Urine: NEGATIVE
Urine Glucose: NEGATIVE
Urobilinogen, UA: 0.2 (ref 0.0–1.0)
pH: 6.5 (ref 5.0–8.0)

## 2020-01-27 NOTE — Telephone Encounter (Signed)
Pt has been scheduled for a virtual visit tomorrow at 1:15pm.

## 2020-01-27 NOTE — Telephone Encounter (Signed)
Spoke with pt she stated that 3 days ago she started having symptoms of urinary frequency, urgency, pressure and painful urination. Pt is scheduled to give a urine sample today. There is no appts with you for the rest of this week, would you like for me to work her in somewhere for a virtual/telephone visit?

## 2020-01-27 NOTE — Telephone Encounter (Signed)
1:15 virtual visit on Wednesday

## 2020-01-27 NOTE — Telephone Encounter (Signed)
Patient thinks she has a UTI and would like to do a urine sample. No appointments available at office today or tomorrow.

## 2020-01-28 ENCOUNTER — Encounter: Payer: Self-pay | Admitting: Internal Medicine

## 2020-01-28 ENCOUNTER — Telehealth (INDEPENDENT_AMBULATORY_CARE_PROVIDER_SITE_OTHER): Payer: Medicare HMO | Admitting: Internal Medicine

## 2020-01-28 DIAGNOSIS — R350 Frequency of micturition: Secondary | ICD-10-CM | POA: Diagnosis not present

## 2020-01-28 DIAGNOSIS — M5430 Sciatica, unspecified side: Secondary | ICD-10-CM | POA: Insufficient documentation

## 2020-01-28 LAB — URINE CULTURE
MICRO NUMBER:: 10384417
SPECIMEN QUALITY:: ADEQUATE

## 2020-01-28 MED ORDER — PREDNISONE 10 MG PO TABS: ORAL_TABLET | ORAL | 0 refills | Status: DC

## 2020-01-28 NOTE — Assessment & Plan Note (Signed)
No signs of UTI by current UA

## 2020-01-28 NOTE — Assessment & Plan Note (Signed)
Likely aggravated by trip and PA flare.  Prednisone taper prescribed ;  She is already taking narcotics and is not due for refill.  Keep follow up appt next week.

## 2020-01-28 NOTE — Progress Notes (Signed)
Telephone Note  This visit type was conducted due to national recommendations for restrictions regarding the COVID-19 pandemic (e.g. social distancing).  This format is felt to be most appropriate for this patient at this time.  All issues noted in this document were discussed and addressed.  No physical exam was performed (except for noted visual exam findings with Video Visits).   I connected with@ on 01/28/20 at  1:15 PM EDT by telephone and verified that I am speaking with the correct person using two identifiers. Location patient: home Location provider: work or home office Persons participating in the virtual visit: patient, provider  I discussed the limitations, risks, security and privacy concerns of performing an evaluation and management service by telephone and the availability of in person appointments. I also discussed with the patient that there may be a patient responsible charge related to this service. The patient expressed understanding and agreed to proceed.  Reason for visit: labial pain  HPI:  64 yr old female with psoriatic arthritis, lumbar spondylolisthesis presents with 2 day history of lower abdominal pain radiating to labia acc by urinary frequency.  [place] to beach,  5 hour drive,  Started having joint pain after she returned.  UA done yesterday is normal with no signs of inflammation or infection.  Feels she is having a PA flare. No fevers, nausea or hematuria no flank pain     ROS: See pertinent positives and negatives per HPI.  Past Medical History:  Diagnosis Date  . 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 ADHD (attention deficit hyperactivity disorder)   . Allergy   . Anxiety   . Arthritis   . Cataract    bil cateracts removed  . Complication of anesthesia    first colonoscopy pt states she woke up  . Depression   . GERD (gastroesophageal reflux disease)   . Globus sensation   . Hyperlipidemia   . Insomnia   . Multinodular goiter  (nontoxic)   . Psoriatic arthritis ([place])   . Spinal stenosis of lumbar region June 2016   MRI   . Statin intolerance 01/27/2013    Past Surgical History:  Procedure Laterality Date  . BACK SURGERY  02/17/2016   Dr. Jacqulynn Cadet- L4-L5 fusion   . BIOPSY THYROID  2014  . COLONOSCOPY W/ POLYPECTOMY    . EYE SURGERY     bilateral cataract surgery w/ lens implant  . FOOT SURGERY  2006   Right foot , secondary to severe loss of joint [facility])  . TONSILLECTOMY    . UPPER GASTROINTESTINAL ENDOSCOPY  2005   With empiric esophageal dilation    Family History  Problem Relation Age of Onset  . Heart disease Mother   . Hyperlipidemia Mother   . Coronary artery disease Father 41       CABG in early 7's  . Hyperlipidemia Father   . Epilepsy Grandchild        Severe form  . Colon cancer Neg Hx   . Esophageal cancer Neg Hx   . Rectal cancer Neg Hx   . Stomach cancer Neg Hx     SOCIAL HX:   reports that she quit smoking about 48 years ago. Her smoking use included cigarettes. She has never used smokeless tobacco. She reports current alcohol use of about 7.0 standard drinks of alcohol per week. She reports that she does not use drugs.   Current Outpatient Medications:  .  aspirin 81 MG tablet, Take 81 mg by  mouth daily.  , Disp: , Rfl:  .  calcium-vitamin D (OSCAL WITH D) 250-125 MG-UNIT tablet, Take 1 tablet by mouth daily., Disp: , Rfl:  .  celecoxib (CELEBREX) 200 MG capsule, Take 200 mg by mouth 2 (two) times daily., Disp: , Rfl:  .  cholecalciferol (VITAMIN D) 1000 UNITS tablet, Take 1,000 Units by mouth daily.  , Disp: , Rfl:  .  clobetasol (TEMOVATE) 0.05 % external solution, APPLY TO AFFECTED AREAS ON SCALP TWICE DAILY UNTIL CLEAR, Disp: , Rfl:  .  diclofenac Sodium (VOLTAREN) 1 % GEL, APPLY 2 GRAMS TO AFFECTED AREA 4 TIMES A DAY, Disp: 300 g, Rfl: 3 .  Etanercept (ENBREL) 25 MG/0.5ML SOLN, Inject into the skin., Disp: , Rfl:  .  fluocinonide ointment (LIDEX) 0.05 %, APPLY TWICE DAILY  TO AFFECTED AREAS UNTIL IMPROVED THEN AS NEEDED FOR FLARES, Disp: , Rfl:  .  FLUoxetine (PROZAC) 10 MG capsule, Take 1 capsule (10 mg total) by mouth daily., Disp: 90 capsule, Rfl: 0 .  FLUoxetine (PROZAC) 20 MG capsule, TAKE 1 CAPSULE BY MOUTH EVERY DAY, Disp: 90 capsule, Rfl: 1 .  Lactobacillus (PROBIOTIC ACIDOPHILUS PO), Take 1 capsule by mouth daily., Disp: , Rfl:  .  Lactobacillus-Inulin ([place] PO), Take 1 tablet by mouth daily. , Disp: , Rfl:  .  lisdexamfetamine (VYVANSE) 50 MG capsule, Take 1 capsule (50 mg total) by mouth daily., Disp: 30 capsule, Rfl: 0 .  lisdexamfetamine (VYVANSE) 50 MG capsule, Take 1 capsule (50 mg total) by mouth daily., Disp: 30 capsule, Rfl: 0 .  lisdexamfetamine (VYVANSE) 50 MG capsule, Take 1 capsule (50 mg total) by mouth daily., Disp: 30 capsule, Rfl: 0 .  Multiple Vitamin (MULTIVITAMIN) tablet, Take 1 tablet by mouth daily., Disp: , Rfl:  .  Omega-3 Fatty Acids (FISH OIL) 1200 MG CAPS, Take 1,200 mg by mouth daily. , Disp: , Rfl:  .  oxyCODONE-acetaminophen (PERCOCET/ROXICET) 5-325 MG tablet, Take 1-2 tablets by mouth daily as needed for moderate pain., Disp: 60 tablet, Rfl: 0 .  [START ON 02/20/2020] oxyCODONE-acetaminophen (PERCOCET/ROXICET) 5-325 MG tablet, Take 1-2 tablets by mouth daily as needed for moderate pain., Disp: 60 tablet, Rfl: 0 .  oxyCODONE-acetaminophen (PERCOCET/ROXICET) 5-325 MG tablet, Take 1-2 tablets by mouth daily as needed for moderate pain., Disp: 60 tablet, Rfl: 0 .  Polyethyl Glycol-Propyl Glycol (SYSTANE ULTRA) 0.4-0.3 % SOLN, Place 1 drop into both eyes daily as needed (for dry eyes)., Disp: , Rfl:  .  traZODone (DESYREL) 50 MG tablet, TAKE 1-2 TABLETS BY MOUTH AT BEDTIME., Disp: 180 tablet, Rfl: 0 .  triamcinolone cream (KENALOG) 0.1 %, APPLY TO AFFECTED AREA TWICE A DAY UNTIL CLEAR THEN AS NEEDED, Disp: , Rfl:  .  zolpidem (AMBIEN) 5 MG tablet, Please specify directions, refills and quantity, Disp: 90 tablet,  Rfl: 0 .  predniSONE (DELTASONE) 10 MG tablet, 6 tablets on Day 1 , then reduce by 1 tablet daily until gone, Disp: 21 tablet, Rfl: 0  EXAM:   General impression: alert, cooperative and articulate.  No signs of being in distress  Lungs: speech is fluent sentence length suggests that patient is not short of breath and not punctuated by cough, sneezing or sniffing. Marland Kitchen   Psych: affect normal.  speech is articulate and non pressured .  Denies suicidal thoughts    ASSESSMENT AND PLAN:  Discussed the following assessment and plan:  Sciatica, unspecified laterality  Urinary frequency  Sciatica Likely aggravated by trip and PA flare.  Prednisone taper prescribed ;  She is already taking narcotics and is not due for refill.  Keep follow up appt next week.   Urinary frequency No signs of UTI by current UA    I discussed the assessment and treatment plan with the patient. The patient was provided an opportunity to ask questions and all were answered. The patient agreed with the plan and demonstrated an understanding of the instructions.   The patient was advised to call back or seek an in-person evaluation if the symptoms worsen or if the condition fails to improve as anticipated.  I provided  15 minutes of non-face-to-face time during this encounter reviewing patient's current problems and past procedures/imaging studies, providing counseling on the above mentioned problems , and coordination  of care .  Crecencio Mc, MD

## 2020-01-29 ENCOUNTER — Encounter: Payer: Self-pay | Admitting: Psychiatry

## 2020-01-29 ENCOUNTER — Ambulatory Visit (INDEPENDENT_AMBULATORY_CARE_PROVIDER_SITE_OTHER): Payer: Medicare HMO | Admitting: Psychiatry

## 2020-01-29 DIAGNOSIS — F5105 Insomnia due to other mental disorder: Secondary | ICD-10-CM

## 2020-01-29 DIAGNOSIS — F3342 Major depressive disorder, recurrent, in full remission: Secondary | ICD-10-CM | POA: Diagnosis not present

## 2020-01-29 DIAGNOSIS — F900 Attention-deficit hyperactivity disorder, predominantly inattentive type: Secondary | ICD-10-CM | POA: Diagnosis not present

## 2020-01-29 DIAGNOSIS — R69 Illness, unspecified: Secondary | ICD-10-CM | POA: Diagnosis not present

## 2020-01-29 MED ORDER — FLUOXETINE HCL 20 MG PO CAPS: 20.0000 mg | ORAL_CAPSULE | Freq: Every day | ORAL | 1 refills | Status: DC

## 2020-01-29 MED ORDER — FLUOXETINE HCL 10 MG PO CAPS: 10.0000 mg | ORAL_CAPSULE | Freq: Every day | ORAL | 1 refills | Status: DC

## 2020-01-29 MED ORDER — LISDEXAMFETAMINE DIMESYLATE 60 MG PO CAPS: 60.0000 mg | ORAL_CAPSULE | Freq: Every day | ORAL | 0 refills | Status: DC

## 2020-01-29 MED ORDER — ZOLPIDEM TARTRATE 10 MG PO TABS: 5.0000 mg | ORAL_TABLET | Freq: Every day | ORAL | 5 refills | Status: DC

## 2020-01-29 NOTE — Progress Notes (Signed)
Madeline MCBREAIRTY [id number] [birth year] 64 y.o. Virtual Visit via [place]  I connected with pt by WebEx and verified that I am speaking with the correct person using two identifiers.   I discussed the limitations, risks, security and privacy concerns of performing an evaluation and management service by Madeline Young and the availability of in person appointments. I also discussed with the patient that there may be a patient responsible charge related to this service. The patient expressed understanding and agreed to proceed.  I discussed the assessment and treatment plan with the patient. The patient was provided an opportunity to ask questions and all were answered. The patient agreed with the plan and demonstrated an understanding of the instructions.   The patient was advised to call back or seek an in-person evaluation if the symptoms worsen or if the condition fails to improve as anticipated.  I provided 30 minutes of video time during this encounter. The call started at 100 and ended at 1:30. The patient was located at home and the provider was located office.   Subjective:   Patient ID:  Madeline Young is a 64 y.o. (DOB 1956/07/10) female.  Chief Complaint:  Chief Complaint  Patient presents with  . Follow-up  . Depression  . Anxiety    Depression        Associated symptoms include no decreased concentration and no suicidal ideas.  Madeline Young presents to the office today for follow-up of ADD and anxiety and depression and sleep.  seen August 2020.  No meds were changed.  On Vyvanse 50, fluoxetine 20, and Ambien 5.  Last seen November 30, 2019.  The following was noted: "Down".  Doesn't feel Vyvanse helping as much.  More scattered and doesn't finish things.  No SE with it. Sleep pattern is worse as Covid progressed.  Had reduced Zolpidem to 5 mg but then increased to 7.5 mg HS.  If takes Benadryl but can sleep but then has crying spells after it and hangover.  In the AM  feels more negative and tearful.  AM is hard bc stiff in the morning. Plan:Continue Vyvanse 50 mg AM Increase fluoxetine to 30 mg daily.  She doesn't want to go to 40 bc fear of serotonin syndrome.  Thinks she had some of those sx in the past Continue zolpidem 5 mg daily.  01/29/20 appt, reported:  Better with increased fluoxetine re: mood. No SE with meds. Still ADD problems not as well controlled as she'd like. Still problems with sleep and doesn't think ambien workiing as well as it should.  Tried trazodone with Ambien but had hangover and difficulty waking up and not a deep good sleep.  Never tried trazodone alone.  Increased Ambien on her own to 10 mg and ran out early.  Reduced to 5 mg and added Benadryl.  Erratic sleep since Covid.  Go to bed same time 10.  Avoids alcohol 2 hours before. Primary problem is going to sleep.  History of 10 mg Ambien for years.  Says 7.5 mg Ambien will work. Caffeine none after noon.  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  Helping to care for 79-month-old Madeline Young in [place].  Past Psychiatric Medication Trials:  Zolpidem, trazodone hangover Fluoxetine 60, zertraline NR, duloxetine, Lexapro, Wellbutrin, buspirone,  Ritalin LA, Concerta 54, Adderall 20 dizzy, Vyvanse 60 Under care at this office since July 2002   Review of Systems:  Review of Systems  Cardiovascular: Negative for palpitations.  Musculoskeletal: Positive for arthralgias and back pain.  Skin: Positive for rash.  Neurological: Negative for tremors and weakness.  Psychiatric/Behavioral: Positive for depression and sleep disturbance. Negative for agitation, behavioral problems, confusion, decreased concentration, dysphoric mood, hallucinations, self-injury and suicidal ideas. The patient is not nervous/anxious and is not hyperactive.     Medications: I have reviewed the patient's current medications.  Current Outpatient Medications  Medication Sig Dispense  Refill  . aspirin 81 MG tablet Take 81 mg by mouth daily.      . calcium-vitamin D (OSCAL WITH D) 250-125 MG-UNIT tablet Take 1 tablet by mouth daily.    . celecoxib (CELEBREX) 200 MG capsule Take 200 mg by mouth 2 (two) times daily.    . cholecalciferol (VITAMIN D) 1000 UNITS tablet Take 1,000 Units by mouth daily.      . clobetasol (TEMOVATE) 0.05 % external solution APPLY TO AFFECTED AREAS ON SCALP TWICE DAILY UNTIL CLEAR    . diclofenac Sodium (VOLTAREN) 1 % GEL APPLY 2 GRAMS TO AFFECTED AREA 4 TIMES A DAY 300 g 3  . Etanercept (ENBREL) 25 MG/0.5ML SOLN Inject into the skin.    . fluocinonide ointment (LIDEX) 0.05 % APPLY TWICE DAILY TO AFFECTED AREAS UNTIL IMPROVED THEN AS NEEDED FOR FLARES    . FLUoxetine (PROZAC) 10 MG capsule Take 1 capsule (10 mg total) by mouth daily. 90 capsule 0  . FLUoxetine (PROZAC) 20 MG capsule TAKE 1 CAPSULE BY MOUTH EVERY DAY 90 capsule 1  . Lactobacillus (PROBIOTIC ACIDOPHILUS PO) Take 1 capsule by mouth daily.    . Lactobacillus-Inulin (CULTURELLE DIGESTIVE HEALTH PO) Take 1 tablet by mouth daily.     Marland Kitchen lisdexamfetamine (VYVANSE) 50 MG capsule Take 1 capsule (50 mg total) by mouth daily. 30 capsule 0  . lisdexamfetamine (VYVANSE) 50 MG capsule Take 1 capsule (50 mg total) by mouth daily. 30 capsule 0  . lisdexamfetamine (VYVANSE) 50 MG capsule Take 1 capsule (50 mg total) by mouth daily. 30 capsule 0  . Multiple Vitamin (MULTIVITAMIN) tablet Take 1 tablet by mouth daily.    . Omega-3 Fatty Acids (FISH OIL) 1200 MG CAPS Take 1,200 mg by mouth daily.     Marland Kitchen oxyCODONE-acetaminophen (PERCOCET/ROXICET) 5-325 MG tablet Take 1-2 tablets by mouth daily as needed for moderate pain. 60 tablet 0  . [START ON 02/20/2020] oxyCODONE-acetaminophen (PERCOCET/ROXICET) 5-325 MG tablet Take 1-2 tablets by mouth daily as needed for moderate pain. 60 tablet 0  . oxyCODONE-acetaminophen (PERCOCET/ROXICET) 5-325 MG tablet Take 1-2 tablets by mouth daily as needed for moderate pain. 60  tablet 0  . Polyethyl Glycol-Propyl Glycol (SYSTANE ULTRA) 0.4-0.3 % SOLN Place 1 drop into both eyes daily as needed (for dry eyes).    . predniSONE (DELTASONE) 10 MG tablet 6 tablets on Day 1 , then reduce by 1 tablet daily until gone 21 tablet 0  . traZODone (DESYREL) 50 MG tablet TAKE 1-2 TABLETS BY MOUTH AT BEDTIME. 180 tablet 0  . triamcinolone cream (KENALOG) 0.1 % APPLY TO AFFECTED AREA TWICE A DAY UNTIL CLEAR THEN AS NEEDED    . zolpidem (AMBIEN) 5 MG tablet Please specify directions, refills and quantity 90 tablet 0   No current facility-administered medications for this visit.    Medication Side Effects: None  Allergies:  Allergies  Allergen Reactions  . Cosentyx [Secukinumab] Rash    Severe case of psorasis  . Sertraline Hcl Other (See Comments)    Made me crazy   . Avelox [Moxifloxacin Hcl In Nacl]  Pt cant remember reaction  . Latex Itching and Rash    Redness  . Macrobid [employer] Macro] Other (See Comments)    tired  . Statins Other (See Comments)    Muscle aches    Past Medical History:  Diagnosis Date  . 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 ADHD (attention deficit hyperactivity disorder)   . Allergy   . Anxiety   . Arthritis   . Cataract    bil cateracts removed  . Complication of anesthesia    first colonoscopy pt states she woke up  . Depression   . GERD (gastroesophageal reflux disease)   . Globus sensation   . Hyperlipidemia   . Insomnia   . Multinodular goiter (nontoxic)   . Psoriatic arthritis ([place])   . Spinal stenosis of lumbar region June 2016   MRI   . Statin intolerance 01/27/2013    Family History  Problem Relation Age of Onset  . Heart disease Mother   . Hyperlipidemia Mother   . Coronary artery disease Father 49       CABG in early 27's  . Hyperlipidemia Father   . Epilepsy Grandchild        Severe form  . Colon cancer Neg Hx   . Esophageal cancer Neg Hx   . Rectal cancer Neg Hx   . Stomach cancer Neg Hx      Social History   Socioeconomic History  . Marital status: Married    Spouse name: Not on file  . Number of children: 2  . Years of education: Not on file  . Highest education level: Not on file  Occupational History  . Occupation: disability  Tobacco Use  . Smoking status: Former Smoker    Types: Cigarettes    Quit date: 08/21/1971    Years since quitting: 48.4  . Smokeless tobacco: Never Used  Substance and Sexual Activity  . Alcohol use: Yes    Alcohol/week: 7.0 standard drinks    Types: 7 Glasses of wine per week  . Drug use: No  . Sexual activity: Not on file  Other Topics Concern  . Not on file  Social History Narrative   Married, retired Therapist, sports   2 daughters some grandchildren   2 caffeinated drinks daily   1 alcoholic beverage daily   No tobacco   04/29/2015   Social Determinants of Health   Financial Resource Strain:   . Difficulty of Paying Living Expenses:   Food Insecurity:   . Worried About Charity fundraiser in the Last Year:   . Arboriculturist in the Last Year:   Transportation Needs:   . Film/video editor (Medical):   Marland Kitchen Lack of Transportation (Non-Medical):   Physical Activity:   . Days of Exercise per Week:   . Minutes of Exercise per Session:   Stress:   . Feeling of Stress :   Social Connections:   . Frequency of Communication with Friends and Family:   . Frequency of Social Gatherings with Friends and Family:   . Attends Religious Services:   . Active Member of Clubs or Organizations:   . Attends Archivist Meetings:   Marland Kitchen Marital Status:   Intimate Partner Violence:   . Fear of Current or Ex-Partner:   . Emotionally Abused:   Marland Kitchen Physically Abused:   . Sexually Abused:     Past Medical History, Surgical history, Social history, and Family history were reviewed and updated as  appropriate.   Please see review of systems for further details on the patient's review from today.   Objective:   Physical Exam:  There were  no vitals taken for this visit.  Physical Exam Constitutional:      General: She is not in acute distress.    Appearance: Normal appearance. She is well-developed.  Musculoskeletal:        General: No deformity.  Neurological:     Mental Status: She is alert and oriented to person, place, and time.     Motor: No tremor.     Coordination: Coordination normal.     Gait: Gait normal.  Psychiatric:        Attention and Perception: Perception normal. She is inattentive.        Mood and Affect: Mood is not anxious or depressed. Affect is not labile, blunt, angry or inappropriate.        Speech: Speech normal.        Behavior: Behavior normal.        Thought Content: Thought content normal. Thought content does not include homicidal or suicidal ideation. Thought content does not include homicidal or suicidal plan.        Cognition and Memory: Cognition normal.        Judgment: Judgment normal.     Comments: Insight intact. No auditory or visual hallucinations. No delusions.  Talkative per usual. Reduced depression     Lab Review:     Component Value Date/Time   NA 137 08/07/2019 1159   K 4.0 08/07/2019 1159   CL 101 08/07/2019 1159   CO2 32 08/07/2019 1159   GLUCOSE 102 (H) 08/07/2019 1159   BUN 20 08/07/2019 1159   CREATININE 0.65 08/07/2019 1159   CALCIUM 9.9 08/07/2019 1159   PROT 6.9 08/07/2019 1159   ALBUMIN 4.3 08/07/2019 1159   AST 18 08/07/2019 1159   ALT 13 08/07/2019 1159   ALKPHOS 49 08/07/2019 1159   BILITOT 0.5 08/07/2019 1159   GFRNONAA >60 02/18/2016 0322   GFRAA >60 02/18/2016 0322       Component Value Date/Time   WBC 4.6 08/07/2019 1159   RBC 3.96 08/07/2019 1159   HGB 13.0 08/07/2019 1159   HCT 38.3 08/07/2019 1159   PLT 238.0 08/07/2019 1159   MCV 96.6 08/07/2019 1159   MCH 31.0 02/18/2016 0322   MCHC 33.9 08/07/2019 1159   RDW 13.0 08/07/2019 1159   LYMPHSABS 2.1 08/07/2019 1159   MONOABS 0.4 08/07/2019 1159   EOSABS 0.1 08/07/2019 1159    BASOSABS 0.0 08/07/2019 1159    No results found for: POCLITH, LITHIUM   No results found for: PHENYTOIN, PHENOBARB, VALPROATE, CBMZ   .res Assessment: Plan:    Dymphna was seen today for follow-up, depression and anxiety.  Diagnoses and all orders for this visit:  Major depression, recurrent, full remission ([place])  Insomnia due to mental condition  Attention deficit hyperactivity disorder (ADHD), predominantly inattentive type   Less response to psych meds fo ADD and sleep generally.  But depression is better with increase fluoxetine.  Still has significant ADD and consider increase it   Able to get by with 5 mg Ambien usually, but occ needs to increase to .7.5 mg which insurance won't cover so switch to 10 mg tablet 1/2-1 tablet.  Disc risk of Amnesia. Don't exceed rec dose  Disc pros/cons of switching stimulants or adjusting dose.  Disc SE and withdrawal and dosing options.  Would be best to control it.  Option increase if needed, she wishes to increase it to 60 mg Vyvanse.   Discussed potential benefits, risks, and side effects of stimulants with patient to include increased heart rate, palpitations, insomnia, increased anxiety, increased irritability, or decreased appetite.  Instructed patient to contact office if experiencing any significant tolerability issues.  Disc risk steroids affecting mood and sleep.  Supportive therapy dealing with stubborn H with memory problems.  Increase Vyvanse 60 mg AM continue fluoxetine to 30 mg daily.  She doesn't want to go to 40 bc fear of serotonin syndrome.  Thinks she had some of those sx in the past.  It helped to increase Increase zolpidem 5-10 mg daily.  This appt was 30 mins.   FU 6 mos.  Lynder Parents, MD, DFAPA   Please see After Visit Summary for patient specific instructions.  Future Appointments  Date Time Provider [place]  02/06/2020 10:30 AM Crecencio Mc, MD [id number]  07/16/2020 10:00 AM  O'Brien-Blaney, Denisa L, LPN [id number]    No orders of the defined types were placed in this encounter.     -------------------------------

## 2020-02-03 NOTE — Telephone Encounter (Signed)
error 

## 2020-02-06 ENCOUNTER — Ambulatory Visit (INDEPENDENT_AMBULATORY_CARE_PROVIDER_SITE_OTHER): Payer: Medicare HMO | Admitting: Internal Medicine

## 2020-02-06 ENCOUNTER — Encounter: Payer: Self-pay | Admitting: Internal Medicine

## 2020-02-06 ENCOUNTER — Other Ambulatory Visit: Payer: Self-pay

## 2020-02-06 VITALS — BP 128/62 | HR 78 | Temp 96.5°F | Resp 15 | Ht 64.5 in | Wt 171.8 lb

## 2020-02-06 DIAGNOSIS — E7849 Other hyperlipidemia: Secondary | ICD-10-CM | POA: Diagnosis not present

## 2020-02-06 DIAGNOSIS — M5432 Sciatica, left side: Secondary | ICD-10-CM | POA: Diagnosis not present

## 2020-02-06 DIAGNOSIS — F5101 Primary insomnia: Secondary | ICD-10-CM

## 2020-02-06 DIAGNOSIS — L4052 Psoriatic arthritis mutilans: Secondary | ICD-10-CM

## 2020-02-06 DIAGNOSIS — Z79899 Other long term (current) drug therapy: Secondary | ICD-10-CM

## 2020-02-06 DIAGNOSIS — M5431 Sciatica, right side: Secondary | ICD-10-CM

## 2020-02-06 DIAGNOSIS — R69 Illness, unspecified: Secondary | ICD-10-CM | POA: Diagnosis not present

## 2020-02-06 DIAGNOSIS — E042 Nontoxic multinodular goiter: Secondary | ICD-10-CM | POA: Diagnosis not present

## 2020-02-06 LAB — COMPREHENSIVE METABOLIC PANEL
ALT: 14 U/L (ref 0–35)
AST: 16 U/L (ref 0–37)
Albumin: 4.4 g/dL (ref 3.5–5.2)
Alkaline Phosphatase: 58 U/L (ref 39–117)
BUN: 21 mg/dL (ref 6–23)
CO2: 32 mEq/L (ref 19–32)
Calcium: 9.5 mg/dL (ref 8.4–10.5)
Chloride: 100 mEq/L (ref 96–112)
Creatinine, Ser: 0.72 mg/dL (ref 0.40–1.20)
GFR: 81.17 mL/min (ref >60.00)
Glucose, Bld: 99 mg/dL (ref 70–99)
Potassium: 3.8 mEq/L (ref 3.5–5.1)
Sodium: 138 mEq/L (ref 135–145)
Total Bilirubin: 0.4 mg/dL (ref 0.2–1.2)
Total Protein: 7 g/dL (ref 6.0–8.3)

## 2020-02-06 MED ORDER — OXYCODONE-ACETAMINOPHEN 5-325 MG PO TABS: 1.0000 | ORAL_TABLET | Freq: Every day | ORAL | 0 refills | Status: DC | PRN

## 2020-02-06 MED ORDER — CELECOXIB 200 MG PO CAPS: 200.0000 mg | ORAL_CAPSULE | Freq: Every day | ORAL | 1 refills | Status: DC

## 2020-02-06 NOTE — Assessment & Plan Note (Signed)
Untreated due to statin and Repatha intolerance.  Calcium score was zero on recent cardiac CT

## 2020-02-06 NOTE — Assessment & Plan Note (Addendum)
Recent flare caused by long car trip to [place].  Now resolved post prednisone taper.  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  Will follow up with Lacinda Axon and with MRI if symptoms become severe.

## 2020-02-06 NOTE — Progress Notes (Signed)
Subjective:  Patient ID: Madeline Young, female    DOB: 10-24-1955  Age: 64 y.o. MRN: [id number]  CC: The primary encounter diagnosis was Long-term use of high-risk medication. 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  HPI Madeline Young presents for  Follow up on sciatica treated with prednisone taper one week ago  1) sciatica: symptoms of bilateral upper thigh pain resolved with 5 days of prednisone.  She has a history of DDD ,;  MRI ordered by Lacinda Axon last year was not done due to [place].   Prior surgery included an L4-S1 fusion by neurosurgery [facility]  in 2017,  Per patient it did not successfully fuse  .  Recent  History of difficult ESI procedure caused nausea,syncope  2) chronic pain involving lower back ,  Multiple joints of hans,   managed with once daily celebres, topical voltaren and 1-2  oxy/apap  Daily,  Refill history confirmed via [place] Controlled Substance databas, accessed by me today. Last refill April 11 .3 month follow up today   3) ADD .  Also taking vyvanse and ambien prescribed by Dr Clovis Pu.     Outpatient Medications Prior to Visit  Medication Sig Dispense Refill  . aspirin 81 MG tablet Take 81 mg by mouth daily.      . calcium-vitamin D (OSCAL WITH D) 250-125 MG-UNIT tablet Take 1 tablet by mouth daily.    . cholecalciferol (VITAMIN D) 1000 UNITS tablet Take 1,000 Units by mouth daily.      . clobetasol (TEMOVATE) 0.05 % external solution APPLY TO AFFECTED AREAS ON SCALP TWICE DAILY UNTIL CLEAR    . diclofenac Sodium (VOLTAREN) 1 % GEL APPLY 2 GRAMS TO AFFECTED AREA 4 TIMES A DAY 300 g 3  . Etanercept (ENBREL) 25 MG/0.5ML SOLN Inject into the skin.    . fluocinonide ointment (LIDEX) 0.05 % APPLY TWICE DAILY TO AFFECTED AREAS UNTIL IMPROVED THEN AS NEEDED FOR FLARES    . FLUoxetine (PROZAC) 10 MG capsule Take 1  capsule (10 mg total) by mouth daily. 90 capsule 1  . FLUoxetine (PROZAC) 20 MG capsule Take 1 capsule (20 mg total) by mouth daily. 90 capsule 1  . Lactobacillus (PROBIOTIC ACIDOPHILUS PO) Take 1 capsule by mouth daily.    . Lactobacillus-Inulin (CULTURELLE DIGESTIVE HEALTH PO) Take 1 tablet by mouth daily.     Marland Kitchen lisdexamfetamine (VYVANSE) 60 MG capsule Take 1 capsule (60 mg total) by mouth daily. 30 capsule 0  . [START ON 02/26/2020] lisdexamfetamine (VYVANSE) 60 MG capsule Take 1 capsule (60 mg total) by mouth daily. 30 capsule 0  . [START ON 03/25/2020] lisdexamfetamine (VYVANSE) 60 MG capsule Take 1 capsule (60 mg total) by mouth daily. 30 capsule 0  . Multiple Vitamin (MULTIVITAMIN) tablet Take 1 tablet by mouth daily.    . Omega-3 Fatty Acids (FISH OIL) 1200 MG CAPS Take 1,200 mg by mouth daily.     Derrill Memo ON 02/20/2020] oxyCODONE-acetaminophen (PERCOCET/ROXICET) 5-325 MG tablet Take 1-2 tablets by mouth daily as needed for moderate pain. 60 tablet 0  . Polyethyl Glycol-Propyl Glycol (SYSTANE ULTRA) 0.4-0.3 % SOLN Place 1 drop into both eyes daily as needed (for dry eyes).    . traZODone (DESYREL) 50 MG tablet TAKE 1-2 TABLETS BY MOUTH AT BEDTIME. 180 tablet 0  . triamcinolone cream (KENALOG) 0.1 % APPLY TO AFFECTED AREA TWICE A DAY UNTIL CLEAR THEN AS NEEDED    .  zolpidem (AMBIEN) 10 MG tablet Take 0.5-1 tablets (5-10 mg total) by mouth at bedtime. 30 tablet 5  . celecoxib (CELEBREX) 200 MG capsule Take 200 mg by mouth 2 (two) times daily.    Marland Kitchen oxyCODONE-acetaminophen (PERCOCET/ROXICET) 5-325 MG tablet Take 1-2 tablets by mouth daily as needed for moderate pain. 60 tablet 0  . oxyCODONE-acetaminophen (PERCOCET/ROXICET) 5-325 MG tablet Take 1-2 tablets by mouth daily as needed for moderate pain. 60 tablet 0  . predniSONE (DELTASONE) 10 MG tablet 6 tablets on Day 1 , then reduce by 1 tablet daily until gone (Patient not taking: Reported on 02/06/2020) 21 tablet 0   No facility-administered  medications prior to visit.    Review of Systems;  Patient denies headache, fevers, malaise, unintentional weight loss, skin rash, eye pain, sinus congestion and sinus pain, sore throat, dysphagia,  hemoptysis , cough, dyspnea, wheezing, chest pain, palpitations, orthopnea, edema, abdominal pain, nausea, melena, diarrhea, constipation, flank pain, dysuria, hematuria, urinary  Frequency, nocturia, numbness, tingling, seizures,  Focal weakness, Loss of consciousness,  Tremor, insomnia, depression, anxiety, and suicidal ideation.      Objective:  BP 128/62 (BP Location: Left Arm, Patient Position: Sitting, Cuff Size: Normal)   Pulse 78   Temp (!) 96.5 F (35.8 C) (Temporal)   Resp 15   Ht 5' 4.5" (1.638 m)   Wt 171 lb 12.8 oz (77.9 kg)   SpO2 98%   BMI 29.03 kg/m   BP Readings from Last 3 Encounters:  02/06/20 128/62  08/07/19 128/66  10/14/18 (!) 112/58    Wt Readings from Last 3 Encounters:  02/06/20 171 lb 12.8 oz (77.9 kg)  01/28/20 165 lb (74.8 kg)  11/10/19 176 lb (79.8 kg)    General appearance: alert, cooperative and appears stated age Ears: normal TM's and external ear canals both ears Throat: lips, mucosa, and tongue normal; teeth and gums normal Neck: no adenopathy, no carotid bruit, supple, symmetrical, trachea midline and thyroid not enlarged, symmetric, no tenderness/mass/nodules Back: symmetric, no curvature. ROM normal. No CVA tenderness. Lungs: clear to auscultation bilaterally Heart: regular rate and rhythm, S1, S2 normal, no murmur, click, rub or gallop Abdomen: soft, non-tender; bowel sounds normal; no masses,  no organomegaly Pulses: 2+ and symmetric Skin: Skin color, texture, turgor normal. No rashes or lesions Lymph nodes: Cervical, supraclavicular, and axillary nodes normal.  No results found for: HGBA1C  Lab Results  Component Value Date   CREATININE 0.72 02/06/2020   CREATININE 0.65 08/07/2019   CREATININE 0.65 01/13/2019    Lab Results    Component Value Date   WBC 4.6 08/07/2019   HGB 13.0 08/07/2019   HCT 38.3 08/07/2019   PLT 238.0 08/07/2019   GLUCOSE 99 02/06/2020   CHOL 397 (H) 01/13/2019   TRIG 97.0 01/13/2019   HDL 56.10 01/13/2019   LDLDIRECT 252.3 06/12/2012   LDLCALC 322 (H) 01/13/2019   ALT 14 02/06/2020   AST 16 02/06/2020   NA 138 02/06/2020   K 3.8 02/06/2020   CL 100 02/06/2020   CREATININE 0.72 02/06/2020   BUN 21 02/06/2020   CO2 32 02/06/2020   TSH 1.02 01/13/2019    MM 3D SCREEN BREAST BILATERAL  Result Date: 03/14/2019 CLINICAL DATA:  Screening. EXAM: DIGITAL SCREENING BILATERAL MAMMOGRAM WITH TOMO AND CAD COMPARISON:  Previous exam(s). ACR Breast Density Category b: There are scattered areas of fibroglandular density. FINDINGS: There are no findings suspicious for malignancy. Images were processed with CAD. IMPRESSION: No mammographic evidence of malignancy. A result  letter of this screening mammogram will be mailed directly to the patient. RECOMMENDATION: Screening mammogram in one year. (Code:SM-B-01Y) BI-RADS CATEGORY  1: Negative. Electronically Signed   By: Margarette Canada M.D.   On: 03/14/2019 16:47    Assessment & Plan:   Problem List Items Addressed This Visit      Unprioritized   Familial hyperlipidemia    Untreated due to statin and Repatha intolerance.  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 She has had multiple other drug trials and has now been taking Enbrel for the past 12  months. .Continue celebrex qd-bid,  voltaren gel not more than  twice daily ad continue  To allow use of percocet to #60/month for pain management.  She has not had any ER visits  And has not requested any early refills.  Her Refill history was confirmed via [place] Controlled Substance database by me today during her visit and there have been no prescriptions of controlled substances filled from any providers other than me. .       Relevant Medications   celecoxib (CELEBREX) 200 MG capsule   oxyCODONE-acetaminophen (PERCOCET/ROXICET) 5-325 MG tablet (Start on 03/21/2020)   oxyCODONE-acetaminophen (PERCOCET/ROXICET) 5-325 MG tablet (Start on 04/20/2020)   Sciatica    Recent flare caused by long car trip to [place].  Now resolved post prednisone taper.  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  Will follow up with Lacinda Axon and with MRI if symptoms become severe.        Other Visit Diagnoses    Long-term use of high-risk medication    -  Primary   Relevant Orders   Comprehensive metabolic panel (Completed)      I provided  30 minutes of  face-to-face time during this encounter reviewing patient's current problems and past surgeries, labs and imaging studies, providing counseling on the above mentioned problems , and coordination  of care . I have discontinued Sylena D. Schwinn "[name]"'s predniSONE. I have also changed her celecoxib. Additionally, I am having her maintain her aspirin, cholecalciferol, Fish Oil, Lactobacillus-Inulin (CULTURELLE DIGESTIVE HEALTH PO), Polyethyl Glycol-Propyl Glycol, calcium-vitamin D, multivitamin, fluocinonide ointment, triamcinolone cream, clobetasol, Enbrel, traZODone, oxyCODONE-acetaminophen, diclofenac Sodium, Lactobacillus (PROBIOTIC ACIDOPHILUS PO), lisdexamfetamine, lisdexamfetamine, lisdexamfetamine, zolpidem, FLUoxetine, FLUoxetine, oxyCODONE-acetaminophen, and oxyCODONE-acetaminophen.  Meds ordered this encounter  Medications  . celecoxib (CELEBREX) 200 MG capsule    Sig:  Take 1 capsule (200 mg total) by mouth daily.    Dispense:  90 capsule    Refill:  1  . oxyCODONE-acetaminophen (PERCOCET/ROXICET) 5-325 MG tablet    Sig: Take 1-2 tablets by mouth daily as needed for moderate pain.    Dispense:  60 tablet    Refill:  0  . oxyCODONE-acetaminophen (PERCOCET/ROXICET) 5-325 MG tablet    Sig: Take 1-2 tablets by mouth daily as needed for moderate pain.    Dispense:  60 tablet    Refill:  0  Medications Discontinued During This Encounter  Medication Reason  . predniSONE (DELTASONE) 10 MG tablet Completed Course  . celecoxib (CELEBREX) 200 MG capsule Reorder  . oxyCODONE-acetaminophen (PERCOCET/ROXICET) 5-325 MG tablet Reorder  . oxyCODONE-acetaminophen (PERCOCET/ROXICET) 5-325 MG tablet Reorder    Follow-up: No follow-ups on file.   Crecencio Mc, MD

## 2020-02-08 NOTE — Assessment & Plan Note (Signed)
Managed with Lorrin Mais , prescribed by her psychiatrist/

## 2020-02-08 NOTE — Assessment & Plan Note (Signed)
Last thyroid function assessment was one year ago   Lab Results  Component Value Date   TSH 1.02 01/13/2019  '

## 2020-02-08 NOTE — Assessment & Plan Note (Signed)
Had to stop  Cosentyx due to intolerance and recent development of psoriasis diagnosed by dermatology ([place]) which was aggravated by use of a steroid taper. She has had multiple other drug trials and has now been taking Enbrel for the past 12  months. .Continue celebrex qd-bid,  voltaren gel not more than twice daily ad continue  To allow use of percocet to #60/month for pain management.  She has not had any ER visits  And has not requested any early refills.  Her Refill history was confirmed via [place] Controlled Substance database by me today during her visit and there have been no prescriptions of controlled substances filled from any providers other than me. Marland Kitchen

## 2020-02-12 ENCOUNTER — Encounter: Payer: Self-pay | Admitting: Psychiatry

## 2020-02-18 ENCOUNTER — Other Ambulatory Visit: Payer: Self-pay | Admitting: Internal Medicine

## 2020-02-18 DIAGNOSIS — Z1231 Encounter for screening mammogram for malignant neoplasm of breast: Secondary | ICD-10-CM

## 2020-04-22 ENCOUNTER — Telehealth: Payer: Self-pay | Admitting: Psychiatry

## 2020-04-22 NOTE — Telephone Encounter (Signed)
Patient would like to wait for dr. Clovis Pu to come back from vacation to send a 3 month supply to [facility] [address] in [place]

## 2020-04-23 NOTE — Telephone Encounter (Signed)
VYVANSE  60 mg

## 2020-04-26 ENCOUNTER — Other Ambulatory Visit: Payer: Self-pay

## 2020-04-26 DIAGNOSIS — F900 Attention-deficit hyperactivity disorder, predominantly inattentive type: Secondary | ICD-10-CM

## 2020-04-26 MED ORDER — LISDEXAMFETAMINE DIMESYLATE 60 MG PO CAPS: 60.0000 mg | ORAL_CAPSULE | Freq: Every day | ORAL | 0 refills | Status: DC

## 2020-04-26 NOTE — Telephone Encounter (Signed)
3 Rx's pended for Dr. Clovis Pu to submit for Vyvanse 60 mg

## 2020-05-10 ENCOUNTER — Other Ambulatory Visit: Payer: Self-pay

## 2020-05-10 ENCOUNTER — Ambulatory Visit (INDEPENDENT_AMBULATORY_CARE_PROVIDER_SITE_OTHER): Payer: Medicare HMO | Admitting: Internal Medicine

## 2020-05-10 ENCOUNTER — Encounter: Payer: Self-pay | Admitting: Internal Medicine

## 2020-05-10 DIAGNOSIS — L4052 Psoriatic arthritis mutilans: Secondary | ICD-10-CM | POA: Diagnosis not present

## 2020-05-10 DIAGNOSIS — J22 Unspecified acute lower respiratory infection: Secondary | ICD-10-CM | POA: Diagnosis not present

## 2020-05-10 DIAGNOSIS — Z20822 Contact with and (suspected) exposure to covid-19: Secondary | ICD-10-CM | POA: Diagnosis not present

## 2020-05-10 DIAGNOSIS — R69 Illness, unspecified: Secondary | ICD-10-CM | POA: Diagnosis not present

## 2020-05-10 DIAGNOSIS — F325 Major depressive disorder, single episode, in full remission: Secondary | ICD-10-CM | POA: Diagnosis not present

## 2020-05-10 MED ORDER — AMOXICILLIN-POT CLAVULANATE 875-125 MG PO TABS
1.0000 | ORAL_TABLET | Freq: Two times a day (BID) | ORAL | 0 refills | Status: DC
Start: 2020-05-10 — End: 2020-07-16

## 2020-05-10 MED ORDER — HYDROCOD POLST-CPM POLST ER 10-8 MG/5ML PO SUER: 5.0000 mL | Freq: Every evening | ORAL | 0 refills | Status: DC | PRN

## 2020-05-10 MED ORDER — OXYCODONE-ACETAMINOPHEN 5-325 MG PO TABS: 1.0000 | ORAL_TABLET | Freq: Every day | ORAL | 0 refills | Status: DC | PRN

## 2020-05-10 NOTE — Assessment & Plan Note (Signed)
COVID 19 Testing recommended .  Quarantine until status Is known/ Cough suppressant prescribed.  augmentin for use if sinusitis symptoms become positive

## 2020-05-10 NOTE — Progress Notes (Signed)
Office Visit converted to Telephone Visit  This visit type was conducted due to national recommendations for restrictions regarding the COVID-19 pandemic (e.g. social distancing).  This format is felt to be most appropriate for this patient at this time.  All issues noted in this document were discussed and addressed.  No physical exam was performed (except for noted visual exam findings with Video Visits).   I connected with@ on 05/10/20 at  9:00 AM EDT by  telephone and verified that I am speaking with the correct person using two identifiers. Location patient: home Location provider: work or home office Persons participating in the virtual visit: patient, provider  I discussed the limitations, risks, security and privacy concerns of performing an evaluation and management service by telephone and the availability of in person appointments. I also discussed with the patient that there may be a patient responsible charge related to this service. The patient expressed understanding and agreed to proceed.   Reason for visit: sore throat,  Cough   HPI:  64 yr old on chronic immunosuppression for management of Psoriatic arthritis presents for medication refill but has had a several day history of cough,  Sore throat,  Rhinitis,  Sinus pressure after having contact with sick daughter and baby who live in [place].  no fevers  . Feels more tired than usual. .  Symptoms started on Friday  With a headache that lasted two days..   Daughter was tested a month ago was covid negative Taking sudafed,  Zinc,   Was in [place] in early July.   2) Chronic pain:  Patient presents for medication refill on oxycodone taken 1-2 times daily for joint pai n.  Refill history confirmed via [place] Controlled Substance databas, accessed by me today..   ROS: Patient denies headache currently. , fevers,  unintentional weight loss, skin rash, eye pain, , dysphagia,  hemoptysis , dyspnea, wheezing, chest pain, palpitations,  orthopnea, edema, abdominal pain, nausea, melena, diarrhea, constipation, flank pain, dysuria, hematuria, urinary  Frequency, nocturia, numbness, tingling, seizures,  Focal weakness, Loss of consciousness,  Tremor, insomnia, depression, anxiety, and suicidal ideation.      Past Medical History:  Diagnosis Date  . 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 ADHD (attention deficit hyperactivity disorder)   . Allergy   . Anxiety   . Arthritis   . Cataract    bil cateracts removed  . Complication of anesthesia    first colonoscopy pt states she woke up  . Depression   . GERD (gastroesophageal reflux disease)   . Globus sensation   . Hyperlipidemia   . Insomnia   . Multinodular goiter (nontoxic)   . Psoriatic arthritis ([place])   . Spinal stenosis of lumbar region June 2016   MRI   . Statin intolerance 01/27/2013    Past Surgical History:  Procedure Laterality Date  . BACK SURGERY  02/17/2016   Dr. Jacqulynn Cadet- L4-L5 fusion   . BIOPSY THYROID  2014  . COLONOSCOPY W/ POLYPECTOMY    . EYE SURGERY     bilateral cataract surgery w/ lens implant  . FOOT SURGERY  2006   Right foot , secondary to severe loss of joint [facility])  . TONSILLECTOMY    . UPPER GASTROINTESTINAL ENDOSCOPY  2005   With empiric esophageal dilation    Family History  Problem Relation Age of Onset  . Heart disease Mother   . Hyperlipidemia Mother   . Coronary artery disease Father 58  CABG in early 50's  . Hyperlipidemia Father   . Epilepsy Grandchild        Severe form  . Colon cancer Neg Hx   . Esophageal cancer Neg Hx   . Rectal cancer Neg Hx   . Stomach cancer Neg Hx     SOCIAL HX:  reports that she quit smoking about 48 years ago. Her smoking use included cigarettes. She has never used smokeless tobacco. She reports current alcohol use of about 7.0 standard drinks of alcohol per week. She reports that she does not use drugs.   Current Outpatient Medications:  .  aspirin 81 MG tablet, Take 81 mg by  mouth daily.  , Disp: , Rfl:  .  calcium-vitamin D (OSCAL WITH D) 250-125 MG-UNIT tablet, Take 1 tablet by mouth daily., Disp: , Rfl:  .  celecoxib (CELEBREX) 200 MG capsule, Take 1 capsule (200 mg total) by mouth daily., Disp: 90 capsule, Rfl: 1 .  cholecalciferol (VITAMIN D) 1000 UNITS tablet, Take 1,000 Units by mouth daily.  , Disp: , Rfl:  .  clobetasol (TEMOVATE) 0.05 % external solution, APPLY TO AFFECTED AREAS ON SCALP TWICE DAILY UNTIL CLEAR, Disp: , Rfl:  .  diclofenac Sodium (VOLTAREN) 1 % GEL, APPLY 2 GRAMS TO AFFECTED AREA 4 TIMES A DAY, Disp: 300 g, Rfl: 3 .  Etanercept (ENBREL) 25 MG/0.5ML SOLN, Inject into the skin., Disp: , Rfl:  .  fluocinonide ointment (LIDEX) 0.05 %, APPLY TWICE DAILY TO AFFECTED AREAS UNTIL IMPROVED THEN AS NEEDED FOR FLARES, Disp: , Rfl:  .  FLUoxetine (PROZAC) 10 MG capsule, Take 1 capsule (10 mg total) by mouth daily., Disp: 90 capsule, Rfl: 1 .  FLUoxetine (PROZAC) 20 MG capsule, Take 1 capsule (20 mg total) by mouth daily., Disp: 90 capsule, Rfl: 1 .  Lactobacillus (PROBIOTIC ACIDOPHILUS PO), Take 1 capsule by mouth daily., Disp: , Rfl:  .  Lactobacillus-Inulin ([place] PO), Take 1 tablet by mouth daily. , Disp: , Rfl:  .  [START ON 06/21/2020] lisdexamfetamine (VYVANSE) 60 MG capsule, Take 1 capsule (60 mg total) by mouth daily., Disp: 30 capsule, Rfl: 0 .  [START ON 05/24/2020] lisdexamfetamine (VYVANSE) 60 MG capsule, Take 1 capsule (60 mg total) by mouth daily., Disp: 30 capsule, Rfl: 0 .  lisdexamfetamine (VYVANSE) 60 MG capsule, Take 1 capsule (60 mg total) by mouth daily., Disp: 30 capsule, Rfl: 0 .  Multiple Vitamin (MULTIVITAMIN) tablet, Take 1 tablet by mouth daily., Disp: , Rfl:  .  Omega-3 Fatty Acids (FISH OIL) 1200 MG CAPS, Take 1,200 mg by mouth daily. , Disp: , Rfl:  .  [START ON 05/27/2020] oxyCODONE-acetaminophen (PERCOCET/ROXICET) 5-325 MG tablet, Take 1-2 tablets by mouth daily as needed for moderate pain., Disp: 60  tablet, Rfl: 0 .  [START ON 07/26/2020] oxyCODONE-acetaminophen (PERCOCET/ROXICET) 5-325 MG tablet, Take 1-2 tablets by mouth daily as needed for moderate pain., Disp: 60 tablet, Rfl: 0 .  [START ON 06/26/2020] oxyCODONE-acetaminophen (PERCOCET/ROXICET) 5-325 MG tablet, Take 1-2 tablets by mouth daily as needed for moderate pain., Disp: 60 tablet, Rfl: 0 .  Polyethyl Glycol-Propyl Glycol (SYSTANE ULTRA) 0.4-0.3 % SOLN, Place 1 drop into both eyes daily as needed (for dry eyes)., Disp: , Rfl:  .  traZODone (DESYREL) 50 MG tablet, TAKE 1-2 TABLETS BY MOUTH AT BEDTIME., Disp: 180 tablet, Rfl: 0 .  triamcinolone cream (KENALOG) 0.1 %, APPLY TO AFFECTED AREA TWICE A DAY UNTIL CLEAR THEN AS NEEDED, Disp: , Rfl:  .  zolpidem (  AMBIEN) 10 MG tablet, Take 0.5-1 tablets (5-10 mg total) by mouth at bedtime., Disp: 30 tablet, Rfl: 5 .  amoxicillin-clavulanate (AUGMENTIN) 875-125 MG tablet, Take 1 tablet by mouth 2 (two) times daily., Disp: 14 tablet, Rfl: 0 .  chlorpheniramine-HYDROcodone (TUSSIONEX PENNKINETIC ER) 10-8 MG/5ML SUER, Take 5 mLs by mouth at bedtime as needed., Disp: 140 mL, Rfl: 0  EXAM:  VITALS per patient if applicable:  GENERAL: alert, oriented, appears well and in no acute distress  HEENT: atraumatic, conjunttiva clear, no obvious abnormalities on inspection of external nose and ears  NECK: normal movements of the head and neck  LUNGS: on inspection no signs of respiratory distress, breathing rate appears normal, no obvious gross SOB, gasping or wheezing  CV: no obvious cyanosis  MS: moves all visible extremities without noticeable abnormality  PSYCH/NEURO: pleasant and cooperative, no obvious depression or anxiety, speech and thought processing grossly intact  ASSESSMENT AND PLAN:  Discussed the following assessment and plan:  Lower respiratory infection (e.g., bronchitis, pneumonia, pneumonitis, pulmonitis)  Suspected COVID-19 virus infection  Major depressive disorder,  single episode, in remission (H[facility])  Psoriatic arthritis, destructive type ([facility])  Lower respiratory infection (e.g., bronchitis, pneumonia, pneumonitis, pulmonitis) Urged to get tested for COVID ASAP  Suspected COVID-19 virus infection COVID 19 Testing recommended .  Quarantine until status Is known/ Cough suppressant prescribed.  augmentin for use if sinusitis symptoms become positive   Major depressive disorder, single episode, in remission ([place]) Stable, symptoms Managed with prozac by psychiatry. No changes today   Psoriatic arthritis, destructive type Currently managed with Enbrel Had to stop  Cosentyx due to intolerance and recent development of psoriasis diagnosed by dermatology ([place]) which was aggravated by use of a steroid taper. She has had multiple other drug trials and has now been taking Enbrel for the past 15  months. .Continue celebrex qd-bid,  voltaren gel not more than twice daily and continue  To allow use of percocet to #60/month for pain management.  She has not had any ER visits  And has not requested any early refills.  Her Refill history was confirmed via [place] Controlled Substance database by me today during her visit and there have been no prescriptions of controlled substances filled from any providers other than me. .     I discussed the assessment and treatment plan with the patient. The patient was provided an opportunity to ask questions and all were answered. The patient agreed with the plan and demonstrated an understanding of the instructions.   The patient was advised to call back or seek an in-person evaluation if the symptoms worsen or if the condition fails to improve as anticipated.  I provided 30 minutes of non-face-to-face time during this encounter.   Crecencio Mc, MD

## 2020-05-10 NOTE — Assessment & Plan Note (Signed)
Urged to get tested for COVID ASAP

## 2020-05-10 NOTE — Assessment & Plan Note (Signed)
Stable, symptoms Managed with prozac by psychiatry. No changes today

## 2020-05-10 NOTE — Assessment & Plan Note (Signed)
Currently managed with Enbrel Had to stop  Cosentyx due to intolerance and recent development of psoriasis diagnosed by dermatology ([place]) which was aggravated by use of a steroid taper. She has had multiple other drug trials and has now been taking Enbrel for the past 15  months. .Continue celebrex qd-bid,  voltaren gel not more than twice daily and continue  To allow use of percocet to #60/month for pain management.  She has not had any ER visits  And has not requested any early refills.  Her Refill history was confirmed via [place] Controlled Substance database by me today during her visit and there have been no prescriptions of controlled substances filled from any providers other than me. Marland Kitchen

## 2020-05-27 ENCOUNTER — Other Ambulatory Visit: Payer: Self-pay

## 2020-05-27 ENCOUNTER — Ambulatory Visit
Admission: RE | Admit: 2020-05-27 | Discharge: 2020-05-27 | Disposition: A | Discharge status: Home / Self Care | Payer: Medicare HMO | Source: Ambulatory Visit | Attending: Internal Medicine | Admitting: Internal Medicine

## 2020-05-27 DIAGNOSIS — Z1231 Encounter for screening mammogram for malignant neoplasm of breast: Secondary | ICD-10-CM

## 2020-06-18 DIAGNOSIS — Z20822 Contact with and (suspected) exposure to covid-19: Secondary | ICD-10-CM | POA: Diagnosis not present

## 2020-07-08 ENCOUNTER — Other Ambulatory Visit: Payer: Self-pay | Admitting: Internal Medicine

## 2020-07-14 DIAGNOSIS — H524 Presbyopia: Secondary | ICD-10-CM | POA: Diagnosis not present

## 2020-07-16 ENCOUNTER — Ambulatory Visit (INDEPENDENT_AMBULATORY_CARE_PROVIDER_SITE_OTHER): Payer: Medicare HMO

## 2020-07-16 VITALS — Ht 64.5 in | Wt 169.0 lb

## 2020-07-16 DIAGNOSIS — Z1211 Encounter for screening for malignant neoplasm of colon: Secondary | ICD-10-CM

## 2020-07-16 DIAGNOSIS — Z0000 Encounter for general adult medical examination without abnormal findings: Secondary | ICD-10-CM | POA: Diagnosis not present

## 2020-07-16 DIAGNOSIS — Z780 Asymptomatic menopausal state: Secondary | ICD-10-CM

## 2020-07-16 NOTE — Patient Instructions (Addendum)
Ms. Kowalewski , Thank you for taking time to come for your Medicare Wellness Visit. I appreciate your ongoing commitment to your health goals. Please review the following plan we discussed and let me know if I can assist you in the future.   These are the goals we discussed: Goals    . Follow up with provider as needed    . Healthy diet       This is a list of the screening recommended for you and due dates:  Health Maintenance  Topic Date Due  . Pap Smear  01/07/2019  . DEXA scan (bone density measurement)  08/16/2019  . Colon Cancer Screening  05/03/2020  . Flu Shot  01/06/2021*  . Mammogram  05/27/2021  . Pneumonia vaccines (2 of 2 - PPSV23) 07/27/2021  . Tetanus Vaccine  08/09/2022  . COVID-19 Vaccine  Completed  .  Hepatitis C: One time screening is recommended by Center for Disease Control  (CDC) for  adults born from [birth year] through 1967.   Completed  . HIV Screening  Completed  *Topic was postponed. The date shown is not the original due date.    Immunizations Immunization History  Administered Date(s) Administered  . Influenza Split 08/21/2011, 08/09/2012  . Influenza, High Dose Seasonal PF 07/10/2018, 06/09/2019  . Influenza-Unspecified 07/10/2016, 09/14/2017  . PFIZER SARS-COV-2 Vaccination 11/01/2019, 11/23/2019  . Pneumococcal Conjugate-13 07/17/2014  . Pneumococcal Polysaccharide-23 08/21/2011, 07/27/2016  . Tdap 08/09/2012  . Zoster 09/09/2016  . Zoster Recombinat (Shingrix) 06/09/2019   Keep all routine maintenance appointments.   Follow up 08/09/20 @ 1:30  Advanced directives: yes, on file  Follow up in one year for your annual wellness visit    Preventive Care 65 Years and Older, Female Preventive care refers to lifestyle choices and visits with your health care provider that can promote health and wellness. What does preventive care include?  A yearly physical exam. This is also called an annual well check.  Dental exams once or twice a  year.  Routine eye exams. Ask your health care provider how often you should have your eyes checked.  Personal lifestyle choices, including:  Daily care of your teeth and gums.  Regular physical activity.  Eating a healthy diet.  Avoiding tobacco and drug use.  Limiting alcohol use.  Practicing safe sex.  Taking low-dose aspirin every day.  Taking vitamin and mineral supplements as recommended by your health care provider. What happens during an annual well check? The services and screenings done by your health care provider during your annual well check will depend on your age, overall health, lifestyle risk factors, and family history of disease. Counseling  Your health care provider may ask you questions about your:  Alcohol use.  Tobacco use.  Drug use.  Emotional well-being.  Home and relationship well-being.  Sexual activity.  Eating habits.  History of falls.  Memory and ability to understand (cognition).  Work and work Statistician.  Reproductive health. Screening  You may have the following tests or measurements:  Height, weight, and BMI.  Blood pressure.  Lipid and cholesterol levels. These may be checked every 5 years, or more frequently if you are over 39 years old.  Skin check.  Lung cancer screening. You may have this screening every year starting at age 100 if you have a 30-pack-year history of smoking and currently smoke or have quit within the past 15 years.  Fecal occult blood test (FOBT) of the stool. You may have this test every  year starting at age 43.  Flexible sigmoidoscopy or colonoscopy. You may have a sigmoidoscopy every 5 years or a colonoscopy every 10 years starting at age 25.  Hepatitis C blood test.  Hepatitis B blood test.  Sexually transmitted disease (STD) testing.  Diabetes screening. This is done by checking your blood sugar (glucose) after you have not eaten for a while (fasting). You may have this done every 1-3  years.  Bone density scan. This is done to screen for osteoporosis. You may have this done starting at age 54.  Mammogram. This may be done every 1-2 years. Talk to your health care provider about how often you should have regular mammograms. Talk with your health care provider about your test results, treatment options, and if necessary, the need for more tests. Vaccines  Your health care provider may recommend certain vaccines, such as:  Influenza vaccine. This is recommended every year.  Tetanus, diphtheria, and acellular pertussis (Tdap, Td) vaccine. You may need a Td booster every 10 years.  Zoster vaccine. You may need this after age 43.  Pneumococcal 13-valent conjugate (PCV13) vaccine. One dose is recommended after age 49.  Pneumococcal polysaccharide (PPSV23) vaccine. One dose is recommended after age 71. Talk to your health care provider about which screenings and vaccines you need and how often you need them. This information is not intended to replace advice given to you by your health care provider. Make sure you discuss any questions you have with your health care provider. Document Released: 10/22/2015 Document Revised: 06/14/2016 Document Reviewed: 07/27/2015 Elsevier Interactive Patient Education  2017 [place] Prevention in the Home Falls can cause injuries. They can happen to people of all ages. There are many things you can do to make your home safe and to help prevent falls. What can I do on the outside of my home?  Regularly fix the edges of walkways and driveways and fix any cracks.  Remove anything that might make you trip as you walk through a door, such as a raised step or threshold.  Trim any bushes or trees on the path to your home.  Use bright outdoor lighting.  Clear any walking paths of anything that might make someone trip, such as rocks or tools.  Regularly check to see if handrails are loose or broken. Make sure that both sides of any  steps have handrails.  Any raised decks and porches should have guardrails on the edges.  Have any leaves, snow, or ice cleared regularly.  Use sand or salt on walking paths during winter.  Clean up any spills in your garage right away. This includes oil or grease spills. What can I do in the bathroom?  Use night lights.  Install grab bars by the toilet and in the tub and shower. Do not use towel bars as grab bars.  Use non-skid mats or decals in the tub or shower.  If you need to sit down in the shower, use a plastic, non-slip stool.  Keep the floor dry. Clean up any water that spills on the floor as soon as it happens.  Remove soap buildup in the tub or shower regularly.  Attach bath mats securely with double-sided non-slip rug tape.  Do not have throw rugs and other things on the floor that can make you trip. What can I do in the bedroom?  Use night lights.  Make sure that you have a light by your bed that is easy to reach.  Do  not use any sheets or blankets that are too big for your bed. They should not hang down onto the floor.  Have a firm chair that has side arms. You can use this for support while you get dressed.  Do not have throw rugs and other things on the floor that can make you trip. What can I do in the kitchen?  Clean up any spills right away.  Avoid walking on wet floors.  Keep items that you use a lot in easy-to-reach places.  If you need to reach something above you, use a strong step stool that has a grab bar.  Keep electrical cords out of the way.  Do not use floor polish or wax that makes floors slippery. If you must use wax, use non-skid floor wax.  Do not have throw rugs and other things on the floor that can make you trip. What can I do with my stairs?  Do not leave any items on the stairs.  Make sure that there are handrails on both sides of the stairs and use them. Fix handrails that are broken or loose. Make sure that handrails are  as long as the stairways.  Check any carpeting to make sure that it is firmly attached to the stairs. Fix any carpet that is loose or worn.  Avoid having throw rugs at the top or bottom of the stairs. If you do have throw rugs, attach them to the floor with carpet tape.  Make sure that you have a light switch at the top of the stairs and the bottom of the stairs. If you do not have them, ask someone to add them for you. What else can I do to help prevent falls?  Wear shoes that:  Do not have high heels.  Have rubber bottoms.  Are comfortable and fit you well.  Are closed at the toe. Do not wear sandals.  If you use a stepladder:  Make sure that it is fully opened. Do not climb a closed stepladder.  Make sure that both sides of the stepladder are locked into place.  Ask someone to hold it for you, if possible.  Clearly mark and make sure that you can see:  Any grab bars or handrails.  First and last steps.  Where the edge of each step is.  Use tools that help you move around (mobility aids) if they are needed. These include:  Canes.  Walkers.  Scooters.  Crutches.  Turn on the lights when you go into a dark area. Replace any light bulbs as soon as they burn out.  Set up your furniture so you have a clear path. Avoid moving your furniture around.  If any of your floors are uneven, fix them.  If there are any pets around you, be aware of where they are.  Review your medicines with your doctor. Some medicines can make you feel dizzy. This can increase your chance of falling. Ask your doctor what other things that you can do to help prevent falls. This information is not intended to replace advice given to you by your health care provider. Make sure you discuss any questions you have with your health care provider. Document Released: 07/22/2009 Document Revised: 03/02/2016 Document Reviewed: 10/30/2014 Elsevier Interactive Patient Education  2017 Else[employer]   Bone Density Test The bone density test uses a special type of X-ray to measure the amount of calcium and other minerals in your bones. It can measure bone density in  the hip and the spine. The test procedure is similar to having a regular X-ray. This test may also be called:  Bone densitometry.  Bone mineral density test.  Dual-energy X-ray absorptiometry (DEXA). You may have this test to:  Diagnose a condition that causes weak or thin bones (osteoporosis).  Screen you for osteoporosis.  Predict your risk for a broken bone (fracture).  Determine how well your osteoporosis treatment is working. Tell a health care provider about:  Any allergies you have.  All medicines you are taking, including vitamins, herbs, eye drops, creams, and over-the-counter medicines.  Any problems you or family members have had with anesthetic medicines.  Any blood disorders you have.  Any surgeries you have had.  Any medical conditions you have.  Whether you are pregnant or may be pregnant.  Any medical tests you have had within the past 14 days that used contrast material. What are the risks? Generally, this is a safe procedure. However, it does expose you to a small amount of radiation, which can slightly increase your cancer risk. What happens before the procedure?  Do not take any calcium supplements starting 24 hours before your test.  Remove all metal jewelry, eyeglasses, dental appliances, and any other metal objects. What happens during the procedure?   You will lie down on an exam table. There will be an X-ray generator below you and an imaging device above you.  Other devices, such as boxes or braces, may be used to position your body properly for the scan.  The machine will slowly scan your body. You will need to keep still.  The images will show up on a screen in the room. Images will be examined by a specialist after your test is done. The procedure may vary among  health care providers and hospitals. What happens after the procedure?  It is up to you to get your test results. Ask your health care provider, or the department that is doing the test, when your results will be ready. Summary  A bone density test is an imaging test that uses a type of X-ray to measure the amount of calcium and other minerals in your bones.  The test may be used to diagnose or screen you for a condition that causes weak or thin bones (osteoporosis), predict your risk for a broken bone (fracture), or determine how well your osteoporosis treatment is working.  Do not take any calcium supplements starting 24 hours before your test.  Ask your health care provider, or the department that is doing the test, when your results will be ready. This information is not intended to replace advice given to you by your health care provider. Make sure you discuss any questions you have with your health care provider. Document Revised: 10/11/2017 Document Reviewed: 07/30/2017 Elsevier Patient Education  [place].   Colonoscopy, Adult A colonoscopy is a procedure to look at the entire large intestine. This procedure is done using a long, thin, flexible tube that has a camera on the end. You may have a colonoscopy:  As a part of normal colorectal screening.  If you have certain symptoms, such as: ? A low number of red blood cells in your blood (anemia). ? Diarrhea that does not go away. ? Pain in your abdomen. ? Blood in your stool. A colonoscopy can help screen for and diagnose medical problems, including:  Tumors.  Extra tissue that grows where mucus forms (polyps).  Inflammation.  Areas of bleeding. Tell your  health care provider about:  Any allergies you have.  All medicines you are taking, including vitamins, herbs, eye drops, creams, and over-the-counter medicines.  Any problems you or family members have had with anesthetic medicines.  Any blood disorders  you have.  Any surgeries you have had.  Any medical conditions you have.  Any problems you have had with having bowel movements.  Whether you are pregnant or may be pregnant. What are the risks? Generally, this is a safe procedure. However, problems may occur, including:  Bleeding.  Damage to your intestine.  Allergic reactions to medicines given during the procedure.  Infection. This is rare. What happens before the procedure? Eating and drinking restrictions Follow instructions from your health care provider about eating or drinking restrictions, which may include:  A few days before the procedure: ? Follow a low-fiber diet. ? Avoid nuts, seeds, dried fruit, raw fruits, and vegetables.  1-3 days before the procedure: ? Eat only gelatin dessert or ice pops. ? Drink only clear liquids, such as water, clear juice, clear broth or bouillon, black coffee or tea, or clear soft drinks or sports drinks. ? Avoid liquids that contain red or purple dye.  The day of the procedure: ? Do not eat solid foods. You may continue to drink clear liquids until up to 2 hours before the procedure. ? Do not eat or drink anything starting 2 hours before the procedure, or within the time period that your health care provider recommends. Bowel prep If you were prescribed a bowel prep to take by mouth (orally) to clean out your colon:  Take it as told by your health care provider. Starting the day before your procedure, you will need to drink a large amount of liquid medicine. The liquid will cause you to have many bowel movements of loose stool until your stool becomes almost clear or light green.  If your skin or the opening between the buttocks (anus) gets irritated from diarrhea, you may relieve the irritation using: ? Wipes with medicine in them, such as adult wet wipes with aloe and vitamin E. ? A product to soothe skin, such as petroleum jelly.  If you vomit while drinking the bowel  prep: ? Take a break for up to 60 minutes. ? Begin the bowel prep again. ? Call your health care provider if you keep vomiting or you cannot take the bowel prep without vomiting.  To clean out your colon, you may also be given: ? Laxative medicines. These help you have a bowel movement. ? Instructions for enema use. An enema is liquid medicine injected into your rectum. Medicines Ask your health care provider about:  Changing or stopping your regular medicines or supplements. This is especially important if you are taking iron supplements, diabetes medicines, or blood thinners.  Taking medicines such as aspirin and ibuprofen. These medicines can thin your blood. Do not take these medicines unless your health care provider tells you to take them.  Taking over-the-counter medicines, vitamins, herbs, and supplements. General instructions  Ask your health care provider what steps will be taken to help prevent infection. These may include washing skin with a germ-killing soap.  Plan to have someone take you home from the hospital or clinic. What happens during the procedure?   An IV will be inserted into one of your veins.  You may be given one or more of the following: ? A medicine to help you relax (sedative). ? A medicine to numb the area (local  anesthetic). ? A medicine to make you fall asleep (general anesthetic). This is rarely needed.  You will lie on your side with your knees bent.  The tube will: ? Have oil or gel put on it (be lubricated). ? Be inserted into your anus. ? Be gently eased through all parts of your large intestine.  Air will be sent into your colon to keep it open. This may cause some pressure or cramping.  Images will be taken with the camera and will appear on a screen.  A small tissue sample may be removed to be looked at under a microscope (biopsy). The tissue may be sent to a lab for testing if any signs of problems are found.  If small polyps are  found, they may be removed and checked for cancer cells.  When the procedure is finished, the tube will be removed. The procedure may vary among health care providers and hospitals. What happens after the procedure?  Your blood pressure, heart rate, breathing rate, and blood oxygen level will be monitored until you leave the hospital or clinic.  You may have a small amount of blood in your stool.  You may pass gas and have mild cramping or bloating in your abdomen. This is caused by the air that was used to open your colon during the exam.  Do not drive for 24 hours after the procedure.  It is up to you to get the results of your procedure. Ask your health care provider, or the department that is doing the procedure, when your results will be ready. Summary  A colonoscopy is a procedure to look at the entire large intestine.  Follow instructions from your health care provider about eating and drinking before the procedure.  If you were prescribed an oral bowel prep to clean out your colon, take it as told by your health care provider.  During the colonoscopy, a flexible tube with a camera on its end is inserted into the anus and then passed into the other parts of the large intestine. This information is not intended to replace advice given to you by your health care provider. Make sure you discuss any questions you have with your health care provider. Document Revised: 04/18/2019 Document Reviewed: 04/18/2019 Elsevier Patient Education  [place].

## 2020-07-16 NOTE — Progress Notes (Addendum)
Subjective:   Madeline Young is a 64 y.o. female who presents for Medicare Annual (Subsequent) preventive examination.  Review of Systems    No ROS.  Medicare Wellness Virtual Visit.    Cardiac Risk Factors include: advanced age (>36men, >110 women)     Objective:    Today's Vitals   07/16/20 1017  Weight: 169 lb (76.7 kg)  Height: 5' 4.5" (1.638 m)   Body mass index is 28.56 kg/m.  Advanced Directives 07/16/2020 07/16/2019 07/10/2018 03/17/2016 02/09/2016 02/10/2015  Does Patient Have a Medical Advance Directive? Yes Yes Yes No No No  Type of Paramedic of [place];Living will [place];Living will [place];Living will - - -  Does patient want to make changes to medical advance directive? No - Patient declined No - Patient declined No - Patient declined - - -  Copy of [place] in Chart? Yes - validated most recent copy scanned in chart (See row information) No - copy requested No - copy requested - - -  Would patient like information on creating a medical advance directive? - - - No - patient declined information Yes - Educational materials given Yes - Educational materials given    Current Medications (verified) Outpatient Encounter Medications as of 07/16/2020  Medication Sig   calcium-vitamin D (OSCAL WITH D) 250-125 MG-UNIT tablet Take 1 tablet by mouth daily.   celecoxib (CELEBREX) 200 MG capsule Take 1 capsule by mouth once daily   FLUoxetine (PROZAC) 20 MG capsule Take 1 capsule (20 mg total) by mouth daily.   Lactobacillus (PROBIOTIC ACIDOPHILUS PO) Take 1 capsule by mouth daily.   Lactobacillus-Inulin (CULTURELLE DIGESTIVE HEALTH PO) Take 1 tablet by mouth daily.    lisdexamfetamine (VYVANSE) 60 MG capsule Take 1 capsule (60 mg total) by mouth daily.   lisdexamfetamine (VYVANSE) 60 MG capsule Take 1 capsule (60 mg total) by mouth daily.   lisdexamfetamine (VYVANSE) 60 MG capsule Take 1  capsule (60 mg total) by mouth daily.   Multiple Vitamin (MULTIVITAMIN) tablet Take 1 tablet by mouth daily.   Omega-3 Fatty Acids (FISH OIL) 1200 MG CAPS Take 1,200 mg by mouth daily.    oxyCODONE-acetaminophen (PERCOCET/ROXICET) 5-325 MG tablet Take 1-2 tablets by mouth daily as needed for moderate pain.   [START ON 07/26/2020] oxyCODONE-acetaminophen (PERCOCET/ROXICET) 5-325 MG tablet Take 1-2 tablets by mouth daily as needed for moderate pain.   oxyCODONE-acetaminophen (PERCOCET/ROXICET) 5-325 MG tablet Take 1-2 tablets by mouth daily as needed for moderate pain.   Polyethyl Glycol-Propyl Glycol (SYSTANE ULTRA) 0.4-0.3 % SOLN Place 1 drop into both eyes daily as needed (for dry eyes).   traZODone (DESYREL) 50 MG tablet TAKE 1-2 TABLETS BY MOUTH AT BEDTIME.   triamcinolone cream (KENALOG) 0.1 % APPLY TO AFFECTED AREA TWICE A DAY UNTIL CLEAR THEN AS NEEDED   zolpidem (AMBIEN) 10 MG tablet Take 0.5-1 tablets (5-10 mg total) by mouth at bedtime.   aspirin 81 MG tablet Take 81 mg by mouth daily.     chlorpheniramine-HYDROcodone (TUSSIONEX PENNKINETIC ER) 10-8 MG/5ML SUER Take 5 mLs by mouth at bedtime as needed.   cholecalciferol (VITAMIN D) 1000 UNITS tablet Take 1,000 Units by mouth daily.     clobetasol (TEMOVATE) 0.05 % external solution APPLY TO AFFECTED AREAS ON SCALP TWICE DAILY UNTIL CLEAR   diclofenac Sodium (VOLTAREN) 1 % GEL APPLY 2 GRAMS TO AFFECTED AREA 4 TIMES A DAY   Etanercept (ENBREL) 25 MG/0.5ML SOLN Inject into  the skin. (Patient not taking: Reported on 07/16/2020)   fluocinonide ointment (LIDEX) 0.05 % APPLY TWICE DAILY TO AFFECTED AREAS UNTIL IMPROVED THEN AS NEEDED FOR FLARES   FLUoxetine (PROZAC) 10 MG capsule Take 1 capsule (10 mg total) by mouth daily. (Patient not taking: Reported on 07/16/2020)   [DISCONTINUED] amoxicillin-clavulanate (AUGMENTIN) 875-125 MG tablet Take 1 tablet by mouth 2 (two) times daily.   No facility-administered encounter medications on file as of  07/16/2020.    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 Jacqulynn Cadet- L4-L5 fusion    BIOPSY THYROID  2014   COLONOSCOPY W/ POLYPECTOMY     EYE SURGERY     bilateral cataract surgery w/ lens implant   FOOT SURGERY  2006   Right foot , secondary to severe loss of joint ([place])   TONSILLECTOMY     UPPER GASTROINTESTINAL ENDOSCOPY  2005   With empiric esophageal dilation   Family History  Problem Relation Age of Onset   Heart disease Mother    Hyperlipidemia Mother    Coronary artery disease Father 34       CABG in early 78's   Hyperlipidemia Father    Epilepsy Grandchild        Severe form   Colon cancer Neg Hx    Esophageal cancer Neg Hx    Rectal cancer Neg Hx    Stomach cancer Neg Hx    Social History   Socioeconomic History   Marital status: Married    Spouse name: Not on file   Number of children: 2   Years of education: Not on file   Highest education level: Not on file  Occupational History   Occupation: disability  Tobacco Use   Smoking status: Former Smoker    Types: Cigarettes    Quit date: 08/21/1971    Years since quitting: 48.9   Smokeless tobacco: Never Used  Vaping Use   Vaping Use: Never used  Substance and Sexual  Activity   Alcohol use: Yes    Alcohol/week: 7.0 standard drinks    Types: 7 Glasses of wine per week   Drug use: No   Sexual activity: Not on file  Other Topics Concern   Not on file  Social History Narrative   Married, retired Therapist, sports   2 daughters some grandchildren   2 caffeinated drinks daily   1 alcoholic beverage daily   No tobacco   04/29/2015   Social Determinants of Health   Financial Resource Strain: Low Risk    Difficulty of Paying Living Expenses: Not hard at all  Food Insecurity: No Food Insecurity   Worried About Charity fundraiser in the  Last Year: Never true   Ran Out of Food in the Last Year: Never true  Transportation Needs: No Transportation Needs   Lack of Transportation (Medical): No   Lack of Transportation (Non-Medical): No  Physical Activity:    Days of Exercise per Week: Not on file   Minutes of Exercise per Session: Not on file  Stress: No Stress Concern Present   Feeling of Stress : Not at all  Social Connections: Unknown   Frequency of Communication with Friends and Family: Not on file   Frequency of Social Gatherings with Friends and Family: Not on file   Attends Religious Services: Not on Electrical engineer or Organizations: Not on file   Attends Archivist Meetings: Not on file   Marital Status: Married    Tobacco Counseling Counseling given: Not Answered   Clinical Intake:  Pre-visit preparation completed: Yes        Diabetes: No  How often do you need to have someone help you when you read instructions, pamphlets, or other written materials from your doctor or pharmacy?: 1 - Never  Interpreter Needed?: No      Activities of Daily Living In your present state of health, do you have any difficulty performing the following activities: 07/16/2020  Hearing? N  Vision? N  Difficulty concentrating or making decisions? N  Walking or climbing stairs? N  Dressing or bathing? N  Doing errands, shopping? N    Preparing Food and eating ? N  Using the Toilet? N  In the past six months, have you accidently leaked urine? N  Do you have problems with loss of bowel control? N  Managing your Medications? N  Managing your Finances? N  Housekeeping or managing your Housekeeping? N  Some recent data might be hidden    Patient Care Team: Crecencio Mc, MD as PCP - General (Internal Medicine)  Indicate any recent Medical Services you may have received from other than [facility] providers in the past year (date may be approximate).     Assessment:   This is a routine wellness examination for Dreyah.  I connected with Milicent today by telephone and verified that I am speaking with the correct person using two identifiers. Location patient: home Location provider: work Persons participating in the virtual visit: patient, Marine scientist.    I discussed the limitations, risks, security and privacy concerns of performing an evaluation and management service by telephone and the availability of in person appointments. The patient expressed understanding and verbally consented to this telephonic visit.    Interactive audio and video telecommunications were attempted between this provider and patient, however failed, due to patient having technical difficulties OR patient did not have access to video capability.  We continued and completed visit with audio only.  Some vital signs may be absent or patient reported.   Hearing/Vision screen  Hearing Screening   125Hz  250Hz  500Hz  1000Hz  2000Hz  3000Hz  4000Hz  6000Hz  8000Hz   Right ear:           Left ear:           Comments: Patient is able to hear conversational tones without difficulty. No issues reported.  Vision Screening Comments: Wears corrective lenses  Cataract extraction, bilateral  Visual acuity not assessed, virtual visit.  Dietary issues and exercise activities discussed: Current Exercise Habits: Home exercise routine, Type of exercise:  yoga;stretching, Intensity: Mild Healthy Diet Good water intake   Goals      Follow up  with provider as needed     Healthy diet       Depression Screen PHQ 2/9 Scores 07/16/2020 05/10/2020 11/10/2019 07/16/2019 07/10/2018 12/31/2017 10/27/2016  PHQ - 2 Score 0 0 3 0 0 2 3  PHQ- 9 Score - 1 10 - - 4 7    Fall Risk Fall Risk  07/16/2020 05/10/2020 02/06/2020 01/28/2020 11/10/2019  Falls in the past year? 0 0 0 0 1  Number falls in past yr: 0 - - - 0  Injury with Fall? - - - - 0  Follow up Falls evaluation completed Falls evaluation completed Falls evaluation completed Falls evaluation completed Falls evaluation completed   Handrails in use when climbing stairs? Yes Home free of loose throw rugs in walkways, pet beds, electrical cords, etc? Yes  Adequate lighting in your home to reduce risk of falls? Yes   ASSISTIVE DEVICES UTILIZED TO PREVENT FALLS: Use of a cane, walker or w/c? No   TIMED UP AND GO: Was the test performed? No . Virtual visit.   Cognitive Function: MMSE - Mini Mental State Exam 07/10/2018  Orientation to time 5  Orientation to Place 5  Registration 3  Attention/ Calculation 5  Recall 3  Language- name 2 objects 2  Language- repeat 1  Language- follow 3 step command 3  Language- read & follow direction 1  Write a sentence 1  Copy design 1  Total score 30     6CIT Screen 07/16/2019  What Year? 0 points  What month? 0 points  What time? 0 points  Count back from 20 0 points  Months in reverse 0 points  Repeat phrase 0 points  Total Score 0   Immunizations Immunization History  Administered Date(s) Administered   Influenza Split 08/21/2011, 08/09/2012   Influenza, High Dose Seasonal PF 07/10/2018, 06/09/2019   Influenza-Unspecified 07/10/2016, 09/14/2017   PFIZER SARS-COV-2 Vaccination 11/01/2019, 11/23/2019   Pneumococcal Conjugate-13 07/17/2014   Pneumococcal Polysaccharide-23 08/21/2011, 07/27/2016   Tdap 08/09/2012   Zoster 09/09/2016   Zoster  Recombinat (Shingrix) 06/09/2019   Health Maintenance Health Maintenance  Topic Date Due   PAP SMEAR-Modifier  01/07/2019   DEXA SCAN  08/16/2019   COLONOSCOPY  05/03/2020   INFLUENZA VACCINE  01/06/2021 (Originally 05/09/2020)   MAMMOGRAM  05/27/2021   PNA vac Low Risk Adult (2 of 2 - PPSV23) 07/27/2021   TETANUS/TDAP  08/09/2022   COVID-19 Vaccine  Completed   Hepatitis C Screening  Completed   HIV Screening  Completed   Dental Screening: Recommended annual dental exams for proper oral hygiene. Night guard worn. Regular visits.   Colonoscopy- discussed; ordered  Dexa Scan- discussed; ordered  Influenza vaccine- plans to receive at upcoming appointment. Deferred.   Pap Smear- followed by pcp; plans to complete later in the season.   Community Resource Referral / Chronic Care Management: CRR required this visit?  No   CCM required this visit?  No      Plan:   Keep all routine maintenance appointments.   Follow up 08/09/20 @ 1:30  I have personally reviewed and noted the following in the patient's chart:   Medical and social history Use of alcohol, tobacco or illicit drugs  Current medications and supplements Functional ability and status Nutritional status Physical activity Advanced directives List of other physicians Hospitalizations, surgeries, and ER visits in previous 12 months Vitals Screenings to include cognitive, depression, and falls Referrals and appointments  In addition, I have reviewed and discussed with patient certain preventive  protocols, quality metrics, and best practice recommendations. A written personalized care plan for preventive services as well as general preventive health recommendations were provided to patient via mychart.     OBrien-Blaney, Nathalya Wolanski L, LPN   [id number]     I have reviewed the above information and agree with above.   Deborra Medina, MD

## 2020-07-21 DIAGNOSIS — L905 Scar conditions and fibrosis of skin: Secondary | ICD-10-CM | POA: Diagnosis not present

## 2020-07-21 DIAGNOSIS — L309 Dermatitis, unspecified: Secondary | ICD-10-CM | POA: Diagnosis not present

## 2020-07-21 DIAGNOSIS — L089 Local infection of the skin and subcutaneous tissue, unspecified: Secondary | ICD-10-CM | POA: Diagnosis not present

## 2020-07-21 DIAGNOSIS — L400 Psoriasis vulgaris: Secondary | ICD-10-CM | POA: Diagnosis not present

## 2020-07-28 ENCOUNTER — Encounter: Payer: Self-pay | Admitting: *Deleted

## 2020-07-29 ENCOUNTER — Telehealth: Payer: Self-pay | Admitting: Internal Medicine

## 2020-07-29 NOTE — Telephone Encounter (Signed)
Rejection Reason - Patient did not respond" [facility] said on Jul 29, 2020 10:13 AM  Mailed "unable to contact" letter to home and referring provider.

## 2020-08-02 ENCOUNTER — Ambulatory Visit (INDEPENDENT_AMBULATORY_CARE_PROVIDER_SITE_OTHER): Payer: Medicare HMO | Admitting: Psychiatry

## 2020-08-02 ENCOUNTER — Encounter: Payer: Self-pay | Admitting: Psychiatry

## 2020-08-02 ENCOUNTER — Other Ambulatory Visit: Payer: Self-pay

## 2020-08-02 DIAGNOSIS — R69 Illness, unspecified: Secondary | ICD-10-CM | POA: Diagnosis not present

## 2020-08-02 DIAGNOSIS — F900 Attention-deficit hyperactivity disorder, predominantly inattentive type: Secondary | ICD-10-CM

## 2020-08-02 DIAGNOSIS — F3342 Major depressive disorder, recurrent, in full remission: Secondary | ICD-10-CM

## 2020-08-02 DIAGNOSIS — F5105 Insomnia due to other mental disorder: Secondary | ICD-10-CM

## 2020-08-02 MED ORDER — LISDEXAMFETAMINE DIMESYLATE 50 MG PO CAPS: 50.0000 mg | ORAL_CAPSULE | Freq: Every day | ORAL | 0 refills | Status: DC

## 2020-08-02 NOTE — Progress Notes (Signed)
Madeline Young [id number] 14-Jan-1956 64 y.o. Virtual Visit via [place]  I connected with pt by WebEx and verified that I am speaking with the correct person using two identifiers.   I discussed the limitations, risks, security and privacy concerns of performing an evaluation and management service by Jackquline [facility] and the availability of in person appointments. I also discussed with the patient that there may be a patient responsible charge related to this service. The patient expressed understanding and agreed to proceed.  I discussed the assessment and treatment plan with the patient. The patient was provided an opportunity to ask questions and all were answered. The patient agreed with the plan and demonstrated an understanding of the instructions.   The patient was advised to call back or seek an in-person evaluation if the symptoms worsen or if the condition fails to improve as anticipated.  I provided 30 minutes of video time during this encounter. The call started at 100 and ended at 1:30. The patient was located at home and the provider was located office.   Subjective:   Patient ID:  Madeline Young is a 64 y.o. (DOB 10-06-1956) female.  Chief Complaint:  Chief Complaint  Patient presents with  . Follow-up  . Depression  . Anxiety  . ADHD  . Sleeping Problem    Depression        Associated symptoms include no decreased concentration and no suicidal ideas.  Madeline Young presents to the office today for follow-up of ADD and anxiety and depression and sleep.  seen August 2020.  No meds were changed.  On Vyvanse 50, fluoxetine 20, and Ambien 5.  Last seen November 30, 2019.  The following was noted: "Down".  Doesn't feel Vyvanse helping as much.  More scattered and doesn't finish things.  No SE with it. Sleep pattern is worse as Covid progressed.  Had reduced Zolpidem to 5 mg but then increased to 7.5 mg HS.  If takes Benadryl but can sleep but then has crying spells  after it and hangover.  In the AM feels more negative and tearful.  AM is hard bc stiff in the morning. Plan:Continue Vyvanse 50 mg AM Increase fluoxetine to 30 mg daily.  She doesn't want to go to 40 bc fear of serotonin syndrome.  Thinks she had some of those sx in the past Continue zolpidem 5 mg daily.  01/29/20 appt, reported:  Better with increased fluoxetine re: mood. No SE with meds. Still ADD problems not as well controlled as she'd like. Still problems with sleep and doesn't think ambien workiing as well as it should.  Tried trazodone with Ambien but had hangover and difficulty waking up and not a deep good sleep.  Never tried trazodone alone.  Increased Ambien on her own to 10 mg and ran out early.  Reduced to 5 mg and added Benadryl.  Erratic sleep since Covid.  Go to bed same time 10.  Avoids alcohol 2 hours before. Primary problem is going to sleep.  History of 10 mg Ambien for years.  Says 7.5 mg Ambien will work. Caffeine none after noon. Plan: Increase Vyvanse 60 mg AM continue fluoxetine to 30 mg daily.  She doesn't want to go to 40 bc fear of serotonin syndrome.  Thinks she had some of those sx in the past.  It helped to increase Increase zolpidem 5-10 mg daily.  08/02/2020 appointment with the following noted: Went back down to 20 mg fluoxetine bc of more HA and  fear of serotonin syndrome bc she feels she had it at some point in the past.  Taking 20 mg for mos. Still problems with sleep.  Down to 2.5 mg Ambien bc when takes more feels out of it the next day.   Thinks taking Ambien too long. Too much awakening. No marked benefit with increased Vyvanse to 60 mg and feels more irritable and edgy. However still distractible and inefficient and hard to finish things. Forgetful and loses things.  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  Past Psychiatric Medication Trials:  Zolpidem, trazodone  hangover Fluoxetine 60, sertraline NR, duloxetine, Lexapro, Wellbutrin, buspirone,  Ritalin LA, Concerta 54, Adderall 20 dizzy, Vyvanse 60 Under care at this office since July 2002   Review of Systems:  Review of Systems  HENT: Positive for ear pain.   Cardiovascular: Negative for palpitations.  Musculoskeletal: Positive for arthralgias and back pain.  Skin: Positive for rash.  Neurological: Negative for dizziness, tremors and weakness.  Psychiatric/Behavioral: Positive for depression and sleep disturbance. Negative for agitation, behavioral problems, confusion, decreased concentration, dysphoric mood, hallucinations, self-injury and suicidal ideas. The patient is not nervous/anxious and is not hyperactive.     Medications: I have reviewed the patient's current medications.  Current Outpatient Medications  Medication Sig Dispense Refill  . aspirin 81 MG tablet Take 81 mg by mouth daily.      . calcium-vitamin D (OSCAL WITH D) 250-125 MG-UNIT tablet Take 1 tablet by mouth daily.    . celecoxib (CELEBREX) 200 MG capsule Take 1 capsule by mouth once daily 90 capsule 0  . chlorpheniramine-HYDROcodone (TUSSIONEX PENNKINETIC ER) 10-8 MG/5ML SUER Take 5 mLs by mouth at bedtime as needed. 140 mL 0  . cholecalciferol (VITAMIN D) 1000 UNITS tablet Take 1,000 Units by mouth daily.      . clobetasol (TEMOVATE) 0.05 % external solution APPLY TO AFFECTED AREAS ON SCALP TWICE DAILY UNTIL CLEAR    . diclofenac Sodium (VOLTAREN) 1 % GEL APPLY 2 GRAMS TO AFFECTED AREA 4 TIMES A DAY 300 g 3  . Etanercept (ENBREL) 25 MG/0.5ML SOLN Inject into the skin.     . fluocinonide ointment (LIDEX) 0.05 % APPLY TWICE DAILY TO AFFECTED AREAS UNTIL IMPROVED THEN AS NEEDED FOR FLARES    . FLUoxetine (PROZAC) 20 MG capsule Take 1 capsule (20 mg total) by mouth daily. 90 capsule 1  . Lactobacillus (PROBIOTIC ACIDOPHILUS PO) Take 1 capsule by mouth daily.    . Lactobacillus-Inulin (CULTURELLE DIGESTIVE HEALTH PO) Take 1  tablet by mouth daily.     . Multiple Vitamin (MULTIVITAMIN) tablet Take 1 tablet by mouth daily.    . Omega-3 Fatty Acids (FISH OIL) 1200 MG CAPS Take 1,200 mg by mouth daily.     Marland Kitchen oxyCODONE-acetaminophen (PERCOCET/ROXICET) 5-325 MG tablet Take 1-2 tablets by mouth daily as needed for moderate pain. 60 tablet 0  . oxyCODONE-acetaminophen (PERCOCET/ROXICET) 5-325 MG tablet Take 1-2 tablets by mouth daily as needed for moderate pain. 60 tablet 0  . oxyCODONE-acetaminophen (PERCOCET/ROXICET) 5-325 MG tablet Take 1-2 tablets by mouth daily as needed for moderate pain. 60 tablet 0  . Polyethyl Glycol-Propyl Glycol (SYSTANE ULTRA) 0.4-0.3 % SOLN Place 1 drop into both eyes daily as needed (for dry eyes).    . traZODone (DESYREL) 50 MG tablet TAKE 1-2 TABLETS BY MOUTH AT BEDTIME. 180 tablet 0  . triamcinolone cream (KENALOG) 0.1 % APPLY TO AFFECTED AREA  TWICE A DAY UNTIL CLEAR THEN AS NEEDED    . zolpidem (AMBIEN) 10 MG tablet Take 0.5-1 tablets (5-10 mg total) by mouth at bedtime. 30 tablet 5  . [START ON 09/27/2020] lisdexamfetamine (VYVANSE) 50 MG capsule Take 1 capsule (50 mg total) by mouth daily. 30 capsule 0  . [START ON 08/30/2020] lisdexamfetamine (VYVANSE) 50 MG capsule Take 1 capsule (50 mg total) by mouth daily. 30 capsule 0  . lisdexamfetamine (VYVANSE) 50 MG capsule Take 1 capsule (50 mg total) by mouth daily. 30 capsule 0   No current facility-administered medications for this visit.    Medication Side Effects: None  Allergies:  Allergies  Allergen Reactions  . Cosentyx [Secukinumab] Rash    Severe case of psorasis  . Sertraline Hcl Other (See Comments)    Made me crazy   . Avelox [Moxifloxacin Hcl In Nacl]     Pt cant remember reaction  . Latex Itching and Rash    Redness  . Macrobid [employer] Macro] Other (See Comments)    tired  . Statins Other (See Comments)    Muscle aches    Past Medical History:  Diagnosis Date  . 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 ADHD (attention deficit hyperactivity disorder)   . Allergy   . Anxiety   . Arthritis   . Cataract    bil cateracts removed  . Complication of anesthesia    first colonoscopy pt states she woke up  . Depression   . GERD (gastroesophageal reflux disease)   . Globus sensation   . Hyperlipidemia   . Insomnia   . Multinodular goiter (nontoxic)   . Psoriatic arthritis ([place])   . Spinal stenosis of lumbar region June 2016   MRI   . Statin intolerance 01/27/2013    Family History  Problem Relation Age of Onset  . Heart disease Mother   . Hyperlipidemia Mother   . Coronary artery disease Father 12       CABG in early 80's  . Hyperlipidemia Father   . Epilepsy Grandchild        Severe form  . Colon cancer Neg Hx   . Esophageal cancer Neg Hx   . Rectal cancer Neg Hx   . Stomach cancer Neg Hx     Social History   Socioeconomic History  . Marital status: Married    Spouse name: Not on file  . Number of children: 2  . Years of education: Not on file  . Highest education level: Not on file  Occupational History  . Occupation: disability  Tobacco Use  . Smoking status: Former Smoker    Types: Cigarettes    Quit date: 08/21/1971    Years since quitting: 48.9  . Smokeless tobacco: Never Used  Vaping Use  . Vaping Use: Never used  Substance and Sexual Activity  . Alcohol use: Yes    Alcohol/week: 7.0 standard drinks    Types: 7 Glasses of wine per week  . Drug use: No  . Sexual activity: Not on file  Other Topics Concern  . Not on file  Social History Narrative   Married, retired Therapist, sports   2 daughters some grandchildren   2 caffeinated drinks daily   1 alcoholic beverage daily   No tobacco   04/29/2015   Social Determinants of Health   Financial Resource Strain: Low Risk   . Difficulty of Paying Living Expenses: Not hard at all  Food Insecurity: No Food Insecurity  .  Worried About Charity fundraiser in the Last Year: Never true  . Ran Out of Food in the Last  Year: Never true  Transportation Needs: No Transportation Needs  . Lack of Transportation (Medical): No  . Lack of Transportation (Non-Medical): No  Physical Activity:   . Days of Exercise per Week: Not on file  . Minutes of Exercise per Session: Not on file  Stress: No Stress Concern Present  . Feeling of Stress : Not at all  Social Connections: Unknown  . Frequency of Communication with Friends and Family: Not on file  . Frequency of Social Gatherings with Friends and Family: Not on file  . Attends Religious Services: Not on file  . Active Member of Clubs or Organizations: Not on file  . Attends Archivist Meetings: Not on file  . Marital Status: Married  Human resources officer Violence:   . Fear of Current or Ex-Partner: Not on file  . Emotionally Abused: Not on file  . Physically Abused: Not on file  . Sexually Abused: Not on file    Past Medical History, Surgical history, Social history, and Family history were reviewed and updated as appropriate.   Please see review of systems for further details on the patient's review from today.   Objective:   Physical Exam:  There were no vitals taken for this visit.  Physical Exam Constitutional:      General: She is not in acute distress.    Appearance: Normal appearance. She is well-developed.  Musculoskeletal:        General: No deformity.  Neurological:     Mental Status: She is alert and oriented to person, place, and time.     Motor: No tremor.     Coordination: Coordination normal.     Gait: Gait normal.  Psychiatric:        Attention and Perception: Perception normal. She is inattentive.        Mood and Affect: Mood is not anxious or depressed. Affect is not labile, blunt, angry or inappropriate.        Speech: Speech normal.        Behavior: Behavior normal.        Thought Content: Thought content normal. Thought content does not include homicidal or suicidal ideation. Thought content does not include homicidal  or suicidal plan.        Cognition and Memory: Cognition normal.        Judgment: Judgment normal.     Comments: Insight intact. No auditory or visual hallucinations. No delusions.  Talkative per usual. More irritable.     Lab Review:     Component Value Date/Time   NA 138 02/06/2020 1106   K 3.8 02/06/2020 1106   CL 100 02/06/2020 1106   CO2 32 02/06/2020 1106   GLUCOSE 99 02/06/2020 1106   BUN 21 02/06/2020 1106   CREATININE 0.72 02/06/2020 1106   CALCIUM 9.5 02/06/2020 1106   PROT 7.0 02/06/2020 1106   ALBUMIN 4.4 02/06/2020 1106   AST 16 02/06/2020 1106   ALT 14 02/06/2020 1106   ALKPHOS 58 02/06/2020 1106   BILITOT 0.4 02/06/2020 1106   GFRNONAA >60 02/18/2016 0322   GFRAA >60 02/18/2016 0322       Component Value Date/Time   WBC 4.6 08/07/2019 1159   RBC 3.96 08/07/2019 1159   HGB 13.0 08/07/2019 1159   HCT 38.3 08/07/2019 1159   PLT 238.0 08/07/2019 1159   MCV 96.6 08/07/2019 1159  MCH 31.0 02/18/2016 0322   MCHC 33.9 08/07/2019 1159   RDW 13.0 08/07/2019 1159   LYMPHSABS 2.1 08/07/2019 1159   MONOABS 0.4 08/07/2019 1159   EOSABS 0.1 08/07/2019 1159   BASOSABS 0.0 08/07/2019 1159    No results found for: POCLITH, LITHIUM   No results found for: PHENYTOIN, PHENOBARB, VALPROATE, CBMZ   .res Assessment: Plan:    Madeline Young was seen today for follow-up, depression, anxiety, adhd and sleeping problem.  Diagnoses and all orders for this visit:  Major depression, recurrent, full remission ([place])  Insomnia due to mental condition  Attention deficit hyperactivity disorder (ADHD), predominantly inattentive type -     lisdexamfetamine (VYVANSE) 50 MG capsule; Take 1 capsule (50 mg total) by mouth daily. -     lisdexamfetamine (VYVANSE) 50 MG capsule; Take 1 capsule (50 mg total) by mouth daily. -     lisdexamfetamine (VYVANSE) 50 MG capsule; Take 1 capsule (50 mg total) by mouth daily.   Disc pros/cons of switching stimulants or adjusting dose.  Disc SE  and withdrawal and dosing options.  Would be best to control it.  Discussed potential benefits, risks, and side effects of stimulants with patient to include increased heart rate, palpitations, insomnia, increased anxiety, increased irritability, or decreased appetite.  Instructed patient to contact office if experiencing any significant tolerability issues.  Disc risk steroids affecting mood and sleep.  Supportive therapy dealing with stubborn H with memory problems.  Reduce Vyvanse to 50 mg AM continue fluoxetine to 20 mg daily DT her fear of serotonin syndrome.  Thinks she had some of those sx in the past.  May have reduced benefit DT lower dose.  Trial Belsomra 15-20 mg or Dayvigo for sleep instead of Ambien bc hangover with 5 mg daily.   Consider alternative treatments for ADD including off label.  This appt was 30 mins.   FU 3 mos.  Lynder Parents, MD, DFAPA   Please see After Visit Summary for patient specific instructions.  Future Appointments  Date Time Provider [place]  08/09/2020  1:30 PM Crecencio Mc, MD [id number]  07/19/2021 10:30 AM O'Brien-Blaney, Denisa L, LPN [id number]    No orders of the defined types were placed in this encounter.     -------------------------------

## 2020-08-09 ENCOUNTER — Encounter: Payer: Self-pay | Admitting: Internal Medicine

## 2020-08-09 ENCOUNTER — Other Ambulatory Visit: Payer: Self-pay

## 2020-08-09 ENCOUNTER — Ambulatory Visit (INDEPENDENT_AMBULATORY_CARE_PROVIDER_SITE_OTHER): Payer: Medicare HMO | Admitting: Internal Medicine

## 2020-08-09 VITALS — BP 124/80 | HR 76 | Temp 98.2°F | Ht 64.5 in | Wt 170.0 lb

## 2020-08-09 DIAGNOSIS — M4316 Spondylolisthesis, lumbar region: Secondary | ICD-10-CM | POA: Diagnosis not present

## 2020-08-09 DIAGNOSIS — R69 Illness, unspecified: Secondary | ICD-10-CM | POA: Diagnosis not present

## 2020-08-09 DIAGNOSIS — E042 Nontoxic multinodular goiter: Secondary | ICD-10-CM

## 2020-08-09 DIAGNOSIS — Z79899 Other long term (current) drug therapy: Secondary | ICD-10-CM

## 2020-08-09 DIAGNOSIS — Z124 Encounter for screening for malignant neoplasm of cervix: Secondary | ICD-10-CM | POA: Diagnosis not present

## 2020-08-09 DIAGNOSIS — F5101 Primary insomnia: Secondary | ICD-10-CM

## 2020-08-09 DIAGNOSIS — R151 Fecal smearing: Secondary | ICD-10-CM | POA: Diagnosis not present

## 2020-08-09 DIAGNOSIS — Z23 Encounter for immunization: Secondary | ICD-10-CM

## 2020-08-09 DIAGNOSIS — L4052 Psoriatic arthritis mutilans: Secondary | ICD-10-CM | POA: Diagnosis not present

## 2020-08-09 NOTE — Patient Instructions (Addendum)
Cathartic laxatives:   Magnesium MOM  Lactulose   Stimulant laxativess:  Senna  bisacodyl correctol Ex Lax  Bulk forming laxatives:  Citrucel,  Benefier,  Metamucil or miralax: builk forming laxatives   Colace  (docusate)  Stool softener   START WITH BULK FORMING LAXATIVE ; USE IT  DAILY   CALL  GI  TO FOLLOW THROUGH WITH REFERRAL   SEND ME DR VAUGHT'S PRESCRIPTION FOR EAR

## 2020-08-09 NOTE — Progress Notes (Signed)
Subjective:  Patient ID: Madeline Young, female    DOB: 11-03-1955  Age: 64 y.o. MRN: [id number]  CC: The primary encounter diagnosis was Needs flu shot. 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  HPI [name] presents for follow up on chronic joint pain managed with oxycodone and celebrex, and other issues   This visit occurred during the SARS-CoV-2 public health emergency.  Safety protocols were in place, including screening questions prior to the visit, additional usage of staff PPE, and extensive cleaning of exam room while observing appropriate contact time as indicated for disinfecting solutions.    Patient has received both doses of the available COVID 19 vaccine without complications.  Patient continues to mask when outside of the home except when walking in yard or at safe distances from others .  Patient denies any change in mood or development of unhealthy behaviors resuting from the pandemic's restriction of activities and socialization.     Psoriatic arthritis with chronic pain despite use of Enbrel. For the last several months she reports recurrent infections,  Pain not controlled, so she stopped the Enbrel Sept 1 . Hand stiffness and pain has been  getting harder to manage. Rheum wants to start her on a new drug but her insurance  Will not pay for it.  Has not let them know this.  She is seeing Dr Dossie Der with [place] . Wants a referral to someone in [place] who was recommended but can't remember who.    New onset skin rash:  After one month of stopping Enbrel she has started having a diffuse papular skin rash ,  nonpruritic  But unresolving and affecting the face, arms and trunk.  She Saw dermatology , Dr  Evorn Gong did a biopsy , "not psoriasis",  confirmed with biopsy .  Frustrated , thinks biopsy is wrong.  Has a dog, no fleas or ticks on dogs.    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 Now resolved.   "Major bowel changes"since last visit.  Has had constipation for the past week, described as a change from daily BMS to one BM  every 3 days , feeling bloated .For the past year has been having incomplete evacuation of bowels,  Resulting in fecal smearing which has been occurring about  an hour after her BM resulting in perianal irritation and bleeding.    Was referred to GI but was told she was not due until 2023 for colonoscopy.  Has not called them back to discuss new symptoms.   Insomnia: stopped ambien due to concerns of its effect on memory and hangover effect.   Reduced it gradually to OFF. Psych gave her trazodone , had bad dreams and felt like a zombie the following day Getting used to it   Given but has not tried samples of Belsomra worried about its effect on her memory . Has ADD and all meds managed by Dr Clovis Pu.     Outpatient Medications Prior to Visit  Medication Sig Dispense Refill  . aspirin 81 MG tablet Take 81 mg by mouth daily.      . calcium-vitamin D (OSCAL WITH D) 250-125 MG-UNIT tablet Take 1 tablet by mouth daily.    . celecoxib (CELEBREX) 200 MG capsule Take 1 capsule by mouth once daily 90 capsule 0  .  cholecalciferol (VITAMIN D) 1000 UNITS tablet Take 1,000 Units by mouth daily.      . clobetasol (TEMOVATE) 0.05 % external solution APPLY TO AFFECTED AREAS ON SCALP TWICE DAILY UNTIL CLEAR    . diclofenac Sodium (VOLTAREN) 1 % GEL APPLY 2 GRAMS TO AFFECTED AREA 4 TIMES A DAY 300 g 3  . Etanercept (ENBREL) 25 MG/0.5ML SOLN Inject into the skin.     . fluocinonide ointment (LIDEX) 0.05 % APPLY TWICE DAILY TO AFFECTED AREAS UNTIL IMPROVED THEN AS NEEDED FOR FLARES    . FLUoxetine (PROZAC) 20 MG capsule Take 1 capsule (20 mg total) by mouth daily. 90 capsule 1  . Lactobacillus  (PROBIOTIC ACIDOPHILUS PO) Take 1 capsule by mouth daily.    . Lactobacillus-Inulin (CULTURELLE DIGESTIVE HEALTH PO) Take 1 tablet by mouth daily.     Derrill Memo ON 09/27/2020] lisdexamfetamine (VYVANSE) 50 MG capsule Take 1 capsule (50 mg total) by mouth daily. (Patient taking differently: Take 60 mg by mouth daily. ) 30 capsule 0  . Multiple Vitamin (MULTIVITAMIN) tablet Take 1 tablet by mouth daily.    . Omega-3 Fatty Acids (FISH OIL) 1200 MG CAPS Take 1,200 mg by mouth daily.     Marland Kitchen oxyCODONE-acetaminophen (PERCOCET/ROXICET) 5-325 MG tablet Take 1-2 tablets by mouth daily as needed for moderate pain. 60 tablet 0  . Polyethyl Glycol-Propyl Glycol (SYSTANE ULTRA) 0.4-0.3 % SOLN Place 1 drop into both eyes daily as needed (for dry eyes).    . traZODone (DESYREL) 50 MG tablet TAKE 1-2 TABLETS BY MOUTH AT BEDTIME. 180 tablet 0  . triamcinolone cream (KENALOG) 0.1 % APPLY TO AFFECTED AREA TWICE A DAY UNTIL CLEAR THEN AS NEEDED    . oxyCODONE-acetaminophen (PERCOCET/ROXICET) 5-325 MG tablet Take 1-2 tablets by mouth daily as needed for moderate pain. 60 tablet 0  . oxyCODONE-acetaminophen (PERCOCET/ROXICET) 5-325 MG tablet Take 1-2 tablets by mouth daily as needed for moderate pain. 60 tablet 0  . chlorpheniramine-HYDROcodone (TUSSIONEX PENNKINETIC ER) 10-8 MG/5ML SUER Take 5 mLs by mouth at bedtime as needed. 140 mL 0  . [START ON 08/30/2020] lisdexamfetamine (VYVANSE) 50 MG capsule Take 1 capsule (50 mg total) by mouth daily. 30 capsule 0  . lisdexamfetamine (VYVANSE) 50 MG capsule Take 1 capsule (50 mg total) by mouth daily. 30 capsule 0  . zolpidem (AMBIEN) 10 MG tablet Take 0.5-1 tablets (5-10 mg total) by mouth at bedtime. 30 tablet 5   No facility-administered medications prior to visit.    Review of Systems;  Patient denies headache, fevers, malaise, unintentional weight loss, skin rash, eye pain, sinus congestion and sinus pain, sore throat, dysphagia,  hemoptysis , cough, dyspnea, wheezing,  chest pain, palpitations, orthopnea, edema, abdominal pain, nausea, melena, diarrhea, constipation, flank pain, dysuria, hematuria, urinary  Frequency, nocturia, numbness, tingling, seizures,  Focal weakness, Loss of consciousness,  Tremor, insomnia, depression, anxiety, and suicidal ideation.      Objective:  BP 124/80   Pulse 76   Temp 98.2 F (36.8 C) (Oral)   Ht 5' 4.5" (1.638 m)   Wt 170 lb (77.1 kg)   SpO2 99%   BMI 28.73 kg/m   BP Readings from Last 3 Encounters:  08/09/20 124/80  02/06/20 128/62  08/07/19 128/66    Wt Readings from Last 3 Encounters:  08/09/20 170 lb (77.1 kg)  07/16/20 169 lb (76.7 kg)  05/10/20 169 lb (76.7 kg)    General appearance: alert, cooperative and appears stated age Ears: normal TM's and external ear canals both  ears Throat: lips, mucosa, and tongue normal; teeth and gums normal Neck: no adenopathy, no carotid bruit, supple, symmetrical, trachea midline and thyroid not enlarged, symmetric, no tenderness/mass/nodules Back: symmetric, no curvature. ROM normal. No CVA tenderness. Lungs: clear to auscultation bilaterally Heart: regular rate and rhythm, S1, S2 normal, no murmur, click, rub or gallop Abdomen: soft, non-tender; bowel sounds normal; no masses,  no organomegaly Pulses: 2+ and symmetric Skin: Skin color, texture, turgor normal. Papular rash on arms , face noted  Joints:  Bilateral disfiguration of PIPs,  MCPs due to bony overgrowth. Lymph nodes: Cervical, supraclavicular, and axillary nodes normal.  No results found for: HGBA1C  Lab Results  Component Value Date   CREATININE 0.72 02/06/2020   CREATININE 0.65 08/07/2019   CREATININE 0.65 01/13/2019    Lab Results  Component Value Date   WBC 4.6 08/07/2019   HGB 13.0 08/07/2019   HCT 38.3 08/07/2019   PLT 238.0 08/07/2019   GLUCOSE 99 02/06/2020   CHOL 397 (H) 01/13/2019   TRIG 97.0 01/13/2019   HDL 56.10 01/13/2019   LDLDIRECT 252.3 06/12/2012   LDLCALC 322 (H)  01/13/2019   ALT 14 02/06/2020   AST 16 02/06/2020   NA 138 02/06/2020   K 3.8 02/06/2020   CL 100 02/06/2020   CREATININE 0.72 02/06/2020   BUN 21 02/06/2020   CO2 32 02/06/2020   TSH 1.02 01/13/2019    MM 3D SCREEN BREAST BILATERAL  Result Date: 05/28/2020 CLINICAL DATA:  Screening. EXAM: DIGITAL SCREENING BILATERAL MAMMOGRAM WITH TOMO AND CAD COMPARISON:  Previous exam(s). ACR Breast Density Category b: There are scattered areas of fibroglandular density. FINDINGS: There are no findings suspicious for malignancy. Images were processed with CAD. IMPRESSION: No mammographic evidence of malignancy. A result letter of this screening mammogram will be mailed directly to the patient. RECOMMENDATION: Screening mammogram in one year. (Code:SM-B-01Y) BI-RADS CATEGORY  1: Negative. Electronically Signed   By: Lillia Mountain M.D.   On: 05/28/2020 14:58    Assessment & Plan:   Problem List Items Addressed This Visit      Unprioritized   Fecal smearing    Unclear if symptoms are due to loss of sphincter control due to lumbar spondylolisthesis or due to obstructing mass in rectum.  Referral to GI for evaluation needed,        Relevant Orders   Ambulatory referral to Gastroenterology   Insomnia    Discussed S/w profile and l/t risks associated with ALL hypnotics .  encouraged to give trazodone more time      Multinodular goiter (nontoxic)    Last thyroid function assessment was one year ago .  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  New referral pending patient callback       Relevant Medications   oxyCODONE-acetaminophen (PERCOCET/ROXICET) 5-325 MG tablet (Start on 09/06/2020)   oxyCODONE-acetaminophen (PERCOCET/ROXICET) 5-325 MG tablet (Start on 10/06/2020)   oxyCODONE-acetaminophen (PERCOCET/ROXICET) 5-325 MG tablet (Start on 11/05/2020)   Screening for cervical cancer    Normal.  HPV negative 2017.        Spondylolisthesis of lumbar region    With persistent low back pain and right sided sciatica despite prior surgery.  Her pain is not relieved with NSAIDs or recent ESI. .   Management of her pain requires  refill on narcotics for twice daily  use . Marland Kitchen Refill history confirmed via [place] Controlled Substance database, accessed  by me today..she has had no narcotics sent in by any other  Provider. .refills for November 29 December 29 and November 05 2020 have  been authorized and sent electronically         Other Visit Diagnoses    Needs flu shot    -  Primary   Relevant Orders   Flu Vaccine QUAD High Dose(Fluad) (Completed)   Long-term use of high-risk medication          I have discontinued Samanthamarie D. Tobia "[name]"'s zolpidem and chlorpheniramine-HYDROcodone. I am also having her maintain her aspirin, cholecalciferol, Fish Oil, Lactobacillus-Inulin (CULTURELLE DIGESTIVE HEALTH PO), Polyethyl Glycol-Propyl Glycol, calcium-vitamin D, multivitamin, fluocinonide ointment, triamcinolone cream, clobetasol, Enbrel, traZODone, diclofenac Sodium, Lactobacillus (PROBIOTIC ACIDOPHILUS PO), FLUoxetine, oxyCODONE-acetaminophen, celecoxib, lisdexamfetamine, oxyCODONE-acetaminophen, oxyCODONE-acetaminophen, and oxyCODONE-acetaminophen.  Meds ordered this encounter  Medications  . oxyCODONE-acetaminophen (PERCOCET/ROXICET) 5-325 MG tablet    Sig: Take 1-2 tablets by mouth daily as needed for moderate pain.    Dispense:  60 tablet    Refill:  0  . oxyCODONE-acetaminophen (PERCOCET/ROXICET) 5-325 MG tablet    Sig: Take 1-2 tablets by mouth daily as needed for moderate pain.    Dispense:  60 tablet    Refill:  0  . oxyCODONE-acetaminophen (PERCOCET/ROXICET) 5-325 MG tablet    Sig: Take 1-2 tablets by mouth daily as needed for moderate pain.    Dispense:  60 tablet    Refill:  0  A total of 40 minutes was spent with patient more than half of which was spent in counseling patient on the above mentioned issues ,  reviewing and explaining recent labs and imaging studies done, and coordination of care.   Medications Discontinued During This Encounter  Medication Reason  . chlorpheniramine-HYDROcodone (TUSSIONEX PENNKINETIC ER) 10-8 MG/5ML SUER Patient Preference  . lisdexamfetamine (VYVANSE) 50 MG capsule Patient Preference  . lisdexamfetamine (VYVANSE) 50 MG capsule Patient Preference  . zolpidem (AMBIEN) 10 MG tablet Patient Preference  . oxyCODONE-acetaminophen (PERCOCET/ROXICET) 5-325 MG tablet Reorder  . oxyCODONE-acetaminophen (PERCOCET/ROXICET) 5-325 MG tablet Reorder    Follow-up: Return in about 3 months (around 11/09/2020).   Crecencio Mc, MD

## 2020-08-09 NOTE — Assessment & Plan Note (Signed)
Normal.  HPV negative 2017.

## 2020-08-10 ENCOUNTER — Encounter: Payer: Self-pay | Admitting: Internal Medicine

## 2020-08-10 DIAGNOSIS — R151 Fecal smearing: Secondary | ICD-10-CM | POA: Insufficient documentation

## 2020-08-10 LAB — COMPREHENSIVE METABOLIC PANEL
ALT: 12 U/L (ref 0–35)
AST: 16 U/L (ref 0–37)
Albumin: 3.9 g/dL (ref 3.5–5.2)
Alkaline Phosphatase: 50 U/L (ref 39–117)
BUN: 19 mg/dL (ref 6–23)
CO2: 31 mEq/L (ref 19–32)
Calcium: 8.5 mg/dL (ref 8.4–10.5)
Chloride: 101 mEq/L (ref 96–112)
Creatinine, Ser: 0.68 mg/dL (ref 0.40–1.20)
GFR: 91.03 mL/min (ref >60.00)
Glucose, Bld: 79 mg/dL (ref 70–99)
Potassium: 4.1 mEq/L (ref 3.5–5.1)
Sodium: 138 mEq/L (ref 135–145)
Total Bilirubin: 0.5 mg/dL (ref 0.2–1.2)
Total Protein: 6.2 g/dL (ref 6.0–8.3)

## 2020-08-10 LAB — TSH: TSH: 2.89 u[IU]/mL (ref 0.35–4.50)

## 2020-08-10 MED ORDER — OXYCODONE-ACETAMINOPHEN 5-325 MG PO TABS
1.0000 | ORAL_TABLET | Freq: Every day | ORAL | 0 refills | Status: DC | PRN
Start: 2020-11-05 — End: 2020-11-12

## 2020-08-10 MED ORDER — OXYCODONE-ACETAMINOPHEN 5-325 MG PO TABS
1.0000 | ORAL_TABLET | Freq: Every day | ORAL | 0 refills | Status: DC | PRN
Start: 2020-09-06 — End: 2020-09-08

## 2020-08-10 MED ORDER — OXYCODONE-ACETAMINOPHEN 5-325 MG PO TABS: 1.0000 | ORAL_TABLET | Freq: Every day | ORAL | 0 refills | Status: DC | PRN

## 2020-08-10 NOTE — Progress Notes (Signed)
Your  thyroid , liver and kidney function are normal.   Please Plan to repeat fasting labs in 6 months.   Regards,  Dr. Derrel Nip

## 2020-08-10 NOTE — Assessment & Plan Note (Signed)
Encouraged to resume Enbrel .  New referral pending patient callback

## 2020-08-10 NOTE — Assessment & Plan Note (Signed)
Last thyroid function assessment was one year ago .  Repeat testing ordered

## 2020-08-10 NOTE — Assessment & Plan Note (Signed)
With persistent low back pain and right sided sciatica despite prior surgery.  Her pain is not relieved with NSAIDs or recent ESI. .   Management of her pain requires  refill on narcotics for twice daily  use . Marland Kitchen Refill history confirmed via [place] Controlled Substance database, accessed by me today..she has had no narcotics sent in by any other  Provider. .refills for November 29 December 29 and November 05 2020 have  been authorized and sent electronically

## 2020-08-10 NOTE — Assessment & Plan Note (Signed)
Discussed S/w profile and l/t risks associated with ALL hypnotics .  encouraged to give trazodone more time

## 2020-08-10 NOTE — Assessment & Plan Note (Signed)
Unclear if symptoms are due to loss of sphincter control due to lumbar spondylolisthesis or due to obstructing mass in rectum.  Referral to GI for evaluation needed,

## 2020-08-13 DIAGNOSIS — M791 Myalgia, unspecified site: Secondary | ICD-10-CM | POA: Diagnosis not present

## 2020-08-13 DIAGNOSIS — L4050 Arthropathic psoriasis, unspecified: Secondary | ICD-10-CM | POA: Diagnosis not present

## 2020-08-13 DIAGNOSIS — M150 Primary generalized (osteo)arthritis: Secondary | ICD-10-CM | POA: Diagnosis not present

## 2020-08-13 DIAGNOSIS — Z79899 Other long term (current) drug therapy: Secondary | ICD-10-CM | POA: Diagnosis not present

## 2020-08-13 DIAGNOSIS — M797 Fibromyalgia: Secondary | ICD-10-CM | POA: Diagnosis not present

## 2020-08-13 DIAGNOSIS — M5136 Other intervertebral disc degeneration, lumbar region: Secondary | ICD-10-CM | POA: Diagnosis not present

## 2020-08-13 DIAGNOSIS — R21 Rash and other nonspecific skin eruption: Secondary | ICD-10-CM | POA: Diagnosis not present

## 2020-08-16 ENCOUNTER — Telehealth: Payer: Self-pay | Admitting: Psychiatry

## 2020-08-16 NOTE — Telephone Encounter (Signed)
Not sure Lunesta samples are available?

## 2020-08-16 NOTE — Telephone Encounter (Signed)
Error

## 2020-08-16 NOTE — Telephone Encounter (Signed)
Madeline Young would not be a reasonable option bc it lasts longer than Ambien from which she gets a hangover.  It was suggested she try Belsomra 15-20 mg daily.  She should have received those samples if not she can pick them up.

## 2020-08-16 NOTE — Telephone Encounter (Signed)
Patient got samples of a few medications when she was seen last but didn't get any samples of Lunesta. Wonders if she can get some samples of this? She will pick up. [phone].

## 2020-08-18 ENCOUNTER — Other Ambulatory Visit: Payer: Self-pay | Admitting: Psychiatry

## 2020-08-18 MED ORDER — ESZOPICLONE 2 MG PO TABS: 2.0000 mg | ORAL_TABLET | Freq: Every evening | ORAL | 0 refills | Status: DC | PRN

## 2020-08-18 NOTE — Telephone Encounter (Signed)
Okay I will let her try Johnnye Sima but it is at least as likely to cause hangover as the Ambien and Belsomra.  But if she is willing to take that chance to send in prescription

## 2020-08-18 NOTE — Telephone Encounter (Signed)
Rtc to patient and she did try the Belsomra and reports having the "hang over and drowsy" effect. Same as the trazodone.  She is asking to try Lunesta instead.

## 2020-08-18 NOTE — Telephone Encounter (Signed)
Rtc to patient and let her know Rx sent, she received notification that it's not covered by her insurance. Informed her I would try to submit a prior authorization but it would day a couple days. Looked price up on [facility] and it's not expensive except at [facility], which she is using right now. Will contact her for that option to change pharmacies.

## 2020-08-19 DIAGNOSIS — R69 Illness, unspecified: Secondary | ICD-10-CM | POA: Diagnosis not present

## 2020-08-22 ENCOUNTER — Telehealth: Payer: Self-pay

## 2020-08-22 NOTE — Telephone Encounter (Signed)
Prior authorization submitted and approved for ESZOPICLONE Lunesta 2 mg through [facility] part D ID# [facility] approved effective October 10, 2019 through October 08, 2020

## 2020-08-26 ENCOUNTER — Other Ambulatory Visit: Payer: Self-pay | Admitting: Psychiatry

## 2020-09-06 DIAGNOSIS — R69 Illness, unspecified: Secondary | ICD-10-CM | POA: Diagnosis not present

## 2020-09-08 ENCOUNTER — Ambulatory Visit: Payer: Medicare HMO | Admitting: Internal Medicine

## 2020-09-08 VITALS — BP 142/60 | HR 79 | Ht 64.5 in | Wt 169.0 lb

## 2020-09-08 DIAGNOSIS — L309 Dermatitis, unspecified: Secondary | ICD-10-CM

## 2020-09-08 DIAGNOSIS — R159 Full incontinence of feces: Secondary | ICD-10-CM

## 2020-09-08 DIAGNOSIS — R32 Unspecified urinary incontinence: Secondary | ICD-10-CM

## 2020-09-08 MED ORDER — NYSTATIN-TRIAMCINOLONE 100000-0.1 UNIT/GM-% EX OINT: 1.0000 "application " | TOPICAL_OINTMENT | Freq: Two times a day (BID) | CUTANEOUS | 1 refills | Status: DC

## 2020-09-08 NOTE — Patient Instructions (Signed)
We have sent the following medications to your pharmacy for you to pick up at your convenience: Mycolog, your insurance may not cover this. Try the [place] card  Dr Carlean Purl wants you to go see your Dermatologist about your rashes.  We have put a referral in the system for [facility] for pelvic floor physical therapy. They will contact you about an appointment. Their #  [phone].  Please use a hair dryer to dry your bottom after bowel movements if possible.   I appreciate the opportunity to care for you. Silvano Rusk, MD, [facility]

## 2020-09-08 NOTE — Progress Notes (Signed)
Madeline Young 64 y.o. 04/16/1956 [id number]  Assessment & Plan:   Encounter Diagnoses  Name Primary?   Urinary and fecal incontinence Yes   Perianal dermatitis    I explained to her I think pelvic floor dysfunction or weakness could have some causative role in her problems with incontinence.  She was in some disbelief about this overall but is willing to try pelvic floor physical therapy.  We will make a referral to [facility] physical therapy in [place].  I encouraged her to try this as I think she is likely to get some improvement.  I do not think change in consistency of bowels etc. is needed.  She has normal formed bowel movements.  I suppose it is possible some of this is related to her spinal surgery but that is unlikely to change our approach.  Regarding this vulvar rash that she has and other rashes I do think she should go back to dermatology given her history of psoriatic arthritis.  She does seem to have a fairly straightforward perianal dermatitis probably from fecal incontinence and perhaps aggressive wiping.  I will treat that with generic Mycolog and have asked her to avoid over wiping and trauma to the area and she can use a hair dryer on low to dry the area after showering etc.  She can follow-up with me after the above is done, and if unsuccessful.  I think a probiotic is optional for her.  I do not think it is particularly therapeutic and we really have no good data to support the use of probiotics in most circumstances.  I do not think she needs a colonoscopy for the signs and symptoms.  She is somewhat fearful because her husband had colon cancer but her next colonoscopy is not really appropriate until 2023 7 years after diminutive adenoma was removed.  Her constipation resolved when she stopped trazodone. 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  The constipation seem to start because of trazodone use trying to come off Ambien and use something different.  Once she stopped the trazodone the constipation resolved.  Normal formed stools at this point.  No diarrhea or mushy or loose stools.  However she has had fecal smearing for some time now actually about 4 years after having spinal stenosis surgery.  She will notice stool in the perianal area when she wipes after urinating but does not have to defecate.  she said her surgeon told her there was "no way it was related to the surgery".  She has sore skin in the perianal area and irritation and I think some itching there as well.  She also has some stress urinary incontinence issues not bad she thinks but will have these issues at times.  She has had a question of whether she has "inverse psoriasis" with a rash in her vaginal and vulvar area.  She has psoriatic arthritis and had come off Enbrel and has been having some problems there though that is improved.  She has not followed up with her dietitian about this.  Last colonoscopy 2016 with a diminutive adenoma recall planned for 2023.  That colonoscopy was the last visit with me or my practice.   Primary care visit as below 08/09/20 Madeline Mc, MD  "Major bowel changes"since last visit.  Has had constipation for the past week, described as a change from daily BMS to one BM  every 3 days , feeling bloated .For the past year has been having incomplete evacuation of bowels,  Resulting in fecal smearing which has been occurring about  an hour after her BM resulting in perianal irritation and bleeding.    Was referred to GI but was told she was not due until 2023 for colonoscopy.  Has not called them back to discuss new symptoms.      Last colonoscopy 2016, 3 mm adenoma, single small adenoma  2009, has left-sided diverticulosis   TSH normal November  1 at 2.89 CMET was normal that day as well. Allergies  Allergen Reactions   Cosentyx [Secukinumab] Rash    Severe case of psorasis   Sertraline Hcl Other (See Comments)    Made me crazy    Avelox [Moxifloxacin Hcl In Nacl]     Pt cant remember reaction   Latex Itching and Rash    Redness   Macrobid [Nitrofurantoin Monohyd Macro] Other (See Comments)    tired   Statins Other (See Comments)    Muscle aches   Current Meds  Medication Sig   aspirin 81 MG tablet Take 81 mg by mouth daily.     calcium-vitamin D (OSCAL WITH D) 250-125 MG-UNIT tablet Take 1 tablet by mouth daily.   celecoxib (CELEBREX) 200 MG capsule Take 1 capsule by mouth once daily   cholecalciferol (VITAMIN D) 1000 UNITS tablet Take 1,000 Units by mouth daily.     clobetasol (TEMOVATE) 0.05 % external solution APPLY TO AFFECTED AREAS ON SCALP TWICE DAILY UNTIL CLEAR   diclofenac Sodium (VOLTAREN) 1 % GEL APPLY 2 GRAMS TO AFFECTED AREA 4 TIMES A DAY   fluocinonide ointment (LIDEX) 0.05 % APPLY TWICE DAILY TO AFFECTED AREAS UNTIL IMPROVED THEN AS NEEDED FOR FLARES   FLUoxetine (PROZAC) 20 MG capsule TAKE 1 CAPSULE BY MOUTH EVERY DAY   Lactobacillus (PROBIOTIC ACIDOPHILUS PO) Take 1 capsule by mouth daily.   [START ON 09/27/2020] lisdexamfetamine (VYVANSE) 50 MG capsule Take 1 capsule (50 mg total) by mouth daily. (Patient taking differently: Take 60 mg by mouth daily. )   Multiple Vitamin (MULTIVITAMIN) tablet Take 1 tablet by mouth daily.   Omega-3 Fatty Acids (FISH OIL) 1200 MG CAPS Take 1,200 mg by mouth daily.    [START ON 10/06/2020] oxyCODONE-acetaminophen (PERCOCET/ROXICET) 5-325 MG tablet Take 1-2 tablets by mouth daily as needed for moderate pain.   [START ON 11/05/2020] oxyCODONE-acetaminophen (PERCOCET/ROXICET) 5-325 MG tablet Take 1-2 tablets by mouth daily as needed for moderate pain.   Polyethyl Glycol-Propyl Glycol (SYSTANE ULTRA) 0.4-0.3 % SOLN Place 1 drop into both eyes daily as needed  (for dry eyes).   triamcinolone cream (KENALOG) 0.1 % APPLY TO AFFECTED AREA TWICE A DAY UNTIL CLEAR THEN AS NEEDED   Past Medical History:  Diagnosis Date   Adenoma 10/06/2008   sigmoid 41mm   ADHD (attention deficit hyperactivity disorder)    Allergy    Anxiety    Arthritis    Cataract    bil cateracts removed   Complication of anesthesia    first colonoscopy pt states she woke up   Depression    GERD (gastroesophageal reflux disease)    Globus sensation    Hyperlipidemia    Insomnia    Multinodular goiter (nontoxic)    Psoriatic arthritis ([place])    Spinal stenosis of lumbar region June 2016   MRI  Statin intolerance 01/27/2013   Past Surgical History:  Procedure Laterality Date   BACK SURGERY  02/17/2016   Dr. Jacqulynn Cadet- L4-L5 fusion    BIOPSY THYROID  2014   COLONOSCOPY W/ POLYPECTOMY     EYE SURGERY     bilateral cataract surgery w/ lens implant   FOOT SURGERY  2006   Right foot , secondary to severe loss of joint ([place])   TONSILLECTOMY     UPPER GASTROINTESTINAL ENDOSCOPY  2005   With empiric esophageal dilation   Social History   Social History Narrative   Married, retired Therapist, sports   2 daughters some grandchildren   1 caffeinated drinks daily   1 alcoholic beverage daily   No tobacco      family history includes Coronary artery disease (age of onset: 48) in her father; Epilepsy in her grandchild; Heart disease in her mother; Hyperlipidemia in her father and mother.   Review of Systems As per HPI also with some rhinitis, decreased hearing in the right ear, chronic insomnia depressed mood, dry eye problems anxious mood chronic back pain arthritis  Objective:   Physical Exam BP (!) 142/60    Pulse 79    Ht 5' 4.5" (1.638 m)    Wt 169 lb (76.7 kg)    BMI 28.56 kg/m  Well-developed well-nourished white woman in no acute distress   Patti Martinique, CMA present.  Perianal dermatitis - see images     Anoderm inspection revealed see  photo no other abnormalities  Digital exam revealed slightly decreased resting tone and voluntary squeeze. No mass or rectocele present. Simulated defecation with valsalva revealed appropriate abdominal contraction and descent.   Anoscopy was performed and demonstrated grade 1 internal hemorrhoids and external hemorrhoids without inflammation in all positions

## 2020-09-09 ENCOUNTER — Encounter: Payer: Self-pay | Admitting: Internal Medicine

## 2020-10-07 ENCOUNTER — Other Ambulatory Visit: Payer: Self-pay | Admitting: Psychiatry

## 2020-10-07 ENCOUNTER — Telehealth: Payer: Self-pay | Admitting: Psychiatry

## 2020-10-07 MED ORDER — ZOLPIDEM TARTRATE 5 MG PO TABS: 5.0000 mg | ORAL_TABLET | Freq: Every evening | ORAL | 0 refills | Status: DC | PRN

## 2020-10-07 NOTE — Telephone Encounter (Signed)
Madeline Young called to request refill of Ambien 5mg .  She has had a very hard time trying to find a medication that well help her sleep.  She never tried the last one you prescribed because it was $400.  She has found that if she takes the low dose of ambien with some melatonin it helps. [facility]

## 2020-10-12 ENCOUNTER — Other Ambulatory Visit: Payer: Self-pay | Admitting: Internal Medicine

## 2020-11-01 ENCOUNTER — Ambulatory Visit (INDEPENDENT_AMBULATORY_CARE_PROVIDER_SITE_OTHER): Payer: Medicare HMO | Admitting: Psychiatry

## 2020-11-01 ENCOUNTER — Other Ambulatory Visit: Payer: Self-pay

## 2020-11-01 ENCOUNTER — Encounter: Payer: Self-pay | Admitting: Psychiatry

## 2020-11-01 DIAGNOSIS — F3342 Major depressive disorder, recurrent, in full remission: Secondary | ICD-10-CM

## 2020-11-01 DIAGNOSIS — F900 Attention-deficit hyperactivity disorder, predominantly inattentive type: Secondary | ICD-10-CM | POA: Diagnosis not present

## 2020-11-01 DIAGNOSIS — F5105 Insomnia due to other mental disorder: Secondary | ICD-10-CM | POA: Diagnosis not present

## 2020-11-01 DIAGNOSIS — R69 Illness, unspecified: Secondary | ICD-10-CM | POA: Diagnosis not present

## 2020-11-01 MED ORDER — FLUOXETINE HCL 20 MG PO CAPS
20.0000 mg | ORAL_CAPSULE | Freq: Every day | ORAL | 0 refills | Status: DC
Start: 2020-11-01 — End: 2021-03-08

## 2020-11-01 MED ORDER — LISDEXAMFETAMINE DIMESYLATE 60 MG PO CAPS: 60.0000 mg | ORAL_CAPSULE | Freq: Every day | ORAL | 0 refills | Status: DC

## 2020-11-01 MED ORDER — ZOLPIDEM TARTRATE 5 MG PO TABS
5.0000 mg | ORAL_TABLET | Freq: Every evening | ORAL | 2 refills | Status: DC | PRN
Start: 2020-11-01 — End: 2021-02-09

## 2020-11-01 NOTE — Progress Notes (Signed)
Madeline Young [id number] 1956-06-11 65 y.o. Virtual Visit via [employer]  I connected with pt by WebEx and verified that I am speaking with the correct person using two identifiers.   I discussed the limitations, risks, security and privacy concerns of performing an evaluation and management service by Madeline Young and the availability of in person appointments. I also discussed with the patient that there may be a patient responsible charge related to this service. The patient expressed understanding and agreed to proceed.  I discussed the assessment and treatment plan with the patient. The patient was provided an opportunity to ask questions and all were answered. The patient agreed with the plan and demonstrated an understanding of the instructions.   The patient was advised to call back or seek an in-person evaluation if the symptoms worsen or if the condition fails to improve as anticipated.  I provided 30 minutes of video time during this encounter. The call started at 100 and ended at 1:30. The patient was located at home and the provider was located office.   Subjective:   Patient ID:  Madeline Young is a 65 y.o. (DOB 15-Oct-1955) female.  Chief Complaint:  Chief Complaint  Patient presents with  . Follow-up  . Sleeping Problem  . ADHD    Depression        Associated symptoms include decreased concentration.  Associated symptoms include no suicidal ideas.  Madeline Young presents to the office today for follow-up of ADD and anxiety and depression and sleep.  seen August 2020.  No meds were changed.  On Vyvanse 50, fluoxetine 20, and Ambien 5.  Last seen November 30, 2019.  The following was noted: "Down".  Doesn't feel Vyvanse helping as much.  More scattered and doesn't finish things.  No SE with it. Sleep pattern is worse as Covid progressed.  Had reduced Zolpidem to 5 mg but then increased to 7.5 mg HS.  If takes Benadryl but can sleep but then has crying spells  after it and hangover.  In the AM feels more negative and tearful.  AM is hard bc stiff in the morning. Plan:Continue Vyvanse 50 mg AM Increase fluoxetine to 30 mg daily.  She doesn't want to go to 40 bc fear of serotonin syndrome.  Thinks she had some of those sx in the past Continue zolpidem 5 mg daily.  01/29/20 appt, reported:  Better with increased fluoxetine re: mood. No SE with meds. Still ADD problems not as well controlled as she'd like. Still problems with sleep and doesn't think ambien workiing as well as it should.  Tried trazodone with Ambien but had hangover and difficulty waking up and not a deep good sleep.  Never tried trazodone alone.  Increased Ambien on her own to 10 mg and ran out early.  Reduced to 5 mg and added Benadryl.  Erratic sleep since Covid.  Go to bed same time 10.  Avoids alcohol 2 hours before. Primary problem is going to sleep.  History of 10 mg Ambien for years.  Says 7.5 mg Ambien will work. Caffeine none after noon. Plan: Increase Vyvanse 60 mg AM continue fluoxetine to 30 mg daily.  She doesn't want to go to 40 bc fear of serotonin syndrome.  Thinks she had some of those sx in the past.  It helped to increase Increase zolpidem 5-10 mg daily.  08/02/2020 appointment with the following noted: Went back down to 20 mg fluoxetine bc of more HA and fear of serotonin  syndrome bc she feels she had it at some point in the past.  Taking 20 mg for mos. Still problems with sleep.  Down to 2.5 mg Ambien bc when takes more feels out of it the next day.   Thinks taking Ambien too long. Too much awakening. No marked benefit with increased Vyvanse to 60 mg and feels more irritable and edgy. However still distractible and inefficient and hard to finish things. Forgetful and loses things. Plan: Reduce Vyvanse to 50 mg AM continue fluoxetine to 20 mg daily DT her fear of serotonin syndrome.  Thinks she had some of those sx in the past.  May have reduced benefit DT lower  dose. Trial Belsomra 15-20 mg or Dayvigo for sleep instead of Ambien bc hangover with 5 mg daily.  11/01/2020 appointment with the following noted: Phone call 08/16/2020:Rtc to patient and she did try the Belsomra and reports having the "hang over and drowsy" effect. Same as the trazodone.  She is asking to try Lunesta instead.  10/07/2020 she called back again stating she is wanted to go back to Ambien 5 mg. Ambien seems to work again and getting 8 hours now. Re: reduction vyvanse to 50 mg.  Now feels that it's now working well.  Very distractible and not finishing things.  Not affordable to take the biologicals any more and had to stop but pain is not worse.  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  Helping to care for 39-month-old [place] in [place].  Past Psychiatric Medication Trials:  Zolpidem, trazodone hangover, Ambien, belsomra, Dayvigo to0 expensive, melatonin, Benadryl and combos (Lunesta never tried) Fluoxetine 60, sertraline NR, duloxetine, Lexapro, Wellbutrin, buspirone,  Ritalin LA, Concerta 54, Adderall 20 dizzy, Vyvanse 60  Under care at this office since July 2002   Review of Systems:  Review of Systems  Cardiovascular: Negative for palpitations.  Musculoskeletal: Positive for arthralgias and back pain.  Skin: Positive for rash.  Neurological: Negative for dizziness, tremors and weakness.  Psychiatric/Behavioral: Positive for decreased concentration, depression and sleep disturbance. Negative for agitation, behavioral problems, confusion, dysphoric mood, hallucinations, self-injury and suicidal ideas. The patient is not nervous/anxious and is not hyperactive.     Medications: I have reviewed the patient's current medications.  Current Outpatient Medications  Medication Sig Dispense Refill  . aspirin 81 MG tablet Take 81 mg by mouth daily.    . calcium-vitamin D (OSCAL WITH D) 250-125 MG-UNIT tablet Take 1 tablet by mouth daily.    .  celecoxib (CELEBREX) 200 MG capsule Take 1 capsule by mouth once daily 90 capsule 0  . cholecalciferol (VITAMIN D) 1000 UNITS tablet Take 1,000 Units by mouth daily.    . clobetasol (TEMOVATE) 0.05 % external solution APPLY TO AFFECTED AREAS ON SCALP TWICE DAILY UNTIL CLEAR    . diclofenac Sodium (VOLTAREN) 1 % GEL APPLY 2 GRAMS TO AFFECTED AREA 4 TIMES A DAY 300 g 3  . fluocinonide ointment (LIDEX) 0.05 % APPLY TWICE DAILY TO AFFECTED AREAS UNTIL IMPROVED THEN AS NEEDED FOR FLARES    . Lactobacillus (PROBIOTIC ACIDOPHILUS PO) Take 1 capsule by mouth daily.    . Multiple Vitamin (MULTIVITAMIN) tablet Take 1 tablet by mouth daily.    Marland Kitchen nystatin-triamcinolone ointment (MYCOLOG) Apply 1 application topically 2 (two) times daily. 30 g 1  . Omega-3 Fatty Acids (FISH OIL) 1200 MG CAPS Take 1,200 mg by mouth daily.     Marland Kitchen oxyCODONE-acetaminophen (PERCOCET/ROXICET) 5-325 MG tablet Take 1-2 tablets  by mouth daily as needed for moderate pain. 60 tablet 0  . [START ON 11/05/2020] oxyCODONE-acetaminophen (PERCOCET/ROXICET) 5-325 MG tablet Take 1-2 tablets by mouth daily as needed for moderate pain. 60 tablet 0  . Polyethyl Glycol-Propyl Glycol 0.4-0.3 % SOLN Place 1 drop into both eyes daily as needed (for dry eyes).    . triamcinolone cream (KENALOG) 0.1 % APPLY TO AFFECTED AREA TWICE A DAY UNTIL CLEAR THEN AS NEEDED    . FLUoxetine (PROZAC) 20 MG capsule Take 1 capsule (20 mg total) by mouth daily. 90 capsule 0  . lisdexamfetamine (VYVANSE) 60 MG capsule Take 1 capsule (60 mg total) by mouth daily. 30 capsule 0  . zolpidem (AMBIEN) 5 MG tablet Take 1 tablet (5 mg total) by mouth at bedtime as needed for sleep. On empty stomach 30 tablet 2   No current facility-administered medications for this visit.    Medication Side Effects: None  Allergies:  Allergies  Allergen Reactions  . Cosentyx [Secukinumab] Rash    Severe case of psorasis  . Sertraline Hcl Other (See Comments)    Made me crazy   . Avelox  [Moxifloxacin Hcl In Nacl]     Pt cant remember reaction  . Latex Itching and Rash    Redness  . Macrobid [employer] Macro] Other (See Comments)    tired  . Statins Other (See Comments)    Muscle aches    Past Medical History:  Diagnosis Date  . Adenoma 10/06/2008   sigmoid 54mm  . ADHD (attention deficit hyperactivity disorder)   . Allergy   . Anxiety   . Arthritis   . Cataract    bil cateracts removed  . Complication of anesthesia    first colonoscopy pt states she woke up  . Depression   . GERD (gastroesophageal reflux disease)   . Globus sensation   . Hyperlipidemia   . Insomnia   . Multinodular goiter (nontoxic)   . Psoriatic arthritis ([place])   . Spinal stenosis of lumbar region June 2016   MRI   . Statin intolerance 01/27/2013    Family History  Problem Relation Age of Onset  . Heart disease Mother   . Hyperlipidemia Mother   . Coronary artery disease Father 98       CABG in early 55's  . Hyperlipidemia Father   . Epilepsy Grandchild        Severe form  . Colon cancer Neg Hx   . Esophageal cancer Neg Hx   . Rectal cancer Neg Hx   . Stomach cancer Neg Hx     Social History   Socioeconomic History  . Marital status: Married    Spouse name: Not on file  . Number of children: 2  . Years of education: Not on file  . Highest education level: Not on file  Occupational History  . Occupation: disability  Tobacco Use  . Smoking status: Former Smoker    Types: Cigarettes    Quit date: 08/21/1971    Years since quitting: 49.2  . Smokeless tobacco: Never Used  Vaping Use  . Vaping Use: Never used  Substance and Sexual Activity  . Alcohol use: Yes    Alcohol/week: 7.0 standard drinks    Types: 7 Glasses of wine per week  . Drug use: No  . Sexual activity: Not on file  Other Topics Concern  . 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 Difficulty of Paying Living Expenses: Not hard at all  Food Insecurity: No Food Insecurity  . Worried About Charity fundraiser in the Last Year: Never true  . Ran Out of Food in the Last Year: Never true  Transportation Needs: No Transportation Needs  . Lack of Transportation (Medical): No  . Lack of Transportation (Non-Medical): No  Physical Activity: Not on file  Stress: No Stress Concern Present  . Feeling of Stress : Not at all  Social Connections: Unknown  . Frequency of Communication with Friends and Family: Not on file  . Frequency of Social Gatherings with Friends and Family: Not on file  . Attends Religious Services: Not on file  . Active Member of Clubs or Organizations: Not on file  . Attends Archivist Meetings: Not on file  . Marital Status: Married  Human resources officer Violence: Not on file    Past Medical History, Surgical history, Social history, and Family history were reviewed and updated as appropriate.   Please see review of systems for further details on the patient's review from today.   Objective:   Physical Exam:  There were no vitals taken for this visit.  Physical Exam Constitutional:      General: She is not in acute distress.    Appearance: Normal appearance. She is well-developed.  Musculoskeletal:        General: No deformity.  Neurological:     Mental Status: She is alert and oriented to person, place, and time.     Motor: No tremor.     Coordination: Coordination normal.     Gait: Gait normal.  Psychiatric:        Attention and Perception: Perception normal. She is inattentive.        Mood and Affect: Mood is not anxious or depressed. Affect is not labile, blunt, angry or inappropriate.        Speech: Speech normal.        Behavior: Behavior normal.        Thought Content: Thought content normal. Thought content does not include  homicidal or suicidal ideation. Thought content does not include homicidal or suicidal plan.        Cognition and Memory: Cognition normal.        Judgment: Judgment normal.     Comments: Insight intact. No auditory or visual hallucinations. No delusions.  Talkative per usual.     Lab Review:     Component Value Date/Time   NA 138 08/09/2020 1438   K 4.1 08/09/2020 1438   CL 101 08/09/2020 1438   CO2 31 08/09/2020 1438   GLUCOSE 79 08/09/2020 1438   BUN 19 08/09/2020 1438   CREATININE 0.68 08/09/2020 1438   CALCIUM 8.5 08/09/2020 1438   PROT 6.2 08/09/2020 1438   ALBUMIN 3.9 08/09/2020 1438   AST 16 08/09/2020 1438   ALT 12 08/09/2020 1438   ALKPHOS 50 08/09/2020 1438   BILITOT 0.5 08/09/2020 1438   GFRNONAA >60 02/18/2016 0322   GFRAA >60 02/18/2016 0322       Component Value Date/Time   WBC 4.6 08/07/2019 1159   RBC 3.96 08/07/2019 1159   HGB 13.0 08/07/2019 1159   HCT 38.3 08/07/2019 1159   PLT 238.0 08/07/2019 1159   MCV 96.6 08/07/2019 1159  MCH 31.0 02/18/2016 0322   MCHC 33.9 08/07/2019 1159   RDW 13.0 08/07/2019 1159   LYMPHSABS 2.1 08/07/2019 1159   MONOABS 0.4 08/07/2019 1159   EOSABS 0.1 08/07/2019 1159   BASOSABS 0.0 08/07/2019 1159    No results found for: POCLITH, LITHIUM   No results found for: PHENYTOIN, PHENOBARB, VALPROATE, CBMZ   .res Assessment: Plan:    Giulia was seen today for follow-up, sleeping problem and adhd.  Diagnoses and all orders for this visit:  Major depression, recurrent, full remission ([place]) -     FLUoxetine (PROZAC) 20 MG capsule; Take 1 capsule (20 mg total) by mouth daily.  Insomnia due to mental condition -     zolpidem (AMBIEN) 5 MG tablet; Take 1 tablet (5 mg total) by mouth at bedtime as needed for sleep. On empty stomach  Attention deficit hyperactivity disorder (ADHD), predominantly inattentive type -     lisdexamfetamine (VYVANSE) 60 MG capsule; Take 1 capsule (60 mg total) by mouth daily.  psoriatic  arthritis  Disc pros/cons of switching stimulants or adjusting dose.  Disc SE and withdrawal and dosing options.  Would be best to control it.  Discussed potential benefits, risks, and side effects of stimulants with patient to include increased heart rate, palpitations, insomnia, increased anxiety, increased irritability, or decreased appetite.  Instructed patient to contact office if experiencing any significant tolerability issues.  Disc risk steroids affecting mood and sleep.  Supportive therapy dealing with stubborn H with memory problems. Disc poss rheum consultation at [facility].    Increase Vyvanse to 60 mg AM and make sure irritability is not a problem.  Before the irritability might have been related to sleep deprivation. Consider further increase if tolerated.  Consider memantine off label because of chronic poorly controlled ADHD.  continue fluoxetine to 20 mg daily DT her fear of serotonin syndrome.  Thinks she had some of those sx in the past.  May have reduced benefit DT lower dose.  Return to Ambien 5 mg HS.   Disc SE.  If it fails then try Lunesta  Consider alternative treatments for ADD including off label.  This appt was 30 mins.   FU 3-4 mos.  Lynder Parents, MD, DFAPA   Please see After Visit Summary for patient specific instructions.  Future Appointments  Date Time Provider [place]  11/10/2020  2:00 PM Crecencio Mc, MD [id number]  07/19/2021 10:30 AM O'Brien-Blaney, Denisa L, LPN [id number]    No orders of the defined types were placed in this encounter.     -------------------------------

## 2020-11-10 ENCOUNTER — Ambulatory Visit: Payer: Medicare HMO | Admitting: Internal Medicine

## 2020-11-12 ENCOUNTER — Telehealth: Payer: Self-pay

## 2020-11-12 ENCOUNTER — Encounter: Payer: Self-pay | Admitting: Internal Medicine

## 2020-11-12 ENCOUNTER — Other Ambulatory Visit: Payer: Self-pay

## 2020-11-12 ENCOUNTER — Ambulatory Visit (INDEPENDENT_AMBULATORY_CARE_PROVIDER_SITE_OTHER): Payer: Medicare HMO | Admitting: Internal Medicine

## 2020-11-12 VITALS — BP 134/80 | HR 99 | Temp 98.5°F | Ht 64.49 in | Wt 169.8 lb

## 2020-11-12 DIAGNOSIS — F325 Major depressive disorder, single episode, in full remission: Secondary | ICD-10-CM

## 2020-11-12 DIAGNOSIS — Z86010 Personal history of colonic polyps: Secondary | ICD-10-CM

## 2020-11-12 DIAGNOSIS — R32 Unspecified urinary incontinence: Secondary | ICD-10-CM | POA: Diagnosis not present

## 2020-11-12 DIAGNOSIS — M4316 Spondylolisthesis, lumbar region: Secondary | ICD-10-CM

## 2020-11-12 DIAGNOSIS — R69 Illness, unspecified: Secondary | ICD-10-CM | POA: Diagnosis not present

## 2020-11-12 DIAGNOSIS — R151 Fecal smearing: Secondary | ICD-10-CM | POA: Diagnosis not present

## 2020-11-12 DIAGNOSIS — R159 Full incontinence of feces: Secondary | ICD-10-CM

## 2020-11-12 DIAGNOSIS — I6523 Occlusion and stenosis of bilateral carotid arteries: Secondary | ICD-10-CM | POA: Diagnosis not present

## 2020-11-12 DIAGNOSIS — L4052 Psoriatic arthritis mutilans: Secondary | ICD-10-CM | POA: Diagnosis not present

## 2020-11-12 MED ORDER — OXYCODONE-ACETAMINOPHEN 5-325 MG PO TABS: 1.0000 | ORAL_TABLET | Freq: Every day | ORAL | 0 refills | Status: DC | PRN

## 2020-11-12 NOTE — Progress Notes (Signed)
Subjective:  Patient ID: Madeline Young, female    DOB: Aug 22, 1956  Age: 65 y.o. MRN: [id number]  CC: The primary encounter diagnosis was Urinary and fecal incontinence. Diagnoses of Psoriatic arthritis, destructive type ([place]), Carotid artery stenosis, asymptomatic, bilateral, Fecal smearing, Hx of adenomatous polyp of colon, Major depressive disorder, single episode, in remission ([place]), and Spondylolisthesis of lumbar region were also pertinent to this visit.  HPI Madeline Young presents for MEDICATION REFILL  This visit occurred during the SARS-CoV-2 public health emergency.  Safety protocols were in place, including screening questions prior to the visit, additional usage of staff PPE, and extensive cleaning of exam room while observing appropriate contact time as indicated for disinfecting solutions.  Saw GI Carlean Purl for fecal incontinence .  No hemorrhoids found .  No colonoscopy offered.  Referred her for pelvic floor PT but she has not gone because she did not agree with diagnosis. I reviewed his notes with her today:    "I explained to her I think pelvic floor dysfunction or weakness could have some causative role in her problems with incontinence.  She was in some disbelief about this overall but is willing to try pelvic floor physical therapy.  We will make a referral to [facility] physical therapy in [place].  I encouraged her to try this as I think she is likely to get some improvement.  I do not think change in consistency of bowels etc. is needed.  She has normal formed bowel movements.  I suppose it is possible some of this is related to her spinal surgery but that is unlikely to change our approach.  Regarding this vulvar rash that she has and other rashes I do think she should go back to dermatology given her history of psoriatic arthritis.  She does seem to have a fairly straightforward perianal dermatitis probably from fecal incontinence and perhaps aggressive wiping.  I will  treat that with generic Mycolog and have asked her to avoid over wiping and trauma to the area and she can use a hair dryer on low to dry the area after showering etc." (She was unable to afford the Mycolog,  But has both triamcinolone and nystatin ointments at home )   Scheduled to see ENT for persistent rhinitis and a funny feeling in her right ear that she has self  diagnosed as Eustachian tube dysfunction.  However, her symptoms involve the external ear canal.  She has an appt on Monday with  [facility]   PSORIATIC ARTHRITIS:     Was Taking Enbrel,  but  has been off of the biologic for 6 months.  She feels generally good off of the drugs ,  But hands are continuining to develop arthritic  Changes.  Looking for a new rheumatologist in [place] daughter  now diagnosed with an autoimmune disorder after presenting with joint pain and fatigue .  MAJOR DEPRESSIVE DISORDER:  Feeling great  On prozac.  Gets depressed during arthritis flares only,  Still going to yoga and stretching daily for spine pain   Taking 1-2 oxycodone daily  For joint/back pain     Outpatient Medications Prior to Visit  Medication Sig Dispense Refill  . aspirin 81 MG tablet Take 81 mg by mouth daily.    . calcium carbonate (OS-CAL - DOSED IN MG OF ELEMENTAL CALCIUM) 1250 (500 Ca) MG tablet 1 tablet with meals    . calcium-vitamin D (OSCAL WITH D) 250-125 MG-UNIT tablet Take 1  tablet by mouth daily.    . celecoxib (CELEBREX) 200 MG capsule Take 1 capsule by mouth once daily 90 capsule 0  . cetirizine (ZYRTEC) 10 MG tablet 1 tablet    . cholecalciferol (VITAMIN D) 1000 UNITS tablet Take 1,000 Units by mouth daily.    . clobetasol (TEMOVATE) 0.05 % external solution APPLY TO AFFECTED AREAS ON SCALP TWICE DAILY UNTIL CLEAR    . diclofenac Sodium (VOLTAREN) 1 % GEL APPLY 2 GRAMS TO AFFECTED AREA 4 TIMES A DAY 300 g 3  . fluocinonide ointment (LIDEX) 0.05 % APPLY TWICE DAILY TO AFFECTED AREAS UNTIL IMPROVED THEN AS NEEDED  FOR FLARES    . FLUoxetine (PROZAC) 20 MG capsule Take 1 capsule (20 mg total) by mouth daily. 90 capsule 0  . ipratropium (ATROVENT) 0.03 % nasal spray Place into both nostrils.    . Lactobacillus (PROBIOTIC ACIDOPHILUS PO) Take 1 capsule by mouth daily.    Marland Kitchen lisdexamfetamine (VYVANSE) 60 MG capsule Take 1 capsule (60 mg total) by mouth daily. 30 capsule 0  . Multiple Vitamin (MULTIVITAMIN) tablet Take 1 tablet by mouth daily.    . Multiple Vitamins-Minerals (MULTIVITAMIN WOMEN 50+) TABS See admin instructions.    . nystatin-triamcinolone ointment (MYCOLOG) Apply 1 application topically 2 (two) times daily. 30 g 1  . Omega-3 Fatty Acids (FISH OIL) 1200 MG CAPS Take 1,200 mg by mouth daily.     Vladimir Faster Glycol-Propyl Glycol 0.4-0.3 % SOLN Place 1 drop into both eyes daily as needed (for dry eyes).    . triamcinolone cream (KENALOG) 0.1 % APPLY TO AFFECTED AREA TWICE A DAY UNTIL CLEAR THEN AS NEEDED    . zolpidem (AMBIEN) 5 MG tablet Take 1 tablet (5 mg total) by mouth at bedtime as needed for sleep. On empty stomach 30 tablet 2  . oxyCODONE-acetaminophen (PERCOCET/ROXICET) 5-325 MG tablet Take 1-2 tablets by mouth daily as needed for moderate pain. 60 tablet 0  . oxyCODONE-acetaminophen (PERCOCET/ROXICET) 5-325 MG tablet Take 1-2 tablets by mouth daily as needed for moderate pain. 60 tablet 0   No facility-administered medications prior to visit.    Review of Systems;  Patient denies headache, fevers, malaise, unintentional weight loss, skin rash, eye pain, sinus congestion and sinus pain, sore throat, dysphagia,  hemoptysis , cough, dyspnea, wheezing, chest pain, palpitations, orthopnea, edema, abdominal pain, nausea, melena, diarrhea, constipation, flank pain, dysuria, hematuria, urinary  Frequency, nocturia, numbness, tingling, seizures,  Focal weakness, Loss of consciousness,  Tremor, insomnia, depression, anxiety, and suicidal ideation.      Objective:  BP 134/80 (BP Location: Left  Arm, Patient Position: Sitting)   Pulse 99   Temp 98.5 F (36.9 C)   Ht 5' 4.49" (1.638 m)   Wt 169 lb 12.8 oz (77 kg)   SpO2 95%   BMI 28.71 kg/m   BP Readings from Last 3 Encounters:  11/12/20 134/80  09/08/20 (!) 142/60  08/09/20 124/80    Wt Readings from Last 3 Encounters:  11/12/20 169 lb 12.8 oz (77 kg)  09/08/20 169 lb (76.7 kg)  08/09/20 170 lb (77.1 kg)    General appearance: alert, cooperative and appears stated age Ears: normal TM's and external ear canals both ears Throat: lips, mucosa, and tongue normal; teeth and gums normal Neck: no adenopathy, no carotid bruit, supple, symmetrical, trachea midline and thyroid not enlarged, symmetric, no tenderness/mass/nodules Back: symmetric, no curvature. ROM normal. No CVA tenderness. Lungs: clear to auscultation bilaterally Heart: regular rate and rhythm, S1, S2 normal,  no murmur, click, rub or gallop Abdomen: soft, non-tender; bowel sounds normal; no masses,  no organomegaly Pulses: 2+ and symmetric Skin: Skin color, texture, turgor normal. No rashes or lesions Lymph nodes: Cervical, supraclavicular, and axillary nodes normal.  No results found for: HGBA1C  Lab Results  Component Value Date   CREATININE 0.68 08/09/2020   CREATININE 0.72 02/06/2020   CREATININE 0.65 08/07/2019    Lab Results  Component Value Date   WBC 4.6 08/07/2019   HGB 13.0 08/07/2019   HCT 38.3 08/07/2019   PLT 238.0 08/07/2019   GLUCOSE 79 08/09/2020   CHOL 397 (H) 01/13/2019   TRIG 97.0 01/13/2019   HDL 56.10 01/13/2019   LDLDIRECT 252.3 06/12/2012   LDLCALC 322 (H) 01/13/2019   ALT 12 08/09/2020   AST 16 08/09/2020   NA 138 08/09/2020   K 4.1 08/09/2020   CL 101 08/09/2020   CREATININE 0.68 08/09/2020   BUN 19 08/09/2020   CO2 31 08/09/2020   TSH 2.89 08/09/2020    MM 3D SCREEN BREAST BILATERAL  Result Date: 05/28/2020 CLINICAL DATA:  Screening. EXAM: DIGITAL SCREENING BILATERAL MAMMOGRAM WITH TOMO AND CAD COMPARISON:   Previous exam(s). ACR Breast Density Category b: There are scattered areas of fibroglandular density. FINDINGS: There are no findings suspicious for malignancy. Images were processed with CAD. IMPRESSION: No mammographic evidence of malignancy. A result letter of this screening mammogram will be mailed directly to the patient. RECOMMENDATION: Screening mammogram in one year. (Code:SM-B-01Y) BI-RADS CATEGORY  1: Negative. Electronically Signed   By: Lillia Mountain M.D.   On: 05/28/2020 14:58    Assessment & Plan:   Problem List Items Addressed This Visit      Unprioritized   Carotid artery stenosis, asymptomatic, bilateral    Untreated due to statin myalgia and intolerance to Repatha.  Last assessment was in 2018 by [facility].  < 40% stenosis bilaterally  Lab Results  Component Value Date   CHOL 397 (H) 01/13/2019   HDL 56.10 01/13/2019   LDLCALC 322 (H) 01/13/2019   LDLDIRECT 252.3 06/12/2012   TRIG 97.0 01/13/2019   CHOLHDL 7 01/13/2019         Fecal smearing    GI evaluation was done without colonoscopy or anuscopy and recommended pelvic floor PT, which I have reordered       Hx of adenomatous polyp of colon    Per GI repeat screening is due in 2023      Major depressive disorder, single episode, in remission ([place])    Stable, symptoms Managed with prozac by psychiatry. No changes today       Psoriatic arthritis, destructive type ([employer])    Has been untreated since stopping Enbrel  In August 2021  New referral pending patient callback       Relevant Medications   oxyCODONE-acetaminophen (PERCOCET/ROXICET) 5-325 MG tablet (Start on 12/12/2020)   oxyCODONE-acetaminophen (PERCOCET/ROXICET) 5-325 MG tablet   oxyCODONE-acetaminophen (PERCOCET/ROXICET) 5-325 MG tablet (Start on 01/11/2021)   Spondylolisthesis of lumbar region    With persistent low back pain and right sided sciatica despite prior surgery.  Her pain is not relieved with NSAIDs or recent ESI. .   Management of her pain  requires  refill on narcotics for twice daily  use . Marland Kitchen Refill history confirmed via [place] Controlled Substance database, accessed by me today..she only filled 2 of the 3 refills that were authorized in November,  And last refill was submitted on Jan 4 . refills for Feb, March  and April have  been authorized and sent electronically         Other Visit Diagnoses    Urinary and fecal incontinence    -  Primary   Relevant Orders   Ambulatory referral to Physical Therapy      I am having Madeline D. Woodcox "[name]" maintain her aspirin, cholecalciferol, Fish Oil, Polyethyl Glycol-Propyl Glycol, calcium-vitamin D, multivitamin, fluocinonide ointment, triamcinolone, clobetasol, diclofenac Sodium, Lactobacillus (PROBIOTIC ACIDOPHILUS PO), nystatin-triamcinolone ointment, celecoxib, zolpidem, lisdexamfetamine, FLUoxetine, cetirizine, ipratropium, Multivitamin Women 50+, calcium carbonate, oxyCODONE-acetaminophen, oxyCODONE-acetaminophen, and oxyCODONE-acetaminophen.  Meds ordered this encounter  Medications  . oxyCODONE-acetaminophen (PERCOCET/ROXICET) 5-325 MG tablet    Sig: Take 1-2 tablets by mouth daily as needed for moderate pain.    Dispense:  45 tablet    Refill:  0  . oxyCODONE-acetaminophen (PERCOCET/ROXICET) 5-325 MG tablet    Sig: Take 1-2 tablets by mouth daily as needed for moderate pain.    Dispense:  45 tablet    Refill:  0  . oxyCODONE-acetaminophen (PERCOCET/ROXICET) 5-325 MG tablet    Sig: Take 1-2 tablets by mouth daily as needed for moderate pain.    Dispense:  60 tablet    Refill:  0    Medications Discontinued During This Encounter  Medication Reason  . oxyCODONE-acetaminophen (PERCOCET/ROXICET) 5-325 MG tablet Reorder  . oxyCODONE-acetaminophen (PERCOCET/ROXICET) 5-325 MG tablet Reorder  A total of 40 minutes was spent with patient more than half of which was spent in counseling patient on the above mentioned issues , reviewing and explaining recent labs and imaging  studies done, and coordination of care.  Follow-up: Return in about 3 months (around 02/09/2021).   Crecencio Mc, MD

## 2020-11-12 NOTE — Patient Instructions (Signed)
I DO Recommend the pelvic floor muscle PT ;  [facility] has an excellent therapist Clarene Critchley (I'll make the referral)   The cream Dr Carlean Purl prescribed was a compound of triamcinolone and nystatin .  Let me know who you want ot see in [place] for the PA

## 2020-11-12 NOTE — Telephone Encounter (Signed)
Prior Authorization submitted and approved for ZOLPIDEM TABLET effective 10/09/2020-10/08/2021 with [employer] Part D   ID# [id number]

## 2020-11-14 NOTE — Assessment & Plan Note (Signed)
Stable, symptoms Managed with prozac by psychiatry. No changes today  

## 2020-11-14 NOTE — Assessment & Plan Note (Signed)
GI evaluation was done without colonoscopy or anuscopy and recommended pelvic floor PT, which I have reordered

## 2020-11-14 NOTE — Assessment & Plan Note (Signed)
Per GI repeat screening is due in 2023

## 2020-11-14 NOTE — Assessment & Plan Note (Signed)
Has been untreated since stopping Enbrel  In August 2021  New referral pending patient callback

## 2020-11-14 NOTE — Assessment & Plan Note (Addendum)
Untreated due to statin myalgia and intolerance to Repatha.  Last assessment was in 2018 by [facility].  < 40% stenosis bilaterally  Lab Results  Component Value Date   CHOL 397 (H) 01/13/2019   HDL 56.10 01/13/2019   LDLCALC 322 (H) 01/13/2019   LDLDIRECT 252.3 06/12/2012   TRIG 97.0 01/13/2019   CHOLHDL 7 01/13/2019

## 2020-11-14 NOTE — Assessment & Plan Note (Addendum)
With persistent low back pain and right sided sciatica despite prior surgery.  Her pain is not relieved with NSAIDs or recent ESI. .   Management of her pain requires  refill on narcotics for twice daily  use . Marland Kitchen Refill history confirmed via [place] Controlled Substance database, accessed by me today..she only filled 2 of the 3 refills that were authorized in November,  And last refill was submitted on Jan 4 . refills for Feb, March and April have  been authorized and sent electronically

## 2020-11-15 DIAGNOSIS — H903 Sensorineural hearing loss, bilateral: Secondary | ICD-10-CM | POA: Diagnosis not present

## 2020-11-15 DIAGNOSIS — H93299 Other abnormal auditory perceptions, unspecified ear: Secondary | ICD-10-CM | POA: Diagnosis not present

## 2020-11-15 DIAGNOSIS — H6121 Impacted cerumen, right ear: Secondary | ICD-10-CM | POA: Diagnosis not present

## 2020-11-22 DIAGNOSIS — M8589 Other specified disorders of bone density and structure, multiple sites: Secondary | ICD-10-CM | POA: Diagnosis not present

## 2020-11-22 DIAGNOSIS — L4050 Arthropathic psoriasis, unspecified: Secondary | ICD-10-CM | POA: Diagnosis not present

## 2020-11-22 DIAGNOSIS — R21 Rash and other nonspecific skin eruption: Secondary | ICD-10-CM | POA: Diagnosis not present

## 2020-11-22 DIAGNOSIS — M797 Fibromyalgia: Secondary | ICD-10-CM | POA: Diagnosis not present

## 2020-11-22 DIAGNOSIS — M150 Primary generalized (osteo)arthritis: Secondary | ICD-10-CM | POA: Diagnosis not present

## 2020-11-22 DIAGNOSIS — M791 Myalgia, unspecified site: Secondary | ICD-10-CM | POA: Diagnosis not present

## 2020-11-22 DIAGNOSIS — M19041 Primary osteoarthritis, right hand: Secondary | ICD-10-CM | POA: Diagnosis not present

## 2020-11-22 DIAGNOSIS — M5136 Other intervertebral disc degeneration, lumbar region: Secondary | ICD-10-CM | POA: Diagnosis not present

## 2020-11-22 DIAGNOSIS — M79641 Pain in right hand: Secondary | ICD-10-CM | POA: Diagnosis not present

## 2020-11-22 DIAGNOSIS — M79642 Pain in left hand: Secondary | ICD-10-CM | POA: Diagnosis not present

## 2020-11-22 DIAGNOSIS — Z79899 Other long term (current) drug therapy: Secondary | ICD-10-CM | POA: Diagnosis not present

## 2020-11-22 DIAGNOSIS — M25741 Osteophyte, right hand: Secondary | ICD-10-CM | POA: Diagnosis not present

## 2020-11-22 DIAGNOSIS — M19042 Primary osteoarthritis, left hand: Secondary | ICD-10-CM | POA: Diagnosis not present

## 2020-12-02 ENCOUNTER — Telehealth: Payer: Self-pay | Admitting: Psychiatry

## 2020-12-02 ENCOUNTER — Other Ambulatory Visit: Payer: Self-pay | Admitting: Psychiatry

## 2020-12-02 DIAGNOSIS — F900 Attention-deficit hyperactivity disorder, predominantly inattentive type: Secondary | ICD-10-CM

## 2020-12-02 MED ORDER — LISDEXAMFETAMINE DIMESYLATE 60 MG PO CAPS: 60.0000 mg | ORAL_CAPSULE | Freq: Every day | ORAL | 0 refills | Status: DC

## 2020-12-02 MED ORDER — LISDEXAMFETAMINE DIMESYLATE 60 MG PO CAPS: 60.0000 mg | ORAL_CAPSULE | ORAL | 0 refills | Status: DC

## 2020-12-02 NOTE — Telephone Encounter (Signed)
Pt would like a refill on Vyvanse. Please send to [facility] on [address] in [place].

## 2020-12-07 DIAGNOSIS — L309 Dermatitis, unspecified: Secondary | ICD-10-CM | POA: Diagnosis not present

## 2021-01-05 ENCOUNTER — Other Ambulatory Visit: Payer: Self-pay | Admitting: Internal Medicine

## 2021-02-09 ENCOUNTER — Ambulatory Visit (INDEPENDENT_AMBULATORY_CARE_PROVIDER_SITE_OTHER): Payer: Medicare HMO | Admitting: Internal Medicine

## 2021-02-09 ENCOUNTER — Encounter: Payer: Self-pay | Admitting: Internal Medicine

## 2021-02-09 ENCOUNTER — Other Ambulatory Visit: Payer: Self-pay

## 2021-02-09 VITALS — BP 110/64 | HR 75 | Temp 97.3°F | Resp 15 | Ht 64.0 in | Wt 170.0 lb

## 2021-02-09 DIAGNOSIS — H9201 Otalgia, right ear: Secondary | ICD-10-CM | POA: Diagnosis not present

## 2021-02-09 DIAGNOSIS — Z9229 Personal history of other drug therapy: Secondary | ICD-10-CM

## 2021-02-09 DIAGNOSIS — L4052 Psoriatic arthritis mutilans: Secondary | ICD-10-CM | POA: Diagnosis not present

## 2021-02-09 DIAGNOSIS — E7849 Other hyperlipidemia: Secondary | ICD-10-CM | POA: Diagnosis not present

## 2021-02-09 DIAGNOSIS — M4316 Spondylolisthesis, lumbar region: Secondary | ICD-10-CM | POA: Diagnosis not present

## 2021-02-09 DIAGNOSIS — R69 Illness, unspecified: Secondary | ICD-10-CM | POA: Diagnosis not present

## 2021-02-09 DIAGNOSIS — F5105 Insomnia due to other mental disorder: Secondary | ICD-10-CM | POA: Diagnosis not present

## 2021-02-09 DIAGNOSIS — G8929 Other chronic pain: Secondary | ICD-10-CM

## 2021-02-09 MED ORDER — OXYCODONE-ACETAMINOPHEN 5-325 MG PO TABS: 1.0000 | ORAL_TABLET | Freq: Every day | ORAL | 0 refills | Status: DC | PRN

## 2021-02-09 MED ORDER — ZOLPIDEM TARTRATE 5 MG PO TABS: 5.0000 mg | ORAL_TABLET | Freq: Every evening | ORAL | 5 refills | Status: DC | PRN

## 2021-02-09 MED ORDER — PREDNISONE 10 MG PO TABS: ORAL_TABLET | ORAL | 0 refills | Status: DC

## 2021-02-09 NOTE — Progress Notes (Addendum)
Subjective:  Patient ID: Madeline Young, female    DOB: 02/22/1956  Age: 65 y.o. MRN: [id number]  CC: The primary encounter diagnosis was Familial hyperlipidemia. 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  Persistent fullness and discomfort/pain  in right ear:  Saw ENT in [place],  Ear cleaned and steroid drops given.  No improvement.  Was granted a 2nd opinion with Tami Ribas  Who found nothing wrong with her ear. She states that she felt ridiculed by him for requesting a second opinion. .  Has TMJ, managed with an oral night guard.   Had a tooth pulled last year on the right lower mandible and several crowns made.  Dentist adjusted her night guard.last week,  But she continues to have periodic noncramping pain in right ear and a feeling of fullness  Using warm compresses and prn tylenol .   Transferring rheumatology care to [facility] from Dr Dossie Der. Needs referral    Outpatient Medications Prior to Visit  Medication Sig Dispense Refill  . aspirin 81 MG tablet Take 81 mg by mouth daily.    . calcium carbonate (OS-CAL - DOSED IN MG OF ELEMENTAL CALCIUM) 1250 (500 Ca) MG tablet 1 tablet with meals    . calcium-vitamin D (OSCAL WITH D) 250-125 MG-UNIT tablet Take 1 tablet by mouth  daily.    . celecoxib (CELEBREX) 200 MG capsule Take 1 capsule by mouth once daily 90 capsule 0  . cholecalciferol (VITAMIN D) 1000 UNITS tablet Take 1,000 Units by mouth daily.    . clobetasol (TEMOVATE) 0.05 % external solution APPLY TO AFFECTED AREAS ON SCALP TWICE DAILY UNTIL CLEAR    . diclofenac Sodium (VOLTAREN) 1 % GEL APPLY 2 GRAMS TO AFFECTED AREA 4 TIMES A DAY 300 g 3  . fluocinonide ointment (LIDEX) 0.05 % APPLY TWICE DAILY TO AFFECTED AREAS UNTIL IMPROVED THEN AS NEEDED FOR FLARES    . FLUoxetine (PROZAC) 20 MG capsule Take 1 capsule (20 mg total) by mouth daily. 90 capsule 0  . ipratropium (ATROVENT) 0.03 % nasal spray Place into both nostrils.    . Lactobacillus (PROBIOTIC ACIDOPHILUS PO) Take 1 capsule by mouth daily.    Marland Kitchen lisdexamfetamine (VYVANSE) 60 MG capsule Take 1 capsule (60 mg total) by mouth daily. 30 capsule 0  . lisdexamfetamine (VYVANSE) 60 MG capsule Take 1 capsule (60 mg total) by mouth every morning. 30 capsule 0  . lisdexamfetamine (VYVANSE) 60 MG capsule Take 1 capsule (60 mg total) by mouth every morning. 30 capsule 0  . Multiple Vitamin (MULTIVITAMIN) tablet Take 1 tablet by mouth daily.    Marland Kitchen nystatin-triamcinolone ointment (MYCOLOG) Apply 1 application topically 2 (two) times daily. 30 g 1  . Omega-3 Fatty Acids (FISH OIL) 1200 MG CAPS Take 1,200 mg by mouth daily.     Marland Kitchen  oxyCODONE-acetaminophen (PERCOCET/ROXICET) 5-325 MG tablet Take 1-2 tablets by mouth daily as needed for moderate pain. 45 tablet 0  . oxyCODONE-acetaminophen (PERCOCET/ROXICET) 5-325 MG tablet Take 1-2 tablets by mouth daily as needed for moderate pain. 60 tablet 0  . Polyethyl Glycol-Propyl Glycol 0.4-0.3 % SOLN Place 1 drop into both eyes daily as needed (for dry eyes).    . Probiotic Product (PROBIOTIC ADVANCED PO) Take 1 capsule by mouth daily.    Marland Kitchen triamcinolone cream (KENALOG) 0.1 % APPLY TO AFFECTED AREA TWICE A DAY UNTIL CLEAR THEN AS NEEDED    . oxyCODONE-acetaminophen  (PERCOCET/ROXICET) 5-325 MG tablet Take 1-2 tablets by mouth daily as needed for moderate pain. 45 tablet 0  . zolpidem (AMBIEN) 5 MG tablet Take 1 tablet (5 mg total) by mouth at bedtime as needed for sleep. On empty stomach 30 tablet 2  . cetirizine (ZYRTEC) 10 MG tablet 1 tablet (Patient not taking: Reported on 02/09/2021)    . Multiple Vitamins-Minerals (MULTIVITAMIN WOMEN 50+) TABS See admin instructions. (Patient not taking: Reported on 02/09/2021)     No facility-administered medications prior to visit.    Review of Systems;  Patient denies headache, fevers, malaise, unintentional weight loss, skin rash, eye pain, sinus congestion and sinus pain, sore throat, dysphagia,  hemoptysis , cough, dyspnea, wheezing, chest pain, palpitations, orthopnea, edema, abdominal pain, nausea, melena, diarrhea, constipation, flank pain, dysuria, hematuria, urinary  Frequency, nocturia, numbness, tingling, seizures,  Focal weakness, Loss of consciousness,  Tremor, insomnia, depression, anxiety, and suicidal ideation.      Objective:  BP 110/64 (BP Location: Left Arm, Patient Position: Sitting, Cuff Size: Normal)   Pulse 75   Temp (!) 97.3 F (36.3 C) (Temporal)   Resp 15   Ht 5\' 4"  (1.626 m)   Wt 170 lb (77.1 kg)   SpO2 99%   BMI 29.18 kg/m   BP Readings from Last 3 Encounters:  02/09/21 110/64  11/12/20 134/80  09/08/20 (!) 142/60    Wt Readings from Last 3 Encounters:  02/09/21 170 lb (77.1 kg)  11/12/20 169 lb 12.8 oz (77 kg)  09/08/20 169 lb (76.7 kg)    General appearance: alert, cooperative and appears stated age Ears: normal TM's and external ear canals both ears Throat: lips, mucosa, and tongue normal; teeth and gums normal Neck: no adenopathy, no carotid bruit, supple, symmetrical, trachea midline and thyroid not enlarged, symmetric, no tenderness/mass/nodules Back: symmetric, no curvature. ROM normal. No CVA tenderness. Lungs: clear to auscultation bilaterally Heart: regular  rate and rhythm, S1, S2 normal, no murmur, click, rub or gallop Abdomen: soft, non-tender; bowel sounds normal; no masses,  no organomegaly Pulses: 2+ and symmetric Skin: Skin color, texture, turgor normal. No rashes or lesions Lymph nodes: Cervical, supraclavicular, and axillary nodes normal.  No results found for: HGBA1C  Lab Results  Component Value Date   CREATININE 0.72 02/09/2021   CREATININE 0.68 08/09/2020   CREATININE 0.72 02/06/2020    Lab Results  Component Value Date   WBC 4.6 08/07/2019   HGB 13.0 08/07/2019   HCT 38.3 08/07/2019   PLT 238.0 08/07/2019   GLUCOSE 85 02/09/2021   CHOL 389 (H) 02/09/2021   TRIG 118.0 02/09/2021   HDL 66.50 02/09/2021   LDLDIRECT 252.3 06/12/2012   LDLCALC 299 (H) 02/09/2021   ALT 14 02/09/2021   AST 16 02/09/2021   NA 139 02/09/2021   K 4.3 02/09/2021   CL 102 02/09/2021   CREATININE 0.72 02/09/2021   BUN 31 (H) 02/09/2021  CO2 31 02/09/2021   TSH 2.89 08/09/2020    MM 3D SCREEN BREAST BILATERAL  Result Date: 05/28/2020 CLINICAL DATA:  Screening. EXAM: DIGITAL SCREENING BILATERAL MAMMOGRAM WITH TOMO AND CAD COMPARISON:  Previous exam(s). ACR Breast Density Category b: There are scattered areas of fibroglandular density. FINDINGS: There are no findings suspicious for malignancy. Images were processed with CAD. IMPRESSION: No mammographic evidence of malignancy. A result letter of this screening mammogram will be mailed directly to the patient. RECOMMENDATION: Screening mammogram in one year. (Code:SM-B-01Y) BI-RADS CATEGORY  1: Negative. Electronically Signed   By: Lillia Mountain M.D.   On: 05/28/2020 14:58    Assessment & Plan:   Problem List Items Addressed This Visit      Unprioritized   Familial hyperlipidemia - Primary    Untreated due to statin and Repatha intolerance.   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  Her pain is not relieved with NSAIDs or recent ESI. .   Management of her pain requires  refill on narcotics for twice daily  use . Marland Kitchen Refill history confirmed via [place] Controlled Substance database, accessed by me today..  May, June and July  have  been authorized and sent electronically        Chronic right ear pain    Seems to be related to her TMJ given 2 normal ENT evaluations.  Prednisone taper prescribed .  She  has not tolerated muscle relaxer      Relevant Medications   oxyCODONE-acetaminophen (PERCOCET/ROXICET) 5-325 MG tablet (Start on 02/16/2021)   predniSONE (DELTASONE) 10 MG tablet    Other Visit Diagnoses    Insomnia due to mental condition       Relevant Medications   zolpidem (AMBIEN) 5 MG tablet   COVID-19 vaccine series completed       Relevant Orders   SARS-CoV-2 Semi-Quantitative Total Antibody, Spike      I have discontinued Madeline D. Antonetti "[name]"'s cetirizine. I am also having her start on predniSONE. Additionally, I am having her maintain her aspirin, cholecalciferol, Fish Oil, Polyethyl Glycol-Propyl Glycol, calcium-vitamin D, multivitamin, fluocinonide ointment, triamcinolone cream, clobetasol, diclofenac Sodium, Lactobacillus (PROBIOTIC ACIDOPHILUS PO), nystatin-triamcinolone ointment, FLUoxetine,  ipratropium, calcium carbonate, oxyCODONE-acetaminophen, oxyCODONE-acetaminophen, lisdexamfetamine, lisdexamfetamine, lisdexamfetamine, celecoxib, Probiotic Product (PROBIOTIC ADVANCED PO), zolpidem, and oxyCODONE-acetaminophen.  Meds ordered this encounter  Medications  . zolpidem (AMBIEN) 5 MG tablet    Sig: Take 1 tablet (5 mg total) by mouth at bedtime as needed for sleep. On empty stomach    Dispense:  30 tablet    Refill:  5  . oxyCODONE-acetaminophen (PERCOCET/ROXICET) 5-325 MG tablet    Sig: Take 1-2 tablets by mouth  daily as needed for moderate pain.    Dispense:  60 tablet    Refill:  0  . predniSONE (DELTASONE) 10 MG tablet    Sig: 6 tablets on Day 1 , then reduce by 1 tablet daily until gone    Dispense:  21 tablet    Refill:  0    Medications Discontinued During This Encounter  Medication Reason  . cetirizine (ZYRTEC) 10 MG tablet   . Multiple Vitamins-Minerals (MULTIVITAMIN WOMEN 62+) TABS Duplicate  . zolpidem (AMBIEN) 5 MG tablet Reorder  . oxyCODONE-acetaminophen (PERCOCET/ROXICET) 5-325 MG tablet Reorder    Follow-up: Return in about 3 months (around 05/12/2021).   Crecencio Mc, MD

## 2021-02-09 NOTE — Patient Instructions (Signed)
We are trying a prednisone taper to try to resolve any inflammation in th e TM joint   SOFT DIET WILL HELP, DO  NOT CHEW GUM EITHER

## 2021-02-10 LAB — COMPREHENSIVE METABOLIC PANEL
ALT: 14 U/L (ref 0–35)
AST: 16 U/L (ref 0–37)
Albumin: 4.3 g/dL (ref 3.5–5.2)
Alkaline Phosphatase: 54 U/L (ref 39–117)
BUN: 31 mg/dL — ABNORMAL HIGH (ref 6–23)
CO2: 31 mEq/L (ref 19–32)
Calcium: 9.2 mg/dL (ref 8.4–10.5)
Chloride: 102 mEq/L (ref 96–112)
Creatinine, Ser: 0.72 mg/dL (ref 0.40–1.20)
GFR: 87.08 mL/min (ref >60.00)
Glucose, Bld: 85 mg/dL (ref 70–99)
Potassium: 4.3 mEq/L (ref 3.5–5.1)
Sodium: 139 mEq/L (ref 135–145)
Total Bilirubin: 0.5 mg/dL (ref 0.2–1.2)
Total Protein: 6.8 g/dL (ref 6.0–8.3)

## 2021-02-10 LAB — LIPID PANEL
Cholesterol: 389 mg/dL — ABNORMAL HIGH (ref 0–200)
HDL: 66.5 mg/dL (ref >39.00)
LDL Cholesterol: 299 mg/dL — ABNORMAL HIGH (ref 0–99)
NonHDL: 322.71
Total CHOL/HDL Ratio: 6
Triglycerides: 118 mg/dL (ref 0.0–149.0)
VLDL: 23.6 mg/dL (ref 0.0–40.0)

## 2021-02-11 DIAGNOSIS — G8929 Other chronic pain: Secondary | ICD-10-CM | POA: Insufficient documentation

## 2021-02-11 NOTE — Assessment & Plan Note (Signed)
Has been untreated since stopping Enbrel  In August 2021  New referral to [facility] Rheumatology in process

## 2021-02-11 NOTE — Assessment & Plan Note (Signed)
With persistent low back pain and right sided sciatica despite prior surgery.  Her pain is not relieved with NSAIDs or recent ESI. .   Management of her pain requires  refill on narcotics for twice daily  use . Marland Kitchen Refill history confirmed via [place] Controlled Substance database, accessed by me today..  May, June and July  have  been authorized and sent electronically

## 2021-02-11 NOTE — Assessment & Plan Note (Signed)
Seems to be related to her TMJ given 2 normal ENT evaluations.  Prednisone taper prescribed .  She  has not tolerated muscle relaxer

## 2021-02-13 NOTE — Assessment & Plan Note (Signed)
Untreated due to statin and Repatha intolerance.   Cardiac calcium score was zero in 2019  Lab Results  Component Value Date   CHOL 389 (H) 02/09/2021   HDL 66.50 02/09/2021   LDLCALC 299 (H) 02/09/2021   LDLDIRECT 252.3 06/12/2012   TRIG 118.0 02/09/2021   CHOLHDL 6 02/09/2021

## 2021-02-15 LAB — SARS-COV-2 SEMI-QUANTITATIVE TOTAL ANTIBODY, SPIKE: SARS COV2 AB, Total Spike Semi QN: >2500 U/mL — ABNORMAL HIGH (ref <0.8)

## 2021-02-16 DIAGNOSIS — L821 Other seborrheic keratosis: Secondary | ICD-10-CM | POA: Diagnosis not present

## 2021-02-16 DIAGNOSIS — L738 Other specified follicular disorders: Secondary | ICD-10-CM | POA: Diagnosis not present

## 2021-02-16 DIAGNOSIS — D2272 Melanocytic nevi of left lower limb, including hip: Secondary | ICD-10-CM | POA: Diagnosis not present

## 2021-02-16 DIAGNOSIS — L4059 Other psoriatic arthropathy: Secondary | ICD-10-CM | POA: Diagnosis not present

## 2021-02-16 DIAGNOSIS — D2271 Melanocytic nevi of right lower limb, including hip: Secondary | ICD-10-CM | POA: Diagnosis not present

## 2021-02-16 DIAGNOSIS — D2261 Melanocytic nevi of right upper limb, including shoulder: Secondary | ICD-10-CM | POA: Diagnosis not present

## 2021-02-16 DIAGNOSIS — D2262 Melanocytic nevi of left upper limb, including shoulder: Secondary | ICD-10-CM | POA: Diagnosis not present

## 2021-02-16 DIAGNOSIS — D225 Melanocytic nevi of trunk: Secondary | ICD-10-CM | POA: Diagnosis not present

## 2021-03-02 ENCOUNTER — Ambulatory Visit: Payer: Medicare HMO | Admitting: Psychiatry

## 2021-03-08 ENCOUNTER — Other Ambulatory Visit: Payer: Self-pay

## 2021-03-08 ENCOUNTER — Ambulatory Visit (INDEPENDENT_AMBULATORY_CARE_PROVIDER_SITE_OTHER): Payer: Medicare HMO | Admitting: Psychiatry

## 2021-03-08 ENCOUNTER — Encounter: Payer: Self-pay | Admitting: Psychiatry

## 2021-03-08 DIAGNOSIS — R69 Illness, unspecified: Secondary | ICD-10-CM | POA: Diagnosis not present

## 2021-03-08 DIAGNOSIS — F3342 Major depressive disorder, recurrent, in full remission: Secondary | ICD-10-CM | POA: Diagnosis not present

## 2021-03-08 DIAGNOSIS — F5105 Insomnia due to other mental disorder: Secondary | ICD-10-CM | POA: Diagnosis not present

## 2021-03-08 DIAGNOSIS — F900 Attention-deficit hyperactivity disorder, predominantly inattentive type: Secondary | ICD-10-CM

## 2021-03-08 MED ORDER — FLUOXETINE HCL 20 MG PO CAPS: 20.0000 mg | ORAL_CAPSULE | Freq: Every day | ORAL | 1 refills | Status: DC

## 2021-03-08 MED ORDER — LISDEXAMFETAMINE DIMESYLATE 60 MG PO CAPS: 60.0000 mg | ORAL_CAPSULE | ORAL | 0 refills | Status: DC

## 2021-03-08 MED ORDER — LISDEXAMFETAMINE DIMESYLATE 60 MG PO CAPS: 60.0000 mg | ORAL_CAPSULE | Freq: Every day | ORAL | 0 refills | Status: DC

## 2021-03-08 NOTE — Progress Notes (Signed)
Madeline Young [id number] 1956/03/11 65 y.o.   Subjective:   Patient ID:  Madeline Young is a 65 y.o. (DOB 01/04/1956) female.  Chief Complaint:  Chief Complaint  Patient presents with  . Follow-up  . Depression  . Anxiety  . ADHD    Depression        Associated symptoms include decreased concentration.  Associated symptoms include no suicidal ideas.  Madeline Young presents to the office today for follow-up of ADD and anxiety and depression and sleep.  seen August 65 2020.  No meds were changed.  On Vyvanse 50, fluoxetine 20, and Ambien 5.  Last seen February 65, 2021.  The following was noted: "Down".  Doesn't feel Vyvanse helping as much.  More scattered and doesn't finish things.  No SE with it. Sleep pattern is worse as Covid progressed.  Had reduced Zolpidem to 5 mg but then increased to 7.5 mg HS.  If takes Benadryl but can sleep but then has crying spells after it and hangover.  In the AM feels more negative and tearful.  AM is hard bc stiff in the morning. Plan:Continue Vyvanse 50 mg AM Increase fluoxetine to 30 mg daily.  She doesn't want to go to 40 bc fear of serotonin syndrome.  Thinks she had some of those sx in the past Continue zolpidem 5 mg daily.  04/67/21 appt, reported:  Better with increased fluoxetine re: mood. No SE with meds. Still ADD problems not as well controlled as she'd like. Still problems with sleep and doesn't think ambien workiing as well as it should.  Tried trazodone with Ambien but had hangover and difficulty waking up and not a deep good sleep.  Never tried trazodone alone.  Increased Ambien on her own to 10 mg and ran out early.  Reduced to 5 mg and added Benadryl.  Erratic sleep since Covid.  Go to bed same time 10.  Avoids alcohol 2 hours before. Primary problem is going to sleep.  History of 10 mg Ambien for years.  Says 7.5 mg Ambien will work. Caffeine none after noon. Plan: Increase Vyvanse 60 mg AM continue fluoxetine to  30 mg daily.  She doesn't want to go to 40 bc fear of serotonin syndrome.  Thinks she had some of those sx in the past.  It helped to increase Increase zolpidem 5-10 mg daily.  10/67/2021 appointment with the following noted: Went back down to 20 mg fluoxetine bc of more HA and fear of serotonin syndrome bc she feels she had it at some point in the past.  Taking 20 mg for mos. Still problems with sleep.  Down to 2.5 mg Ambien bc when takes more feels out of it the next day.   Thinks taking Ambien too long. Too much awakening. No marked benefit with increased Vyvanse to 60 mg and feels more irritable and edgy. However still distractible and inefficient and hard to finish things. Forgetful and loses things. Plan: Reduce Vyvanse to 50 mg AM continue fluoxetine to 20 mg daily DT her fear of serotonin syndrome.  Thinks she had some of those sx in the past.  May have reduced benefit DT lower dose. Trial Belsomra 15-20 mg or Dayvigo for sleep instead of Ambien bc hangover with 5 mg daily.  1/67/2022 appointment with the following noted: Phone call 08/16/2020:Rtc to patient and she did try the [place] 65 and reports having the "hang over and drowsy" effect. Same as the trazodone.  She is asking to try  Lunesta instead.  12/67/2021 she called back again stating she is wanted to go back to Ambien 5 mg. Ambien seems to work again and getting 8 hours now. Re: reduction vyvanse to 50 mg.  Now feels that it's now working well.  Very distractible and not finishing things.  Not affordable to take the biologicals any more and had to stop but pain is not worse. Plan increase Vyvanse to 60 mg daily. For sleep return to Ambien 5 mg nightly because of failures of alternatives.  5/67/2022 appointment with the following noted: Doing good overall.   Taking 7.5 mg Ambien.  Need to sleep and satisfied with it now. Overall in a good place emotionally and mentally.  Satisfied with meds. No SE now. Tolerated Vyvanse 60  fine and pleased with it.   Prednisone back a couple of weeks ago.  Pain can awaken her also. Pleased to sell property.  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  Helping to care for 67-month-old [place] in [place].  Past Psychiatric Medication Trials:  Zolpidem, trazodone hangover, Ambien, belsomra, Dayvigo to0 expensive, melatonin, Benadryl and combos (Lunesta never tried) Fluoxetine 60, sertraline NR, duloxetine, Lexapro, Wellbutrin, buspirone,  Ritalin [place], Concerta 65, Adderall 20 dizzy, Vyvanse 60  Under care at this office since July 2002   Review of Systems:  Review of Systems  HENT: Positive for ear pain.   Cardiovascular: Negative for palpitations.  Musculoskeletal: Positive for arthralgias and back pain.  Skin: Positive for rash.  Neurological: Negative for dizziness, tremors and weakness.  Psychiatric/Behavioral: Positive for decreased concentration, depression and sleep disturbance. Negative for agitation, behavioral problems, confusion, dysphoric mood, hallucinations, self-injury and suicidal ideas. The patient is not nervous/anxious and is not hyperactive.     Medications: I have reviewed the patient's current medications.  Current Outpatient Medications  Medication Sig Dispense Refill  . aspirin 81 MG tablet Take 81 mg by mouth daily.    . calcium carbonate (OS-CAL - DOSED IN MG OF ELEMENTAL CALCIUM) 1250 (500 Ca) MG tablet 1 tablet with meals    . calcium-vitamin D (OSCAL WITH D) 250-125 MG-UNIT tablet Take 1 tablet by mouth daily.    . celecoxib (CELEBREX) 200 MG capsule Take 1 capsule by mouth once daily 90 capsule 0  . cholecalciferol (VITAMIN D) 1000 UNITS tablet Take 1,000 Units by mouth daily.    . clobetasol (TEMOVATE) 0.05 % external solution APPLY TO AFFECTED AREAS ON SCALP TWICE DAILY UNTIL CLEAR    . diclofenac Sodium (VOLTAREN) 1 % GEL APPLY 2 GRAMS TO AFFECTED AREA 4 TIMES A DAY 300 g 3  . fluocinonide ointment (LIDEX)  0.05 % APPLY TWICE DAILY TO AFFECTED AREAS UNTIL IMPROVED THEN AS NEEDED FOR FLARES    . ipratropium (ATROVENT) 0.03 % nasal spray Place into both nostrils.    . Lactobacillus (PROBIOTIC ACIDOPHILUS PO) Take 1 capsule by mouth daily.    . Multiple Vitamin (MULTIVITAMIN) tablet Take 1 tablet by mouth daily.    Marland Kitchen nystatin-triamcinolone ointment (MYCOLOG) Apply 1 application topically 2 (two) times daily. 30 g 1  . Omega-3 Fatty Acids (FISH OIL) 1200 MG CAPS Take 1,200 mg by mouth daily.     Marland Kitchen oxyCODONE-acetaminophen (PERCOCET/ROXICET) 5-325 MG tablet Take 1-2 tablets by mouth daily as needed for moderate pain. 45 tablet 0  . oxyCODONE-acetaminophen (PERCOCET/ROXICET) 5-325 MG tablet Take 1-2 tablets by mouth daily as needed for moderate pain. 60 tablet 0  . oxyCODONE-acetaminophen (PERCOCET/ROXICET) 5-325 MG tablet Take  1-2 tablets by mouth daily as needed for moderate pain. 60 tablet 0  . Polyethyl Glycol-Propyl Glycol 0.4-0.3 % SOLN Place 1 drop into both eyes daily as needed (for dry eyes).    . predniSONE (DELTASONE) 10 MG tablet 6 tablets on Day 1 , then reduce by 1 tablet daily until gone 21 tablet 0  . Probiotic Product (PROBIOTIC ADVANCED PO) Take 1 capsule by mouth daily.    Marland Kitchen triamcinolone cream (KENALOG) 0.1 % APPLY TO AFFECTED AREA TWICE A DAY UNTIL CLEAR THEN AS NEEDED    . zolpidem (AMBIEN) 5 MG tablet Take 1 tablet (5 mg total) by mouth at bedtime as needed for sleep. On empty stomach 30 tablet 5  . FLUoxetine (PROZAC) 20 MG capsule Take 1 capsule (20 mg total) by mouth daily. 90 capsule 1  . lisdexamfetamine (VYVANSE) 60 MG capsule Take 1 capsule (60 mg total) by mouth daily. 30 capsule 0  . [START ON 04/05/2021] lisdexamfetamine (VYVANSE) 60 MG capsule Take 1 capsule (60 mg total) by mouth every morning. 30 capsule 0  . [START ON 05/03/2021] lisdexamfetamine (VYVANSE) 60 MG capsule Take 1 capsule (60 mg total) by mouth every morning. 30 capsule 0   No current facility-administered  medications for this visit.    Medication Side Effects: None  Allergies:  Allergies  Allergen Reactions  . Cosentyx [Secukinumab] Rash    Severe case of psorasis  . Sertraline Hcl Other (See Comments)    Made me crazy   . Avelox [Moxifloxacin Hcl In Nacl]     Pt cant remember reaction  . Latex Itching and Rash    Redness  . Macrobid [employer] Macro] Other (See Comments)    tired  . Statins Other (See Comments)    Muscle aches    Past Medical History:  Diagnosis Date  . 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 ADHD (attention deficit hyperactivity disorder)   . Allergy   . Anxiety   . Arthritis   . Cataract    bil cateracts removed  . Complication of anesthesia    first colonoscopy pt states she woke up  . Depression   . GERD (gastroesophageal reflux disease)   . Globus sensation   . Hyperlipidemia   . Insomnia   . Multinodular goiter (nontoxic)   . Psoriatic arthritis ([place])   . Spinal stenosis of lumbar region June 2016   MRI   . Statin intolerance 01/27/2013    Family History  Problem Relation Age of Onset  . Heart disease Mother   . Hyperlipidemia Mother   . Coronary artery disease Father 40       CABG in early 41's  . Hyperlipidemia Father   . Epilepsy Grandchild        Severe form  . Colon cancer Neg Hx   . Esophageal cancer Neg Hx   . Rectal cancer Neg Hx   . Stomach cancer Neg Hx     Social History   Socioeconomic History  . Marital status: Married    Spouse name: Not on file  . Number of children: 2  . Years of education: Not on file  . Highest education level: Not on file  Occupational History  . Occupation: disability  Tobacco Use  . Smoking status: Former Smoker    Types: Cigarettes    Quit date: 08/21/1971    Years since quitting: 49.5  . Smokeless tobacco: Never Used  Vaping Use  . Vaping Use: Never used  Substance  and Sexual Activity  . Alcohol use: Yes    Alcohol/week: 7.0 standard drinks    Types: 7 Glasses of  wine per week  . Drug use: No  . Sexual activity: Not on file  Other Topics Concern  . Not on file  Social History Narrative   Married, retired Therapist, sports   2 daughters some grandchildren   1 caffeinated drinks daily   1 alcoholic beverage daily   No tobacco      Social Determinants of Health   Financial Resource Strain: Low Risk   . Difficulty of Paying Living Expenses: Not hard at all  Food Insecurity: No Food Insecurity  . Worried About Charity fundraiser in the Last Year: Never true  . Ran Out of Food in the Last Year: Never true  Transportation Needs: No Transportation Needs  . Lack of Transportation (Medical): No  . Lack of Transportation (Non-Medical): No  Physical Activity: Not on file  Stress: No Stress Concern Present  . Feeling of Stress : Not at all  Social Connections: Unknown  . Frequency of Communication with Friends and Family: Not on file  . Frequency of Social Gatherings with Friends and Family: Not on file  . Attends Religious Services: Not on file  . Active Member of Clubs or Organizations: Not on file  . Attends Archivist Meetings: Not on file  . Marital Status: Married  Human resources officer Violence: Not on file    Past Medical History, Surgical history, Social history, and Family history were reviewed and updated as appropriate.   Please see review of systems for further details on the patient's review from today.   Objective:   Physical Exam:  There were no vitals taken for this visit.  Physical Exam Constitutional:      General: She is not in acute distress.    Appearance: Normal appearance. She is well-developed.  Musculoskeletal:        General: No deformity.  Neurological:     Mental Status: She is alert and oriented to person, place, and time.     Motor: No tremor.     Coordination: Coordination normal.     Gait: Gait normal.  Psychiatric:        Attention and Perception: Perception normal. She is inattentive.        Mood and  Affect: Mood is not anxious or depressed. Affect is not labile, blunt, angry or inappropriate.        Speech: Speech normal.        Behavior: Behavior normal.        Thought Content: Thought content normal. Thought content does not include homicidal or suicidal ideation. Thought content does not include homicidal or suicidal plan.        Cognition and Memory: Cognition normal.        Judgment: Judgment normal.     Comments: Insight intact. No auditory or visual hallucinations. No delusions.  Talkative per usual. Mood good.     Lab Review:     Component Value Date/Time   NA 139 02/09/2021 1409   K 4.3 02/09/2021 1409   CL 102 02/09/2021 1409   CO2 31 02/09/2021 1409   GLUCOSE 85 02/09/2021 1409   BUN 31 (H) 02/09/2021 1409   CREATININE 0.72 02/09/2021 1409   CALCIUM 9.2 02/09/2021 1409   PROT 6.8 02/09/2021 1409   ALBUMIN 4.3 02/09/2021 1409   AST 16 02/09/2021 1409   ALT 14 02/09/2021 1409   ALKPHOS 54  02/09/2021 1409   BILITOT 0.5 02/09/2021 1409   GFRNONAA >60 02/18/2016 0322   GFRAA >60 02/18/2016 0322       Component Value Date/Time   WBC 4.6 08/07/2019 1159   RBC 3.96 08/07/2019 1159   HGB 13.0 08/07/2019 1159   HCT 38.3 08/07/2019 1159   PLT 238.0 08/07/2019 1159   MCV 96.6 08/07/2019 1159   MCH 31.0 02/18/2016 0322   MCHC 33.9 08/07/2019 1159   RDW 13.0 08/07/2019 1159   LYMPHSABS 2.1 08/07/2019 1159   MONOABS 0.4 08/07/2019 1159   EOSABS 0.1 08/07/2019 1159   BASOSABS 0.0 08/07/2019 1159    No results found for: POCLITH, LITHIUM   No results found for: PHENYTOIN, PHENOBARB, VALPROATE, CBMZ   .res Assessment: Plan:    Yuko was seen today for follow-up, depression, anxiety and adhd.  Diagnoses and all orders for this visit:  Major depression, recurrent, full remission ([place]) -     FLUoxetine (PROZAC) 20 MG capsule; Take 1 capsule (20 mg total) by mouth daily.  Attention deficit hyperactivity disorder (ADHD), predominantly inattentive type -      lisdexamfetamine (VYVANSE) 60 MG capsule; Take 1 capsule (60 mg total) by mouth daily. -     lisdexamfetamine (VYVANSE) 60 MG capsule; Take 1 capsule (60 mg total) by mouth every morning. -     lisdexamfetamine (VYVANSE) 60 MG capsule; Take 1 capsule (60 mg total) by mouth every morning.  Insomnia due to mental condition  psoriatic arthritis  Disc pros/cons of switching stimulants or adjusting dose.  Disc SE and withdrawal and dosing options.  Would be best to control it.  Discussed potential benefits, risks, and side effects of stimulants with patient to include increased heart rate, palpitations, insomnia, increased anxiety, increased irritability, or decreased appetite.  Instructed patient to contact office if experiencing any significant tolerability issues.  Disc risk steroids affecting mood and sleep.  Supportive therapy dealing with stubborn H with memory problems. Disc poss rheum consultation at [facility].    Benefit and tolerating Vyvanse to 60 mg AM.  Before the irritability might have been related to sleep deprivation. Consider further increase if tolerated.  Consider memantine off label because of chronic poorly controlled ADHD.  continue fluoxetine to 20 mg daily DT her fear of serotonin syndrome.  Thinks she had some of those sx in the past.  May have reduced benefit DT lower dose.  LED Ambien 5-10 mg HS.   Disc SE.  If it fails then try Lunesta  Consider alternative treatments for ADD including off label.  This appt was 30 mins.   FU 6 mos.  Lynder Parents, MD, DFAPA   Please see After Visit Summary for patient specific instructions.  Future Appointments  Date Time Provider [place]  05/12/2021  9:30 AM Crecencio Mc, MD [facility]  07/19/2021 10:30 AM [facility] ADVISOR [facility]    No orders of the defined types were placed in this encounter.     -------------------------------

## 2021-03-28 ENCOUNTER — Other Ambulatory Visit: Payer: Self-pay | Admitting: Internal Medicine

## 2021-03-28 MED ORDER — CELECOXIB 200 MG PO CAPS
200.0000 mg | ORAL_CAPSULE | Freq: Every day | ORAL | 0 refills | Status: DC
Start: 2021-03-28 — End: 2021-07-21

## 2021-03-28 MED ORDER — OXYCODONE-ACETAMINOPHEN 5-325 MG PO TABS: 1.0000 | ORAL_TABLET | Freq: Every day | ORAL | 0 refills | Status: DC | PRN

## 2021-03-28 NOTE — Telephone Encounter (Signed)
PT needs a refill on oxyCODONE-acetaminophen (PERCOCET/ROXICET) 5-325 MG tablet sent to [facility].. she would like 2 rx sent   celecoxib (CELEBREX) 200 MG capsule needs to go to [facility], she would like 90 days

## 2021-03-28 NOTE — Addendum Note (Signed)
Addended by: Thressa Sheller on: 03/28/2021 01:06 PM   Modules accepted: Orders

## 2021-03-28 NOTE — Telephone Encounter (Signed)
Oxycodone last sent 60 tablets 02/16/21 and Celebrex last sent 01/05/21 90 pills no refill.   Celebrex sent in, Oxycodone pended.   Patient last seen 02/09/21.

## 2021-03-29 NOTE — Telephone Encounter (Signed)
Medication was sent in. Patient will be informed by the pharmacy

## 2021-04-19 ENCOUNTER — Other Ambulatory Visit: Payer: Self-pay | Admitting: Internal Medicine

## 2021-05-12 ENCOUNTER — Ambulatory Visit (INDEPENDENT_AMBULATORY_CARE_PROVIDER_SITE_OTHER): Payer: Medicare HMO | Admitting: Internal Medicine

## 2021-05-12 ENCOUNTER — Encounter: Payer: Self-pay | Admitting: Internal Medicine

## 2021-05-12 ENCOUNTER — Other Ambulatory Visit: Payer: Self-pay

## 2021-05-12 DIAGNOSIS — F5101 Primary insomnia: Secondary | ICD-10-CM | POA: Diagnosis not present

## 2021-05-12 DIAGNOSIS — E7849 Other hyperlipidemia: Secondary | ICD-10-CM

## 2021-05-12 DIAGNOSIS — L4052 Psoriatic arthritis mutilans: Secondary | ICD-10-CM | POA: Diagnosis not present

## 2021-05-12 DIAGNOSIS — M5431 Sciatica, right side: Secondary | ICD-10-CM | POA: Diagnosis not present

## 2021-05-12 DIAGNOSIS — M5432 Sciatica, left side: Secondary | ICD-10-CM | POA: Diagnosis not present

## 2021-05-12 DIAGNOSIS — R69 Illness, unspecified: Secondary | ICD-10-CM | POA: Diagnosis not present

## 2021-05-12 NOTE — Assessment & Plan Note (Addendum)
ambien dependent. Multiple trials of other meds including melatonin, trazodone and benadryl which were not tolerated due to side effects .  Using 5 mg ambien .  Has not tried temazepam,  Worried about effect of ambien on her memory..  Homeopathic dose of alprazolam works but leaves her very groggy the next day due to interactions with other drugs.

## 2021-05-12 NOTE — Progress Notes (Signed)
Subjective:  Patient ID: Madeline Young, female    DOB: 05-01-1956  Age: 65 y.o. 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  HPI [name] presents for follow up on chronic pain  This visit occurred during the SARS-CoV-2 public health emergency.  Safety protocols were in place, including screening questions prior to the visit, additional usage of staff PPE, and extensive cleaning of exam room while observing appropriate contact time as indicated for disinfecting solutions.   End of Life discussion goals done today at patient's request. Living will and Advance Directives brought in today .  Has a healthcare POA ;; MOST order discussed but not signed.   Chronic joint pain:  secondary to inflammatory arthritis . Her pain is managed oxycodone, 1 to 2 daily with Refill history confirmed via [place] Controlled Substance databas, accessed by me today.. last refill of oxy/apap July 22 #60, ambien 6/20  Arthritis:  seeing Dossie Der for now.   Had a flare up of rheumatoid pain  and TMJ pain which was resolved with adjustment of mouth guard.  Hands have been swollen at the MCPs  and right midfoot (site of prior surgery),  could not weight bear,  and had excessive fatigue/weakness.    summer heat,  family stressors (pregnant daughter miscarried,  dtr with chronically ill son had more problems).   Used voltaren,  ice  etc in addition to opioids.  Tried mag/zinc for recurrent foot cramps.  Has decided to see the new rheumatologist at [facility]. For second opinion on use of biologics   Appt in December.   Did a 12 day prednisone taper on her own and symptoms are finally starting to resolve. Sees Syed in the interim ,,  in August .     Outpatient Medications Prior to Visit  Medication Sig Dispense Refill   aspirin 81 MG tablet Take 81 mg by mouth daily.     calcium-vitamin D  (OSCAL WITH D) 250-125 MG-UNIT tablet Take 1 tablet by mouth daily.     celecoxib (CELEBREX) 200 MG capsule Take 1 capsule (200 mg total) by mouth daily. 90 capsule 0   cholecalciferol (VITAMIN D) 1000 UNITS tablet Take 1,000 Units by mouth daily.     diclofenac Sodium (VOLTAREN) 1 % GEL APPLY 2 GRAMS TO AFFECTED AREA 4 TIMES A DAY 300 g 3   fluocinonide ointment (LIDEX) 0.05 % APPLY TWICE DAILY TO AFFECTED AREAS UNTIL IMPROVED THEN AS NEEDED FOR FLARES     FLUoxetine (PROZAC) 20 MG capsule Take 1 capsule (20 mg total) by mouth daily. 90 capsule 1   ipratropium (ATROVENT) 0.03 % nasal spray Place into both nostrils.     lisdexamfetamine (VYVANSE) 60 MG capsule Take 1 capsule (60 mg total) by mouth daily. 30 capsule 0   lisdexamfetamine (VYVANSE) 60 MG capsule Take 1 capsule (60 mg total) by mouth every morning. 30 capsule 0   lisdexamfetamine (VYVANSE) 60 MG capsule Take 1 capsule (60 mg total) by mouth every morning. 30 capsule 0   MAGNESIUM-ZINC PO Take 1 tablet by mouth daily.     Multiple Vitamin (MULTIVITAMIN) tablet Take 1 tablet by mouth daily.     nystatin-triamcinolone ointment (MYCOLOG) Apply 1 application topically 2 (two) times daily. 30 g 1   Omega-3 Fatty Acids (FISH OIL) 1200 MG CAPS Take 1,200 mg by mouth daily.      oxyCODONE-acetaminophen (PERCOCET/ROXICET) 5-325 MG tablet  Take 1-2 tablets by mouth daily as needed for moderate pain. 45 tablet 0   oxyCODONE-acetaminophen (PERCOCET/ROXICET) 5-325 MG tablet Take 1-2 tablets by mouth daily as needed for moderate pain. 60 tablet 0   oxyCODONE-acetaminophen (PERCOCET/ROXICET) 5-325 MG tablet Take 1-2 tablets by mouth daily as needed for moderate pain. 60 tablet 0   Polyethyl Glycol-Propyl Glycol 0.4-0.3 % SOLN Place 1 drop into both eyes daily as needed (for dry eyes).     Probiotic Product (PROBIOTIC ADVANCED PO) Take 1 capsule by mouth daily.     triamcinolone cream (KENALOG) 0.1 % APPLY TO AFFECTED AREA TWICE A DAY UNTIL CLEAR THEN  AS NEEDED     zolpidem (AMBIEN) 5 MG tablet Take 1 tablet (5 mg total) by mouth at bedtime as needed for sleep. On empty stomach 30 tablet 5   calcium carbonate (OS-CAL - DOSED IN MG OF ELEMENTAL CALCIUM) 1250 (500 Ca) MG tablet 1 tablet with meals (Patient not taking: Reported on 05/12/2021)     clobetasol (TEMOVATE) 0.05 % external solution APPLY TO AFFECTED AREAS ON SCALP TWICE DAILY UNTIL CLEAR (Patient not taking: Reported on 05/12/2021)     Lactobacillus (PROBIOTIC ACIDOPHILUS PO) Take 1 capsule by mouth daily. (Patient not taking: Reported on 05/12/2021)     predniSONE (DELTASONE) 10 MG tablet 6 tablets on Day 1 , then reduce by 1 tablet daily until gone (Patient not taking: Reported on 05/12/2021) 21 tablet 0   No facility-administered medications prior to visit.    Review of Systems;  Patient denies headache, fevers, malaise, unintentional weight loss, skin rash, eye pain, sinus congestion and sinus pain, sore throat, dysphagia,  hemoptysis , cough, dyspnea, wheezing, chest pain, palpitations, orthopnea, edema, abdominal pain, nausea, melena, diarrhea, constipation, flank pain, dysuria, hematuria, urinary  Frequency, nocturia, numbness, tingling, seizures,  Focal weakness, Loss of consciousness,  Tremor, insomnia, depression, anxiety, and suicidal ideation.      Objective:  BP (!) 112/58 (BP Location: Left Arm, Patient Position: Sitting, Cuff Size: Large)   Pulse 83   Temp 98.4 F (36.9 C) (Oral)   Ht '5\' 4"'$  (1.626 m)   Wt 169 lb 3.2 oz (76.7 kg)   SpO2 99%   BMI 29.04 kg/m   BP Readings from Last 3 Encounters:  05/12/21 (!) 112/58  02/09/21 110/64  11/12/20 134/80    Wt Readings from Last 3 Encounters:  05/12/21 169 lb 3.2 oz (76.7 kg)  02/09/21 170 lb (77.1 kg)  11/12/20 169 lb 12.8 oz (77 kg)    General appearance: alert, cooperative and appears stated age Ears: normal TM's and external ear canals both ears Throat: lips, mucosa, and tongue normal; teeth and gums  normal Neck: no adenopathy, no carotid bruit, supple, symmetrical, trachea midline and thyroid not enlarged, symmetric, no tenderness/mass/nodules Back: symmetric, no curvature. ROM normal. No CVA tenderness. Lungs: clear to auscultation bilaterally Heart: regular rate and rhythm, S1, S2 normal, no murmur, click, rub or gallop Abdomen: soft, non-tender; bowel sounds normal; no masses,  no organomegaly Pulses: 2+ and symmetric Skin: Skin color, texture, turgor normal. No rashes or lesions Lymph nodes: Cervical, supraclavicular, and axillary nodes normal.  No results found for: HGBA1C  Lab Results  Component Value Date   CREATININE 0.72 02/09/2021   CREATININE 0.68 08/09/2020   CREATININE 0.72 02/06/2020    Lab Results  Component Value Date   WBC 4.6 08/07/2019   HGB 13.0 08/07/2019   HCT 38.3 08/07/2019   PLT 238.0 08/07/2019   GLUCOSE 85  02/09/2021   CHOL 389 (H) 02/09/2021   TRIG 118.0 02/09/2021   HDL 66.50 02/09/2021   LDLDIRECT 252.3 06/12/2012   LDLCALC 299 (H) 02/09/2021   ALT 14 02/09/2021   AST 16 02/09/2021   NA 139 02/09/2021   K 4.3 02/09/2021   CL 102 02/09/2021   CREATININE 0.72 02/09/2021   BUN 31 (H) 02/09/2021   CO2 31 02/09/2021   TSH 2.89 08/09/2020    MM 3D SCREEN BREAST BILATERAL  Result Date: 05/28/2020 CLINICAL DATA:  Screening. EXAM: DIGITAL SCREENING BILATERAL MAMMOGRAM WITH TOMO AND CAD COMPARISON:  Previous exam(s). ACR Breast Density Category b: There are scattered areas of fibroglandular density. FINDINGS: There are no findings suspicious for malignancy. Images were processed with CAD. IMPRESSION: No mammographic evidence of malignancy. A result letter of this screening mammogram will be mailed directly to the patient. RECOMMENDATION: Screening mammogram in one year. (Code:SM-B-01Y) BI-RADS CATEGORY  1: Negative. Electronically Signed   By: Lillia Mountain M.D.   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  Used 12 day prednisone taper when everything else failed.  wantst second opinion  from [facility] rheum for use of biologics  nicole Orzechkowsi        Relevant Medications   oxyCODONE-acetaminophen (PERCOCET/ROXICET) 5-325 MG tablet (Start on 07/28/2021)   oxyCODONE-acetaminophen (PERCOCET/ROXICET) 5-325 MG tablet (Start on 06/28/2021)   oxyCODONE-acetaminophen (PERCOCET/ROXICET) 5-325 MG tablet (Start on 05/29/2021)   Other Relevant Orders   Sedimentation rate   C-reactive protein   Ambulatory referral to Rheumatology   Insomnia    ambien dependent. Multiple trials of other meds including melatonin, trazodone and benadryl which were not tolerated due to side effects .  Using 5 mg ambien .  Has not tried temazepam,  Worried about effect of ambien on her memory..  Homeopathic dose of alprazolam works but leaves her very groggy the next day due to interactions with other drugs.         Sciatica    With persistent low back pain and right sided sciatica despite prior surgery.  Her pain is not relieved with NSAIDs or recent ESI. .   Management of her pain requires  refill on narcotics for twice daily  use . Marland Kitchen Refill history confirmed via [place] Controlled Substance database, accessed by me today..  August, September and October   have  been authorized and sent electronically         Meds ordered this encounter  Medications   oxyCODONE-acetaminophen (PERCOCET/ROXICET) 5-325 MG tablet    Sig: Take 1-2 tablets by mouth daily as needed for moderate pain.    Dispense:  45 tablet    Refill:  0   oxyCODONE-acetaminophen (PERCOCET/ROXICET) 5-325 MG tablet    Sig: Take 1-2 tablets by mouth daily as needed for moderate pain.    Dispense:  45 tablet    Refill:  0   oxyCODONE-acetaminophen (PERCOCET/ROXICET)  5-325 MG tablet    Sig: Take 1-2 tablets by mouth daily as needed for moderate pain.    Dispense:  45 tablet    Refill:  0    I provided  30 minutes  during this encounter reviewing patient's current problems and past treatments for psoriatric arthritis,  labs  and imaging studies, providing counseling on the above mentioned problems I na face to face visit  , and coordination  of care .  There are no discontinued medications.  Follow-up: No follow-ups on file.   Crecencio Mc, MD

## 2021-05-12 NOTE — Assessment & Plan Note (Signed)
Recent flare was prolonged and debilitating and affected the right side of body esp right foot.  Used 12 day prednisone taper when everything else failed.  wantst second opinion  from [facility] rheum for use of biologics  nicole Orzechkowsi

## 2021-05-12 NOTE — Patient Instructions (Signed)
If you have another arthritis flare,  you can schedule a lab appt for the ESR and CRP (inflammatory makers)   You can wait until the Moderna vaccine comes out in the fall for your COVID booster

## 2021-05-15 MED ORDER — OXYCODONE-ACETAMINOPHEN 5-325 MG PO TABS: 1.0000 | ORAL_TABLET | Freq: Every day | ORAL | 0 refills | Status: DC | PRN

## 2021-05-15 NOTE — Assessment & Plan Note (Signed)
With persistent low back pain and right sided sciatica despite prior surgery.  Her pain is not relieved with NSAIDs or recent ESI. .   Management of her pain requires  refill on narcotics for twice daily  use . Marland Kitchen Refill history confirmed via [place] Controlled Substance database, accessed by me today..  August, September and October   have  been authorized and sent electronically

## 2021-05-25 DIAGNOSIS — M150 Primary generalized (osteo)arthritis: Secondary | ICD-10-CM | POA: Diagnosis not present

## 2021-05-25 DIAGNOSIS — M797 Fibromyalgia: Secondary | ICD-10-CM | POA: Diagnosis not present

## 2021-05-25 DIAGNOSIS — R21 Rash and other nonspecific skin eruption: Secondary | ICD-10-CM | POA: Diagnosis not present

## 2021-05-25 DIAGNOSIS — L4050 Arthropathic psoriasis, unspecified: Secondary | ICD-10-CM | POA: Diagnosis not present

## 2021-05-25 DIAGNOSIS — Z79899 Other long term (current) drug therapy: Secondary | ICD-10-CM | POA: Diagnosis not present

## 2021-05-25 DIAGNOSIS — M5136 Other intervertebral disc degeneration, lumbar region: Secondary | ICD-10-CM | POA: Diagnosis not present

## 2021-06-14 ENCOUNTER — Telehealth: Payer: Self-pay | Admitting: Psychiatry

## 2021-06-14 NOTE — Telephone Encounter (Signed)
Pt needs a refill on her vyvanse 60 mg to be [facility] to [facility] [place] [address] in [place]

## 2021-06-15 ENCOUNTER — Other Ambulatory Visit: Payer: Self-pay

## 2021-06-15 DIAGNOSIS — F900 Attention-deficit hyperactivity disorder, predominantly inattentive type: Secondary | ICD-10-CM

## 2021-06-15 MED ORDER — LISDEXAMFETAMINE DIMESYLATE 60 MG PO CAPS: 60.0000 mg | ORAL_CAPSULE | ORAL | 0 refills | Status: DC

## 2021-06-15 MED ORDER — LISDEXAMFETAMINE DIMESYLATE 60 MG PO CAPS: 60.0000 mg | ORAL_CAPSULE | Freq: Every day | ORAL | 0 refills | Status: DC

## 2021-06-15 NOTE — Telephone Encounter (Signed)
Pended.

## 2021-06-29 ENCOUNTER — Ambulatory Visit (INDEPENDENT_AMBULATORY_CARE_PROVIDER_SITE_OTHER): Payer: Medicare HMO

## 2021-06-29 ENCOUNTER — Encounter: Payer: Self-pay | Admitting: Podiatry

## 2021-06-29 ENCOUNTER — Other Ambulatory Visit: Payer: Self-pay

## 2021-06-29 ENCOUNTER — Ambulatory Visit: Payer: Medicare HMO | Admitting: Podiatry

## 2021-06-29 DIAGNOSIS — M778 Other enthesopathies, not elsewhere classified: Secondary | ICD-10-CM

## 2021-06-29 DIAGNOSIS — L4050 Arthropathic psoriasis, unspecified: Secondary | ICD-10-CM | POA: Insufficient documentation

## 2021-06-29 DIAGNOSIS — Z889 Allergy status to unspecified drugs, medicaments and biological substances status: Secondary | ICD-10-CM | POA: Insufficient documentation

## 2021-06-29 DIAGNOSIS — S9032XA Contusion of left foot, initial encounter: Secondary | ICD-10-CM

## 2021-06-29 DIAGNOSIS — B351 Tinea unguium: Secondary | ICD-10-CM | POA: Diagnosis not present

## 2021-06-29 DIAGNOSIS — M25549 Pain in joints of unspecified hand: Secondary | ICD-10-CM | POA: Insufficient documentation

## 2021-06-29 DIAGNOSIS — M797 Fibromyalgia: Secondary | ICD-10-CM | POA: Insufficient documentation

## 2021-06-29 DIAGNOSIS — L603 Nail dystrophy: Secondary | ICD-10-CM | POA: Diagnosis not present

## 2021-06-29 DIAGNOSIS — M159 Polyosteoarthritis, unspecified: Secondary | ICD-10-CM | POA: Insufficient documentation

## 2021-06-29 DIAGNOSIS — M5136 Other intervertebral disc degeneration, lumbar region: Secondary | ICD-10-CM | POA: Insufficient documentation

## 2021-06-29 DIAGNOSIS — Z79899 Other long term (current) drug therapy: Secondary | ICD-10-CM | POA: Insufficient documentation

## 2021-06-29 NOTE — Progress Notes (Signed)
Subjective:  Patient ID: Madeline Young, female    DOB: 1956/06/24,  MRN: [id number] HPI Chief Complaint  Patient presents with   Foot Pain    Right foot - previous surgery with Dr. Milinda Pointer 10 years ago, noticing big toe is longer and separating from the 2nd toe, causing shoe problems   Nail Problem    Hallux left - check toenail, thick and discolored   New Patient (Initial Visit)    Est pt 12/2017    65 y.o. female presents with the above complaint.   ROS: Denies fever chills nausea vomiting muscle aches pains calf pain back pain chest pain shortness of breath.  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 Jacqulynn Cadet- L4-L5 fusion    BIOPSY THYROID  2014   COLONOSCOPY W/ POLYPECTOMY     EYE SURGERY     bilateral cataract surgery w/ lens implant   FOOT SURGERY  2006   Right foot , secondary to severe loss of joint (Madeline Young)   TONSILLECTOMY     UPPER GASTROINTESTINAL ENDOSCOPY  2005   With empiric esophageal dilation    Current Outpatient Medications:    aspirin 81 MG tablet, Take 81 mg by mouth daily., Disp: , Rfl:    calcium carbonate (OS-CAL - DOSED IN MG OF ELEMENTAL CALCIUM) 1250 (500 Ca) MG tablet, 1 tablet with meals (Patient not taking: Reported on 05/12/2021), Disp: , Rfl:    calcium-vitamin D (OSCAL WITH D) 250-125 MG-UNIT tablet, Take 1 tablet by mouth daily., Disp: , Rfl:    celecoxib (CELEBREX) 200 MG  capsule, Take 1 capsule (200 mg total) by mouth daily., Disp: 90 capsule, Rfl: 0   cetirizine (ZYRTEC ALLERGY) 10 MG tablet, 1 tablet, Disp: , Rfl:    cholecalciferol (VITAMIN D) 1000 UNITS tablet, Take 1,000 Units by mouth daily., Disp: , Rfl:    clobetasol (TEMOVATE) 0.05 % external solution, APPLY TO AFFECTED AREAS ON SCALP TWICE DAILY UNTIL CLEAR (Patient not taking: Reported on 05/12/2021), Disp: , Rfl:    diclofenac Sodium (VOLTAREN) 1 % GEL, APPLY 2 GRAMS TO AFFECTED AREA 4 TIMES A DAY, Disp: 300 g, Rfl: 3   fluocinonide ointment (LIDEX) 0.05 %, APPLY TWICE DAILY TO AFFECTED AREAS UNTIL IMPROVED THEN AS NEEDED FOR FLARES, Disp: , Rfl:    FLUoxetine (PROZAC) 20 MG capsule, Take 1 capsule (20 mg total) by mouth daily., Disp: 90 capsule, Rfl: 1   ipratropium (ATROVENT) 0.03 % nasal spray, Place into both nostrils., Disp: , Rfl:    Lactobacillus (PROBIOTIC ACIDOPHILUS PO), Take 1 capsule by mouth daily. (Patient not taking: Reported on 05/12/2021), Disp: , Rfl:    lisdexamfetamine (VYVANSE) 60 MG capsule, Take 1 capsule (60 mg total) by mouth every morning., Disp: 30 capsule, Rfl: 0   [START ON 07/13/2021]  lisdexamfetamine (VYVANSE) 60 MG capsule, Take 1 capsule (60 mg total) by mouth daily., Disp: 30 capsule, Rfl: 0   lisdexamfetamine (VYVANSE) 60 MG capsule, Take 1 capsule (60 mg total) by mouth every morning., Disp: 30 capsule, Rfl: 0   MAGNESIUM-ZINC PO, Take 1 tablet by mouth daily., Disp: , Rfl:    Multiple Vitamin (MULTIVITAMIN) tablet, Take 1 tablet by mouth daily., Disp: , Rfl:    nystatin-triamcinolone ointment (MYCOLOG), Apply 1 application topically 2 (two) times daily., Disp: 30 g, Rfl: 1   Omega-3 Fatty Acids (FISH OIL) 1200 MG CAPS, Take 1,200 mg by mouth daily. , Disp: , Rfl:    oxyCODONE-acetaminophen (PERCOCET/ROXICET) 5-325 MG tablet, Take 1-2 tablets by mouth daily as needed for moderate pain., Disp: 45 tablet, Rfl: 0   oxyCODONE-acetaminophen (PERCOCET/ROXICET) 5-325 MG tablet,  Take 1-2 tablets by mouth daily as needed for moderate pain., Disp: 60 tablet, Rfl: 0   oxyCODONE-acetaminophen (PERCOCET/ROXICET) 5-325 MG tablet, Take 1-2 tablets by mouth daily as needed for moderate pain., Disp: 60 tablet, Rfl: 0   [START ON 07/28/2021] oxyCODONE-acetaminophen (PERCOCET/ROXICET) 5-325 MG tablet, Take 1-2 tablets by mouth daily as needed for moderate pain., Disp: 45 tablet, Rfl: 0   oxyCODONE-acetaminophen (PERCOCET/ROXICET) 5-325 MG tablet, Take 1-2 tablets by mouth daily as needed for moderate pain., Disp: 45 tablet, Rfl: 0   oxyCODONE-acetaminophen (PERCOCET/ROXICET) 5-325 MG tablet, Take 1-2 tablets by mouth daily as needed for moderate pain., Disp: 45 tablet, Rfl: 0   Polyethyl Glycol-Propyl Glycol 0.4-0.3 % SOLN, Place 1 drop into both eyes daily as needed (for dry eyes)., Disp: , Rfl:    predniSONE (DELTASONE) 10 MG tablet, 6 tablets on Day 1 , then reduce by 1 tablet daily until gone (Patient not taking: Reported on 05/12/2021), Disp: 21 tablet, Rfl: 0   Probiotic Product (PROBIOTIC ADVANCED PO), Take 1 capsule by mouth daily., Disp: , Rfl:    triamcinolone cream (KENALOG) 0.1 %, APPLY TO AFFECTED AREA TWICE A DAY UNTIL CLEAR THEN AS NEEDED, Disp: , Rfl:    zolpidem (AMBIEN) 5 MG tablet, Take 1 tablet (5 mg total) by mouth at bedtime as needed for sleep. On empty stomach, Disp: 30 tablet, Rfl: 5  Allergies  Allergen Reactions   Cosentyx [Secukinumab] Rash    Severe case of psorasis   Sertraline Hcl Other (See Comments)    Made me crazy    Avelox [Moxifloxacin Hcl In Nacl]     Pt cant remember reaction   Nitrofurantoin     Other reaction(s): Other (See Comments) tired Pt cant remember reaction Other reaction(s): Unknown   Rosuvastatin     Other reaction(s): Unknown   Latex Itching and Rash    Redness   Macrobid [Nitrofurantoin Monohyd Macro] Other (See Comments)    tired   Statins Other (See Comments)    Muscle aches   Review of Systems Objective:  There  were no vitals filed for this visit.  General: Well developed, nourished, in no acute distress, alert and oriented x3   Dermatological: Skin is warm, dry and supple bilateral. Nails x 10 are well maintained; remaining integument appears unremarkable at this time. There are no open sores, no preulcerative lesions, no rash or signs of infection present.  Vascular: Dorsalis Pedis artery and Posterior Tibial artery pedal pulses are 2/4 bilateral with immedate capillary fill time. Pedal hair growth present. No varicosities and no lower extremity edema present bilateral.   Neruologic: Grossly intact via light touch bilateral. Vibratory intact via tuning fork  bilateral. Protective threshold with Semmes Wienstein monofilament intact to all pedal sites bilateral. Patellar and Achilles deep tendon reflexes 2+ bilateral. No Babinski or clonus noted bilateral.   Musculoskeletal: No gross boney pedal deformities bilateral. No pain, crepitus, or limitation noted with foot and ankle range of motion bilateral. Muscular strength 5/5 in all groups tested bilateral.  She demonstrates that early hallux varus with medial deviation of the lesser toes and torsion of the fourth toe right foot.  Left foot demonstrates very early similar findings there is some mild erythema overlying the DIPJ of the third digit left foot.  Gait: Unassisted, Nonantalgic.    Radiographs:  Radiographs taken today demonstrate internal fixation to the second and third metatarsals of the right foot fusion to the lesser toes she also does demonstrate what appears to be a narrowing of the head of the second and third metatarsals much like psoriatic arthritis other than the varus deformity of the right hallux medial deviation of the toes are also noted.  Left foot demonstrates medial deviation of the lesser toes rectus hallux however her third toe at the DIPJ does demonstrate psoriatic distraction at the level of the DIPJ.  Assessment & Plan:    Assessment: Psoriatic arthritis most likely starting to result in medial deviation of the toes bilaterally  Plan: Recommend that she follow-up with rheumatology to see if there is any way this to stop the flares.  Once this has occurred and she is cleared for surgical intervention I would consider fusion of the first metatarsal phalangeal joint of the right foot and a release of the capsule to the third or fourth toe of that right foot.     Madeline Young T. [place], [place]

## 2021-07-19 ENCOUNTER — Ambulatory Visit (INDEPENDENT_AMBULATORY_CARE_PROVIDER_SITE_OTHER): Payer: Medicare HMO

## 2021-07-19 VITALS — Ht 64.0 in | Wt 169.0 lb

## 2021-07-19 DIAGNOSIS — Z0000 Encounter for general adult medical examination without abnormal findings: Secondary | ICD-10-CM

## 2021-07-19 NOTE — Patient Instructions (Addendum)
Madeline Young , Thank you for taking time to come for your Medicare Wellness Visit. I appreciate your ongoing commitment to your health goals. Please review the following plan we discussed and let me know if I can assist you in the future.   These are the goals we discussed:  Goals      Follow up with provider as needed     Increase physical activity with yoga     Healthy diet        This is a list of the screening recommended for you and due dates:  Health Maintenance  Topic Date Due   COVID-19 Vaccine (4 - Booster for Pfizer series) 08/04/2021*   Mammogram  10/08/2021*   Colon Cancer Screening  10/08/2021*   Zoster (Shingles) Vaccine (2 of 2) 10/19/2021*   DEXA scan (bone density measurement)  07/19/2022*   Tetanus Vaccine  08/09/2022   Flu Shot  Completed   Hepatitis C Screening: USPSTF Recommendation to screen - Ages 18-79 yo.  Completed   HPV Vaccine  Aged Out  *Topic was postponed. The date shown is not the original due date.    Advanced directives: on file  Conditions/risks identified: none new  Follow up in one year for your annual wellness visit    Preventive Care 65 Years and Older, Female Preventive care refers to lifestyle choices and visits with your health care provider that can promote health and wellness. What does preventive care include? A yearly physical exam. This is also called an annual well check. Dental exams once or twice a year. Routine eye exams. Ask your health care provider how often you should have your eyes checked. Personal lifestyle choices, including: Daily care of your teeth and gums. Regular physical activity. Eating a healthy diet. Avoiding tobacco and drug use. Limiting alcohol use. Practicing safe sex. Taking low-dose aspirin every day. Taking vitamin and mineral supplements as recommended by your health care provider. What happens during an annual well check? The services and screenings done by your health care provider during  your annual well check will depend on your age, overall health, lifestyle risk factors, and family history of disease. Counseling  Your health care provider may ask you questions about your: Alcohol use. Tobacco use. Drug use. Emotional well-being. Home and relationship well-being. Sexual activity. Eating habits. History of falls. Memory and ability to understand (cognition). Work and work Statistician. Reproductive health. Screening  You may have the following tests or measurements: Height, weight, and BMI. Blood pressure. Lipid and cholesterol levels. These may be checked every 5 years, or more frequently if you are over 72 years old. Skin check. Lung cancer screening. You may have this screening every year starting at age 28 if you have a 30-pack-year history of smoking and currently smoke or have quit within the past 15 years. Fecal occult blood test (FOBT) of the stool. You may have this test every year starting at age 26. Flexible sigmoidoscopy or colonoscopy. You may have a sigmoidoscopy every 5 years or a colonoscopy every 10 years starting at age 64. Hepatitis C blood test. Hepatitis B blood test. Sexually transmitted disease (STD) testing. Diabetes screening. This is done by checking your blood sugar (glucose) after you have not eaten for a while (fasting). You may have this done every 1-3 years. Bone density scan. This is done to screen for osteoporosis. You may have this done starting at age 4. Mammogram. This may be done every 1-2 years. Talk to your health care provider  about how often you should have regular mammograms. Talk with your health care provider about your test results, treatment options, and if necessary, the need for more tests. Vaccines  Your health care provider may recommend certain vaccines, such as: Influenza vaccine. This is recommended every year. Tetanus, diphtheria, and acellular pertussis (Tdap, Td) vaccine. You may need a Td booster every 10  years. Zoster vaccine. You may need this after age 7. Pneumococcal 13-valent conjugate (PCV13) vaccine. One dose is recommended after age 14. Pneumococcal polysaccharide (PPSV23) vaccine. One dose is recommended after age 62. Talk to your health care provider about which screenings and vaccines you need and how often you need them. This information is not intended to replace advice given to you by your health care provider. Make sure you discuss any questions you have with your health care provider. Document Released: 10/22/2015 Document Revised: 06/14/2016 Document Reviewed: 07/27/2015 Elsevier Interactive Patient Education  2017 [place] Prevention in the Home Falls can cause injuries. They can happen to people of all ages. There are many things you can do to make your home safe and to help prevent falls. What can I do on the outside of my home? Regularly fix the edges of walkways and driveways and fix any cracks. Remove anything that might make you trip as you walk through a door, such as a raised step or threshold. Trim any bushes or trees on the path to your home. Use bright outdoor lighting. Clear any walking paths of anything that might make someone trip, such as rocks or tools. Regularly check to see if handrails are loose or broken. Make sure that both sides of any steps have handrails. Any raised decks and porches should have guardrails on the edges. Have any leaves, snow, or ice cleared regularly. Use sand or salt on walking paths during winter. Clean up any spills in your garage right away. This includes oil or grease spills. What can I do in the bathroom? Use night lights. Install grab bars by the toilet and in the tub and shower. Do not use towel bars as grab bars. Use non-skid mats or decals in the tub or shower. If you need to sit down in the shower, use a plastic, non-slip stool. Keep the floor dry. Clean up any water that spills on the floor as soon as it  happens. Remove soap buildup in the tub or shower regularly. Attach bath mats securely with double-sided non-slip rug tape. Do not have throw rugs and other things on the floor that can make you trip. What can I do in the bedroom? Use night lights. Make sure that you have a light by your bed that is easy to reach. Do not use any sheets or blankets that are too big for your bed. They should not hang down onto the floor. Have a firm chair that has side arms. You can use this for support while you get dressed. Do not have throw rugs and other things on the floor that can make you trip. What can I do in the kitchen? Clean up any spills right away. Avoid walking on wet floors. Keep items that you use a lot in easy-to-reach places. If you need to reach something above you, use a strong step stool that has a grab bar. Keep electrical cords out of the way. Do not use floor polish or wax that makes floors slippery. If you must use wax, use non-skid floor wax. Do not have throw rugs and  other things on the floor that can make you trip. What can I do with my stairs? Do not leave any items on the stairs. Make sure that there are handrails on both sides of the stairs and use them. Fix handrails that are broken or loose. Make sure that handrails are as long as the stairways. Check any carpeting to make sure that it is firmly attached to the stairs. Fix any carpet that is loose or worn. Avoid having throw rugs at the top or bottom of the stairs. If you do have throw rugs, attach them to the floor with carpet tape. Make sure that you have a light switch at the top of the stairs and the bottom of the stairs. If you do not have them, ask someone to add them for you. What else can I do to help prevent falls? Wear shoes that: Do not have high heels. Have rubber bottoms. Are comfortable and fit you well. Are closed at the toe. Do not wear sandals. If you use a stepladder: Make sure that it is fully opened.  Do not climb a closed stepladder. Make sure that both sides of the stepladder are locked into place. Ask someone to hold it for you, if possible. Clearly mark and make sure that you can see: Any grab bars or handrails. First and last steps. Where the edge of each step is. Use tools that help you move around (mobility aids) if they are needed. These include: Canes. Walkers. Scooters. Crutches. Turn on the lights when you go into a dark area. Replace any light bulbs as soon as they burn out. Set up your furniture so you have a clear path. Avoid moving your furniture around. If any of your floors are uneven, fix them. If there are any pets around you, be aware of where they are. Review your medicines with your doctor. Some medicines can make you feel dizzy. This can increase your chance of falling. Ask your doctor what other things that you can do to help prevent falls. This information is not intended to replace advice given to you by your health care provider. Make sure you discuss any questions you have with your health care provider. Document Released: 07/22/2009 Document Revised: 03/02/2016 Document Reviewed: 10/30/2014 Elsevier Interactive Patient Education  2017 [place].  Opioid Pain Medicine Management Opioid pain medicines are strong medicines that are used to treat bad or very bad pain. When you take them for a short time, they can help you: Sleep better. Do better in physical therapy. Feel better during the first few days after you get hurt. Recover from surgery. Only take these medicines if a doctor says that you can. You should only take them for a short time. This is because opioids can be very addictive. This means that they are hard to stop taking. The longer you take opioids, the harder it may be to stop taking them. What are the risks? Opioids can cause problems (side effects). Taking them for more than 3 days raises your chance of problems, such as: Trouble pooping  (constipation). Feeling sick to your stomach (nausea). Vomiting. Feeling very sleepy. Confusion. Not being able to stop taking the medicine. Breathing problems. Taking opioids for a long time can make it hard for you to do daily tasks. It can also put you at risk for: Car accidents. Depression. Suicide. Heart attack. Taking too much of the medicine (overdose). This can lead to death. What is a pain treatment plan? A pain treatment  plan is a plan made by you and your doctor. Work with your doctor to make a plan for treating your pain. To help you do this: Talk about the goals of your treatment, including: How much pain you might expect to have. How you will manage the pain. Talk about the risks and benefits of taking these medicines for your condition. Remember that a good treatment plan uses more than one approach and lowers the risks of side effects. Tell your doctor about the amount of medicines you take and about any drug or alcohol use. Get your pain medicine prescriptions from only one doctor. Pain can be managed with other treatments. Work with your doctor to find other ways to help your pain, such as: Physical therapy or doing gentle exercises. Counseling. Eating healthy foods. Massage. Meditation. Other pain medicines. How to use opioid pain medicine safely Taking medicine Take your pain medicine exactly as told by your doctor. Take it only when you need it. If your pain is not too bad, you may take less medicine if your doctor allows. If you have no pain, do not take the medicine unless your doctor tells you to take it. If your pain is very bad, do not take more medicine than your doctor told you to take. Call your doctor to know what to do. Write down the times when you take your pain medicine. Look at the times before you take your next dose. Take other over-the-counter or prescription medicines only as told by your doctor. Keeping yourself and others safe  While you  are taking opioids: Do not drive, use machines, or power tools. Do not sign important papers (legal documents). Do not drink alcohol. Do not take sleeping pills. Do not take care of children by yourself. Do not do activities where you need to climb or be in high places, like working on a ladder. Do not go to a lake, river, ocean, swimming pool, or hot tub. Keep your opioids locked up or in a place where children cannot reach them. Do not share your pain medicine with anyone. Stopping your use of opioids If you have been taking opioids for more than a few weeks, you may need to slowly decrease (taper) how much you take until you stop taking them. Doing this can lower your chance of having symptoms.  Symptoms that come from suddenly stopping the use of opioids include: Pain and cramping in your belly (abdomen). Feeling sick to your stomach (nausea).z Sweating. Feeling very sleepy. Feeling restless. Shaking you cannot control (tremors). Cravings for the medicine. Do not try to stop taking them by yourself. Work with your doctor to stop. Your doctor will help you take less until you are not taking the medicine at all. Getting rid of unused pills Do not save any pills that you did not use. Get rid of the pills by: Taking them to a take-back program in your area. Bringing them to a pharmacy that receives unused pills. Flushing them down the toilet. Check the label or package insert of your medicine to see whether this is safe to do. Throwing them in the trash. Check the label or package insert of your medicine to see whether this is safe to do. If it is safe to throw them out: Take the pills out of their container. Put the pills into a container you can seal. Mix the pills with used coffee grounds, food scraps, dirt, or cat litter. Put this in the trash. Follow these instructions at  home: Activity Do exercises as told by your doctor. Avoid doing things that make your pain worse. Return  to your normal activities as told by your doctor. Ask your doctor what activities are safe for you. General instructions You may need to take these actions to prevent or treat constipation: Drink enough fluid to keep your pee (urine) pale yellow. Take over-the-counter or prescription medicines. Eat foods that are high in fiber. These include beans, whole grains, and fresh fruits and vegetables. Limit foods that are high in fat and sugar. These include fried or sweet foods. Keep all follow-up visits. Where to find support If you have been taking opioids for a long time, get help from a local support group or counselor. Ask your doctor about this. Where to find more information Centers for Disease Control and Prevention (CDC): http://www.wolf.info/ [place] Food and Drug Administration (FDA): [place] Get help right away if: You may have taken too much of an opioid (overdosed). Common symptoms of an overdose: Your breathing is slower or more shallow than normal. You have a very slow heartbeat. Your speech is not normal. You vomit or you feel as if you may vomit. The black centers of your eyes (pupils) are smaller than normal. You have other potential symptoms: You feel very confused. You faint. You are very sleepy. You have cold skin. You have blue lips or fingernails. You have thoughts of harming yourself or harming others. These symptoms may be an emergency. Get help right away. Call your local emergency services (911 in the [place] Do not wait to see if the symptoms will go away. Do not drive yourself to the hospital. Get help right away if you feel like you may hurt yourself or others, or have thoughts about taking your own life. Go to your nearest emergency room or: Call your local emergency services (911 in the [place] Call the [facility] at 315-621-5326. Call a suicide crisis helpline, such as the [facility] at 2524792467. This is open 24  hours a day. Text the Crisis Text Line at 939-178-4763. Summary Opioid are strong medicines that are used to treat bad or very bad pain. A pain treatment plan is a plan made by you and your doctor. Work with your doctor to make a plan for treating your pain. If you think that you or someone else may have taken too much of an opioid, get help right away. This information is not intended to replace advice given to you by your health care provider. Make sure you discuss any questions you have with your health care provider. Document Revised: 01/05/2021 Document Reviewed: 01/05/2021 Elsevier Patient Education  [place].

## 2021-07-19 NOTE — Progress Notes (Signed)
Subjective:   Madeline Young is a 65 y.o. female who presents for Medicare Annual (Subsequent) preventive examination.  Review of Systems    No ROS.  Medicare Wellness Virtual Visit.  Visual/audio telehealth visit, UTA vital signs.   See social history for additional risk factors.       Objective:    Today's Vitals   07/19/21 1034  Weight: 169 lb (76.7 kg)  Height: 5\' 4"  (1.626 m)   Body mass index is 29.01 kg/m.  Advanced Directives 07/19/2021 07/16/2020 07/16/2019 07/10/2018 03/17/2016 02/09/2016 02/10/2015  Does Patient Have a Medical Advance Directive? Yes Yes Yes Yes No No No  Type of Paramedic of [place];Living will [place];Living will [place];Living will [place];Living will - - -  Does patient want to make changes to medical advance directive? No - Patient declined No - Patient declined No - Patient declined No - Patient declined - - -  Copy of [place] in Chart? Yes - validated most recent copy scanned in chart (See row information) Yes - validated most recent copy scanned in chart (See row information) No - copy requested No - copy requested - - -  Would patient like information on creating a medical advance directive? - - - - No - patient declined information Yes - Educational materials given Yes - Educational materials given    Current Medications (verified) Outpatient Encounter Medications as of 07/19/2021  Medication Sig   aspirin 81 MG tablet Take 81 mg by mouth daily.   calcium carbonate (OS-CAL - DOSED IN MG OF ELEMENTAL CALCIUM) 1250 (500 Ca) MG tablet 1 tablet with meals (Patient not taking: Reported on 05/12/2021)   calcium-vitamin D (OSCAL WITH D) 250-125 MG-UNIT tablet Take 1 tablet by mouth daily.   celecoxib (CELEBREX) 200 MG capsule Take 1 capsule (200 mg total) by mouth daily.   cetirizine (ZYRTEC ALLERGY) 10 MG tablet 1 tablet   cholecalciferol  (VITAMIN D) 1000 UNITS tablet Take 1,000 Units by mouth daily.   clobetasol (TEMOVATE) 0.05 % external solution APPLY TO AFFECTED AREAS ON SCALP TWICE DAILY UNTIL CLEAR (Patient not taking: Reported on 05/12/2021)   diclofenac Sodium (VOLTAREN) 1 % GEL APPLY 2 GRAMS TO AFFECTED AREA 4 TIMES A DAY   fluocinonide ointment (LIDEX) 0.05 % APPLY TWICE DAILY TO AFFECTED AREAS UNTIL IMPROVED THEN AS NEEDED FOR FLARES   FLUoxetine (PROZAC) 20 MG capsule Take 1 capsule (20 mg total) by mouth daily.   ipratropium (ATROVENT) 0.03 % nasal spray Place into both nostrils.   Lactobacillus (PROBIOTIC ACIDOPHILUS PO) Take 1 capsule by mouth daily. (Patient not taking: Reported on 05/12/2021)   lisdexamfetamine (VYVANSE) 60 MG capsule Take 1 capsule (60 mg total) by mouth every morning.   lisdexamfetamine (VYVANSE) 60 MG capsule Take 1 capsule (60 mg total) by mouth daily.   lisdexamfetamine (VYVANSE) 60 MG capsule Take 1 capsule (60 mg total) by mouth every morning.   MAGNESIUM-ZINC PO Take 1 tablet by mouth daily.   Multiple Vitamin (MULTIVITAMIN) tablet Take 1 tablet by mouth daily.   nystatin-triamcinolone ointment (MYCOLOG) Apply 1 application topically 2 (two) times daily.   Omega-3 Fatty Acids (FISH OIL) 1200 MG CAPS Take 1,200 mg by mouth daily.    oxyCODONE-acetaminophen (PERCOCET/ROXICET) 5-325 MG tablet Take 1-2 tablets by mouth daily as needed for moderate pain.   oxyCODONE-acetaminophen (PERCOCET/ROXICET) 5-325 MG tablet Take 1-2 tablets by mouth daily as needed for moderate pain.  oxyCODONE-acetaminophen (PERCOCET/ROXICET) 5-325 MG tablet Take 1-2 tablets by mouth daily as needed for moderate pain.   [START ON 07/28/2021] oxyCODONE-acetaminophen (PERCOCET/ROXICET) 5-325 MG tablet Take 1-2 tablets by mouth daily as needed for moderate pain.   oxyCODONE-acetaminophen (PERCOCET/ROXICET) 5-325 MG tablet Take 1-2 tablets by mouth daily as needed for moderate pain.   oxyCODONE-acetaminophen (PERCOCET/ROXICET)  5-325 MG tablet Take 1-2 tablets by mouth daily as needed for moderate pain.   Polyethyl Glycol-Propyl Glycol 0.4-0.3 % SOLN Place 1 drop into both eyes daily as needed (for dry eyes).   predniSONE (DELTASONE) 10 MG tablet 6 tablets on Day 1 , then reduce by 1 tablet daily until gone (Patient not taking: Reported on 05/12/2021)   Probiotic Product (PROBIOTIC ADVANCED PO) Take 1 capsule by mouth daily.   triamcinolone cream (KENALOG) 0.1 % APPLY TO AFFECTED AREA TWICE A DAY UNTIL CLEAR THEN AS NEEDED   zolpidem (AMBIEN) 5 MG tablet Take 1 tablet (5 mg total) by mouth at bedtime as needed for sleep. On empty stomach   No facility-administered encounter medications on file as of 07/19/2021.    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 Jacqulynn Cadet- L4-L5 fusion    BIOPSY THYROID  2014   COLONOSCOPY W/ POLYPECTOMY     EYE SURGERY     bilateral cataract surgery w/ lens implant   FOOT SURGERY  2006   Right foot , secondary to severe loss of joint ([place])   TONSILLECTOMY     UPPER GASTROINTESTINAL ENDOSCOPY  2005   With empiric esophageal dilation   Family History  Problem Relation Age of Onset   Heart disease Mother     Hyperlipidemia Mother    Coronary artery disease Father 74       CABG in early 77's   Hyperlipidemia Father    Epilepsy Grandchild        Severe form   Colon cancer Neg Hx    Esophageal cancer Neg Hx    Rectal cancer Neg Hx    Stomach cancer Neg Hx    Social History   Socioeconomic History   Marital status: Married    Spouse name: Not on file   Number of children: 2   Years of education: Not on file   Highest education level: Not on file  Occupational History   Occupation: disability  Tobacco Use   Smoking status: Former    Types: Cigarettes    Quit date: 08/21/1971    Years since quitting: 49.9   Smokeless tobacco: Never  Vaping Use   Vaping Use: Never used  Substance and Sexual Activity   Alcohol use: Yes    Alcohol/week:  7.0 standard drinks    Types: 7 Glasses of wine per week   Drug use: No   Sexual activity: Not on file  Other Topics Concern   Not on file  Social History Narrative   Married, retired Therapist, sports   2 daughters some grandchildren   1 caffeinated drinks daily   1 alcoholic beverage daily   No tobacco      Social Determinants of Radio broadcast assistant Strain: Low Risk    Difficulty of Paying Living Expenses: Not hard at all  Food Insecurity: No Food Insecurity   Worried About Charity fundraiser in the Last Year: Never true   Arboriculturist in the Last Year: Never true  Transportation Needs: No Transportation Needs   Lack of Transportation (Medical): No   Lack of Transportation (Non-Medical): No  Physical Activity: Insufficiently Active   Days of Exercise per Week: 2 days   Minutes of Exercise per Session: 50 min  Stress: No Stress Concern Present   Feeling of Stress : Not at all  Social Connections: Unknown   Frequency of Communication with Friends and Family: Not on file   Frequency of Social Gatherings with Friends and Family: Not on file   Attends Religious Services: Not on Electrical engineer or Organizations: Not on file    Attends Archivist Meetings: Not on file   Marital Status: Married    Tobacco Counseling Counseling given: Not Answered   Clinical Intake:  Pre-visit preparation completed: Yes        Diabetes: No  How often do you need to have someone help you when you read instructions, pamphlets, or other written materials from your doctor or pharmacy?: 1 - Never    Interpreter Needed?: No      Activities of Daily Living In your present state of health, do you have any difficulty performing the following activities: 07/19/2021  Hearing? (No Data)  Comment Followed by [facility]  Vision? N  Difficulty concentrating or making decisions? N  Walking or climbing stairs? N  Dressing or bathing? N  Doing errands, shopping? N  Preparing Food and eating ? N  Using the Toilet? N  In the past six months, have you accidently leaked urine? N  Do you have problems with loss of bowel control? N  Managing your Medications? N  Managing your Finances? N  Housekeeping or managing your Housekeeping? N  Some recent data might be hidden    Patient Care Team: Crecencio Mc, MD as PCP - General (Internal Medicine)  Indicate any recent Medical Services you may have received from other than [facility] providers in the past year (date may be approximate).     Assessment:   This is a routine wellness examination for Danasia.  I connected with Kala today by telephone and verified that I am speaking with the correct person using two identifiers. Location patient: home Location provider: work Persons participating in the virtual visit: patient, Marine scientist.    I discussed the limitations, risks, security and privacy concerns of performing an evaluation and management service by telephone and the availability of in person appointments. The patient expressed understanding and verbally consented to this telephonic visit.    Interactive audio and video telecommunications were attempted between  this provider and patient, however failed, due to patient having technical difficulties OR patient did not have access to video capability.  We continued and completed visit with audio only.  Some  vital signs may be absent or patient reported.   Hearing/Vision screen Hearing Screening - Comments:: Some difficulty hearing conversational tones.   Followed by [facility].  Vision Screening - Comments:: Followed by [facility]  Wears corrective lenses  Cataract extraction, bilateral   Dietary issues and exercise activities discussed: Current Exercise Habits: Home exercise routine, Type of exercise: walking Healthy diet Good water intake   Goals Addressed             This Visit's Progress    Follow up with provider as needed       Increase physical activity with yoga     Healthy diet   On track      Depression Screen PHQ 2/9 Scores 07/19/2021 05/12/2021 02/09/2021 11/12/2020 08/09/2020 07/16/2020 05/10/2020  PHQ - 2 Score 0 2 1 0 0 0 0  PHQ- 9 Score - 10 4 0 0 - 1    Fall Risk Fall Risk  07/19/2021 02/09/2021 11/12/2020 08/09/2020 07/16/2020  Falls in the past year? 0 1 0 0 0  Number falls in past yr: - 1 0 0 0  Injury with Fall? - 0 0 0 -  Follow up Falls evaluation completed Falls evaluation completed Falls evaluation completed Falls evaluation completed Falls evaluation completed    FALL RISK PREVENTION PERTAINING TO THE HOME: Adequate lighting in your home to reduce risk of falls? Yes   ASSISTIVE DEVICES UTILIZED TO PREVENT FALLS: Life alert? No  Use of a cane, walker or w/c? No   TIMED UP AND GO: Was the test performed? No .   Cognitive Function: Patient is alert and oriented x3.  Maintains activities daily living, operates a motor vehicle without difficulty, does her own finances and keeps up with medications and appointments.  MMSE - Mini Mental State Exam 07/10/2018  Orientation to time 5  Orientation to Place 5  Registration 3  Attention/ Calculation 5  Recall 3   Language- name 2 objects 2  Language- repeat 1  Language- follow 3 step command 3  Language- read & follow direction 1  Write a sentence 1  Copy design 1  Total score 30     6CIT Screen 07/16/2019  What Year? 0 points  What month? 0 points  What time? 0 points  Count back from 20 0 points  Months in reverse 0 points  Repeat phrase 0 points  Total Score 0    Immunizations Immunization History  Administered Date(s) Administered   Fluad Quad(high Dose 65+) 08/09/2020   Influenza Split 08/21/2011, 08/09/2012   Influenza, High Dose Seasonal PF 07/10/2018, 06/09/2019, 07/05/2021   Influenza-Unspecified 07/10/2016, 09/14/2017   PFIZER(Purple Top)SARS-COV-2 Vaccination 11/01/2019, 11/23/2019, 07/10/2020   Pneumococcal Conjugate-13 07/17/2014   Pneumococcal Polysaccharide-23 08/21/2011, 07/27/2016   Tdap 08/09/2012   Zoster Recombinat (Shingrix) 06/09/2019   Zoster, Live 09/09/2016   Health Maintenance Health Maintenance  Topic Date Due   COVID-19 Vaccine (4 - Booster for [place] series) 08/04/2021 (Originally 10/02/2020)   MAMMOGRAM  10/08/2021 (Originally 05/27/2021)   COLONOSCOPY (Pts 45-17yrs Insurance coverage will need to be confirmed)  10/08/2021 (Originally 05/03/2020)   Zoster Vaccines- Shingrix (2 of 2) 10/19/2021 (Originally 08/04/2019)   DEXA SCAN  07/19/2022 (Originally 08/16/2019)   TETANUS/TDAP  08/09/2022   INFLUENZA VACCINE  Completed   Hepatitis C Screening  Completed   HPV VACCINES  Aged Out   Colonoscopy- deferred for follow up at a later date.   Shingrix vaccine- second dose due. Patient notes completing and will update  immunization record. Education has been provided regarding the importance of this vaccine. Advised may receive this vaccine at local pharmacy or Health Dept. Aware to provide a copy of the vaccination record if obtained from local pharmacy or Health Dept. Verbalized acceptance and understanding. Deferred.   Mammogram- plans to schedule. Notes  received letter from facility to schedule.   Bone density- deferred.   Lung Cancer Screening: (Low Dose CT Chest recommended if Age 67-80 years, 30 pack-year currently smoking OR have quit w/in 15years.) does not qualify.   Vision Screening: Recommended annual ophthalmology exams for early detection of glaucoma and other disorders of the eye.  Dental Screening: Recommended annual dental exams for proper oral hygiene.  Community Resource Referral / Chronic Care Management: CRR required this visit?  No   CCM required this visit?  No      Plan:   Keep all routine maintenance appointments.   I have personally reviewed and noted the following in the patient's chart:   Medical and social history Use of alcohol, tobacco or illicit drugs  Current medications and supplements including opioid prescriptions. Taking opioid. Followed and managed by pcp.  Functional ability and status Nutritional status Physical activity Advanced directives List of other physicians Hospitalizations, surgeries, and ER visits in previous 12 months Vitals Screenings to include cognitive, depression, and falls Referrals and appointments  In addition, I have reviewed and discussed with patient certain preventive protocols, quality metrics, and best practice recommendations. A written personalized care plan for preventive services as well as general preventive health recommendations were provided to patient via mychart.     Varney Biles, LPN   15/40/0867

## 2021-07-21 ENCOUNTER — Other Ambulatory Visit: Payer: Self-pay | Admitting: Psychiatry

## 2021-07-21 ENCOUNTER — Other Ambulatory Visit: Payer: Self-pay | Admitting: Internal Medicine

## 2021-07-21 DIAGNOSIS — F3342 Major depressive disorder, recurrent, in full remission: Secondary | ICD-10-CM

## 2021-07-22 NOTE — Telephone Encounter (Signed)
90 day ok?

## 2021-08-01 ENCOUNTER — Ambulatory Visit: Payer: Medicare HMO | Admitting: Podiatry

## 2021-08-01 ENCOUNTER — Encounter: Payer: Self-pay | Admitting: Podiatry

## 2021-08-01 ENCOUNTER — Other Ambulatory Visit: Payer: Self-pay

## 2021-08-01 DIAGNOSIS — L603 Nail dystrophy: Secondary | ICD-10-CM | POA: Diagnosis not present

## 2021-08-01 MED ORDER — TERBINAFINE HCL 250 MG PO TABS: 250.0000 mg | ORAL_TABLET | Freq: Every day | ORAL | 0 refills | Status: DC

## 2021-08-01 NOTE — Progress Notes (Signed)
She presents today for follow-up of her nail pathology.  States that is unchanged.  Objective: Vital signs are stable alert oriented x3 there is no erythema edema/drainage noted no change in physical exam.  Pathology does demonstrate saprophytic fungus 2 different types.  Assessment: Pain in limb secondary to saprophytic fungus and onychomycosis.  Plan: After discussion of oral therapy laser therapy and topical therapy should like to try the oral therapy at this point I am consisting of Lamisil 250 mg tablets.  She will take 1 tablet once daily for the next 30 days and then have another set of blood work done.  Should this blood work come back abnormal we will notify her immediately otherwise I will follow-up with her for another 90 d at that time.

## 2021-08-15 ENCOUNTER — Other Ambulatory Visit: Payer: Self-pay | Admitting: Psychiatry

## 2021-08-15 DIAGNOSIS — F900 Attention-deficit hyperactivity disorder, predominantly inattentive type: Secondary | ICD-10-CM

## 2021-08-15 MED ORDER — LISDEXAMFETAMINE DIMESYLATE 60 MG PO CAPS: 60.0000 mg | ORAL_CAPSULE | ORAL | 0 refills | Status: DC

## 2021-08-15 NOTE — Telephone Encounter (Signed)
Next visit is 09/07/21. Requesting refill on Vyvanse 60 mg called to:  [facility], [address], [place], [place] [address]. Phone number is [phone].

## 2021-08-17 ENCOUNTER — Ambulatory Visit (INDEPENDENT_AMBULATORY_CARE_PROVIDER_SITE_OTHER): Payer: Medicare HMO | Admitting: Internal Medicine

## 2021-08-17 ENCOUNTER — Encounter: Payer: Self-pay | Admitting: Internal Medicine

## 2021-08-17 ENCOUNTER — Other Ambulatory Visit: Payer: Self-pay

## 2021-08-17 DIAGNOSIS — E7849 Other hyperlipidemia: Secondary | ICD-10-CM

## 2021-08-17 DIAGNOSIS — M4316 Spondylolisthesis, lumbar region: Secondary | ICD-10-CM | POA: Diagnosis not present

## 2021-08-17 DIAGNOSIS — F325 Major depressive disorder, single episode, in full remission: Secondary | ICD-10-CM | POA: Diagnosis not present

## 2021-08-17 DIAGNOSIS — L4050 Arthropathic psoriasis, unspecified: Secondary | ICD-10-CM | POA: Diagnosis not present

## 2021-08-17 DIAGNOSIS — F5105 Insomnia due to other mental disorder: Secondary | ICD-10-CM | POA: Diagnosis not present

## 2021-08-17 DIAGNOSIS — L4052 Psoriatic arthritis mutilans: Secondary | ICD-10-CM | POA: Diagnosis not present

## 2021-08-17 DIAGNOSIS — R030 Elevated blood-pressure reading, without diagnosis of hypertension: Secondary | ICD-10-CM | POA: Insufficient documentation

## 2021-08-17 DIAGNOSIS — R69 Illness, unspecified: Secondary | ICD-10-CM | POA: Diagnosis not present

## 2021-08-17 DIAGNOSIS — B351 Tinea unguium: Secondary | ICD-10-CM | POA: Diagnosis not present

## 2021-08-17 MED ORDER — ZOLPIDEM TARTRATE 5 MG PO TABS: 5.0000 mg | ORAL_TABLET | Freq: Every evening | ORAL | 5 refills | Status: DC | PRN

## 2021-08-17 MED ORDER — OXYCODONE-ACETAMINOPHEN 5-325 MG PO TABS: 1.0000 | ORAL_TABLET | Freq: Every day | ORAL | 0 refills | Status: DC | PRN

## 2021-08-17 MED ORDER — OXYCODONE-ACETAMINOPHEN 5-325 MG PO TABS
1.0000 | ORAL_TABLET | Freq: Every day | ORAL | 0 refills | Status: DC | PRN
Start: 2021-08-17 — End: 2021-09-07

## 2021-08-17 NOTE — Assessment & Plan Note (Signed)
She has no history of hypertension but has an  elevated reading today In the office which she attributes to uncontrolled pain and aggravation .  She has been asked to check her pressures at home and submit readings for evaluation. Renal function will be checked next week by rheumatology

## 2021-08-17 NOTE — Assessment & Plan Note (Signed)
With persistent low back pain and right sided sciatica despite prior surgery.  Her pain is not relieved with NSAIDs or recent ESI. .   Management of her pain requires  refill on narcotics for twice daily  use . Marland Kitchen Refill history confirmed via [place] Controlled Substance database, accessed by me today..  Nov Dec and January refills sent to pharmacy

## 2021-08-17 NOTE — Assessment & Plan Note (Signed)
Currently managed with prednisone taper and celebrex .  Advised to stop the celebrex given  elevated blood pressure

## 2021-08-17 NOTE — Assessment & Plan Note (Signed)
Symptoms generally improved with management of pain and use of Prozac

## 2021-08-17 NOTE — Patient Instructions (Addendum)
Check  BP at home  3 times over the next 3 months and send me readings   You can try decreasing your prednisone to 5 mg daily to see if your attention improves.    For your toenail fungus  Try treating your toenail fungus naturally with vinegar or tea tree oil dabbed on the top of the nail each day  after giving the nail a few strokes with a disposable emery board .     If you are willing to try Zetia, you may tolerate it better than the statins that you did not tolerate previously.  It works by inhibiting the absorption of cholesterol by the small intestine, so it lowers LDL .  If you are willing to try it I will send it to your pharmacy for a 3 month trial.

## 2021-08-17 NOTE — Assessment & Plan Note (Signed)
Currently suboptimally managed with prednisone 10 mg daily and celebrex by Dr Dossie Der,  Awaiting new rheumatology appt in Dec

## 2021-08-17 NOTE — Progress Notes (Signed)
Subjective:  Patient ID: Madeline Young, female    DOB: [birth year]  Age: 65 y.o. 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  HPI Madeline Young presents for FOLLOW UP  ON MULTIPLE ISSUES.  This visit occurred during the SARS-CoV-2 public health emergency.  Safety protocols were in place, including screening questions prior to the visit, additional usage of staff PPE, and extensive cleaning of exam room while observing appropriate contact time as indicated for disinfecting solutions.   1) Chronic pain: worse lately, secondary to psoriatic arthritis, and sciatica. Not on a DMARD currently,  using prednisone  Managed with oxycodone.  Pulled a groin muscle on the left side.  Has been hurting for weeks   2) Depression with insomnia :;  using ambien 5 mg daily and prozac .  Symptoms improving.    3) ADD: managed by [facility] with Vyvanse 60 mg daily .  4)  HTN; with elevated BPs in office today.    Does not check at home.  Still taking daily prednisone , plans to be on it until Dec 1 appt with new rheumatologist  ,.  Taking 10,   mg daily from Dr Dossie Der .  Feels it is interacting with her ADD medication ,  feels scatter brained and forgetful.  Planning a bathroom renovation   5) not taking lamisil  prescribed by podiatry for a mild case of  toenail fungus (by biopsy)   6) COVID BOOSTER PLANNED   Outpatient Medications Prior to Visit  Medication Sig Dispense Refill   aspirin 81 MG tablet Take 81 mg by mouth daily.     calcium carbonate (OS-CAL - DOSED IN MG OF ELEMENTAL CALCIUM) 1250 (500 Ca) MG tablet      calcium-vitamin D (OSCAL WITH D) 250-125 MG-UNIT  tablet Take 1 tablet by mouth daily.     celecoxib (CELEBREX) 200 MG capsule Take 1 capsule by mouth once daily 90 capsule 0   cholecalciferol (VITAMIN D) 1000 UNITS tablet Take 1,000 Units by mouth daily.     clobetasol (TEMOVATE) 0.05 % external solution      diclofenac Sodium (VOLTAREN) 1 % GEL APPLY 2 GRAMS TO AFFECTED AREA 4 TIMES A DAY 300 g 3   fluocinonide ointment (LIDEX) 0.05 % APPLY TWICE DAILY TO AFFECTED AREAS UNTIL IMPROVED THEN AS NEEDED FOR FLARES     FLUoxetine (PROZAC) 20 MG capsule TAKE 1 CAPSULE BY MOUTH EVERY DAY 90 capsule 0   ipratropium (ATROVENT) 0.03 % nasal spray Place into both nostrils.     Lactobacillus (PROBIOTIC ACIDOPHILUS PO) Take 1 capsule by mouth daily.     lisdexamfetamine (VYVANSE) 60 MG capsule Take 1 capsule (60 mg total) by mouth daily. 30 capsule 0   lisdexamfetamine (VYVANSE) 60 MG capsule Take 1 capsule (60 mg total) by mouth every morning. 30 capsule 0   MAGNESIUM-ZINC PO Take 1 tablet by mouth daily.     Multiple Vitamin (MULTIVITAMIN) tablet Take 1 tablet by mouth daily.     nystatin-triamcinolone ointment (MYCOLOG) Apply 1 application topically 2 (two) times daily. 30 g 1   Omega-3 Fatty Acids (FISH OIL) 1200 MG CAPS Take 1,200 mg by mouth daily.      Polyethyl Glycol-Propyl Glycol 0.4-0.3 % SOLN Place 1 drop into  both eyes daily as needed (for dry eyes).     Probiotic Product (PROBIOTIC ADVANCED PO) Take 1 capsule by mouth daily.     terbinafine (LAMISIL) 250 MG tablet Take 1 tablet (250 mg total) by mouth daily. 30 tablet 0   triamcinolone cream (KENALOG) 0.1 % APPLY TO AFFECTED AREA TWICE A DAY UNTIL CLEAR THEN AS NEEDED     lisdexamfetamine (VYVANSE) 60 MG capsule Take 1 capsule (60 mg total) by mouth every morning. 30 capsule 0   oxyCODONE-acetaminophen (PERCOCET/ROXICET) 5-325 MG tablet Take 1-2 tablets by mouth daily as needed for moderate pain. 45 tablet 0   oxyCODONE-acetaminophen (PERCOCET/ROXICET) 5-325 MG tablet Take 1-2 tablets by  mouth daily as needed for moderate pain. 60 tablet 0   oxyCODONE-acetaminophen (PERCOCET/ROXICET) 5-325 MG tablet Take 1-2 tablets by mouth daily as needed for moderate pain. 60 tablet 0   oxyCODONE-acetaminophen (PERCOCET/ROXICET) 5-325 MG tablet Take 1-2 tablets by mouth daily as needed for moderate pain. 45 tablet 0   oxyCODONE-acetaminophen (PERCOCET/ROXICET) 5-325 MG tablet Take 1-2 tablets by mouth daily as needed for moderate pain. 45 tablet 0   oxyCODONE-acetaminophen (PERCOCET/ROXICET) 5-325 MG tablet Take 1-2 tablets by mouth daily as needed for moderate pain. 45 tablet 0   predniSONE (DELTASONE) 10 MG tablet 6 tablets on Day 1 , then reduce by 1 tablet daily until gone 21 tablet 0   zolpidem (AMBIEN) 5 MG tablet Take 1 tablet (5 mg total) by mouth at bedtime as needed for sleep. On empty stomach 30 tablet 5   cetirizine (ZYRTEC) 10 MG tablet 1 tablet (Patient not taking: Reported on 08/17/2021)     No facility-administered medications prior to visit.    Review of Systems;  Patient denies headache, fevers, malaise, unintentional weight loss, skin rash, eye pain, sinus congestion and sinus pain, sore throat, dysphagia,  hemoptysis , cough, dyspnea, wheezing, chest pain, palpitations, orthopnea, edema, abdominal pain, nausea, melena, diarrhea, constipation, flank pain, dysuria, hematuria, urinary  Frequency, nocturia, numbness, tingling, seizures,  Focal weakness, Loss of consciousness,  Tremor, insomnia, depression, anxiety, and suicidal ideation.      Objective:  BP (!) 156/62 (BP Location: Left Arm, Patient Position: Sitting, Cuff Size: Normal)   Pulse 95   Temp (!) 96.4 F (35.8 C) (Temporal)   Ht 5\' 4"  (1.626 m)   Wt 170 lb 9.6 oz (77.4 kg)   SpO2 99%   BMI 29.28 kg/m   BP Readings from Last 3 Encounters:  08/17/21 (!) 156/62  05/12/21 (!) 112/58  02/09/21 110/64    Wt Readings from Last 3 Encounters:  08/17/21 170 lb 9.6 oz (77.4 kg)  07/19/21 169 lb (76.7 kg)   05/12/21 169 lb 3.2 oz (76.7 kg)    General appearance: alert, cooperative and appears stated age Ears: normal TM's and external ear canals both ears Throat: lips, mucosa, and tongue normal; teeth and gums normal Neck: no adenopathy, no carotid bruit, supple, symmetrical, trachea midline and thyroid not enlarged, symmetric, no tenderness/mass/nodules Back: symmetric, no curvature. ROM normal. No CVA tenderness. Lungs: clear to auscultation bilaterally Heart: regular rate and rhythm, S1, S2 normal, no murmur, click, rub or gallop Abdomen: soft, non-tender; bowel sounds normal; no masses,  no organomegaly Pulses: 2+ and symmetric Skin: Skin color, texture, turgor normal. No rashes or lesions Lymph nodes: Cervical, supraclavicular, and axillary nodes normal.  No results found for: HGBA1C  Lab Results  Component Value Date   CREATININE 0.72 02/09/2021   CREATININE 0.68 08/09/2020   CREATININE  0.72 02/06/2020    Lab Results  Component Value Date   WBC 4.6 08/07/2019   HGB 13.0 08/07/2019   HCT 38.3 08/07/2019   PLT 238.0 08/07/2019   GLUCOSE 85 02/09/2021   CHOL 389 (H) 02/09/2021   TRIG 118.0 02/09/2021   HDL 66.50 02/09/2021   LDLDIRECT 252.3 06/12/2012   LDLCALC 299 (H) 02/09/2021   ALT 14 02/09/2021   AST 16 02/09/2021   NA 139 02/09/2021   K 4.3 02/09/2021   CL 102 02/09/2021   CREATININE 0.72 02/09/2021   BUN 31 (H) 02/09/2021   CO2 31 02/09/2021   TSH 2.89 08/09/2020    MM 3D SCREEN BREAST BILATERAL  Result Date: 05/28/2020 CLINICAL DATA:  Screening. EXAM: DIGITAL SCREENING BILATERAL MAMMOGRAM WITH TOMO AND CAD COMPARISON:  Previous exam(s). ACR Breast Density Category b: There are scattered areas of fibroglandular density. FINDINGS: There are no findings suspicious for malignancy. Images were processed with CAD. IMPRESSION: No mammographic evidence of malignancy. A result letter of this screening mammogram will be mailed directly to the patient. RECOMMENDATION:  Screening mammogram in one year. (Code:SM-B-01Y) BI-RADS CATEGORY  1: Negative. Electronically Signed   By: Lillia Mountain M.D.   On: 05/28/2020 14:58    Assessment & Plan:   Problem List Items Addressed This Visit     Familial hyperlipidemia    Reviewed previous trials of statins and repatha as well as her 2019 Cardiac cT which had a calcim score of zero.  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  Advised to stop the celebrex given  elevated blood pressure       Relevant Medications   oxyCODONE-acetaminophen (PERCOCET/ROXICET) 5-325 MG tablet   oxyCODONE-acetaminophen (PERCOCET/ROXICET) 5-325 MG tablet   oxyCODONE-acetaminophen (PERCOCET/ROXICET) 5-325 MG tablet   Spondylolisthesis of lumbar region    With persistent low back pain and right sided sciatica despite prior surgery.  Her pain is not relieved with NSAIDs or recent ESI. .   Management of her pain requires  refill on narcotics for twice daily  use . Marland Kitchen Refill history confirmed via [place] Controlled Substance database, accessed by me today..  Nov Dec and January refills sent to pharmacy       Psoriatic arthritis [facility])    Currently suboptimally managed with prednisone 10 mg daily and celebrex by Dr Dossie Der,  Awaiting new rheumatology appt in Dec       Relevant Medications   oxyCODONE-acetaminophen (PERCOCET/ROXICET) 5-325 MG tablet   oxyCODONE-acetaminophen (PERCOCET/ROXICET) 5-325 MG tablet   oxyCODONE-acetaminophen (PERCOCET/ROXICET) 5-325 MG tablet   Onychomycosis   Elevated blood-pressure reading, without diagnosis of hypertension    She has no history of hypertension but has an  elevated reading today In the office which she attributes to uncontrolled pain and aggravation .  She has been asked  to check her pressures at home and submit readings for evaluation. Renal function will be checked next week by rheumatology      Other Visit Diagnoses     Insomnia due to mental condition       Relevant Medications   zolpidem (AMBIEN) 5 MG tablet       I have discontinued Christl D. Gavidia "[name]"'s predniSONE. I am also having her maintain her  aspirin, cholecalciferol, Fish Oil, Polyethyl Glycol-Propyl Glycol, calcium-vitamin D, multivitamin, fluocinonide ointment, triamcinolone cream, clobetasol, diclofenac Sodium, Lactobacillus (PROBIOTIC ACIDOPHILUS PO), nystatin-triamcinolone ointment, ipratropium, calcium carbonate, Probiotic Product (PROBIOTIC ADVANCED PO), MAGNESIUM-ZINC PO, lisdexamfetamine, cetirizine, celecoxib, FLUoxetine, terbinafine, lisdexamfetamine, oxyCODONE-acetaminophen, oxyCODONE-acetaminophen, oxyCODONE-acetaminophen, and zolpidem.  Meds ordered this encounter  Medications   oxyCODONE-acetaminophen (PERCOCET/ROXICET) 5-325 MG tablet    Sig: Take 1-2 tablets by mouth daily as needed for moderate pain.    Dispense:  45 tablet    Refill:  0    Do not refill less than 30 days from prior refill   oxyCODONE-acetaminophen (PERCOCET/ROXICET) 5-325 MG tablet    Sig: Take 1-2 tablets by mouth daily as needed for moderate pain.    Dispense:  45 tablet    Refill:  0    Do not refill less than 30 days from prior refill   oxyCODONE-acetaminophen (PERCOCET/ROXICET) 5-325 MG tablet    Sig: Take 1-2 tablets by mouth daily as needed for moderate pain.    Dispense:  60 tablet    Refill:  0    Do not refill less than 30 days from prior refill   zolpidem (AMBIEN) 5 MG tablet    Sig: Take 1 tablet (5 mg total) by mouth at bedtime as needed for sleep. On empty stomach    Dispense:  30 tablet    Refill:  5    Medications Discontinued During This Encounter  Medication Reason   oxyCODONE-acetaminophen (PERCOCET/ROXICET) 5-325 MG tablet    oxyCODONE-acetaminophen  (PERCOCET/ROXICET) 5-325 MG tablet    oxyCODONE-acetaminophen (PERCOCET/ROXICET) 5-325 MG tablet    lisdexamfetamine (VYVANSE) 60 MG capsule    oxyCODONE-acetaminophen (PERCOCET/ROXICET) 5-325 MG tablet    oxyCODONE-acetaminophen (PERCOCET/ROXICET) 5-325 MG tablet    predniSONE (DELTASONE) 10 MG tablet    oxyCODONE-acetaminophen (PERCOCET/ROXICET) 5-325 MG tablet Reorder   zolpidem (AMBIEN) 5 MG tablet Reorder    Follow-up: Return in about 3 months (around 11/17/2021).   Crecencio Mc, MD

## 2021-08-17 NOTE — Assessment & Plan Note (Signed)
Reviewed previous trials of statins and repatha as well as her 2019 Cardiac cT which had a calcim score of zero.  She is considering Zetia trial

## 2021-08-22 ENCOUNTER — Other Ambulatory Visit: Payer: Self-pay | Admitting: Internal Medicine

## 2021-08-22 DIAGNOSIS — Z1231 Encounter for screening mammogram for malignant neoplasm of breast: Secondary | ICD-10-CM

## 2021-08-24 DIAGNOSIS — Z9841 Cataract extraction status, right eye: Secondary | ICD-10-CM | POA: Diagnosis not present

## 2021-08-24 DIAGNOSIS — H1045 Other chronic allergic conjunctivitis: Secondary | ICD-10-CM | POA: Diagnosis not present

## 2021-08-24 DIAGNOSIS — H5213 Myopia, bilateral: Secondary | ICD-10-CM | POA: Diagnosis not present

## 2021-08-24 DIAGNOSIS — H0288B Meibomian gland dysfunction left eye, upper and lower eyelids: Secondary | ICD-10-CM | POA: Diagnosis not present

## 2021-08-24 DIAGNOSIS — H52223 Regular astigmatism, bilateral: Secondary | ICD-10-CM | POA: Diagnosis not present

## 2021-08-24 DIAGNOSIS — H0288A Meibomian gland dysfunction right eye, upper and lower eyelids: Secondary | ICD-10-CM | POA: Diagnosis not present

## 2021-08-24 DIAGNOSIS — Z9842 Cataract extraction status, left eye: Secondary | ICD-10-CM | POA: Diagnosis not present

## 2021-09-07 ENCOUNTER — Encounter: Payer: Self-pay | Admitting: Psychiatry

## 2021-09-07 ENCOUNTER — Other Ambulatory Visit: Payer: Self-pay

## 2021-09-07 ENCOUNTER — Ambulatory Visit: Payer: Medicare HMO | Admitting: Psychiatry

## 2021-09-07 VITALS — BP 127/72 | HR 87

## 2021-09-07 DIAGNOSIS — F3342 Major depressive disorder, recurrent, in full remission: Secondary | ICD-10-CM | POA: Diagnosis not present

## 2021-09-07 DIAGNOSIS — F5105 Insomnia due to other mental disorder: Secondary | ICD-10-CM | POA: Diagnosis not present

## 2021-09-07 DIAGNOSIS — R69 Illness, unspecified: Secondary | ICD-10-CM | POA: Diagnosis not present

## 2021-09-07 DIAGNOSIS — F900 Attention-deficit hyperactivity disorder, predominantly inattentive type: Secondary | ICD-10-CM | POA: Diagnosis not present

## 2021-09-07 MED ORDER — LISDEXAMFETAMINE DIMESYLATE 70 MG PO CAPS: 70.0000 mg | ORAL_CAPSULE | Freq: Every day | ORAL | 0 refills | Status: DC

## 2021-09-07 MED ORDER — MEMANTINE HCL 10 MG PO TABS: ORAL_TABLET | ORAL | 1 refills | Status: DC

## 2021-09-07 MED ORDER — LISDEXAMFETAMINE DIMESYLATE 60 MG PO CAPS: 60.0000 mg | ORAL_CAPSULE | ORAL | 0 refills | Status: DC

## 2021-09-07 NOTE — Progress Notes (Signed)
Madeline Young [id number] [birth year] 65 y.o.   Subjective:   Patient ID:  Madeline Young is a 65 y.o. (DOB [birth year]) female.  Chief Complaint:  Chief Complaint  Patient presents with   Follow-up   ADHD   Sleeping Problem   Depression   Anxiety   Stress    RA flair    Depression        Associated symptoms include decreased concentration.  Associated symptoms include no suicidal ideas. Madeline Young presents to the office today for follow-up of ADD and anxiety and depression and sleep.  seen August 2020.  No meds were changed.  On Vyvanse 50, fluoxetine 20, and Ambien 5.  seen November 30, 2019.  The following was noted: "Down".  Doesn't feel Vyvanse helping as much.  More scattered and doesn't finish things.  No SE with it. Sleep pattern is worse as Covid progressed.  Had reduced Zolpidem to 5 mg but then increased to 7.5 mg HS.  If takes Benadryl but can sleep but then has crying spells after it and hangover.  In the AM feels more negative and tearful.  AM is hard bc stiff in the morning. Plan:Continue Vyvanse 50 mg AM Increase fluoxetine to 30 mg daily.  She doesn't want to go to 40 bc fear of serotonin syndrome.  Thinks she had some of those sx in the past Continue zolpidem 5 mg daily.  01/29/20 appt, reported:  Better with increased fluoxetine re: mood. No SE with meds. Still ADD problems not as well controlled as she'd like. Still problems with sleep and doesn't think ambien workiing as well as it should.  Tried trazodone with Ambien but had hangover and difficulty waking up and not a deep good sleep.  Never tried trazodone alone.  Increased Ambien on her own to 10 mg and ran out early.  Reduced to 5 mg and added Benadryl.  Erratic sleep since Covid.  Go to bed same time 10.  Avoids alcohol 2 hours before. Primary problem is going to sleep.  History of 10 mg Ambien for years.  Says 7.5 mg Ambien will work. Caffeine none after noon. Plan: Increase Vyvanse 60  mg AM continue fluoxetine to 30 mg daily.  She doesn't want to go to 40 bc fear of serotonin syndrome.  Thinks she had some of those sx in the past.  It helped to increase Increase zolpidem 5-10 mg daily.  08/02/2020 appointment with the following noted: Went back down to 20 mg fluoxetine bc of more HA and fear of serotonin syndrome bc she feels she had it at some point in the past.  Taking 20 mg for mos. Still problems with sleep.  Down to 2.5 mg Ambien bc when takes more feels out of it the next day.   Thinks taking Ambien too long. Too much awakening. No marked benefit with increased Vyvanse to 60 mg and feels more irritable and edgy. However still distractible and inefficient and hard to finish things. Forgetful and loses things. Plan: Reduce Vyvanse to 50 mg AM continue fluoxetine to 20 mg daily DT her fear of serotonin syndrome.  Thinks she had some of those sx in the past.  May have reduced benefit DT lower dose. Trial Belsomra 15-20 mg or Dayvigo for sleep instead of Ambien bc hangover with 5 mg daily.  11/01/2020 appointment with the following noted: Phone call 08/16/2020:Rtc to patient and she did try the [place] and reports having the "hang over and drowsy" effect. Same  as the trazodone.  She is asking to try Lunesta instead.  10/07/2020 she called back again stating she is wanted to go back to Ambien 5 mg. Ambien seems to work again and getting 8 hours now. Re: reduction vyvanse to 50 mg.  Now feels that it's now working well.  Very distractible and not finishing things.  Not affordable to take the biologicals any more and had to stop but pain is not worse. Plan increase Vyvanse to 60 mg daily. For sleep return to Ambien 5 mg nightly because of failures of alternatives.  03/08/2021 appointment with the following noted: Doing good overall.   Taking 7.5 mg Ambien.  Need to sleep and satisfied with it now. Overall in a good place emotionally and mentally.  Satisfied with meds. No  SE now. Tolerated Vyvanse 60 fine and pleased with it.   Prednisone back a couple of weeks ago.  Pain can awaken her also. Pleased to sell property. Plan: no med changes  09/07/2021 appointment with the following noted: Worst couple of mos.   July flair up of arthritis, the worst ever related to stress, the heat and over exertion. Took 5 mos to see new rheumatologist and sees them tomorrow. Got more down with pain. On prednisone it helped.  On 20 mg prednisone had irritability and ADD worse and insomnia.  Forgetful, distractible to marked degree.  Trouble making decisions.  Rewriting lists of things to do wasting time.  Better cognition with less prednisone.  Would rather hurt than be like this. Almost nonfunctional at this time.  Always doing things.   Having BA remodeled.  Sleep 6-7 hours.  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  Helping to care for 86-month-old [place] in [place].  Past Psychiatric Medication Trials:  trazodone hangover, Ambien, belsomra, Dayvigo to expensive, melatonin, Benadryl hangover and combos (Lunesta never tried) Xanax hangover Fluoxetine 60, sertraline NR, duloxetine, Lexapro, Wellbutrin, buspirone,  Ritalin LA, Concerta 54, Adderall 20 dizzy, Vyvanse 60  Under care at this office since July 2002   Review of Systems:  Review of Systems  HENT:  Positive for ear pain.   Cardiovascular:  Negative for palpitations.  Musculoskeletal:  Positive for arthralgias and back pain.  Skin:  Positive for rash.  Neurological:  Negative for dizziness, tremors and weakness.  Psychiatric/Behavioral:  Positive for decreased concentration and sleep disturbance. Negative for agitation, behavioral problems, confusion, dysphoric mood, hallucinations, self-injury and suicidal ideas. The patient is not nervous/anxious and is not hyperactive.    Medications: I have reviewed the patient's current medications.  Current Outpatient Medications   Medication Sig Dispense Refill   aspirin 81 MG tablet Take 81 mg by mouth daily.     calcium carbonate (OS-CAL - DOSED IN MG OF ELEMENTAL CALCIUM) 1250 (500 Ca) MG tablet      calcium-vitamin D (OSCAL WITH D) 250-125 MG-UNIT tablet Take 1 tablet by mouth daily.     cholecalciferol (VITAMIN D) 1000 UNITS tablet Take 1,000 Units by mouth daily.     clobetasol (TEMOVATE) 0.05 % external solution      diclofenac Sodium (VOLTAREN) 1 % GEL APPLY 2 GRAMS TO AFFECTED AREA 4 TIMES A DAY 300 g 3   fluocinonide ointment (LIDEX) 0.05 % APPLY TWICE DAILY TO AFFECTED AREAS UNTIL IMPROVED THEN AS NEEDED FOR FLARES     FLUoxetine (PROZAC) 20 MG capsule TAKE 1 CAPSULE BY MOUTH EVERY DAY 90 capsule 0   ipratropium (ATROVENT) 0.03 % nasal spray  Place into both nostrils.     MAGNESIUM-ZINC PO Take 1 tablet by mouth daily.     memantine (NAMENDA) 10 MG tablet 1/2 tablet nightly for 1 week then 1/2 in AM and 1 in PM for 1 week then 1 twice daily 60 tablet 1   Multiple Vitamin (MULTIVITAMIN) tablet Take 1 tablet by mouth daily.     nystatin-triamcinolone ointment (MYCOLOG) Apply 1 application topically 2 (two) times daily. 30 g 1   Omega-3 Fatty Acids (FISH OIL) 1200 MG CAPS Take 1,200 mg by mouth daily.      oxyCODONE-acetaminophen (PERCOCET/ROXICET) 5-325 MG tablet Take 1-2 tablets by mouth daily as needed for moderate pain. 45 tablet 0   Polyethyl Glycol-Propyl Glycol 0.4-0.3 % SOLN Place 1 drop into both eyes daily as needed (for dry eyes).     triamcinolone cream (KENALOG) 0.1 % APPLY TO AFFECTED AREA TWICE A DAY UNTIL CLEAR THEN AS NEEDED     zolpidem (AMBIEN) 5 MG tablet Take 1 tablet (5 mg total) by mouth at bedtime as needed for sleep. On empty stomach 30 tablet 5   Lactobacillus (PROBIOTIC ACIDOPHILUS PO) Take 1 capsule by mouth daily. (Patient not taking: Reported on 09/07/2021)     [START ON 10/05/2021] lisdexamfetamine (VYVANSE) 70 MG capsule Take 1 capsule (70 mg total) by mouth daily. 30 capsule 0    lisdexamfetamine (VYVANSE) 70 MG capsule Take 1 capsule (70 mg total) by mouth daily. 30 capsule 0   Probiotic Product (PROBIOTIC ADVANCED PO) Take 1 capsule by mouth daily. (Patient not taking: Reported on 09/07/2021)     terbinafine (LAMISIL) 250 MG tablet Take 1 tablet (250 mg total) by mouth daily. (Patient not taking: Reported on 09/07/2021) 30 tablet 0   No current facility-administered medications for this visit.    Medication Side Effects: None  Allergies:  Allergies  Allergen Reactions   Cosentyx [Secukinumab] Rash    Severe case of psorasis   Sertraline Hcl Other (See Comments)    Made me crazy    Avelox [Moxifloxacin Hcl In Nacl]     Pt cant remember reaction   Nitrofurantoin     Other reaction(s): Other (See Comments) tired Pt cant remember reaction Other reaction(s): Unknown   Rosuvastatin     Other reaction(s): Unknown   Latex Itching and Rash    Redness   Macrobid [Nitrofurantoin Monohyd Macro] Other (See Comments)    tired   Statins Other (See Comments)    Muscle aches    Past Medical History:  Diagnosis Date   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   ADHD (attention deficit hyperactivity disorder)    Allergy    Anxiety    Arthritis    Cataract    bil cateracts removed   Complication of anesthesia    first colonoscopy pt states she woke up   Depression    GERD (gastroesophageal reflux disease)    Globus sensation    Hyperlipidemia    Insomnia    Multinodular goiter (nontoxic)    Psoriatic arthritis ([place])    Spinal stenosis of lumbar region June 2016   MRI    Statin intolerance 01/27/2013    Family History  Problem Relation Age of Onset   Heart disease Mother    Hyperlipidemia Mother    Coronary artery disease Father 45       CABG in early 14's   Hyperlipidemia Father    Epilepsy Grandchild        Severe form   Colon  cancer Neg Hx    Esophageal cancer Neg Hx    Rectal cancer Neg Hx    Stomach cancer Neg Hx     Social History    Socioeconomic History   Marital status: Married    Spouse name: Not on file   Number of children: 2   Years of education: Not on file   Highest education level: Not on file  Occupational History   Occupation: disability  Tobacco Use   Smoking status: Former    Types: Cigarettes    Quit date: 08/21/1971    Years since quitting: 50.0   Smokeless tobacco: Never  Vaping Use   Vaping Use: Never used  Substance and Sexual Activity   Alcohol use: Yes    Alcohol/week: 7.0 standard drinks    Types: 7 Glasses of wine per week   Drug use: No   Sexual activity: Not on file  Other Topics Concern   Not on file  Social History Narrative   Married, retired Therapist, sports   2 daughters some grandchildren   1 caffeinated drinks daily   1 alcoholic beverage daily   No tobacco      Social Determinants of Radio broadcast assistant Strain: Low Risk    Difficulty of Paying Living Expenses: Not hard at all  Food Insecurity: No Food Insecurity   Worried About Charity fundraiser in the Last Year: Never true   Arboriculturist in the Last Year: Never true  Transportation Needs: No Transportation Needs   Lack of Transportation (Medical): No   Lack of Transportation (Non-Medical): No  Physical Activity: Insufficiently Active   Days of Exercise per Week: 2 days   Minutes of Exercise per Session: 50 min  Stress: No Stress Concern Present   Feeling of Stress : Not at all  Social Connections: Unknown   Frequency of Communication with Friends and Family: Not on file   Frequency of Social Gatherings with Friends and Family: Not on file   Attends Religious Services: Not on Electrical engineer or Organizations: Not on file   Attends Archivist Meetings: Not on file   Marital Status: Married  Human resources officer Violence: Not At Risk   Fear of Current or Ex-Partner: No   Emotionally Abused: No   Physically Abused: No   Sexually Abused: No    Past Medical History, Surgical history,  Social history, and Family history were reviewed and updated as appropriate.   Please see review of systems for further details on the patient's review from today.   Objective:   Physical Exam:  BP 127/72   Pulse 87   Physical Exam Constitutional:      General: She is not in acute distress.    Appearance: Normal appearance. She is well-developed.  Musculoskeletal:        General: No deformity.  Neurological:     Mental Status: She is alert and oriented to person, place, and time.     Motor: No tremor.     Coordination: Coordination normal.     Gait: Gait normal.  Psychiatric:        Attention and Perception: Perception normal. She is inattentive.        Mood and Affect: Mood is anxious. Mood is not depressed. Affect is not labile, blunt, angry or inappropriate.        Speech: Speech normal.        Behavior: Behavior normal.  Thought Content: Thought content normal. Thought content does not include homicidal or suicidal ideation. Thought content does not include homicidal or suicidal plan.        Cognition and Memory: Cognition normal. She exhibits impaired recent memory.        Judgment: Judgment normal.     Comments: Insight intact. No auditory or visual hallucinations. No delusions.  Talkative per usual. Mood good.    Lab Review:     Component Value Date/Time   NA 139 02/09/2021 1409   K 4.3 02/09/2021 1409   CL 102 02/09/2021 1409   CO2 31 02/09/2021 1409   GLUCOSE 85 02/09/2021 1409   BUN 31 (H) 02/09/2021 1409   CREATININE 0.72 02/09/2021 1409   CALCIUM 9.2 02/09/2021 1409   PROT 6.8 02/09/2021 1409   ALBUMIN 4.3 02/09/2021 1409   AST 16 02/09/2021 1409   ALT 14 02/09/2021 1409   ALKPHOS 54 02/09/2021 1409   BILITOT 0.5 02/09/2021 1409   GFRNONAA >60 02/18/2016 0322   GFRAA >60 02/18/2016 0322       Component Value Date/Time   WBC 4.6 08/07/2019 1159   RBC 3.96 08/07/2019 1159   HGB 13.0 08/07/2019 1159   HCT 38.3 08/07/2019 1159   PLT 238.0  08/07/2019 1159   MCV 96.6 08/07/2019 1159   MCH 31.0 02/18/2016 0322   MCHC 33.9 08/07/2019 1159   RDW 13.0 08/07/2019 1159   LYMPHSABS 2.1 08/07/2019 1159   MONOABS 0.4 08/07/2019 1159   EOSABS 0.1 08/07/2019 1159   BASOSABS 0.0 08/07/2019 1159    No results found for: POCLITH, LITHIUM   No results found for: PHENYTOIN, PHENOBARB, VALPROATE, CBMZ   .res Assessment: Plan:    Odean was seen today for follow-up, adhd, sleeping problem, depression, anxiety and stress.  Diagnoses and all orders for this visit:  Attention deficit hyperactivity disorder (ADHD), predominantly inattentive type -     lisdexamfetamine (VYVANSE) 70 MG capsule; Take 1 capsule (70 mg total) by mouth daily. -     Discontinue: lisdexamfetamine (VYVANSE) 60 MG capsule; Take 1 capsule (60 mg total) by mouth every morning. -     memantine (NAMENDA) 10 MG tablet; 1/2 tablet nightly for 1 week then 1/2 in AM and 1 in PM for 1 week then 1 twice daily -     lisdexamfetamine (VYVANSE) 70 MG capsule; Take 1 capsule (70 mg total) by mouth daily.  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  Disc SE and withdrawal and dosing options.  Would be best to control it.  Discussed potential benefits, risks, and side effects of stimulants with patient to include increased heart rate, palpitations, insomnia, increased anxiety, increased irritability, or decreased appetite.  Instructed patient to contact office if experiencing any significant tolerability issues.  Disc risk steroids affecting mood and sleep.  Supportive therapy dealing with stubborn H with memory problems. Disc poss rheum consultation at [facility].    tolerating Vyvanse but less benefit Dt prednisone increase to 70 mg AM.  Consider further increase if tolerated.  Consider memantine off label because of chronic poorly controlled  ADHD. Option [place] Yes trial Namenda off label.  continue fluoxetine to 20 mg daily DT her fear of serotonin syndrome.  Thinks she had some of those sx in the past.  May have reduced benefit DT lower dose.  LED Ambien 5-10 mg HS.   Disc SE.  If it  fails then try Lunesta  Consider alternative treatments for ADD including off label.  This appt was 30 mins.   FU 6 mos.  Lynder Parents, MD, DFAPA   Please see After Visit Summary for patient specific instructions.  Future Appointments  Date Time Provider [place]  09/12/2021  1:15 PM [place] [place] [facility]  09/23/2021  4:20 PM [facility]  11/09/2021 10:30 AM Crecencio Mc, MD [facility]    No orders of the defined types were placed in this encounter.     -------------------------------

## 2021-09-08 DIAGNOSIS — Z79899 Other long term (current) drug therapy: Secondary | ICD-10-CM | POA: Diagnosis not present

## 2021-09-08 DIAGNOSIS — G8929 Other chronic pain: Secondary | ICD-10-CM | POA: Diagnosis not present

## 2021-09-08 DIAGNOSIS — M1612 Unilateral primary osteoarthritis, left hip: Secondary | ICD-10-CM | POA: Diagnosis not present

## 2021-09-08 DIAGNOSIS — M19071 Primary osteoarthritis, right ankle and foot: Secondary | ICD-10-CM | POA: Diagnosis not present

## 2021-09-08 DIAGNOSIS — R1032 Left lower quadrant pain: Secondary | ICD-10-CM | POA: Diagnosis not present

## 2021-09-08 DIAGNOSIS — Z872 Personal history of diseases of the skin and subcutaneous tissue: Secondary | ICD-10-CM | POA: Diagnosis not present

## 2021-09-08 DIAGNOSIS — M797 Fibromyalgia: Secondary | ICD-10-CM | POA: Diagnosis not present

## 2021-09-08 DIAGNOSIS — R69 Illness, unspecified: Secondary | ICD-10-CM | POA: Diagnosis not present

## 2021-09-08 DIAGNOSIS — L4050 Arthropathic psoriasis, unspecified: Secondary | ICD-10-CM | POA: Diagnosis not present

## 2021-09-08 DIAGNOSIS — M180 Bilateral primary osteoarthritis of first carpometacarpal joints: Secondary | ICD-10-CM | POA: Diagnosis not present

## 2021-09-08 DIAGNOSIS — Z7982 Long term (current) use of aspirin: Secondary | ICD-10-CM | POA: Diagnosis not present

## 2021-09-08 DIAGNOSIS — M159 Polyosteoarthritis, unspecified: Secondary | ICD-10-CM | POA: Diagnosis not present

## 2021-09-08 DIAGNOSIS — M19072 Primary osteoarthritis, left ankle and foot: Secondary | ICD-10-CM | POA: Diagnosis not present

## 2021-09-08 DIAGNOSIS — M160 Bilateral primary osteoarthritis of hip: Secondary | ICD-10-CM | POA: Diagnosis not present

## 2021-09-08 DIAGNOSIS — I10 Essential (primary) hypertension: Secondary | ICD-10-CM | POA: Diagnosis not present

## 2021-09-08 DIAGNOSIS — Z7952 Long term (current) use of systemic steroids: Secondary | ICD-10-CM | POA: Diagnosis not present

## 2021-09-09 ENCOUNTER — Telehealth: Payer: Self-pay | Admitting: Podiatry

## 2021-09-09 NOTE — Telephone Encounter (Signed)
She cancel her appt on the 5th due to her not starting her medication yet

## 2021-09-12 ENCOUNTER — Ambulatory Visit: Payer: Medicare HMO | Admitting: Podiatry

## 2021-09-23 ENCOUNTER — Ambulatory Visit: Payer: Medicare HMO

## 2021-10-03 ENCOUNTER — Other Ambulatory Visit: Payer: Self-pay | Admitting: Psychiatry

## 2021-10-03 DIAGNOSIS — F900 Attention-deficit hyperactivity disorder, predominantly inattentive type: Secondary | ICD-10-CM

## 2021-10-12 ENCOUNTER — Telehealth: Payer: Self-pay | Admitting: Internal Medicine

## 2021-10-14 MED ORDER — CELECOXIB 200 MG PO CAPS: 200.0000 mg | ORAL_CAPSULE | Freq: Every day | ORAL | 0 refills | Status: DC

## 2021-10-14 NOTE — Telephone Encounter (Signed)
Patient called about her refill. Pharmacy stated that her provider did not ok refill.

## 2021-10-14 NOTE — Telephone Encounter (Signed)
Spoke with pt and let her know that the medication has been refilled.

## 2021-10-14 NOTE — Telephone Encounter (Signed)
Pt requested a refill of Celebrex. Not in pt's current medication list.   Last OV: 08/17/2021 Next OV: 11/09/2021 Last CMP: 02/09/2021 abnormal BUN level: 31 everything else normal

## 2021-10-14 NOTE — Telephone Encounter (Signed)
Refilled

## 2021-10-14 NOTE — Addendum Note (Signed)
Addended by: Crecencio Mc on: 10/14/2021 02:30 PM   Modules accepted: Orders

## 2021-10-24 ENCOUNTER — Other Ambulatory Visit: Payer: Self-pay | Admitting: Psychiatry

## 2021-10-24 DIAGNOSIS — F3342 Major depressive disorder, recurrent, in full remission: Secondary | ICD-10-CM

## 2021-10-26 ENCOUNTER — Ambulatory Visit: Payer: Medicare HMO

## 2021-10-26 ENCOUNTER — Other Ambulatory Visit: Payer: Self-pay

## 2021-10-26 ENCOUNTER — Ambulatory Visit
Admission: RE | Admit: 2021-10-26 | Discharge: 2021-10-26 | Disposition: A | Discharge status: Home / Self Care | Payer: Medicare HMO | Source: Ambulatory Visit | Attending: Internal Medicine | Admitting: Internal Medicine

## 2021-10-26 DIAGNOSIS — Z1231 Encounter for screening mammogram for malignant neoplasm of breast: Secondary | ICD-10-CM

## 2021-10-27 ENCOUNTER — Other Ambulatory Visit: Payer: Self-pay | Admitting: Internal Medicine

## 2021-10-27 ENCOUNTER — Telehealth: Payer: Self-pay | Admitting: Internal Medicine

## 2021-10-27 DIAGNOSIS — R928 Other abnormal and inconclusive findings on diagnostic imaging of breast: Secondary | ICD-10-CM

## 2021-10-27 NOTE — Telephone Encounter (Signed)
LMTCB

## 2021-10-27 NOTE — Telephone Encounter (Signed)
IMPRESSION: Further evaluation is suggested for possible asymmetry in the left breast.   RECOMMENDATION: -ordered call to schedule  Diagnostic mammogram and possibly ultrasound of the left breast. (Code:[id number])   The patient will be contacted regarding the findings, and additional imaging will be scheduled.   BI-RADS CATEGORY  0: Incomplete. Need additional imaging evaluation and/or prior mammograms for comparison.     Electronically Signed   By: Abelardo Diesel M.D.   On: 10/26/2021 13:18

## 2021-10-27 NOTE — Telephone Encounter (Signed)
I called and spoke with patient & [facility] has already called and appointment made for 2/6. She was also placed on cancellation list to see if anything sooner comes available so she doesn't have to wait so long to make sure there is no malignancy seen.

## 2021-10-27 NOTE — Telephone Encounter (Signed)
Patient had a mammogram at the [facility] in [place]. She received a email stating that there was a issue with the left side of one breast and need to come back to be re scanned. When [facility] called [facility] they would not tell her why she needed to come back and to call Dr Derrel Nip to get her results. She would like someone to call her. She stated that it would be 11/29/2021 until they could get her back in to be scanned again.

## 2021-11-01 DIAGNOSIS — M461 Sacroiliitis, not elsewhere classified: Secondary | ICD-10-CM | POA: Diagnosis not present

## 2021-11-01 DIAGNOSIS — S76012A Strain of muscle, fascia and tendon of left hip, initial encounter: Secondary | ICD-10-CM | POA: Diagnosis not present

## 2021-11-01 DIAGNOSIS — M659 Synovitis and tenosynovitis, unspecified: Secondary | ICD-10-CM | POA: Diagnosis not present

## 2021-11-01 DIAGNOSIS — M67853 Other specified disorders of tendon, right hip: Secondary | ICD-10-CM | POA: Diagnosis not present

## 2021-11-01 DIAGNOSIS — M25452 Effusion, left hip: Secondary | ICD-10-CM | POA: Diagnosis not present

## 2021-11-01 DIAGNOSIS — M25551 Pain in right hip: Secondary | ICD-10-CM | POA: Diagnosis not present

## 2021-11-01 DIAGNOSIS — M1120 Other chondrocalcinosis, unspecified site: Secondary | ICD-10-CM | POA: Diagnosis not present

## 2021-11-01 DIAGNOSIS — S76011A Strain of muscle, fascia and tendon of right hip, initial encounter: Secondary | ICD-10-CM | POA: Diagnosis not present

## 2021-11-01 DIAGNOSIS — M25451 Effusion, right hip: Secondary | ICD-10-CM | POA: Diagnosis not present

## 2021-11-01 DIAGNOSIS — R799 Abnormal finding of blood chemistry, unspecified: Secondary | ICD-10-CM | POA: Diagnosis not present

## 2021-11-01 DIAGNOSIS — M67854 Other specified disorders of tendon, left hip: Secondary | ICD-10-CM | POA: Diagnosis not present

## 2021-11-01 DIAGNOSIS — M25552 Pain in left hip: Secondary | ICD-10-CM | POA: Diagnosis not present

## 2021-11-01 DIAGNOSIS — M1909 Primary osteoarthritis, other specified site: Secondary | ICD-10-CM | POA: Diagnosis not present

## 2021-11-01 DIAGNOSIS — M948X8 Other specified disorders of cartilage, other site: Secondary | ICD-10-CM | POA: Diagnosis not present

## 2021-11-07 DIAGNOSIS — L308 Other specified dermatitis: Secondary | ICD-10-CM | POA: Diagnosis not present

## 2021-11-07 DIAGNOSIS — M129 Arthropathy, unspecified: Secondary | ICD-10-CM | POA: Diagnosis not present

## 2021-11-09 ENCOUNTER — Encounter: Payer: Self-pay | Admitting: Internal Medicine

## 2021-11-09 ENCOUNTER — Ambulatory Visit (INDEPENDENT_AMBULATORY_CARE_PROVIDER_SITE_OTHER): Payer: Medicare HMO | Admitting: Internal Medicine

## 2021-11-09 ENCOUNTER — Other Ambulatory Visit: Payer: Self-pay

## 2021-11-09 VITALS — BP 118/70 | HR 101 | Temp 98.8°F | Ht 64.0 in | Wt 168.0 lb

## 2021-11-09 DIAGNOSIS — M25551 Pain in right hip: Secondary | ICD-10-CM | POA: Diagnosis not present

## 2021-11-09 DIAGNOSIS — F325 Major depressive disorder, single episode, in full remission: Secondary | ICD-10-CM | POA: Diagnosis not present

## 2021-11-09 DIAGNOSIS — Z79899 Other long term (current) drug therapy: Secondary | ICD-10-CM | POA: Diagnosis not present

## 2021-11-09 DIAGNOSIS — M25552 Pain in left hip: Secondary | ICD-10-CM | POA: Diagnosis not present

## 2021-11-09 DIAGNOSIS — L4052 Psoriatic arthritis mutilans: Secondary | ICD-10-CM | POA: Diagnosis not present

## 2021-11-09 DIAGNOSIS — L4050 Arthropathic psoriasis, unspecified: Secondary | ICD-10-CM | POA: Diagnosis not present

## 2021-11-09 DIAGNOSIS — M160 Bilateral primary osteoarthritis of hip: Secondary | ICD-10-CM | POA: Diagnosis not present

## 2021-11-09 DIAGNOSIS — R69 Illness, unspecified: Secondary | ICD-10-CM | POA: Diagnosis not present

## 2021-11-09 DIAGNOSIS — F3342 Major depressive disorder, recurrent, in full remission: Secondary | ICD-10-CM | POA: Diagnosis not present

## 2021-11-09 LAB — COMPREHENSIVE METABOLIC PANEL
ALT: 17 U/L (ref 0–35)
AST: 17 U/L (ref 0–37)
Albumin: 4.5 g/dL (ref 3.5–5.2)
Alkaline Phosphatase: 47 U/L (ref 39–117)
BUN: 24 mg/dL — ABNORMAL HIGH (ref 6–23)
CO2: 32 mEq/L (ref 19–32)
Calcium: 9.5 mg/dL (ref 8.4–10.5)
Chloride: 102 mEq/L (ref 96–112)
Creatinine, Ser: 0.72 mg/dL (ref 0.40–1.20)
GFR: 86.63 mL/min (ref >60.00)
Glucose, Bld: 127 mg/dL — ABNORMAL HIGH (ref 70–99)
Potassium: 4.1 mEq/L (ref 3.5–5.1)
Sodium: 138 mEq/L (ref 135–145)
Total Bilirubin: 0.4 mg/dL (ref 0.2–1.2)
Total Protein: 6.8 g/dL (ref 6.0–8.3)

## 2021-11-09 MED ORDER — OXYCODONE-ACETAMINOPHEN 5-325 MG PO TABS: 1.0000 | ORAL_TABLET | Freq: Three times a day (TID) | ORAL | 0 refills | Status: DC | PRN

## 2021-11-09 NOTE — Progress Notes (Signed)
Subjective:  Patient ID: Madeline Young, female    DOB: [birth year]  Age: 66 y.o. MRN: [id number]  CC: The primary encounter diagnosis was Long-term use of high-risk medication. 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   This visit occurred during the SARS-CoV-2 public health emergency.  Safety protocols were in place, including screening questions prior to the visit, additional usage of staff PPE, and extensive cleaning of exam room while observing appropriate contact time as indicated for disinfecting solutions.    HPI Madeline Young presents for follow up  Chief Complaint  Patient presents with   Follow-up    3 month follow up for medication refills   She is frustrated at the development of a potentially alternative diagnosis and by her debilitating pain that has made all everyday  activities very difficult to perform.  She feels discouraged.    fingers cracking,    loosening of dental crown.  Taking 2000 mg tylenol  celebrex once daily ,  and diclofenac gel.  Frustrated at lack of biologics   Psych:  her psychiatrist recently added Namenda for memory issues to her regimen of  prozac and vyvanse but she did not tolerate namenda due to dizziness.   1)BILATERAL HIP PAIN with inflammatory arthropathy suggested by MRI done by new Rheumatologist  at [facility].  She has resumed  prednisone.  Sees orthopedics Dr.  Fransisco Beau today for evaluation of her hips and hopefully injections.  Left hip hurts more than right hip.   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  Iron,  mg were normal. .  She was prescribed placquenil and colchicine  since early December with no change in symptoms  until  prednisone was started   3) abnormal mammogram . .  Ultrasound and diagnostic mammogram has been set up for feb 6.  4)  loosening of a permanent crown front maxillary.  Dental implants advised   Outpatient Medications Prior to Visit  Medication Sig Dispense Refill   aspirin 81 MG tablet Take 81 mg by mouth daily.     calcium carbonate (OS-CAL - DOSED IN MG OF ELEMENTAL CALCIUM) 1250 (500 Ca) MG tablet      calcium-vitamin D (OSCAL WITH D) 250-125 MG-UNIT tablet Take 1 tablet by mouth daily.     celecoxib (CELEBREX) 200 MG capsule Take 1 capsule (200 mg total) by mouth daily. 90 capsule 0   cholecalciferol (VITAMIN D) 1000 UNITS tablet Take 1,000 Units by mouth daily.     clobetasol (TEMOVATE) 0.05 % external solution      colchicine 0.6 MG tablet Take 0.6 mg by mouth daily.     diclofenac Sodium (VOLTAREN) 1 % GEL APPLY 2 GRAMS TO AFFECTED AREA 4 TIMES A DAY 300 g 3   fluocinonide ointment (LIDEX) 0.05 % APPLY TWICE DAILY TO AFFECTED AREAS UNTIL IMPROVED THEN AS NEEDED FOR FLARES     FLUoxetine (PROZAC) 20 MG capsule TAKE 1 CAPSULE BY MOUTH EVERY DAY 90 capsule 0   hydroxychloroquine (PLAQUENIL) 200 MG tablet Take 400 mg by mouth daily.     ipratropium (ATROVENT) 0.03 % nasal spray Place into both nostrils.     lisdexamfetamine (VYVANSE) 70 MG capsule Take 1 capsule (70 mg total) by mouth daily.  30 capsule 0   lisdexamfetamine (VYVANSE) 70 MG capsule Take 1 capsule (70 mg total) by mouth daily. 30 capsule 0   MAGNESIUM-ZINC PO Take 1 tablet by mouth daily.     Multiple Vitamin (MULTIVITAMIN) tablet Take 1 tablet by mouth daily.     nystatin-triamcinolone ointment (MYCOLOG) Apply 1 application topically 2 (two) times daily. 30 g 1   Omega-3 Fatty Acids (FISH OIL) 1200 MG CAPS Take 1,200 mg by mouth daily.      Polyethyl Glycol-Propyl Glycol 0.4-0.3 % SOLN Place 1 drop into both eyes daily as needed (for dry eyes).     predniSONE (DELTASONE) 5 MG tablet Take by mouth.     triamcinolone  cream (KENALOG) 0.1 % APPLY TO AFFECTED AREA TWICE A DAY UNTIL CLEAR THEN AS NEEDED     Turmeric 500 MG TABS Take 1 tablet by mouth daily.     zolpidem (AMBIEN) 5 MG tablet Take 1 tablet (5 mg total) by mouth at bedtime as needed for sleep. On empty stomach 30 tablet 5   oxyCODONE-acetaminophen (PERCOCET/ROXICET) 5-325 MG tablet Take 1-2 tablets by mouth daily as needed for moderate pain. 45 tablet 0   memantine (NAMENDA) 10 MG tablet 1/2 TABLET NIGHTLY FOR 1 WEEK THEN 1/2 IN AM AND 1 IN PM FOR 1 WEEK THEN 1 TWICE DAILY (Patient not taking: Reported on 11/09/2021) 180 tablet 0   Lactobacillus (PROBIOTIC ACIDOPHILUS PO) Take 1 capsule by mouth daily. (Patient not taking: Reported on 11/09/2021)     Probiotic Product (PROBIOTIC ADVANCED PO) Take 1 capsule by mouth daily. (Patient not taking: Reported on 11/09/2021)     terbinafine (LAMISIL) 250 MG tablet Take 1 tablet (250 mg total) by mouth daily. (Patient not taking: Reported on 11/09/2021) 30 tablet 0   No facility-administered medications prior to visit.    Review of Systems;  Patient denies headache, fevers, malaise, unintentional weight loss, skin rash, eye pain, sinus congestion and sinus pain, sore throat, dysphagia,  hemoptysis , cough, dyspnea, wheezing, chest pain, palpitations, orthopnea, edema, abdominal pain, nausea, melena, diarrhea, constipation, flank pain, dysuria, hematuria, urinary  Frequency, nocturia, numbness, tingling, seizures,  Focal weakness, Loss of consciousness,  Tremor, insomnia, depression, anxiety, and suicidal ideation.      Objective:  BP 118/70 (BP Location: Left Arm, Patient Position: Sitting, Cuff Size: Large)    Pulse (!) 101    Temp 98.8 F (37.1 C) (Oral)    Ht 5\' 4"  (1.626 m)    Wt 168 lb (76.2 kg)    SpO2 99%    BMI 28.84 kg/m   BP Readings from Last 3 Encounters:  11/09/21 118/70  08/17/21 (!) 156/62  05/12/21 (!) 112/58    Wt Readings from Last 3 Encounters:  11/09/21 168 lb (76.2 kg)  08/17/21 170 lb  9.6 oz (77.4 kg)  07/19/21 169 lb (76.7 kg)    General appearance: alert, cooperative and appears stated age Ears: normal TM's and external ear canals both ears Throat: lips, mucosa, and tongue normal; teeth and gums normal Neck: no adenopathy, no carotid bruit, supple, symmetrical, trachea midline and thyroid not enlarged, symmetric, no tenderness/mass/nodules Back: symmetric, no curvature. ROM normal. No CVA tenderness. Lungs: clear to auscultation bilaterally Heart: regular rate and rhythm, S1, S2 normal, no murmur, click, rub or gallop Abdomen: soft, non-tender; bowel sounds normal; no masses,  no organomegaly Pulses: 2+ and symmetric Skin: Skin color, texture, turgor normal. No rashes or lesions Lymph nodes: Cervical, supraclavicular, and axillary nodes normal.  No results found for: HGBA1C  Lab Results  Component Value Date   CREATININE 0.72 11/09/2021   CREATININE 0.72 02/09/2021   CREATININE 0.68 08/09/2020    Lab Results  Component Value Date   WBC 4.6 08/07/2019   HGB 13.0 08/07/2019   HCT 38.3 08/07/2019   PLT 238.0 08/07/2019   GLUCOSE 127 (H) 11/09/2021   CHOL 389 (H) 02/09/2021   TRIG 118.0 02/09/2021   HDL 66.50 02/09/2021   LDLDIRECT 252.3 06/12/2012   LDLCALC 299 (H) 02/09/2021   ALT 17 11/09/2021   AST 17 11/09/2021   NA 138 11/09/2021   K 4.1 11/09/2021   CL 102 11/09/2021   CREATININE 0.72 11/09/2021   BUN 24 (H) 11/09/2021   CO2 32 11/09/2021   TSH 2.89 08/09/2020    MM 3D SCREEN BREAST BILATERAL  Result Date: 10/26/2021 CLINICAL DATA:  Screening. EXAM: DIGITAL SCREENING BILATERAL MAMMOGRAM WITH TOMOSYNTHESIS AND CAD TECHNIQUE: Bilateral screening digital craniocaudal and mediolateral oblique mammograms were obtained. Bilateral screening digital breast tomosynthesis was performed. The images were evaluated with computer-aided detection. COMPARISON:  Previous exam(s). ACR Breast Density Category b: There are scattered areas of fibroglandular  density. FINDINGS: In the left breast, a possible asymmetry warrants further evaluation. In the right breast, no findings suspicious for malignancy. IMPRESSION: Further evaluation is suggested for possible asymmetry in the left breast. RECOMMENDATION: Diagnostic mammogram and possibly ultrasound of the left breast. (Code:[id number]-L-40M) The patient will be contacted regarding the findings, and additional imaging will be scheduled. BI-RADS CATEGORY  0: Incomplete. Need additional imaging evaluation and/or prior mammograms for comparison. Electronically Signed   By: Abelardo Diesel M.D.   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[place])    Diagnosis is now in question by new rheumatologist who favors CPPD based on  Appearance of hip joints; she has been sent to Orthopedics for a hip aspiration       Relevant Medications   colchicine 0.6 MG tablet   hydroxychloroquine (PLAQUENIL) 200 MG tablet   predniSONE (DELTASONE) 5 MG tablet   oxyCODONE-acetaminophen (PERCOCET/ROXICET) 5-325 MG tablet   RESOLVED: Psoriatic arthritis ([place])   Relevant Medications   colchicine 0.6 MG tablet   hydroxychloroquine (PLAQUENIL) 200 MG tablet   predniSONE (DELTASONE) 5 MG tablet   oxyCODONE-acetaminophen (PERCOCET/ROXICET) 5-325 MG tablet   Other Visit Diagnoses     Long-term use of high-risk medication    -  Primary   Relevant Orders   Comprehensive metabolic panel (Completed)   Major depression, recurrent, full remission ([place])   (Chronic)         I spent 40 minutes dedicated to the care of this patient on the date of this encounter to include pre-visit review of patient's medical history,  most recent imaging studies, Face-to-face time with the patient , and post visit ordering of testing  and therapeutics.    Follow-up: Return in about 3 months (around 02/06/2022).   Crecencio Mc, MD

## 2021-11-09 NOTE — Assessment & Plan Note (Signed)
Diagnosis is now in question by new rheumatologist who favors CPPD based on  Appearance of hip joints; she has been sent to Orthopedics for a hip aspiration

## 2021-11-09 NOTE — Patient Instructions (Signed)
I have increased your oxycodone to  #90/month to use up to 3 times daily if needed

## 2021-11-09 NOTE — Assessment & Plan Note (Signed)
Symptoms generally improved with management of pain and use of Prozac

## 2021-11-10 ENCOUNTER — Encounter: Payer: Self-pay | Admitting: Psychiatry

## 2021-11-10 ENCOUNTER — Ambulatory Visit: Payer: Medicare HMO | Admitting: Psychiatry

## 2021-11-10 VITALS — BP 121/74 | HR 85

## 2021-11-10 DIAGNOSIS — R69 Illness, unspecified: Secondary | ICD-10-CM | POA: Diagnosis not present

## 2021-11-10 DIAGNOSIS — F5105 Insomnia due to other mental disorder: Secondary | ICD-10-CM | POA: Diagnosis not present

## 2021-11-10 DIAGNOSIS — F3342 Major depressive disorder, recurrent, in full remission: Secondary | ICD-10-CM

## 2021-11-10 DIAGNOSIS — F900 Attention-deficit hyperactivity disorder, predominantly inattentive type: Secondary | ICD-10-CM | POA: Diagnosis not present

## 2021-11-10 MED ORDER — LISDEXAMFETAMINE DIMESYLATE 70 MG PO CAPS: 70.0000 mg | ORAL_CAPSULE | Freq: Every day | ORAL | 0 refills | Status: DC

## 2021-11-10 MED ORDER — FLUOXETINE HCL 20 MG PO CAPS: 20.0000 mg | ORAL_CAPSULE | Freq: Every day | ORAL | 1 refills | Status: DC

## 2021-11-10 NOTE — Progress Notes (Signed)
Madeline Young [id number] Mar 22, 1956 66 y.o.   Subjective:   Patient ID:  Madeline Young is a 66 y.o. (DOB 1956-03-22) female.  Chief Complaint:  Chief Complaint  Patient presents with   Follow-up   ADHD   Depression   Stress    Depression        Associated symptoms include decreased concentration.  Associated symptoms include no suicidal ideas. Madeline Young presents to the office today for follow-up of ADD and anxiety and depression and sleep.  seen August 2020.  No meds were changed.  On Vyvanse 50, fluoxetine 20, and Ambien 5.  seen November 30, 2019.  The following was noted: "Down".  Doesn't feel Vyvanse helping as much.  More scattered and doesn't finish things.  No SE with it. Sleep pattern is worse as Covid progressed.  Had reduced Zolpidem to 5 mg but then increased to 7.5 mg HS.  If takes Benadryl but can sleep but then has crying spells after it and hangover.  In the AM feels more negative and tearful.  AM is hard bc stiff in the morning. Plan:Continue Vyvanse 50 mg AM Increase fluoxetine to 30 mg daily.  She doesn't want to go to 40 bc fear of serotonin syndrome.  Thinks she had some of those sx in the past Continue zolpidem 5 mg daily.  01/29/20 appt, reported:  Better with increased fluoxetine re: mood. No SE with meds. Still ADD problems not as well controlled as she'd like. Still problems with sleep and doesn't think ambien workiing as well as it should.  Tried trazodone with Ambien but had hangover and difficulty waking up and not a deep good sleep.  Never tried trazodone alone.  Increased Ambien on her own to 10 mg and ran out early.  Reduced to 5 mg and added Benadryl.  Erratic sleep since Covid.  Go to bed same time 10.  Avoids alcohol 2 hours before. Primary problem is going to sleep.  History of 10 mg Ambien for years.  Says 7.5 mg Ambien will work. Caffeine none after noon. Plan: Increase Vyvanse 60 mg AM continue fluoxetine to 30 mg daily.   She doesn't want to go to 40 bc fear of serotonin syndrome.  Thinks she had some of those sx in the past.  It helped to increase Increase zolpidem 5-10 mg daily.  08/02/2020 appointment with the following noted: Went back down to 20 mg fluoxetine bc of more HA and fear of serotonin syndrome bc she feels she had it at some point in the past.  Taking 20 mg for mos. Still problems with sleep.  Down to 2.5 mg Ambien bc when takes more feels out of it the next day.   Thinks taking Ambien too long. Too much awakening. No marked benefit with increased Vyvanse to 60 mg and feels more irritable and edgy. However still distractible and inefficient and hard to finish things. Forgetful and loses things. Plan: Reduce Vyvanse to 50 mg AM continue fluoxetine to 20 mg daily DT her fear of serotonin syndrome.  Thinks she had some of those sx in the past.  May have reduced benefit DT lower dose. Trial Belsomra 15-20 mg or Dayvigo for sleep instead of Ambien bc hangover with 5 mg daily.  11/01/2020 appointment with the following noted: Phone call 08/16/2020:Rtc to patient and she did try the [place] and reports having the "hang over and drowsy" effect. Same as the trazodone.  She is asking to try Lunesta instead.  10/07/2020 she called back again stating she is wanted to go back to Ambien 5 mg. Ambien seems to work again and getting 8 hours now. Re: reduction vyvanse to 50 mg.  Now feels that it's now working well.  Very distractible and not finishing things.  Not affordable to take the biologicals any more and had to stop but pain is not worse. Plan increase Vyvanse to 60 mg daily. For sleep return to Ambien 5 mg nightly because of failures of alternatives.  03/08/2021 appointment with the following noted: Doing good overall.   Taking 7.5 mg Ambien.  Need to sleep and satisfied with it now. Overall in a good place emotionally and mentally.  Satisfied with meds. No SE now. Tolerated Vyvanse 60 fine and  pleased with it.   Prednisone back a couple of weeks ago.  Pain can awaken her also. Pleased to sell property. Plan: no med changes  09/07/2021 appointment with the following noted: Worst couple of mos.   July flair up of arthritis, the worst ever related to stress, the heat and over exertion. Took 5 mos to see new rheumatologist and sees them tomorrow. Got more down with pain. On prednisone it helped.  On 20 mg prednisone had irritability and ADD worse and insomnia.  Forgetful, distractible to marked degree.  Trouble making decisions.  Rewriting lists of things to do wasting time.  Better cognition with less prednisone.  Would rather hurt than be like this. Almost nonfunctional at this time.  Always doing things.   Having BA remodeled.  Sleep 6-7 hours. Plan: Yes trial Namenda off label. continue fluoxetine to 20 mg daily DT her fear of serotonin syndrome.  Thinks she had some of those sx in the past.  May have reduced benefit DT lower dose. Per her request to increase Vyvanse to 70 mg every morning for better benefit for cognition.  11/10/2021 appointment with the following noted: Not on Namenda at this time. New rheum dx CPPD and wondering if she had psoriatic arthritis and wanted to try new drugs. Lives in [place] with a lot of stairs.  At [facility] with more problems with joints.  Worry over new doctor and new dx with FU MRI 2 weeks ago.  Worst flare up ever and took her down mentally.  Pain interfered with sleep.  Missed 2 mos of hair appts.  Couldn't function DT pain.Need to restart prednisone and get injections from Dr. Fransisco Beau yesterday.  Prednisone helped. Thought Namenda helped some but caused dizziness.  Stopped it. Had gotten down to 1/4 Ambien and melatonin but prednisone increased insomnia. Back on 5 mg Ambien. Inflammatory flareup if overworks. Got mild Covid and H Covid with no sx. Health stress and health care issues. Still all over the place.  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  Helping to care for 36-month-old Madeline Young in [place].  Past Psychiatric Medication Trials:  trazodone hangover, Ambien, belsomra, Dayvigo to expensive, melatonin, Benadryl hangover and combos (Lunesta never tried) Xanax hangover Fluoxetine 60, sertraline NR, duloxetine, Lexapro, Wellbutrin, buspirone,  Ritalin [place], Concerta 54, Adderall 20 dizzy, Vyvanse 60  Under care at this office since July 2002   Review of Systems:  Review of Systems  HENT:  Negative for ear pain.   Cardiovascular:  Negative for palpitations.  Musculoskeletal:  Positive for arthralgias, back pain, gait problem and joint swelling.  Skin:  Positive for rash.  Neurological:  Negative for dizziness, tremors and weakness.  Psychiatric/Behavioral:  Positive for decreased concentration and sleep disturbance. Negative for agitation, behavioral problems, confusion, dysphoric mood, hallucinations, self-injury and suicidal ideas. The patient is not nervous/anxious and is not hyperactive.    Medications: I have reviewed the patient's current medications.  Current Outpatient Medications  Medication Sig Dispense Refill   aspirin 81 MG tablet Take 81 mg by mouth daily.     calcium carbonate (OS-CAL - DOSED IN MG OF ELEMENTAL CALCIUM) 1250 (500 Ca) MG tablet      calcium-vitamin D (OSCAL WITH D) 250-125 MG-UNIT tablet Take 1 tablet by mouth daily.     celecoxib (CELEBREX) 200 MG capsule Take 1 capsule (200 mg total) by mouth daily. 90 capsule 0   cholecalciferol (VITAMIN D) 1000 UNITS tablet Take 1,000 Units by mouth daily.     clobetasol (TEMOVATE) 0.05 % external solution      colchicine 0.6 MG tablet Take 0.6 mg by mouth daily.     diclofenac Sodium (VOLTAREN) 1 % GEL APPLY 2 GRAMS TO AFFECTED AREA 4 TIMES A DAY 300 g 3   fluocinonide ointment (LIDEX) 0.05 % APPLY TWICE DAILY TO AFFECTED AREAS UNTIL IMPROVED THEN AS NEEDED FOR FLARES     hydroxychloroquine (PLAQUENIL) 200 MG  tablet Take 400 mg by mouth daily.     ipratropium (ATROVENT) 0.03 % nasal spray Place into both nostrils.     [START ON 01/05/2022] lisdexamfetamine (VYVANSE) 70 MG capsule Take 1 capsule (70 mg total) by mouth daily. 30 capsule 0   MAGNESIUM-ZINC PO Take 1 tablet by mouth daily.     Multiple Vitamin (MULTIVITAMIN) tablet Take 1 tablet by mouth daily.     nystatin-triamcinolone ointment (MYCOLOG) Apply 1 application topically 2 (two) times daily. 30 g 1   Omega-3 Fatty Acids (FISH OIL) 1200 MG CAPS Take 1,200 mg by mouth daily.      oxyCODONE-acetaminophen (PERCOCET/ROXICET) 5-325 MG tablet Take 1-2 tablets by mouth every 8 (eight) hours as needed for moderate pain. 90 tablet 0   Polyethyl Glycol-Propyl Glycol 0.4-0.3 % SOLN Place 1 drop into both eyes daily as needed (for dry eyes).     predniSONE (DELTASONE) 5 MG tablet Take by mouth.     triamcinolone cream (KENALOG) 0.1 % APPLY TO AFFECTED AREA TWICE A DAY UNTIL CLEAR THEN AS NEEDED     Turmeric 500 MG TABS Take 1 tablet by mouth daily.     zolpidem (AMBIEN) 5 MG tablet Take 1 tablet (5 mg total) by mouth at bedtime as needed for sleep. On empty stomach 30 tablet 5   FLUoxetine (PROZAC) 20 MG capsule Take 1 capsule (20 mg total) by mouth daily. 90 capsule 1   lisdexamfetamine (VYVANSE) 70 MG capsule Take 1 capsule (70 mg total) by mouth daily. 30 capsule 0   [START ON 12/08/2021] lisdexamfetamine (VYVANSE) 70 MG capsule Take 1 capsule (70 mg total) by mouth daily. 30 capsule 0   memantine (NAMENDA) 10 MG tablet 1/2 TABLET NIGHTLY FOR 1 WEEK THEN 1/2 IN AM AND 1 IN PM FOR 1 WEEK THEN 1 TWICE DAILY (Patient not taking: Reported on 11/09/2021) 180 tablet 0   No current facility-administered medications for this visit.    Medication Side Effects: None  Allergies:  Allergies  Allergen Reactions   Cosentyx [Secukinumab] Rash    Severe case of psorasis   Sertraline Hcl Other (See Comments)    Made me crazy    Avelox [Moxifloxacin Hcl In Nacl]      Pt cant remember reaction  Nitrofurantoin     Other reaction(s): Other (See Comments) tired Pt cant remember reaction Other reaction(s): Unknown   Rosuvastatin     Other reaction(s): Unknown   Latex Itching and Rash    Redness   Macrobid [Nitrofurantoin Monohyd Macro] Other (See Comments)    tired   Statins Other (See Comments)    Muscle aches    Past Medical History:  Diagnosis Date   Adenoma 10/06/2008   sigmoid 96mm   ADHD (attention deficit hyperactivity disorder)    Allergy    Anxiety    Arthritis    Cataract    bil cateracts removed   Complication of anesthesia    first colonoscopy pt states she woke up   Depression    GERD (gastroesophageal reflux disease)    Globus sensation    Hyperlipidemia    Insomnia    Multinodular goiter (nontoxic)    Psoriatic arthritis ([place])    Spinal stenosis of lumbar region June 2016   MRI    Statin intolerance 01/27/2013    Family History  Problem Relation Age of Onset   Heart disease Mother    Hyperlipidemia Mother    Coronary artery disease Father 33       CABG in early 40's   Hyperlipidemia Father    Epilepsy Grandchild        Severe form   Colon cancer Neg Hx    Esophageal cancer Neg Hx    Rectal cancer Neg Hx    Stomach cancer Neg Hx     Social History   Socioeconomic History   Marital status: Married    Spouse name: Not on file   Number of children: 2   Years of education: Not on file   Highest education level: Not on file  Occupational History   Occupation: disability  Tobacco Use   Smoking status: Former    Types: Cigarettes    Quit date: 08/21/1971    Years since quitting: 50.2   Smokeless tobacco: Never  Vaping Use   Vaping Use: Never used  Substance and Sexual Activity   Alcohol use: Yes    Alcohol/week: 7.0 standard drinks    Types: 7 Glasses of wine per week   Drug use: No   Sexual activity: Not on file  Other Topics Concern   Not on file  Social History Narrative   Married, retired  Therapist, sports   2 daughters some grandchildren   1 caffeinated drinks daily   1 alcoholic beverage daily   No tobacco      Social Determinants of Radio broadcast assistant Strain: Low Risk    Difficulty of Paying Living Expenses: Not hard at all  Food Insecurity: No Food Insecurity   Worried About Charity fundraiser in the Last Year: Never true   Arboriculturist in the Last Year: Never true  Transportation Needs: No Transportation Needs   Lack of Transportation (Medical): No   Lack of Transportation (Non-Medical): No  Physical Activity: Insufficiently Active   Days of Exercise per Week: 2 days   Minutes of Exercise per Session: 50 min  Stress: No Stress Concern Present   Feeling of Stress : Not at all  Social Connections: Unknown   Frequency of Communication with Friends and Family: Not on file   Frequency of Social Gatherings with Friends and Family: Not on file   Attends Religious Services: Not on file   Active Member of Clubs or Organizations: Not on file  Attends Archivist Meetings: Not on file   Marital Status: Married  Human resources officer Violence: Not At Risk   Fear of Current or Ex-Partner: No   Emotionally Abused: No   Physically Abused: No   Sexually Abused: No    Past Medical History, Surgical history, Social history, and Family history were reviewed and updated as appropriate.   Please see review of systems for further details on the patient's review from today.   Objective:   Physical Exam:  BP 121/74    Pulse 85   Physical Exam Constitutional:      General: She is not in acute distress.    Appearance: Normal appearance. She is well-developed.  Musculoskeletal:        General: No deformity.  Neurological:     Mental Status: She is alert and oriented to person, place, and time.     Motor: No tremor.     Coordination: Coordination normal.     Gait: Gait normal.  Psychiatric:        Attention and Perception: Perception normal. She is inattentive.         Mood and Affect: Mood is anxious. Mood is not depressed. Affect is not labile, blunt, angry or inappropriate.        Speech: Speech normal.        Behavior: Behavior normal.        Thought Content: Thought content normal. Thought content is not delusional. Thought content does not include homicidal or suicidal ideation. Thought content does not include suicidal plan.        Cognition and Memory: Cognition normal.        Judgment: Judgment normal.     Comments: Insight intact. No auditory or visual hallucinations. No delusions.  Talkative per usual. Mood good.    Lab Review:     Component Value Date/Time   NA 138 11/09/2021 1141   K 4.1 11/09/2021 1141   CL 102 11/09/2021 1141   CO2 32 11/09/2021 1141   GLUCOSE 127 (H) 11/09/2021 1141   BUN 24 (H) 11/09/2021 1141   CREATININE 0.72 11/09/2021 1141   CALCIUM 9.5 11/09/2021 1141   PROT 6.8 11/09/2021 1141   ALBUMIN 4.5 11/09/2021 1141   AST 17 11/09/2021 1141   ALT 17 11/09/2021 1141   ALKPHOS 47 11/09/2021 1141   BILITOT 0.4 11/09/2021 1141   GFRNONAA >60 02/18/2016 0322   GFRAA >60 02/18/2016 0322       Component Value Date/Time   WBC 4.6 08/07/2019 1159   RBC 3.96 08/07/2019 1159   HGB 13.0 08/07/2019 1159   HCT 38.3 08/07/2019 1159   PLT 238.0 08/07/2019 1159   MCV 96.6 08/07/2019 1159   MCH 31.0 02/18/2016 0322   MCHC 33.9 08/07/2019 1159   RDW 13.0 08/07/2019 1159   LYMPHSABS 2.1 08/07/2019 1159   MONOABS 0.4 08/07/2019 1159   EOSABS 0.1 08/07/2019 1159   BASOSABS 0.0 08/07/2019 1159    No results found for: POCLITH, LITHIUM   No results found for: PHENYTOIN, PHENOBARB, VALPROATE, CBMZ   .res Assessment: Plan:    Hildy was seen today for follow-up, adhd, depression and stress.  Diagnoses and all orders for this visit:  Attention deficit hyperactivity disorder (ADHD), predominantly inattentive type -     lisdexamfetamine (VYVANSE) 70 MG capsule; Take 1 capsule (70 mg total) by mouth daily. -      lisdexamfetamine (VYVANSE) 70 MG capsule; Take 1 capsule (70 mg total) by mouth daily. -  lisdexamfetamine (VYVANSE) 70 MG capsule; Take 1 capsule (70 mg total) by mouth daily.  Insomnia due to mental condition  Major depression, recurrent, full remission (HCC) -     FLUoxetine (PROZAC) 20 MG capsule; Take 1 capsule (20 mg total) by mouth daily.  psoriatic arthritis  Greater than 50% of 30 min face to face time with patient was spent on counseling and coordination of care. We discussed the following.    Discussed potential benefits, risks, and side effects of stimulants with patient to include increased heart rate, palpitations, insomnia, increased anxiety, increased irritability, or decreased appetite.  Instructed patient to contact office if experiencing any significant tolerability issues.  Disc risk steroids affecting mood and sleep.  Supportive therapy dealing with stubborn H with memory problems. Disc  rheum consultation and stress from dealing with it.    tolerating Vyvanse 70 mg AM.  Consider further increase if tolerated.  Consider memantine off label because of chronic poorly controlled ADHD. Option [place] Option retry lower dose Namenda off label.  continue fluoxetine to 20 mg daily DT her fear of serotonin syndrome.  Thinks she had some of those sx in the past.  May have reduced benefit DT lower dose.  LED Ambien 5-10 mg HS.   Disc SE.  Better sleep with less pain.  If it fails then try Lunesta  Doesn't want new meds with all the inflammation.  Consider alternative treatments for ADD including off label.  FU 6 mos.  Lynder Parents, MD, DFAPA   Please see After Visit Summary for patient specific instructions.  Future Appointments  Date Time Provider [place]  11/14/2021  2:50 PM [facility] [place] 2 [facility]  11/14/2021  3:00 PM [facility] [place] 3 [facility]  02/08/2022 10:30 AM Tullo, Aris Everts, MD [id number]    No orders of the  defined types were placed in this encounter.      -------------------------------

## 2021-11-14 ENCOUNTER — Ambulatory Visit: Payer: Medicare HMO

## 2021-11-14 ENCOUNTER — Ambulatory Visit
Admission: RE | Admit: 2021-11-14 | Discharge: 2021-11-14 | Disposition: A | Discharge status: Home / Self Care | Payer: Medicare HMO | Source: Ambulatory Visit | Attending: Internal Medicine | Admitting: Internal Medicine

## 2021-11-14 DIAGNOSIS — R928 Other abnormal and inconclusive findings on diagnostic imaging of breast: Secondary | ICD-10-CM

## 2021-11-14 DIAGNOSIS — R922 Inconclusive mammogram: Secondary | ICD-10-CM | POA: Diagnosis not present

## 2021-11-29 ENCOUNTER — Other Ambulatory Visit: Payer: Medicare HMO

## 2021-12-08 ENCOUNTER — Other Ambulatory Visit: Payer: Self-pay | Admitting: Psychiatry

## 2021-12-08 DIAGNOSIS — F900 Attention-deficit hyperactivity disorder, predominantly inattentive type: Secondary | ICD-10-CM

## 2022-01-02 DIAGNOSIS — M159 Polyosteoarthritis, unspecified: Secondary | ICD-10-CM | POA: Diagnosis not present

## 2022-01-02 DIAGNOSIS — L4050 Arthropathic psoriasis, unspecified: Secondary | ICD-10-CM | POA: Diagnosis not present

## 2022-01-02 DIAGNOSIS — M1120 Other chondrocalcinosis, unspecified site: Secondary | ICD-10-CM | POA: Diagnosis not present

## 2022-01-10 ENCOUNTER — Other Ambulatory Visit: Payer: Self-pay | Admitting: Internal Medicine

## 2022-01-24 ENCOUNTER — Other Ambulatory Visit: Payer: Self-pay | Admitting: Internal Medicine

## 2022-01-24 MED ORDER — OXYCODONE-ACETAMINOPHEN 5-325 MG PO TABS: 1.0000 | ORAL_TABLET | Freq: Three times a day (TID) | ORAL | 0 refills | Status: DC | PRN

## 2022-01-24 NOTE — Telephone Encounter (Signed)
Pt called in requesting refill on medication (oxyCODONE-acetaminophen (PERCOCET/ROXICET) 5-325 MG tablet)... Pt requesting callback 

## 2022-01-24 NOTE — Telephone Encounter (Signed)
Refilled: 11/09/2021 ?Last OV: 11/09/2021 ?Next OV: 01/31/2022 ?

## 2022-01-31 ENCOUNTER — Encounter: Payer: Self-pay | Admitting: Internal Medicine

## 2022-01-31 ENCOUNTER — Ambulatory Visit (INDEPENDENT_AMBULATORY_CARE_PROVIDER_SITE_OTHER): Payer: Medicare HMO | Admitting: Internal Medicine

## 2022-01-31 VITALS — BP 128/70 | HR 94 | Temp 98.5°F | Ht 66.0 in | Wt 163.2 lb

## 2022-01-31 DIAGNOSIS — M5431 Sciatica, right side: Secondary | ICD-10-CM | POA: Diagnosis not present

## 2022-01-31 DIAGNOSIS — Z1211 Encounter for screening for malignant neoplasm of colon: Secondary | ICD-10-CM | POA: Diagnosis not present

## 2022-01-31 DIAGNOSIS — R7301 Impaired fasting glucose: Secondary | ICD-10-CM

## 2022-01-31 DIAGNOSIS — L4052 Psoriatic arthritis mutilans: Secondary | ICD-10-CM | POA: Diagnosis not present

## 2022-01-31 DIAGNOSIS — E7849 Other hyperlipidemia: Secondary | ICD-10-CM | POA: Diagnosis not present

## 2022-01-31 DIAGNOSIS — M5432 Sciatica, left side: Secondary | ICD-10-CM | POA: Diagnosis not present

## 2022-01-31 DIAGNOSIS — Z79899 Other long term (current) drug therapy: Secondary | ICD-10-CM

## 2022-01-31 LAB — LIPID PANEL
Cholesterol: 333 mg/dL — ABNORMAL HIGH (ref 0–200)
HDL: 80.6 mg/dL (ref >39.00)
LDL Cholesterol: 234 mg/dL — ABNORMAL HIGH (ref 0–99)
NonHDL: 252.11
Total CHOL/HDL Ratio: 4
Triglycerides: 89 mg/dL (ref 0.0–149.0)
VLDL: 17.8 mg/dL (ref 0.0–40.0)

## 2022-01-31 LAB — CBC WITH DIFFERENTIAL/PLATELET
Basophils Absolute: 0 10*3/uL (ref 0.0–0.1)
Basophils Relative: 0.5 % (ref 0.0–3.0)
Eosinophils Absolute: 0 10*3/uL (ref 0.0–0.7)
Eosinophils Relative: 0.4 % (ref 0.0–5.0)
HCT: 36.7 % (ref 36.0–46.0)
Hemoglobin: 12.3 g/dL (ref 12.0–15.0)
Lymphocytes Relative: 14.9 % (ref 12.0–46.0)
Lymphs Abs: 0.8 10*3/uL (ref 0.7–4.0)
MCHC: 33.6 g/dL (ref 30.0–36.0)
MCV: 97.4 fl (ref 78.0–100.0)
Monocytes Absolute: 0.3 10*3/uL (ref 0.1–1.0)
Monocytes Relative: 5.8 % (ref 3.0–12.0)
Neutro Abs: 4.3 10*3/uL (ref 1.4–7.7)
Neutrophils Relative %: 78.4 % — ABNORMAL HIGH (ref 43.0–77.0)
Platelets: 233 10*3/uL (ref 150.0–400.0)
RBC: 3.77 Mil/uL — ABNORMAL LOW (ref 3.87–5.11)
RDW: 13.2 % (ref 11.5–15.5)
WBC: 5.4 10*3/uL (ref 4.0–10.5)

## 2022-01-31 LAB — LDL CHOLESTEROL, DIRECT: Direct LDL: 223 mg/dL

## 2022-01-31 LAB — MICROALBUMIN / CREATININE URINE RATIO
Creatinine,U: 133.1 mg/dL
Microalb Creat Ratio: 1.3 mg/g (ref 0.0–30.0)
Microalb, Ur: 1.7 mg/dL (ref 0.0–1.9)

## 2022-01-31 LAB — HEMOGLOBIN A1C: Hgb A1c MFr Bld: 6.1 % (ref 4.6–6.5)

## 2022-01-31 LAB — TSH: TSH: 0.96 u[IU]/mL (ref 0.35–5.50)

## 2022-01-31 MED ORDER — OXYCODONE-ACETAMINOPHEN 5-325 MG PO TABS: 1.0000 | ORAL_TABLET | Freq: Three times a day (TID) | ORAL | 0 refills | Status: DC | PRN

## 2022-01-31 NOTE — Assessment & Plan Note (Signed)
Reviewed previous trials of statins and repatha as well as her 2019 Cardiac cT which had a calcium score of zero.  ? ?Lab Results  ?Component Value Date  ? CHOL 333 (H) 01/31/2022  ? HDL 80.60 01/31/2022  ? LDLCALC 234 (H) 01/31/2022  ? LDLDIRECT 223.0 01/31/2022  ? TRIG 89.0 01/31/2022  ? CHOLHDL 4 01/31/2022  ? ? ?

## 2022-01-31 NOTE — Assessment & Plan Note (Signed)
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  Her pain is not relieved with NSAIDs or recent ESI. .   Management of her pain requires  refill on narcotics for three times daily   use . Marland Kitchen Refill history confirmed via [place] Controlled Substance database, accessed by me today.. she will be out of town on May 18 ,  The time of her next authorized refill. .  Prescription sent in for 72 tablets for refill on or after May 12  ?

## 2022-01-31 NOTE — Assessment & Plan Note (Addendum)
She has been reevaluated by [facility].  MRI and plain films reviewed.   Her Regimen changed to lefluonamide 20 mg daily which she is tolerating ,   with a planned prednisone wean by 1 mg /month starting May 1  ?

## 2022-01-31 NOTE — Progress Notes (Signed)
? ?Subjective:  ?Patient ID: Madeline Young, female    DOB: [birth year]  Age: 66 y.o. MRN: [id number] ? ?CC: The primary encounter diagnosis was Familial hyperlipidemia. 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 ? ? ?This visit occurred during the SARS-CoV-2 public health emergency.  Safety protocols were in place, including screening questions prior to the visit, additional usage of staff PPE, and extensive cleaning of exam room while observing appropriate contact time as indicated for disinfecting solutions.   ? ?HPI ?Madeline Young presents for  ?Chief Complaint  ?Patient presents with  ? Follow-up  ?  3 month follow up for medication refills  ? ? ?1) inflammatory arthritis:  reviewed recent [facility] rheumatologist note Lind Covert) who reviewed her  MRI resulted in   changes to regimen as follows: "Stop colchicine. Start leflunomide 20 mg daily.  after 1 month, start weaning prednisone by 1 mg every 4 weeks " ? ?Still taking 5 mg prednisone ,  tolerating leflunomide so far . started April 1.   Daughter  had c section  at 79 weeks  in [place]   baby was in NICU 2 weeks.   4 lbs 15 oz  lbs at birth ,  feeing well and gaining weight finally  ? ?2) bilateral hip steroid injections Feb 1 .  Improved pain control.  Still having increased pain  and swelling of right hand .  Moving to [place] starting next week for  a three month stay to help out with new granddaughter .  [place] [facility] [place]  [place] in [place] For May 18 refill.  Going to [place] May 15 through May 21 .  Will need early refill but will be in [place] for that refill.   ? ?4) lost a front tooth, required an implant  cost $4000.   ? ? ?Outpatient Medications Prior to Visit  ?Medication Sig Dispense Refill  ? aspirin 81 MG tablet Take 81 mg  by mouth daily.    ? calcium carbonate (OS-CAL - DOSED IN MG OF ELEMENTAL CALCIUM) 1250 (500 Ca) MG tablet     ? calcium-vitamin D (OSCAL WITH D) 250-125 MG-UNIT tablet Take 1 tablet by mouth daily.    ? celecoxib (CELEBREX) 200 MG capsule Take 1 capsule by mouth once daily 90 capsule 0  ? cholecalciferol (VITAMIN D) 1000 UNITS tablet Take 1,000 Units by mouth daily.    ? clobetasol (TEMOVATE) 0.05 % external solution     ? colchicine 0.6 MG tablet Take 0.6 mg by mouth daily.    ? diclofenac Sodium (VOLTAREN) 1 % GEL APPLY 2 GRAMS TO AFFECTED AREA 4 TIMES A DAY 300 g 3  ? fluocinonide ointment (LIDEX) 0.05 % APPLY TWICE DAILY TO AFFECTED AREAS UNTIL IMPROVED THEN AS NEEDED FOR FLARES    ? FLUoxetine (PROZAC) 20 MG capsule Take 1 capsule (20 mg total) by mouth daily. 90 capsule 1  ? hydroxychloroquine (PLAQUENIL) 200 MG tablet Take 400 mg by mouth daily.    ? ipratropium (ATROVENT) 0.03 % nasal spray Place into both nostrils.    ? leflunomide (ARAVA) 20 MG tablet Take 20 mg by mouth daily.    ? lisdexamfetamine (VYVANSE) 70 MG capsule Take 1 capsule (70 mg total) by mouth daily. 30 capsule 0  ? lisdexamfetamine (VYVANSE) 70 MG capsule Take 1  capsule (70 mg total) by mouth daily. 30 capsule 0  ? lisdexamfetamine (VYVANSE) 70 MG capsule Take 1 capsule (70 mg total) by mouth daily. 30 capsule 0  ? MAGNESIUM-ZINC PO Take 1 tablet by mouth daily.    ? memantine (NAMENDA) 10 MG tablet Take 1 tablet (10 mg total) by mouth 2 (two) times daily. (Patient not taking: Reported on 01/31/2022) 180 tablet 0  ? Multiple Vitamin (MULTIVITAMIN) tablet Take 1 tablet by mouth daily.    ? nystatin-triamcinolone ointment (MYCOLOG) Apply 1 application topically 2 (two) times daily. 30 g 1  ? Omega-3 Fatty Acids (FISH OIL) 1200 MG CAPS Take 1,200 mg by mouth daily.     ? Polyethyl Glycol-Propyl Glycol 0.4-0.3 % SOLN Place 1 drop into both eyes daily as needed (for dry eyes).    ? predniSONE (DELTASONE) 5 MG tablet Take by mouth.    ?  triamcinolone cream (KENALOG) 0.1 % APPLY TO AFFECTED AREA TWICE A DAY UNTIL CLEAR THEN AS NEEDED    ? Turmeric 500 MG TABS Take 1 tablet by mouth daily.    ? zolpidem (AMBIEN) 5 MG tablet Take 1 tablet (5 mg total) by mouth at bedtime as needed for sleep. On empty stomach 30 tablet 5  ? oxyCODONE-acetaminophen (PERCOCET/ROXICET) 5-325 MG tablet Take 1-2 tablets by mouth every 8 (eight) hours as needed for moderate pain. 90 tablet 0  ? ?No facility-administered medications prior to visit.  ? ? ?Review of Systems; ? ?Patient denies headache, fevers, malaise, unintentional weight loss, skin rash, eye pain, sinus congestion and sinus pain, sore throat, dysphagia,  hemoptysis , cough, dyspnea, wheezing, chest pain, palpitations, orthopnea, edema, abdominal pain, nausea, melena, diarrhea, constipation, flank pain, dysuria, hematuria, urinary  Frequency, nocturia, numbness, tingling, seizures,  Focal weakness, Loss of consciousness,  Tremor, insomnia, depression, anxiety, and suicidal ideation.   ? ? ? ?Objective:  ?BP 128/70 (BP Location: Left Arm, Patient Position: Sitting, Cuff Size: Normal)   Pulse 94   Temp 98.5 ?F (36.9 ?C) (Oral)   Ht '5\' 6"'$  (1.676 m)   Wt 163 lb 3.2 oz (74 kg)   SpO2 99%   BMI 26.34 kg/m?  ? ?BP Readings from Last 3 Encounters:  ?01/31/22 128/70  ?11/09/21 118/70  ?08/17/21 (!) 156/62  ? ? ?Wt Readings from Last 3 Encounters:  ?01/31/22 163 lb 3.2 oz (74 kg)  ?11/09/21 168 lb (76.2 kg)  ?08/17/21 170 lb 9.6 oz (77.4 kg)  ? ? ?General appearance: alert, cooperative and appears stated age ?Ears: normal TM's and external ear canals both ears ?Throat: lips, mucosa, and tongue normal; teeth and gums normal ?Neck: no adenopathy, no carotid bruit, supple, symmetrical, trachea midline and thyroid not enlarged, symmetric, no tenderness/mass/nodules ?Back: symmetric, no curvature. ROM normal. No CVA tenderness. ?Lungs: clear to auscultation bilaterally ?Heart: regular rate and rhythm, S1, S2 normal, no  murmur, click, rub or gallop ?Abdomen: soft, non-tender; bowel sounds normal; no masses,  no organomegaly ?Pulses: 2+ and symmetric ?Skin: Skin color, texture, turgor normal. No rashes or lesions ?Lymph nodes: Cervical, supraclavicular, and axillary nodes normal. ? ?Lab Results  ?Component Value Date  ? HGBA1C 6.1 01/31/2022  ? ? ?Lab Results  ?Component Value Date  ? CREATININE 0.72 11/09/2021  ? CREATININE 0.72 02/09/2021  ? CREATININE 0.68 08/09/2020  ? ? ?Lab Results  ?Component Value Date  ? WBC 5.4 01/31/2022  ? HGB 12.3 01/31/2022  ? HCT 36.7 01/31/2022  ? PLT 233.0 01/31/2022  ? GLUCOSE 127 (H)  11/09/2021  ? CHOL 333 (H) 01/31/2022  ? TRIG 89.0 01/31/2022  ? HDL 80.60 01/31/2022  ? LDLDIRECT 223.0 01/31/2022  ? LDLCALC 234 (H) 01/31/2022  ? ALT 17 11/09/2021  ? AST 17 11/09/2021  ? NA 138 11/09/2021  ? K 4.1 11/09/2021  ? CL 102 11/09/2021  ? CREATININE 0.72 11/09/2021  ? BUN 24 (H) 11/09/2021  ? CO2 32 11/09/2021  ? TSH 0.96 01/31/2022  ? HGBA1C 6.1 01/31/2022  ? MICROALBUR 1.7 01/31/2022  ? ? ?MM DIAG BREAST TOMO UNI LEFT ? ?Result Date: 11/14/2021 ?CLINICAL DATA:  66 year old female presenting as a recall from screening for possible left breast asymmetry. EXAM: DIGITAL DIAGNOSTIC UNILATERAL LEFT MAMMOGRAM WITH TOMOSYNTHESIS AND CAD TECHNIQUE: Left digital diagnostic mammography and breast tomosynthesis was performed. The images were evaluated with computer-aided detection. COMPARISON:  Previous exam(s). ACR Breast Density Category b: There are scattered areas of fibroglandular density. FINDINGS: Full field cc and mL as well as spot compression tomosynthesis CC views of the left breast were performed for a questioned asymmetry seen only cc view in the outer anterior left breast. On the additional imaging the asymmetry resolves and most likely represented normal overlapping fibroglandular tissue. There is no mass or distortion. There are no new findings elsewhere in the left breast. IMPRESSION: No  mammographic evidence of malignancy in the left breast. RECOMMENDATION: Screening mammogram in one year.(Code:SM-B-01Y) I have discussed the findings and recommendations with the patient. If applicable, a reminder letter wi

## 2022-02-06 ENCOUNTER — Telehealth: Payer: Self-pay | Admitting: Psychiatry

## 2022-02-06 ENCOUNTER — Other Ambulatory Visit: Payer: Self-pay | Admitting: Psychiatry

## 2022-02-06 DIAGNOSIS — F900 Attention-deficit hyperactivity disorder, predominantly inattentive type: Secondary | ICD-10-CM

## 2022-02-06 MED ORDER — LISDEXAMFETAMINE DIMESYLATE 70 MG PO CAPS: 70.0000 mg | ORAL_CAPSULE | Freq: Every day | ORAL | 0 refills | Status: DC

## 2022-02-06 NOTE — Telephone Encounter (Signed)
Pt called no Rx on file for Vyvanse 70 mg 1/d. Need to pick up today. Going out of town in the morning. Was not aware Rx not on file. [facility] [place]  ?

## 2022-02-08 ENCOUNTER — Ambulatory Visit: Payer: Medicare HMO | Admitting: Internal Medicine

## 2022-02-28 ENCOUNTER — Telehealth: Payer: Self-pay | Admitting: Internal Medicine

## 2022-02-28 NOTE — Telephone Encounter (Signed)
Patient will call the first week of June to schedule procedure for august.

## 2022-02-28 NOTE — Telephone Encounter (Signed)
Hello Dr. Carlean Purl,  Patient called to schedule recall colonoscopy. Patient states she still is having fecal incontinence. Do you recommend patient set up an OV prior to scheduling colonoscopy to discuss the incontinence or is it okay to schedule recall without OV?  Thank you!

## 2022-02-28 NOTE — Telephone Encounter (Signed)
Please schedule the colonoscopy and tell her to discuss with me then

## 2022-03-14 DIAGNOSIS — M159 Polyosteoarthritis, unspecified: Secondary | ICD-10-CM | POA: Diagnosis not present

## 2022-03-14 DIAGNOSIS — L4050 Arthropathic psoriasis, unspecified: Secondary | ICD-10-CM | POA: Diagnosis not present

## 2022-03-15 ENCOUNTER — Encounter: Payer: Self-pay | Admitting: Internal Medicine

## 2022-04-05 ENCOUNTER — Telehealth: Payer: Self-pay | Admitting: Psychiatry

## 2022-04-05 ENCOUNTER — Other Ambulatory Visit: Payer: Self-pay | Admitting: Internal Medicine

## 2022-04-05 ENCOUNTER — Other Ambulatory Visit: Payer: Self-pay

## 2022-04-05 DIAGNOSIS — F900 Attention-deficit hyperactivity disorder, predominantly inattentive type: Secondary | ICD-10-CM

## 2022-04-05 MED ORDER — LISDEXAMFETAMINE DIMESYLATE 70 MG PO CAPS: 70.0000 mg | ORAL_CAPSULE | Freq: Every day | ORAL | 0 refills | Status: DC

## 2022-04-05 NOTE — Telephone Encounter (Signed)
Next visit 05/15/22. Madeline Young called requesting a refill on her Vyvanse 70 mg called [facility] pharmacy[facility]   [facility] [address], [place],  [address]. Phone number is [phone].

## 2022-04-05 NOTE — Telephone Encounter (Signed)
Pended.

## 2022-04-06 DIAGNOSIS — N3000 Acute cystitis without hematuria: Secondary | ICD-10-CM | POA: Diagnosis not present

## 2022-04-12 ENCOUNTER — Ambulatory Visit: Payer: Medicare HMO | Admitting: Psychiatry

## 2022-04-28 ENCOUNTER — Ambulatory Visit: Payer: Medicare HMO | Admitting: Internal Medicine

## 2022-05-01 ENCOUNTER — Ambulatory Visit: Payer: Medicare HMO | Admitting: Internal Medicine

## 2022-05-11 ENCOUNTER — Telehealth: Payer: Self-pay | Admitting: Internal Medicine

## 2022-05-11 ENCOUNTER — Telehealth: Payer: Self-pay | Admitting: Psychiatry

## 2022-05-11 ENCOUNTER — Telehealth: Payer: Self-pay | Admitting: *Deleted

## 2022-05-11 ENCOUNTER — Ambulatory Visit (AMBULATORY_SURGERY_CENTER): Payer: Self-pay | Admitting: *Deleted

## 2022-05-11 VITALS — Ht 66.0 in | Wt 163.4 lb

## 2022-05-11 DIAGNOSIS — Z86010 Personal history of colonic polyps: Secondary | ICD-10-CM

## 2022-05-11 MED ORDER — OXYCODONE-ACETAMINOPHEN 5-325 MG PO TABS: 1.0000 | ORAL_TABLET | Freq: Three times a day (TID) | ORAL | 0 refills | Status: DC | PRN

## 2022-05-11 NOTE — Telephone Encounter (Signed)
Pt need refill on [facility]

## 2022-05-11 NOTE — Telephone Encounter (Signed)
[facility] [place] [facility] of [facility]se 70 mg. They have #20 Vyvanse 60 mg. Pt asking to send Rx for those #20  60 mg until the 70 mg comes in. Pt # [phone] Apt 8/7

## 2022-05-11 NOTE — Telephone Encounter (Signed)
Okay to proceed with colonoscopy and I will address this when she comes

## 2022-05-11 NOTE — Telephone Encounter (Signed)
Please review

## 2022-05-11 NOTE — Telephone Encounter (Signed)
Pt. In for pre-visit today procedure scheduled 05/25/22,she wanted me to let you know that at times she has episodes of bowel incontinence,she stated that she came into the office and mentioned to you in 2021,please advise if need OV or to proceed with scheduled procedure.

## 2022-05-11 NOTE — Procedures (Signed)
No egg or soy allergy known to patient  No issues known to pt with past sedation with any surgeries or procedures Patient denies ever being told they had issues or difficulty with intubation  No FH of Malignant Hyperthermia Pt is not on diet pills Pt is not on  home 02  Pt is not on blood thinners  Pt denies issues with constipation  No A fib or A flutter Have any cardiac testing pending--no Pt instructed to use [email or web address] or GoodRx for a price reduction on prep   Episodes of incontinence @ times

## 2022-05-12 ENCOUNTER — Other Ambulatory Visit: Payer: Self-pay | Admitting: Psychiatry

## 2022-05-12 MED ORDER — LISDEXAMFETAMINE DIMESYLATE 60 MG PO CAPS: 60.0000 mg | ORAL_CAPSULE | ORAL | 0 refills | Status: DC

## 2022-05-12 NOTE — Telephone Encounter (Signed)
I didn't see this yesterday so they are probably gone now.  Pls call pharmacy and see if they have any 60 mg or 70 mg capsules now. thx

## 2022-05-15 ENCOUNTER — Encounter: Payer: Self-pay | Admitting: Psychiatry

## 2022-05-15 ENCOUNTER — Ambulatory Visit (INDEPENDENT_AMBULATORY_CARE_PROVIDER_SITE_OTHER): Payer: Medicare HMO | Admitting: Psychiatry

## 2022-05-15 VITALS — BP 127/71 | HR 84

## 2022-05-15 DIAGNOSIS — F3342 Major depressive disorder, recurrent, in full remission: Secondary | ICD-10-CM

## 2022-05-15 DIAGNOSIS — F900 Attention-deficit hyperactivity disorder, predominantly inattentive type: Secondary | ICD-10-CM | POA: Diagnosis not present

## 2022-05-15 DIAGNOSIS — F5105 Insomnia due to other mental disorder: Secondary | ICD-10-CM | POA: Diagnosis not present

## 2022-05-15 DIAGNOSIS — R69 Illness, unspecified: Secondary | ICD-10-CM | POA: Diagnosis not present

## 2022-05-15 MED ORDER — LISDEXAMFETAMINE DIMESYLATE 70 MG PO CAPS: 70.0000 mg | ORAL_CAPSULE | Freq: Every day | ORAL | 0 refills | Status: DC

## 2022-05-15 NOTE — Progress Notes (Signed)
Madeline Young [id number] Nov 09, 1955 66 y.o.   Subjective:   Patient ID:  Madeline Young is a 66 y.o. (DOB [birth year]) female.  Chief Complaint:  Chief Complaint  Patient presents with   Follow-up   ADHD   Depression    Depression        Associated symptoms include decreased concentration.  Associated symptoms include no suicidal ideas.  Madeline Young presents to the office today for follow-up of ADD and anxiety and depression and sleep.  seen August 2020.  No meds were changed.  On Vyvanse 50, fluoxetine 20, and Ambien 5.  seen November 30, 2019.  The following was noted: "Down".  Doesn't feel Vyvanse helping as much.  More scattered and doesn't finish things.  No SE with it. Sleep pattern is worse as Covid progressed.  Had reduced Zolpidem to 5 mg but then increased to 7.5 mg HS.  If takes Benadryl but can sleep but then has crying spells after it and hangover.  In the AM feels more negative and tearful.  AM is hard bc stiff in the morning. Plan:Continue Vyvanse 50 mg AM Increase fluoxetine to 30 mg daily.  She doesn't want to go to 40 bc fear of serotonin syndrome.  Thinks she had some of those sx in the past Continue zolpidem 5 mg daily.  01/29/20 appt, reported:  Better with increased fluoxetine re: mood. No SE with meds. Still ADD problems not as well controlled as she'd like. Still problems with sleep and doesn't think ambien workiing as well as it should.  Tried trazodone with Ambien but had hangover and difficulty waking up and not a deep good sleep.  Never tried trazodone alone.  Increased Ambien on her own to 10 mg and ran out early.  Reduced to 5 mg and added Benadryl.  Erratic sleep since Covid.  Go to bed same time 10.  Avoids alcohol 2 hours before. Primary problem is going to sleep.  History of 10 mg Ambien for years.  Says 7.5 mg Ambien will work. Caffeine none after noon. Plan: Increase Vyvanse 60 mg AM continue fluoxetine to 30 mg daily.  She  doesn't want to go to 40 bc fear of serotonin syndrome.  Thinks she had some of those sx in the past.  It helped to increase Increase zolpidem 5-10 mg daily.  08/02/2020 appointment with the following noted: Went back down to 20 mg fluoxetine bc of more HA and fear of serotonin syndrome bc she feels she had it at some point in the past.  Taking 20 mg for mos. Still problems with sleep.  Down to 2.5 mg Ambien bc when takes more feels out of it the next day.   Thinks taking Ambien too long. Too much awakening. No marked benefit with increased Vyvanse to 60 mg and feels more irritable and edgy. However still distractible and inefficient and hard to finish things. Forgetful and loses things. Plan: Reduce Vyvanse to 50 mg AM continue fluoxetine to 20 mg daily DT her fear of serotonin syndrome.  Thinks she had some of those sx in the past.  May have reduced benefit DT lower dose. Trial Belsomra 15-20 mg or Dayvigo for sleep instead of Ambien bc hangover with 5 mg daily.  11/01/2020 appointment with the following noted: Phone call 08/16/2020:Rtc to patient and she did try the [place] and reports having the "hang over and drowsy" effect. Same as the trazodone.  She is asking to try Lunesta instead.  10/07/2020 she  called back again stating she is wanted to go back to Ambien 5 mg. Ambien seems to work again and getting 8 hours now. Re: reduction vyvanse to 50 mg.  Now feels that it's now working well.  Very distractible and not finishing things.  Not affordable to take the biologicals any more and had to stop but pain is not worse. Plan increase Vyvanse to 60 mg daily. For sleep return to Ambien 5 mg nightly because of failures of alternatives.  03/08/2021 appointment with the following noted: Doing good overall.   Taking 7.5 mg Ambien.  Need to sleep and satisfied with it now. Overall in a good place emotionally and mentally.  Satisfied with meds. No SE now. Tolerated Vyvanse 60 fine and pleased  with it.   Prednisone back a couple of weeks ago.  Pain can awaken her also. Pleased to sell property. Plan: no med changes  09/07/2021 appointment with the following noted: Worst couple of mos.   July flair up of arthritis, the worst ever related to stress, the heat and over exertion. Took 5 mos to see new rheumatologist and sees them tomorrow. Got more down with pain. On prednisone it helped.  On 20 mg prednisone had irritability and ADD worse and insomnia.  Forgetful, distractible to marked degree.  Trouble making decisions.  Rewriting lists of things to do wasting time.  Better cognition with less prednisone.  Would rather hurt than be like this. Almost nonfunctional at this time.  Always doing things.   Having BA remodeled.  Sleep 6-7 hours. Plan: Yes trial Namenda off label. continue fluoxetine to 20 mg daily DT her fear of serotonin syndrome.  Thinks she had some of those sx in the past.  May have reduced benefit DT lower dose. Per her request to increase Vyvanse to 70 mg every morning for better benefit for cognition.  11/10/2021 appointment with the following noted: Not on Namenda at this time. New rheum dx CPPD and wondering if she had psoriatic arthritis and wanted to try new drugs. Lives in [place] with a lot of stairs.  At [facility] with more problems with joints.  Worry over new doctor and new dx with FU MRI 2 weeks ago.  Worst flare up ever and took her down mentally.  Pain interfered with sleep.  Missed 2 mos of hair appts.  Couldn't function DT pain.Need to restart prednisone and get injections from Dr. Fransisco Beau yesterday.  Prednisone helped. Thought Namenda helped some but caused dizziness.  Stopped it. Had gotten down to 1/4 Ambien and melatonin but prednisone increased insomnia. Back on 5 mg Ambien. Inflammatory flareup if overworks. Got mild Covid and H Covid with no sx. Health stress and health care issues. Still all over the place. Plan no med chages  05/15/2022  appointment noted: Taking Vyvanse 60 bc can't get 70's Overall doing well lately.  [place]. GD [place] early [place] and she went to help them.  Born at 34 weeks and GI issues.   D from [place] came to visit for a couple of weeks.   RA flare up.  Ongoing problem. Mood is ok.     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  Helping [place] care [place] 32[place]  Past Psychiatric Medication Trials:  trazodone hangover, Ambien, belsomra, Dayvigo to expensive, melatonin, Benadryl hangover and combos (Lunesta never tried) Xanax hangover Fluoxetine 60, sertraline NR, duloxetine,  Lexapro, Wellbutrin, buspirone,  Ritalin LA, Concerta 54, Adderall 20 dizzy, Vyvanse 60  Under care at this office since July 2002   Review of Systems:  Review of Systems  Cardiovascular:  Negative for palpitations.  Musculoskeletal:  Positive for arthralgias, back pain, gait problem and joint swelling.  Skin:  Positive for rash.  Neurological:  Negative for dizziness, tremors and weakness.  Psychiatric/Behavioral:  Positive for decreased concentration and sleep disturbance. Negative for agitation, behavioral problems, confusion, dysphoric mood, hallucinations, self-injury and suicidal ideas. The patient is not nervous/anxious and is not hyperactive.     Medications: I have reviewed the patient's current medications.  Current Outpatient Medications  Medication Sig Dispense Refill   aspirin 81 MG tablet Take 81 mg by mouth daily.     calcium carbonate (OS-CAL - DOSED IN MG OF ELEMENTAL CALCIUM) 1250 (500 Ca) MG tablet      calcium-vitamin D (OSCAL WITH D) 250-125 MG-UNIT tablet Take 1 tablet by mouth daily.     celecoxib (CELEBREX) 200 MG capsule Take 1 capsule by mouth once daily 90 capsule 0   cholecalciferol (VITAMIN D) 1000 UNITS tablet Take 1,000 Units by mouth daily.     clobetasol (TEMOVATE) 0.05 % external solution as needed.      diclofenac Sodium (VOLTAREN) 1 % GEL APPLY 2 GRAMS TO AFFECTED AREA 4 TIMES A DAY 300 g 3   fluocinonide ointment (LIDEX) 0.05 % APPLY TWICE DAILY TO AFFECTED AREAS UNTIL IMPROVED THEN AS NEEDED FOR FLARES     FLUoxetine (PROZAC) 20 MG capsule Take 1 capsule (20 mg total) by mouth daily. 90 capsule 1   hydroxychloroquine (PLAQUENIL) 200 MG tablet Take 400 mg by mouth daily.     leflunomide (ARAVA) 20 MG tablet Take 20 mg by mouth daily.     [START ON 06/12/2022] lisdexamfetamine (VYVANSE) 70 MG capsule Take 1 capsule (70 mg total) by mouth daily. 30 capsule 0   [START ON 07/10/2022] lisdexamfetamine (VYVANSE) 70 MG capsule Take 1 capsule (70 mg total) by mouth daily. 30 capsule 0   MAGNESIUM-ZINC PO Take 2 tablets by mouth daily.     Melatonin-Pyridoxine (MELATIN PO) Take 5 mg by mouth at bedtime.     Multiple Vitamin (MULTIVITAMIN) tablet Take 1 tablet by mouth daily.     Omega-3 Fatty Acids (FISH OIL) 1200 MG CAPS Take 1,200 mg by mouth daily.      oxyCODONE-acetaminophen (PERCOCET/ROXICET) 5-325 MG tablet Take 1-2 tablets by mouth every 8 (eight) hours as needed for moderate pain. 72 tablet 0   Polyethyl Glycol-Propyl Glycol 0.4-0.3 % SOLN Place 1 drop into both eyes daily as needed (for dry eyes).     predniSONE (DELTASONE) 10 MG tablet Take 10 mg by mouth 2 (two) times daily.     triamcinolone cream (KENALOG) 0.1 % APPLY TO AFFECTED AREA TWICE A DAY UNTIL CLEAR THEN AS NEEDED     Turmeric 500 MG TABS Take 1 tablet by mouth daily.     colchicine 0.6 MG tablet Take 0.6 mg by mouth daily. (Patient not taking: Reported on 05/11/2022)     ipratropium (ATROVENT) 0.03 % nasal spray Place into both nostrils. (Patient not taking: Reported on 05/11/2022)     lisdexamfetamine (VYVANSE) 70 MG capsule Take 1 capsule (70 mg total) by mouth daily. 30 capsule 0   nystatin-triamcinolone ointment (MYCOLOG) Apply 1 application topically 2 (two) times daily. (Patient not taking: Reported on 05/11/2022) 30 g 1   No  current facility-administered medications for this  visit.    Medication Side Effects: None  Allergies:  Allergies  Allergen Reactions   Cosentyx [Secukinumab] Rash    Severe case of psorasis   Sertraline Hcl Other (See Comments)    Made me crazy    Avelox [Moxifloxacin Hcl In Nacl]     Pt cant remember reaction   Nitrofurantoin     Other reaction(s): Other (See Comments) tired Pt cant remember reaction Other reaction(s): Unknown   Rosuvastatin     Other reaction(s): Unknown   Latex Itching and Rash    Redness   Macrobid [Nitrofurantoin Monohyd Macro] Other (See Comments)    tired   Statins Other (See Comments)    Muscle aches    Past Medical History:  Diagnosis Date   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   ADHD (attention deficit hyperactivity disorder)    Allergy    Anxiety    Arthritis    Cataract    bil cateracts removed   Complication of anesthesia    first colonoscopy pt states she woke up   Depression    GERD (gastroesophageal reflux disease)    Globus sensation    Hyperlipidemia    Incontinence    bowels at times   Insomnia    Multinodular goiter (nontoxic)    Psoriatic arthritis (HCC)    Spinal stenosis of lumbar region 03/10/2015   MRI    Statin intolerance 01/27/2013    Family History  Problem Relation Age of Onset   Heart disease Mother    Hyperlipidemia Mother    Coronary artery disease Father 54      CABG in early 538's  Hyperlipidemia Father    Epilepsy Grandchild        Severe form   Colon cancer Neg Hx    Esophageal cancer Neg Hx    Rectal cancer Neg Hx    Stomach cancer Neg Hx    Colon polyps Neg Hx    Crohn's disease Neg Hx     Social History   Socioeconomic History   Marital status: Married    Spouse name: Not on file   Number of children: 2   Years of education: Not on file   Highest education level: Not on file  Occupational History   Occupation: disability  Tobacco Use   Smoking status: Former    Types: Cigarettes     Quit date: 08/21/1971    Years since quitting: 50.7    Passive exposure: Never   Smokeless tobacco: Never  Vaping Use   Vaping Use: Never used  Substance and Sexual Activity   Alcohol use: Yes    Alcohol/week: 7.0 standard drinks of alcohol    Types: 7 Glasses of wine per week    Comment: daily,wine   Drug use: No   Sexual activity: Not on file  Other Topics Concern   Not on file  Social History Narrative   Married, retired RTherapist, sports  2 daughters some grandchildren   1 caffeinated drinks daily   1 alcoholic beverage daily   No tobacco      Social Determinants of Health   Financial Resource Strain: Low Risk  (07/19/2021)   Overall Financial Resource Strain (CARDIA)    Difficulty of Paying Living Expenses: Not hard at all  Food Insecurity: No Food Insecurity (07/19/2021)   Hunger Vital Sign    Worried About Running Out of Food in the Last Year: Never true    RLee [place]iningin the Last  Year: Never true  Transportation Needs: No Transportation Needs (07/19/2021)   PRAPARE - Hydrologist (Medical): No    Lack of Transportation (Non-Medical): No  Physical Activity: Insufficiently Active (07/19/2021)   Exercise Vital Sign    Days of Exercise per Week: 2 days    Minutes of Exercise per Session: 50 min  Stress: No Stress Concern Present (07/19/2021)   [place]    Feeling of Stress : Not at all  Social Connections: Unknown (07/19/2021)   Social Connection and Isolation Panel [NHANES]    Frequency of Communication with Friends and Family: Not on file    Frequency of Social Gatherings with Friends and Family: Not on file    Attends Religious Services: Not on file    Active Member of Clubs or Organizations: Not on file    Attends Archivist Meetings: Not on file    Marital Status: Married  Intimate Partner Violence: Not At Risk (07/19/2021)   Humiliation, Afraid, Rape, and  Kick questionnaire    Fear of Current or Ex-Partner: No    Emotionally Abused: No    Physically Abused: No    Sexually Abused: No    Past Medical History, Surgical history, Social history, and Family history were reviewed and updated as appropriate.   Please see review of systems for further details on the patient's review from today.   Objective:   Physical Exam:  BP 127/71   Pulse 84   Physical Exam Constitutional:      General: She is not in acute distress.    Appearance: Normal appearance. She is well-developed.  Musculoskeletal:        General: No deformity.  Neurological:     Mental Status: She is alert and oriented to person, place, and time.     Motor: No tremor.     Coordination: Coordination normal.     Gait: Gait normal.  Psychiatric:        Attention and Perception: Perception normal. She is inattentive.        Mood and Affect: Mood is anxious. Mood is not depressed. Affect is not labile, blunt, angry or inappropriate.        Speech: Speech normal.        Behavior: Behavior normal.        Thought Content: Thought content normal. Thought content is not delusional. Thought content does not include homicidal or suicidal ideation. Thought content does not include suicidal plan.        Cognition and Memory: Cognition normal.        Judgment: Judgment normal.     Comments: Insight intact. No auditory or visual hallucinations. No delusions.  Talkative per usual. Mood good. Some chronic ADD     Lab Review:     Component Value Date/Time   NA 138 11/09/2021 1141   K 4.1 11/09/2021 1141   CL 102 11/09/2021 1141   CO2 32 11/09/2021 1141   GLUCOSE 127 (H) 11/09/2021 1141   BUN 24 (H) 11/09/2021 1141   CREATININE 0.72 11/09/2021 1141   CALCIUM 9.5 11/09/2021 1141   PROT 6.8 11/09/2021 1141   ALBUMIN 4.5 11/09/2021 1141   AST 17 11/09/2021 1141   ALT 17 11/09/2021 1141   ALKPHOS 47 11/09/2021 1141   BILITOT 0.4 11/09/2021 1141   GFRNONAA >60 02/18/2016 0322    GFRAA >60 02/18/2016 0322       Component Value Date/Time  WBC 5.4 01/31/2022 1333   RBC 3.77 (L) 01/31/2022 1333   HGB 12.3 01/31/2022 1333   HCT 36.7 01/31/2022 1333   PLT 233.0 01/31/2022 1333   MCV 97.4 01/31/2022 1333   MCH 31.0 02/18/2016 0322   MCHC 33.6 01/31/2022 1333   RDW 13.2 01/31/2022 1333   LYMPHSABS 0.8 01/31/2022 1333   MONOABS 0.3 01/31/2022 1333   EOSABS 0.0 01/31/2022 1333   BASOSABS 0.0 01/31/2022 1333    No results found for: "POCLITH", "LITHIUM"   No results found for: "PHENYTOIN", "PHENOBARB", "VALPROATE", "CBMZ"   .res Assessment: Plan:    Disney was seen today for follow-up, adhd and depression.  Diagnoses and all orders for this visit:  Attention deficit hyperactivity disorder (ADHD), predominantly inattentive type -     lisdexamfetamine (VYVANSE) 70 MG capsule; Take 1 capsule (70 mg total) by mouth daily. -     lisdexamfetamine (VYVANSE) 70 MG capsule; Take 1 capsule (70 mg total) by mouth daily. -     lisdexamfetamine (VYVANSE) 70 MG capsule; Take 1 capsule (70 mg total) by mouth daily.  Major depression, recurrent, full remission ([place])  Insomnia due to mental condition  psoriatic arthritis, OA, and inflammatory   Greater than 50% of 30 min face to face time with patient was spent on counseling and coordination of care. We discussed the following.    Discussed potential benefits, risks, and side effects of stimulants with patient to include increased heart rate, palpitations, insomnia, increased anxiety, increased irritability, or decreased appetite.  Instructed patient to contact office if experiencing any significant tolerability issues.  Disc risk steroids affecting mood and sleep.  Supportive therapy dealing with stubborn H with memory problems. Disc  rheum consultation and stress from dealing with it.    tolerating Vyvanse 70 mg AM and better focus than at 60 mg AM  Consider further increase if tolerated.  Consider  memantine off label because of chronic poorly controlled ADHD. Option Qelbree Option retry lower dose Namenda off label.  continue fluoxetine to 20 mg daily DT her fear of serotonin syndrome.  Thinks she had some of those sx in the past.  May have reduced benefit DT lower dose.  Able to stop Ambien.  Using melatonin and CBD gummy  Consider alternative treatments for ADD including off label.  FU 6 mos.  Lynder Parents, MD, DFAPA   Please see After Visit Summary for patient specific instructions.  Future Appointments  Date Time Provider [place]  05/16/2022  2:30 PM Crecencio Mc, MD [facility]  05/25/2022 10:30 AM Gatha Mayer, MD [facility]    No orders of the defined types were placed in this encounter.      -------------------------------

## 2022-05-16 ENCOUNTER — Ambulatory Visit (INDEPENDENT_AMBULATORY_CARE_PROVIDER_SITE_OTHER): Payer: Medicare HMO | Admitting: Internal Medicine

## 2022-05-16 ENCOUNTER — Encounter: Payer: Self-pay | Admitting: Internal Medicine

## 2022-05-16 VITALS — BP 126/62 | HR 85 | Temp 98.0°F | Ht 66.0 in | Wt 159.4 lb

## 2022-05-16 DIAGNOSIS — Z79899 Other long term (current) drug therapy: Secondary | ICD-10-CM | POA: Diagnosis not present

## 2022-05-16 DIAGNOSIS — L4052 Psoriatic arthritis mutilans: Secondary | ICD-10-CM

## 2022-05-16 DIAGNOSIS — N3941 Urge incontinence: Secondary | ICD-10-CM

## 2022-05-16 DIAGNOSIS — R32 Unspecified urinary incontinence: Secondary | ICD-10-CM | POA: Diagnosis not present

## 2022-05-16 MED ORDER — OXYCODONE-ACETAMINOPHEN 5-325 MG PO TABS: 1.0000 | ORAL_TABLET | Freq: Three times a day (TID) | ORAL | 0 refills | Status: DC | PRN

## 2022-05-16 MED ORDER — TROSPIUM CHLORIDE 20 MG PO TABS: 20.0000 mg | ORAL_TABLET | Freq: Two times a day (BID) | ORAL | 0 refills | Status: DC

## 2022-05-16 NOTE — Assessment & Plan Note (Signed)
Ruling out infection.  Followed by trial of medicatio and referral to urogynecology

## 2022-05-16 NOTE — Progress Notes (Signed)
Subjective:  Patient ID: Madeline Young, female    DOB: 1956/07/19  Age: 66 y.o. MRN: [id number]  CC: The primary encounter diagnosis was Urinary incontinence, unspecified type. 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   HPI Madeline Young presents for  Chief Complaint  Patient presents with   Follow-up    3 month follow up    1) Chronic arthritis pain managed with oxycodone.  Refill history confirmed via [place] Controlled Substance databasE, accessed by me today.Marland Kitchen  LAst refill was MAY 30 FOR #72  and she has one on file at her pharmacy dated august 3 .  Has not picked up yet.    Feels great despite a whirlwind of activity and guests.  Just returned from renting  a condo at [employer] for 3 months .starting in May.  Her daughter lives in [place] and delivered prematurely  at 69 weeks while she was visiting and returning 84 yr old grandson.  Was still able to go to [place] for grandson's graduation.   Psoiratic arthritis:  she is taking plaquenil and Arava every other day .   Currently taking prednisone for  a psoriatic  flare,  still on it. 20 mg daily  , with a wean to 10 mg daily coming up (by rheum). Left hip bothering  her more has not had an I/A injection of steroids since march (ordered by rheum).  Insomnia: has weaned off of ambien.  Using CBD gummy, melatonin .   Urinary incontinence: using Kegel exercises   progressing in spite, had an episode of incontinence (urge)  at grandon's graduation.  Having to wear pads.     Outpatient Medications Prior to Visit  Medication Sig Dispense Refill   aspirin 81 MG tablet Take 81 mg by mouth daily.     calcium carbonate (OS-CAL - DOSED IN MG OF ELEMENTAL CALCIUM) 1250 (500 Ca) MG tablet      celecoxib (CELEBREX) 200 MG capsule Take 1 capsule by mouth once daily 90 capsule 0   cholecalciferol (VITAMIN D) 1000 UNITS tablet  Take 1,000 Units by mouth daily.     clobetasol (TEMOVATE) 0.05 % external solution as needed.     diclofenac Sodium (VOLTAREN) 1 % GEL APPLY 2 GRAMS TO AFFECTED AREA 4 TIMES A DAY 300 g 3   fluocinonide ointment (LIDEX) 0.05 % APPLY TWICE DAILY TO AFFECTED AREAS UNTIL IMPROVED THEN AS NEEDED FOR FLARES     FLUoxetine (PROZAC) 20 MG capsule Take 1 capsule (20 mg total) by mouth daily. 90 capsule 1   hydroxychloroquine (PLAQUENIL) 200 MG tablet Take 400 mg by mouth daily.     ipratropium (ATROVENT) 0.03 % nasal spray Place into both nostrils.     leflunomide (ARAVA) 20 MG tablet Take 20 mg by mouth daily.     lisdexamfetamine (VYVANSE) 70 MG capsule Take 1 capsule (70 mg total) by mouth daily. 30 capsule 0   [START ON 06/12/2022] lisdexamfetamine (VYVANSE) 70 MG capsule Take 1 capsule (70 mg total) by mouth daily. 30 capsule 0   [START ON 07/10/2022] lisdexamfetamine (VYVANSE) 70 MG capsule Take 1 capsule (70 mg total) by mouth daily. 30 capsule 0   MAGNESIUM-ZINC PO Take 2 tablets by mouth daily.     Melatonin-Pyridoxine (MELATIN PO) Take 5 mg by mouth at bedtime.     Multiple Vitamin (MULTIVITAMIN) tablet Take 1 tablet by mouth daily.  nystatin-triamcinolone ointment (MYCOLOG) Apply 1 application topically 2 (two) times daily. 30 g 1   Omega-3 Fatty Acids (FISH OIL) 1200 MG CAPS Take 1,200 mg by mouth daily.      Polyethyl Glycol-Propyl Glycol 0.4-0.3 % SOLN Place 1 drop into both eyes daily as needed (for dry eyes).     predniSONE (DELTASONE) 10 MG tablet Take 10 mg by mouth 2 (two) times daily.     triamcinolone cream (KENALOG) 0.1 % APPLY TO AFFECTED AREA TWICE A DAY UNTIL CLEAR THEN AS NEEDED     Turmeric 500 MG TABS Take 1 tablet by mouth daily.     oxyCODONE-acetaminophen (PERCOCET/ROXICET) 5-325 MG tablet Take 1-2 tablets by mouth every 8 (eight) hours as needed for moderate pain. 72 tablet 0   calcium-vitamin D (OSCAL WITH D) 250-125 MG-UNIT tablet Take 1 tablet by mouth daily. (Patient  not taking: Reported on 05/16/2022)     colchicine 0.6 MG tablet Take 0.6 mg by mouth daily. (Patient not taking: Reported on 05/11/2022)     No facility-administered medications prior to visit.    Review of Systems;  Patient denies headache, fevers, malaise, unintentional weight loss, skin rash, eye pain, sinus congestion and sinus pain, sore throat, dysphagia,  hemoptysis , cough, dyspnea, wheezing, chest pain, palpitations, orthopnea, edema, abdominal pain, nausea, melena, diarrhea, constipation, flank pain, dysuria, hematuria, urinary  Frequency, nocturia, numbness, tingling, seizures,  Focal weakness, Loss of consciousness,  Tremor, insomnia, depression, anxiety, and suicidal ideation.      Objective:  BP 126/62 (BP Location: Left Arm, Patient Position: Sitting, Cuff Size: Normal)   Pulse 85   Temp 98 F (36.7 C) (Oral)   Ht '5\' 6"'$  (1.676 m)   Wt 159 lb 6.4 oz (72.3 kg)   SpO2 98%   BMI 25.73 kg/m   BP Readings from Last 3 Encounters:  05/16/22 126/62  01/31/22 128/70  11/09/21 118/70    Wt Readings from Last 3 Encounters:  05/16/22 159 lb 6.4 oz (72.3 kg)  05/11/22 163 lb 6.4 oz (74.1 kg)  01/31/22 163 lb 3.2 oz (74 kg)    General appearance: alert, cooperative and appears stated age Ears: normal TM's and external ear canals both ears Throat: lips, mucosa, and tongue normal; teeth and gums normal Neck: no adenopathy, no carotid bruit, supple, symmetrical, trachea midline and thyroid not enlarged, symmetric, no tenderness/mass/nodules Back: symmetric, no curvature. ROM normal. No CVA tenderness. Lungs: clear to auscultation bilaterally Heart: regular rate and rhythm, S1, S2 normal, no murmur, click, rub or gallop Abdomen: soft, non-tender; bowel sounds normal; no masses,  no organomegaly Pulses: 2+ and symmetric Skin: Skin color, texture, turgor normal. No rashes or lesions Lymph nodes: Cervical, supraclavicular, and axillary nodes normal.  Lab Results  Component Value  Date   HGBA1C 6.1 01/31/2022    Lab Results  Component Value Date   CREATININE 0.72 11/09/2021   CREATININE 0.72 02/09/2021   CREATININE 0.68 08/09/2020    Lab Results  Component Value Date   WBC 5.4 01/31/2022   HGB 12.3 01/31/2022   HCT 36.7 01/31/2022   PLT 233.0 01/31/2022   GLUCOSE 127 (H) 11/09/2021   CHOL 333 (H) 01/31/2022   TRIG 89.0 01/31/2022   HDL 80.60 01/31/2022   LDLDIRECT 223.0 01/31/2022   LDLCALC 234 (H) 01/31/2022   ALT 17 11/09/2021   AST 17 11/09/2021   NA 138 11/09/2021   K 4.1 11/09/2021   CL 102 11/09/2021   CREATININE 0.72 11/09/2021   BUN  24 (H) 11/09/2021   CO2 32 11/09/2021   TSH 0.96 01/31/2022   HGBA1C 6.1 01/31/2022   MICROALBUR 1.7 01/31/2022    MM DIAG BREAST TOMO UNI LEFT  Result Date: 11/14/2021 CLINICAL DATA:  66 year old female presenting as a recall from screening for possible left breast asymmetry. EXAM: DIGITAL DIAGNOSTIC UNILATERAL LEFT MAMMOGRAM WITH TOMOSYNTHESIS AND CAD TECHNIQUE: Left digital diagnostic mammography and breast tomosynthesis was performed. The images were evaluated with computer-aided detection. COMPARISON:  Previous exam(s). ACR Breast Density Category b: There are scattered areas of fibroglandular density. FINDINGS: Full field cc and mL as well as spot compression tomosynthesis CC views of the left breast were performed for a questioned asymmetry seen only cc view in the outer anterior left breast. On the additional imaging the asymmetry resolves and most likely represented normal overlapping fibroglandular tissue. There is no mass or distortion. There are no new findings elsewhere in the left breast. IMPRESSION: No mammographic evidence of malignancy in the left breast. RECOMMENDATION: Screening mammogram in one year.(Code:SM-B-01Y) I have discussed the findings and recommendations with the patient. If applicable, a reminder letter will be sent to the patient regarding the next appointment. BI-RADS CATEGORY  1:  Negative. Electronically Signed   By: Audie Pinto M.D.   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 Her pain is managed with oxycodone . Marland KitchenMarland KitchenRefill history confirmed via [place] Controlled Substance databas, accessed by me today..      Relevant Medications   oxyCODONE-acetaminophen (PERCOCET/ROXICET) 5-325 MG tablet   Urge incontinence    Ruling out infection.  Followed by trial of medicatio and referral to urogynecology      Relevant Medications   trospium (SANCTURA) 20 MG tablet   Other Relevant Orders   Ambulatory referral to Urogynecology   Other Visit Diagnoses     Urinary incontinence, unspecified type    -  Primary   Relevant Medications   trospium (SANCTURA) 20 MG tablet   Long-term use of high-risk medication       Relevant Orders   DG Bone Density       I spent a total of  32 minutes with this patient in a face to face visit on the date of this encounter reviewing the last office visit with me in April,  her most recent rheumatology follow up,  most recent imaging study ,   and post visit ordering of testing and therapeutics.    Follow-up: Return in about 3 months (around 08/16/2022).   Crecencio Mc, MD

## 2022-05-16 NOTE — Patient Instructions (Addendum)
Congratulations on getting off of Ambien!  If you develop any more sleep issues,   You might want to try using Relaxium for insomnia  (as seen advocated by [employer] on [employer]) . It contains:  Melatonin 5 mg  Chamomile 25 mg Passionflower extract 75 mg GABA 100 mg Ashwaganda extract 125 mg Magnesium citrate, glycinate, oxide (100 mg)  L tryptophan 500 mg Valerest (proprietary  ingredient ; probably valeria root extract)     Your  DEXA scan  been ordered.  You are encouraged (required) to call to schedule your own  appointment at [facility]  , and their phone number is [phone]    Trial of [place] and referral to [facility] for urinary incontinence

## 2022-05-16 NOTE — Assessment & Plan Note (Addendum)
Manage with plaquenil and AravA,  With prednisone tapers for flares. Her pain is managed with oxycodone . Marland KitchenMarland KitchenRefill history confirmed via [place] Controlled Substance databas, accessed by me today.Marland Kitchen

## 2022-05-18 ENCOUNTER — Encounter: Payer: Self-pay | Admitting: Internal Medicine

## 2022-05-25 ENCOUNTER — Other Ambulatory Visit: Payer: Self-pay

## 2022-05-25 ENCOUNTER — Telehealth: Payer: Self-pay

## 2022-05-25 ENCOUNTER — Ambulatory Visit (AMBULATORY_SURGERY_CENTER): Payer: Medicare HMO | Admitting: Internal Medicine

## 2022-05-25 ENCOUNTER — Encounter: Payer: Self-pay | Admitting: Internal Medicine

## 2022-05-25 VITALS — BP 140/74 | HR 78 | Temp 97.7°F | Resp 15 | Ht 66.0 in | Wt 163.0 lb

## 2022-05-25 DIAGNOSIS — Z09 Encounter for follow-up examination after completed treatment for conditions other than malignant neoplasm: Secondary | ICD-10-CM | POA: Diagnosis not present

## 2022-05-25 DIAGNOSIS — D122 Benign neoplasm of ascending colon: Secondary | ICD-10-CM

## 2022-05-25 DIAGNOSIS — D128 Benign neoplasm of rectum: Secondary | ICD-10-CM

## 2022-05-25 DIAGNOSIS — E785 Hyperlipidemia, unspecified: Secondary | ICD-10-CM | POA: Diagnosis not present

## 2022-05-25 DIAGNOSIS — Z860100 Personal history of colon polyps, unspecified: Secondary | ICD-10-CM

## 2022-05-25 DIAGNOSIS — R159 Full incontinence of feces: Secondary | ICD-10-CM

## 2022-05-25 DIAGNOSIS — R69 Illness, unspecified: Secondary | ICD-10-CM | POA: Diagnosis not present

## 2022-05-25 MED ORDER — SODIUM CHLORIDE 0.9 % IV SOLN: 500.0000 mL | Freq: Once | INTRAVENOUS | Status: DC

## 2022-05-25 NOTE — Progress Notes (Signed)
[facility] History and Physical   Primary Care Physician:  Crecencio Mc, MD   Reason for Procedure:   Hx colon polyps  Plan:    colonoscopy     HPI: Madeline Young is a 66 y.o. female w/ hx adenomatous colon polyps 2009 and 2016 (15m adenoma)   Past Medical History:  Diagnosis Date   Adenoma 10/06/2008   sigmoid 651m  ADHD (attention deficit hyperactivity disorder)    Allergy    Anxiety    Arthritis    Cataract    bil cateracts removed   Complication of anesthesia    first colonoscopy pt states she woke up   Depression    GERD (gastroesophageal reflux disease)    Globus sensation    Hyperlipidemia    Incontinence    bowels at times   Insomnia    Multinodular goiter (nontoxic)    Psoriatic arthritis ([place]   Spinal stenosis of lumbar region 03/10/2015   MRI    Statin intolerance 01/27/2013    Past Surgical History:  Procedure Laterality Date   BACK SURGERY  02/17/2016   Dr. JeJacqulynn CadetL4-L5 fusion    BIOPSY THYROID  2014   COLONOSCOPY     COLONOSCOPY W/ POLYPECTOMY     EYE SURGERY     bilateral cataract surgery w/ lens implant   FOOT SURGERY  2006   [facility]  [place]  [place]   With empiric esophageal dilation    Prior to Admission medications   Medication Sig Start Date End Date Taking? Authorizing Provider  aspirin 81 MG tablet Take 81 mg by mouth daily.   Yes [provider]  calcium carbonate (OS-CAL - DOSED IN MG OF ELEMENTAL CALCIUM) 1250 (500 Ca) MG tablet    Yes [provider]  cholecalciferol (VITAMIN D) 1000 UNITS tablet Take 1,000 Units by mouth daily.   Yes [provider]  FLUoxetine (PROZAC) 20 MG capsule Take 1 capsule (20 mg total) by mouth daily. 11/10/21  Yes Cottle, CaBilley Co MD  hydroxychloroquine (PLAQUENIL) 200 MG tablet Take 400 mg by mouth daily. 09/20/21  Yes [provider]  leflunomide (ARAVA) 20 MG tablet Take 20 mg by mouth daily. 01/03/22  Yes [provider]  lisdexamfetamine (VYVANSE) 70 MG capsule Take 1 capsule (70 mg total) by mouth daily. 05/15/22  Yes Cottle, CaBilley Co MD  MAGNESIUM-ZINC PO Take 2 tablets by mouth daily.   Yes [provider]  Melatonin-Pyridoxine (MELATIN PO) Take 5 mg by mouth at bedtime.   Yes [provider]  Multiple Vitamin (MULTIVITAMIN) tablet Take 1 tablet by mouth daily.   Yes [provider]  Omega-3 Fatty Acids (FISH OIL) 1200 MG CAPS Take 1,200 mg by mouth daily.    Yes [provider]  predniSONE (DELTASONE) 10 MG tablet Take 10 mg by mouth 2 (two) times daily. 02/21/22  Yes [provider]  celecoxib (CELEBREX) 200 MG capsule Take 1 capsule by mouth once daily 04/05/22   TuCrecencio McMD  clobetasol (TEMOVATE) 0.05 % external solution as needed. 11/12/18   [provider]  diclofenac Sodium (VOLTAREN) 1 % GEL APPLY 2 GRAMS TO AFFECTED AREA 4 TIMES A DAY 01/22/20   TuCrecencio McMD  fluocinonide ointment (LIDEX) 0.05 % APPLY TWICE DAILY TO AFFECTED AREAS UNTIL IMPROVED THEN AS NEEDED  FOR FLARES 12/24/18   [provider]  ipratropium (ATROVENT) 0.03 % nasal spray Place into both nostrils. 09/24/20   [provider]  lisdexamfetamine (VYVANSE) 70 MG capsule Take 1 capsule (70 mg total) by mouth daily. 06/12/22   Cottle, Billey Co., MD  lisdexamfetamine (VYVANSE) 70 MG capsule Take 1 capsule (70 mg total) by mouth daily. 07/10/22   Cottle, Billey Co., MD  nystatin-triamcinolone ointment [facility]) Apply 1 application topically 2 (two) times daily. 09/08/20   Gatha Mayer, MD  oxyCODONE-acetaminophen (PERCOCET/ROXICET) 5-325 MG tablet Take 1-2 tablets by mouth every 8 (eight) hours as needed for moderate pain. Do not refill less than 30 days from prior refill 05/16/22   Crecencio Mc, MD  Polyethyl Glycol-Propyl Glycol 0.4-0.3 % SOLN Place 1 drop  into both eyes daily as needed (for dry eyes).    [provider]  triamcinolone cream (KENALOG) 0.1 % APPLY TO AFFECTED AREA TWICE A DAY UNTIL CLEAR THEN AS NEEDED 11/25/18   [provider]  trospium (SANCTURA) 20 MG tablet Take 1 tablet (20 mg total) by mouth 2 (two) times daily. 05/16/22   Crecencio Mc, MD  Turmeric 500 MG TABS Take 1 tablet by mouth daily.    [provider]    Current Outpatient Medications  Medication Sig Dispense Refill   aspirin 81 MG tablet Take 81 mg by mouth daily.     calcium carbonate (OS-CAL - DOSED IN MG OF ELEMENTAL CALCIUM) 1250 (500 Ca) MG tablet      cholecalciferol (VITAMIN D) 1000 UNITS tablet Take 1,000 Units by mouth daily.     FLUoxetine (PROZAC) 20 MG capsule Take 1 capsule (20 mg total) by mouth daily. 90 capsule 1   hydroxychloroquine (PLAQUENIL) 200 MG tablet Take 400 mg by mouth daily.     leflunomide (ARAVA) 20 MG tablet Take 20 mg by mouth daily.     lisdexamfetamine (VYVANSE) 70 MG capsule Take 1 capsule (70 mg total) by mouth daily. 30 capsule 0   MAGNESIUM-ZINC PO Take 2 tablets by mouth daily.     Melatonin-Pyridoxine (MELATIN PO) Take 5 mg by mouth at bedtime.     Multiple Vitamin (MULTIVITAMIN) tablet Take 1 tablet by mouth daily.     Omega-3 Fatty Acids (FISH OIL) 1200 MG CAPS Take 1,200 mg by mouth daily.      predniSONE (DELTASONE) 10 MG tablet Take 10 mg by mouth 2 (two) times daily.     celecoxib (CELEBREX) 200 MG capsule Take 1 capsule by mouth once daily 90 capsule 0   clobetasol (TEMOVATE) 0.05 % external solution as needed.     diclofenac Sodium (VOLTAREN) 1 % GEL APPLY 2 GRAMS TO AFFECTED AREA 4 TIMES A DAY 300 g 3   fluocinonide ointment (LIDEX) 0.05 % APPLY TWICE DAILY TO AFFECTED AREAS UNTIL IMPROVED THEN AS NEEDED FOR FLARES     ipratropium (ATROVENT) 0.03 % nasal spray Place into both nostrils.     [START ON 06/12/2022] lisdexamfetamine (VYVANSE) 70 MG capsule Take 1 capsule (70 mg total) by mouth  daily. 30 capsule 0   [START ON 07/10/2022] lisdexamfetamine (VYVANSE) 70 MG capsule Take 1 capsule (70 mg total) by mouth daily. 30 capsule 0   nystatin-triamcinolone ointment (MYCOLOG) Apply 1 application topically 2 (two) times daily. 30 g 1   oxyCODONE-acetaminophen (PERCOCET/ROXICET) 5-325 MG tablet Take 1-2 tablets by mouth every 8 (eight) hours as needed for moderate pain. Do not refill less than 30 days  from prior refill 72 tablet 0   Polyethyl Glycol-Propyl Glycol 0.4-0.3 % SOLN Place 1 drop into both eyes daily as needed (for dry eyes).     triamcinolone cream (KENALOG) 0.1 % APPLY TO AFFECTED AREA TWICE A DAY UNTIL CLEAR THEN AS NEEDED     trospium (SANCTURA) 20 MG tablet Take 1 tablet (20 mg total) by mouth 2 (two) times daily. 60 tablet 0   Turmeric 500 MG TABS Take 1 tablet by mouth daily.     Current Facility-Administered Medications  Medication Dose Route Frequency Provider Last Rate Last Admin   0.9 %  sodium chloride infusion  500 mL Intravenous Once Gatha Mayer, MD        Allergies as of 05/25/2022 - Review Complete 05/25/2022  Allergen Reaction Noted   Cosentyx [secukinumab] Rash 01/20/2019   Sertraline hcl Other (See Comments) 09/15/2008   Avelox [moxifloxacin hcl in nacl]  08/21/2011   Nitrofurantoin  09/15/2008   Rosuvastatin  05/25/2021   Latex Itching and Rash 02/08/2016   Macrobid [nitrofurantoin monohyd macro] Other (See Comments) 07/18/2017   Statins Other (See Comments) 08/21/2011    Family History  Problem Relation Age of Onset   Heart disease Mother    Hyperlipidemia Mother    Coronary artery disease Father 52       CABG in early 43's   Hyperlipidemia Father    Epilepsy Grandchild        Severe form   Colon cancer Neg Hx    Esophageal cancer Neg Hx    Rectal cancer Neg Hx    Stomach cancer Neg Hx    Colon polyps Neg Hx    Crohn's disease Neg Hx     Social History   Socioeconomic History   Marital status: Married    Spouse name: Not on  file   Number of children: 2   Years of education: Not on file   Highest education level: Not on file  Occupational History   Occupation: disability  Tobacco Use   Smoking status: Former    Types: Cigarettes    Quit date: 08/21/1971    Years since quitting: 50.7    Passive exposure: Never   Smokeless tobacco: Never  Vaping Use   Vaping Use: Never used  Substance and Sexual Activity   Alcohol use: Yes    Alcohol/week: 7.0 standard drinks of alcohol    Types: 7 Glasses of wine per week    Comment: daily,wine   Drug use: No   Sexual activity: Not on file  Other Topics Concern   Not on file  Social History Narrative   Married, retired Therapist, sports   2 daughters some grandchildren   1 caffeinated drinks daily   1 alcoholic beverage daily   No tobacco      Social Determinants of Health   Financial Resource Strain: Low Risk  (07/19/2021)   Overall Financial Resource Strain (CARDIA)    Difficulty of Paying Living Expenses: Not hard at all  Food Insecurity: No Food Insecurity (07/19/2021)   Hunger Vital Sign    Worried About Running Out of Food in the Last Year: Never true    Ran Out of Food in the Last Year: Never true  Transportation Needs: No Transportation Needs (07/19/2021)   PRAPARE - Hydrologist (Medical): No    Lack of Transportation (Non-Medical): No  Physical Activity: Insufficiently Active (07/19/2021)   Exercise Vital Sign    Days of Exercise per Week:  2 days    Minutes of Exercise per Session: 50 min  Stress: No Stress Concern Present (07/19/2021)   [place]    Feeling of Stress : Not at all  Social Connections: Unknown (07/19/2021)   Social Connection and Isolation Panel [NHANES]    Frequency of Communication with Friends and Family: Not on file    Frequency of Social Gatherings with Friends and Family: Not on file    Attends Religious Services: Not on file    Active  Member of Clubs or Organizations: Not on file    Attends Archivist Meetings: Not on file    Marital Status: Married  Intimate Partner Violence: Not At Risk (07/19/2021)   Humiliation, Afraid, Rape, and Kick questionnaire    Fear of Current or Ex-Partner: No    Emotionally Abused: No    Physically Abused: No    Sexually Abused: No    Review of Systems:  All other review of systems negative except as mentioned in the HPI.  Physical Exam: Vital signs BP 134/71   Pulse 83   Temp 97.7 F (36.5 C)   Ht '5\' 6"'$  (1.676 m)   Wt 163 lb (73.9 kg)   SpO2 100%   BMI 26.31 kg/m   General:   Alert,  Well-developed, well-nourished, pleasant and cooperative in NAD Lungs:  Clear throughout to auscultation.   Heart:  Regular rate and rhythm; no murmurs, clicks, rubs,  or gallops. Abdomen:  Soft, nontender and nondistended. Normal bowel sounds.   Neuro/Psych:  Alert and cooperative. Normal mood and affect. A and O x 3   Anoderm inspection revealed no abnormality  Digital exam revealed normal resting tone and ? Slightly reducedvoluntary squeeze. No mass or rectocele present. But anterior rectal wall seeemed sl weak Simulated defecation with valsalva revealed appropriate abdominal contraction and descent.    '@Marji Kuehnel'$  Simonne Maffucci, MD, [facility] [phone] (pager) 05/25/2022 10:56 AM@

## 2022-05-25 NOTE — Patient Instructions (Addendum)
I found and removed 2 tiny polyps today.  You also have a condition called diverticulosis - common and not usually a problem. Please read the handout provided.  I will let you know pathology results and when to have another routine colonoscopy by mail and/or My Chart.  Will refer for pelvic floor PT to help urinary and fecal incontinence issues.  I appreciate the opportunity to care for you. Gatha Mayer, MD, FACG  YOU HAD AN ENDOSCOPIC PROCEDURE TODAY AT [facility]:   Refer to the procedure report that was given to you for any specific questions about what was found during the examination.  If the procedure report does not answer your questions, please call your gastroenterologist to clarify.  If you requested that your care partner not be given the details of your procedure findings, then the procedure report has been included in a sealed envelope for you to review at your convenience later.  YOU SHOULD EXPECT: Some feelings of bloating in the abdomen. Passage of more gas than usual.  Walking can help get rid of the air that was put into your GI tract during the procedure and reduce the bloating. If you had a lower endoscopy (such as a colonoscopy or flexible sigmoidoscopy) you may notice spotting of blood in your stool or on the toilet paper. If you underwent a bowel prep for your procedure, you may not have a normal bowel movement for a few days.  Please Note:  You might notice some irritation and congestion in your nose or some drainage.  This is from the oxygen used during your procedure.  There is no need for concern and it should clear up in a day or so.  SYMPTOMS TO REPORT IMMEDIATELY:  Following lower endoscopy (colonoscopy or flexible sigmoidoscopy):  Excessive amounts of blood in the stool  Significant tenderness or worsening of abdominal pains  Swelling of the abdomen that is new, acute  Fever of 100F or higher  For urgent or emergent issues, a  gastroenterologist can be reached at any hour by calling [phone]. Do not use MyChart messaging for urgent concerns.    DIET:  We do recommend a small meal at first, but then you may proceed to your regular diet.  Drink plenty of fluids but you should avoid alcoholic beverages for 24 hours.  ACTIVITY:  You should plan to take it easy for the rest of today and you should NOT DRIVE or use heavy machinery until tomorrow (because of the sedation medicines used during the test).    FOLLOW UP: Our staff will call the number listed on your records the next business day following your procedure.  We will call around 7:15- 8:00 am to check on you and address any questions or concerns that you may have regarding the information given to you following your procedure. If we do not reach you, we will leave a message.  If you develop any symptoms (ie: fever, flu-like symptoms, shortness of breath, cough etc.) before then, please call [phone].  If you test positive for Covid 19 in the 2 weeks post procedure, please call and report this information to [place].    If any biopsies were taken you will be contacted by phone or by letter within the next 1-3 weeks.  Please call us at [phone] if you have not heard about the biopsies in 3 weeks.    SIGNATURES/CONFIDENTIALITY: You and/or your care partner have signed paperwork which will be entered into  your electronic medical record.  These signatures attest to the fact that that the information above on your After Visit Summary has been reviewed and is understood.  Full responsibility of the confidentiality of this discharge information lies with you and/or your care-partner.

## 2022-05-25 NOTE — Progress Notes (Signed)
Pt's states no medical or surgical changes since previsit or office visit. 

## 2022-05-25 NOTE — Op Note (Signed)
[facility] Patient Name: Madeline Young Procedure Date: 05/25/2022 10:55 AM MRN: [id number] Endoscopist: Gatha Mayer , MD Age: 66 Referring MD:  Date of Birth: Jun 17, 1956 Gender: Female Account #: [id number] Procedure:                Colonoscopy Indications:              Surveillance: Personal history of adenomatous                            polyps on last colonoscopy > 5 years ago, Last                            colonoscopy: 2016 Medicines:                Monitored Anesthesia Care Procedure:                Pre-Anesthesia Assessment:                           - Prior to the procedure, a History and Physical                            was performed, and patient medications and                            allergies were reviewed. The patient's tolerance of                            previous anesthesia was also reviewed. The risks                            and benefits of the procedure and the sedation                            options and risks were discussed with the patient.                            All questions were answered, and informed consent                            was obtained. Prior Anticoagulants: The patient has                            taken no previous anticoagulant or antiplatelet                            agents. ASA Grade Assessment: II - A patient with                            mild systemic disease. After reviewing the risks                            and benefits, the patient was deemed in  satisfactory condition to undergo the procedure.                           After obtaining informed consent, the colonoscope                            was passed under direct vision. Throughout the                            procedure, the patient's blood pressure, pulse, and                            oxygen saturations were monitored continuously. The                            CF [id number] #[id number] was introduced through the  anus                            and advanced to the the cecum, identified by                            appendiceal orifice and ileocecal valve. The                            colonoscopy was performed without difficulty. The                            patient tolerated the procedure well. The quality                            of the bowel preparation was adequate. The                            ileocecal valve, appendiceal orifice, and rectum                            were photographed. The bowel preparation used was                            Miralax via split dose instruction. Scope In: 11:11:58 AM Scope Out: 11:34:28 AM Scope Withdrawal Time: 0 hours 16 minutes 29 seconds  Total Procedure Duration: 0 hours 22 minutes 30 seconds  Findings:                 The perianal and digital rectal examinations were                            normal.                           Two sessile and semi-pedunculated polyps were found                            in the proximal rectum and ascending colon. The  polyps were diminutive in size. These polyps were                            removed with a cold snare. Resection and retrieval                            were complete. Verification of patient                            identification for the specimen was done. Estimated                            blood loss was minimal.                           Multiple diverticula were found in the sigmoid                            colon.                           The exam was otherwise without abnormality on                            direct and retroflexion views. Complications:            No immediate complications. Estimated Blood Loss:     Estimated blood loss was minimal. Impression:               - Two diminutive polyps in the proximal rectum and                            in the ascending colon, removed with a cold snare.                            Resected and retrieved.                            - Diverticulosis in the sigmoid colon.                           - The examination was otherwise normal on direct                            and retroflexion views.                           - Personal history of colonic polyps. 2009 and 2016                            (3 mm adenoma) Recommendation:           - Patient has a contact number available for                            emergencies. The signs and symptoms of potential  delayed complications were discussed with the                            patient. Return to normal activities tomorrow.                            Written discharge instructions were provided to the                            patient.                           - Resume previous diet.                           - Continue present medications.                           - Repeat colonoscopy is recommended for                            surveillance. The colonoscopy date will be                            determined after pathology results from today's                            exam become available for review.                           - We will refer for pelvic floor PT (urinary and                            fecal incontinence) - overall recatal exam seems ok                            ? sl decrease voluntrary anal tone Gatha Mayer, MD 05/25/2022 11:41:55 AM This report has been signed electronically.

## 2022-05-25 NOTE — Telephone Encounter (Signed)
Referral Placed in Epic:   PT [facility] Reasons - fecal and urinary incontinence

## 2022-05-25 NOTE — Progress Notes (Signed)
To pacu, VSS. Report to RN.tb 

## 2022-05-25 NOTE — Progress Notes (Signed)
Called to room to assist during endoscopic procedure.  Patient ID and intended procedure confirmed with present staff. Received instructions for my participation in the procedure from the performing physician.  

## 2022-05-25 NOTE — Telephone Encounter (Signed)
-----   Message from Gatha Mayer, MD sent at 05/25/2022 11:36 AM EDT ----- Regarding: Pelvic PT referral Please refer to PT [facility]  Reasons - fecal and urinary incontinence

## 2022-05-26 ENCOUNTER — Telehealth: Payer: Self-pay | Admitting: *Deleted

## 2022-05-26 NOTE — Telephone Encounter (Signed)
  Follow up Call-     05/25/2022   10:10 AM  Call back number  Post procedure Call Back phone  # [phone]  Permission to leave phone message Yes     Patient questions:  Do you have a fever, pain , or abdominal swelling? No. Pain Score  0 *  Have you tolerated food without any problems? Yes.    Have you been able to return to your normal activities? Yes.    Do you have any questions about your discharge instructions: Diet   No. Medications  No. Follow up visit  No.  Do you have questions or concerns about your Care? No.  Actions: * If pain score is 4 or above: No action needed, pain <4.

## 2022-05-29 ENCOUNTER — Other Ambulatory Visit: Payer: Self-pay

## 2022-05-29 ENCOUNTER — Telehealth: Payer: Self-pay | Admitting: Psychiatry

## 2022-05-29 MED ORDER — LISDEXAMFETAMINE DIMESYLATE 60 MG PO CAPS: 60.0000 mg | ORAL_CAPSULE | ORAL | 0 refills | Status: DC

## 2022-05-29 NOTE — Telephone Encounter (Signed)
Pended.

## 2022-05-29 NOTE — Telephone Encounter (Signed)
Pt LVM @ 11:34a on 8/19.  She would like rfill of Vyvanse '60mg'$  sent to [facility] [place].  She takes '70mg'$ , but they don't have that one in stock.   Next appt 2/8

## 2022-06-01 ENCOUNTER — Encounter: Payer: Self-pay | Admitting: Internal Medicine

## 2022-06-07 ENCOUNTER — Other Ambulatory Visit: Payer: Self-pay | Admitting: Internal Medicine

## 2022-06-20 ENCOUNTER — Encounter: Payer: Self-pay | Admitting: Internal Medicine

## 2022-06-26 ENCOUNTER — Telehealth: Payer: Self-pay | Admitting: Psychiatry

## 2022-06-26 NOTE — Telephone Encounter (Signed)
[facility] called [facility] [address] Has VYVANSE 60 mg in stock. Please send Rx ASAP.

## 2022-06-27 ENCOUNTER — Other Ambulatory Visit: Payer: Self-pay

## 2022-06-27 MED ORDER — LISDEXAMFETAMINE DIMESYLATE 60 MG PO CAPS: 60.0000 mg | ORAL_CAPSULE | ORAL | 0 refills | Status: DC

## 2022-06-27 NOTE — Telephone Encounter (Signed)
Pended.

## 2022-06-29 ENCOUNTER — Encounter: Payer: Self-pay | Admitting: Physical Therapy

## 2022-06-29 ENCOUNTER — Ambulatory Visit: Payer: Medicare HMO | Attending: Internal Medicine | Admitting: Physical Therapy

## 2022-06-29 DIAGNOSIS — R2689 Other abnormalities of gait and mobility: Secondary | ICD-10-CM | POA: Insufficient documentation

## 2022-06-29 DIAGNOSIS — R159 Full incontinence of feces: Secondary | ICD-10-CM | POA: Insufficient documentation

## 2022-06-29 DIAGNOSIS — M5459 Other low back pain: Secondary | ICD-10-CM | POA: Diagnosis not present

## 2022-06-29 DIAGNOSIS — R278 Other lack of coordination: Secondary | ICD-10-CM | POA: Insufficient documentation

## 2022-06-29 DIAGNOSIS — R32 Unspecified urinary incontinence: Secondary | ICD-10-CM | POA: Insufficient documentation

## 2022-06-29 DIAGNOSIS — M533 Sacrococcygeal disorders, not elsewhere classified: Secondary | ICD-10-CM | POA: Diagnosis not present

## 2022-06-29 NOTE — Therapy (Signed)
OUTPATIENT PHYSICAL THERAPY EVALUATION   Patient Name: Madeline Young MRN: [id number] DOB:11-29-1955, 66 y.o., female Today's Date: 06/29/2022   PT End of Session - 06/29/22 1343     Visit Number 1    Number of Visits 10    Date for PT Re-Evaluation 09/07/22    PT Start Time 1330    PT Stop Time 0962    PT Time Calculation (min) 45 min    Activity Tolerance Patient tolerated treatment well    Behavior During Therapy [facility] for tasks assessed/performed             Past Medical History:  Diagnosis Date   Adenoma 10/06/2008   sigmoid 39m   ADHD (attention deficit hyperactivity disorder)    Allergy    Anxiety    Arthritis    Cataract    bil cateracts removed   Complication of anesthesia    first colonoscopy pt states she woke up   Depression    GERD (gastroesophageal reflux disease)    Globus sensation    Hyperlipidemia    Incontinence    bowels at times   Insomnia    Multinodular goiter (nontoxic)    [place]    Spinal stenosis of lumbar region 03/10/2015   MRI    Statin intolerance 01/27/2013   Past Surgical History:  Procedure Laterality Date   BACK SURGERY  02/17/2016   Dr. JJacqulynn Cadet L4-L5 fusion    BIOPSY THYROID  2014   COLONOSCOPY     COLONOSCOPY W/ POLYPECTOMY     EYE SURGERY     bilateral cataract surgery w/ lens implant   FOOT SURGERY  2006   Right foot , secondary to severe loss of joint ([facility]   POLYPECTOMY     TONSILLECTOMY     UPPER GASTROINTESTINAL ENDOSCOPY  2005   With empiric esophageal dilation   Patient Active Problem List   Diagnosis Date Noted   Urge incontinence 05/16/2022   Onychomycosis 08/17/2021   Drug allergy 06/29/2021   Fibromyalgia 06/29/2021   Generalized osteoarthritis 06/29/2021   Hand joint pain 06/29/2021   Other intervertebral disc degeneration, lumbar region 06/29/2021   Other long term (current) drug therapy 06/29/2021   Fecal smearing 08/10/2020   Sciatica 01/28/2020   Rhinitis,  non-allergic 11/10/2019   Insomnia 08/10/2019   Anxiety 10/11/2018   Carotid artery stenosis, asymptomatic, bilateral 10/29/2016   ADD (attention deficit disorder) 10/29/2016   Spondylolisthesis of lumbar region 02/17/2016   Globus sensation since at least 2005 04/29/2015   GERD (gastroesophageal reflux disease) 11/17/2014   Statin intolerance 01/27/2013   Travel advice encounter 01/27/2013   Multinodular goiter (nontoxic) 08/11/2012   Overweight (BMI 25.0-29.9) 08/11/2012   Psoriatic arthritis, destructive type ([place]    Screening for cervical cancer 08/21/2011   Screening for breast cancer 08/21/2011   Hx of adenomatous polyp of colon 10/06/2008   Familial hyperlipidemia 09/15/2008   Major depressive disorder, single episode, in remission ([place] 09/15/2008   ALLERGIC RHINITIS 09/15/2008    PCP: TDerrel Nip  REFERRING PROVIDER: GLucretia RoersDIAG: R32,R15.9 (ICD-10-CM) - Urinary and fecal incontinence  Rationale for Evaluation and Treatment Rehabilitation  THERAPY DIAG:  Other lack of coordination  Sacrococcygeal disorders, not elsewhere classified  Other abnormalities of gait and mobility  Other low back pain  ONSET DATE:   SUBJECTIVE:  SUBJECTIVE STATEMENT: 1) CLBP: Lumbar fusion 2017, currently 4/10, at worst 10/10 when bending over a low stool to weed garden 20-30 min, carrying things too heavy, standing 3-4 hours and walking 1 hour, drive 3 hours or more . Pt reports stiffness has gone away in the morning with her medications. Stress and diet and weather affect her pain. Pt meditates and rest. Pt was doing yoga 2 x week but she stopped going during Pandemic.   Pt did water aerobic on her own and she enjoyed that.  Hx of R foot surgery    2) urinary and fecal incontinence:  fecal  incontinence started after lumbar surgery 2017.  Currently pt wears pads during the day for /incontinence 2 x day. Fecal  leakage occurs 2 x week and urinary incontinence occurs daily with urge incontinence.  PERTINENT HISTORY:   Psoriatic arthritis, inflammatory, osteoarthris, prior to LBP surgery  (fusion at L1-2 in 2017) , R foot surgery   PAIN:  Are you having pain? Yes: see above   PRECAUTIONS:   NONE   WEIGHT BEARING RESTRICTIONS : NONE   FALLS:  Has patient fallen in last 6 months? 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   PLOF: Independent  PATIENT GOALS                Have more control     OBJECTIVE:    [facility] PT Assessment - 06/30/22 1316       Coordination   Coordination and Movement Description chest breathing, uppr trap overuse      Strength   Overall Strength Comments R LE 3+/5, L 5/5      Palpation   SI assessment  L iliac crest and L shoulder higher      Ambulation/Gait   Gait Comments 1.19 m/s( L hip adducted,/ IR) L hip higher,              [facility] Adult PT Treatment/Exercise - 06/30/22 1316       Therapeutic Activites    Therapeutic Activities Other Therapeutic Activities    Other Therapeutic Activities see pt education      Neuro Re-ed    Neuro Re-ed Details  cued for sitting posture              HOME EXERCISE PROGRAM: See pt instruction section    ASSESSMENT:  CLINICAL IMPRESSION:               Pt is a   66 yo who presents with CLBP and fecal/ urinary leakage which impact QOL, ADL, fitness, and community activities.   Pt's musculoskeletal assessment revealed uneven pelvic girdle and shoulder height, asymmetries to gait pattern, limited spinal /pelvic mobility, dyscoordination and strength of pelvic floor mm, hip weakness, poor body mechanics which places strain  on the abdominal/pelvic floor mm. These are deficits that indicate an ineffective intraabdominal pressure system associated with increased risk for pt's Sx.       Pt will benefit from coordination training and education on fitness and functional positions in order to gain a more effective intraabdominal pressure system to minimize urinary leakage and CLBP.   Pt was provided education on etiology of Sx with anatomy, physiology explanation with images along with the benefits of customized pelvic PT Tx based on pt's medical conditions and musculoskeletal deficits.  Explained the physiology  of deep core mm coordination and roles of pelvic floor function in urination, defecation, sexual function, and postural control with deep core mm system.   Regional interdependent approaches will yield greater benefits in pt's POC due to the complexity of pt's medical Hx and the significant impact their Sx have had on their QOL.   Plan to address uneven pelvic and shoulder height at next session. Assess lower kinetic chain given Hx of R foot surgery to see relationship to pelvic alignment.     Pt benefits from skilled PT.    OBJECTIVE IMPAIRMENTS decreased activity tolerance, decreased coordination, decreased endurance, decreased mobility, difficulty walking, decreased ROM, decreased strength, decreased safety awareness, hypomobility, increased muscle spasms, impaired flexibility, improper body mechanics, postural dysfunction, pain,  scar restrictions   ACTIVITY LIMITATIONS  self-care, home chores, work tasks    PARTICIPATION LIMITATIONS:  community, yoga activities      PERSONAL FACTORS  Psoriatic arthritis, inflammatory, osteoarthris, prior to LBP surgery  (fusion at L1-2 in 2017) , R foot surgery      are also affecting patient's functional outcome.    REHAB POTENTIAL: Good   CLINICAL DECISION MAKING: Evolving/moderate complexity   EVALUATION COMPLEXITY: Moderate    PATIENT EDUCATION:    Education  details: Showed pt anatomy images. Explained muscles attachments/ connection, physiology of deep core system/ spinal- thoracic-pelvis-lower kinetic chain as they relate to pt's presentation, Sx, and past Hx. Explained what and how these areas of deficits need to be restored to balance and function    See Therapeutic activity / neuromuscular re-education section  Answered pt's questions.   Person educated: Patient Education method: Explanation, Demonstration, Tactile cues, Verbal cues, and Handouts Education comprehension: verbalized understanding, returned demonstration, verbal cues required, tactile cues required, and needs further education     PLAN: PT FREQUENCY: 1x/week   PT DURATION: 10 weeks   PLANNED INTERVENTIONS: Therapeutic exercises, Therapeutic activity, Neuromuscular re-education, Balance training, Gait training, Patient/Family education, Self Care, Joint mobilization, Spinal mobilization, Moist heat, Taping, and Manual therapy.   PLAN FOR NEXT SESSION: See clinical impression for plan     GOALS: Goals reviewed with patient? Yes  SHORT TERM GOALS: Target date: 07/28/2022    Pt will demo IND with HEP                    Baseline: Not IND            Goal status: INITIAL   LONG TERM GOALS: Target date:  09/07/2022    1.Pt will demo proper deep core coordination without chest breathing and optimal excursion of diaphragm/pelvic floor in order to promote spinal stability and pelvic floor function  Baseline: dyscoordination Goal status: INITIAL  2.  Pt will demo > 5 pt change on FOTO  to improve QOL and function    PFDI Urinary baseline - 75   PFDI Bowel -75  Lumber baseline  - 53 pts  Goal status: INITIAL  3.  Pt will demo proper body mechanics in against gravity tasks and ADLs  work tasks, fitness  to minimize straining pelvic floor / back                  Baseline: not IND, improper form that places strain on pelvic floor                Goal status:  INITIAL    4.  Pt will be compliant with multifidis mm and thoracolumbar strengthening HEP to decrease LBP by 50%  with gardening > 20-30 min, standing > 3-4 hours, driving 3 hours  Baseline:  bending over a low stool to weed garden 20-30 min, carrying things too heavy, standing 3-4 hours and walking 1 hour, drive 3 hours or more    Goal status: INITIAL    5. Pt will decrease pad wear to < 1 pad a day for urine leakage and < 1 x week for fecal leakage in order to improve QOL  Baseline: wears pads during the day for /incontinence 2 x day. Fecal  leakage occurs 2 x week Goal status: INITIAL   6. Pt will be IND with modifications to yoga practice to minimize risk for injuries in order to return yoga community classes  Baseline:  no education on modifications  Goal status: INITIAL     Jerl Mina, PT 06/29/2022, 1:48 PM

## 2022-06-30 NOTE — Patient Instructions (Signed)
   Sitting with feet on ground, four points of contact Catch yourself crossing ankles and thighs  __

## 2022-07-01 ENCOUNTER — Other Ambulatory Visit: Payer: Self-pay | Admitting: Internal Medicine

## 2022-07-06 ENCOUNTER — Ambulatory Visit: Payer: Medicare HMO | Admitting: Physical Therapy

## 2022-07-06 DIAGNOSIS — R278 Other lack of coordination: Secondary | ICD-10-CM | POA: Diagnosis not present

## 2022-07-06 DIAGNOSIS — M533 Sacrococcygeal disorders, not elsewhere classified: Secondary | ICD-10-CM | POA: Diagnosis not present

## 2022-07-06 DIAGNOSIS — R159 Full incontinence of feces: Secondary | ICD-10-CM | POA: Diagnosis not present

## 2022-07-06 DIAGNOSIS — M5459 Other low back pain: Secondary | ICD-10-CM | POA: Diagnosis not present

## 2022-07-06 DIAGNOSIS — R2689 Other abnormalities of gait and mobility: Secondary | ICD-10-CM

## 2022-07-06 DIAGNOSIS — R32 Unspecified urinary incontinence: Secondary | ICD-10-CM | POA: Diagnosis not present

## 2022-07-06 NOTE — Therapy (Signed)
OUTPATIENT PHYSICAL THERAPY TREATMENT   Patient Name: Madeline Young MRN: [id number] DOB:[birth year], 66 y.o., female Today's Date: 07/06/2022   PT End of Session - 07/06/22 1608     Visit Number 2    Number of Visits 10    Date for PT Re-Evaluation 09/07/22    PT Start Time 6720    PT Stop Time 1546    PT Time Calculation (min) 43 min    Activity Tolerance Patient tolerated treatment well    Behavior During Therapy [facility] for tasks assessed/performed             Past Medical History:  Diagnosis Date   Adenoma 10/06/2008   sigmoid 38m   ADHD (attention deficit hyperactivity disorder)    Allergy    Anxiety    Arthritis    Cataract    bil cateracts removed   Complication of anesthesia    first colonoscopy pt states she woke up   Depression    GERD (gastroesophageal reflux disease)    Globus sensation    Hyperlipidemia    Incontinence    bowels at times   Insomnia    Multinodular goiter (nontoxic)    [place]oriatic arthritis ([place]    Spinal stenosis of lumbar region 03/10/2015   MRI    Statin intolerance 01/27/2013   Past Surgical History:  Procedure Laterality Date   BACK SURGERY  02/17/2016   Dr. JJacqulynn Cadet L4-L5 fusion    BIOPSY THYROID  2014   COLONOSCOPY     COLONOSCOPY W/ POLYPECTOMY     EYE SURGERY     bilateral cataract surgery w/ lens implant   FOOT SURGERY  2006   Right foot , secondary to severe loss of joint ([facility]   POLYPECTOMY     TONSILLECTOMY     UPPER GASTROINTESTINAL ENDOSCOPY  2005   With empiric esophageal dilation   Patient Active Problem List   Diagnosis Date Noted   Urge incontinence 05/16/2022   Onychomycosis 08/17/2021   Drug allergy 06/29/2021   Fibromyalgia 06/29/2021   Generalized osteoarthritis 06/29/2021   Hand joint pain 06/29/2021   Other intervertebral disc degeneration, lumbar region 06/29/2021   Other long term (current) drug therapy 06/29/2021   Fecal smearing 08/10/2020   Sciatica 01/28/2020   Rhinitis,  non-allergic 11/10/2019   Insomnia 08/10/2019   Anxiety 10/11/2018   Carotid artery stenosis, asymptomatic, bilateral 10/29/2016   ADD (attention deficit disorder) 10/29/2016   Spondylolisthesis of lumbar region 02/17/2016   Globus sensation since at least 2005 04/29/2015   GERD (gastroesophageal reflux disease) 11/17/2014   Statin intolerance 01/27/2013   Travel advice encounter 01/27/2013   Multinodular goiter (nontoxic) 08/11/2012   Overweight (BMI 25.0-29.9) 08/11/2012   Psoriatic arthritis, destructive type ([place]    Screening for cervical cancer 08/21/2011   Screening for breast cancer 08/21/2011   Hx of adenomatous polyp of colon 10/06/2008   Familial hyperlipidemia 09/15/2008   Major depressive disorder, single episode, in remission ([place] 09/15/2008   ALLERGIC RHINITIS 09/15/2008    PCP: TDerrel Nip  REFERRING PROVIDER: GLucretia RoersDIAG: R32,R15.9 (ICD-10-CM) - Urinary and fecal incontinence  Rationale for Evaluation and Treatment Rehabilitation  THERAPY DIAG:  Other lack of coordination  Sacrococcygeal disorders, not elsewhere classified  Other abnormalities of gait and mobility  Other low back pain  ONSET DATE:   SUBJECTIVE:  SUBJECTIVE STATEMENT  TODAY:            1) L hip pain / stiffness at L groin 7/10    SUBJECTIVE STATEMENT on EVAL 07/06/22: 1) CLBP: Lumbar fusion 2017, currently 4/10, at worst 10/10 when bending over a low stool to weed garden 20-30 min, carrying things too heavy, standing 3-4 hours and walking 1 hour, drive 3 hours or more . Pt reports stiffness has gone away in the morning with her medications. Stress and diet and weather affect her pain. Pt meditates and rest. Pt was doing yoga 2 x week but she stopped going during Pandemic.   Pt did water aerobic on  her own and she enjoyed that.  Hx of R foot surgery    2) urinary and fecal incontinence:  fecal incontinence started after lumbar surgery 2017.  Currently pt wears pads during the day for /incontinence 2 x day. Fecal  leakage occurs 2 x week and urinary incontinence occurs daily with urge incontinence.  PERTINENT HISTORY:   Psoriatic arthritis, inflammatory, osteoarthris, prior to LBP surgery  (fusion at L1-2 in 2017) , R foot surgery   PAIN:  Are you having pain? Yes: see above   PRECAUTIONS:   NONE   WEIGHT BEARING RESTRICTIONS : NONE   FALLS:  Has patient fallen in last 6 months? 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   PLOF: Independent  PATIENT GOALS                Have more control     OBJECTIVE:    [facility] PT Assessment - 07/06/22 1604       Palpation   SI assessment  L iliac crest and L shoulder higher ( post Tx: levelled)    Palpation comment tightness along glut, IT band, lateral femur/ thigh , paraspinal L               [facility] Adult PT Treatment/Exercise - 07/06/22 1534       Ambulation/Gait   Gait Comments --      Therapeutic Activites    Therapeutic Activities Other Therapeutic Activities    Other Therapeutic Activities see pt education      Neuro Re-ed    Neuro Re-ed Details  cued for HEP , see pt instructions      Modalities   Modalities Moist Heat      Moist Heat Therapy   Number Minutes Moist Heat 5 Minutes    Moist Heat Location --   thoracic, in supine reclined twist ( unbilled)     Manual Therapy   Manual therapy comments STM/MWM at problem areas noted in assessment to REALIGN [place]: See pt instruction section    ASSESSMENT:  CLINICAL IMPRESSION:               Addressed uneven pelvic and shoulder  height with manual Tx . Pt tolerated without complaints. Pt showed levelled pelvic girdle height and reported decreased L hip/ groin pain from 7/10 to 4/10.  Provided HEP with yoga stretches as pt enjoys yoga. Plan to add deep core HEP at upcoming sessions  Plan to assess lower kinetic chain given Hx of R foot surgery to see relationship to pelvic alignment.  Pt benefits from skilled PT.    OBJECTIVE IMPAIRMENTS decreased activity tolerance, decreased coordination, decreased endurance, decreased mobility, difficulty walking, decreased ROM, decreased strength, decreased safety awareness, hypomobility, increased muscle spasms, impaired flexibility, improper body mechanics, postural dysfunction, pain,  scar restrictions   ACTIVITY LIMITATIONS  self-care, home chores, work tasks    PARTICIPATION LIMITATIONS:  community, yoga activities      PERSONAL FACTORS  Psoriatic arthritis, inflammatory, osteoarthris, prior to LBP surgery  (fusion at L1-2 in 2017) , R foot surgery      are also affecting patient's functional outcome.    REHAB POTENTIAL: Good   CLINICAL DECISION MAKING: Evolving/moderate complexity   EVALUATION COMPLEXITY: Moderate    PATIENT EDUCATION:    Education details: Showed pt anatomy images. Explained muscles attachments/ connection, physiology of deep core system/ spinal- thoracic-pelvis-lower kinetic chain as they relate to pt's presentation, Sx, and past Hx. Explained what and how these areas of deficits need to be restored to balance and function    See Therapeutic activity / neuromuscular re-education section  Answered pt's questions.   Person educated: Patient Education method: Explanation, Demonstration, Tactile cues, Verbal cues, and Handouts Education comprehension: verbalized understanding, returned demonstration, verbal cues required, tactile cues required, and needs further education     PLAN: PT FREQUENCY: 1x/week   PT DURATION: 10 weeks   PLANNED  INTERVENTIONS: Therapeutic exercises, Therapeutic activity, Neuromuscular re-education, Balance training, Gait training, Patient/Family education, Self Care, Joint mobilization, Spinal mobilization, Moist heat, Taping, and Manual therapy.   PLAN FOR NEXT SESSION: See clinical impression for plan     GOALS: Goals reviewed with patient? Yes  SHORT TERM GOALS: Target date: 07/28/2022    Pt will demo IND with HEP                    Baseline: Not IND            Goal status: INITIAL   LONG TERM GOALS: Target date:  09/07/2022    1.Pt will demo proper deep core coordination without chest breathing and optimal excursion of diaphragm/pelvic floor in order to promote spinal stability and pelvic floor function  Baseline: dyscoordination Goal status: INITIAL  2.  Pt will demo > 5 pt change on FOTO  to improve QOL and function    PFDI Urinary baseline - 75   PFDI Bowel -75  Lumber baseline  - 53 pts  Goal status: INITIAL  3.  Pt will demo proper body mechanics in against gravity tasks and ADLs  work tasks, fitness  to minimize straining pelvic floor / back                  Baseline: not IND, improper form that places strain on pelvic floor                Goal status: INITIAL    4.  Pt will be compliant with multifidis mm and thoracolumbar strengthening HEP to decrease LBP by 50%  with gardening > 20-30 min, standing > 3-4 hours, driving 3 hours  Baseline:  bending over a low stool to weed garden 20-30 min, carrying things too heavy, standing 3-4 hours and walking 1 hour, drive 3 hours or more    Goal status: INITIAL    5. Pt will decrease pad wear to < 1 pad a day for urine leakage and < 1 x week for fecal leakage in order to improve QOL  Baseline: wears pads during the day for /incontinence 2  x day. Fecal  leakage occurs 2 x week Goal status: INITIAL   6. Pt will be IND with modifications to yoga practice to minimize risk for injuries in order to return yoga community  classes  Baseline:  no education on modifications  Goal status: INITIAL     Jerl Mina, PT 07/06/2022, 4:51 PM

## 2022-07-06 NOTE — Patient Instructions (Signed)
   Side of hip stretch:  Reclined twist for hips and side of the hips/ legs  Lay on your back, knees bend Scoot hips to the R , leave shoulders in place Drop / Rock  knees to the L side resting onto pillows to keep leg at the same width of hips Pillow under L thigh to minimize too much strain   And then also grab R knee with L hand to stretch R  Repeat on other side __    Lengthen Back rib by L  shoulder ( winging)    Lie on R  side , pillow between knees and under head  Pull  L arm overhead over mattress, grab the edge of mattress,pull it upward, drawing elbow away from ears  Breathing 10 reps  Brushing arm with 3/4 turn onto pillow behind back  Lying on R  side ,Pillow/ Block between knees     dragging top forearm across ribs below breast rotating 3/4 turn,  rotating  _L_ only this week ,  relax onto the pillow behind the back  and then back to other palm , maintain top palm on body whole top and not lift shoulder  ___  Lengthen Back rib by R  shoulder   ( winging)    Lie on L  side , pillow between knees and under head  Pull  R arm overhead over mattress, grab the edge of mattress,pull it upward, drawing elbow away from ears  Breathing 10 reps  Brushing arm with 3/4 turn onto pillow behind back  Lying on L  side ,Pillow/ Block between knees     dragging top forearm across ribs below breast rotating 3/4 turn,  rotating  _R_ only this week ,  relax onto the pillow behind the back  and then back to other palm , maintain top palm on body whole top and not lift shoulder

## 2022-07-08 ENCOUNTER — Other Ambulatory Visit: Payer: Self-pay | Admitting: Internal Medicine

## 2022-07-10 ENCOUNTER — Other Ambulatory Visit: Payer: Self-pay | Admitting: Psychiatry

## 2022-07-10 DIAGNOSIS — F3342 Major depressive disorder, recurrent, in full remission: Secondary | ICD-10-CM

## 2022-07-13 ENCOUNTER — Ambulatory Visit: Payer: Medicare HMO | Attending: Internal Medicine | Admitting: Physical Therapy

## 2022-07-13 ENCOUNTER — Telehealth: Payer: Self-pay | Admitting: Physical Therapy

## 2022-07-13 DIAGNOSIS — M5459 Other low back pain: Secondary | ICD-10-CM | POA: Insufficient documentation

## 2022-07-13 DIAGNOSIS — M533 Sacrococcygeal disorders, not elsewhere classified: Secondary | ICD-10-CM | POA: Insufficient documentation

## 2022-07-13 DIAGNOSIS — R278 Other lack of coordination: Secondary | ICD-10-CM | POA: Insufficient documentation

## 2022-07-13 DIAGNOSIS — R2689 Other abnormalities of gait and mobility: Secondary | ICD-10-CM | POA: Insufficient documentation

## 2022-07-13 NOTE — Telephone Encounter (Signed)
Therapist left voicemail re: missed appt today. Requested confirmation for next appt 10/12@ 3pm and to call in advance if need to cancel

## 2022-07-19 ENCOUNTER — Ambulatory Visit: Payer: Medicare HMO | Admitting: Physical Therapy

## 2022-07-19 ENCOUNTER — Telehealth: Payer: Self-pay | Admitting: Internal Medicine

## 2022-07-19 DIAGNOSIS — R2689 Other abnormalities of gait and mobility: Secondary | ICD-10-CM

## 2022-07-19 DIAGNOSIS — M5459 Other low back pain: Secondary | ICD-10-CM | POA: Diagnosis not present

## 2022-07-19 DIAGNOSIS — M159 Polyosteoarthritis, unspecified: Secondary | ICD-10-CM | POA: Diagnosis not present

## 2022-07-19 DIAGNOSIS — M533 Sacrococcygeal disorders, not elsewhere classified: Secondary | ICD-10-CM | POA: Diagnosis not present

## 2022-07-19 DIAGNOSIS — R278 Other lack of coordination: Secondary | ICD-10-CM

## 2022-07-19 DIAGNOSIS — M1120 Other chondrocalcinosis, unspecified site: Secondary | ICD-10-CM | POA: Diagnosis not present

## 2022-07-19 DIAGNOSIS — L4050 Arthropathic psoriasis, unspecified: Secondary | ICD-10-CM | POA: Diagnosis not present

## 2022-07-19 DIAGNOSIS — Z7952 Long term (current) use of systemic steroids: Secondary | ICD-10-CM | POA: Diagnosis not present

## 2022-07-19 NOTE — Patient Instructions (Addendum)
Ankle strengthening on L with band band wrapped around outer L side of foot ballmound of L foot pressing onto band , R foot is placed on top of band hip width apart, with the ballmound ,  R hand holds the band 10 x 3  reps swinging L pinky toe out to the L    3 x day.    ONLY L   ___  Pick up your knees when walking, To land less on your heels   ___   Feet slides :   Points of contact at sitting bones  Four points of contact of foot,  Side knee back while keeping knee out along 2-3rd toe line   Heel up, ankle not twist out Lower heel while keeping knee out along 2-3rd toe line Four points of contact of foot, Slide foot back while keeping knee out along 2-3rd toe line   Repeated with other foot  2 min

## 2022-07-19 NOTE — Therapy (Signed)
OUTPATIENT PHYSICAL THERAPY TREATMENT   Patient Name: Madeline Young MRN: [id number] DOB:05-12-1956, 66 y.o., female Today's Date: 07/19/2022   PT End of Session - 07/19/22 0804     Visit Number 3    Number of Visits 10    Date for PT Re-Evaluation 09/07/22    PT Start Time 0800    PT Stop Time 0853    PT Time Calculation (min) 53 min    Activity Tolerance Patient tolerated treatment well    Behavior During Therapy [facility] for tasks assessed/performed             Past Medical History:  Diagnosis Date   Adenoma 10/06/2008   sigmoid 66m   ADHD (attention deficit hyperactivity disorder)    Allergy    Anxiety    Arthritis    Cataract    bil cateracts removed   Complication of anesthesia    first colonoscopy pt states she woke up   Depression    GERD (gastroesophageal reflux disease)    Globus sensation    Hyperlipidemia    Incontinence    bowels at times   Insomnia    Multinodular goiter (nontoxic)    [place]    Spinal stenosis of lumbar region 03/10/2015   MRI    Statin intolerance 01/27/2013   Past Surgical History:  Procedure Laterality Date   BACK SURGERY  02/17/2016   Dr. JJacqulynn Cadet L4-L5 fusion    BIOPSY THYROID  2014   COLONOSCOPY     COLONOSCOPY W/ POLYPECTOMY     EYE SURGERY     bilateral cataract surgery w/ lens implant   FOOT SURGERY  2006   Right foot , secondary to severe loss of joint ([facility]   POLYPECTOMY     TONSILLECTOMY     UPPER GASTROINTESTINAL ENDOSCOPY  2005   With empiric esophageal dilation   Patient Active Problem List   Diagnosis Date Noted   Urge incontinence 05/16/2022   Onychomycosis 08/17/2021   Drug allergy 06/29/2021   Fibromyalgia 06/29/2021   Generalized osteoarthritis 06/29/2021   Hand joint pain 06/29/2021   Other intervertebral disc degeneration, lumbar region 06/29/2021   Other long term (current) drug therapy 06/29/2021   Fecal smearing 08/10/2020   Sciatica 01/28/2020   Rhinitis,  non-allergic 11/10/2019   Insomnia 08/10/2019   Anxiety 10/11/2018   Carotid artery stenosis, asymptomatic, bilateral 10/29/2016   ADD (attention deficit disorder) 10/29/2016   Spondylolisthesis of lumbar region 02/17/2016   Globus sensation since at least 2005 04/29/2015   GERD (gastroesophageal reflux disease) 11/17/2014   Statin intolerance 01/27/2013   Travel advice encounter 01/27/2013   Multinodular goiter (nontoxic) 08/11/2012   Overweight (BMI 25.0-29.9) 08/11/2012   Psoriatic arthritis, destructive type ([place]    Screening for cervical cancer 08/21/2011   Screening for breast cancer 08/21/2011   Hx of adenomatous polyp of colon 10/06/2008   Familial hyperlipidemia 09/15/2008   Major depressive disorder, single episode, in remission ([place] 09/15/2008   ALLERGIC RHINITIS 09/15/2008    PCP: TDerrel Nip  REFERRING PROVIDER: GLucretia RoersDIAG: R32,R15.9 (ICD-10-CM) - Urinary and fecal incontinence  Rationale for Evaluation and Treatment Rehabilitation  THERAPY DIAG:  Other lack of coordination  Sacrococcygeal disorders, not elsewhere classified  Other abnormalities of gait and mobility  Other low back pain  ONSET DATE:   SUBJECTIVE:  SUBJECTIVE STATEMENT  TODAY:            1) Pt reported her L LBP and groin from last session from 7/10 to 4/10. Pt is eager to learn new exercises.  Pt reports she would like to get injections or prednisone for her pain which she feels she is on the brink of a flare up of inflammation. Pt feels there is something going on her L lower tailbone with sitting. It started a month ago. She thought it was tender and sore and applied cream to it. Pt feels B anterior hip pain and she feels this pain occurs when her foot hits the ground.    SUBJECTIVE STATEMENT on  EVAL 07/06/22: 1) CLBP: Lumbar fusion 2017, currently 4/10, at worst 10/10 when bending over a low stool to weed garden 20-30 min, carrying things too heavy, standing 3-4 hours and walking 1 hour, drive 3 hours or more . Pt reports stiffness has gone away in the morning with her medications. Stress and diet and weather affect her pain. Pt meditates and rest. Pt was doing yoga 2 x week but she stopped going during Pandemic.   Pt did water aerobic on her own and she enjoyed that.  Hx of R foot surgery    2) urinary and fecal incontinence:  fecal incontinence started after lumbar surgery 2017.  Currently pt wears pads during the day for /incontinence 2 x day. Fecal  leakage occurs 2 x week and urinary incontinence occurs daily with urge incontinence.  PERTINENT HISTORY:   Psoriatic arthritis, inflammatory, osteoarthris, prior to LBP surgery  (fusion at L1-2 in 2017) , R foot surgery   PAIN:  Are you having pain? Yes: see above   PRECAUTIONS:   NONE   WEIGHT BEARING RESTRICTIONS : NONE   FALLS:  Has patient fallen in last 6 months? 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   PLOF: Independent  PATIENT GOALS                Have more control     OBJECTIVE:   [facility] PT Assessment - 07/19/22 1037       Strength   Overall Strength Comments L DF/EV 3/5, R 4/5      Palpation   Palpation comment tightness along L foot, between rays II-III, III-IV, hypomobile midfoot,  plantar fascia, lateral leg      Ambulation/Gait   Gait Comments heel striking, supination, metatarsal adductus L > R                [facility] Adult PT Treatment/Exercise - 07/19/22 1038       Therapeutic Activites    Other Therapeutic Activities explained pain science, regional interdependent approach to addressing feet to help minimize L anterior hip  pain, role of feet mechanics for less pain in gait , gait mechanics to decrease feet/ hip pain      Neuro Re-ed    Neuro Re-ed Details  cued for feet mobility HEP and DF/ EV strengthening      Manual Therapy   Manual therapy comments STM/MWM at problem areas noted in assessment to increase mobility at L foot, leg to promote toe extension, DF/ EV                HOME EXERCISE PROGRAM: See pt instruction section  ASSESSMENT:  CLINICAL IMPRESSION:  Pt demo'd levelled pelvic girdle with good carry over from past session.             Addressed lower kinetic chain given Hx of R foot surgery. L foot hypomobility and metatarsal adductus presentation, DF/ EV 3/5 on L,  supination and heel striking were evident in assessment. MOdified manual technique to lighter pressure to minimize pain. Pt showed improved gait mechanics and increase foot mobility post Tx.  Added DF/EV strengthening with resistance band which pt required cues for technique. Added feet mobility and knee propioception exercise which pt demo'd correctly post Tx. Explained regional interdependent approach benefits for her L anterior hip pain.  Plan to continue with this approach to help address pt's goals. Pt benefits from skilled PT.    OBJECTIVE IMPAIRMENTS decreased activity tolerance, decreased coordination, decreased endurance, decreased mobility, difficulty walking, decreased ROM, decreased strength, decreased safety awareness, hypomobility, increased muscle spasms, impaired flexibility, improper body mechanics, postural dysfunction, pain,  scar restrictions   ACTIVITY LIMITATIONS  self-care, home chores, work tasks    PARTICIPATION LIMITATIONS:  community, yoga activities      PERSONAL FACTORS  Psoriatic arthritis, inflammatory, osteoarthris, prior to LBP surgery  (fusion at L1-2 in 2017) , R foot surgery      are also affecting patient's functional outcome.    REHAB POTENTIAL: Good   CLINICAL DECISION MAKING:  Evolving/moderate complexity   EVALUATION COMPLEXITY: Moderate    PATIENT EDUCATION:    Education details: Showed pt anatomy images. Explained muscles attachments/ connection, physiology of deep core system/ spinal- thoracic-pelvis-lower kinetic chain as they relate to pt's presentation, Sx, and past Hx. Explained what and how these areas of deficits need to be restored to balance and function    See Therapeutic activity / neuromuscular re-education section  Answered pt's questions.   Person educated: Patient Education method: Explanation, Demonstration, Tactile cues, Verbal cues, and Handouts Education comprehension: verbalized understanding, returned demonstration, verbal cues required, tactile cues required, and needs further education     PLAN: PT FREQUENCY: 1x/week   PT DURATION: 10 weeks   PLANNED INTERVENTIONS: Therapeutic exercises, Therapeutic activity, Neuromuscular re-education, Balance training, Gait training, Patient/Family education, Self Care, Joint mobilization, Spinal mobilization, Moist heat, Taping, and Manual therapy.   PLAN FOR NEXT SESSION: See clinical impression for plan     GOALS: Goals reviewed with patient? Yes  SHORT TERM GOALS: Target date: 07/28/2022    Pt will demo IND with HEP                    Baseline: Not IND            Goal status: INITIAL   LONG TERM GOALS: Target date:  09/07/2022    1.Pt will demo proper deep core coordination without chest breathing and optimal excursion of diaphragm/pelvic floor in order to promote spinal stability and pelvic floor function  Baseline: dyscoordination Goal status: INITIAL  2.  Pt will demo > 5 pt change on FOTO  to improve QOL and function    PFDI Urinary baseline - 75   PFDI Bowel -75  Lumber baseline  - 53 pts  Goal status: INITIAL  3.  Pt will demo proper body mechanics in against gravity tasks and ADLs  work tasks, fitness  to minimize straining pelvic floor / back                   Baseline: not IND, improper form that places strain on  pelvic floor                Goal status: INITIAL    4.  Pt will be compliant with multifidis mm and thoracolumbar strengthening HEP to decrease LBP by 50%  with gardening > 20-30 min, standing > 3-4 hours, driving 3 hours  Baseline:  bending over a low stool to weed garden 20-30 min, carrying things too heavy, standing 3-4 hours and walking 1 hour, drive 3 hours or more    Goal status: INITIAL    5. Pt will decrease pad wear to < 1 pad a day for urine leakage and < 1 x week for fecal leakage in order to improve QOL  Baseline: wears pads during the day for /incontinence 2 x day. Fecal  leakage occurs 2 x week Goal status: INITIAL   6. Pt will be IND with modifications to yoga practice to minimize risk for injuries in order to return yoga community classes  Baseline:  no education on modifications  Goal status: INITIAL     Jerl Mina, PT 07/19/2022, 11:01 AM

## 2022-07-19 NOTE — Telephone Encounter (Signed)
Patient req CB 07/24/22

## 2022-07-20 ENCOUNTER — Ambulatory Visit: Payer: Medicare HMO | Admitting: Physical Therapy

## 2022-07-26 ENCOUNTER — Ambulatory Visit: Payer: Medicare HMO | Admitting: Physical Therapy

## 2022-07-26 ENCOUNTER — Telehealth: Payer: Self-pay | Admitting: Internal Medicine

## 2022-07-26 DIAGNOSIS — R2689 Other abnormalities of gait and mobility: Secondary | ICD-10-CM

## 2022-07-26 DIAGNOSIS — L821 Other seborrheic keratosis: Secondary | ICD-10-CM | POA: Diagnosis not present

## 2022-07-26 DIAGNOSIS — D225 Melanocytic nevi of trunk: Secondary | ICD-10-CM | POA: Diagnosis not present

## 2022-07-26 DIAGNOSIS — D2272 Melanocytic nevi of left lower limb, including hip: Secondary | ICD-10-CM | POA: Diagnosis not present

## 2022-07-26 DIAGNOSIS — M533 Sacrococcygeal disorders, not elsewhere classified: Secondary | ICD-10-CM | POA: Diagnosis not present

## 2022-07-26 DIAGNOSIS — D2261 Melanocytic nevi of right upper limb, including shoulder: Secondary | ICD-10-CM | POA: Diagnosis not present

## 2022-07-26 DIAGNOSIS — L570 Actinic keratosis: Secondary | ICD-10-CM | POA: Diagnosis not present

## 2022-07-26 DIAGNOSIS — R278 Other lack of coordination: Secondary | ICD-10-CM

## 2022-07-26 DIAGNOSIS — D2271 Melanocytic nevi of right lower limb, including hip: Secondary | ICD-10-CM | POA: Diagnosis not present

## 2022-07-26 DIAGNOSIS — D2262 Melanocytic nevi of left upper limb, including shoulder: Secondary | ICD-10-CM | POA: Diagnosis not present

## 2022-07-26 DIAGNOSIS — X32XXXA Exposure to sunlight, initial encounter: Secondary | ICD-10-CM | POA: Diagnosis not present

## 2022-07-26 DIAGNOSIS — M5459 Other low back pain: Secondary | ICD-10-CM

## 2022-07-26 DIAGNOSIS — L408 Other psoriasis: Secondary | ICD-10-CM | POA: Diagnosis not present

## 2022-07-26 NOTE — Therapy (Unsigned)
OUTPATIENT PHYSICAL THERAPY TREATMENT   Patient Name: Madeline Young MRN: [id number] DOB:06/07/1956, 66 y.o., female Today's Date: 07/26/2022   PT End of Session - 07/26/22 1641     Visit Number 4    Number of Visits 10    Date for PT Re-Evaluation 09/07/22    PT Start Time 28    PT Stop Time 1914    PT Time Calculation (min) 45 min    Activity Tolerance Patient tolerated treatment well    Behavior During Therapy [facility] for tasks assessed/performed             Past Medical History:  Diagnosis Date   Adenoma 10/06/2008   sigmoid 92m   ADHD (attention deficit hyperactivity disorder)    Allergy    Anxiety    Arthritis    Cataract    bil cateracts removed   Complication of anesthesia    first colonoscopy pt states she woke up   Depression    GERD (gastroesophageal reflux disease)    Globus sensation    Hyperlipidemia    Incontinence    bowels at times   Insomnia    Multinodular goiter (nontoxic)    Psoriatic arthritis ([place]    Spinal stenosis of lumbar region 03/10/2015   MRI    Statin intolerance 01/27/2013   Past Surgical History:  Procedure Laterality Date   BACK SURGERY  02/17/2016   Dr. JJacqulynn Cadet L4-L5 fusion    BIOPSY THYROID  2014   COLONOSCOPY     COLONOSCOPY W/ POLYPECTOMY     EYE SURGERY     bilateral cataract surgery w/ lens implant   FOOT SURGERY  2006   Right foot , secondary to severe loss of joint ([place]   POLYPECTOMY     TONSILLECTOMY     UPPER GASTROINTESTINAL ENDOSCOPY  2005   With empiric esophageal dilation   Patient Active Problem List   Diagnosis Date Noted   Urge incontinence 05/16/2022   Onychomycosis 08/17/2021   Drug allergy 06/29/2021   Fibromyalgia 06/29/2021   Generalized osteoarthritis 06/29/2021   Hand joint pain 06/29/2021   Other intervertebral disc degeneration, lumbar region 06/29/2021   Other long term (current) drug therapy 06/29/2021   Fecal smearing 08/10/2020   Sciatica 01/28/2020   Rhinitis,  non-allergic 11/10/2019   Insomnia 08/10/2019   Anxiety 10/11/2018   Carotid artery stenosis, asymptomatic, bilateral 10/29/2016   ADD (attention deficit disorder) 10/29/2016   Spondylolisthesis of lumbar region 02/17/2016   Globus sensation since at least 2005 04/29/2015   GERD (gastroesophageal reflux disease) 11/17/2014   Statin intolerance 01/27/2013   Travel advice encounter 01/27/2013   Multinodular goiter (nontoxic) 08/11/2012   Overweight (BMI 25.0-29.9) 08/11/2012   Psoriatic arthritis, destructive type ([place]    Screening for cervical cancer 08/21/2011   Screening for breast cancer 08/21/2011   Hx of adenomatous polyp of colon 10/06/2008   Familial hyperlipidemia 09/15/2008   Major depressive disorder, single episode, in remission ([place] 09/15/2008   ALLERGIC RHINITIS 09/15/2008    PCP: TDerrel Nip  REFERRING PROVIDER: GLucretia RoersDIAG: R32,R15.9 (ICD-10-CM) - Urinary and fecal incontinence  Rationale for Evaluation and Treatment Rehabilitation  THERAPY DIAG:  Other lack of coordination  Sacrococcygeal disorders, not elsewhere classified  Other abnormalities of gait and mobility  Other low back pain  ONSET DATE:   SUBJECTIVE:  SUBJECTIVE STATEMENT  TODAY 07/26/22 :            Pt had radiating R LBP with driving to [place] after last session. Pt felt she did pretty good with her bowel and bladder issues while with her grandkids.    SUBJECTIVE STATEMENT on EVAL 07/06/22: 1) CLBP: Lumbar fusion 2017, currently 4/10, at worst 10/10 when bending over a low stool to weed garden 20-30 min, carrying things too heavy, standing 3-4 hours and walking 1 hour, drive 3 hours or more . Pt reports stiffness has gone away in the morning with her medications. Stress and diet and weather affect  her pain. Pt meditates and rest. Pt was doing yoga 2 x week but she stopped going during Pandemic.   Pt did water aerobic on her own and she enjoyed that.  Hx of R foot surgery    2) urinary and fecal incontinence:  fecal incontinence started after lumbar surgery 2017.  Currently pt wears pads during the day for /incontinence 2 x day. Fecal  leakage occurs 2 x week and urinary incontinence occurs daily with urge incontinence.  PERTINENT HISTORY:   Psoriatic arthritis, inflammatory, osteoarthris, prior to LBP surgery  (fusion at L1-2 in 2017) , R foot surgery   PAIN:  Are you having pain? Yes: see above   PRECAUTIONS:   NONE   WEIGHT BEARING RESTRICTIONS : NONE   FALLS:  Has patient fallen in last 6 months? 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             Addressed lower kinetic chain given Hx of R foot surgery. L foot hypomobility and metatarsal adductus presentation, DF/ EV 3/5 on L,  supination and heel striking were evident in assessment. MOdified manual technique to lighter pressure to minimize pain. Pt showed improved gait mechanics and increase foot mobility post Tx.  Added DF/EV strengthening with resistance band which pt required cues for technique. Added feet mobility and knee propioception exercise which pt demo'd correctly post Tx. Explained regional interdependent approach benefits for her L anterior hip pain.  Plan to continue with this approach to help address pt's goals. Pt benefits from skilled PT.    OBJECTIVE IMPAIRMENTS  decreased activity tolerance, decreased coordination, decreased endurance, decreased mobility, difficulty walking, decreased ROM, decreased strength, decreased safety awareness, hypomobility, increased muscle spasms, impaired flexibility, improper body mechanics, postural dysfunction, pain,  scar restrictions   ACTIVITY LIMITATIONS  self-care, home chores, work tasks    PARTICIPATION LIMITATIONS:  community, yoga activities      PERSONAL FACTORS  Psoriatic arthritis, inflammatory, osteoarthris, prior to LBP surgery  (fusion at L1-2 in 2017) , R foot surgery      are also affecting patient's functional outcome.    REHAB POTENTIAL: Good   CLINICAL DECISION MAKING: Evolving/moderate complexity   EVALUATION COMPLEXITY: Moderate    PATIENT EDUCATION:    Education details: Showed pt anatomy images. Explained muscles attachments/  connection, physiology of deep core system/ spinal- thoracic-pelvis-lower kinetic chain as they relate to pt's presentation, Sx, and past Hx. Explained what and how these areas of deficits need to be restored to balance and function    See Therapeutic activity / neuromuscular re-education section  Answered pt's questions.   Person educated: Patient Education method: Explanation, Demonstration, Tactile cues, Verbal cues, and Handouts Education comprehension: verbalized understanding, returned demonstration, verbal cues required, tactile cues required, and needs further education     PLAN: PT FREQUENCY: 1x/week   PT DURATION: 10 weeks   PLANNED INTERVENTIONS: Therapeutic exercises, Therapeutic activity, Neuromuscular re-education, Balance training, Gait training, Patient/Family education, Self Care, Joint mobilization, Spinal mobilization, Moist heat, Taping, and Manual therapy.   PLAN FOR NEXT SESSION: See clinical impression for plan     GOALS: Goals reviewed with patient? Yes  SHORT TERM GOALS: Target date: 07/28/2022    Pt will demo IND with HEP                     Baseline: Not IND            Goal status: INITIAL   LONG TERM GOALS: Target date:  09/07/2022    1.Pt will demo proper deep core coordination without chest breathing and optimal excursion of diaphragm/pelvic floor in order to promote spinal stability and pelvic floor function  Baseline: dyscoordination Goal status: INITIAL  2.  Pt will demo > 5 pt change on FOTO  to improve QOL and function    PFDI Urinary baseline - 75   PFDI Bowel -75  Lumber baseline  - 53 pts  Goal status: INITIAL  3.  Pt will demo proper body mechanics in against gravity tasks and ADLs  work tasks, fitness  to minimize straining pelvic floor / back                  Baseline: not IND, improper form that places strain on pelvic floor                Goal status: INITIAL    4.  Pt will be compliant with multifidis mm and thoracolumbar strengthening HEP to decrease LBP by 50%  with gardening > 20-30 min, standing > 3-4 hours, driving 3 hours  Baseline:  bending over a low stool to weed garden 20-30 min, carrying things too heavy, standing 3-4 hours and walking 1 hour, drive 3 hours or more    Goal status: INITIAL    5. Pt will decrease pad wear to < 1 pad a day for urine leakage and < 1 x week for fecal leakage in order to improve QOL  Baseline: wears pads during the day for /incontinence 2 x day. Fecal  leakage occurs 2 x week Goal status: INITIAL   6. Pt will be IND with modifications to yoga practice to minimize risk for injuries in order to return yoga community classes  Baseline:  no education on modifications  Goal status: INITIAL     Jerl Mina, PT 07/26/2022, 4:42 PM

## 2022-07-26 NOTE — Telephone Encounter (Signed)
Copied from [place] [phone]. Topic: Medicare AWV >> Jul 26, 2022 10:34 AM Devoria Glassing wrote: Reason for CRM: Left message for patient to schedule Annual Wellness Visit.  Please schedule with Nurse Health Advisor Denisa O'Brien-Blaney, LPN at [facility]. This appt can be telephone or office visit.  Please call [phone] ask for [facility]

## 2022-07-26 NOTE — Patient Instructions (Signed)
Strengthening feet arches:    Heel raises - heels together, minisquat  -- SEATED   trunk bent , gaze onto floor like you are looking at your reflection over a lake/pond,  Knees bent pointed out like a "v" , navel ( center of mass) more forward  Heels together as you lift, pointed out like a "v"  KNEES ARE ALIGNED BEHIND THE TOES TO [place] your  navel ( center of mass) more forward to a avoid dropping down fast and rocking more weight back onto heels , keep heels pressing against each other the whole time  WHEN YOU LEAN -move heels down    10 reps  ________________   Duanne Guess WITH RESISTANCE BLUE Band at waist connected to doorknob 13mns Stepping forward normal length steps, planting mid and forefoot down, center of mass ( navel) leans forward slightly as if you were walking uphill 3-4 steps till band feels taut ( MAKE SURE THE DOOR IS LOCKED AND WON'T OPEN)   Stepping backwards, lower heel slowly, carry trunk and hips back , leaning forward, front knee along 2-3 rd toe line    3 min  ____

## 2022-07-27 ENCOUNTER — Encounter: Payer: Medicare HMO | Admitting: Physical Therapy

## 2022-08-01 ENCOUNTER — Ambulatory Visit: Payer: Medicare HMO | Admitting: Physical Therapy

## 2022-08-03 ENCOUNTER — Other Ambulatory Visit: Payer: Self-pay | Admitting: Internal Medicine

## 2022-08-03 ENCOUNTER — Encounter: Payer: Medicare HMO | Admitting: Physical Therapy

## 2022-08-09 ENCOUNTER — Encounter: Payer: Self-pay | Admitting: Internal Medicine

## 2022-08-09 ENCOUNTER — Ambulatory Visit (INDEPENDENT_AMBULATORY_CARE_PROVIDER_SITE_OTHER): Payer: Medicare HMO | Admitting: Internal Medicine

## 2022-08-09 DIAGNOSIS — L4052 Psoriatic arthritis mutilans: Secondary | ICD-10-CM

## 2022-08-09 DIAGNOSIS — N3941 Urge incontinence: Secondary | ICD-10-CM | POA: Diagnosis not present

## 2022-08-09 MED ORDER — OXYCODONE-ACETAMINOPHEN 5-325 MG PO TABS: 1.0000 | ORAL_TABLET | Freq: Every day | ORAL | 0 refills | Status: DC | PRN

## 2022-08-09 NOTE — Patient Instructions (Addendum)
DEXA  was ordered in August .  Call [place] to set it up   Oxycodone refill has been [facility] to [facility] [place]  Try taking trospium twice daily

## 2022-08-09 NOTE — Assessment & Plan Note (Addendum)
Using trospium but taking both doses at night . Marland Kitchen Advised to dose every 12 hours.

## 2022-08-09 NOTE — Assessment & Plan Note (Addendum)
Managed with plaquenil and AravA,  With prednisone tapers for flares. Her pain is managed with oxycodone,  Taken not more than twice daily.  Prescription updated and sent to pharmacy . Refill history confirmed via [place] Controlled Substance databas, accessed by me today.Marland Kitchen

## 2022-08-09 NOTE — Progress Notes (Signed)
Subjective:  Patient ID: Madeline Young, female    DOB: 10/24/1955  Age: 66 y.o. 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  Using oxycodone prn  , but since her recent flareup,  has been using less.  Treated flareup with prednisone 40 mg daily and tapering  down by10 mg every few days,  and weaning  staying on 10 mg daily of prednisone  per rheumatology   Outpatient Medications Prior to Visit  Medication Sig Dispense Refill   aspirin 81 MG tablet Take 81 mg by mouth daily.     calcium carbonate (OS-CAL - DOSED IN MG OF ELEMENTAL CALCIUM) 1250 (500 Ca) MG tablet      celecoxib (CELEBREX) 200 MG capsule Take 1 capsule by mouth once daily 90 capsule 0   cholecalciferol (VITAMIN D) 1000 UNITS tablet Take 1,000 Units by mouth daily.     clobetasol (TEMOVATE) 0.05 % external solution as needed.     diclofenac Sodium (VOLTAREN) 1 % GEL APPLY 2 GRAMS TO AFFECTED AREA 4 TIMES A DAY 300 g 3   fluocinonide ointment (LIDEX) 0.05 % APPLY TWICE DAILY TO AFFECTED AREAS UNTIL IMPROVED THEN AS NEEDED FOR FLARES     FLUoxetine (PROZAC) 20 MG capsule TAKE 1 CAPSULE BY MOUTH EVERY DAY 90 capsule 1   hydroxychloroquine (PLAQUENIL) 200 MG tablet Take 400 mg by mouth daily.     ipratropium (ATROVENT) 0.03 % nasal spray Place into both nostrils.     leflunomide (ARAVA) 20 MG tablet Take 20 mg by mouth daily.     lisdexamfetamine (VYVANSE) 60 MG capsule Take 1 capsule (60 mg total) by mouth every morning. 30 capsule 0   lisdexamfetamine (VYVANSE) 70 MG capsule Take 1 capsule (70 mg total) by mouth daily. 30 capsule 0   lisdexamfetamine (VYVANSE) 70 MG capsule Take 1 capsule (70 mg total) by mouth daily. 30 capsule 0   lisdexamfetamine (VYVANSE) 70 MG  capsule Take 1 capsule (70 mg total) by mouth daily. 30 capsule 0   MAGNESIUM-ZINC PO Take 2 tablets by mouth daily.     Melatonin-Pyridoxine (MELATIN PO) Take 5 mg by mouth at bedtime.     Multiple Vitamin (MULTIVITAMIN) tablet Take 1 tablet by mouth daily.     nystatin-triamcinolone ointment (MYCOLOG) Apply 1 application topically 2 (two) times daily. 30 g 1   Omega-3 Fatty Acids (FISH OIL) 1200 MG CAPS Take 1,200 mg by mouth daily.      Polyethyl Glycol-Propyl Glycol 0.4-0.3 % SOLN Place 1 drop into both eyes daily as needed (for dry eyes).     predniSONE (DELTASONE) 10 MG tablet Take 10 mg by mouth 2 (two) times daily. Taking '15mg'$  currently due to flare     triamcinolone cream (KENALOG) 0.1 % APPLY TO AFFECTED AREA TWICE A DAY UNTIL CLEAR THEN AS NEEDED     trospium (SANCTURA) 20 MG tablet TAKE 1 TABLET BY MOUTH TWICE A DAY 180 tablet 1   Turmeric 500 MG TABS Take 1 tablet by mouth daily.     oxyCODONE-acetaminophen (PERCOCET/ROXICET) 5-325 MG tablet Take 1-2 tablets by mouth every 8 (eight) hours as needed for moderate pain. Do not refill less than 30 days from prior refill 72 tablet 0  No facility-administered medications prior to visit.    Review of Systems;  Patient denies headache, fevers, malaise, unintentional weight loss, skin rash, eye pain, sinus congestion and sinus pain, sore throat, dysphagia,  hemoptysis , cough, dyspnea, wheezing, chest pain, palpitations, orthopnea, edema, abdominal pain, nausea, melena, diarrhea, constipation, flank pain, dysuria, hematuria, urinary  Frequency, nocturia, numbness, tingling, seizures,  Focal weakness, Loss of consciousness,  Tremor, insomnia, depression, anxiety, and suicidal ideation.      Objective:  BP 134/86 (BP Location: Left Arm, Patient Position: Sitting, Cuff Size: Small)   Pulse 97   Temp 98 F (36.7 C) (Temporal)   Ht '5\' 6"'$  (1.676 m)   Wt 164 lb 6.4 oz (74.6 kg)   SpO2 99%   BMI 26.53 kg/m   BP Readings from Last 3  Encounters:  08/09/22 134/86  05/25/22 (!) 140/74  05/16/22 126/62    Wt Readings from Last 3 Encounters:  08/09/22 164 lb 6.4 oz (74.6 kg)  05/25/22 163 lb (73.9 kg)  05/16/22 159 lb 6.4 oz (72.3 kg)    General appearance: alert, cooperative and appears stated age Ears: normal TM's and external ear canals both ears Throat: lips, mucosa, and tongue normal; teeth and gums normal Neck: no adenopathy, no carotid bruit, supple, symmetrical, trachea midline and thyroid not enlarged, symmetric, no tenderness/mass/nodules Back: symmetric, no curvature. ROM normal. No CVA tenderness. Lungs: clear to auscultation bilaterally Heart: regular rate and rhythm, S1, S2 normal, no murmur, click, rub or gallop Abdomen: soft, non-tender; bowel sounds normal; no masses,  no organomegaly Pulses: 2+ and symmetric Skin: Skin color, texture, turgor normal. No rashes or lesions Lymph nodes: Cervical, supraclavicular, and axillary nodes normal. Neuro:  awake and interactive with normal mood and affect. Higher cortical functions are normal. Speech is clear without word-finding difficulty or dysarthria. Extraocular movements are intact. Visual fields of both eyes are grossly intact. Sensation to light touch is grossly intact bilaterally of upper and lower extremities. Motor examination shows 4+/5 symmetric hand grip and upper extremity and 5/5 lower extremity strength. There is no pronation or drift. Gait is non-ataxic   Lab Results  Component Value Date   HGBA1C 6.1 01/31/2022    Lab Results  Component Value Date   CREATININE 0.72 11/09/2021   CREATININE 0.72 02/09/2021   CREATININE 0.68 08/09/2020    Lab Results  Component Value Date   WBC 5.4 01/31/2022   HGB 12.3 01/31/2022   HCT 36.7 01/31/2022   PLT 233.0 01/31/2022   GLUCOSE 127 (H) 11/09/2021   CHOL 333 (H) 01/31/2022   TRIG 89.0 01/31/2022   HDL 80.60 01/31/2022   LDLDIRECT 223.0 01/31/2022   LDLCALC 234 (H) 01/31/2022   ALT 17  11/09/2021   AST 17 11/09/2021   NA 138 11/09/2021   K 4.1 11/09/2021   CL 102 11/09/2021   CREATININE 0.72 11/09/2021   BUN 24 (H) 11/09/2021   CO2 32 11/09/2021   TSH 0.96 01/31/2022   HGBA1C 6.1 01/31/2022   MICROALBUR 1.7 01/31/2022    MM DIAG BREAST TOMO UNI LEFT  Result Date: 11/14/2021 CLINICAL DATA:  66 year old female presenting as a recall from screening for possible left breast asymmetry. EXAM: DIGITAL DIAGNOSTIC UNILATERAL LEFT MAMMOGRAM WITH TOMOSYNTHESIS AND CAD TECHNIQUE: Left digital diagnostic mammography and breast tomosynthesis was performed. The images were evaluated with computer-aided detection. COMPARISON:  Previous exam(s). ACR Breast Density Category b: There are scattered areas of fibroglandular density. FINDINGS: Full field cc and mL as well as spot compression  tomosynthesis CC views of the left breast were performed for a questioned asymmetry seen only cc view in the outer anterior left breast. On the additional imaging the asymmetry resolves and most likely represented normal overlapping fibroglandular tissue. There is no mass or distortion. There are no new findings elsewhere in the left breast. IMPRESSION: No mammographic evidence of malignancy in the left breast. RECOMMENDATION: Screening mammogram in one year.(Code:SM-B-01Y) I have discussed the findings and recommendations with the patient. If applicable, a reminder letter will be sent to the patient regarding the next appointment. BI-RADS CATEGORY  1: Negative. Electronically Signed   By: Audie Pinto M.D.   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 Her pain is managed with oxycodone,  Taken not more than twice daily.  Prescription updated and sent to pharmacy . Refill history confirmed via [place] Controlled Substance databas, accessed by me  today..      Relevant Medications   oxyCODONE-acetaminophen (PERCOCET/ROXICET) 5-325 MG tablet   Urge incontinence    Using trospium but taking both doses at night . Marland Kitchen Advised to dose every 12 hours.        Follow-up: Return in about 3 months (around 11/09/2022).   Crecencio Mc, MD

## 2022-08-10 ENCOUNTER — Ambulatory Visit: Payer: Medicare HMO | Attending: Internal Medicine | Admitting: Physical Therapy

## 2022-08-10 DIAGNOSIS — R2689 Other abnormalities of gait and mobility: Secondary | ICD-10-CM | POA: Diagnosis not present

## 2022-08-10 DIAGNOSIS — M5459 Other low back pain: Secondary | ICD-10-CM | POA: Diagnosis not present

## 2022-08-10 DIAGNOSIS — R278 Other lack of coordination: Secondary | ICD-10-CM | POA: Insufficient documentation

## 2022-08-10 DIAGNOSIS — M533 Sacrococcygeal disorders, not elsewhere classified: Secondary | ICD-10-CM | POA: Insufficient documentation

## 2022-08-10 NOTE — Therapy (Signed)
OUTPATIENT PHYSICAL THERAPY TREATMENT   Patient Name: Madeline Young MRN: [id number] DOB:1956/08/08, 66 y.o., female Today's Date: 08/10/2022   PT End of Session - 08/10/22 1342     Visit Number 5    Number of Visits 10    Date for PT Re-Evaluation 09/07/22    PT Start Time 1440    PT Stop Time 1518    PT Time Calculation (min) 38 min    Activity Tolerance Patient tolerated treatment well    Behavior During Therapy [facility] for tasks assessed/performed             Past Medical History:  Diagnosis Date   Adenoma 10/06/2008   sigmoid 101m   ADHD (attention deficit hyperactivity disorder)    Allergy    Anxiety    Arthritis    Cataract    bil cateracts removed   Complication of anesthesia    first colonoscopy pt states she woke up   Depression    GERD (gastroesophageal reflux disease)    Globus sensation    Hyperlipidemia    Incontinence    bowels at times   Insomnia    Multinodular goiter (nontoxic)    Psoriatic arthritis ([place]    Spinal stenosis of lumbar region 03/10/2015   MRI    Statin intolerance 01/27/2013   Past Surgical History:  Procedure Laterality Date   BACK SURGERY  02/17/2016   Dr. JJacqulynn Cadet L4-L5 fusion    BIOPSY THYROID  2014   COLONOSCOPY     COLONOSCOPY W/ POLYPECTOMY     EYE SURGERY     bilateral cataract surgery w/ lens implant   FOOT SURGERY  2006   Right foot , secondary to severe loss of joint ([facility]   POLYPECTOMY     TONSILLECTOMY     UPPER GASTROINTESTINAL ENDOSCOPY  2005   With empiric esophageal dilation   Patient Active Problem List   Diagnosis Date Noted   Urge incontinence 05/16/2022   Onychomycosis 08/17/2021   Drug allergy 06/29/2021   Fibromyalgia 06/29/2021   Generalized osteoarthritis 06/29/2021   Hand joint pain 06/29/2021   Other intervertebral disc degeneration, lumbar region 06/29/2021   Other long term (current) drug therapy 06/29/2021   Fecal smearing 08/10/2020   Sciatica 01/28/2020   Rhinitis,  non-allergic 11/10/2019   Insomnia 08/10/2019   Anxiety 10/11/2018   Carotid artery stenosis, asymptomatic, bilateral 10/29/2016   ADD (attention deficit disorder) 10/29/2016   Spondylolisthesis of lumbar region 02/17/2016   Globus sensation since at least 2005 04/29/2015   GERD (gastroesophageal reflux disease) 11/17/2014   Statin intolerance 01/27/2013   Travel advice encounter 01/27/2013   Multinodular goiter (nontoxic) 08/11/2012   Overweight (BMI 25.0-29.9) 08/11/2012   Psoriatic arthritis, destructive type ([place]    Screening for cervical cancer 08/21/2011   Screening for breast cancer 08/21/2011   Hx of adenomatous polyp of colon 10/06/2008   Familial hyperlipidemia 09/15/2008   Major depressive disorder, single episode, in remission ([place] 09/15/2008   ALLERGIC RHINITIS 09/15/2008    PCP: TDerrel Nip  REFERRING PROVIDER: GLucretia RoersDIAG: R32,R15.9 (ICD-10-CM) - Urinary and fecal incontinence  Rationale for Evaluation and Treatment Rehabilitation  THERAPY DIAG:  Other lack of coordination  Other abnormalities of gait and mobility  Sacrococcygeal disorders, not elsewhere classified  Other low back pain  ONSET DATE:   SUBJECTIVE:  SUBJECTIVE STATEMENT  TODAY 08/10/22 :            Pt did not have radiating pain when riding in a car this past trip and it only was located in the back.  Pt reports no LBP right now.  Fecal / urine leakages still occurs 2 x week.    SUBJECTIVE STATEMENT on EVAL 07/06/22: 1) CLBP: Lumbar fusion 2017, currently 4/10, at worst 10/10 when bending over a low stool to weed garden 20-30 min, carrying things too heavy, standing 3-4 hours and walking 1 hour, drive 3 hours or more . Pt reports stiffness has gone away in the morning with her medications. Stress and  diet and weather affect her pain. Pt meditates and rest. Pt was doing yoga 2 x week but she stopped going during Pandemic.   Pt did water aerobic on her own and she enjoyed that.  Hx of R foot surgery    2) urinary and fecal incontinence:  fecal incontinence started after lumbar surgery 2017.  Currently pt wears pads during the day for /incontinence 2 x day. Fecal  leakage occurs 2 x week and urinary incontinence occurs daily with urge incontinence.  PERTINENT HISTORY:   Psoriatic arthritis, inflammatory, osteoarthris, prior to LBP surgery  (fusion at L1-2 in 2017) , R foot surgery   PAIN:  Are you having pain? Yes: see above   PRECAUTIONS:   NONE   WEIGHT BEARING RESTRICTIONS : NONE   FALLS:  Has patient fallen in last 6 months? 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   PLOF: Independent  PATIENT GOALS                Have more control     OBJECTIVE:        [facility] Adult PT Treatment/Exercise - 08/10/22 1410       Therapeutic Activites    Other Therapeutic Activities gait training with cues for less heel striking and more abducted knee, wider BOS,      Neuro Re-ed    Neuro Re-ed Details  cued for CKC foot mobility, COM, propioception walking with resistance band at waist      Manual Therapy   Manual therapy comments STM/MWM at problem areas noted in assessment to increase mobility at R foot, leg to promote toe extension, DF/ EV in [place]: See pt instruction section    ASSESSMENT:  CLINICAL IMPRESSION:              Continued to further addressed her lower kinetic chain given Hx of R foot surgery. R/ L  foot hypomobility improved post Tx. Added feet mobility and propioception exercise with cues for more knee alignment and stability with SLS and more transverse  arch co-activation / less heel striking.  Continue to apply regional interdependent approach to help address pt's goals. Pt benefits from skilled PT. Plan to assess pelvic floor and add deep core HEP at next session.     OBJECTIVE IMPAIRMENTS decreased activity tolerance, decreased coordination, decreased endurance, decreased mobility, difficulty walking, decreased ROM, decreased strength, decreased safety awareness, hypomobility, increased muscle spasms, impaired flexibility, improper body mechanics, postural dysfunction, pain,  scar restrictions   ACTIVITY LIMITATIONS  self-care, home chores, work tasks  PARTICIPATION LIMITATIONS:  community, yoga activities      PERSONAL FACTORS  Psoriatic arthritis, inflammatory, osteoarthris, prior to LBP surgery  (fusion at L1-2 in 2017) , R foot surgery      are also affecting patient's functional outcome.    REHAB POTENTIAL: Good   CLINICAL DECISION MAKING: Evolving/moderate complexity   EVALUATION COMPLEXITY: Moderate    PATIENT EDUCATION:    Education details: Showed pt anatomy images. Explained muscles attachments/ connection, physiology of deep core system/ spinal- thoracic-pelvis-lower kinetic chain as they relate to pt's presentation, Sx, and past Hx. Explained what and how these areas of deficits need to be restored to balance and function    See Therapeutic activity / neuromuscular re-education section  Answered pt's questions.   Person educated: Patient Education method: Explanation, Demonstration, Tactile cues, Verbal cues, and Handouts Education comprehension: verbalized understanding, returned demonstration, verbal cues required, tactile cues required, and needs further education     PLAN: PT FREQUENCY: 1x/week   PT DURATION: 10 weeks   PLANNED INTERVENTIONS: Therapeutic exercises, Therapeutic activity, Neuromuscular re-education, Balance training, Gait training, Patient/Family education, Self Care, Joint mobilization,  Spinal mobilization, Moist heat, Taping, and Manual therapy.   PLAN FOR NEXT SESSION: See clinical impression for plan     GOALS: Goals reviewed with patient? Yes  SHORT TERM GOALS: Target date: 07/28/2022    Pt will demo IND with HEP                    Baseline: Not IND            Goal status: INITIAL   LONG TERM GOALS: Target date:  09/07/2022    1.Pt will demo proper deep core coordination without chest breathing and optimal excursion of diaphragm/pelvic floor in order to promote spinal stability and pelvic floor function  Baseline: dyscoordination Goal status: INITIAL  2.  Pt will demo > 5 pt change on FOTO  to improve QOL and function    PFDI Urinary baseline - 75   PFDI Bowel -75  Lumber baseline  - 53 pts  Goal status: INITIAL  3.  Pt will demo proper body mechanics in against gravity tasks and ADLs  work tasks, fitness  to minimize straining pelvic floor / back                  Baseline: not IND, improper form that places strain on pelvic floor                Goal status: INITIAL    4.  Pt will be compliant with multifidis mm and thoracolumbar strengthening HEP to decrease LBP by 50%  with gardening > 20-30 min, standing > 3-4 hours, driving 3 hours  Baseline:  bending over a low stool to weed garden 20-30 min, carrying things too heavy, standing 3-4 hours and walking 1 hour, drive 3 hours or more    Goal status: INITIAL    5. Pt will decrease pad wear to < 1 pad a day for urine leakage and < 1 x week for fecal leakage in order to improve QOL  Baseline: wears pads during the day for /incontinence 2 x day. Fecal  leakage occurs 2 x week Goal status: INITIAL   6. Pt will be IND with modifications to yoga practice to minimize risk for injuries in order to return yoga community classes  Baseline:  no education on modifications  Goal status: INITIAL     Jerl Mina, PT 08/10/2022, 5:18  PM

## 2022-08-10 NOTE — Patient Instructions (Signed)
Feet care :  Self -feet massage   Handshake : fingers between toes, moving ballmounds/toes back and forth several times while other hand anchors at arch. Do the same at the hind/mid foot.  Heel to toes upward to a letter Big Letter T strokes to spread ballmounds and toes, several times, pinch between webs of toes  Run finger tips along top of foot between long bones "comb between the bones"    Wiggle toes and spread them out when relaxing  __     [place] at waist connected to doorknob 57mns Stepping forward normal length steps, planting mid and forefoot down, center of mass ( navel) leans forward slightly as if you were walking uphill 3-4 steps till band feels taut ( MAKE SURE THE DOOR IS LOCKED AND WON'T OPEN)   Stepping backwards, lower heel slowly, carry trunk and hips back , leaning forward, front knee along 2-3 rd toe line    2 min  ____

## 2022-08-17 ENCOUNTER — Ambulatory Visit (INDEPENDENT_AMBULATORY_CARE_PROVIDER_SITE_OTHER): Payer: Medicare HMO

## 2022-08-17 ENCOUNTER — Ambulatory Visit: Payer: Medicare HMO | Admitting: Physical Therapy

## 2022-08-17 VITALS — Ht 66.0 in | Wt 164.0 lb

## 2022-08-17 DIAGNOSIS — R2689 Other abnormalities of gait and mobility: Secondary | ICD-10-CM

## 2022-08-17 DIAGNOSIS — R278 Other lack of coordination: Secondary | ICD-10-CM

## 2022-08-17 DIAGNOSIS — Z0000 Encounter for general adult medical examination without abnormal findings: Secondary | ICD-10-CM

## 2022-08-17 DIAGNOSIS — M5459 Other low back pain: Secondary | ICD-10-CM

## 2022-08-17 DIAGNOSIS — M533 Sacrococcygeal disorders, not elsewhere classified: Secondary | ICD-10-CM

## 2022-08-17 NOTE — Therapy (Signed)
OUTPATIENT PHYSICAL THERAPY TREATMENT   Patient Name: Madeline Young MRN: [id number] DOB:1956-02-08, 66 y.o., female Today's Date: 08/17/2022   PT End of Session - 08/17/22 1509     Visit Number 6    Number of Visits 10    Date for PT Re-Evaluation 09/07/22    PT Start Time 6629    PT Stop Time 4765    PT Time Calculation (min) 42 min    Activity Tolerance Patient tolerated treatment well    Behavior During Therapy [facility] for tasks assessed/performed             Past Medical History:  Diagnosis Date   Adenoma 10/06/2008   sigmoid 46m   ADHD (attention deficit hyperactivity disorder)    Allergy    Anxiety    Arthritis    Cataract    bil cateracts removed   Complication of anesthesia    first colonoscopy pt states she woke up   Depression    GERD (gastroesophageal reflux disease)    Globus sensation    Hyperlipidemia    Incontinence    bowels at times   Insomnia    Multinodular goiter (nontoxic)    Psoriatic arthritis ([place]    Spinal stenosis of lumbar region 03/10/2015   MRI    Statin intolerance 01/27/2013   Past Surgical History:  Procedure Laterality Date   BACK SURGERY  02/17/2016   Dr. JJacqulynn Cadet L4-L5 fusion    BIOPSY THYROID  2014   COLONOSCOPY     COLONOSCOPY W/ POLYPECTOMY     EYE SURGERY     bilateral cataract surgery w/ lens implant   FOOT SURGERY  2006   Right foot , secondary to severe loss of joint ([facility]   POLYPECTOMY     TONSILLECTOMY     UPPER GASTROINTESTINAL ENDOSCOPY  2005   With empiric esophageal dilation   Patient Active Problem List   Diagnosis Date Noted   Urge incontinence 05/16/2022   Onychomycosis 08/17/2021   Drug allergy 06/29/2021   Fibromyalgia 06/29/2021   Generalized osteoarthritis 06/29/2021   Hand joint pain 06/29/2021   Other intervertebral disc degeneration, lumbar region 06/29/2021   Other long term (current) drug therapy 06/29/2021   Fecal smearing 08/10/2020   Sciatica 01/28/2020   Rhinitis,  non-allergic 11/10/2019   Insomnia 08/10/2019   Anxiety 10/11/2018   Carotid artery stenosis, asymptomatic, bilateral 10/29/2016   ADD (attention deficit disorder) 10/29/2016   Spondylolisthesis of lumbar region 02/17/2016   Globus sensation since at least 2005 04/29/2015   GERD (gastroesophageal reflux disease) 11/17/2014   Statin intolerance 01/27/2013   Travel advice encounter 01/27/2013   Multinodular goiter (nontoxic) 08/11/2012   Overweight (BMI 25.0-29.9) 08/11/2012   Psoriatic arthritis, destructive type ([place]    Screening for cervical cancer 08/21/2011   Screening for breast cancer 08/21/2011   Hx of adenomatous polyp of colon 10/06/2008   Familial hyperlipidemia 09/15/2008   Major depressive disorder, single episode, in remission ([place] 09/15/2008   ALLERGIC RHINITIS 09/15/2008    PCP: TDerrel Nip  REFERRING PROVIDER: GLucretia RoersDIAG: R32,R15.9 (ICD-10-CM) - Urinary and fecal incontinence  Rationale for Evaluation and Treatment Rehabilitation  THERAPY DIAG:  Other lack of coordination  Other abnormalities of gait and mobility  Sacrococcygeal disorders, not elsewhere classified  Other low back pain  ONSET DATE:   SUBJECTIVE:  SUBJECTIVE STATEMENT  TODAY :            Pt pulled a back mm while carrying a box of [place] upstairs. It has been painful at 8/10 in her lower R hip. It has lasted for 4 days. It does not travel down her legs.    SUBJECTIVE STATEMENT on EVAL 07/06/22: 1) CLBP: Lumbar fusion 2017, currently 4/10, at worst 10/10 when bending over a low stool to weed garden 20-30 min, carrying things too heavy, standing 3-4 hours and walking 1 hour, drive 3 hours or more . Pt reports stiffness has gone away in the morning with her medications. Stress and diet and weather  affect her pain. Pt meditates and rest. Pt was doing yoga 2 x week but she stopped going during Pandemic.   Pt did water aerobic on her own and she enjoyed that.  Hx of R foot surgery    2) urinary and fecal incontinence:  fecal incontinence started after lumbar surgery 2017.  Currently pt wears pads during the day for /incontinence 2 x day. Fecal  leakage occurs 2 x week and urinary incontinence occurs daily with urge incontinence.  PERTINENT HISTORY:   Psoriatic arthritis, inflammatory, osteoarthris, prior to LBP surgery  (fusion at L1-2 in 2017) , R foot surgery   PAIN:  Are you having pain? Yes: see above   PRECAUTIONS:   NONE   WEIGHT BEARING RESTRICTIONS : NONE   FALLS:  Has patient fallen in last 6 months? 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 Explained progress of Tx is to help pt minimzie risk for yoga -related injuries as pt is eager to return to yoga.      Neuro Re-ed    Neuro Re-ed Details  cued for stretch at counter ( modified yoga) to help minimzie back spasm      Modalities   Modalities Moist Heat      Moist Heat Therapy   Number Minutes Moist Heat 5 Minutes    Moist Heat Location --   thoracic, in  supine reclined twist ( unbilled)     Manual Therapy   Manual therapy comments STM/MWM at problem areas noted in assessment, superior glide at iliac crest to promote ER / abd               HOME EXERCISE PROGRAM: See pt instruction section    ASSESSMENT:  CLINICAL IMPRESSION: Pt had a back mm spasm after carrying a box of [place] upstairs. Pt arrived to day with obliquities of pelvic girdle, increased mm tightness  which were addressed. Pt reported decreased pain at L low back after Tx. Provided modified yoga posture to accommodate pt's interest to return to yoga while also addressing pt's asymmetries.   Explained the progression of customized PT before returning to yoga because asymmetries of spinal/ pelvic alignment and mm imbalances lower kinetic chain must be addressed first before returning to yoga which will involve bilateral movements . Explained progress of Tx is to help pt minimzie risk for yoga -related injuries as pt is eager to return to yoga.     Pt benefits from skilled PT. Plan to assess pelvic floor and add deep core HEP / multifidis mm strengthening at next session.     OBJECTIVE IMPAIRMENTS decreased activity tolerance, decreased coordination, decreased endurance, decreased mobility, difficulty walking, decreased ROM, decreased strength, decreased safety awareness, hypomobility, increased muscle spasms, impaired flexibility, improper body mechanics, postural dysfunction, pain,  scar restrictions   ACTIVITY LIMITATIONS  self-care, home chores, work tasks    PARTICIPATION LIMITATIONS:  community, yoga activities      PERSONAL FACTORS  Psoriatic arthritis, inflammatory, osteoarthris, prior to LBP surgery  (fusion at L1-2 in 2017) , R foot surgery      are also affecting patient's functional outcome.    REHAB POTENTIAL: Good   CLINICAL DECISION MAKING: Evolving/moderate complexity   EVALUATION COMPLEXITY: Moderate    PATIENT EDUCATION:    Education details:  Showed pt anatomy images. Explained muscles attachments/ connection, physiology of deep core system/ spinal- thoracic-pelvis-lower kinetic chain as they relate to pt's presentation, Sx, and past Hx. Explained what and how these areas of deficits need to be restored to balance and function    See Therapeutic activity / neuromuscular re-education section  Answered pt's questions.   Person educated: Patient Education method: Explanation, Demonstration, Tactile cues, Verbal cues, and Handouts Education comprehension: verbalized understanding, returned demonstration, verbal cues required, tactile cues required, and needs further education     PLAN: PT FREQUENCY: 1x/week   PT DURATION: 10 weeks   PLANNED INTERVENTIONS: Therapeutic exercises, Therapeutic activity, Neuromuscular re-education, Balance training, Gait training, Patient/Family education, Self Care, Joint mobilization, Spinal mobilization, Moist heat, Taping, and Manual therapy.   PLAN FOR NEXT SESSION: See clinical impression for plan     GOALS: Goals reviewed with patient? Yes  SHORT TERM GOALS: Target date: 07/28/2022    Pt will demo IND with HEP                    Baseline: Not IND            Goal status: INITIAL   LONG TERM GOALS: Target date:  09/07/2022    1.Pt will demo proper deep core coordination without chest breathing and optimal excursion of diaphragm/pelvic floor in order to promote spinal stability and pelvic floor function  Baseline: dyscoordination Goal status: INITIAL  2.  Pt will demo > 5 pt change on FOTO  to improve QOL and function    PFDI Urinary baseline - 75   PFDI Bowel -75  Lumber baseline  - 53 pts  Goal status: INITIAL  3.  Pt will demo proper body mechanics in against gravity tasks and ADLs  work tasks, fitness  to minimize straining pelvic floor / back                  Baseline: not IND, improper form that places strain on pelvic floor                Goal  status:  INITIAL    4.  Pt will be compliant with multifidis mm and thoracolumbar strengthening HEP to decrease LBP by 50%  with gardening > 20-30 min, standing > 3-4 hours, driving 3 hours  Baseline:  bending over a low stool to weed garden 20-30 min, carrying things too heavy, standing 3-4 hours and walking 1 hour, drive 3 hours or more    Goal status: INITIAL    5. Pt will decrease pad wear to < 1 pad a day for urine leakage and < 1 x week for fecal leakage in order to improve QOL  Baseline: wears pads during the day for /incontinence 2 x day. Fecal  leakage occurs 2 x week Goal status: INITIAL   6. Pt will be IND with modifications to yoga practice to minimize risk for injuries in order to return yoga community classes  Baseline:  no education on modifications  Goal status: INITIAL     Jerl Mina, PT 08/17/2022, 3:09 PM

## 2022-08-17 NOTE — Progress Notes (Addendum)
Subjective:   Madeline Young is a 66 y.o. female who presents for Medicare Annual (Subsequent) preventive examination.  Review of Systems    No ROS.  Medicare Wellness Virtual Visit.  Visual/audio telehealth visit, UTA vital signs.   See social history for additional risk factors.   Cardiac Risk Factors include: advanced age (>35mn, >>6women)     Objective:    Today's Vitals   08/17/22 1110  Weight: 164 lb (74.4 kg)  Height: '5\' 6"'$  (1.676 m)   Body mass index is 26.47 kg/m.     08/17/2022   11:55 AM 06/29/2022    1:46 PM 07/19/2021   10:42 AM 07/16/2020   10:48 AM 07/16/2019   10:13 AM 07/10/2018   12:59 PM 03/17/2016   11:05 AM  Advanced Directives  Does Patient Have a Medical Advance Directive? Yes Yes Yes Yes Yes Yes No  Type of AParamedicof [place]uck[place]Living will  HA[place]Living will HSeven MileLiving will [place]onal[place]onvilleLiving will [place]ford[place]Living will   Does patient want to make changes to medical advance directive? No - Patient declined  No - Patient declined No - Patient declined No - Patient declined No - Patient declined   Copy of H[place]in Chart? Yes - validated most recent copy scanned in chart (See row information)  Yes - validated most recent copy scanned in chart (See row information) Yes - validated most recent copy scanned in chart (See row information) No - copy requested No - copy requested   Would patient like information on creating a medical advance directive?       No - patient declined information    Current Medications (verified) Outpatient Encounter Medications as of 08/17/2022  Medication Sig   aspirin 81 MG tablet Take 81 mg by mouth daily.   calcium carbonate (OS-CAL - DOSED IN MG OF ELEMENTAL CALCIUM) 1250 (500 Ca) MG tablet    celecoxib (CELEBREX) 200 MG capsule Take 1 capsule by mouth once daily   cholecalciferol (VITAMIN D)  1000 UNITS tablet Take 1,000 Units by mouth daily.   clobetasol (TEMOVATE) 0.05 % external solution as needed.   diclofenac Sodium (VOLTAREN) 1 % GEL APPLY 2 GRAMS TO AFFECTED AREA 4 TIMES A DAY   fluocinonide ointment (LIDEX) 0.05 % APPLY TWICE DAILY TO AFFECTED AREAS UNTIL IMPROVED THEN AS NEEDED FOR FLARES   FLUoxetine (PROZAC) 20 MG capsule TAKE 1 CAPSULE BY MOUTH EVERY DAY   hydroxychloroquine (PLAQUENIL) 200 MG tablet Take 400 mg by mouth daily.   ipratropium (ATROVENT) 0.03 % nasal spray Place into both nostrils.   leflunomide (ARAVA) 20 MG tablet Take 20 mg by mouth daily.   lisdexamfetamine (VYVANSE) 60 MG capsule Take 1 capsule (60 mg total) by mouth every morning.   lisdexamfetamine (VYVANSE) 70 MG capsule Take 1 capsule (70 mg total) by mouth daily.   lisdexamfetamine (VYVANSE) 70 MG capsule Take 1 capsule (70 mg total) by mouth daily.   lisdexamfetamine (VYVANSE) 70 MG capsule Take 1 capsule (70 mg total) by mouth daily.   MAGNESIUM-ZINC PO Take 2 tablets by mouth daily.   Melatonin-Pyridoxine (MELATIN PO) Take 5 mg by mouth at bedtime.   Multiple Vitamin (MULTIVITAMIN) tablet Take 1 tablet by mouth daily.   nystatin-triamcinolone ointment (MYCOLOG) Apply 1 application topically 2 (two) times daily.   Omega-3 Fatty Acids (FISH OIL) 1200 MG CAPS Take 1,200 mg by mouth daily.  oxyCODONE-acetaminophen (PERCOCET/ROXICET) 5-325 MG tablet Take 1-2 tablets by mouth daily as needed for moderate pain. Do not refill less than 30 days from prior refill   Polyethyl Glycol-Propyl Glycol 0.4-0.3 % SOLN Place 1 drop into both eyes daily as needed (for dry eyes).   predniSONE (DELTASONE) 10 MG tablet Take 10 mg by mouth 2 (two) times daily. Taking '15mg'$  currently due to flare   triamcinolone cream (KENALOG) 0.1 % APPLY TO AFFECTED AREA TWICE A DAY UNTIL CLEAR THEN AS NEEDED   trospium (SANCTURA) 20 MG tablet TAKE 1 TABLET BY MOUTH TWICE A DAY   Turmeric 500 MG TABS Take 1 tablet by mouth daily.    No facility-administered encounter medications on file as of 08/17/2022.    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 JJacqulynn Cadet L4-L5 fusion    BIOPSY THYROID  2014   COLONOSCOPY     COLONOSCOPY W/ POLYPECTOMY     EYE SURGERY     bilateral cataract surgery w/ lens implant   FOOT SURGERY  2006   Right foot , secondary to severe loss of joint ([place])   POLYPECTOMY     TONSILLECTOMY     UPPER GASTROINTESTINAL ENDOSCOPY  2005   With empiric esophageal dilation   Family History  Problem Relation Age of Onset   Heart disease Mother    Hyperlipidemia Mother    Coronary artery disease Father 590      CABG in early 520's  Hyperlipidemia Father    Epilepsy Grandchild        Severe form   Colon cancer Neg Hx    Esophageal cancer Neg Hx    Rectal cancer Neg Hx    Stomach cancer Neg Hx    Colon polyps Neg Hx    Crohn's disease Neg Hx    Social History   Socioeconomic History   Marital status: Married    Spouse name: Not on file   Number of children: 2   Years of education: Not on file   Highest education level: Not on file  Occupational History   Occupation: disability   Tobacco Use   Smoking status: Former    Types: Cigarettes    Quit date: 08/21/1971    Years since quitting: 51.0    Passive exposure: Never   Smokeless tobacco: Never  Vaping Use   Vaping Use: Never used  Substance and Sexual Activity   Alcohol use: Yes    Alcohol/week: 7.0 standard drinks of alcohol    Types: 7 Glasses of wine per week    Comment: daily,wine   Drug use:  No   Sexual activity: Not on file  Other Topics Concern   Not on file  Social History Narrative   Married, retired Therapist, sports   2 daughters some grandchildren   1 caffeinated drinks daily   1 alcoholic beverage daily   No tobacco      Social Determinants of Health   Financial Resource Strain: Low Risk  (08/17/2022)   Overall Financial Resource Strain (CARDIA)    Difficulty of Paying Living Expenses: Not hard at all  Food Insecurity: No Food Insecurity (08/17/2022)   Hunger Vital Sign    Worried About Running Out of Food in the Last Year: Never true    Ran Out of Food in the Last Year: Never true  Transportation Needs: No Transportation Needs (08/17/2022)   PRAPARE - Hydrologist (Medical): No    Lack of Transportation (Non-Medical): No  Physical Activity: Insufficiently Active (08/17/2022)   Exercise Vital Sign    Days of Exercise per Week: 7 days    Minutes of Exercise per Session: 20 min  Stress: No Stress Concern Present (08/17/2022)   [place]    Feeling of Stress : Not at all  Social Connections: Unknown (08/17/2022)   Social Connection and Isolation Panel [NHANES]    Frequency of Communication with Friends and Family: Not on file    Frequency of Social Gatherings with Friends and Family: Not on file    Attends Religious Services: Not on file    Active Member of Clubs or Organizations: Not on file    Attends Archivist Meetings: Not on file    Marital Status: Married    Tobacco  Counseling Counseling given: Not Answered   Clinical Intake:  Pre-visit preparation completed: Yes        Diabetes: No  How often do you need to have someone help you when you read instructions, pamphlets, or other written materials from your doctor or pharmacy?: 1 - Never    Interpreter Needed?: No      Activities of Daily Living    08/17/2022   11:04 AM  In your present state of health, do you have any difficulty performing the following activities:  Hearing? 0  Vision? 0  Difficulty concentrating or making decisions? 0  Walking or climbing stairs? 0  Dressing or bathing? 0  Doing errands, shopping? 0  Preparing Food and eating ? N  Using the Toilet? N  In the past six months, have you accidently leaked urine? Y  Comment PT in progress. Managed with daily pad.  Do you have problems with loss of bowel control? Y  Comment PT in progress.  Managing your Medications? N  Managing your Finances? N  Housekeeping or managing your Housekeeping? N    Patient Care Team: Crecencio Mc, MD as PCP - General (Internal Medicine)  Indicate any recent Medical Services you may have received from other than [facility] providers in the past year (date may be approximate).     Assessment:   This is a routine wellness examination for Khaliah.  I connected with  Grae D Porreca on 78/29/56 by a audio enabled telemedicine application and verified that I am speaking with the correct person using two identifiers.  Patient Location: Home  Provider Location: Office/Clinic  I discussed the limitations of evaluation and management by telemedicine. The patient expressed understanding and agreed to proceed.   Hearing/Vision screen Hearing Screening - Comments:: Some difficulty  hearing conversational tones.  Followed by [facility].   Vision Screening - Comments:: Followed by [facility]  Wears corrective lenses  Cataract extraction, bilateral    Dietary issues and  exercise activities discussed: Current Exercise Habits: Home exercise routine, Time (Minutes): 20, Frequency (Times/Week): 7, Weekly Exercise (Minutes/Week): 140, Intensity: Mild Regular diet; monitors sugar intake. Good water intake   Goals Addressed             This Visit's Progress    Follow up with provider as needed   On track    Increase physical activity with yoga       Depression Screen    08/17/2022   11:09 AM 08/09/2022    2:17 PM 05/16/2022    2:29 PM 01/31/2022    1:15 PM 11/09/2021   11:42 AM 08/17/2021   10:44 AM 08/17/2021   10:41 AM  PHQ 2/9 Scores  PHQ - 2 Score 0 0 0 0 '5 1 1  '$ PHQ- 9 Score   0 0 16 6     Fall Risk    08/17/2022   11:55 AM 08/09/2022    2:17 PM 05/16/2022    2:29 PM 01/31/2022    1:15 PM 11/09/2021   10:52 AM  Fall Risk   Falls in the past year? 0 0 0 0 0  Number falls in past yr: 0 0  0   Injury with Fall? 0 0     Risk for fall due to : No Fall Risks No Fall Risks No Fall Risks No Fall Risks No Fall Risks  Follow up Falls evaluation completed Falls evaluation completed Falls evaluation completed Falls evaluation completed Falls evaluation completed    [place]: Home free of loose throw rugs in walkways, pet beds, electrical cords, etc? Yes  Adequate lighting in your home to reduce risk of falls? Yes   ASSISTIVE DEVICES UTILIZED TO PREVENT FALLS: Life alert? No  Use of a cane, walker or w/c? No   TIMED UP AND GO: Was the test performed? No .   Cognitive Function:    07/10/2018    1:20 PM  MMSE - Mini Mental State Exam  Orientation to time 5  Orientation to Place 5  Registration 3  Attention/ Calculation 5  Recall 3  Language- name 2 objects 2  Language- repeat 1  Language- follow 3 step command 3  Language- read & follow direction 1  Write a sentence 1  Copy design 1  Total score 30        08/17/2022   11:55 AM 07/16/2019   10:20 AM  6CIT Screen  What Year? 0 points 0 points  What  month? 0 points 0 points  What time? 0 points 0 points  Count back from 20 0 points 0 points  Months in reverse 0 points 0 points  Repeat phrase 0 points 0 points  Total Score 0 points 0 points    Immunizations Immunization History  Administered Date(s) Administered   Fluad Quad(high Dose 65+) 08/09/2020   Influenza Split 08/21/2011, 08/09/2012   Influenza, High Dose Seasonal PF 07/10/2018, 06/09/2019, 07/05/2021   Influenza-Unspecified 07/10/2016, 09/14/2017, 07/10/2022   PFIZER(Purple Top)SARS-COV-2 Vaccination 11/01/2019, 11/23/2019, 07/10/2020, 08/11/2022   PNEUMOCOCCAL CONJUGATE-20 06/25/2021   Pfizer Covid-19 Vaccine Bivalent Booster 51yr & up 08/22/2021   Pneumococcal Conjugate-13 07/17/2014   Pneumococcal Polysaccharide-23 08/21/2011, 07/27/2016   Respiratory Syncytial Virus Vaccine,Recomb Aduvanted(Arexvy) 07/10/2022   Tdap 08/09/2012   Zoster Recombinat (Shingrix)  06/09/2019, 10/15/2019   Zoster, Live 09/09/2016   Screening Tests Health Maintenance  Topic Date Due   DEXA SCAN  08/16/2019   TETANUS/TDAP  08/10/2023 (Originally 08/09/2022)   COVID-19 Vaccine (6 - Pfizer risk series) 10/06/2022   MAMMOGRAM  10/26/2022   Medicare Annual Wellness (AWV)  08/18/2023   COLONOSCOPY (Pts 45-62yr Insurance coverage will need to be confirmed)  05/25/2029   Pneumonia Vaccine 69 Years old  Completed   INFLUENZA VACCINE  Completed   Hepatitis C Screening  Completed   Zoster Vaccines- Shingrix  Completed   HPV VACCINES  Aged Out   Health Maintenance Health Maintenance Due  Topic Date Due   DEXA SCAN  08/16/2019    Lung Cancer Screening: (Low Dose CT Chest recommended if Age 68-80years, 30 pack-year currently smoking OR have quit w/in 15years.) does not qualify.   Hepatitis C Screening: Completed 2017.  Vision Screening: Recommended annual ophthalmology exams for early detection of glaucoma and other disorders of the eye.  Dental Screening: Recommended annual dental  exams for proper oral hygiene.  Community Resource Referral / Chronic Care Management: CRR required this visit?  No   CCM required this visit?  No      Plan:     I have personally reviewed and noted the following in the patient's chart:   Medical and social history Use of alcohol, tobacco or illicit drugs  Current medications and supplements including opioid prescriptions. Patient is currently taking opioid prescriptions. Information provided to patient regarding non-opioid alternatives. Patient advised to discuss non-opioid treatment plan with their provider. Functional ability and status Nutritional status Physical activity Advanced directives List of other physicians Hospitalizations, surgeries, and ER visits in previous 12 months Vitals Screenings to include cognitive, depression, and falls Referrals and appointments  In addition, I have reviewed and discussed with patient certain preventive protocols, quality metrics, and best practice recommendations. A written personalized care plan for preventive services as well as general preventive health recommendations were provided to patient.     DBarton [place] LPN   [id number]    I have reviewed the above information and agree with above.   TDeborra Medina MD

## 2022-08-17 NOTE — Patient Instructions (Signed)
   kitchen counter stretches  Hands on kitchen counter,   Palms shoulder width apart  Minisquat postion Trunk is parallel to floor  A) Pull buttocks back to lengthen spine, knees bent  3 breaths   B) Bring R hand to the L, and stretch the R side trunk  3 breaths   Brings hands to center again Do the same to the L side stretch by placing L hand on top of R   D) Modified thread the needle R hand on L thigh, L  thigh pushing out slightly as the R hands pull in,  elbow bent and pulls to theR,  Look under L armpit   Do the same to other side

## 2022-08-17 NOTE — Patient Instructions (Addendum)
Madeline Young , Thank you for taking time to come for your Medicare Wellness Visit. I appreciate your ongoing commitment to your health goals. Please review the following plan we discussed and let me know if I can assist you in the future.   These are the goals we discussed:  Goals      Follow up with provider as needed     Increase physical activity with yoga     Healthy diet        This is a list of the screening recommended for you and due dates:  Health Maintenance  Topic Date Due   DEXA scan (bone density measurement)  08/16/2019   Tetanus Vaccine  08/10/2023*   COVID-19 Vaccine (6 - Pfizer risk series) 10/06/2022   Mammogram  10/26/2022   Medicare Annual Wellness Visit  08/18/2023   Colon Cancer Screening  05/25/2029   Pneumonia Vaccine  Completed   Flu Shot  Completed   Hepatitis C Screening: USPSTF Recommendation to screen - Ages 18-79 yo.  Completed   Zoster (Shingles) Vaccine  Completed   HPV Vaccine  Aged Out  *Topic was postponed. The date shown is not the original due date.    Advanced directives: on file  Conditions/risks identified: none new  Next appointment: Follow up in one year for your annual wellness visit    Preventive Care 65 Years and Older, Female Preventive care refers to lifestyle choices and visits with your health care provider that can promote health and wellness. What does preventive care include? A yearly physical exam. This is also called an annual well check. Dental exams once or twice a year. Routine eye exams. Ask your health care provider how often you should have your eyes checked. Personal lifestyle choices, including: Daily care of your teeth and gums. Regular physical activity. Eating a healthy diet. Avoiding tobacco and drug use. Limiting alcohol use. Practicing safe sex. Taking low-dose aspirin every day. Taking vitamin and mineral supplements as recommended by your health care provider. What happens during an annual well  check? The services and screenings done by your health care provider during your annual well check will depend on your age, overall health, lifestyle risk factors, and family history of disease. Counseling  Your health care provider may ask you questions about your: Alcohol use. Tobacco use. Drug use. Emotional well-being. Home and relationship well-being. Sexual activity. Eating habits. History of falls. Memory and ability to understand (cognition). Work and work Statistician. Reproductive health. Screening  You may have the following tests or measurements: Height, weight, and BMI. Blood pressure. Lipid and cholesterol levels. These may be checked every 5 years, or more frequently if you are over 25 years old. Skin check. Lung cancer screening. You may have this screening every year starting at age 33 if you have a 30-pack-year history of smoking and currently smoke or have quit within the past 15 years. Fecal occult blood test (FOBT) of the stool. You may have this test every year starting at age 38. Flexible sigmoidoscopy or colonoscopy. You may have a sigmoidoscopy every 5 years or a colonoscopy every 10 years starting at age 86. Hepatitis C blood test. Hepatitis B blood test. Sexually transmitted disease (STD) testing. Diabetes screening. This is done by checking your blood sugar (glucose) after you have not eaten for a while (fasting). You may have this done every 1-3 years. Bone density scan. This is done to screen for osteoporosis. You may have this done starting at age 62. Mammogram.  This may be done every 1-2 years. Talk to your health care provider about how often you should have regular mammograms. Talk with your health care provider about your test results, treatment options, and if necessary, the need for more tests. Vaccines  Your health care provider may recommend certain vaccines, such as: Influenza vaccine. This is recommended every year. Tetanus, diphtheria, and  acellular pertussis (Tdap, Td) vaccine. You may need a Td booster every 10 years. Zoster vaccine. You may need this after age 82. Pneumococcal 13-valent conjugate (PCV13) vaccine. One dose is recommended after age 3. Pneumococcal polysaccharide (PPSV23) vaccine. One dose is recommended after age 22. Talk to your health care provider about which screenings and vaccines you need and how often you need them. This information is not intended to replace advice given to you by your health care provider. Make sure you discuss any questions you have with your health care provider. Document Released: 10/22/2015 Document Revised: 06/14/2016 Document Reviewed: 07/27/2015 Elsevier Interactive Patient Education  2017 [place] Prevention in the Home Falls can cause injuries. They can happen to people of all ages. There are many things you can do to make your home safe and to help prevent falls. What can I do on the outside of my home? Regularly fix the edges of walkways and driveways and fix any cracks. Remove anything that might make you trip as you walk through a door, such as a raised step or threshold. Trim any bushes or trees on the path to your home. Use bright outdoor lighting. Clear any walking paths of anything that might make someone trip, such as rocks or tools. Regularly check to see if handrails are loose or broken. Make sure that both sides of any steps have handrails. Any raised decks and porches should have guardrails on the edges. Have any leaves, snow, or ice cleared regularly. Use sand or salt on walking paths during winter. Clean up any spills in your garage right away. This includes oil or grease spills. What can I do in the bathroom? Use night lights. Install grab bars by the toilet and in the tub and shower. Do not use towel bars as grab bars. Use non-skid mats or decals in the tub or shower. If you need to sit down in the shower, use a plastic, non-slip stool. Keep  the floor dry. Clean up any water that spills on the floor as soon as it happens. Remove soap buildup in the tub or shower regularly. Attach bath mats securely with double-sided non-slip rug tape. Do not have throw rugs and other things on the floor that can make you trip. What can I do in the bedroom? Use night lights. Make sure that you have a light by your bed that is easy to reach. Do not use any sheets or blankets that are too big for your bed. They should not hang down onto the floor. Have a firm chair that has side arms. You can use this for support while you get dressed. Do not have throw rugs and other things on the floor that can make you trip. What can I do in the kitchen? Clean up any spills right away. Avoid walking on wet floors. Keep items that you use a lot in easy-to-reach places. If you need to reach something above you, use a strong step stool that has a grab bar. Keep electrical cords out of the way. Do not use floor polish or wax that makes floors slippery. If you  must use wax, use non-skid floor wax. Do not have throw rugs and other things on the floor that can make you trip. What can I do with my stairs? Do not leave any items on the stairs. Make sure that there are handrails on both sides of the stairs and use them. Fix handrails that are broken or loose. Make sure that handrails are as long as the stairways. Check any carpeting to make sure that it is firmly attached to the stairs. Fix any carpet that is loose or worn. Avoid having throw rugs at the top or bottom of the stairs. If you do have throw rugs, attach them to the floor with carpet tape. Make sure that you have a light switch at the top of the stairs and the bottom of the stairs. If you do not have them, ask someone to add them for you. What else can I do to help prevent falls? Wear shoes that: Do not have high heels. Have rubber bottoms. Are comfortable and fit you well. Are closed at the toe. Do not  wear sandals. If you use a stepladder: Make sure that it is fully opened. Do not climb a closed stepladder. Make sure that both sides of the stepladder are locked into place. Ask someone to hold it for you, if possible. Clearly mark and make sure that you can see: Any grab bars or handrails. First and last steps. Where the edge of each step is. Use tools that help you move around (mobility aids) if they are needed. These include: Canes. Walkers. Scooters. Crutches. Turn on the lights when you go into a dark area. Replace any light bulbs as soon as they burn out. Set up your furniture so you have a clear path. Avoid moving your furniture around. If any of your floors are uneven, fix them. If there are any pets around you, be aware of where they are. Review your medicines with your doctor. Some medicines can make you feel dizzy. This can increase your chance of falling. Ask your doctor what other things that you can do to help prevent falls. This information is not intended to replace advice given to you by your health care provider. Make sure you discuss any questions you have with your health care provider. Document Released: 07/22/2009 Document Revised: 03/02/2016 Document Reviewed: 10/30/2014 Elsevier Interactive Patient Education  2017 [place]. Opioid Pain Medicine Management Opioids are powerful medicines that are used to treat moderate to severe pain. When used for short periods of time, they can help you to: Sleep better. Do better in physical or occupational therapy. Feel better in the first few days after an injury. Recover from surgery. Opioids should be taken with the supervision of a trained health care provider. They should be taken for the shortest period of time possible. This is because opioids can be addictive, and the longer you take opioids, the greater your risk of addiction. This addiction can also be called opioid use disorder. What are the risks? Using opioid  pain medicines for longer than 3 days increases your risk of side effects. Side effects include: Constipation. Nausea and vomiting. Breathing difficulties (respiratory depression). Drowsiness. Confusion. Opioid use disorder. Itching. Taking opioid pain medicine for a long period of time can affect your ability to do daily tasks. It also puts you at risk for: Motor vehicle crashes. Depression. Suicide. Heart attack. Overdose, which can be life-threatening. What is a pain treatment plan? A pain treatment plan is an agreement between you  and your health care provider. Pain is unique to each person, and treatments vary depending on your condition. To manage your pain, you and your health care provider need to work together. To help you do this: Discuss the goals of your treatment, including how much pain you might expect to have and how you will manage the pain. Review the risks and benefits of taking opioid medicines. Remember that a good treatment plan uses more than one approach and minimizes the chance of side effects. Be honest about the amount of medicines you take and about any drug or alcohol use. Get pain medicine prescriptions from only one health care provider. Pain can be managed with many types of alternative treatments. Ask your health care provider to refer you to one or more specialists who can help you manage pain through: Physical or occupational therapy. Counseling (cognitive behavioral therapy). Good nutrition. Biofeedback. Massage. Meditation. Non-opioid medicine. Following a gentle exercise program. How to use opioid pain medicine Taking medicine Take your pain medicine exactly as told by your health care provider. Take it only when you need it. If your pain gets less severe, you may take less than your prescribed dose if your health care provider approves. If you are not having pain, do nottake pain medicine unless your health care provider tells you to take  it. If your pain is severe, do nottry to treat it yourself by taking more pills than instructed on your prescription. Contact your health care provider for help. Write down the times when you take your pain medicine. It is easy to become confused while on pain medicine. Writing the time can help you avoid overdose. Take other over-the-counter or prescription medicines only as told by your health care provider. Keeping yourself and others safe  While you are taking opioid pain medicine: Do not drive, use machinery, or power tools. Do not sign legal documents. Do not drink alcohol. Do not take sleeping pills. Do not supervise children by yourself. Do not do activities that require climbing or being in high places. Do not go to a lake, river, ocean, spa, or swimming pool. Do not share your pain medicine with anyone. Keep pain medicine in a locked cabinet or in a secure area where pets and children cannot reach it. Stopping your use of opioids If you have been taking opioid medicine for more than a few weeks, you may need to slowly decrease (taper) how much you take until you stop completely. Tapering your use of opioids can decrease your risk of symptoms of withdrawal, such as: Pain and cramping in the abdomen. Nausea. Sweating. Sleepiness. Restlessness. Uncontrollable shaking (tremors). Cravings for the medicine. Do not attempt to taper your use of opioids on your own. Talk with your health care provider about how to do this. Your health care provider may prescribe a step-down schedule based on how much medicine you are taking and how long you have been taking it. Getting rid of leftover pills Do not save any leftover pills. Get rid of leftover pills safely by: Taking the medicine to a prescription take-back program. This is usually offered by the county or law enforcement. Bringing them to a pharmacy that has a drug disposal container. Flushing them down the toilet. Check the label or  package insert of your medicine to see whether this is safe to do. Throwing them out in the trash. Check the label or package insert of your medicine to see whether this is safe to do. If it is  safe to throw it out, remove the medicine from the original container, put it into a sealable bag or container, and mix it with used coffee grounds, food scraps, dirt, or cat litter before putting it in the trash. Follow these instructions at home: Activity Do exercises as told by your health care provider. Avoid activities that make your pain worse. Return to your normal activities as told by your health care provider. Ask your health care provider what activities are safe for you. General instructions You may need to take these actions to prevent or treat constipation: Drink enough fluid to keep your urine pale yellow. Take over-the-counter or prescription medicines. Eat foods that are high in fiber, such as beans, whole grains, and fresh fruits and vegetables. Limit foods that are high in fat and processed sugars, such as fried or sweet foods. Keep all follow-up visits. This is important. Where to find support If you have been taking opioids for a long time, you may benefit from receiving support for quitting from a local support group or counselor. Ask your health care provider for a referral to these resources in your area. Where to find more information Centers for Disease Control and Prevention (CDC): http://www.wolf.info/ [place]. Food and Drug Administration (FDA): [place] Get help right away if: You may have taken too much of an opioid (overdosed). Common symptoms of an overdose: Your breathing is slower or more shallow than normal. You have a very slow heartbeat (pulse). You have slurred speech. You have nausea and vomiting. Your pupils become very small. You have other potential symptoms: You are very confused. You faint or feel like you will faint. You have cold, clammy skin. You have blue  lips or fingernails. You have thoughts of harming yourself or harming others. These symptoms may represent a serious problem that is an emergency. Do not wait to see if the symptoms will go away. Get medical help right away. Call your local emergency services (911 in the [place] Do not drive yourself to the hospital.  If you ever feel like you may hurt yourself or others, or have thoughts about taking your own life, get help right away. Go to your nearest emergency department or: Call your local emergency services (911 in the [place] Call the [facility] 418-281-0917 in the [place] Call a suicide crisis helpline, such as the [place] at (959)724-8989 or 988 in the [place]. This is open 24 hours a day in the [place] Text the Crisis Text Line at 779-339-3296 (in the [place].). Summary Opioid medicines can help you manage moderate to severe pain for a short period of time. A pain treatment plan is an agreement between you and your health care provider. Discuss the goals of your treatment, including how much pain you might expect to have and how you will manage the pain. If you think that you or someone else may have taken too much of an opioid, get medical help right away. This information is not intended to replace advice given to you by your health care provider. Make sure you discuss any questions you have with your health care provider. Document Revised: 04/20/2021 Document Reviewed: 01/05/2021 Elsevier Patient Education  [place].

## 2022-08-22 DIAGNOSIS — Z872 Personal history of diseases of the skin and subcutaneous tissue: Secondary | ICD-10-CM | POA: Diagnosis not present

## 2022-08-22 DIAGNOSIS — L408 Other psoriasis: Secondary | ICD-10-CM | POA: Diagnosis not present

## 2022-08-22 DIAGNOSIS — Z09 Encounter for follow-up examination after completed treatment for conditions other than malignant neoplasm: Secondary | ICD-10-CM | POA: Diagnosis not present

## 2022-08-24 ENCOUNTER — Ambulatory Visit: Payer: Medicare HMO | Admitting: Physical Therapy

## 2022-08-28 ENCOUNTER — Telehealth: Payer: Self-pay | Admitting: Psychiatry

## 2022-08-28 NOTE — Telephone Encounter (Signed)
Next visit is 11/16/22. Requesting refill on Vyvanse 70 mg called to:  [facility] [place] [name], [place]   Phone: [phone]  Fax: [phone]    Its in stock

## 2022-08-29 ENCOUNTER — Other Ambulatory Visit: Payer: Self-pay

## 2022-08-29 DIAGNOSIS — F900 Attention-deficit hyperactivity disorder, predominantly inattentive type: Secondary | ICD-10-CM

## 2022-08-29 DIAGNOSIS — Z872 Personal history of diseases of the skin and subcutaneous tissue: Secondary | ICD-10-CM | POA: Diagnosis not present

## 2022-08-29 DIAGNOSIS — M1120 Other chondrocalcinosis, unspecified site: Secondary | ICD-10-CM | POA: Diagnosis not present

## 2022-08-29 MED ORDER — LISDEXAMFETAMINE DIMESYLATE 70 MG PO CAPS: 70.0000 mg | ORAL_CAPSULE | Freq: Every day | ORAL | 0 refills | Status: DC

## 2022-08-29 NOTE — Telephone Encounter (Signed)
Pended.

## 2022-09-07 ENCOUNTER — Ambulatory Visit: Payer: Medicare HMO | Admitting: Physical Therapy

## 2022-09-07 DIAGNOSIS — R278 Other lack of coordination: Secondary | ICD-10-CM

## 2022-09-07 DIAGNOSIS — M5459 Other low back pain: Secondary | ICD-10-CM

## 2022-09-07 DIAGNOSIS — M533 Sacrococcygeal disorders, not elsewhere classified: Secondary | ICD-10-CM

## 2022-09-07 DIAGNOSIS — R2689 Other abnormalities of gait and mobility: Secondary | ICD-10-CM | POA: Diagnosis not present

## 2022-09-07 NOTE — Therapy (Signed)
OUTPATIENT PHYSICAL THERAPY TREATMENT   Patient Name: Madeline Young MRN: [id number] DOB:07-08-1956, 66 y.o., female Today's Date: 09/07/2022   PT End of Session - 09/07/22 1506     Visit Number 7    Number of Visits 10    Date for PT Re-Evaluation 09/07/22    PT Start Time 4098    PT Stop Time 1191    PT Time Calculation (min) 42 min    Activity Tolerance Patient tolerated treatment well    Behavior During Therapy [facility] for tasks assessed/performed             Past Medical History:  Diagnosis Date   Adenoma 10/06/2008   sigmoid 66m   ADHD (attention deficit hyperactivity disorder)    Allergy    Anxiety    Arthritis    Cataract    bil cateracts removed   Complication of anesthesia    first colonoscopy pt states she woke up   Depression    GERD (gastroesophageal reflux disease)    Globus sensation    Hyperlipidemia    Incontinence    bowels at times   Insomnia    Multinodular goiter (nontoxic)    [place]oriatic arthritis [place]    Spinal stenosis of lumbar region 03/10/2015   MRI    Statin intolerance 01/27/2013   Past Surgical History:  Procedure Laterality Date   BACK SURGERY  02/17/2016   Dr. JJacqulynn Cadet L4-L5 fusion    BIOPSY THYROID  2014   COLONOSCOPY     COLONOSCOPY W/ POLYPECTOMY     EYE SURGERY     bilateral cataract surgery w/ lens implant   FOOT SURGERY  2006   Right foot , secondary to severe loss of joint ([facility]   POLYPECTOMY     TONSILLECTOMY     UPPER GASTROINTESTINAL ENDOSCOPY  2005   With empiric esophageal dilation   Patient Active Problem List   Diagnosis Date Noted   Urge incontinence 05/16/2022   Onychomycosis 08/17/2021   Drug allergy 06/29/2021   Fibromyalgia 06/29/2021   Generalized osteoarthritis 06/29/2021   Hand joint pain 06/29/2021   Other intervertebral disc degeneration, lumbar region 06/29/2021   Other long term (current) drug therapy 06/29/2021   Fecal smearing 08/10/2020   Sciatica 01/28/2020   Rhinitis,  non-allergic 11/10/2019   Insomnia 08/10/2019   Anxiety 10/11/2018   Carotid artery stenosis, asymptomatic, bilateral 10/29/2016   ADD (attention deficit disorder) 10/29/2016   Spondylolisthesis of lumbar region 02/17/2016   Globus sensation since at least 2005 04/29/2015   GERD (gastroesophageal reflux disease) 11/17/2014   Statin intolerance 01/27/2013   Travel advice encounter 01/27/2013   Multinodular goiter (nontoxic) 08/11/2012   Overweight (BMI 25.0-29.9) 08/11/2012   Psoriatic arthritis, destructive type ([place]    Screening for cervical cancer 08/21/2011   Screening for breast cancer 08/21/2011   Hx of adenomatous polyp of colon 10/06/2008   Familial hyperlipidemia 09/15/2008   Major depressive disorder, single episode, in remission ([place] 09/15/2008   ALLERGIC RHINITIS 09/15/2008    PCP: TDerrel Nip  REFERRING PROVIDER: GLucretia RoersDIAG: R32,R15.9 (ICD-10-CM) - Urinary and fecal incontinence  Rationale for Evaluation and Treatment Rehabilitation  THERAPY DIAG:  Other lack of coordination  Other abnormalities of gait and mobility  Sacrococcygeal disorders, not elsewhere classified  Other low back pain  ONSET DATE:   SUBJECTIVE:  SUBJECTIVE STATEMENT  TODAY :            Pt reported her LBP was better after last session. Today, pt noticed L LBP at the iliac crest. Pt has not been able to keep up her HEP.  Pt has not been able to sleep well . Pt is sleeping on her L side now because she used to sleep on her R but she is not able to sleep on her R side because of this LBP.   Fecal leakage is better and she only had one little incident. The urinary leakage is better too. She started wearing her thin pads again.   SUBJECTIVE STATEMENT on EVAL 07/06/22: 1) CLBP: Lumbar fusion 2017,  currently 4/10, at worst 10/10 when bending over a low stool to weed garden 20-30 min, carrying things too heavy, standing 3-4 hours and walking 1 hour, drive 3 hours or more . Pt reports stiffness has gone away in the morning with her medications. Stress and diet and weather affect her pain. Pt meditates and rest. Pt was doing yoga 2 x week but she stopped going during Pandemic.   Pt did water aerobic on her own and she enjoyed that.  Hx of R foot surgery    2) urinary and fecal incontinence:  fecal incontinence started after lumbar surgery 2017.  Currently pt wears pads during the day for /incontinence 2 x day. Fecal  leakage occurs 2 x week and urinary incontinence occurs daily with urge incontinence.  PERTINENT HISTORY:   Psoriatic arthritis, inflammatory, osteoarthris, prior to LBP surgery  (fusion at L1-2 in 2017) , R foot surgery   PAIN:  Are you having pain? Yes: see above   PRECAUTIONS:   NONE   WEIGHT BEARING RESTRICTIONS : NONE   FALLS:  Has patient fallen in last 6 months? 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   PLOF: Independent  PATIENT GOALS                Have more control     OBJECTIVE:      [facility] PT Assessment - 09/07/22 1512       Coordination   Coordination and Movement Description overuse of chest abd neck mm with deep core coordination      Palpation   Spinal mobility R sidelfexion caused L LBP    SI assessment  levelled pelvis    Palpation comment  T/J junction hypomobility, medial L scapula tightness with hypomobility at cervical spine            [facility] Adult PT Treatment/Exercise - 09/07/22 1633       Neuro Re-ed    Neuro Re-ed Details  cued for deep core level 1-2 , less abdominal/ chest breathing ,      Modalities   Modalities Moist Heat      Moist Heat Therapy   Number  Minutes Moist Heat 5 Minutes    Moist Heat Location --   thoracic during instruciton for deep core 1-2     Manual Therapy   Manual therapy comments STM/MWM at problem areas noted in assessment to promote mobility at T/J junction, medial L scapula with mobility at cervical spine                 HOME EXERCISE PROGRAM: See pt instruction section  ASSESSMENT:  CLINICAL IMPRESSION: Pt required manual Tx today at L thoracic area after which diaphragmatic breathing was better achieved. Progressed pt to deep core coordination but pt required cues for less chest breathing and abdominal straining.    Anticipate deep core strenghtening introduced today will help further address LBP. Urinary and fecal incontinence is gradually improving.  Pt benefits from skilled PT. Plan to assess pelvic floor and add multifidis mm strengthening at next session.     OBJECTIVE IMPAIRMENTS decreased activity tolerance, decreased coordination, decreased endurance, decreased mobility, difficulty walking, decreased ROM, decreased strength, decreased safety awareness, hypomobility, increased muscle spasms, impaired flexibility, improper body mechanics, postural dysfunction, pain,  scar restrictions   ACTIVITY LIMITATIONS  self-care, home chores, work tasks    PARTICIPATION LIMITATIONS:  community, yoga activities      PERSONAL FACTORS  Psoriatic arthritis, inflammatory, osteoarthris, prior to LBP surgery  (fusion at L1-2 in 2017) , R foot surgery      are also affecting patient's functional outcome.    REHAB POTENTIAL: Good   CLINICAL DECISION MAKING: Evolving/moderate complexity   EVALUATION COMPLEXITY: Moderate    PATIENT EDUCATION:    Education details: Showed pt anatomy images. Explained muscles attachments/ connection, physiology of deep core system/ spinal- thoracic-pelvis-lower kinetic chain as they relate to pt's presentation, Sx, and past Hx. Explained what and how these areas of deficits  need to be restored to balance and function    See Therapeutic activity / neuromuscular re-education section  Answered pt's questions.   Person educated: Patient Education method: Explanation, Demonstration, Tactile cues, Verbal cues, and Handouts Education comprehension: verbalized understanding, returned demonstration, verbal cues required, tactile cues required, and needs further education     PLAN: PT FREQUENCY: 1x/week   PT DURATION: 10 weeks   PLANNED INTERVENTIONS: Therapeutic exercises, Therapeutic activity, Neuromuscular re-education, Balance training, Gait training, Patient/Family education, Self Care, Joint mobilization, Spinal mobilization, Moist heat, Taping, and Manual therapy.   PLAN FOR NEXT SESSION: See clinical impression for plan     GOALS: Goals reviewed with patient? Yes  SHORT TERM GOALS: Target date: 07/28/2022    Pt will demo IND with HEP                    Baseline: Not IND            Goal status: INITIAL   LONG TERM GOALS: Target date:  09/07/2022    1.Pt will demo proper deep core coordination without chest breathing and optimal excursion of diaphragm/pelvic floor in order to promote spinal stability and pelvic floor function  Baseline: dyscoordination Goal status: INITIAL  2.  Pt will demo > 5 pt change on FOTO  to improve QOL and function    PFDI Urinary baseline - 75   PFDI Bowel -75  Lumber baseline  - 53 pts  Goal status: INITIAL  3.  Pt will demo proper body mechanics in against gravity tasks and ADLs  work tasks, fitness  to minimize straining pelvic floor / back                  Baseline: not IND, improper form that places strain on pelvic floor                Goal status: INITIAL    4.  Pt will be compliant with multifidis mm and thoracolumbar strengthening HEP to decrease LBP by 50%  with gardening > 20-30 min, standing > 3-4 hours, driving 3 hours  Baseline:  bending over a low stool to weed garden 20-30 min, carrying  things too heavy, standing 3-4 hours and walking 1 hour, drive 3 hours or more    Goal status: INITIAL    5. Pt will decrease pad wear to < 1 pad a day for urine leakage and < 1 x week for fecal leakage in order to improve QOL  Baseline: wears pads during the day for /incontinence 2 x day. Fecal  leakage occurs 2 x week Goal status: INITIAL   6. Pt will be IND with modifications to yoga practice to minimize risk for injuries in order to return yoga community classes  Baseline:  no education on modifications  Goal status: INITIAL     Jerl Mina, PT 09/07/2022, 3:08 PM

## 2022-09-14 ENCOUNTER — Ambulatory Visit: Payer: Medicare HMO | Admitting: Physical Therapy

## 2022-09-14 DIAGNOSIS — M1611 Unilateral primary osteoarthritis, right hip: Secondary | ICD-10-CM | POA: Diagnosis not present

## 2022-09-14 DIAGNOSIS — M76899 Other specified enthesopathies of unspecified lower limb, excluding foot: Secondary | ICD-10-CM | POA: Diagnosis not present

## 2022-09-14 DIAGNOSIS — M160 Bilateral primary osteoarthritis of hip: Secondary | ICD-10-CM | POA: Diagnosis not present

## 2022-09-21 ENCOUNTER — Encounter: Payer: Medicare HMO | Admitting: Physical Therapy

## 2022-09-28 ENCOUNTER — Ambulatory Visit: Payer: Medicare HMO | Admitting: Physical Therapy

## 2022-10-03 ENCOUNTER — Other Ambulatory Visit: Payer: Self-pay | Admitting: Internal Medicine

## 2022-10-05 ENCOUNTER — Encounter: Payer: Medicare HMO | Admitting: Physical Therapy

## 2022-10-11 ENCOUNTER — Telehealth: Payer: Self-pay | Admitting: Internal Medicine

## 2022-10-11 NOTE — Telephone Encounter (Signed)
Pt need a refill on hydrocodone [facility] and pt got summoned for jury duty and need a disability note stating she can not sit that long. Pt would like to be called

## 2022-10-11 NOTE — Telephone Encounter (Signed)
LMTCB. Need to clarify the name of medication she is requesting. Hydrocodone is not in pt's medication list.

## 2022-10-12 ENCOUNTER — Ambulatory Visit: Payer: Medicare HMO | Admitting: Physical Therapy

## 2022-10-12 MED ORDER — OXYCODONE-ACETAMINOPHEN 5-325 MG PO TABS: 1.0000 | ORAL_TABLET | Freq: Every day | ORAL | 0 refills | Status: DC | PRN

## 2022-10-12 NOTE — Telephone Encounter (Signed)
Refilled: 08/09/2022 Last OV: 08/09/2022 Next OV: 11/09/2022

## 2022-10-12 NOTE — Addendum Note (Signed)
Addended by: Adair Laundry on: 10/12/2022 04:44 PM   Modules accepted: Orders

## 2022-10-12 NOTE — Telephone Encounter (Signed)
Pt is requesting oxycodone to be sent [facility]

## 2022-10-18 ENCOUNTER — Telehealth: Payer: Self-pay | Admitting: Internal Medicine

## 2022-10-18 NOTE — Telephone Encounter (Signed)
error 

## 2022-10-18 NOTE — Telephone Encounter (Signed)
Pt came into the office wanting an update on the jury letter

## 2022-10-19 ENCOUNTER — Encounter: Payer: Medicare HMO | Admitting: Physical Therapy

## 2022-10-19 NOTE — Telephone Encounter (Signed)
Spoke with pt and advised her of the message below. Pt gave a verbal understanding.

## 2022-10-26 ENCOUNTER — Encounter: Payer: Medicare HMO | Admitting: Physical Therapy

## 2022-11-07 ENCOUNTER — Other Ambulatory Visit: Payer: Self-pay | Admitting: Internal Medicine

## 2022-11-07 DIAGNOSIS — M25511 Pain in right shoulder: Secondary | ICD-10-CM

## 2022-11-08 ENCOUNTER — Ambulatory Visit
Admission: RE | Admit: 2022-11-08 | Discharge: 2022-11-08 | Disposition: A | Discharge status: Home / Self Care | Payer: Medicare HMO | Source: Ambulatory Visit | Attending: Internal Medicine | Admitting: Internal Medicine

## 2022-11-08 DIAGNOSIS — M25511 Pain in right shoulder: Secondary | ICD-10-CM | POA: Diagnosis not present

## 2022-11-09 ENCOUNTER — Encounter: Payer: Self-pay | Admitting: Internal Medicine

## 2022-11-09 ENCOUNTER — Ambulatory Visit (INDEPENDENT_AMBULATORY_CARE_PROVIDER_SITE_OTHER): Payer: Medicare HMO | Admitting: Internal Medicine

## 2022-11-09 VITALS — BP 120/58 | HR 105 | Temp 98.2°F | Ht 66.0 in | Wt 161.2 lb

## 2022-11-09 DIAGNOSIS — E7849 Other hyperlipidemia: Secondary | ICD-10-CM | POA: Diagnosis not present

## 2022-11-09 DIAGNOSIS — Z1231 Encounter for screening mammogram for malignant neoplasm of breast: Secondary | ICD-10-CM

## 2022-11-09 DIAGNOSIS — M25511 Pain in right shoulder: Secondary | ICD-10-CM | POA: Diagnosis not present

## 2022-11-09 DIAGNOSIS — L4052 Psoriatic arthritis mutilans: Secondary | ICD-10-CM | POA: Diagnosis not present

## 2022-11-09 LAB — COMPREHENSIVE METABOLIC PANEL
ALT: 21 U/L (ref 0–35)
AST: 21 U/L (ref 0–37)
Albumin: 4.3 g/dL (ref 3.5–5.2)
Alkaline Phosphatase: 46 U/L (ref 39–117)
BUN: 25 mg/dL — ABNORMAL HIGH (ref 6–23)
CO2: 30 mEq/L (ref 19–32)
Calcium: 9.6 mg/dL (ref 8.4–10.5)
Chloride: 101 mEq/L (ref 96–112)
Creatinine, Ser: 0.83 mg/dL (ref 0.40–1.20)
GFR: 72.53 mL/min (ref >60.00)
Glucose, Bld: 128 mg/dL — ABNORMAL HIGH (ref 70–99)
Potassium: 4.1 mEq/L (ref 3.5–5.1)
Sodium: 139 mEq/L (ref 135–145)
Total Bilirubin: 0.4 mg/dL (ref 0.2–1.2)
Total Protein: 6.8 g/dL (ref 6.0–8.3)

## 2022-11-09 LAB — LIPID PANEL
Cholesterol: 365 mg/dL — ABNORMAL HIGH (ref 0–200)
HDL: 71.4 mg/dL (ref >39.00)
LDL Cholesterol: 277 mg/dL — ABNORMAL HIGH (ref 0–99)
NonHDL: 293.48
Total CHOL/HDL Ratio: 5
Triglycerides: 81 mg/dL (ref 0.0–149.0)
VLDL: 16.2 mg/dL (ref 0.0–40.0)

## 2022-11-09 LAB — SEDIMENTATION RATE: Sed Rate: 13 mm/hr (ref 0–30)

## 2022-11-09 LAB — LDL CHOLESTEROL, DIRECT: Direct LDL: 256 mg/dL

## 2022-11-09 LAB — C-REACTIVE PROTEIN: CRP: <1 mg/dL (ref 0.5–20.0)

## 2022-11-09 MED ORDER — OXYCODONE-ACETAMINOPHEN 5-325 MG PO TABS: 1.0000 | ORAL_TABLET | Freq: Every day | ORAL | 0 refills | Status: DC | PRN

## 2022-11-09 NOTE — Patient Instructions (Addendum)
Ask your rheumatologist about a [facility] urogynecologist for your urge incontinence .  I will ask my colleague at [facility] as well   Try Salon Pas patch with 4% lidocaine for the shoulder   You might want to try using Relaxium for insomnia  (as seen on TV commercials, available on [place] ) . It contains:  Melatonin 5 mg  Chamomile 25 mg Passionflower extract 75 mg GABA 100 mg Ashwaganda extract 125 mg Magnesium citrate, glycinate, oxide (100 mg)  L tryptophan 500 mg Valerest (proprietary  ingredient ; probably valeria root extract)

## 2022-11-09 NOTE — Progress Notes (Signed)
Subjective:  Patient ID: Madeline Young, female    DOB: 02-21-1956  Age: 67 y.o. MRN: [id number]  CC: The primary encounter diagnosis was Encounter for screening mammogram for malignant neoplasm of breast. Diagnoses of Familial hyperlipidemia, Pain in joint of right shoulder, and Psoriatic arthritis, destructive type ([place]) were also pertinent to this visit.   HPI Madeline Young presents for  Chief Complaint  Patient presents with   Medical Management of Chronic Issues    3 month follow up    1) Chronic joint pain secondary to psoriatic arthritis.  Pain managed with stable doses of oxycodone Refill history confirmed via [place] Controlled Substance databas, accessed by me today.. last refill Oct 12 2022/  Rheum wanted to switch med to [facility] but [facility] cost is  $3000/month   Winter causing more joint pain.  Right hip injection in December relief was delayer,  but now with right shoulder pain that radiates to her hand.  Scheduled  for a joint injection next week by rheumatology .  Had reduced oxycodone to once daily  but now using 2 daily   tips of  finger splitting  and painful due to bursitis   Recently recovered from Productive cough of 2 months duration.  Was not evaluated or treated. No fevers,  had some rhinits.  Outpatient Medications Prior to Visit  Medication Sig Dispense Refill   aspirin 81 MG tablet Take 81 mg by mouth daily.     calcium carbonate (OS-CAL - DOSED IN MG OF ELEMENTAL CALCIUM) 1250 (500 Ca) MG tablet      celecoxib (CELEBREX) 200 MG capsule Take 1 capsule by mouth once daily 90 capsule 0   cholecalciferol (VITAMIN D) 1000 UNITS tablet Take 1,000 Units by mouth daily.     clobetasol (TEMOVATE) 0.05 % external solution as needed.     diclofenac Sodium (VOLTAREN) 1 % GEL APPLY 2 GRAMS TO AFFECTED AREA 4 TIMES A DAY 300 g 3   fluocinonide ointment (LIDEX) 0.05 % APPLY TWICE DAILY TO AFFECTED AREAS UNTIL IMPROVED THEN AS NEEDED FOR FLARES     FLUoxetine (PROZAC) 20 MG  capsule TAKE 1 CAPSULE BY MOUTH EVERY DAY 90 capsule 1   hydroxychloroquine (PLAQUENIL) 200 MG tablet Take 400 mg by mouth daily.     ipratropium (ATROVENT) 0.03 % nasal spray Place into both nostrils.     leflunomide (ARAVA) 20 MG tablet Take 20 mg by mouth daily.     lisdexamfetamine (VYVANSE) 70 MG capsule Take 1 capsule (70 mg total) by mouth daily. 30 capsule 0   lisdexamfetamine (VYVANSE) 70 MG capsule Take 1 capsule (70 mg total) by mouth daily. 30 capsule 0   lisdexamfetamine (VYVANSE) 70 MG capsule Take 1 capsule (70 mg total) by mouth daily. 30 capsule 0   magnesium 30 MG tablet Take 30 mg by mouth daily.     melatonin 5 MG TABS Take 5 mg by mouth at bedtime.     Multiple Vitamin (MULTIVITAMIN) tablet Take 1 tablet by mouth daily.     nystatin-triamcinolone ointment (MYCOLOG) Apply 1 application topically 2 (two) times daily. 30 g 1   Omega-3 Fatty Acids (FISH OIL) 1200 MG CAPS Take 1,200 mg by mouth daily.      Polyethyl Glycol-Propyl Glycol 0.4-0.3 % SOLN Place 1 drop into both eyes daily as needed (for dry eyes).     predniSONE (DELTASONE) 10 MG tablet Take 10 mg by mouth 2 (two) times daily. Taking '15mg'$  currently due to flare  Probiotic Product (PROBIOTIC DAILY PO) Take 1 capsule by mouth daily.     triamcinolone cream (KENALOG) 0.1 % APPLY TO AFFECTED AREA TWICE A DAY UNTIL CLEAR THEN AS NEEDED     trospium (SANCTURA) 20 MG tablet TAKE 1 TABLET BY MOUTH TWICE A DAY 180 tablet 1   Turmeric 500 MG TABS Take 1 tablet by mouth daily.     MAGNESIUM-ZINC PO Take 2 tablets by mouth daily.     Melatonin-Pyridoxine (MELATIN PO) Take 5 mg by mouth at bedtime.     oxyCODONE-acetaminophen (PERCOCET/ROXICET) 5-325 MG tablet Take 1-2 tablets by mouth daily as needed for moderate pain. Do not refill less than 30 days from prior refill 60 tablet 0   lisdexamfetamine (VYVANSE) 60 MG capsule Take 1 capsule (60 mg total) by mouth every morning. (Patient not taking: Reported on 11/09/2022) 30  capsule 0   No facility-administered medications prior to visit.    Review of Systems;  Patient denies headache, fevers, malaise, unintentional weight loss, skin rash, eye pain, sinus congestion and sinus pain, sore throat, dysphagia,  hemoptysis , cough, dyspnea, wheezing, chest pain, palpitations, orthopnea, edema, abdominal pain, nausea, melena, diarrhea, constipation, flank pain, dysuria, hematuria, urinary  Frequency, nocturia, numbness, tingling, seizures,  Focal weakness, Loss of consciousness,  Tremor, insomnia, depression, anxiety, and suicidal ideation.      Objective:  BP (!) 120/58   Pulse (!) 105   Temp 98.2 F (36.8 C) (Oral)   Ht '5\' 6"'$  (1.676 m)   Wt 161 lb 3.2 oz (73.1 kg)   SpO2 93%   BMI 26.02 kg/m   BP Readings from Last 3 Encounters:  11/09/22 (!) 120/58  08/09/22 134/86  05/25/22 (!) 140/74    Wt Readings from Last 3 Encounters:  11/09/22 161 lb 3.2 oz (73.1 kg)  08/17/22 164 lb (74.4 kg)  08/09/22 164 lb 6.4 oz (74.6 kg)    Physical Exam Vitals reviewed.  Constitutional:      General: She is not in acute distress.    Appearance: Normal appearance. She is normal weight. She is not ill-appearing, toxic-appearing or diaphoretic.  HENT:     Head: Normocephalic.  Eyes:     General: No scleral icterus.       Right eye: No discharge.        Left eye: No discharge.     Conjunctiva/sclera: Conjunctivae normal.  Cardiovascular:     Rate and Rhythm: Normal rate and regular rhythm.     Heart sounds: Normal heart sounds.  Pulmonary:     Effort: Pulmonary effort is normal. No respiratory distress.     Breath sounds: Normal breath sounds.  Musculoskeletal:        General: Normal range of motion.  Skin:    General: Skin is warm and dry.  Neurological:     General: No focal deficit present.     Mental Status: She is alert and oriented to person, place, and time. Mental status is at baseline.  Psychiatric:        Mood and Affect: Mood normal.         Behavior: Behavior normal.        Thought Content: Thought content normal.        Judgment: Judgment normal.     Lab Results  Component Value Date   HGBA1C 6.1 01/31/2022    Lab Results  Component Value Date   CREATININE 0.83 11/09/2022   CREATININE 0.72 11/09/2021   CREATININE 0.72 02/09/2021  Lab Results  Component Value Date   WBC 5.4 01/31/2022   HGB 12.3 01/31/2022   HCT 36.7 01/31/2022   PLT 233.0 01/31/2022   GLUCOSE 128 (H) 11/09/2022   CHOL 365 (H) 11/09/2022   TRIG 81.0 11/09/2022   HDL 71.40 11/09/2022   LDLDIRECT 256.0 11/09/2022   LDLCALC 277 (H) 11/09/2022   ALT 21 11/09/2022   AST 21 11/09/2022   NA 139 11/09/2022   K 4.1 11/09/2022   CL 101 11/09/2022   CREATININE 0.83 11/09/2022   BUN 25 (H) 11/09/2022   CO2 30 11/09/2022   TSH 0.96 01/31/2022   HGBA1C 6.1 01/31/2022   MICROALBUR 1.7 01/31/2022    DG Shoulder Right  Result Date: 11/09/2022 CLINICAL DATA:  Acute right shoulder pain for 1 and half months. No known injury. EXAM: RIGHT SHOULDER - 2+ VIEW COMPARISON:  None Available. FINDINGS: No acute fracture or dislocation. Mild degenerative arthritis glenohumeral and AC joints. IMPRESSION: No acute fracture or dislocation. Electronically Signed   By: Placido Sou M.D.   On: 11/09/2022 00:02    Assessment & Plan:  .Encounter for screening mammogram for malignant neoplasm of breast -     3D Screening Mammogram, Left and Right; Future  Familial hyperlipidemia -     Lipid panel -     LDL cholesterol, direct -     Comprehensive metabolic panel  Pain in joint of right shoulder -     Sedimentation rate -     C-reactive protein  Psoriatic arthritis, destructive type (HCC) Assessment & Plan: Managed with plaquenil and AravA,  With prednisone tapers for flares. Her pain is managed with oxycodone,  Taken not more than twice daily.  Prescription updated and sent to pharmacy . Refill history confirmed via [place] Controlled Substance databas, accessed  by me today..Managed with plaquenil and AravA,  With prednisone tapers for flares. Her pain is managed with oxycodone,  Taken not more than twice daily.  Prescription updated and sent to pharmacy . Refill history confirmed via [place] Controlled Substance databas, accessed by me today..   Other orders -     oxyCODONE-Acetaminophen; Take 1-2 tablets by mouth daily as needed for moderate pain. Do not refill less than 30 days from prior refill  Dispense: 60 tablet; Refill: 0     I provided 30 minutes of face-to-face time during this encounter reviewing patient's last visit with me, patient's  most recent visit with Rheumatology,  recent surgical and non surgical procedures, previous  labs and imaging studies, counseling on currently addressed issues,  and post visit ordering to diagnostics and therapeutics .   Follow-up: Return in about 3 months (around 02/07/2023) for chronic pain management.   Crecencio Mc, MD

## 2022-11-10 DIAGNOSIS — H524 Presbyopia: Secondary | ICD-10-CM | POA: Diagnosis not present

## 2022-11-11 NOTE — Assessment & Plan Note (Signed)
Managed with plaquenil and AravA,  With prednisone tapers for flares. Her pain is managed with oxycodone,  Taken not more than twice daily.  Prescription updated and sent to pharmacy . Refill history confirmed via [place] Controlled Substance databas, accessed by me today..Managed with plaquenil and AravA,  With prednisone tapers for flares. Her pain is managed with oxycodone,  Taken not more than twice daily.  Prescription updated and sent to pharmacy . Refill history confirmed via [place] Controlled Substance databas, accessed by me today.Marland Kitchen

## 2022-11-13 DIAGNOSIS — F988 Other specified behavioral and emotional disorders with onset usually occurring in childhood and adolescence: Secondary | ICD-10-CM | POA: Diagnosis not present

## 2022-11-13 DIAGNOSIS — Z79899 Other long term (current) drug therapy: Secondary | ICD-10-CM | POA: Diagnosis not present

## 2022-11-13 DIAGNOSIS — M7551 Bursitis of right shoulder: Secondary | ICD-10-CM | POA: Diagnosis not present

## 2022-11-13 DIAGNOSIS — I10 Essential (primary) hypertension: Secondary | ICD-10-CM | POA: Diagnosis not present

## 2022-11-13 DIAGNOSIS — Z7982 Long term (current) use of aspirin: Secondary | ICD-10-CM | POA: Diagnosis not present

## 2022-11-13 DIAGNOSIS — R69 Illness, unspecified: Secondary | ICD-10-CM | POA: Diagnosis not present

## 2022-11-13 DIAGNOSIS — L4050 Arthropathic psoriasis, unspecified: Secondary | ICD-10-CM | POA: Diagnosis not present

## 2022-11-13 DIAGNOSIS — F32A Depression, unspecified: Secondary | ICD-10-CM | POA: Diagnosis not present

## 2022-11-16 ENCOUNTER — Encounter: Payer: Self-pay | Admitting: Psychiatry

## 2022-11-16 ENCOUNTER — Ambulatory Visit (INDEPENDENT_AMBULATORY_CARE_PROVIDER_SITE_OTHER): Payer: Medicare HMO | Admitting: Psychiatry

## 2022-11-16 VITALS — BP 148/87 | HR 103

## 2022-11-16 DIAGNOSIS — F5105 Insomnia due to other mental disorder: Secondary | ICD-10-CM

## 2022-11-16 DIAGNOSIS — R69 Illness, unspecified: Secondary | ICD-10-CM | POA: Diagnosis not present

## 2022-11-16 DIAGNOSIS — F3342 Major depressive disorder, recurrent, in full remission: Secondary | ICD-10-CM | POA: Diagnosis not present

## 2022-11-16 DIAGNOSIS — F900 Attention-deficit hyperactivity disorder, predominantly inattentive type: Secondary | ICD-10-CM | POA: Diagnosis not present

## 2022-11-16 MED ORDER — LISDEXAMFETAMINE DIMESYLATE 70 MG PO CAPS: 70.0000 mg | ORAL_CAPSULE | Freq: Every day | ORAL | 0 refills | Status: DC

## 2022-11-16 NOTE — Progress Notes (Signed)
Madeline Young [id number] 05-28-1956 67 y.o.   Subjective:   Patient ID:  Madeline Young is a 67 y.o. (DOB 03/02/1956) female.  Chief Complaint:  Chief Complaint  Patient presents with   Follow-up   ADHD   Depression    Depression        Associated symptoms include decreased concentration.  Associated symptoms include no suicidal ideas.  Madeline Young presents to the office today for follow-up of ADD and anxiety and depression and sleep.  seen August 2020.  No meds were changed.  On Vyvanse 50, fluoxetine 20, and Ambien 5.  seen November 30, 2019.  The following was noted: "Down".  Doesn't feel Vyvanse helping as much.  More scattered and doesn't finish things.  No SE with it. Sleep pattern is worse as Covid progressed.  Had reduced Zolpidem to 5 mg but then increased to 7.5 mg HS.  If takes Benadryl but can sleep but then has crying spells after it and hangover.  In the AM feels more negative and tearful.  AM is hard bc stiff in the morning. Plan:Continue Vyvanse 50 mg AM Increase fluoxetine to 30 mg daily.  She doesn't want to go to 40 bc fear of serotonin syndrome.  Thinks she had some of those sx in the past Continue zolpidem 5 mg daily.  01/29/20 appt, reported:  Better with increased fluoxetine re: mood. No SE with meds. Still ADD problems not as well controlled as she'd like. Still problems with sleep and doesn't think ambien workiing as well as it should.  Tried trazodone with Ambien but had hangover and difficulty waking up and not a deep good sleep.  Never tried trazodone alone.  Increased Ambien on her own to 10 mg and ran out early.  Reduced to 5 mg and added Benadryl.  Erratic sleep since Covid.  Go to bed same time 10.  Avoids alcohol 2 hours before. Primary problem is going to sleep.  History of 10 mg Ambien for years.  Says 7.5 mg Ambien will work. Caffeine none after noon. Plan: Increase Vyvanse 60 mg AM continue fluoxetine to 30 mg daily.  She  doesn't want to go to 40 bc fear of serotonin syndrome.  Thinks she had some of those sx in the past.  It helped to increase Increase zolpidem 5-10 mg daily.  08/02/2020 appointment with the following noted: Went back down to 20 mg fluoxetine bc of more HA and fear of serotonin syndrome bc she feels she had it at some point in the past.  Taking 20 mg for mos. Still problems with sleep.  Down to 2.5 mg Ambien bc when takes more feels out of it the next day.   Thinks taking Ambien too long. Too much awakening. No marked benefit with increased Vyvanse to 60 mg and feels more irritable and edgy. However still distractible and inefficient and hard to finish things. Forgetful and loses things. Plan: Reduce Vyvanse to 50 mg AM continue fluoxetine to 20 mg daily DT her fear of serotonin syndrome.  Thinks she had some of those sx in the past.  May have reduced benefit DT lower dose. Trial Belsomra 15-20 mg or Dayvigo for sleep instead of Ambien bc hangover with 5 mg daily.  11/01/2020 appointment with the following noted: Phone call 08/16/2020:Rtc to patient and she did try the Renova and reports having the "hang over and drowsy" effect. Same as the trazodone.  She is asking to try Lunesta instead.  10/07/2020 she  called back again stating she is wanted to go back to Ambien 5 mg. Ambien seems to work again and getting 8 hours now. Re: reduction vyvanse to 50 mg.  Now feels that it's now working well.  Very distractible and not finishing things.  Not affordable to take the biologicals any more and had to stop but pain is not worse. Plan increase Vyvanse to 60 mg daily. For sleep return to Ambien 5 mg nightly because of failures of alternatives.  03/08/2021 appointment with the following noted: Doing good overall.   Taking 7.5 mg Ambien.  Need to sleep and satisfied with it now. Overall in a good place emotionally and mentally.  Satisfied with meds. No SE now. Tolerated Vyvanse 60 fine and pleased  with it.   Prednisone back a couple of weeks ago.  Pain can awaken her also. Pleased to sell property. Plan: no med changes  09/07/2021 appointment with the following noted: Worst couple of mos.   July flair up of arthritis, the worst ever related to stress, the heat and over exertion. Took 5 mos to see new rheumatologist and sees them tomorrow. Got more down with pain. On prednisone it helped.  On 20 mg prednisone had irritability and ADD worse and insomnia.  Forgetful, distractible to marked degree.  Trouble making decisions.  Rewriting lists of things to do wasting time.  Better cognition with less prednisone.  Would rather hurt than be like this. Almost nonfunctional at this time.  Always doing things.   Having BA remodeled.  Sleep 6-7 hours. Plan: Yes trial Namenda off label. continue fluoxetine to 20 mg daily DT her fear of serotonin syndrome.  Thinks she had some of those sx in the past.  May have reduced benefit DT lower dose. Per her request to increase Vyvanse to 70 mg every morning for better benefit for cognition.  11/10/2021 appointment with the following noted: Not on Namenda at this time. New rheum dx CPPD and wondering if she had psoriatic arthritis and wanted to try new drugs. Lives in [place] with a lot of stairs.  At [facility] with more problems with joints.  Worry over new doctor and new dx with FU MRI 2 weeks ago.  Worst flare up ever and took her down mentally.  Pain interfered with sleep.  Missed 2 mos of hair appts.  Couldn't function DT pain.Need to restart prednisone and get injections from Dr. Fransisco Beau yesterday.  Prednisone helped. Thought Namenda helped some but caused dizziness.  Stopped it. Had gotten down to 1/4 Ambien and melatonin but prednisone increased insomnia. Back on 5 mg Ambien. Inflammatory flareup if overworks. Got mild Covid and H Covid with no sx. Health stress and health care issues. Still all over the place. Plan no med chages  05/15/2022  appointment noted: Taking Vyvanse 60 bc can't get 70's Overall doing well lately.  [place]. GD born early [place] beach and she went to help them.  Born at 34 weeks and GI issues.   D from [place] came to visit for a couple of weeks.   RA flare up.  Ongoing problem. Mood is ok.    11/16/22 appt noted: Ongoing med px. No med changes Vyvanse 70 AM, fluoxetine 20 mg daily Renovation triggering bursitis affecting sleep DT pain and psoriasis flares.  Going to [facility] Arthritis is severe and problematic.   Stress medical bills. Not markedly depressed but down over medical problems with severe  arthritis. Some memory issues.  Not severe.   Still starts things she doesn't finish. No SE. D last baby born premature.    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  Helping to care [place] 63[place]  Past Psychiatric Medication Trials:  trazodone hangover, Ambien, belsomra, Dayvigo to expensive, melatonin, Benadryl hangover and combos (Lunesta never tried) Xanax hangover Fluoxetine 60, sertraline NR, duloxetine, Lexapro, Wellbutrin, buspirone,  Ritalin LA, Concerta 54, Adderall 20 dizzy, Vyvanse 60  Under care at this office since July 2002   Review of Systems:  Review of Systems  Cardiovascular:  Negative for palpitations.  Musculoskeletal:  Positive for arthralgias, back pain, gait problem and joint swelling.  Skin:  Positive for rash.  Neurological:  Negative for dizziness and tremors.  Psychiatric/Behavioral:  Positive for decreased concentration and sleep disturbance. Negative for agitation, behavioral problems, confusion, dysphoric mood, hallucinations, self-injury and suicidal ideas. The patient is not nervous/anxious and is not hyperactive.     Medications: I have reviewed the patient's current medications.  Current Outpatient Medications  Medication Sig Dispense Refill   aspirin 81 MG tablet Take 81 mg  by mouth daily.     calcium carbonate (OS-CAL - DOSED IN MG OF ELEMENTAL CALCIUM) 1250 (500 Ca) MG tablet      celecoxib (CELEBREX) 200 MG capsule Take 1 capsule by mouth once daily 90 capsule 0   cholecalciferol (VITAMIN D) 1000 UNITS tablet Take 1,000 Units by mouth daily.     clobetasol (TEMOVATE) 0.05 % external solution as needed.     diclofenac Sodium (VOLTAREN) 1 % GEL APPLY 2 GRAMS TO AFFECTED AREA 4 TIMES A DAY 300 g 3   fluocinonide ointment (LIDEX) 0.05 % APPLY TWICE DAILY TO AFFECTED AREAS UNTIL IMPROVED THEN AS NEEDED FOR FLARES     FLUoxetine (PROZAC) 20 MG capsule TAKE 1 CAPSULE BY MOUTH EVERY DAY 90 capsule 1   hydroxychloroquine (PLAQUENIL) 200 MG tablet Take 400 mg by mouth daily.     ipratropium (ATROVENT) 0.03 % nasal spray Place into both nostrils.     leflunomide (ARAVA) 20 MG tablet Take 20 mg by mouth daily.     magnesium 30 MG tablet Take 30 mg by mouth daily.     melatonin 5 MG TABS Take 5 mg by mouth at bedtime.     Multiple Vitamin (MULTIVITAMIN) tablet Take 1 tablet by mouth daily.     nystatin-triamcinolone ointment (MYCOLOG) Apply 1 application topically 2 (two) times daily. 30 g 1   Omega-3 Fatty Acids (FISH OIL) 1200 MG CAPS Take 1,200 mg by mouth daily.      oxyCODONE-acetaminophen (PERCOCET/ROXICET) 5-325 MG tablet Take 1-2 tablets by mouth daily as needed for moderate pain. Do not refill less than 30 days from prior refill 60 tablet 0   Polyethyl Glycol-Propyl Glycol 0.4-0.3 % SOLN Place 1 drop into both eyes daily as needed (for dry eyes).     predniSONE (DELTASONE) 10 MG tablet Take 10 mg by mouth 2 (two) times daily. Taking '15mg'$  currently due to flare     Probiotic Product (PROBIOTIC DAILY PO) Take 1 capsule by mouth daily.     triamcinolone cream (KENALOG) 0.1 % APPLY TO AFFECTED AREA TWICE A DAY UNTIL CLEAR THEN AS NEEDED     trospium (SANCTURA) 20 MG tablet TAKE 1 TABLET BY MOUTH TWICE A DAY 180 tablet 1   Turmeric 500 MG TABS Take 1 tablet by mouth  daily.  lisdexamfetamine (VYVANSE) 70 MG capsule Take 1 capsule (70 mg total) by mouth daily. 30 capsule 0   [START ON 12/14/2022] lisdexamfetamine (VYVANSE) 70 MG capsule Take 1 capsule (70 mg total) by mouth daily. 30 capsule 0   [START ON 01/11/2023] lisdexamfetamine (VYVANSE) 70 MG capsule Take 1 capsule (70 mg total) by mouth daily. 30 capsule 0   No current facility-administered medications for this visit.    Medication Side Effects: None  Allergies:  Allergies  Allergen Reactions   Cosentyx [Secukinumab] Rash    Severe case of psorasis   Sertraline Hcl Other (See Comments)    Made me crazy    Avelox [Moxifloxacin Hcl In Nacl]     Pt cant remember reaction   Nitrofurantoin     Other reaction(s): Other (See Comments) tired Pt cant remember reaction Other reaction(s): Unknown   Rosuvastatin     Other reaction(s): Unknown   Latex Itching and Rash    Redness   Macrobid [Nitrofurantoin Monohyd Macro] Other (See Comments)    tired   Statins Other (See Comments)    Muscle aches    Past Medical History:  Diagnosis Date   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   ADHD (attention deficit hyperactivity disorder)    Allergy    Anxiety    Arthritis    Cataract    bil cateracts removed   Complication of anesthesia    first colonoscopy pt states she woke up   Depression    GERD (gastroesophageal reflux disease)    Globus sensation    Hyperlipidemia    Incontinence    bowels at times   Insomnia    Multinodular goiter (nontoxic)    Psoriatic arthritis (HCC)    Spinal stenosis of lumbar region 03/10/2015   MRI    Statin intolerance 01/27/2013    Family History  Problem Relation Age of Onset   Heart disease Mother    Hyperlipidemia Mother    Coronary artery disease Father 578      CABG in early 516's  Hyperlipidemia Father    Epilepsy Grandchild        Severe form   Colon cancer Neg Hx    Esophageal cancer Neg Hx    Rectal cancer Neg Hx    Stomach cancer Neg Hx     Colon polyps Neg Hx    Crohn's disease Neg Hx     Social History   Socioeconomic History   Marital status: Married    Spouse name: Not on file   Number of children: 2   Years of education: Not on file   Highest education level: Not on file  Occupational History   Occupation: disability  Tobacco Use   Smoking status: Former    Types: Cigarettes    Quit date: 08/21/1971    Years since quitting: 51.2    Passive exposure: Never   Smokeless tobacco: Never  Vaping Use   Vaping Use: Never used  Substance and Sexual Activity   Alcohol use: Yes    Alcohol/week: 7.0 standard drinks of alcohol    Types: 7 Glasses of wine per week    Comment: daily,wine   Drug use: No   Sexual activity: Not on file  Other Topics Concern   Not on file  Social History Narrative   Married, retired RTherapist, sports  2 daughters some grandchildren   1 caffeinated drinks daily   1 alcoholic beverage daily   No tobacco  Social Determinants of Health   Financial Resource Strain: Low Risk  (08/17/2022)   Overall Financial Resource Strain (CARDIA)    Difficulty of Paying Living Expenses: Not hard at all  Food Insecurity: No Food Insecurity (08/17/2022)   Hunger Vital Sign    Worried About Running Out of Food in the Last Year: Never true    Ran Out of Food in the Last Year: Never true  Transportation Needs: No Transportation Needs (08/17/2022)   PRAPARE - Hydrologist (Medical): No    Lack of Transportation (Non-Medical): No  Physical Activity: Insufficiently Active (08/17/2022)   Exercise Vital Sign    Days of Exercise per Week: 7 days    Minutes of Exercise per Session: 20 min  Stress: No Stress Concern Present (08/17/2022)   [place]    Feeling of Stress : Not at all  Social Connections: Unknown (08/17/2022)   Social Connection and Isolation Panel [NHANES]    Frequency of Communication with Friends and Family:  Not on file    Frequency of Social Gatherings with Friends and Family: Not on file    Attends Religious Services: Not on file    Active Member of Clubs or Organizations: Not on file    Attends Archivist Meetings: Not on file    Marital Status: Married  Intimate Partner Violence: Not At Risk (08/17/2022)   Humiliation, Afraid, Rape, and Kick questionnaire    Fear of Current or Ex-Partner: No    Emotionally Abused: No    Physically Abused: No    Sexually Abused: No    Past Medical History, Surgical history, Social history, and Family history were reviewed and updated as appropriate.   Please see review of systems for further details on the patient's review from today.   Objective:   Physical Exam:  BP (!) 148/87   Pulse (!) 103   Physical Exam Constitutional:      General: She is not in acute distress.    Appearance: Normal appearance. She is well-developed.  Musculoskeletal:        General: No deformity.  Neurological:     Mental Status: She is alert and oriented to person, place, and time.     Motor: No tremor.     Coordination: Coordination normal.     Gait: Gait normal.  Psychiatric:        Attention and Perception: Perception normal. She is inattentive.        Mood and Affect: Mood is anxious. Mood is not depressed. Affect is not labile, angry or inappropriate.        Speech: Speech normal.        Behavior: Behavior normal.        Thought Content: Thought content normal. Thought content is not delusional. Thought content does not include homicidal or suicidal ideation. Thought content does not include suicidal plan.        Cognition and Memory: Cognition normal.        Judgment: Judgment normal.     Comments: Insight intact. No auditory or visual hallucinations. No delusions.  Talkative per usual. Mood stressed. Some chronic ADD     Lab Review:     Component Value Date/Time   NA 139 11/09/2022 1129   K 4.1 11/09/2022 1129   CL 101 11/09/2022 1129    CO2 30 11/09/2022 1129   GLUCOSE 128 (H) 11/09/2022 1129   BUN 25 (H) 11/09/2022 1129  CREATININE 0.83 11/09/2022 1129   CALCIUM 9.6 11/09/2022 1129   PROT 6.8 11/09/2022 1129   ALBUMIN 4.3 11/09/2022 1129   AST 21 11/09/2022 1129   ALT 21 11/09/2022 1129   ALKPHOS 46 11/09/2022 1129   BILITOT 0.4 11/09/2022 1129   GFRNONAA >60 02/18/2016 0322   GFRAA >60 02/18/2016 0322       Component Value Date/Time   WBC 5.4 01/31/2022 1333   RBC 3.77 (L) 01/31/2022 1333   HGB 12.3 01/31/2022 1333   HCT 36.7 01/31/2022 1333   PLT 233.0 01/31/2022 1333   MCV 97.4 01/31/2022 1333   MCH 31.0 02/18/2016 0322   MCHC 33.6 01/31/2022 1333   RDW 13.2 01/31/2022 1333   LYMPHSABS 0.8 01/31/2022 1333   MONOABS 0.3 01/31/2022 1333   EOSABS 0.0 01/31/2022 1333   BASOSABS 0.0 01/31/2022 1333    No results found for: "POCLITH", "LITHIUM"   No results found for: "PHENYTOIN", "PHENOBARB", "VALPROATE", "CBMZ"   .res Assessment: Plan:    Kindal was seen today for follow-up, adhd and depression.  Diagnoses and all orders for this visit:  Attention deficit hyperactivity disorder (ADHD), predominantly inattentive type -     lisdexamfetamine (VYVANSE) 70 MG capsule; Take 1 capsule (70 mg total) by mouth daily. -     lisdexamfetamine (VYVANSE) 70 MG capsule; Take 1 capsule (70 mg total) by mouth daily. -     lisdexamfetamine (VYVANSE) 70 MG capsule; Take 1 capsule (70 mg total) by mouth daily.  Major depression, recurrent, full remission ([place])  Insomnia due to mental condition  psoriatic arthritis, OA, and inflammatory   Greater than 50% of 30 min face to face time with patient was spent on counseling and coordination of care. We discussed the following.    Discussed potential benefits, risks, and side effects of stimulants with patient to include increased heart rate, palpitations, insomnia, increased anxiety, increased irritability, or decreased appetite.  Instructed patient to contact  office if experiencing any significant tolerability issues.  Disc risk steroids affecting mood and sleep.  Supportive therapy dealing with stubborn H with memory problems. Disc  rheum consultation and stress from dealing with it.    tolerating Vyvanse 70 mg AM and better focus than at 60 mg AM  Consider memantine off label because of chronic poorly controlled ADHD. Option Qelbree Option retry lower dose Namenda off label.  continue fluoxetine to 20 mg daily DT her fear of serotonin syndrome.  Thinks she had some of those sx in the past.  May have reduced benefit DT lower dose.  Able to stop Ambien.  Using melatonin and CBD gummy  Consider alternative treatments for ADD including off label.  FU 6 mos.  Lynder Parents, MD, DFAPA   Please see After Visit Summary for patient specific instructions.  Future Appointments  Date Time Provider [place]  02/07/2023  2:00 PM Crecencio Mc, MD [id number]    No orders of the defined types were placed in this encounter.      -------------------------------

## 2022-12-01 DIAGNOSIS — Z1159 Encounter for screening for other viral diseases: Secondary | ICD-10-CM | POA: Diagnosis not present

## 2022-12-01 DIAGNOSIS — L4050 Arthropathic psoriasis, unspecified: Secondary | ICD-10-CM | POA: Diagnosis not present

## 2022-12-01 DIAGNOSIS — Z7289 Other problems related to lifestyle: Secondary | ICD-10-CM | POA: Diagnosis not present

## 2022-12-06 DIAGNOSIS — L4050 Arthropathic psoriasis, unspecified: Secondary | ICD-10-CM | POA: Diagnosis not present

## 2022-12-20 DIAGNOSIS — L4050 Arthropathic psoriasis, unspecified: Secondary | ICD-10-CM | POA: Diagnosis not present

## 2022-12-20 DIAGNOSIS — Z79899 Other long term (current) drug therapy: Secondary | ICD-10-CM | POA: Diagnosis not present

## 2022-12-24 ENCOUNTER — Other Ambulatory Visit: Payer: Self-pay | Admitting: Psychiatry

## 2022-12-24 DIAGNOSIS — F3342 Major depressive disorder, recurrent, in full remission: Secondary | ICD-10-CM

## 2022-12-26 ENCOUNTER — Telehealth: Payer: Self-pay | Admitting: Internal Medicine

## 2022-12-26 DIAGNOSIS — M1990 Unspecified osteoarthritis, unspecified site: Secondary | ICD-10-CM

## 2022-12-26 MED ORDER — OXYCODONE-ACETAMINOPHEN 5-325 MG PO TABS: 1.0000 | ORAL_TABLET | Freq: Every day | ORAL | 0 refills | Status: DC | PRN

## 2022-12-26 NOTE — Telephone Encounter (Signed)
Requesting: Oxycodone-acetaminophen 5-325 mg Contract: No UDS: No Last Visit: 11/09/2022 Next Visit: 02/07/2023 Last Refill: 11/09/2022  Please Advise

## 2022-12-26 NOTE — Telephone Encounter (Signed)
Needs narcotic contract signed AT NEXT VISIT . Oxycdone refills for March 19 and April 19 sent

## 2022-12-26 NOTE — Telephone Encounter (Signed)
Prescription Request  12/26/2022  LOV: 11/09/2022  What is the name of the medication or equipment? oxyCODONE-acetaminophen (PERCOCET/ROXICET) 5-325 MG tablet  Have you contacted your pharmacy to request a refill? Yes   Which pharmacy would you like this sent to?   [facility] [place] [place] [address] [place] [address] Phone: [phone] Fax: [phone]      Patient notified that their request is being sent to the clinical staff for review and that they should receive a response within 2 business days.   Please advise at Mobile [phone] (mobile)   As per pt, she would like to have a 3 months supply sent over, so she don't have to keep calling the office about her refills.

## 2022-12-27 NOTE — Telephone Encounter (Signed)
Pt is aware.  

## 2022-12-31 ENCOUNTER — Other Ambulatory Visit: Payer: Self-pay | Admitting: Internal Medicine

## 2023-01-10 DIAGNOSIS — L4050 Arthropathic psoriasis, unspecified: Secondary | ICD-10-CM | POA: Diagnosis not present

## 2023-01-29 ENCOUNTER — Telehealth: Payer: Self-pay | Admitting: Psychiatry

## 2023-01-29 ENCOUNTER — Other Ambulatory Visit: Payer: Self-pay | Admitting: Internal Medicine

## 2023-01-29 ENCOUNTER — Other Ambulatory Visit: Payer: Self-pay | Admitting: Psychiatry

## 2023-01-29 DIAGNOSIS — F900 Attention-deficit hyperactivity disorder, predominantly inattentive type: Secondary | ICD-10-CM

## 2023-01-29 MED ORDER — LISDEXAMFETAMINE DIMESYLATE 70 MG PO CAPS
70.0000 mg | ORAL_CAPSULE | Freq: Every day | ORAL | 0 refills | Status: DC
Start: 2023-02-26 — End: 2023-05-17

## 2023-01-29 MED ORDER — LISDEXAMFETAMINE DIMESYLATE 70 MG PO CAPS
70.0000 mg | ORAL_CAPSULE | Freq: Every day | ORAL | 0 refills | Status: DC
Start: 2023-01-29 — End: 2023-05-17

## 2023-01-29 NOTE — Telephone Encounter (Signed)
[facility] and was told no further RX on file for Vyvanse. Last filled per pt 3/21. [facility] and was told they don't have it. Can we cancel it and res[facility] it to [facility], please? [id number] [place], [place] - [phone]  [address] [place]

## 2023-02-06 DIAGNOSIS — M5136 Other intervertebral disc degeneration, lumbar region: Secondary | ICD-10-CM | POA: Diagnosis not present

## 2023-02-06 DIAGNOSIS — M4126 Other idiopathic scoliosis, lumbar region: Secondary | ICD-10-CM | POA: Diagnosis not present

## 2023-02-07 ENCOUNTER — Ambulatory Visit (INDEPENDENT_AMBULATORY_CARE_PROVIDER_SITE_OTHER): Payer: Medicare HMO | Admitting: Internal Medicine

## 2023-02-07 ENCOUNTER — Encounter: Payer: Self-pay | Admitting: Internal Medicine

## 2023-02-07 VITALS — BP 142/60 | HR 109 | Temp 99.2°F | Ht 66.0 in | Wt 160.4 lb

## 2023-02-07 DIAGNOSIS — R233 Spontaneous ecchymoses: Secondary | ICD-10-CM | POA: Diagnosis not present

## 2023-02-07 DIAGNOSIS — L4052 Psoriatic arthritis mutilans: Secondary | ICD-10-CM | POA: Diagnosis not present

## 2023-02-07 DIAGNOSIS — R58 Hemorrhage, not elsewhere classified: Secondary | ICD-10-CM | POA: Diagnosis not present

## 2023-02-07 MED ORDER — CELECOXIB 200 MG PO CAPS: 200.0000 mg | ORAL_CAPSULE | Freq: Two times a day (BID) | ORAL | 1 refills | Status: DC

## 2023-02-07 MED ORDER — OXYCODONE-ACETAMINOPHEN 5-325 MG PO TABS: 1.0000 | ORAL_TABLET | Freq: Three times a day (TID) | ORAL | 0 refills | Status: DC | PRN

## 2023-02-07 MED ORDER — DOXYCYCLINE HYCLATE 100 MG PO TABS
100.0000 mg | ORAL_TABLET | Freq: Two times a day (BID) | ORAL | 0 refills | Status: DC
Start: 2023-02-07 — End: 2023-05-16

## 2023-02-07 NOTE — Progress Notes (Signed)
Subjective:  Patient ID: Madeline Young, female    DOB: February 05, 1956  Age: 67 y.o. MRN: [id number]  CC: The primary encounter diagnosis was Petechial rash. Diagnoses of Bleeding and Psoriatic arthritis, destructive type (HCC) were also pertinent to this visit.   HPI Madeline Young presents for  Chief Complaint  Patient presents with   Medical Management of Chronic Issues   1) Chronic joint pain secondary to psoriatic arthritis  ,managed with oxycodone/APAP. Refill history confirmed via [place] Controlled Substance databas, accessed by me today.. last refill history: aprl 29, march 19 and Feb 1 .    Pain is worse today (see below)    2) Arthritis :  pain has been worse since she has been receiving loading doses of  infliximab by [facility] Dr. Berton Young,  her last dose April 3,  ,  with 20 mg methylprednisolone. Joint pain has been worsening  hands,  shoulders,  (told she had bursitis from the kitchen remodel) which has been persistent. Right arm has become weak.  MRI shoulder ordered by [facility]  rheumatology.  Several weeks out for MRI  3) Low back pain alternating sides .  Has a recurrent pulling sensation after < 5 minutes of walking.  Had a lumbar fusion 7 yrs ago.  Spasming and vibratory pain .  Saw Madeline Young at [facility]  yesterday.  Needs imaging vs myelogram , waiting for insurance approval , but has to have PT first.    3)  Familial hyperlipidemia: untreated due to intolerance to statins, zetia and Repatha.  Calcium CT score ws arecently ZERO  4) NEW ONSET RASH ON CHEST. SUN EXPOSED AREAS ONLY.  UNEXPLAINED SCABS ON LEG, DOES LIGHT MINIMAL YARDWORK OUTSIDE.   Outpatient Medications Prior to Visit  Medication Sig Dispense Refill   aspirin 81 MG tablet Take 81 mg by mouth daily.     calcium carbonate (OS-CAL - DOSED IN MG OF ELEMENTAL CALCIUM) 1250 (500 Ca) MG tablet      cholecalciferol (VITAMIN D) 1000 UNITS tablet Take 1,000 Units by mouth daily.     clobetasol  (TEMOVATE) 0.05 % external solution as needed.     diclofenac Sodium (VOLTAREN) 1 % GEL APPLY 2 GRAMS TO AFFECTED AREA 4 TIMES A DAY 300 g 3   fluocinonide ointment (LIDEX) 0.05 % APPLY TWICE DAILY TO AFFECTED AREAS UNTIL IMPROVED THEN AS NEEDED FOR FLARES     FLUoxetine (PROZAC) 20 MG capsule TAKE 1 CAPSULE BY MOUTH EVERY DAY 90 capsule 1   ipratropium (ATROVENT) 0.03 % nasal spray Place into both nostrils.     leflunomide (ARAVA) 20 MG tablet Take 20 mg by mouth daily.     lisdexamfetamine (VYVANSE) 70 MG capsule Take 1 capsule (70 mg total) by mouth daily. 30 capsule 0   lisdexamfetamine (VYVANSE) 70 MG capsule Take 1 capsule (70 mg total) by mouth daily. 30 capsule 0   [START ON 02/26/2023] lisdexamfetamine (VYVANSE) 70 MG capsule Take 1 capsule (70 mg total) by mouth daily. 30 capsule 0   magnesium 30 MG tablet Take 30 mg by mouth daily.     melatonin 5 MG TABS Take 5 mg by mouth at bedtime.     Multiple Vitamin (MULTIVITAMIN) tablet Take 1 tablet by mouth daily.     nystatin-triamcinolone ointment (MYCOLOG) Apply 1 application topically 2 (two) times daily. 30 g 1   Omega-3 Fatty Acids (FISH OIL) 1200 MG CAPS Take 1,200 mg by mouth daily.  oxyCODONE-acetaminophen (PERCOCET/ROXICET) 5-325 MG tablet Take 1-2 tablets by mouth daily as needed for moderate pain. 60 tablet 0   Polyethyl Glycol-Propyl Glycol 0.4-0.3 % SOLN Place 1 drop into both eyes daily as needed (for dry eyes).     predniSONE (DELTASONE) 10 MG tablet Take 10 mg by mouth 2 (two) times daily. Taking 15mg  currently due to flare     Probiotic Product (PROBIOTIC DAILY PO) Take 1 capsule by mouth daily.     triamcinolone cream (KENALOG) 0.1 % APPLY TO AFFECTED AREA TWICE A DAY UNTIL CLEAR THEN AS NEEDED     trospium (SANCTURA) 20 MG tablet TAKE 1 TABLET BY MOUTH TWICE A DAY 180 tablet 1   Turmeric 500 MG TABS Take 1 tablet by mouth daily.     celecoxib (CELEBREX) 200 MG capsule Take 1 capsule by mouth once daily 90 capsule 1    oxyCODONE-acetaminophen (PERCOCET/ROXICET) 5-325 MG tablet Take 1-2 tablets by mouth daily as needed for moderate pain. Do not refill less than 30 days from prior refill 60 tablet 0   hydroxychloroquine (PLAQUENIL) 200 MG tablet Take 400 mg by mouth daily. (Patient not taking: Reported on 02/07/2023)     No facility-administered medications prior to visit.    Review of Systems;  Patient denies headache, fevers, malaise, unintentional weight loss, skin rash, eye pain, sinus congestion and sinus pain, sore throat, dysphagia,  hemoptysis , cough, dyspnea, wheezing, chest pain, palpitations, orthopnea, edema, abdominal pain, nausea, melena, diarrhea, constipation, flank pain, dysuria, hematuria, urinary  Frequency, nocturia, numbness, tingling, seizures,  Focal weakness, Loss of consciousness,  Tremor, insomnia, depression, anxiety, and suicidal ideation.      Objective:  BP (!) 142/60   Pulse (!) 109   Temp 99.2 F (37.3 C) (Oral)   Ht 5\' 6"  (1.676 m)   Wt 160 lb 6.4 oz (72.8 kg)   SpO2 98%   BMI 25.89 kg/m   BP Readings from Last 3 Encounters:  02/07/23 (!) 142/60  11/09/22 (!) 120/58  08/09/22 134/86    Wt Readings from Last 3 Encounters:  02/07/23 160 lb 6.4 oz (72.8 kg)  11/09/22 161 lb 3.2 oz (73.1 kg)  08/17/22 164 lb (74.4 kg)    Physical Exam Vitals reviewed.  Constitutional:      General: She is not in acute distress.    Appearance: Normal appearance. She is normal weight. She is not ill-appearing, toxic-appearing or diaphoretic.  HENT:     Head: Normocephalic.  Eyes:     General: No scleral icterus.       Right eye: No discharge.        Left eye: No discharge.     Conjunctiva/sclera: Conjunctivae normal.  Cardiovascular:     Rate and Rhythm: Normal rate and regular rhythm.     Heart sounds: Normal heart sounds.  Pulmonary:     Effort: Pulmonary effort is normal. No respiratory distress.     Breath sounds: Normal breath sounds.  Musculoskeletal:         General: Swelling, tenderness and deformity present. Normal range of motion.     Comments: Diffusely tender in shoulders,  MCPs, lower spine and hips  Skin:    General: Skin is warm and dry.  Neurological:     General: No focal deficit present.     Mental Status: She is alert and oriented to person, place, and time. Mental status is at baseline.  Psychiatric:        Mood and Affect: Mood normal.  Behavior: Behavior normal.        Thought Content: Thought content normal.        Judgment: Judgment normal.    Lab Results  Component Value Date   HGBA1C 6.1 01/31/2022    Lab Results  Component Value Date   CREATININE 0.83 11/09/2022   CREATININE 0.72 11/09/2021   CREATININE 0.72 02/09/2021    Lab Results  Component Value Date   WBC 5.4 01/31/2022   HGB 12.3 01/31/2022   HCT 36.7 01/31/2022   PLT 233.0 01/31/2022   GLUCOSE 128 (H) 11/09/2022   CHOL 365 (H) 11/09/2022   TRIG 81.0 11/09/2022   HDL 71.40 11/09/2022   LDLDIRECT 256.0 11/09/2022   LDLCALC 277 (H) 11/09/2022   ALT 21 11/09/2022   AST 21 11/09/2022   NA 139 11/09/2022   K 4.1 11/09/2022   CL 101 11/09/2022   CREATININE 0.83 11/09/2022   BUN 25 (H) 11/09/2022   CO2 30 11/09/2022   TSH 0.96 01/31/2022   HGBA1C 6.1 01/31/2022   MICROALBUR 1.7 01/31/2022    DG Shoulder Right  Result Date: 11/09/2022 CLINICAL DATA:  Acute right shoulder pain for 1 and half months. No known injury. EXAM: RIGHT SHOULDER - 2+ VIEW COMPARISON:  None Available. FINDINGS: No acute fracture or dislocation. Mild degenerative arthritis glenohumeral and AC joints. IMPRESSION: No acute fracture or dislocation. Electronically Signed   By: Minerva Fester M.D.   On: 11/09/2022 00:02    Assessment & Plan:  .Petechial rash -     CBC with Differential/Platelet -     Sedimentation rate -     C-reactive protein  Bleeding -     Protime-INR -     APTT  Psoriatic arthritis, destructive type (HCC) Assessment & Plan: Managed with  Remicaide infusions currently ,  With prednisone tapers for flares. Her pain has become less controlled on oxycodone,  Taken twice daily.  Prescription updated to allow a third dose daily .  Repeat ESR and CRP    Other orders -     Celecoxib; Take 1 capsule (200 mg total) by mouth 2 (two) times daily.  Dispense: 180 capsule; Refill: 1 -     oxyCODONE-Acetaminophen; Take 1 tablet by mouth every 8 (eight) hours as needed for moderate pain. Do not refill less than 30 days from prior refill  Dispense: 90 tablet; Refill: 0 -     Doxycycline Hyclate; Take 1 tablet (100 mg total) by mouth 2 (two) times daily.  Dispense: 14 tablet; Refill: 0     I provided 33 minutes of face-to-face time during this encounter reviewing patient's last visit with me, patient's  most recent visit with rheumatology,   recent surgical and non surgical procedures, previous  labs and imaging studies, counseling on currently addressed issues,  and post visit ordering to diagnostics and therapeutics .   Follow-up: Return in about 3 months (around 05/10/2023).   Sherlene Shams, MD

## 2023-02-07 NOTE — Assessment & Plan Note (Signed)
Managed with Remicaide infusions currently ,  With prednisone tapers for flares. Her pain has become less controlled on oxycodone,  Taken twice daily.  Prescription updated to allow a third dose daily .  Repeat ESR and CRP

## 2023-02-07 NOTE — Patient Instructions (Signed)
I have increased the amount of oxycodone to 3 daily  Since your last refill was on April 29,  the pharmacy will be able to refill on May 18  The rash on your leg is  petechial,  but not RMSF given its restriction to the legs.    The rash on the chest looks like a sun exposed pattern  so keep it out of direct sunlight   [facility] Spotted Fever  can present with fever and a headache, NOT a rash; s the occurrence of these symptoms during spring/summer/fall in the context of recent tick exposure is RMSF until proven otherwise and should be treated immediately with doxycycline.  I have sent this rx to your pharmacy to use if you develop these symptoms.  Let me know if this occurs so we can set you up for the appropriate testing .

## 2023-02-08 LAB — C-REACTIVE PROTEIN: CRP: <1 mg/dL (ref 0.5–20.0)

## 2023-02-08 LAB — CBC WITH DIFFERENTIAL/PLATELET
Basophils Absolute: 0 10*3/uL (ref 0.0–0.1)
Basophils Relative: 0.5 % (ref 0.0–3.0)
Eosinophils Absolute: 0 10*3/uL (ref 0.0–0.7)
Eosinophils Relative: 0.1 % (ref 0.0–5.0)
HCT: 37 % (ref 36.0–46.0)
Hemoglobin: 12.4 g/dL (ref 12.0–15.0)
Lymphocytes Relative: 5.7 % — ABNORMAL LOW (ref 12.0–46.0)
Lymphs Abs: 0.5 10*3/uL — ABNORMAL LOW (ref 0.7–4.0)
MCHC: 33.4 g/dL (ref 30.0–36.0)
MCV: 97.6 fl (ref 78.0–100.0)
Monocytes Absolute: 0.3 10*3/uL (ref 0.1–1.0)
Monocytes Relative: 3.4 % (ref 3.0–12.0)
Neutro Abs: 7.6 10*3/uL (ref 1.4–7.7)
Neutrophils Relative %: 90.3 % — ABNORMAL HIGH (ref 43.0–77.0)
Platelets: 300 10*3/uL (ref 150.0–400.0)
RBC: 3.79 Mil/uL — ABNORMAL LOW (ref 3.87–5.11)
RDW: 13.9 % (ref 11.5–15.5)
WBC: 8.4 10*3/uL (ref 4.0–10.5)

## 2023-02-08 LAB — PROTIME-INR
INR: 1
Prothrombin Time: 10.5 s (ref 9.0–11.5)

## 2023-02-08 LAB — APTT: aPTT: 26 s (ref 23–32)

## 2023-02-08 LAB — SEDIMENTATION RATE: Sed Rate: 29 mm/hr (ref 0–30)

## 2023-02-13 ENCOUNTER — Other Ambulatory Visit: Payer: Self-pay | Admitting: Neurological Surgery

## 2023-02-13 DIAGNOSIS — M4126 Other idiopathic scoliosis, lumbar region: Secondary | ICD-10-CM

## 2023-02-15 ENCOUNTER — Ambulatory Visit
Admission: RE | Admit: 2023-02-15 | Discharge: 2023-02-15 | Disposition: A | Discharge status: Home / Self Care | Payer: Medicare HMO | Source: Ambulatory Visit | Attending: Neurological Surgery | Admitting: Neurological Surgery

## 2023-02-15 DIAGNOSIS — M545 Low back pain, unspecified: Secondary | ICD-10-CM | POA: Diagnosis not present

## 2023-02-15 DIAGNOSIS — I700 Atherosclerosis of aorta: Secondary | ICD-10-CM | POA: Diagnosis not present

## 2023-02-15 DIAGNOSIS — M4126 Other idiopathic scoliosis, lumbar region: Secondary | ICD-10-CM

## 2023-02-15 MED ORDER — DIAZEPAM 5 MG PO TABS
5.0000 mg | ORAL_TABLET | Freq: Once | ORAL | Status: AC
  Administered 2023-02-15: 5 mg via ORAL

## 2023-02-15 MED ORDER — MEPERIDINE HCL 50 MG/ML IJ SOLN: 50.0000 mg | Freq: Once | INTRAMUSCULAR | Status: DC | PRN

## 2023-02-15 MED ORDER — ONDANSETRON HCL 4 MG/2ML IJ SOLN: 4.0000 mg | Freq: Once | INTRAMUSCULAR | Status: DC | PRN

## 2023-02-15 MED ORDER — IOPAMIDOL (ISOVUE-M 200) INJECTION 41%
18.0000 mL | Freq: Once | INTRAMUSCULAR | Status: AC
  Administered 2023-02-15: 18 mL via INTRATHECAL

## 2023-02-15 NOTE — Discharge Instructions (Signed)

## 2023-02-22 ENCOUNTER — Encounter: Payer: Self-pay | Admitting: Internal Medicine

## 2023-02-22 NOTE — Telephone Encounter (Signed)
Is it okay to print a DME order for a rolling walker with a seat?

## 2023-02-23 DIAGNOSIS — L309 Dermatitis, unspecified: Secondary | ICD-10-CM | POA: Diagnosis not present

## 2023-02-26 DIAGNOSIS — M25411 Effusion, right shoulder: Secondary | ICD-10-CM | POA: Diagnosis not present

## 2023-02-26 DIAGNOSIS — S43431A Superior glenoid labrum lesion of right shoulder, initial encounter: Secondary | ICD-10-CM | POA: Diagnosis not present

## 2023-02-26 DIAGNOSIS — M47812 Spondylosis without myelopathy or radiculopathy, cervical region: Secondary | ICD-10-CM | POA: Diagnosis not present

## 2023-02-26 DIAGNOSIS — M4802 Spinal stenosis, cervical region: Secondary | ICD-10-CM | POA: Diagnosis not present

## 2023-02-26 DIAGNOSIS — M75121 Complete rotator cuff tear or rupture of right shoulder, not specified as traumatic: Secondary | ICD-10-CM | POA: Diagnosis not present

## 2023-02-26 DIAGNOSIS — M25511 Pain in right shoulder: Secondary | ICD-10-CM | POA: Diagnosis not present

## 2023-02-26 DIAGNOSIS — S46111A Strain of muscle, fascia and tendon of long head of biceps, right arm, initial encounter: Secondary | ICD-10-CM | POA: Diagnosis not present

## 2023-02-26 DIAGNOSIS — G8929 Other chronic pain: Secondary | ICD-10-CM | POA: Diagnosis not present

## 2023-03-08 DIAGNOSIS — M5416 Radiculopathy, lumbar region: Secondary | ICD-10-CM | POA: Diagnosis not present

## 2023-03-08 DIAGNOSIS — M5126 Other intervertebral disc displacement, lumbar region: Secondary | ICD-10-CM | POA: Diagnosis not present

## 2023-03-11 ENCOUNTER — Encounter: Payer: Self-pay | Admitting: Internal Medicine

## 2023-03-12 ENCOUNTER — Other Ambulatory Visit: Payer: Self-pay | Admitting: Internal Medicine

## 2023-03-12 ENCOUNTER — Encounter: Payer: Self-pay | Admitting: Internal Medicine

## 2023-03-12 MED ORDER — PREGABALIN 50 MG PO CAPS: 50.0000 mg | ORAL_CAPSULE | Freq: Three times a day (TID) | ORAL | 0 refills | Status: DC

## 2023-03-13 DIAGNOSIS — M545 Low back pain, unspecified: Secondary | ICD-10-CM | POA: Diagnosis not present

## 2023-03-13 DIAGNOSIS — M19011 Primary osteoarthritis, right shoulder: Secondary | ICD-10-CM | POA: Diagnosis not present

## 2023-03-13 DIAGNOSIS — M12811 Other specific arthropathies, not elsewhere classified, right shoulder: Secondary | ICD-10-CM | POA: Diagnosis not present

## 2023-03-13 DIAGNOSIS — M89311 Hypertrophy of bone, right shoulder: Secondary | ICD-10-CM | POA: Diagnosis not present

## 2023-03-13 DIAGNOSIS — M75101 Unspecified rotator cuff tear or rupture of right shoulder, not specified as traumatic: Secondary | ICD-10-CM | POA: Diagnosis not present

## 2023-03-14 ENCOUNTER — Telehealth: Payer: Self-pay | Admitting: Internal Medicine

## 2023-03-14 DIAGNOSIS — M159 Polyosteoarthritis, unspecified: Secondary | ICD-10-CM | POA: Diagnosis not present

## 2023-03-14 DIAGNOSIS — M5126 Other intervertebral disc displacement, lumbar region: Secondary | ICD-10-CM | POA: Diagnosis not present

## 2023-03-14 DIAGNOSIS — L409 Psoriasis, unspecified: Secondary | ICD-10-CM | POA: Diagnosis not present

## 2023-03-14 DIAGNOSIS — M1120 Other chondrocalcinosis, unspecified site: Secondary | ICD-10-CM | POA: Diagnosis not present

## 2023-03-14 NOTE — Telephone Encounter (Signed)
Patient called and stated her pharmacy told her she needs a Prior Auth for her pregabalin (LYRICA) 50 MG capsule. Patient also stated that her pharmacy told her they send a form over for the PA on 03/13/2023.

## 2023-03-16 ENCOUNTER — Telehealth: Payer: Self-pay

## 2023-03-16 ENCOUNTER — Other Ambulatory Visit (HOSPITAL_COMMUNITY): Payer: Self-pay

## 2023-03-16 NOTE — Telephone Encounter (Signed)
PA has been submitted, currently pending. Will document in separate encounter.

## 2023-03-16 NOTE — Telephone Encounter (Signed)
Patient Advocate Encounter   Received notification from pt that prior authorization for Pregabalin is required.   PA submitted on 03/16/2023 Key [id number] Insurance [employer] Medicare Status is pending

## 2023-03-16 NOTE — Telephone Encounter (Signed)
Pharmacy Patient Advocate Encounter  Prior Authorization for Pregabalin has been APPROVED by [facility] from 03/16/2023 to 10/09/2023.   PA # [id number]

## 2023-03-19 NOTE — Telephone Encounter (Signed)
noted 

## 2023-03-21 ENCOUNTER — Encounter: Payer: Self-pay | Admitting: Internal Medicine

## 2023-03-23 DIAGNOSIS — M5414 Radiculopathy, thoracic region: Secondary | ICD-10-CM | POA: Diagnosis not present

## 2023-03-24 ENCOUNTER — Encounter: Payer: Self-pay | Admitting: Internal Medicine

## 2023-03-26 ENCOUNTER — Other Ambulatory Visit: Payer: Self-pay | Admitting: Internal Medicine

## 2023-03-26 MED ORDER — OXYCODONE-ACETAMINOPHEN 5-325 MG PO TABS: 1.0000 | ORAL_TABLET | Freq: Four times a day (QID) | ORAL | 0 refills | Status: DC | PRN

## 2023-03-28 ENCOUNTER — Other Ambulatory Visit: Payer: Self-pay | Admitting: Internal Medicine

## 2023-03-28 DIAGNOSIS — M4186 Other forms of scoliosis, lumbar region: Secondary | ICD-10-CM | POA: Diagnosis not present

## 2023-03-28 DIAGNOSIS — I700 Atherosclerosis of aorta: Secondary | ICD-10-CM | POA: Diagnosis not present

## 2023-03-28 DIAGNOSIS — M5135 Other intervertebral disc degeneration, thoracolumbar region: Secondary | ICD-10-CM | POA: Diagnosis not present

## 2023-03-28 DIAGNOSIS — M48062 Spinal stenosis, lumbar region with neurogenic claudication: Secondary | ICD-10-CM | POA: Diagnosis not present

## 2023-03-28 DIAGNOSIS — M4320 Fusion of spine, site unspecified: Secondary | ICD-10-CM | POA: Diagnosis not present

## 2023-03-28 DIAGNOSIS — M47816 Spondylosis without myelopathy or radiculopathy, lumbar region: Secondary | ICD-10-CM | POA: Diagnosis not present

## 2023-03-28 DIAGNOSIS — Z981 Arthrodesis status: Secondary | ICD-10-CM | POA: Diagnosis not present

## 2023-03-28 MED ORDER — PREGABALIN 50 MG PO CAPS: 100.0000 mg | ORAL_CAPSULE | Freq: Three times a day (TID) | ORAL | 5 refills | Status: DC

## 2023-04-17 DIAGNOSIS — M48061 Spinal stenosis, lumbar region without neurogenic claudication: Secondary | ICD-10-CM | POA: Insufficient documentation

## 2023-04-17 DIAGNOSIS — L4052 Psoriatic arthritis mutilans: Secondary | ICD-10-CM | POA: Insufficient documentation

## 2023-04-17 DIAGNOSIS — M5414 Radiculopathy, thoracic region: Secondary | ICD-10-CM | POA: Insufficient documentation

## 2023-04-17 DIAGNOSIS — M549 Dorsalgia, unspecified: Secondary | ICD-10-CM | POA: Diagnosis not present

## 2023-04-17 DIAGNOSIS — M12811 Other specific arthropathies, not elsewhere classified, right shoulder: Secondary | ICD-10-CM | POA: Diagnosis not present

## 2023-04-19 DIAGNOSIS — M47816 Spondylosis without myelopathy or radiculopathy, lumbar region: Secondary | ICD-10-CM | POA: Diagnosis not present

## 2023-04-19 DIAGNOSIS — M48061 Spinal stenosis, lumbar region without neurogenic claudication: Secondary | ICD-10-CM | POA: Diagnosis not present

## 2023-04-19 DIAGNOSIS — M48062 Spinal stenosis, lumbar region with neurogenic claudication: Secondary | ICD-10-CM | POA: Diagnosis not present

## 2023-04-19 DIAGNOSIS — M5116 Intervertebral disc disorders with radiculopathy, lumbar region: Secondary | ICD-10-CM | POA: Diagnosis not present

## 2023-04-19 DIAGNOSIS — M545 Low back pain, unspecified: Secondary | ICD-10-CM | POA: Diagnosis not present

## 2023-04-19 DIAGNOSIS — M5126 Other intervertebral disc displacement, lumbar region: Secondary | ICD-10-CM | POA: Diagnosis not present

## 2023-04-19 DIAGNOSIS — M5136 Other intervertebral disc degeneration, lumbar region: Secondary | ICD-10-CM | POA: Diagnosis not present

## 2023-04-19 DIAGNOSIS — Z9889 Other specified postprocedural states: Secondary | ICD-10-CM | POA: Diagnosis not present

## 2023-04-19 DIAGNOSIS — M4327 Fusion of spine, lumbosacral region: Secondary | ICD-10-CM | POA: Diagnosis not present

## 2023-04-19 DIAGNOSIS — Z981 Arthrodesis status: Secondary | ICD-10-CM | POA: Diagnosis not present

## 2023-04-20 ENCOUNTER — Other Ambulatory Visit: Payer: Self-pay | Admitting: Internal Medicine

## 2023-04-25 ENCOUNTER — Telehealth: Payer: Self-pay | Admitting: Psychiatry

## 2023-04-25 NOTE — Telephone Encounter (Signed)
LF 6/21, due 7/19.

## 2023-04-25 NOTE — Telephone Encounter (Signed)
PT called to request RF of Vyvanse . Will need it to be filled early due to going out of town on vacation. Last filled 6/21. She would like to pick up 20 or 21st.  [facility] [address], [place], [place].

## 2023-04-26 ENCOUNTER — Other Ambulatory Visit: Payer: Self-pay

## 2023-04-26 ENCOUNTER — Telehealth: Payer: Self-pay | Admitting: Internal Medicine

## 2023-04-26 DIAGNOSIS — F900 Attention-deficit hyperactivity disorder, predominantly inattentive type: Secondary | ICD-10-CM

## 2023-04-26 MED ORDER — OXYCODONE-ACETAMINOPHEN 5-325 MG PO TABS: 1.0000 | ORAL_TABLET | Freq: Four times a day (QID) | ORAL | 0 refills | Status: DC | PRN

## 2023-04-26 MED ORDER — LISDEXAMFETAMINE DIMESYLATE 70 MG PO CAPS
70.0000 mg | ORAL_CAPSULE | Freq: Every day | ORAL | 0 refills | Status: DC
Start: 2023-04-27 — End: 2023-05-17

## 2023-04-26 NOTE — Telephone Encounter (Signed)
Pended.

## 2023-04-26 NOTE — Telephone Encounter (Signed)
Spoke with pharmacy and they did receive the refill for the oxycodone

## 2023-04-26 NOTE — Telephone Encounter (Signed)
Confirmed with pharmacist she filled 6/21, should fill 7/19. She had already contacted pharmacy and they are aware. Will pend Rx 7/19.

## 2023-04-26 NOTE — Telephone Encounter (Signed)
Requesting: Oxycodone Contract: Yes UDS: No Last Visit: 02/07/2023 Next Visit: 05/16/2023 Last Refill: 03/26/2023  Please Advise

## 2023-04-26 NOTE — Telephone Encounter (Signed)
Prescription Request  04/26/2023  LOV: 02/07/2023  What is the name of the medication or equipment? oxyCODONE-acetaminophen (PERCOCET/ROXICET) 5-325 MG tablet  Have you contacted your pharmacy to request a refill? Yes   Which pharmacy would you like this sent to?   [id number] [place] [place] - [address] [place] [address] Phone: [phone] Fax: [phone]   Patient notified that their request is being sent to the clinical staff for review and that they should receive a response within 2 business days.   Please advise at [facility] [phone]   Pt stated she gets 120 quantity.

## 2023-04-26 NOTE — Addendum Note (Signed)
Addended by: Sherlene Shams on: 04/26/2023 04:49 PM   Modules accepted: Orders

## 2023-04-26 NOTE — Telephone Encounter (Signed)
"  E Prescribe failed"  message received after the 2nd oxycodone refill was sent.  Please call pharmacy to see if they got the first one.

## 2023-05-15 DIAGNOSIS — M4724 Other spondylosis with radiculopathy, thoracic region: Secondary | ICD-10-CM | POA: Diagnosis not present

## 2023-05-15 DIAGNOSIS — M4805 Spinal stenosis, thoracolumbar region: Secondary | ICD-10-CM | POA: Diagnosis not present

## 2023-05-15 DIAGNOSIS — M545 Low back pain, unspecified: Secondary | ICD-10-CM | POA: Diagnosis not present

## 2023-05-15 DIAGNOSIS — M4854XA Collapsed vertebra, not elsewhere classified, thoracic region, initial encounter for fracture: Secondary | ICD-10-CM | POA: Diagnosis not present

## 2023-05-16 ENCOUNTER — Other Ambulatory Visit: Payer: Self-pay | Admitting: Internal Medicine

## 2023-05-16 ENCOUNTER — Ambulatory Visit (INDEPENDENT_AMBULATORY_CARE_PROVIDER_SITE_OTHER): Payer: Medicare HMO | Admitting: Internal Medicine

## 2023-05-16 ENCOUNTER — Encounter: Payer: Self-pay | Admitting: Internal Medicine

## 2023-05-16 VITALS — BP 142/68 | HR 102 | Ht 66.0 in | Wt 155.2 lb

## 2023-05-16 DIAGNOSIS — T466X5A Adverse effect of antihyperlipidemic and antiarteriosclerotic drugs, initial encounter: Secondary | ICD-10-CM | POA: Diagnosis not present

## 2023-05-16 DIAGNOSIS — M5431 Sciatica, right side: Secondary | ICD-10-CM | POA: Diagnosis not present

## 2023-05-16 DIAGNOSIS — M25532 Pain in left wrist: Secondary | ICD-10-CM | POA: Diagnosis not present

## 2023-05-16 DIAGNOSIS — Z79899 Other long term (current) drug therapy: Secondary | ICD-10-CM

## 2023-05-16 DIAGNOSIS — L4052 Psoriatic arthritis mutilans: Secondary | ICD-10-CM | POA: Diagnosis not present

## 2023-05-16 DIAGNOSIS — G720 Drug-induced myopathy: Secondary | ICD-10-CM | POA: Diagnosis not present

## 2023-05-16 DIAGNOSIS — M2550 Pain in unspecified joint: Secondary | ICD-10-CM

## 2023-05-16 DIAGNOSIS — N3941 Urge incontinence: Secondary | ICD-10-CM

## 2023-05-16 DIAGNOSIS — R1905 Periumbilic swelling, mass or lump: Secondary | ICD-10-CM

## 2023-05-16 DIAGNOSIS — M4316 Spondylolisthesis, lumbar region: Secondary | ICD-10-CM

## 2023-05-16 DIAGNOSIS — M5432 Sciatica, left side: Secondary | ICD-10-CM

## 2023-05-16 DIAGNOSIS — F900 Attention-deficit hyperactivity disorder, predominantly inattentive type: Secondary | ICD-10-CM

## 2023-05-16 MED ORDER — TROSPIUM CHLORIDE 20 MG PO TABS: 20.0000 mg | ORAL_TABLET | Freq: Two times a day (BID) | ORAL | 1 refills | Status: DC

## 2023-05-16 MED ORDER — FUROSEMIDE 20 MG PO TABS: 20.0000 mg | ORAL_TABLET | Freq: Every day | ORAL | 0 refills | Status: DC | PRN

## 2023-05-16 NOTE — Patient Instructions (Addendum)
Try using SalonPas  with 4% lidocaine APPLY AT BEDTIME   Try TENS units  Float the right elbow and stop  using the  antibacterial ointment (too much moisture)   Furosemide once daily for a FEW DAYS  to get rid of the swelling in your left leg   I WILL ORDER A CT OF THE ABDOMEN AND PELVIS TO EVALUATE THE SWELLING

## 2023-05-16 NOTE — Progress Notes (Signed)
Subjective:  Patient ID: Madeline Young, female    DOB: [birth year]  Age: 67 y.o. MRN: [id number]  CC: The primary encounter diagnosis was Acute pain of left wrist. Diagnoses of Other long term (current) drug therapy, Arthralgia, unspecified joint, Spondylolisthesis of lumbar region, Psoriatic arthritis, destructive type (HCC), Bilateral sciatica, Urge incontinence, Attention deficit hyperactivity disorder (ADHD), predominantly inattentive type, and Statin myopathy were also pertinent to this visit.   HPI Madeline Young presents for  Chief Complaint  Patient presents with   Medical Management of Chronic Issues    3 month follow up    1) Pain management: Anjuli has chronic joint pain involving the hands , the neck and the lower back .  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 Her pain  has recently increased due to large herniated disk  at L4 and is is managed with oxycodone and lyrica'; however she has d/c'd lyrica due to side effects including altered mentation and LE swelling (left ankle).   Refill history confirmed via [place] Controlled Substance databas, accessed by me today.Marland Kitchen oxycodone/APAP   5/325  was refilled on July 19  #120 tablets,  and pregabalin 50 mg qty #90 on July 12   2) Arthritis She continues to receive rheumatology  follow up at [facility] with Dr Berton Lan but is frustrated at the progress she is making and the lack of close follow up by the Rheum clinic.  .  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 She has been Taking leflunomide 20 mg daily and prednisone 10 mg daily  and is considering lowering her prednisone dose   3) Spinal stenosis:  history of fusion in 2017 Ll5-s1 She is seeing orthoepdic surgeon  Trula Ore at [facility] for a second opinion on the sciatica.  He  has referred her for [facility] to be done by Dr. [place] James on August 27.    (Her last  ESI was done in May , was very painful,  done by Dr Yetta Barre in [facility] , after a myelogram was done  but did provide transient relief of radiculitis of left upper leg for about 2 months.   . the results of her imaging have been reviewed.  She received a steroid injection  and was advised tby Dr. Yetta Barre and by Dr Lorenda Peck to avoid the only surgery that would be able to be done  because of the complex nature of her anatomy secondary to concurrent scoliosis. She states that the surgery would involve Fusion of "entire spine", not just the lumbar spine. She is using heating pad and voltaren gel.  Has not tried lidocaine patches or TENS .  She currently has bilateral pain radiating to buttocks which is most severe in the morning   Her lower extremity edema has not resolved despite stopping lyrica 2 weeks ago     4) ADD:  She sees Dr Jennelle Human for management of ADD   5) fell onto left wrist one week ago when she fell while standing on the bed .  Larey Seat again while getting  out of the golf cart   wrist turned purple but she was able to move it so she didn' get it x rayed   6) possible umbilical hernia for several months , intermittently painful , belly feels hard,  started on the right and is moving to mid line.  No abd surgical history,  no change in BM's   7) pressure ulcer right elbow   Outpatient Medications Prior to Visit  Medication Sig Dispense Refill   aspirin 81 MG tablet Take 81 mg by mouth daily.     calcium carbonate (OS-CAL - DOSED IN MG OF ELEMENTAL CALCIUM) 1250 (500 Ca) MG tablet      cholecalciferol (VITAMIN D) 1000 UNITS tablet Take 1,000 Units by mouth daily.     clobetasol (TEMOVATE) 0.05 % external solution as needed.     diclofenac Sodium (VOLTAREN) 1 % GEL APPLY 2 GRAMS TO AFFECTED AREA 4 TIMES A DAY 300 g 3   fluocinonide ointment (LIDEX) 0.05 % APPLY TWICE DAILY TO AFFECTED AREAS UNTIL IMPROVED THEN AS NEEDED FOR FLARES     ipratropium (ATROVENT) 0.03 % nasal spray Place into both nostrils.      leflunomide (ARAVA) 20 MG tablet Take 20 mg by mouth daily.     magnesium 30 MG tablet Take 30 mg by mouth daily.     melatonin 5 MG TABS Take 5 mg by mouth at bedtime.     Multiple Vitamin (MULTIVITAMIN) tablet Take 1 tablet by mouth daily.     nystatin-triamcinolone ointment (MYCOLOG) Apply 1 application topically 2 (two) times daily. 30 g 1   Omega-3 Fatty Acids (FISH OIL) 1200 MG CAPS Take 1,200 mg by mouth daily.      oxyCODONE-acetaminophen (PERCOCET/ROXICET) 5-325 MG tablet Take 1 tablet by mouth every 6 (six) hours as needed for moderate pain. Do not refill less than 30 days from prior refill 120 tablet 0   Polyethyl Glycol-Propyl Glycol 0.4-0.3 % SOLN Place 1 drop into both eyes daily as needed (for dry eyes).     predniSONE (DELTASONE) 10 MG tablet Take 10 mg by mouth 2 (two) times daily. Taking 15mg  currently due to flare     Probiotic Product (PROBIOTIC DAILY PO) Take 1 capsule by mouth daily.     triamcinolone cream (KENALOG) 0.1 % APPLY TO AFFECTED AREA TWICE A DAY UNTIL CLEAR THEN AS NEEDED     Turmeric 500 MG TABS Take 1 tablet by mouth daily.     celecoxib (CELEBREX) 200 MG capsule Take 1 capsule (200 mg total) by mouth 2 (two) times daily. 180 capsule 1   FLUoxetine (PROZAC) 20 MG capsule TAKE 1 CAPSULE BY MOUTH EVERY DAY 90 capsule 1   lisdexamfetamine (VYVANSE) 70 MG capsule Take 1 capsule (70 mg total) by mouth daily. 30 capsule 0   lisdexamfetamine (VYVANSE) 70 MG capsule Take 1 capsule (70 mg total) by mouth daily. 30 capsule 0   lisdexamfetamine (VYVANSE) 70 MG capsule Take 1 capsule (70 mg total) by mouth daily. 30 capsule 0   trospium (SANCTURA) 20 MG tablet TAKE 1 TABLET BY MOUTH TWICE A DAY 180 tablet 1   doxycycline (VIBRA-TABS) 100 MG tablet Take 1 tablet (100 mg total) by mouth 2 (two) times daily. (Patient not taking: Reported on 05/16/2023) 14 tablet 0   pregabalin (LYRICA) 50 MG capsule Take 1 capsule (50 mg total) by mouth 5 (five) times daily. NOTE DOSE  INCREASE TO 5 DAILY (Patient not taking: Reported on 05/16/2023) 180 capsule 2   No facility-administered medications prior to visit.    Review of Systems;  Patient denies headache, fevers, malaise, unintentional weight loss, skin rash, eye pain, sinus congestion and sinus pain, sore throat, dysphagia,  hemoptysis , cough, dyspnea, wheezing, chest pain, palpitations, orthopnea, edema, abdominal pain, nausea, melena, diarrhea, constipation, flank pain, dysuria, hematuria, urinary  Frequency, nocturia, numbness, tingling, seizures,  Focal weakness, Loss of consciousness,  Tremor, insomnia, depression, anxiety, and suicidal ideation.      Objective:  BP (!) 142/68   Pulse (!) 102   Ht 5\' 6"  (1.676 m)   Wt 155 lb 3.2 oz (70.4 kg)   SpO2 95%   BMI 25.05 kg/m   BP Readings from Last 3 Encounters:  05/16/23 (!) 142/68  02/15/23 (!) 157/72  02/07/23 (!) 142/60    Wt Readings from Last 3 Encounters:  05/16/23 155 lb 3.2 oz (70.4 kg)  02/07/23 160 lb 6.4 oz (72.8 kg)  11/09/22 161 lb 3.2 oz (73.1 kg)    Physical Exam Vitals reviewed.  Constitutional:      General: She is not in acute distress.    Appearance: Normal appearance. She is normal weight. She is not ill-appearing, toxic-appearing or diaphoretic.  HENT:     Head: Normocephalic.  Eyes:     General: No scleral icterus.       Right eye: No discharge.        Left eye: No discharge.     Conjunctiva/sclera: Conjunctivae normal.  Cardiovascular:     Rate and Rhythm: Normal rate and regular rhythm.     Heart sounds: Normal heart sounds.  Pulmonary:     Effort: Pulmonary effort is normal. No respiratory distress.     Breath sounds: Normal breath sounds.  Musculoskeletal:        General: Swelling and deformity present.     Left lower leg: Edema present.     Comments: Dextroscoliosis of thoracolumbar spine noted Positive straight leg lift bilaterally   Skin:    General: Skin is warm and dry.  Neurological:     General: No  focal deficit present.     Mental Status: She is alert and oriented to person, place, and time. Mental status is at baseline.  Psychiatric:        Mood and Affect: Mood normal.        Behavior: Behavior normal.        Thought Content: Thought content normal.        Judgment: Judgment normal.    Lab Results  Component Value Date   HGBA1C 6.1 01/31/2022    Lab Results  Component Value Date   CREATININE 0.75 05/16/2023   CREATININE 0.83 11/09/2022   CREATININE 0.72 11/09/2021    Lab Results  Component Value Date   WBC 6.6 05/16/2023   HGB 11.3 (L) 05/16/2023   HCT 34.7 (L) 05/16/2023   PLT 326.0 05/16/2023   GLUCOSE 110 (H) 05/16/2023   CHOL 365 (H) 11/09/2022   TRIG 81.0 11/09/2022   HDL 71.40 11/09/2022   LDLDIRECT 256.0 11/09/2022   LDLCALC 277 (H) 11/09/2022   ALT 22 05/16/2023   AST 21 05/16/2023   NA 139 05/16/2023   K 4.4 05/16/2023   CL 101 05/16/2023   CREATININE 0.75 05/16/2023   BUN 23 05/16/2023   CO2 29 05/16/2023   TSH 0.96 01/31/2022   INR 1.0 02/07/2023   HGBA1C 6.1 01/31/2022   MICROALBUR 1.7 01/31/2022    DG MYELOGRAPHY LUMBAR INJ LUMBOSACRAL  Result Date: 02/15/2023 CLINICAL DATA:  Chronic, progressively worsening low back pain, worse on the right. History of prior fusion in 2017. EXAM: LUMBAR MYELOGRAM CT LUMBAR MYELOGRAM FLUOROSCOPY: Radiation Exposure Index (as provided by the fluoroscopic device): 19.3 mGy Kerma PROCEDURE: After thorough discussion of risks and benefits of the procedure including bleeding, infection, injury to nerves,  blood vessels, adjacent structures as well as headache and CSF leak, written and oral informed consent was obtained. Consent was obtained by Dr. Malachi Pro. Time out form was completed. Patient was positioned prone on the fluoroscopy table. Local anesthesia was provided with 1% lidocaine without epinephrine after prepped and draped in the usual sterile fashion. Puncture was performed at L1-L2 using a 3 1/2 inch  22-gauge spinal needle via right interlaminar approach. Using a single pass through the dura, the needle was placed within the thecal sac, with return of clear CSF. 15 mL of Isovue M-200 was injected into the thecal sac, with normal opacification of the nerve roots and cauda equina consistent with free flow within the subarachnoid space. I personally performed the lumbar puncture and administered the intrathecal contrast. I also personally supervised acquisition of the myelogram images. TECHNIQUE: Contiguous axial images were obtained through the lumbar spine after the intrathecal infusion of contrast. Coronal and sagittal reconstructions were obtained of the axial image sets. COMPARISON:  Lumbar spine x-rays dated August 13, 2018. CT lumbar myelogram dated July 18, 2017. FINDINGS: LUMBAR MYELOGRAM FINDINGS: Prior L4-S1 PLIF. Hardware is intact. Progressive dextroscoliosis. Chronic fused mild anterolisthesis at L4-L5 and L5-S1. No dynamic instability. Small posterior disc osteophyte complexes from L1-L2 through L3-L4. Mild spinal canal stenosis at L1-L2 and L2-L3. Mild-to-moderate spinal canal stenosis at L3-L4. CT LUMBAR MYELOGRAM FINDINGS: Segmentation: Standard. Alignment: Progressive dextroscoliosis. Unchanged mild fused anterolisthesis at L4-L5 and L5-S1. Vertebrae: Acute nondisplaced fracture of the right L1 transverse process. Chronic mild T12 inferior endplate compression deformity, new since 2018. Prior L4-S1 PLIF with interval solid interbody and posterior osseous fusion. Unchanged mild lucency surrounding the bilateral S1 pedicle screws. Conus medullaris and cauda equina: Conus extends to the L1-L2 level. Conus and cauda equina appear normal. Paraspinal and other soft tissues: Aortoiliac atherosclerotic vascular disease. Disc levels: T11-T12: Mild disc bulging and bilateral facet arthropathy. Mild right neuroforaminal stenosis. No spinal canal or left neuroforaminal stenosis. T12-L1: Progressive  diffuse disc bulging eccentric to the right. Large right subarticular disc extrusion/extruded disc fragment migrating inferiorly posterior to the L1 vertebral body. Smaller left subarticular extruded disc material with vacuum phenomenon posterior to the L1 vertebral body, potentially arising from the T12-L1 or L1-L2 disc. Large right far lateral disc extrusion migrating superiorly. Mild bilateral facet arthropathy. Mild-to-moderate spinal canal and right lateral recess stenosis. Mild left lateral recess stenosis. Mild right neuroforaminal stenosis. No left neuroforaminal stenosis. Findings have progressed since 2018. L1-L2: Small circumferential disc osteophyte complex moderate left and mild right facet arthropathy. Mild-to-moderate left lateral recess stenosis. Mild bilateral neuroforaminal stenosis. No spinal canal stenosis. Findings have progressed since 2018. L2-L3: Mild disc bulging and endplate spurring eccentric to the left. Small amount of air in the left inferior subarticular base posterior to the L3 vertebral body, presumably within extruded disc. Moderate to severe left and mild right facet arthropathy. Mild left lateral recess and neuroforaminal stenosis. No spinal canal or right neuroforaminal stenosis. Findings have progressed since 2018. L3-L4: Circumferential disc osteophyte complex with severe right and moderate to severe left facet arthropathy. Moderate spinal canal stenosis. Mild to moderate right and mild left lateral recess stenosis. Moderate left and mild right neuroforaminal stenosis. Findings have progressed since 2018. L4-L5: Prior posterior decompression and PLIF. No residual stenosis. L5-S1: Prior posterior decompression and PLIF. No residual stenosis. IMPRESSION: 1. Prior L4-S1 PLIF with interval solid interbody and posterior osseous fusion. Unchanged mild lucency surrounding the bilateral S1 pedicle screws, concerning for loosening. No residual stenosis. 2.  Progressive multilevel lumbar  spondylosis as described above. Mild-to-moderate spinal canal and right lateral recess stenosis at T12-L1. Moderate spinal canal and left neuroforaminal stenosis at L3-L4. 3. Acute nondisplaced fracture of the right L1 transverse process. 4. Chronic mild T12 inferior endplate compression deformity, new since 2018. 5.  Aortic Atherosclerosis (ICD10-I70.0). Electronically Signed   By: Obie Dredge M.D.   On: 02/15/2023 14:26   CT LUMBAR SPINE W CONTRAST  Result Date: 02/15/2023 CLINICAL DATA:  Chronic, progressively worsening low back pain, worse on the right. History of prior fusion in 2017. EXAM: LUMBAR MYELOGRAM CT LUMBAR MYELOGRAM FLUOROSCOPY: Radiation Exposure Index (as provided by the fluoroscopic device): 19.3 mGy Kerma PROCEDURE: After thorough discussion of risks and benefits of the procedure including bleeding, infection, injury to nerves, blood vessels, adjacent structures as well as headache and CSF leak, written and oral informed consent was obtained. Consent was obtained by Dr. Malachi Pro. Time out form was completed. Patient was positioned prone on the fluoroscopy table. Local anesthesia was provided with 1% lidocaine without epinephrine after prepped and draped in the usual sterile fashion. Puncture was performed at L1-L2 using a 3 1/2 inch 22-gauge spinal needle via right interlaminar approach. Using a single pass through the dura, the needle was placed within the thecal sac, with return of clear CSF. 15 mL of Isovue M-200 was injected into the thecal sac, with normal opacification of the nerve roots and cauda equina consistent with free flow within the subarachnoid space. I personally performed the lumbar puncture and administered the intrathecal contrast. I also personally supervised acquisition of the myelogram images. TECHNIQUE: Contiguous axial images were obtained through the lumbar spine after the intrathecal infusion of contrast. Coronal and sagittal reconstructions were obtained of  the axial image sets. COMPARISON:  Lumbar spine x-rays dated August 13, 2018. CT lumbar myelogram dated July 18, 2017. FINDINGS: LUMBAR MYELOGRAM FINDINGS: Prior L4-S1 PLIF. Hardware is intact. Progressive dextroscoliosis. Chronic fused mild anterolisthesis at L4-L5 and L5-S1. No dynamic instability. Small posterior disc osteophyte complexes from L1-L2 through L3-L4. Mild spinal canal stenosis at L1-L2 and L2-L3. Mild-to-moderate spinal canal stenosis at L3-L4. CT LUMBAR MYELOGRAM FINDINGS: Segmentation: Standard. Alignment: Progressive dextroscoliosis. Unchanged mild fused anterolisthesis at L4-L5 and L5-S1. Vertebrae: Acute nondisplaced fracture of the right L1 transverse process. Chronic mild T12 inferior endplate compression deformity, new since 2018. Prior L4-S1 PLIF with interval solid interbody and posterior osseous fusion. Unchanged mild lucency surrounding the bilateral S1 pedicle screws. Conus medullaris and cauda equina: Conus extends to the L1-L2 level. Conus and cauda equina appear normal. Paraspinal and other soft tissues: Aortoiliac atherosclerotic vascular disease. Disc levels: T11-T12: Mild disc bulging and bilateral facet arthropathy. Mild right neuroforaminal stenosis. No spinal canal or left neuroforaminal stenosis. T12-L1: Progressive diffuse disc bulging eccentric to the right. Large right subarticular disc extrusion/extruded disc fragment migrating inferiorly posterior to the L1 vertebral body. Smaller left subarticular extruded disc material with vacuum phenomenon posterior to the L1 vertebral body, potentially arising from the T12-L1 or L1-L2 disc. Large right far lateral disc extrusion migrating superiorly. Mild bilateral facet arthropathy. Mild-to-moderate spinal canal and right lateral recess stenosis. Mild left lateral recess stenosis. Mild right neuroforaminal stenosis. No left neuroforaminal stenosis. Findings have progressed since 2018. L1-L2: Small circumferential disc osteophyte  complex moderate left and mild right facet arthropathy. Mild-to-moderate left lateral recess stenosis. Mild bilateral neuroforaminal stenosis. No spinal canal stenosis. Findings have progressed since 2018. L2-L3: Mild disc bulging and endplate spurring eccentric to the left. Small amount of  air in the left inferior subarticular base posterior to the L3 vertebral body, presumably within extruded disc. Moderate to severe left and mild right facet arthropathy. Mild left lateral recess and neuroforaminal stenosis. No spinal canal or right neuroforaminal stenosis. Findings have progressed since 2018. L3-L4: Circumferential disc osteophyte complex with severe right and moderate to severe left facet arthropathy. Moderate spinal canal stenosis. Mild to moderate right and mild left lateral recess stenosis. Moderate left and mild right neuroforaminal stenosis. Findings have progressed since 2018. L4-L5: Prior posterior decompression and PLIF. No residual stenosis. L5-S1: Prior posterior decompression and PLIF. No residual stenosis. IMPRESSION: 1. Prior L4-S1 PLIF with interval solid interbody and posterior osseous fusion. Unchanged mild lucency surrounding the bilateral S1 pedicle screws, concerning for loosening. No residual stenosis. 2. Progressive multilevel lumbar spondylosis as described above. Mild-to-moderate spinal canal and right lateral recess stenosis at T12-L1. Moderate spinal canal and left neuroforaminal stenosis at L3-L4. 3. Acute nondisplaced fracture of the right L1 transverse process. 4. Chronic mild T12 inferior endplate compression deformity, new since 2018. 5.  Aortic Atherosclerosis (ICD10-I70.0). Electronically Signed   By: Obie Dredge M.D.   On: 02/15/2023 14:26    Assessment & Plan:  .Acute pain of left wrist -     DG Wrist Complete Left; Future  Other long term (current) drug therapy -     Comprehensive metabolic panel -     CBC with Differential/Platelet  Arthralgia, unspecified  joint -     Sedimentation rate -     C-reactive protein  Spondylolisthesis of lumbar region Assessment & Plan: With persistent low back pain and right sided sciatica despite prior surgery.  Her pain is not relieved with NSAIDs or recent ESI.   Management of her pain requires  refill on narcotics for current use 4 times daily . Marland Kitchen Refill history confirmed via [place] Controlled Substance database, accessed by me today. And refills on oxycodone prescribed for 3 months .  Encouraged to add TENS unit . Lyrica was stopped due to intolerable side effects of altered concentration and LE edema.     Psoriatic arthritis, destructive type [facility]) Assessment & Plan: At last visit her inflammatory markers were normal (ESR and CRP).  Repeat markers are low. Will encourage reduction in prednisone dose to 5 mg daily .  She is taking celebrex as well   Lab Results  Component Value Date   ESRSEDRATE -8 (L) 05/16/2023   Lab Results  Component Value Date   CRP <1.0 05/16/2023      Bilateral sciatica Assessment & Plan: She has  persistent low back pain and right sided sciatica despite prior surgery due to spondlyosis.  Her pain is not relieved with NSAIDs , prednisone nor recent ESI. .   Management of her pain requires  monthly refill on narcotics for  four  times daily   use . Marland Kitchen Refill history confirmed via [place] Controlled Substance database, accessed by me today.. refills given for 3 months .     Urge incontinence Assessment & Plan: Using trospium  every 12 hours.    Attention deficit hyperactivity disorder (ADHD), predominantly inattentive type Assessment & Plan: Stable ,managed for years by Dr. Jennelle Human with modafanil and cymbalta for anxiety .    Statin myopathy Assessment & Plan: Symptoms recurred even with  trial of Repatha    Other orders -     Trospium Chloride; Take 1 tablet (20 mg total) by mouth 2 (two) times daily.  Dispense: 180 tablet; Refill: 1 -  Furosemide; Take 1 tablet (20 mg  total) by mouth daily as needed.  Dispense: 30 tablet; Refill: 0     I provided  43 minutes on the day of this face-to-face  encounter reviewing patient's last visit with me, patient's  most recent visit with Orthopedics, Rheumatology and psychiatry,  prior  surgical and non surgical procedures, previous  labs and imaging studies, counseling on currently addressed issues,  and post visit ordering to diagnostics and therapeutics .    Follow-up: Return in about 3 months (around 08/16/2023) for chronic pain management.   Sherlene Shams, MD

## 2023-05-17 ENCOUNTER — Encounter: Payer: Self-pay | Admitting: Psychiatry

## 2023-05-17 ENCOUNTER — Telehealth (INDEPENDENT_AMBULATORY_CARE_PROVIDER_SITE_OTHER): Payer: Medicare HMO | Admitting: Psychiatry

## 2023-05-17 DIAGNOSIS — F5105 Insomnia due to other mental disorder: Secondary | ICD-10-CM | POA: Diagnosis not present

## 2023-05-17 DIAGNOSIS — M549 Dorsalgia, unspecified: Secondary | ICD-10-CM

## 2023-05-17 DIAGNOSIS — F3341 Major depressive disorder, recurrent, in partial remission: Secondary | ICD-10-CM

## 2023-05-17 DIAGNOSIS — G8929 Other chronic pain: Secondary | ICD-10-CM | POA: Diagnosis not present

## 2023-05-17 DIAGNOSIS — F900 Attention-deficit hyperactivity disorder, predominantly inattentive type: Secondary | ICD-10-CM | POA: Diagnosis not present

## 2023-05-17 MED ORDER — MODAFINIL 200 MG PO TABS
ORAL_TABLET | ORAL | 1 refills | Status: DC
Start: 2023-05-17 — End: 2023-07-24

## 2023-05-17 MED ORDER — DULOXETINE HCL 30 MG PO CPEP
ORAL_CAPSULE | ORAL | 0 refills | Status: DC
Start: 2023-05-17 — End: 2023-06-10

## 2023-05-17 NOTE — Progress Notes (Signed)
Madeline Young [id number] 1956/06/01 67 y.o.  Video Visit via My Chart  I connected with pt by video using My Chart and verified that I am speaking with the correct person using two identifiers.   I discussed the limitations, risks, security and privacy concerns of performing an evaluation and management service by My Chart  and the availability of in person appointments. I also discussed with the patient that there may be a patient responsible charge related to this service. The patient expressed understanding and agreed to proceed.  I discussed the assessment and treatment plan with the patient. The patient was provided an opportunity to ask questions and all were answered. The patient agreed with the plan and demonstrated an understanding of the instructions.   The patient was advised to call back or seek an in-person evaluation if the symptoms worsen or if the condition fails to improve as anticipated.  I provided 45 minutes of video time during this encounter.  The patient was located at home and the provider was located office. Session 200-245  Subjective:   Patient ID:  Madeline Young is a 67 y.o. (DOB 11-05-1955) female.  Chief Complaint:  Chief Complaint  Patient presents with   Follow-up   Depression   Anxiety   ADD   Sleeping Problem   Stress    Severe ortho px    Depression        Associated symptoms include decreased concentration.  Associated symptoms include no suicidal ideas.  Madeline Young presents to the office today for follow-up of ADD and anxiety and depression and sleep.  seen August 2020.  No meds were changed.  On Vyvanse 50, fluoxetine 20, and Ambien 5.  seen November 30, 2019.  The following was noted: "Down".  Doesn't feel Vyvanse helping as much.  More scattered and doesn't finish things.  No SE with it. Sleep pattern is worse as Covid progressed.  Had reduced Zolpidem to 5 mg but then increased to 7.5 mg HS.  If takes Benadryl but  can sleep but then has crying spells after it and hangover.  In the AM feels more negative and tearful.  AM is hard bc stiff in the morning. Plan:Continue Vyvanse 50 mg AM Increase fluoxetine to 30 mg daily.  She doesn't want to go to 40 bc fear of serotonin syndrome.  Thinks she had some of those sx in the past Continue zolpidem 5 mg daily.  01/29/20 appt, reported:  Better with increased fluoxetine re: mood. No SE with meds. Still ADD problems not as well controlled as she'd like. Still problems with sleep and doesn't think ambien workiing as well as it should.  Tried trazodone with Ambien but had hangover and difficulty waking up and not a deep good sleep.  Never tried trazodone alone.  Increased Ambien on her own to 10 mg and ran out early.  Reduced to 5 mg and added Benadryl.  Erratic sleep since Covid.  Go to bed same time 10.  Avoids alcohol 2 hours before. Primary problem is going to sleep.  History of 10 mg Ambien for years.  Says 7.5 mg Ambien will work. Caffeine none after noon. Plan: Increase Vyvanse 60 mg AM continue fluoxetine to 30 mg daily.  She doesn't want to go to 40 bc fear of serotonin syndrome.  Thinks she had some of those sx in the past.  It helped to increase Increase zolpidem 5-10 mg daily.  08/02/2020 appointment with the following noted: Went back  down to 20 mg fluoxetine bc of more HA and fear of serotonin syndrome bc she feels she had it at some point in the past.  Taking 20 mg for mos. Still problems with sleep.  Down to 2.5 mg Ambien bc when takes more feels out of it the next day.   Thinks taking Ambien too long. Too much awakening. No marked benefit with increased Vyvanse to 60 mg and feels more irritable and edgy. However still distractible and inefficient and hard to finish things. Forgetful and loses things. Plan: Reduce Vyvanse to 50 mg AM continue fluoxetine to 20 mg daily DT her fear of serotonin syndrome.  Thinks she had some of those sx in the past.  May  have reduced benefit DT lower dose. Trial Belsomra 15-20 mg or Dayvigo for sleep instead of Ambien bc hangover with 5 mg daily.  11/01/2020 appointment with the following noted: Phone call 08/16/2020:Rtc to patient and she did try the Belsomra and reports having the "hang over and drowsy" effect. Same as the trazodone.  She is asking to try Lunesta instead.  10/07/2020 she called back again stating she is wanted to go back to Ambien 5 mg. Ambien seems to work again and getting 8 hours now. Re: reduction vyvanse to 50 mg.  Now feels that it's now working well.  Very distractible and not finishing things.  Not affordable to take the biologicals any more and had to stop but pain is not worse. Plan increase Vyvanse to 60 mg daily. For sleep return to Ambien 5 mg nightly because of failures of alternatives.  03/08/2021 appointment with the following noted: Doing good overall.   Taking 7.5 mg Ambien.  Need to sleep and satisfied with it now. Overall in a good place emotionally and mentally.  Satisfied with meds. No SE now. Tolerated Vyvanse 60 fine and pleased with it.   Prednisone back a couple of weeks ago.  Pain can awaken her also. Pleased to sell property. Plan: no med changes  09/07/2021 appointment with the following noted: Worst couple of mos.   July flair up of arthritis, the worst ever related to stress, the heat and over exertion. Took 5 mos to see new rheumatologist and sees them tomorrow. Got more down with pain. On prednisone it helped.  On 20 mg prednisone had irritability and ADD worse and insomnia.  Forgetful, distractible to marked degree.  Trouble making decisions.  Rewriting lists of things to do wasting time.  Better cognition with less prednisone.  Would rather hurt than be like this. Almost nonfunctional at this time.  Always doing things.   Having BA remodeled.  Sleep 6-7 hours. Plan: Yes trial Namenda off label. continue fluoxetine to 20 mg daily DT her fear of  serotonin syndrome.  Thinks she had some of those sx in the past.  May have reduced benefit DT lower dose. Per her request to increase Vyvanse to 70 mg every morning for better benefit for cognition.  11/10/2021 appointment with the following noted: Not on Namenda at this time. New rheum dx CPPD and wondering if she had psoriatic arthritis and wanted to try new drugs. Lives in historic house with a lot of stairs.  At [facility] with more problems with joints.  Worry over new doctor and new dx with FU MRI 2 weeks ago.  Worst flare up ever and took her down mentally.  Pain interfered with sleep.  Missed 2 mos of hair appts.  Couldn't function DT pain.Need to restart  prednisone and get injections from Dr. Clydene Pugh yesterday.  Prednisone helped. Thought Namenda helped some but caused dizziness.  Stopped it. Had gotten down to 1/4 Ambien and melatonin but prednisone increased insomnia. Back on 5 mg Ambien. Inflammatory flareup if overworks. Got mild Covid and H Covid with no sx. Health stress and health care issues. Still all over the place. Plan no med chages  05/15/2022 appointment noted: Taking Vyvanse 60 bc can't get 70's Overall doing well lately.  [place] and did well. GD born early at beach and she went to help them.  Born at 34 weeks and GI issues.   D from [place] came to visit for a couple of weeks.   RA flare up.  Ongoing problem. Mood is ok.    11/16/22 appt noted: Ongoing med px. No med changes Vyvanse 70 AM, fluoxetine 20 mg daily Renovation triggering bursitis affecting sleep DT pain and psoriasis flares.  Going to [facility] Arthritis is severe and problematic.   Stress medical bills. Not markedly depressed but down over medical problems with severe arthritis. Some memory issues.  Not severe.   Still starts things she doesn't finish. No SE. D last baby born premature.   Plan no changes  05/17/23 appt noted:  also disc with D Anna Meds as above No SE A lot has  happened.  Infusions for psoriatic arthritis.  Noticed more joint and muscle pain but tried 3 and got worse.  CBP got worse.  Some sciatica.    Hx fusion 2017.   Got so bad had to use walker. Dx scoliosis and severe disc bulging.  Was told not a surgical candidate by prior surgeon.  2nd opinion in [facility] said same thing.  In constant pain and cry all the time.   Asks about switch to modafinil. D noticed before pain meds, still spacey, disorganized, irritable.  Wonders vyvanse is causing some of these SE.     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  Helping to care for 57-month-old [place] in [place].  Past Psychiatric Medication Trials:  trazodone hangover, Ambien, belsomra, Dayvigo to expensive, melatonin, Benadryl hangover and combos (Lunesta never tried) Xanax hangover Fluoxetine 60, sertraline NR, duloxetine, Lexapro, Wellbutrin, buspirone,  Ritalin LA, Concerta 54, Adderall 20 dizzy, Vyvanse 60 Lyrica sedation  Under care at this office since July 2002   Review of Systems:  Review of Systems  Cardiovascular:  Negative for palpitations.  Musculoskeletal:  Positive for arthralgias, back pain, gait problem and joint swelling.  Skin:  Positive for rash.  Neurological:  Negative for dizziness and tremors.  Psychiatric/Behavioral:  Positive for decreased concentration and sleep disturbance. Negative for agitation, behavioral problems, confusion, dysphoric mood, hallucinations, self-injury and suicidal ideas. The patient is not nervous/anxious and is not hyperactive.     Medications: I have reviewed the patient's current medications.  Current Outpatient Medications  Medication Sig Dispense Refill   aspirin 81 MG tablet Take 81 mg by mouth daily.     calcium carbonate (OS-CAL - DOSED IN MG OF ELEMENTAL CALCIUM) 1250 (500 Ca) MG tablet      celecoxib (CELEBREX) 200 MG capsule Take 1 capsule (200 mg total) by mouth 2 (two) times daily. 180 capsule 1    cholecalciferol (VITAMIN D) 1000 UNITS tablet Take 1,000 Units by mouth daily.     clobetasol (TEMOVATE) 0.05 % external solution as needed.     diclofenac Sodium (VOLTAREN) 1 % GEL APPLY  2 GRAMS TO AFFECTED AREA 4 TIMES A DAY 300 g 3   fluocinonide ointment (LIDEX) 0.05 % APPLY TWICE DAILY TO AFFECTED AREAS UNTIL IMPROVED THEN AS NEEDED FOR FLARES     FLUoxetine (PROZAC) 20 MG capsule TAKE 1 CAPSULE BY MOUTH EVERY DAY 90 capsule 1   furosemide (LASIX) 20 MG tablet Take 1 tablet (20 mg total) by mouth daily as needed. 30 tablet 0   ipratropium (ATROVENT) 0.03 % nasal spray Place into both nostrils.     leflunomide (ARAVA) 20 MG tablet Take 20 mg by mouth daily.     lisdexamfetamine (VYVANSE) 70 MG capsule Take 1 capsule (70 mg total) by mouth daily. 30 capsule 0   lisdexamfetamine (VYVANSE) 70 MG capsule Take 1 capsule (70 mg total) by mouth daily. 30 capsule 0   lisdexamfetamine (VYVANSE) 70 MG capsule Take 1 capsule (70 mg total) by mouth daily. 30 capsule 0   magnesium 30 MG tablet Take 30 mg by mouth daily.     melatonin 5 MG TABS Take 5 mg by mouth at bedtime.     Multiple Vitamin (MULTIVITAMIN) tablet Take 1 tablet by mouth daily.     nystatin-triamcinolone ointment (MYCOLOG) Apply 1 application topically 2 (two) times daily. 30 g 1   Omega-3 Fatty Acids (FISH OIL) 1200 MG CAPS Take 1,200 mg by mouth daily.      oxyCODONE-acetaminophen (PERCOCET/ROXICET) 5-325 MG tablet Take 1 tablet by mouth every 6 (six) hours as needed for moderate pain. Do not refill less than 30 days from prior refill 120 tablet 0   Polyethyl Glycol-Propyl Glycol 0.4-0.3 % SOLN Place 1 drop into both eyes daily as needed (for dry eyes).     predniSONE (DELTASONE) 10 MG tablet Take 10 mg by mouth 2 (two) times daily. Taking 15mg  currently due to flare     Probiotic Product (PROBIOTIC DAILY PO) Take 1 capsule by mouth daily.     triamcinolone cream (KENALOG) 0.1 % APPLY TO AFFECTED AREA TWICE A DAY UNTIL CLEAR THEN AS  NEEDED     trospium (SANCTURA) 20 MG tablet Take 1 tablet (20 mg total) by mouth 2 (two) times daily. 180 tablet 1   Turmeric 500 MG TABS Take 1 tablet by mouth daily.     No current facility-administered medications for this visit.    Medication Side Effects: None  Allergies:  Allergies  Allergen Reactions   Cosentyx [Secukinumab] Rash    Severe case of psorasis   Sertraline Hcl Other (See Comments)    Made me crazy    Adalimumab     Other Reaction(s): psoriasis   Avelox [Moxifloxacin Hcl In Nacl]     Pt cant remember reaction   Nitrofurantoin     Other reaction(s): Other (See Comments) tired Pt cant remember reaction Other reaction(s): Unknown   Pregabalin     Cognitive issues   Rosuvastatin     Other reaction(s): Unknown   Latex Itching and Rash    Redness   Macrobid [Nitrofurantoin Monohyd Macro] Other (See Comments)    tired   Statins Other (See Comments)    Muscle aches    Past Medical History:  Diagnosis Date   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   ADHD (attention deficit hyperactivity disorder)    Allergy    Anxiety    Arthritis    Cataract    bil cateracts removed   Complication of anesthesia    first colonoscopy pt states she woke up   Depression  GERD (gastroesophageal reflux disease)    Globus sensation    Hyperlipidemia    Incontinence    bowels at times   Insomnia    Multinodular goiter (nontoxic)    Psoriatic arthritis (HCC)    Spinal stenosis of lumbar region 03/10/2015   MRI    Statin intolerance 01/27/2013    Family History  Problem Relation Age of Onset   Heart disease Mother    Hyperlipidemia Mother    Coronary artery disease Father 10       CABG in early 47's   Hyperlipidemia Father    Epilepsy Grandchild        Severe form   Colon cancer Neg Hx    Esophageal cancer Neg Hx    Rectal cancer Neg Hx    Stomach cancer Neg Hx    Colon polyps Neg Hx    Crohn's disease Neg Hx     Social History   Socioeconomic History    Marital status: Married    Spouse name: Not on file   Number of children: 2   Years of education: Not on file   Highest education level: Not on file  Occupational History   Occupation: disability  Tobacco Use   Smoking status: Former    Current packs/day: 0.00    Types: Cigarettes    Quit date: 08/21/1971    Years since quitting: 51.7    Passive exposure: Never   Smokeless tobacco: Never  Vaping Use   Vaping status: Never Used  Substance and Sexual Activity   Alcohol use: Yes    Alcohol/week: 7.0 standard drinks of alcohol    Types: 7 Glasses of wine per week    Comment: daily,wine   Drug use: No   Sexual activity: Not on file  Other Topics Concern   Not on file  Social History Narrative   Married, retired Charity fundraiser   2 daughters some grandchildren   1 caffeinated drinks daily   1 alcoholic beverage daily   No tobacco      Social Determinants of Health   Financial Resource Strain: Low Risk  (08/17/2022)   Overall Financial Resource Strain (CARDIA)    Difficulty of Paying Living Expenses: Not hard at all  Food Insecurity: No Food Insecurity (08/17/2022)   Hunger Vital Sign    Worried About Running Out of Food in the Last Year: Never true    Ran Out of Food in the Last Year: Never true  Transportation Needs: No Transportation Needs (08/17/2022)   PRAPARE - Administrator, Civil Service (Medical): No    Lack of Transportation (Non-Medical): No  Physical Activity: Insufficiently Active (08/17/2022)   Exercise Vital Sign    Days of Exercise per Week: 7 days    Minutes of Exercise per Session: 20 min  Stress: No Stress Concern Present (08/17/2022)   [employer] of Occupational Health - Occupational Stress Questionnaire    Feeling of Stress : Not at all  Social Connections: Unknown (08/17/2022)   Social Connection and Isolation Panel [NHANES]    Frequency of Communication with Friends and Family: Not on file    Frequency of Social Gatherings with Friends and  Family: Not on file    Attends Religious Services: Not on file    Active Member of Clubs or Organizations: Not on file    Attends Banker Meetings: Not on file    Marital Status: Married  Intimate Partner Violence: Not At Risk (08/17/2022)   Humiliation, Afraid,  Rape, and Kick questionnaire    Fear of Current or Ex-Partner: No    Emotionally Abused: No    Physically Abused: No    Sexually Abused: No    Past Medical History, Surgical history, Social history, and Family history were reviewed and updated as appropriate.   Please see review of systems for further details on the patient's review from today.   Objective:   Physical Exam:  There were no vitals taken for this visit.  Physical Exam Constitutional:      General: She is not in acute distress.    Appearance: Normal appearance. She is well-developed.  Musculoskeletal:        General: No deformity.  Neurological:     Mental Status: She is alert and oriented to person, place, and time.     Motor: No tremor.     Coordination: Coordination normal.     Gait: Gait normal.  Psychiatric:        Attention and Perception: Perception normal. She is inattentive.        Mood and Affect: Mood is anxious and depressed. Affect is not labile, angry or inappropriate.        Speech: Speech normal.        Behavior: Behavior normal.        Thought Content: Thought content normal. Thought content is not delusional. Thought content does not include homicidal or suicidal ideation. Thought content does not include suicidal plan.        Cognition and Memory: Cognition normal.        Judgment: Judgment normal.     Comments: Insight intact. No auditory or visual hallucinations. No delusions.  Talkative per usual. Mood more down Some chronic ADD     Lab Review:     Component Value Date/Time   NA 139 05/16/2023 1434   K 4.4 05/16/2023 1434   CL 101 05/16/2023 1434   CO2 29 05/16/2023 1434   GLUCOSE 110 (H) 05/16/2023 1434    BUN 23 05/16/2023 1434   CREATININE 0.75 05/16/2023 1434   CALCIUM 9.9 05/16/2023 1434   PROT 6.5 05/16/2023 1434   ALBUMIN 4.2 05/16/2023 1434   AST 21 05/16/2023 1434   ALT 22 05/16/2023 1434   ALKPHOS 45 05/16/2023 1434   BILITOT 0.6 05/16/2023 1434   GFRNONAA >60 02/18/2016 0322   GFRAA >60 02/18/2016 0322       Component Value Date/Time   WBC 6.6 05/16/2023 1434   RBC 3.47 (L) 05/16/2023 1434   HGB 11.3 (L) 05/16/2023 1434   HCT 34.7 (L) 05/16/2023 1434   PLT 326.0 05/16/2023 1434   MCV 99.9 05/16/2023 1434   MCH 31.0 02/18/2016 0322   MCHC 32.6 05/16/2023 1434   RDW 14.5 05/16/2023 1434   LYMPHSABS 0.9 05/16/2023 1434   MONOABS 0.5 05/16/2023 1434   EOSABS 0.0 05/16/2023 1434   BASOSABS 0.1 05/16/2023 1434    No results found for: "POCLITH", "LITHIUM"   No results found for: "PHENYTOIN", "PHENOBARB", "VALPROATE", "CBMZ"   .res Assessment: Plan:    Madeline Young was seen today for follow-up, depression, anxiety, add, sleeping problem and stress.  Diagnoses and all orders for this visit:  Depression, major, recurrent, in partial remission (HCC)  Attention deficit hyperactivity disorder (ADHD), predominantly inattentive type  Insomnia due to mental condition  Other chronic back pain   psoriatic arthritis, OA, and inflammatory   30 min with patient was spent on counseling and coordination of care. We discussed the following.  Discussed potential benefits, risks, and side effects of stimulants with patient to include increased heart rate, palpitations, insomnia, increased anxiety, increased irritability, or decreased appetite.  Instructed patient to contact office if experiencing any significant tolerability issues.  Disc risk steroids affecting mood and sleep.  Supportive therapy dealing with stubborn H with memory problems. Disc  rheum consultation and stress from dealing with it.    Ok switch to modafinil 100 mg AM for 1 week then 200 mg and DC  Vyvanse. Looking for less irritability.  Consider memantine off label because of chronic poorly controlled ADHD. Option Qelbree Option retry lower dose Namenda off label.  Switch duloxetine to 60 mg daily. For mood and potentially for pain.   Able to stop Ambien.  Using melatonin and CBD gummy  Consider alternative treatments for ADD including off label.  FU 6 weeks.  Meredith Staggers, MD, DFAPA   Please see After Visit Summary for patient specific instructions.  Future Appointments  Date Time Provider Department Center  05/21/2023  4:45 PM Johnn Hai, PT [facility] None  05/23/2023  2:30 PM Johnn Hai, PT [facility] None  05/28/2023  4:00 PM Johnn Hai, PT [facility] None  06/04/2023  4:00 PM Johnn Hai, PT [facility] None  06/06/2023  1:00 PM Johnn Hai, PT [facility] None  06/12/2023  2:30 PM Johnn Hai, PT [facility] None  06/18/2023  3:15 PM Johnn Hai, PT [facility] None  06/20/2023  3:15 PM Johnn Hai, PT [facility] None  06/25/2023  3:15 PM Johnn Hai, PT [facility] None  06/27/2023  3:15 PM Johnn Hai, PT [facility] None  07/02/2023  3:15 PM Johnn Hai, PT [facility] None  07/04/2023  3:15 PM Johnn Hai, PT [facility] None  07/09/2023  3:15 PM Johnn Hai, PT [facility] None  07/11/2023  3:15 PM Johnn Hai, PT [facility] None  07/16/2023  3:15 PM Johnn Hai, PT [facility] None  07/18/2023  3:15 PM Johnn Hai, PT [facility] None  07/23/2023  3:15 PM Johnn Hai, PT [facility] None  07/25/2023  3:15 PM Johnn Hai, PT [facility] None  08/13/2023  1:30 PM Sherlene Shams, MD [facility]  08/20/2023 11:00 AM [facility] ANNUAL WELLNESS VISIT [facility]    No orders of the defined types were placed in this encounter.      -------------------------------

## 2023-05-18 ENCOUNTER — Telehealth: Payer: Self-pay | Admitting: Psychiatry

## 2023-05-18 NOTE — Assessment & Plan Note (Signed)
With persistent low back pain and right sided sciatica despite prior surgery.  Her pain is not relieved with NSAIDs or recent ESI.   Management of her pain requires  refill on narcotics for current use 4 times daily . Marland Kitchen Refill history confirmed via [place] Controlled Substance database, accessed by me today. And refills on oxycodone prescribed for 3 months .  Encouraged to add TENS unit . Lyrica was stopped due to intolerable side effects of altered concentration and LE edema.

## 2023-05-18 NOTE — Assessment & Plan Note (Signed)
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  Her pain is not relieved with NSAIDs , prednisone nor recent ESI. .   Management of her pain requires  monthly refill on narcotics for  four  times daily   use . Marland Kitchen Refill history confirmed via [place] Controlled Substance database, accessed by me today.. refills given for 3 months .

## 2023-05-18 NOTE — Assessment & Plan Note (Signed)
Using trospium  every 12 hours.

## 2023-05-18 NOTE — Assessment & Plan Note (Addendum)
At last visit her inflammatory markers were normal (ESR and CRP).  Repeat markers are low. Will encourage reduction in prednisone dose to 5 mg daily .  She is taking celebrex as well   Lab Results  Component Value Date   ESRSEDRATE -8 (L) 05/16/2023   Lab Results  Component Value Date   CRP <1.0 05/16/2023

## 2023-05-18 NOTE — Assessment & Plan Note (Signed)
Symptoms recurred even with  trial of Repatha

## 2023-05-18 NOTE — Telephone Encounter (Signed)
Pharmacy request PA for MODAFINIL 200mg  , See CMM

## 2023-05-18 NOTE — Assessment & Plan Note (Signed)
Stable ,managed for years by Dr. Jennelle Human with modafanil and cymbalta for anxiety .

## 2023-05-21 ENCOUNTER — Telehealth: Payer: Self-pay | Admitting: Internal Medicine

## 2023-05-21 ENCOUNTER — Ambulatory Visit: Payer: Medicare HMO | Attending: Orthopedic | Admitting: Physical Therapy

## 2023-05-21 DIAGNOSIS — R262 Difficulty in walking, not elsewhere classified: Secondary | ICD-10-CM | POA: Diagnosis not present

## 2023-05-21 DIAGNOSIS — R2689 Other abnormalities of gait and mobility: Secondary | ICD-10-CM | POA: Insufficient documentation

## 2023-05-21 DIAGNOSIS — M5459 Other low back pain: Secondary | ICD-10-CM | POA: Diagnosis not present

## 2023-05-21 DIAGNOSIS — M533 Sacrococcygeal disorders, not elsewhere classified: Secondary | ICD-10-CM | POA: Insufficient documentation

## 2023-05-21 DIAGNOSIS — R278 Other lack of coordination: Secondary | ICD-10-CM | POA: Diagnosis not present

## 2023-05-21 DIAGNOSIS — R1905 Periumbilic swelling, mass or lump: Secondary | ICD-10-CM

## 2023-05-21 NOTE — Therapy (Unsigned)
OUTPATIENT PHYSICAL THERAPY THORACOLUMBAR EVALUATION   Patient Name: Madeline Young MRN: [id number] DOB:1955-10-13, 67 y.o., female Today's Date: 05/21/2023  END OF SESSION:  PT End of Session - 05/21/23 1741     Visit Number 1    Number of Visits 20    Date for PT Re-Evaluation 07/30/23    Authorization Type [employer] [id number]    Authorization Time Period 05/21/23-07/30/23    Authorization - Visit Number 1    Authorization - Number of Visits 20    Progress Note Due on Visit 10    PT Start Time 1645    PT Stop Time 1730    PT Time Calculation (min) 45 min    Activity Tolerance Patient limited by pain    Behavior During Therapy [facility] for tasks assessed/performed             Past Medical History:  Diagnosis Date   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   ADHD (attention deficit hyperactivity disorder)    Allergy    Anxiety    Arthritis    Cataract    bil cateracts removed   Complication of anesthesia    first colonoscopy pt states she woke up   Depression    GERD (gastroesophageal reflux disease)    Globus sensation    Hyperlipidemia    Incontinence    bowels at times   Insomnia    Multinodular goiter (nontoxic)    Psoriatic arthritis (HCC)    Spinal stenosis of lumbar region 03/10/2015   MRI    Statin intolerance 01/27/2013   Past Surgical History:  Procedure Laterality Date   BACK SURGERY  02/17/2016   Dr. Leotis Shames- L4-L5 fusion    BIOPSY THYROID  2014   COLONOSCOPY     COLONOSCOPY W/ POLYPECTOMY     EYE SURGERY     bilateral cataract surgery w/ lens implant   FOOT SURGERY  2006   Right foot , secondary to severe loss of joint [facility])   POLYPECTOMY     TONSILLECTOMY     UPPER GASTROINTESTINAL ENDOSCOPY  2005   With empiric esophageal dilation   Patient Active Problem List   Diagnosis Date Noted   Urge incontinence 05/16/2022   Onychomycosis 08/17/2021   Drug allergy 06/29/2021   Fibromyalgia 06/29/2021   Generalized osteoarthritis 06/29/2021    Hand joint pain 06/29/2021   Other intervertebral disc degeneration, lumbar region 06/29/2021   Other long term (current) drug therapy 06/29/2021   Sciatica 01/28/2020   Rhinitis, non-allergic 11/10/2019   Insomnia 08/10/2019   Anxiety 10/11/2018   Carotid artery stenosis, asymptomatic, bilateral 10/29/2016   ADD (attention deficit disorder) 10/29/2016   Spondylolisthesis of lumbar region 02/17/2016   Globus sensation since at least 2005 04/29/2015   GERD (gastroesophageal reflux disease) 11/17/2014   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   Travel advice encounter 01/27/2013   Multinodular goiter (nontoxic) 08/11/2012   Overweight (BMI 25.0-29.9) 08/11/2012   Psoriatic arthritis, destructive type (HCC)    Screening for cervical cancer 08/21/2011   Screening for breast cancer 08/21/2011   Hx of adenomatous polyp of colon 10/06/2008   Familial hyperlipidemia 09/15/2008   Major depressive disorder, single episode, in remission (HCC) 09/15/2008   ALLERGIC RHINITIS 09/15/2008    PCP: Dr. Duncan Dull   REFERRING PROVIDER: Duke Salvia NP   REFERRING DIAG: Spinal stenosis of lumbar region  Rationale for Evaluation and Treatment: Rehabilitation  THERAPY DIAG:  No diagnosis found.  ONSET DATE: 2017   SUBJECTIVE:  SUBJECTIVE STATEMENT: See pertinent History   PERTINENT HISTORY:  Pt reports that she has three different types of arthritis: osteo, rheumatoid, and psoriatic. She had lumbar fusion in 2017 which she felt did not help significantly. She had an infusion for her psoriatic arthritis this past spring and it caused her low back pain. She recently had a spinal injection and this resolved radiating pain on her LLE, but she still has excruciating, radiating pain down her right lower extremity. She feels  unsteady most of the time and she has to use walker to keep from falling. Pt has also been told that she needs a reverse total shoulder because right shoulder RTC damage.   PAIN:  Are you having pain? Yes: NPRS scale: 7/10 Pain location: Left shoulder and L1-L5  Pain description: Sharp, dull achy  Aggravating factors: Walking  Relieving factors: Sitting still  PRECAUTIONS: None  RED FLAGS: Bowel or bladder incontinence: Yes: intermittent, Spinal tumors: No, Cauda equina syndrome: No, Compression fracture: No, and Abdominal aneurysm: No   WEIGHT BEARING RESTRICTIONS: No  FALLS:  Has patient fallen in last 6 months? Yes. Number of falls 1 fell on left side while on vacation at beach  LIVING ENVIRONMENT: Lives with: lives with their family, lives with their spouse, and lives with their daughter Lives in: House/apartment Stairs: Yes: External: 3-4 steps; on right going up Has following equipment at home: Environmental consultant - 4 wheeled  OCCUPATION: Retired   PLOF: Independent  PATIENT GOALS: To improve balance and to decrease   NEXT MD VISIT: 08/10/23  OBJECTIVE:   VITALS: BP 144/66  HR 98 SpO2 98%  DIAGNOSTIC FINDINGS:  CLINICAL DATA:  Chronic, progressively worsening low back pain, worse on the right. History of prior fusion in 2017.   EXAM: LUMBAR MYELOGRAM   CT LUMBAR MYELOGRAM   FLUOROSCOPY: Radiation Exposure Index (as provided by the fluoroscopic device): 19.3 mGy Kerma   PROCEDURE: After thorough discussion of risks and benefits of the procedure including bleeding, infection, injury to nerves, blood vessels, adjacent structures as well as headache and CSF leak, written and oral informed consent was obtained. Consent was obtained by Dr. Malachi Pro. Time out form was completed.   Patient was positioned prone on the fluoroscopy table. Local anesthesia was provided with 1% lidocaine without epinephrine after prepped and draped in the usual sterile fashion. Puncture  was performed at L1-L2 using a 3 1/2 inch 22-gauge spinal needle via right interlaminar approach. Using a single pass through the dura, the needle was placed within the thecal sac, with return of clear CSF. 15 mL of Isovue M-200 was injected into the thecal sac, with normal opacification of the nerve roots and cauda equina consistent with free flow within the subarachnoid space.   I personally performed the lumbar puncture and administered the intrathecal contrast. I also personally supervised acquisition of the myelogram images.   TECHNIQUE: Contiguous axial images were obtained through the lumbar spine after the intrathecal infusion of contrast. Coronal and sagittal reconstructions were obtained of the axial image sets.   COMPARISON:  Lumbar spine x-rays dated August 13, 2018. CT lumbar myelogram dated July 18, 2017.   FINDINGS: LUMBAR MYELOGRAM FINDINGS:   Prior L4-S1 PLIF. Hardware is intact. Progressive dextroscoliosis. Chronic fused mild anterolisthesis at L4-L5 and L5-S1. No dynamic instability.   Small posterior disc osteophyte complexes from L1-L2 through L3-L4. Mild spinal canal stenosis at L1-L2 and L2-L3. Mild-to-moderate spinal canal stenosis at L3-L4.   CT LUMBAR MYELOGRAM FINDINGS:   Segmentation:  Standard.   Alignment: Progressive dextroscoliosis. Unchanged mild fused anterolisthesis at L4-L5 and L5-S1.   Vertebrae: Acute nondisplaced fracture of the right L1 transverse process. Chronic mild T12 inferior endplate compression deformity, new since 2018. Prior L4-S1 PLIF with interval solid interbody and posterior osseous fusion. Unchanged mild lucency surrounding the bilateral S1 pedicle screws.   Conus medullaris and cauda equina: Conus extends to the L1-L2 level. Conus and cauda equina appear normal.   Paraspinal and other soft tissues: Aortoiliac atherosclerotic vascular disease.   Disc levels:   T11-T12: Mild disc bulging and bilateral facet  arthropathy. Mild right neuroforaminal stenosis. No spinal canal or left neuroforaminal stenosis.   T12-L1: Progressive diffuse disc bulging eccentric to the right. Large right subarticular disc extrusion/extruded disc fragment migrating inferiorly posterior to the L1 vertebral body. Smaller left subarticular extruded disc material with vacuum phenomenon posterior to the L1 vertebral body, potentially arising from the T12-L1 or L1-L2 disc. Large right far lateral disc extrusion migrating superiorly. Mild bilateral facet arthropathy. Mild-to-moderate spinal canal and right lateral recess stenosis. Mild left lateral recess stenosis. Mild right neuroforaminal stenosis. No left neuroforaminal stenosis. Findings have progressed since 2018.   L1-L2: Small circumferential disc osteophyte complex moderate left and mild right facet arthropathy. Mild-to-moderate left lateral recess stenosis. Mild bilateral neuroforaminal stenosis. No spinal canal stenosis. Findings have progressed since 2018.   L2-L3: Mild disc bulging and endplate spurring eccentric to the left. Small amount of air in the left inferior subarticular base posterior to the L3 vertebral body, presumably within extruded disc. Moderate to severe left and mild right facet arthropathy. Mild left lateral recess and neuroforaminal stenosis. No spinal canal or right neuroforaminal stenosis. Findings have progressed since 2018.   L3-L4: Circumferential disc osteophyte complex with severe right and moderate to severe left facet arthropathy. Moderate spinal canal stenosis. Mild to moderate right and mild left lateral recess stenosis. Moderate left and mild right neuroforaminal stenosis. Findings have progressed since 2018.   L4-L5: Prior posterior decompression and PLIF. No residual stenosis.   L5-S1: Prior posterior decompression and PLIF. No residual stenosis.   IMPRESSION: 1. Prior L4-S1 PLIF with interval solid interbody and  posterior osseous fusion. Unchanged mild lucency surrounding the bilateral S1 pedicle screws, concerning for loosening. No residual stenosis. 2. Progressive multilevel lumbar spondylosis as described above. Mild-to-moderate spinal canal and right lateral recess stenosis at T12-L1. Moderate spinal canal and left neuroforaminal stenosis at L3-L4. 3. Acute nondisplaced fracture of the right L1 transverse process. 4. Chronic mild T12 inferior endplate compression deformity, new since 2018. 5.  Aortic Atherosclerosis (ICD10-I70.0).     Electronically Signed   By: Obie Dredge M.D.   On: 02/15/2023 14:26  PATIENT SURVEYS:  FOTO 40/100 with target 47   SCREENING FOR RED FLAGS: Bowel or bladder incontinence: Yes: Intermittent  Spinal tumors: No Cauda equina syndrome: No Compression fracture: No Abdominal aneurysm: No  COGNITION: Overall cognitive status: Within functional limits for tasks assessed     SENSATION: Light touch: Impaired   Numbness and tingling in left quad   MUSCLE LENGTH: None tested    POSTURE: rounded shoulders  PALPATION: L1-L5 TTP   LUMBAR ROM:   AROM eval  Flexion 100%  Extension 50%*  Right lateral flexion 100%  Left lateral flexion 100%  Right rotation 100%  Left rotation 100%   (Blank rows = not tested)  LOWER EXTREMITY ROM:     Active  Right eval Left eval  Hip flexion    Hip extension  Hip abduction    Hip adduction    Hip internal rotation    Hip external rotation    Knee flexion    Knee extension    Ankle dorsiflexion    Ankle plantarflexion    Ankle inversion    Ankle eversion     (Blank rows = not tested)  LOWER EXTREMITY MMT:    MMT Right eval Left eval  Hip flexion 4 4  Hip extension NT  NT   Hip abduction NT  NT   Hip adduction    Hip internal rotation    Hip external rotation    Knee flexion 4- 4-  Knee extension 4- 4-  Ankle dorsiflexion 4- 4-  Ankle plantarflexion    Ankle inversion    Ankle  eversion     (Blank rows = not tested)  LUMBAR SPECIAL TESTS: None tested   FUNCTIONAL TESTS:  5 times sit to stand: 0 reps  30 seconds chair stand test: 0 reps  6 minute walk test:  10 meter walk test: *  GAIT: Distance walked: 50 ft  Assistive device utilized: None Level of assistance: Complete Independence Comments: Bilateral antalgic gait with decreased step length and stance time   TODAY'S TREATMENT:                                                                                                                              DATE:   Education on single point cane: Place in strong side and step with opposite foot while placing cane down on ground at same time     PATIENT EDUCATION:  Education details: form and technique for correct performance of exercise. How to use SPC correctly and safely  Person educated: Patient Education method: Explanation, Demonstration, Verbal cues, and Handouts Education comprehension: verbalized understanding, returned demonstration, and verbal cues required  HOME EXERCISE PROGRAM: Purchase single point cane and practice gait pattern  ASSESSMENT:  CLINICAL IMPRESSION: Patient is a 67 y.o. white woman who was seen today for physical therapy evaluation and treatment for chronic low back pain. Pt has pmh that includes rheumatoid, psoriatic, and spinal osteoarthritis along with lumbar fusion in 2017. She demonstrates signs and symptoms that make her most appropriate for the symptom modulation treatment based classification group with high pain and disability and volatile symptom status. She exhibits decreased LE strength and endurance along with decreased dynamic balance that place her at an increased risk for falls and that are limiting her mobility. She will benefit from skilled PT to address these aforementioned deficits to improve patient's quality of life, reduce low back pain and improve function to participate more in physical activities.    OBJECTIVE IMPAIRMENTS: Abnormal gait, decreased balance, decreased endurance, decreased knowledge of use of DME, decreased mobility, difficulty walking, decreased ROM, decreased strength, hypomobility, impaired flexibility, impaired sensation, impaired UE functional use, and pain.   ACTIVITY LIMITATIONS: carrying, lifting, bending, standing, squatting, sleeping, stairs, bed mobility, bathing, toileting, dressing,  reach over head, hygiene/grooming, and locomotion level  PARTICIPATION LIMITATIONS: cleaning, laundry, driving, shopping, community activity, and yard work  PERSONAL FACTORS: Past/current experiences, Time since onset of injury/illness/exacerbation, and 3+ comorbidities: psoriatic and rheumatoid arthritis,   are also affecting patient's functional outcome.   REHAB POTENTIAL: Fair chronicity of low back and multiple co-morbidities  CLINICAL DECISION MAKING: Evolving/moderate complexity  EVALUATION COMPLEXITY: Moderate   GOALS: Goals reviewed with patient? No  SHORT TERM GOALS: Target date: 06/05/2023  Pt will be independent with HEP in order to improve strength and balance in order to decrease fall risk and improve function at home and work. Baseline: NT  Goal status: INITIAL  2.  Pt will increase by at least 0.13 m/s in order to demonstrate clinically significant improvement in community ambulation.  Baseline:  NT  Goal status: INITIAL  3.  Pt will increase by at least 65m (124ft) in order to demonstrate clinically significant improvement in cardiopulmonary endurance and community ambulation Baseline:  NT  Goal status: INITIAL  4.  Patient will show correct use of single point cane and rollator for improved ambulation and to decrease risk of falling.  Baseline: NT  Goal status: INITIAL    LONG TERM GOALS: Target date: 07/31/2023  Patient will have improved function and activity level as evidenced by an increase in FOTO score by 10 points or more.   Baseline: 40/100 with target of 47  Goal status: INITIAL  2.  Patient will ambulate >=1000 ft for as evidence of improved aerobic endurance and lumbar function.  Baseline: NT  Goal status: INITIAL  3. Patient will perform 10 meter walk test in >=1 m/sec for improved mobility and to decrease her risk of falling. Baseline: NT  Goal status: INITIAL  4.  Patient will demonstrate an improvement in DGI score of >=2 pts as evidence of improved dynamic balance to decrease risk of falling (Pardasaney et al, 2012). Baseline: NT  Goal status: INITIAL   PLAN:  PT FREQUENCY: 1-2x/week  PT DURATION: 10 weeks  PLANNED INTERVENTIONS: Therapeutic exercises, Therapeutic activity, Neuromuscular re-education, Balance training, Gait training, Patient/Family education, Self Care, Joint mobilization, Joint manipulation, Stair training, Vestibular training, Canalith repositioning, DME instructions, Aquatic Therapy, Dry Needling, Electrical stimulation, Spinal manipulation, Spinal mobilization, Cryotherapy, Moist heat, Traction, Ultrasound, Manual therapy, and Re-evaluation.  PLAN FOR NEXT SESSION: , , DGI, rollator training, and progress LE strengthening and introduce balance exercises    Ellin Goodie PT, DPT  [facility] [address] [place], [address] Phone: [phone]   Fax:  [phone]

## 2023-05-21 NOTE — Telephone Encounter (Signed)
Pt called in stating if Dr. Darrick Huntsman can put the CT Scan order in for her? Pt stated she would like to get it done the same day she goes for her x-rays.

## 2023-05-22 ENCOUNTER — Other Ambulatory Visit: Payer: Self-pay

## 2023-05-23 ENCOUNTER — Ambulatory Visit: Payer: Medicare HMO | Admitting: Physical Therapy

## 2023-05-23 ENCOUNTER — Encounter: Payer: Self-pay | Admitting: Internal Medicine

## 2023-05-23 DIAGNOSIS — R262 Difficulty in walking, not elsewhere classified: Secondary | ICD-10-CM

## 2023-05-23 DIAGNOSIS — M5459 Other low back pain: Secondary | ICD-10-CM | POA: Diagnosis not present

## 2023-05-23 DIAGNOSIS — R2689 Other abnormalities of gait and mobility: Secondary | ICD-10-CM | POA: Diagnosis not present

## 2023-05-23 DIAGNOSIS — M533 Sacrococcygeal disorders, not elsewhere classified: Secondary | ICD-10-CM | POA: Diagnosis not present

## 2023-05-23 DIAGNOSIS — R1900 Intra-abdominal and pelvic swelling, mass and lump, unspecified site: Secondary | ICD-10-CM | POA: Insufficient documentation

## 2023-05-23 DIAGNOSIS — R278 Other lack of coordination: Secondary | ICD-10-CM | POA: Diagnosis not present

## 2023-05-23 NOTE — Telephone Encounter (Signed)
CT scan was for her hernia

## 2023-05-23 NOTE — Therapy (Addendum)
OUTPATIENT PHYSICAL THERAPY THORACOLUMBAR TREATMENT   Patient Name: Madeline Young MRN: [id number] DOB:Jun 10, 1956, 67 y.o., female Today's Date: 05/23/2023  END OF SESSION:  PT End of Session - 05/23/23 1437     Visit Number 2    Number of Visits 20    Date for PT Re-Evaluation 07/30/23    Authorization Type [employer] 2024    Authorization Time Period 05/21/23-07/30/23    Authorization - Visit Number 2    Authorization - Number of Visits 20    Progress Note Due on Visit 10    PT Start Time 1435    PT Stop Time 1515    PT Time Calculation (min) 40 min    Activity Tolerance Patient limited by pain    Behavior During Therapy [facility] for tasks assessed/performed             Past Medical History:  Diagnosis Date   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   ADHD (attention deficit hyperactivity disorder)    Allergy    Anxiety    Arthritis    Cataract    bil cateracts removed   Complication of anesthesia    first colonoscopy pt states she woke up   Depression    GERD (gastroesophageal reflux disease)    Globus sensation    Hyperlipidemia    Incontinence    bowels at times   Insomnia    Multinodular goiter (nontoxic)    Psoriatic arthritis (HCC)    Spinal stenosis of lumbar region 03/10/2015   MRI    Statin intolerance 01/27/2013   Past Surgical History:  Procedure Laterality Date   BACK SURGERY  02/17/2016   Dr. Leotis Shames- L4-L5 fusion    BIOPSY THYROID  2014   COLONOSCOPY     COLONOSCOPY W/ POLYPECTOMY     EYE SURGERY     bilateral cataract surgery w/ lens implant   FOOT SURGERY  2006   Right foot , secondary to severe loss of joint [facility])   POLYPECTOMY     TONSILLECTOMY     UPPER GASTROINTESTINAL ENDOSCOPY  2005   With empiric esophageal dilation   Patient Active Problem List   Diagnosis Date Noted   Mass of abdomen 05/23/2023   Urge incontinence 05/16/2022   Onychomycosis 08/17/2021   Drug allergy 06/29/2021   Fibromyalgia 06/29/2021   Generalized  osteoarthritis 06/29/2021   Hand joint pain 06/29/2021   Other intervertebral disc degeneration, lumbar region 06/29/2021   Other long term (current) drug therapy 06/29/2021   Sciatica 01/28/2020   Rhinitis, non-allergic 11/10/2019   Insomnia 08/10/2019   Anxiety 10/11/2018   Carotid artery stenosis, asymptomatic, bilateral 10/29/2016   ADD (attention deficit disorder) 10/29/2016   Spondylolisthesis of lumbar region 02/17/2016   Globus sensation since at least 2005 04/29/2015   GERD (gastroesophageal reflux disease) 11/17/2014   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   Travel advice encounter 01/27/2013   Multinodular goiter (nontoxic) 08/11/2012   Overweight (BMI 25.0-29.9) 08/11/2012   Psoriatic arthritis, destructive type (HCC)    Screening for cervical cancer 08/21/2011   Screening for breast cancer 08/21/2011   Hx of adenomatous polyp of colon 10/06/2008   Familial hyperlipidemia 09/15/2008   Major depressive disorder, single episode, in remission (HCC) 09/15/2008   ALLERGIC RHINITIS 09/15/2008    PCP: Dr. Duncan Dull   REFERRING PROVIDER: Duke Salvia NP   REFERRING DIAG: Spinal stenosis of lumbar region  Rationale for Evaluation and Treatment: Rehabilitation  THERAPY DIAG:  Other low back pain  Difficulty  in walking, not elsewhere classified  ONSET DATE: 2017   SUBJECTIVE:                                                                                                                                                                                           SUBJECTIVE STATEMENT: Pt reports a recent pain flare and she is not sure what caused it.   PERTINENT HISTORY:  Pt reports that she has three different types of arthritis: osteo, rheumatoid, and psoriatic. She had lumbar fusion in 2017 which she felt did not help significantly. She had an infusion for her psoriatic arthritis this past spring and it caused her low back pain. She recently had a spinal injection and this  resolved radiating pain on her LLE, but she still has excruciating, radiating pain down her right lower extremity. She feels unsteady most of the time and she has to use walker to keep from falling. Pt has also been told that she needs a reverse total shoulder because right shoulder RTC damage.   PAIN:  Are you having pain? Yes: NPRS scale: 7/10 Pain location: Left shoulder and L1-L5  Pain description: Sharp, dull achy  Aggravating factors: Walking  Relieving factors: Sitting still  PRECAUTIONS: None  RED FLAGS: Bowel or bladder incontinence: Yes: intermittent, Spinal tumors: No, Cauda equina syndrome: No, Compression fracture: No, and Abdominal aneurysm: No   WEIGHT BEARING RESTRICTIONS: No  FALLS:  Has patient fallen in last 6 months? Yes. Number of falls 1 fell on left side while on vacation at beach  LIVING ENVIRONMENT: Lives with: lives with their family, lives with their spouse, and lives with their daughter Lives in: House/apartment Stairs: Yes: External: 3-4 steps; on right going up Has following equipment at home: Environmental consultant - 4 wheeled  OCCUPATION: Retired   PLOF: Independent  PATIENT GOALS: To improve balance and to decrease   NEXT MD VISIT: 08/10/23  OBJECTIVE:   VITALS: BP 144/66  HR 98 SpO2 98%  DIAGNOSTIC FINDINGS:  CLINICAL DATA:  Chronic, progressively worsening low back pain, worse on the right. History of prior fusion in 2017.   EXAM: LUMBAR MYELOGRAM   CT LUMBAR MYELOGRAM   FLUOROSCOPY: Radiation Exposure Index (as provided by the fluoroscopic device): 19.3 mGy Kerma   PROCEDURE: After thorough discussion of risks and benefits of the procedure including bleeding, infection, injury to nerves, blood vessels, adjacent structures as well as headache and CSF leak, written and oral informed consent was obtained. Consent was obtained by Dr. Malachi Pro. Time out form was completed.   Patient was positioned prone on the fluoroscopy table.  Local anesthesia was provided with  1% lidocaine without epinephrine after prepped and draped in the usual sterile fashion. Puncture was performed at L1-L2 using a 3 1/2 inch 22-gauge spinal needle via right interlaminar approach. Using a single pass through the dura, the needle was placed within the thecal sac, with return of clear CSF. 15 mL of Isovue M-200 was injected into the thecal sac, with normal opacification of the nerve roots and cauda equina consistent with free flow within the subarachnoid space.   I personally performed the lumbar puncture and administered the intrathecal contrast. I also personally supervised acquisition of the myelogram images.   TECHNIQUE: Contiguous axial images were obtained through the lumbar spine after the intrathecal infusion of contrast. Coronal and sagittal reconstructions were obtained of the axial image sets.   COMPARISON:  Lumbar spine x-rays dated August 13, 2018. CT lumbar myelogram dated July 18, 2017.   FINDINGS: LUMBAR MYELOGRAM FINDINGS:   Prior L4-S1 PLIF. Hardware is intact. Progressive dextroscoliosis. Chronic fused mild anterolisthesis at L4-L5 and L5-S1. No dynamic instability.   Small posterior disc osteophyte complexes from L1-L2 through L3-L4. Mild spinal canal stenosis at L1-L2 and L2-L3. Mild-to-moderate spinal canal stenosis at L3-L4.   CT LUMBAR MYELOGRAM FINDINGS:   Segmentation: Standard.   Alignment: Progressive dextroscoliosis. Unchanged mild fused anterolisthesis at L4-L5 and L5-S1.   Vertebrae: Acute nondisplaced fracture of the right L1 transverse process. Chronic mild T12 inferior endplate compression deformity, new since 2018. Prior L4-S1 PLIF with interval solid interbody and posterior osseous fusion. Unchanged mild lucency surrounding the bilateral S1 pedicle screws.   Conus medullaris and cauda equina: Conus extends to the L1-L2 level. Conus and cauda equina appear normal.   Paraspinal and  other soft tissues: Aortoiliac atherosclerotic vascular disease.   Disc levels:   T11-T12: Mild disc bulging and bilateral facet arthropathy. Mild right neuroforaminal stenosis. No spinal canal or left neuroforaminal stenosis.   T12-L1: Progressive diffuse disc bulging eccentric to the right. Large right subarticular disc extrusion/extruded disc fragment migrating inferiorly posterior to the L1 vertebral body. Smaller left subarticular extruded disc material with vacuum phenomenon posterior to the L1 vertebral body, potentially arising from the T12-L1 or L1-L2 disc. Large right far lateral disc extrusion migrating superiorly. Mild bilateral facet arthropathy. Mild-to-moderate spinal canal and right lateral recess stenosis. Mild left lateral recess stenosis. Mild right neuroforaminal stenosis. No left neuroforaminal stenosis. Findings have progressed since 2018.   L1-L2: Small circumferential disc osteophyte complex moderate left and mild right facet arthropathy. Mild-to-moderate left lateral recess stenosis. Mild bilateral neuroforaminal stenosis. No spinal canal stenosis. Findings have progressed since 2018.   L2-L3: Mild disc bulging and endplate spurring eccentric to the left. Small amount of air in the left inferior subarticular base posterior to the L3 vertebral body, presumably within extruded disc. Moderate to severe left and mild right facet arthropathy. Mild left lateral recess and neuroforaminal stenosis. No spinal canal or right neuroforaminal stenosis. Findings have progressed since 2018.   L3-L4: Circumferential disc osteophyte complex with severe right and moderate to severe left facet arthropathy. Moderate spinal canal stenosis. Mild to moderate right and mild left lateral recess stenosis. Moderate left and mild right neuroforaminal stenosis. Findings have progressed since 2018.   L4-L5: Prior posterior decompression and PLIF. No residual stenosis.   L5-S1:  Prior posterior decompression and PLIF. No residual stenosis.   IMPRESSION: 1. Prior L4-S1 PLIF with interval solid interbody and posterior osseous fusion. Unchanged mild lucency surrounding the bilateral S1 pedicle screws, concerning for loosening. No residual stenosis. 2. Progressive  multilevel lumbar spondylosis as described above. Mild-to-moderate spinal canal and right lateral recess stenosis at T12-L1. Moderate spinal canal and left neuroforaminal stenosis at L3-L4. 3. Acute nondisplaced fracture of the right L1 transverse process. 4. Chronic mild T12 inferior endplate compression deformity, new since 2018. 5.  Aortic Atherosclerosis (ICD10-I70.0).     Electronically Signed   By: Obie Dredge M.D.   On: 02/15/2023 14:26  PATIENT SURVEYS:  FOTO 40/100 with target 47   SCREENING FOR RED FLAGS: Bowel or bladder incontinence: Yes: Intermittent  Spinal tumors: No Cauda equina syndrome: No Compression fracture: No Abdominal aneurysm: No  COGNITION: Overall cognitive status: Within functional limits for tasks assessed     SENSATION: Light touch: Impaired   Numbness and tingling in left quad   MUSCLE LENGTH: None tested    POSTURE: rounded shoulders  PALPATION: L1-L5 TTP   LUMBAR ROM:   AROM eval  Flexion 100%  Extension 50%*  Right lateral flexion 100%  Left lateral flexion 100%  Right rotation 100%  Left rotation 100%   (Blank rows = not tested)  LOWER EXTREMITY ROM:     Active  Right eval Left eval  Hip flexion    Hip extension    Hip abduction    Hip adduction    Hip internal rotation    Hip external rotation    Knee flexion    Knee extension    Ankle dorsiflexion    Ankle plantarflexion    Ankle inversion    Ankle eversion     (Blank rows = not tested)  LOWER EXTREMITY MMT:    MMT Right eval Left eval  Hip flexion 4 4  Hip extension NT  NT   Hip abduction NT  NT   Hip adduction    Hip internal rotation    Hip external rotation     Knee flexion 4- 4-  Knee extension 4- 4-  Ankle dorsiflexion 4- 4-  Ankle plantarflexion    Ankle inversion    Ankle eversion     (Blank rows = not tested)  LUMBAR SPECIAL TESTS: None tested   FUNCTIONAL TESTS:  5 times sit to stand: 0 reps  30 seconds chair stand test: 0 reps  6 minute walk test:  10 meter walk test: *  GAIT: Distance walked: 50 ft  Assistive device utilized: None Level of assistance: Complete Independence Comments: Bilateral antalgic gait with decreased step length and stance time   TODAY'S TREATMENT:                                                                                                                              DATE:   05/23/23: Nu Step with seat and arms at 6 with no resistance for 5 min  : 800 ft without single point cane  10 Meter Walk Test   Gait Speed:    1st trial 12.45 sec , 2nd trial 11.81 , Avg= 12.31  sec -  Normal Gait speed Avg= 0.81 m/sec   Fast Gait Speed: 1st trial  9.55 sec, 2nd  sec, Avg= 10.30  sec -Fast Gait speed time= 1.01 m/sec   -> 1 m/sec [employer] and cross street safely and normal WS >1 m/sec Need Intervention to reduce falls risk  0.12 m/sec improvement represents a statistically significant improvement in gait speed    - 0.8 - 1.3 m/sec - community ambulator - associated with increased independence in self-care  Sit to Stand with 1 UE support 1 x 10 -min VC to   Eval:  Education on single point cane: Place in strong side and step with opposite foot while placing cane down on ground at same time     PATIENT EDUCATION:  Education details: form and technique for correct performance of exercise. How to use SPC correctly and safely  Person educated: Patient Education method: Explanation, Demonstration, Verbal cues, and Handouts Education comprehension: verbalized understanding, returned demonstration, and verbal cues required  HOME EXERCISE PROGRAM: Purchase single point cane and practice  gait pattern  Access Code: [email or web address] URL: [email or web address] Date: 05/23/2023 Prepared by: Ellin Goodie  Exercises - Sit to Stand  - 3-4 x weekly - 2 sets - 10 reps  ASSESSMENT:  CLINICAL IMPRESSION: Pt presents for initial treatment after eval. Despite neurogenic claudication, pt was able to ambulate 800 ft for with no resting breaks. 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 She does show ongoing left sided antalgic gait that continues to cause her unsteadiness.  She will continue to benefit from skilled PT to address these aforementioned deficits to improve patient's quality of life, reduce low back pain and improve function to participate more in physical activities.   OBJECTIVE IMPAIRMENTS: Abnormal gait, decreased balance, decreased endurance, decreased knowledge of use of DME, decreased mobility, difficulty walking, decreased ROM, decreased strength, hypomobility, impaired flexibility, impaired sensation, impaired UE functional use, and pain.   ACTIVITY LIMITATIONS: carrying, lifting, bending, standing, squatting, sleeping, stairs, bed mobility, bathing, toileting, dressing, reach over head, hygiene/grooming, and locomotion level  PARTICIPATION LIMITATIONS: cleaning, laundry, driving, shopping, community activity, and yard work  PERSONAL FACTORS: Past/current experiences, Time since onset of injury/illness/exacerbation, and 3+ comorbidities: psoriatic and rheumatoid arthritis,   are also affecting patient's functional outcome.   REHAB POTENTIAL: Fair chronicity of low back and multiple co-morbidities  CLINICAL DECISION MAKING: Evolving/moderate complexity  EVALUATION COMPLEXITY: Moderate   GOALS: Goals reviewed with patient? No  SHORT TERM GOALS: Target date: 06/05/2023  Pt will be independent with HEP in order to improve strength and balance in order to  decrease fall risk and improve function at home and work. Baseline: NT  Goal status: Ongoing   2.  Pt will increase by at least 0.13 m/s in order to demonstrate clinically significant improvement in community ambulation.  Baseline:  0.81 m/sec  Goal status: ONGOING   3.  Pt will increase by at least 7m (132ft) in order to demonstrate clinically significant improvement in cardiopulmonary endurance and community ambulation Baseline:  800 ft  Goal status: Ongoing   4.  Patient will show correct use of single point cane and rollator for improved ambulation and to decrease risk of falling.  Baseline: NT  Goal status: ONGOING     LONG TERM GOALS: Target date: 07/31/2023  Patient will have improved function and activity level as evidenced by an increase in FOTO score  by 10 points or more.  Baseline: 40/100 with target of 47  Goal status: ONGOING   2.  Patient will ambulate >=1000 ft for as evidence of improved aerobic endurance and lumbar function.  Baseline: 800 ft  Goal status: ONGOING   3. Patient will perform 10 meter walk test in >=1 m/sec for improved mobility and to decrease her risk of falling. Baseline: 0.81 m/sec  Goal status: INITIAL  4.  Patient will demonstrate an improvement in DGI score of >=2 pts as evidence of improved dynamic balance to decrease risk of falling (Pardasaney et al, 2012). Baseline: NT  Goal status: ONGOING    PLAN:  PT FREQUENCY: 1-2x/week  PT DURATION: 10 weeks  PLANNED INTERVENTIONS: Therapeutic exercises, Therapeutic activity, Neuromuscular re-education, Balance training, Gait training, Patient/Family education, Self Care, Joint mobilization, Joint manipulation, Stair training, Vestibular training, Canalith repositioning, DME instructions, Aquatic Therapy, Dry Needling, Electrical stimulation, Spinal manipulation, Spinal mobilization, Cryotherapy, Moist heat, Traction, Ultrasound, Manual therapy, and Re-evaluation.  PLAN FOR  NEXT SESSION:  DGI, rollator training, and progress LE strengthening and introduce balance exercises    Ellin Goodie PT, DPT  [facility] [address], [place], [address] Phone: [phone]   Fax:  [phone]

## 2023-05-23 NOTE — Assessment & Plan Note (Signed)
Exam not consistent with hernia and she has no RF for ventral hernia (no prior surgery)  CT abd/pelvis with CM ordered to rule out mass/hernia

## 2023-05-23 NOTE — Addendum Note (Signed)
Addended by: Sherlene Shams on: 05/23/2023 11:46 AM   Modules accepted: Orders

## 2023-05-23 NOTE — Telephone Encounter (Signed)
Patient called to state she has not heard from anyone regarding her CT scan for her possible hernia.  Patient states she would like to have this done as soon as possible.  Patient states she would like for someone to please follow-up with her.

## 2023-05-23 NOTE — Telephone Encounter (Signed)
LM letting pt know that test has been ordered.

## 2023-05-24 ENCOUNTER — Ambulatory Visit
Admission: RE | Admit: 2023-05-24 | Discharge: 2023-05-24 | Disposition: A | Discharge status: Home / Self Care | Payer: Medicare HMO | Attending: Internal Medicine | Admitting: Internal Medicine

## 2023-05-24 ENCOUNTER — Ambulatory Visit
Admission: RE | Admit: 2023-05-24 | Discharge: 2023-05-24 | Disposition: A | Discharge status: Home / Self Care | Payer: Medicare HMO | Source: Ambulatory Visit | Attending: Internal Medicine | Admitting: Internal Medicine

## 2023-05-24 DIAGNOSIS — M1812 Unilateral primary osteoarthritis of first carpometacarpal joint, left hand: Secondary | ICD-10-CM | POA: Diagnosis not present

## 2023-05-24 DIAGNOSIS — M25532 Pain in left wrist: Secondary | ICD-10-CM

## 2023-05-24 DIAGNOSIS — M19132 Post-traumatic osteoarthritis, left wrist: Secondary | ICD-10-CM

## 2023-05-25 NOTE — Telephone Encounter (Signed)
Let her know insurance won't cover modafinil off label.   She will need to use [facility] to get it.

## 2023-05-25 NOTE — Telephone Encounter (Signed)
Patient aware. Just picked up RF.

## 2023-05-25 NOTE — Telephone Encounter (Signed)
Prior authorization Denied for Modafinil 200 mg since using off label. Pt should have already been told to pay out of pocket.

## 2023-05-28 ENCOUNTER — Ambulatory Visit: Payer: Medicare HMO

## 2023-05-28 DIAGNOSIS — R278 Other lack of coordination: Secondary | ICD-10-CM

## 2023-05-28 DIAGNOSIS — M5459 Other low back pain: Secondary | ICD-10-CM

## 2023-05-28 DIAGNOSIS — R2689 Other abnormalities of gait and mobility: Secondary | ICD-10-CM

## 2023-05-28 DIAGNOSIS — M533 Sacrococcygeal disorders, not elsewhere classified: Secondary | ICD-10-CM

## 2023-05-28 DIAGNOSIS — R262 Difficulty in walking, not elsewhere classified: Secondary | ICD-10-CM | POA: Diagnosis not present

## 2023-05-28 NOTE — Therapy (Addendum)
OUTPATIENT PHYSICAL THERAPY TREATMENT   Patient Name: Madeline Young MRN: [id number] DOB:[birth year], 67 y.o., female Today's Date: 05/28/2023  END OF SESSION:  PT End of Session - 05/28/23 1604     Visit Number 3    Number of Visits 20    Date for PT Re-Evaluation 07/30/23    Authorization Type [employer] 2024    Authorization Time Period 05/21/23-07/30/23    Authorization - Visit Number 3    Progress Note Due on Visit 10    PT Start Time 1600    PT Stop Time 1640    PT Time Calculation (min) 40 min    Activity Tolerance Patient limited by pain;No increased pain    Behavior During Therapy [facility] for tasks assessed/performed             Past Medical History:  Diagnosis Date   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   ADHD (attention deficit hyperactivity disorder)    Allergy    Anxiety    Arthritis    Cataract    bil cateracts removed   Complication of anesthesia    first colonoscopy pt states she woke up   Depression    GERD (gastroesophageal reflux disease)    Globus sensation    Hyperlipidemia    Incontinence    bowels at times   Insomnia    Multinodular goiter (nontoxic)    Psoriatic arthritis (HCC)    Spinal stenosis of lumbar region 03/10/2015   MRI    Statin intolerance 01/27/2013   Past Surgical History:  Procedure Laterality Date   BACK SURGERY  02/17/2016   Dr. Leotis Shames- L4-L5 fusion    BIOPSY THYROID  2014   COLONOSCOPY     COLONOSCOPY W/ POLYPECTOMY     EYE SURGERY     bilateral cataract surgery w/ lens implant   FOOT SURGERY  2006   Right foot , secondary to severe loss of joint [facility])   POLYPECTOMY     TONSILLECTOMY     UPPER GASTROINTESTINAL ENDOSCOPY  2005   With empiric esophageal dilation   Patient Active Problem List   Diagnosis Date Noted   Mass of abdomen 05/23/2023   Urge incontinence 05/16/2022   Onychomycosis 08/17/2021   Drug allergy 06/29/2021   Fibromyalgia 06/29/2021   Generalized osteoarthritis 06/29/2021   Hand  joint pain 06/29/2021   Other intervertebral disc degeneration, lumbar region 06/29/2021   Other long term (current) drug therapy 06/29/2021   Sciatica 01/28/2020   Rhinitis, non-allergic 11/10/2019   Insomnia 08/10/2019   Anxiety 10/11/2018   Carotid artery stenosis, asymptomatic, bilateral 10/29/2016   ADD (attention deficit disorder) 10/29/2016   Spondylolisthesis of lumbar region 02/17/2016   Globus sensation since at least 2005 04/29/2015   GERD (gastroesophageal reflux disease) 11/17/2014   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   Travel advice encounter 01/27/2013   Multinodular goiter (nontoxic) 08/11/2012   Overweight (BMI 25.0-29.9) 08/11/2012   Psoriatic arthritis, destructive type (HCC)    Screening for cervical cancer 08/21/2011   Screening for breast cancer 08/21/2011   Hx of adenomatous polyp of colon 10/06/2008   Familial hyperlipidemia 09/15/2008   Major depressive disorder, single episode, in remission (HCC) 09/15/2008   ALLERGIC RHINITIS 09/15/2008    PCP: Dr. Duncan Dull   REFERRING PROVIDER: Duke Salvia NP   REFERRING DIAG: Spinal stenosis of lumbar region  Rationale for Evaluation and Treatment: Rehabilitation  THERAPY DIAG:  Other low back pain  Difficulty in walking, not elsewhere classified  Other lack  of coordination  Other abnormalities of gait and mobility  Sacrococcygeal disorders, not elsewhere classified  ONSET DATE: 2017   SUBJECTIVE:                                                                                                                                                                                           SUBJECTIVE STATEMENT: Pain pretty high today, pt thinks may have overdone it grocery shopping.    PERTINENT HISTORY:  Pt reports that she has three different types of arthritis: osteo, rheumatoid, and psoriatic. She had lumbar fusion in 2017 which she felt did not help significantly. She had an infusion for her psoriatic  arthritis this past spring and it caused her low back pain. She recently had a spinal injection and this resolved radiating pain on her LLE, but she still has excruciating, radiating pain down her right lower extremity. She feels unsteady most of the time and she has to use walker to keep from falling. Pt has also been told that she needs a reverse total shoulder because right shoulder RTC damage.   PAIN:  Are you having pain? 10/10; central lower thoracic spine.    PRECAUTIONS: None  RED FLAGS: Bowel or bladder incontinence: Yes: intermittent, Spinal tumors: No, Cauda equina syndrome: No, Compression fracture: No, and Abdominal aneurysm: No   WEIGHT BEARING RESTRICTIONS: No  FALLS:  Has patient fallen in last 6 months? Yes. Number of falls 1 fell on left side while on vacation at beach  PATIENT GOALS: To improve balance and to decrease   OBJECTIVE:    TODAY'S TREATMENT:                                                                                                                              DATE:   05/28/23: -Nustep, steat 6, arms 6, level 1 x 10 minutes  -STS from chair + airex pad x10, hands ad lib  -seated marching, alternating pattern 1x20, 2lb AW  -AMB overground c 2lb AW bilat, 242ft, using clinic rollator (1:45)   -STS from chair + airex pad  x10, hands ad lib  -seated marching, alternating pattern 1x20, 2lb AW  -AMB overground c 2lb AW bilat, 215ft, using clinic rollator (1:30)   -STS from chair + airex pad x10, hands ad lib  -seated marching, alternating pattern 1x30, 2lb AW (lost count while talking) -AMB overground c 2lb AW bilat, 2104ft, using clinic rollator   -lateral side stepping 2lb AW bilat x30, alteranting   ---------------------------------------------------------- 05/23/23: Nu Step with seat and arms at 6 with no resistance for 5 min  : 800 ft without single point cane  10 Meter Walk Test   Sit to Stand with 1 UE support 1 x 10 -min VC to    Eval: Education on single point cane: Place in strong side and step with opposite foot while placing cane down on ground at same time     PATIENT EDUCATION:  Education details: form and technique for correct performance of exercise. How to use SPC correctly and safely  Person educated: Patient Education method: Explanation, Demonstration, Verbal cues, and Handouts Education comprehension: verbalized understanding, returned demonstration, and verbal cues required  HOME EXERCISE PROGRAM: Purchase single point cane and practice gait pattern  Access Code: [email or web address] URL: [email or web address] Date: 05/23/2023 Prepared by: Ellin Goodie  Exercises - Sit to Stand  - 3-4 x weekly - 2 sets - 10 reps  ASSESSMENT:  CLINICAL IMPRESSION: Pt more limited by pain today from recent activity. Pt has begun to incorporate more SPC use in mobility when out of home. Started to advance program in strengthening legs and improving tolerance to mobility overground. She will continue to benefit from skilled PT to address these aforementioned deficits to improve patient's quality of life, reduce low back pain and improve function to participate more in physical activities.   OBJECTIVE IMPAIRMENTS: Abnormal gait, decreased balance, decreased endurance, decreased knowledge of use of DME, decreased mobility, difficulty walking, decreased ROM, decreased strength, hypomobility, impaired flexibility, impaired sensation, impaired UE functional use, and pain.   ACTIVITY LIMITATIONS: carrying, lifting, bending, standing, squatting, sleeping, stairs, bed mobility, bathing, toileting, dressing, reach over head, hygiene/grooming, and locomotion level  PARTICIPATION LIMITATIONS: cleaning, laundry, driving, shopping, community activity, and yard work  PERSONAL FACTORS: Past/current experiences, Time since onset of injury/illness/exacerbation, and 3+ comorbidities: psoriatic and rheumatoid arthritis,   are  also affecting patient's functional outcome.   REHAB POTENTIAL: Fair chronicity of low back and multiple co-morbidities  CLINICAL DECISION MAKING: Evolving/moderate complexity  EVALUATION COMPLEXITY: Moderate   GOALS: Goals reviewed with patient? No  SHORT TERM GOALS: Target date: 06/05/2023  Pt will be independent with HEP in order to improve strength and balance in order to decrease fall risk and improve function at home and work. Baseline: NT  Goal status: Ongoing   2.  Pt will increase by at least 0.13 m/s in order to demonstrate clinically significant improvement in community ambulation.  Baseline:  0.81 m/sec  Goal status: ONGOING   3.  Pt will increase by at least 80m (176ft) in order to demonstrate clinically significant improvement in cardiopulmonary endurance and community ambulation Baseline:  800 ft  Goal status: Ongoing   4.  Patient will show correct use of single point cane and rollator for improved ambulation and to decrease risk of falling.  Baseline: NT  Goal status: ONGOING    LONG TERM GOALS: Target date: 07/31/2023  Patient will have improved function and activity level as evidenced by an increase in FOTO score by 10 points or more.  Baseline: 40/100 with target  of 47  Goal status: ONGOING   2.  Patient will ambulate >=1000 ft for as evidence of improved aerobic endurance and lumbar function.  Baseline: 800 ft  Goal status: ONGOING   3. Patient will perform 10 meter walk test in >=1 m/sec for improved mobility and to decrease her risk of falling. Baseline: 0.81 m/sec  Goal status: INITIAL  4.  Patient will demonstrate an improvement in DGI score of >=2 pts as evidence of improved dynamic balance to decrease risk of falling (Pardasaney et al, 2012). Baseline: NT  Goal status: ONGOING   PLAN:  PT FREQUENCY: 1-2x/week  PT DURATION: 10 weeks  PLANNED INTERVENTIONS: Therapeutic exercises, Therapeutic activity, Neuromuscular  re-education, Balance training, Gait training, Patient/Family education, Self Care, Joint mobilization, Joint manipulation, Stair training, Vestibular training, Canalith repositioning, DME instructions, Aquatic Therapy, Dry Needling, Electrical stimulation, Spinal manipulation, Spinal mobilization, Cryotherapy, Moist heat, Traction, Ultrasound, Manual therapy, and Re-evaluation.  PLAN FOR NEXT SESSION:  DGI, rollator training, and progress LE strengthening and introduce balance exercises   4:07 PM, 05/28/23 Rosamaria Lints, PT, DPT Physical Therapist - [place] [phone] (Office)

## 2023-05-29 ENCOUNTER — Telehealth: Payer: Self-pay

## 2023-05-29 ENCOUNTER — Telehealth: Payer: Self-pay | Admitting: *Deleted

## 2023-05-29 ENCOUNTER — Ambulatory Visit
Admission: RE | Admit: 2023-05-29 | Discharge: 2023-05-29 | Disposition: A | Discharge status: Home / Self Care | Payer: Medicare HMO | Source: Ambulatory Visit | Attending: Internal Medicine | Admitting: Internal Medicine

## 2023-05-29 DIAGNOSIS — K5730 Diverticulosis of large intestine without perforation or abscess without bleeding: Secondary | ICD-10-CM | POA: Diagnosis not present

## 2023-05-29 DIAGNOSIS — R1905 Periumbilic swelling, mass or lump: Secondary | ICD-10-CM | POA: Diagnosis not present

## 2023-05-29 MED ORDER — IOHEXOL 300 MG/ML  SOLN
100.0000 mL | Freq: Once | INTRAMUSCULAR | Status: AC | PRN
  Administered 2023-05-29: 100 mL via INTRAVENOUS

## 2023-05-29 NOTE — Telephone Encounter (Signed)
Left voicemail for pt to return call to schedule a lab appt  to have sed rate redrawn. The last one was inaccurate & needed to be recollected. A credit was received & she was no charged for that test.  Please place pt on lab schedule anytime. Thanks

## 2023-05-29 NOTE — Telephone Encounter (Signed)
Received phone call from pt that she needs her CT scheduled for today at 2pm changed to stat.  She was encouraged by CT staff to have this test changed since she is experiencing pain.  Spoke with Dr. Darrick Huntsman who approved the change to stat.  Called imaging center and spoke with Carollee Herter who took the verbal to change to stat.  Called and notified pt that CT was changed to stat.

## 2023-06-01 ENCOUNTER — Telehealth: Payer: Self-pay | Admitting: Psychiatry

## 2023-06-01 NOTE — Telephone Encounter (Signed)
She should stop trusting the internet more than me.  I know how to get people off Cymbalta when necessary.  Prozac will not help pain and Cymbalta can.  Cymbalta is also a better antidepressant. But if she doesn't want to take Cymbalta it is her choice.

## 2023-06-01 NOTE — Telephone Encounter (Signed)
Patient lvm at 4:47 stating that she would like to speak with CC about a medication change. Please rtc to discuss [phone]

## 2023-06-01 NOTE — Telephone Encounter (Signed)
Patient said you prescribed Cymbalta at last appt. She said she filled it, but she is afraid to take it, is worried that she won't be able to get off of it, and has been reading about it. She said she increased her Prozac instead. She said her back pain is just too much, and even though she knows Cymbalta can help with pain she doesn't want to take it. Remembers being on it previously (2019/2020 at 30 mg). She reports crying all the time.

## 2023-06-03 ENCOUNTER — Other Ambulatory Visit: Payer: Self-pay | Admitting: Internal Medicine

## 2023-06-03 DIAGNOSIS — M19132 Post-traumatic osteoarthritis, left wrist: Secondary | ICD-10-CM

## 2023-06-04 ENCOUNTER — Ambulatory Visit: Payer: Medicare HMO | Admitting: Physical Therapy

## 2023-06-04 ENCOUNTER — Telehealth: Payer: Self-pay

## 2023-06-04 DIAGNOSIS — R2689 Other abnormalities of gait and mobility: Secondary | ICD-10-CM | POA: Diagnosis not present

## 2023-06-04 DIAGNOSIS — R262 Difficulty in walking, not elsewhere classified: Secondary | ICD-10-CM | POA: Diagnosis not present

## 2023-06-04 DIAGNOSIS — R278 Other lack of coordination: Secondary | ICD-10-CM | POA: Diagnosis not present

## 2023-06-04 DIAGNOSIS — M5459 Other low back pain: Secondary | ICD-10-CM | POA: Diagnosis not present

## 2023-06-04 DIAGNOSIS — M533 Sacrococcygeal disorders, not elsewhere classified: Secondary | ICD-10-CM | POA: Diagnosis not present

## 2023-06-04 NOTE — Telephone Encounter (Signed)
LVM to RC 

## 2023-06-04 NOTE — Therapy (Signed)
OUTPATIENT PHYSICAL THERAPY TREATMENT   Patient Name: Madeline Young MRN: [id number] DOB:10-02-1956, 67 y.o., female Today's Date: 06/04/2023  END OF SESSION:  PT End of Session - 06/04/23 1617     Visit Number 4    Number of Visits 20    Date for PT Re-Evaluation 07/30/23    Authorization Type [employer] 2024    Authorization Time Period 05/21/23-07/30/23    Authorization - Visit Number 4    Authorization - Number of Visits 20    Progress Note Due on Visit 10    PT Start Time 1605    PT Stop Time 1645    PT Time Calculation (min) 40 min    Activity Tolerance Patient limited by pain;No increased pain    Behavior During Therapy [facility] for tasks assessed/performed              Past Medical History:  Diagnosis Date   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   ADHD (attention deficit hyperactivity disorder)    Allergy    Anxiety    Arthritis    Cataract    bil cateracts removed   Complication of anesthesia    first colonoscopy pt states she woke up   Depression    GERD (gastroesophageal reflux disease)    Globus sensation    Hyperlipidemia    Incontinence    bowels at times   Insomnia    Multinodular goiter (nontoxic)    Psoriatic arthritis (HCC)    Spinal stenosis of lumbar region 03/10/2015   MRI    Statin intolerance 01/27/2013   Past Surgical History:  Procedure Laterality Date   BACK SURGERY  02/17/2016   Dr. Leotis Shames- L4-L5 fusion    BIOPSY THYROID  2014   COLONOSCOPY     COLONOSCOPY W/ POLYPECTOMY     EYE SURGERY     bilateral cataract surgery w/ lens implant   FOOT SURGERY  2006   Right foot , secondary to severe loss of joint [facility])   POLYPECTOMY     TONSILLECTOMY     UPPER GASTROINTESTINAL ENDOSCOPY  2005   With empiric esophageal dilation   Patient Active Problem List   Diagnosis Date Noted   Slac (scapholunate advanced collapse) of wrist, left 06/03/2023   Mass of abdomen 05/23/2023   Urge incontinence 05/16/2022   Onychomycosis  08/17/2021   Drug allergy 06/29/2021   Fibromyalgia 06/29/2021   Generalized osteoarthritis 06/29/2021   Hand joint pain 06/29/2021   Other intervertebral disc degeneration, lumbar region 06/29/2021   Other long term (current) drug therapy 06/29/2021   Sciatica 01/28/2020   Rhinitis, non-allergic 11/10/2019   Insomnia 08/10/2019   Anxiety 10/11/2018   Carotid artery stenosis, asymptomatic, bilateral 10/29/2016   ADD (attention deficit disorder) 10/29/2016   Spondylolisthesis of lumbar region 02/17/2016   Globus sensation since at least 2005 04/29/2015   GERD (gastroesophageal reflux disease) 11/17/2014   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   Travel advice encounter 01/27/2013   Multinodular goiter (nontoxic) 08/11/2012   Overweight (BMI 25.0-29.9) 08/11/2012   Psoriatic arthritis, destructive type (HCC)    Screening for cervical cancer 08/21/2011   Screening for breast cancer 08/21/2011   Hx of adenomatous polyp of colon 10/06/2008   Familial hyperlipidemia 09/15/2008   Major depressive disorder, single episode, in remission (HCC) 09/15/2008   ALLERGIC RHINITIS 09/15/2008    PCP: Dr. Duncan Dull   REFERRING PROVIDER: Duke Salvia NP   REFERRING DIAG: Spinal stenosis of lumbar region  Rationale for Evaluation and  Treatment: Rehabilitation  THERAPY DIAG:  Other low back pain  Difficulty in walking, not elsewhere classified  ONSET DATE: 2017   SUBJECTIVE:                                                                                                                                                                                           SUBJECTIVE STATEMENT: Pain pretty high today, pt thinks may have overdone it grocery shopping.    PERTINENT HISTORY:  Pt reports that she has three different types of arthritis: osteo, rheumatoid, and psoriatic. She had lumbar fusion in 2017 which she felt did not help significantly. She had an infusion for her psoriatic arthritis this past  spring and it caused her low back pain. She recently had a spinal injection and this resolved radiating pain on her LLE, but she still has excruciating, radiating pain down her right lower extremity. She feels unsteady most of the time and she has to use walker to keep from falling. Pt has also been told that she needs a reverse total shoulder because right shoulder RTC damage.   PAIN:  Are you having pain? 10/10; central lower thoracic spine.    PRECAUTIONS: None  RED FLAGS: Bowel or bladder incontinence: Yes: intermittent, Spinal tumors: No, Cauda equina syndrome: No, Compression fracture: No, and Abdominal aneurysm: No   WEIGHT BEARING RESTRICTIONS: No  FALLS:  Has patient fallen in last 6 months? Yes. Number of falls 1 fell on left side while on vacation at beach  PATIENT GOALS: To improve balance and to decrease   OBJECTIVE:              Kathlene Cote, MD - 09/14/2022  Formatting of this note might be different from the original.  EXAM: XR HIP W PELVIS RIGHT  DATE: 09/14/2022 2:35 PM  ACCESSION: [id number]  DICTATED: 09/14/2022 9:19 PM  INTERPRETATION LOCATION: [facility]   CLINICAL INDICATION: 67 years old Female with Eval right hip pain  - M16.0 - Arthritis of both hips    COMPARISON: None.   TECHNIQUE:  AP and lateral views of the right hip. AP views of the pelvis    FINDINGS:  No acute fracture or malalignment of the right hip. Diffuse demineralization obscure nondisplaced fractures.   Moderate osteoarthritis of both hips, left greater than right, with diffuse joint space loss, subchondral sclerosis and small marginal osteophytes.   Degenerative changes of the bilateral SI joints and symphysis pubis. Lower lumbar fusion hardware, suboptimally assessed.   IMPRESSION:   1. No fracture or malalignment. Diffuse demineralization may obscure nondisplaced fractures. If there is clinical concern for radiographically occult  fracture, MRI of the pelvis is recommended  for further evaluation.  2. Moderate osteoarthritis of both hips.    CLINICAL DATA:  Chronic, progressively worsening low back pain, worse on the right. History of prior fusion in 2017.   EXAM: LUMBAR MYELOGRAM   CT LUMBAR MYELOGRAM   FLUOROSCOPY: Radiation Exposure Index (as provided by the fluoroscopic device): 19.3 mGy Kerma   PROCEDURE: After thorough discussion of risks and benefits of the procedure including bleeding, infection, injury to nerves, blood vessels, adjacent structures as well as headache and CSF leak, written and oral informed consent was obtained. Consent was obtained by Dr. Malachi Pro. Time out form was completed.   Patient was positioned prone on the fluoroscopy table. Local anesthesia was provided with 1% lidocaine without epinephrine after prepped and draped in the usual sterile fashion. Puncture was performed at L1-L2 using a 3 1/2 inch 22-gauge spinal needle via right interlaminar approach. Using a single pass through the dura, the needle was placed within the thecal sac, with return of clear CSF. 15 mL of Isovue M-200 was injected into the thecal sac, with normal opacification of the nerve roots and cauda equina consistent with free flow within the subarachnoid space.   I personally performed the lumbar puncture and administered the intrathecal contrast. I also personally supervised acquisition of the myelogram images.   TECHNIQUE: Contiguous axial images were obtained through the lumbar spine after the intrathecal infusion of contrast. Coronal and sagittal reconstructions were obtained of the axial image sets.   COMPARISON:  Lumbar spine x-rays dated August 13, 2018. CT lumbar myelogram dated July 18, 2017.   FINDINGS: LUMBAR MYELOGRAM FINDINGS:   Prior L4-S1 PLIF. Hardware is intact. Progressive dextroscoliosis. Chronic fused mild anterolisthesis at L4-L5 and L5-S1. No dynamic instability.   Small posterior disc osteophyte  complexes from L1-L2 through L3-L4. Mild spinal canal stenosis at L1-L2 and L2-L3. Mild-to-moderate spinal canal stenosis at L3-L4.   CT LUMBAR MYELOGRAM FINDINGS:   Segmentation: Standard.   Alignment: Progressive dextroscoliosis. Unchanged mild fused anterolisthesis at L4-L5 and L5-S1.   Vertebrae: Acute nondisplaced fracture of the right L1 transverse process. Chronic mild T12 inferior endplate compression deformity, new since 2018. Prior L4-S1 PLIF with interval solid interbody and posterior osseous fusion. Unchanged mild lucency surrounding the bilateral S1 pedicle screws.   Conus medullaris and cauda equina: Conus extends to the L1-L2 level. Conus and cauda equina appear normal.   Paraspinal and other soft tissues: Aortoiliac atherosclerotic vascular disease.   Disc levels:   T11-T12: Mild disc bulging and bilateral facet arthropathy. Mild right neuroforaminal stenosis. No spinal canal or left neuroforaminal stenosis.   T12-L1: Progressive diffuse disc bulging eccentric to the right. Large right subarticular disc extrusion/extruded disc fragment migrating inferiorly posterior to the L1 vertebral body. Smaller left subarticular extruded disc material with vacuum phenomenon posterior to the L1 vertebral body, potentially arising from the T12-L1 or L1-L2 disc. Large right far lateral disc extrusion migrating superiorly. Mild bilateral facet arthropathy. Mild-to-moderate spinal canal and right lateral recess stenosis. Mild left lateral recess stenosis. Mild right neuroforaminal stenosis. No left neuroforaminal stenosis. Findings have progressed since 2018.   L1-L2: Small circumferential disc osteophyte complex moderate left and mild right facet arthropathy. Mild-to-moderate left lateral recess stenosis. Mild bilateral neuroforaminal stenosis. No spinal canal stenosis. Findings have progressed since 2018.   L2-L3: Mild disc bulging and endplate spurring eccentric to  the left. Small amount of air in the left inferior subarticular base posterior to the L3 vertebral body,  presumably within extruded disc. Moderate to severe left and mild right facet arthropathy. Mild left lateral recess and neuroforaminal stenosis. No spinal canal or right neuroforaminal stenosis. Findings have progressed since 2018.   L3-L4: Circumferential disc osteophyte complex with severe right and moderate to severe left facet arthropathy. Moderate spinal canal stenosis. Mild to moderate right and mild left lateral recess stenosis. Moderate left and mild right neuroforaminal stenosis. Findings have progressed since 2018.   L4-L5: Prior posterior decompression and PLIF. No residual stenosis.   L5-S1: Prior posterior decompression and PLIF. No residual stenosis.   IMPRESSION: 1. Prior L4-S1 PLIF with interval solid interbody and posterior osseous fusion. Unchanged mild lucency surrounding the bilateral S1 pedicle screws, concerning for loosening. No residual stenosis. 2. Progressive multilevel lumbar spondylosis as described above. Mild-to-moderate spinal canal and right lateral recess stenosis at T12-L1. Moderate spinal canal and left neuroforaminal stenosis at L3-L4. 3. Acute nondisplaced fracture of the right L1 transverse process. 4. Chronic mild T12 inferior endplate compression deformity, new since 2018. 5.  Aortic Atherosclerosis (ICD10-I70.0).     Electronically Signed   By: Obie Dredge M.D.   On: 02/15/2023 14:26  PATIENT SURVEYS:  FOTO 40/100 with target 47    SCREENING FOR RED FLAGS: Bowel or bladder incontinence: Yes: Intermittent  Spinal tumors: No Cauda equina syndrome: No Compression fracture: No Abdominal aneurysm: No   COGNITION: Overall cognitive status: Within functional limits for tasks assessed                          SENSATION: Light touch: Impaired   Numbness and tingling in left quad    MUSCLE LENGTH: None tested      POSTURE:  rounded shoulders   PALPATION: L1-L5 TTP    LUMBAR ROM:    AROM eval  Flexion 100%  Extension 50%*  Right lateral flexion 100%  Left lateral flexion 100%  Right rotation 100%  Left rotation 100%   (Blank rows = not tested)   LOWER EXTREMITY ROM:      Active  Right eval Left eval  Hip flexion      Hip extension      Hip abduction      Hip adduction      Hip internal rotation      Hip external rotation      Knee flexion      Knee extension      Ankle dorsiflexion      Ankle plantarflexion      Ankle inversion      Ankle eversion       (Blank rows = not tested)   LOWER EXTREMITY MMT:     MMT Right eval Left eval  Hip flexion 4 4  Hip extension NT  NT   Hip abduction NT  NT   Hip adduction      Hip internal rotation      Hip external rotation      Knee flexion 4- 4-  Knee extension 4- 4-  Ankle dorsiflexion 4- 4-  Ankle plantarflexion      Ankle inversion      Ankle eversion       (Blank rows = not tested)   LUMBAR SPECIAL TESTS: None tested    FUNCTIONAL TESTS:  5 times sit to stand: 0 reps  30 seconds chair stand test: 0 reps  6 minute walk test: 800 ft  10 meter walk test: *   GAIT: Distance walked: 50  ft  Assistive device utilized: None Level of assistance: Complete Independence Comments: Bilateral antalgic gait with decreased step length and stance time   TODAY'S TREATMENT:                                                                                                                              DATE:   06/04/23: Nustep, seat and arms at 6 with resistance at 3 for 5 min   DGA: 14/24 -Moderate Impairment: Gait level surface, Head Turns , Step Over Obstacle (-8) -Mild Impairment: Change in gait speed, Gait with pivot turns (-2)  Sit to Stand for 18 inch mat height 3 x 10  -min VC to decrease lumbar extension   Pt already has rollator  NMR     Figure 8 cone walks with cones spaced 5 ft apart x 10  Ambulation with Head Turns 10 m x 6  Ad lib with UE support    05/28/23: -Nustep, steat 6, arms 6, level 1 x 10 minutes  -STS from chair + airex pad x10, hands ad lib  -seated marching, alternating pattern 1x20, 2lb AW  -AMB overground c 2lb AW bilat, 239ft, using clinic rollator (1:45)   -STS from chair + airex pad x10, hands ad lib  -seated marching, alternating pattern 1x20, 2lb AW  -AMB overground c 2lb AW bilat, 21ft, using clinic rollator (1:30)   -STS from chair + airex pad x10, hands ad lib  -seated marching, alternating pattern 1x30, 2lb AW (lost count while talking) -AMB overground c 2lb AW bilat, 217ft, using clinic rollator   -lateral side stepping 2lb AW bilat x30, alteranting   ---------------------------------------------------------- 05/23/23: Nu Step with seat and arms at 6 with no resistance for 5 min  : 800 ft without single point cane  10 Meter Walk Test   Sit to Stand with 1 UE support 1 x 10 -min VC to   Eval: Education on single point cane: Place in strong side and step with opposite foot while placing cane down on ground at same time     PATIENT EDUCATION:  Education details: form and technique for correct performance of exercise. How to use SPC correctly and safely  Person educated: Patient Education method: Explanation, Demonstration, Verbal cues, and Handouts Education comprehension: verbalized understanding, returned demonstration, and verbal cues required  HOME EXERCISE PROGRAM: Access Code: [email or web address] URL: [email or web address] Date: 06/04/2023 Prepared by: Ellin Goodie  Exercises - Sit to Stand  - 3-4 x weekly - 2 sets - 10 reps - Walking with Head Nod  - 3-4 x weekly - 3 sets - 10 reps  ASSESSMENT:  CLINICAL IMPRESSION: Pt presents at increased risk for falls especially with stepping over obstacles and performing head turns. Despite initial elevated pain level, pt was able to perform all standing strengthening and balance exercises without an increase  in her pain with minor flare of her lumbar stenosis. She will continue to benefit from skilled PT to  address these aforementioned deficits to improve patient's quality of life, reduce low back pain and improve function to participate more in physical activities.    OBJECTIVE IMPAIRMENTS: Abnormal gait, decreased balance, decreased endurance, decreased knowledge of use of DME, decreased mobility, difficulty walking, decreased ROM, decreased strength, hypomobility, impaired flexibility, impaired sensation, impaired UE functional use, and pain.   ACTIVITY LIMITATIONS: carrying, lifting, bending, standing, squatting, sleeping, stairs, bed mobility, bathing, toileting, dressing, reach over head, hygiene/grooming, and locomotion level  PARTICIPATION LIMITATIONS: cleaning, laundry, driving, shopping, community activity, and yard work  PERSONAL FACTORS: Past/current experiences, Time since onset of injury/illness/exacerbation, and 3+ comorbidities: psoriatic and rheumatoid arthritis,   are also affecting patient's functional outcome.   REHAB POTENTIAL: Fair chronicity of low back and multiple co-morbidities  CLINICAL DECISION MAKING: Evolving/moderate complexity  EVALUATION COMPLEXITY: Moderate   GOALS: Goals reviewed with patient? No  SHORT TERM GOALS: Target date: 06/05/2023  Pt will be independent with HEP in order to improve strength and balance in order to decrease fall risk and improve function at home and work. Baseline: NT  Goal status: Ongoing   2.  Pt will increase by at least 0.13 m/s in order to demonstrate clinically significant improvement in community ambulation.  Baseline:  0.81 m/sec  Goal status: ONGOING   3.  Pt will increase by at least 29m (11ft) in order to demonstrate clinically significant improvement in cardiopulmonary endurance and community ambulation Baseline:  800 ft  Goal status: Ongoing   4.  Patient will show correct use of single point cane and  rollator for improved ambulation and to decrease risk of falling.  Baseline: NT 06/04/23: Using SPC  Goal status: ACHIEVED    LONG TERM GOALS: Target date: 07/31/2023  Patient will have improved function and activity level as evidenced by an increase in FOTO score by 10 points or more.  Baseline: 40/100 with target of 47  Goal status: ONGOING   2.  Patient will ambulate >=1000 ft for as evidence of improved aerobic endurance and lumbar function.  Baseline: 800 ft  Goal status: ONGOING   3. Patient will perform 10 meter walk test in >=1 m/sec for improved mobility and to decrease her risk of falling. Baseline: 0.81 m/sec  Goal status: ONGOING   4.  Patient will demonstrate an improvement in DGI score of >=2 pts as evidence of improved dynamic balance to decrease risk of falling (Pardasaney et al, 2012). Baseline: 14/24 Goal status: ONGOING   PLAN:  PT FREQUENCY: 1-2x/week  PT DURATION: 10 weeks  PLANNED INTERVENTIONS: Therapeutic exercises, Therapeutic activity, Neuromuscular re-education, Balance training, Gait training, Patient/Family education, Self Care, Joint mobilization, Joint manipulation, Stair training, Vestibular training, Canalith repositioning, DME instructions, Aquatic Therapy, Dry Needling, Electrical stimulation, Spinal manipulation, Spinal mobilization, Cryotherapy, Moist heat, Traction, Ultrasound, Manual therapy, and Re-evaluation.  PLAN FOR NEXT SESSION:  Progress dynamic balance exercises: Ankle weight step overs. Standing marches    Ellin Goodie PT, DPT  [facility] [address] [place], [address] Phone: [phone]   Fax:  [phone]

## 2023-06-04 NOTE — Telephone Encounter (Signed)
LMTCB in regards to results.  °

## 2023-06-04 NOTE — Telephone Encounter (Signed)
Patient just called returning phone call. I let her know someone will call her back.

## 2023-06-04 NOTE — Telephone Encounter (Signed)
See result note message 

## 2023-06-05 ENCOUNTER — Ambulatory Visit: Payer: Medicare HMO | Admitting: Physical Therapy

## 2023-06-05 DIAGNOSIS — R262 Difficulty in walking, not elsewhere classified: Secondary | ICD-10-CM | POA: Diagnosis not present

## 2023-06-05 DIAGNOSIS — M5459 Other low back pain: Secondary | ICD-10-CM | POA: Diagnosis not present

## 2023-06-05 DIAGNOSIS — R2689 Other abnormalities of gait and mobility: Secondary | ICD-10-CM | POA: Diagnosis not present

## 2023-06-05 DIAGNOSIS — R278 Other lack of coordination: Secondary | ICD-10-CM | POA: Diagnosis not present

## 2023-06-05 DIAGNOSIS — M533 Sacrococcygeal disorders, not elsewhere classified: Secondary | ICD-10-CM | POA: Diagnosis not present
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
## 2023-06-05 NOTE — Therapy (Signed)
OUTPATIENT PHYSICAL THERAPY TREATMENT   Patient Name: Madeline Young MRN: [id number] DOB:[birth year], 67 y.o., female Today's Date: 06/05/2023  END OF SESSION:  PT End of Session - 06/05/23 1028     Visit Number 5    Number of Visits 20    Date for PT Re-Evaluation 07/30/23    Authorization Type [employer] 2024    Authorization Time Period 05/21/23-07/30/23    Authorization - Visit Number 5    Authorization - Number of Visits 20    Progress Note Due on Visit 10    PT Start Time 0950    PT Stop Time 1030    PT Time Calculation (min) 40 min    Activity Tolerance Patient tolerated treatment well;No increased pain    Behavior During Therapy [facility] for tasks assessed/performed               Past Medical History:  Diagnosis Date   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   ADHD (attention deficit hyperactivity disorder)    Allergy    Anxiety    Arthritis    Cataract    bil cateracts removed   Complication of anesthesia    first colonoscopy pt states she woke up   Depression    GERD (gastroesophageal reflux disease)    Globus sensation    Hyperlipidemia    Incontinence    bowels at times   Insomnia    Multinodular goiter (nontoxic)    Psoriatic arthritis (HCC)    Spinal stenosis of lumbar region 03/10/2015   MRI    Statin intolerance 01/27/2013   Past Surgical History:  Procedure Laterality Date   BACK SURGERY  02/17/2016   Dr. Leotis Shames- L4-L5 fusion    BIOPSY THYROID  2014   COLONOSCOPY     COLONOSCOPY W/ POLYPECTOMY     EYE SURGERY     bilateral cataract surgery w/ lens implant   FOOT SURGERY  2006   Right foot , secondary to severe loss of joint [facility])   POLYPECTOMY     TONSILLECTOMY     UPPER GASTROINTESTINAL ENDOSCOPY  2005   With empiric esophageal dilation   Patient Active Problem List   Diagnosis Date Noted   Slac (scapholunate advanced collapse) of wrist, left 06/03/2023   Mass of abdomen 05/23/2023   Urge incontinence 05/16/2022    Onychomycosis 08/17/2021   Drug allergy 06/29/2021   Fibromyalgia 06/29/2021   Generalized osteoarthritis 06/29/2021   Hand joint pain 06/29/2021   Other intervertebral disc degeneration, lumbar region 06/29/2021   Other long term (current) drug therapy 06/29/2021   Sciatica 01/28/2020   Rhinitis, non-allergic 11/10/2019   Insomnia 08/10/2019   Anxiety 10/11/2018   Carotid artery stenosis, asymptomatic, bilateral 10/29/2016   ADD (attention deficit disorder) 10/29/2016   Spondylolisthesis of lumbar region 02/17/2016   Globus sensation since at least 2005 04/29/2015   GERD (gastroesophageal reflux disease) 11/17/2014   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   Travel advice encounter 01/27/2013   Multinodular goiter (nontoxic) 08/11/2012   Overweight (BMI 25.0-29.9) 08/11/2012   Psoriatic arthritis, destructive type (HCC)    Screening for cervical cancer 08/21/2011   Screening for breast cancer 08/21/2011   Hx of adenomatous polyp of colon 10/06/2008   Familial hyperlipidemia 09/15/2008   Major depressive disorder, single episode, in remission (HCC) 09/15/2008   ALLERGIC RHINITIS 09/15/2008    PCP: Dr. Duncan Dull   REFERRING PROVIDER: Duke Salvia NP   REFERRING DIAG: Spinal stenosis of lumbar region  Rationale for Evaluation  and Treatment: Rehabilitation  THERAPY DIAG:  Other low back pain  Difficulty in walking, not elsewhere classified  ONSET DATE: 2017   SUBJECTIVE:                                                                                                                                                                                           SUBJECTIVE STATEMENT: Pt reports feeling increased low back pain after last session that kept her up all night. She is not sure what caused it but it might have been her overdoing it.     PERTINENT HISTORY:  Pt reports that she has three different types of arthritis: osteo, rheumatoid, and psoriatic. She had lumbar fusion in 2017  which she felt did not help significantly. She had an infusion for her psoriatic arthritis this past spring and it caused her low back pain. She recently had a spinal injection and this resolved radiating pain on her LLE, but she still has excruciating, radiating pain down her right lower extremity. She feels unsteady most of the time and she has to use walker to keep from falling. Pt has also been told that she needs a reverse total shoulder because right shoulder RTC damage.   PAIN:  Are you having pain? 7/10; central lower thoracic spine.    PRECAUTIONS: None  RED FLAGS: Bowel or bladder incontinence: Yes: intermittent, Spinal tumors: No, Cauda equina syndrome: No, Compression fracture: No, and Abdominal aneurysm: No   WEIGHT BEARING RESTRICTIONS: No  FALLS:  Has patient fallen in last 6 months? Yes. Number of falls 1 fell on left side while on vacation at beach  PATIENT GOALS: To improve balance and to decrease   OBJECTIVE:              Kathlene Cote, MD - 09/14/2022  Formatting of this note might be different from the original.  EXAM: XR HIP W PELVIS RIGHT  DATE: 09/14/2022 2:35 PM  ACCESSION: [id number]  DICTATED: 09/14/2022 9:19 PM  INTERPRETATION LOCATION: [facility]   CLINICAL INDICATION: 67 years old Female with Eval right hip pain  - M16.0 - Arthritis of both hips    COMPARISON: None.   TECHNIQUE:  AP and lateral views of the right hip. AP views of the pelvis    FINDINGS:  No acute fracture or malalignment of the right hip. Diffuse demineralization obscure nondisplaced fractures.   Moderate osteoarthritis of both hips, left greater than right, with diffuse joint space loss, subchondral sclerosis and small marginal osteophytes.   Degenerative changes of the bilateral SI joints and symphysis pubis. Lower lumbar fusion hardware, suboptimally assessed.   IMPRESSION:  1. No fracture or malalignment. Diffuse demineralization may obscure nondisplaced fractures.  If there is clinical concern for radiographically occult fracture, MRI of the pelvis is recommended for further evaluation.  2. Moderate osteoarthritis of both hips.    CLINICAL DATA:  Chronic, progressively worsening low back pain, worse on the right. History of prior fusion in 2017.   EXAM: LUMBAR MYELOGRAM   CT LUMBAR MYELOGRAM   FLUOROSCOPY: Radiation Exposure Index (as provided by the fluoroscopic device): 19.3 mGy Kerma   PROCEDURE: After thorough discussion of risks and benefits of the procedure including bleeding, infection, injury to nerves, blood vessels, adjacent structures as well as headache and CSF leak, written and oral informed consent was obtained. Consent was obtained by Dr. Malachi Pro. Time out form was completed.   Patient was positioned prone on the fluoroscopy table. Local anesthesia was provided with 1% lidocaine without epinephrine after prepped and draped in the usual sterile fashion. Puncture was performed at L1-L2 using a 3 1/2 inch 22-gauge spinal needle via right interlaminar approach. Using a single pass through the dura, the needle was placed within the thecal sac, with return of clear CSF. 15 mL of Isovue M-200 was injected into the thecal sac, with normal opacification of the nerve roots and cauda equina consistent with free flow within the subarachnoid space.   I personally performed the lumbar puncture and administered the intrathecal contrast. I also personally supervised acquisition of the myelogram images.   TECHNIQUE: Contiguous axial images were obtained through the lumbar spine after the intrathecal infusion of contrast. Coronal and sagittal reconstructions were obtained of the axial image sets.   COMPARISON:  Lumbar spine x-rays dated August 13, 2018. CT lumbar myelogram dated July 18, 2017.   FINDINGS: LUMBAR MYELOGRAM FINDINGS:   Prior L4-S1 PLIF. Hardware is intact. Progressive dextroscoliosis. Chronic fused mild  anterolisthesis at L4-L5 and L5-S1. No dynamic instability.   Small posterior disc osteophyte complexes from L1-L2 through L3-L4. Mild spinal canal stenosis at L1-L2 and L2-L3. Mild-to-moderate spinal canal stenosis at L3-L4.   CT LUMBAR MYELOGRAM FINDINGS:   Segmentation: Standard.   Alignment: Progressive dextroscoliosis. Unchanged mild fused anterolisthesis at L4-L5 and L5-S1.   Vertebrae: Acute nondisplaced fracture of the right L1 transverse process. Chronic mild T12 inferior endplate compression deformity, new since 2018. Prior L4-S1 PLIF with interval solid interbody and posterior osseous fusion. Unchanged mild lucency surrounding the bilateral S1 pedicle screws.   Conus medullaris and cauda equina: Conus extends to the L1-L2 level. Conus and cauda equina appear normal.   Paraspinal and other soft tissues: Aortoiliac atherosclerotic vascular disease.   Disc levels:   T11-T12: Mild disc bulging and bilateral facet arthropathy. Mild right neuroforaminal stenosis. No spinal canal or left neuroforaminal stenosis.   T12-L1: Progressive diffuse disc bulging eccentric to the right. Large right subarticular disc extrusion/extruded disc fragment migrating inferiorly posterior to the L1 vertebral body. Smaller left subarticular extruded disc material with vacuum phenomenon posterior to the L1 vertebral body, potentially arising from the T12-L1 or L1-L2 disc. Large right far lateral disc extrusion migrating superiorly. Mild bilateral facet arthropathy. Mild-to-moderate spinal canal and right lateral recess stenosis. Mild left lateral recess stenosis. Mild right neuroforaminal stenosis. No left neuroforaminal stenosis. Findings have progressed since 2018.   L1-L2: Small circumferential disc osteophyte complex moderate left and mild right facet arthropathy. Mild-to-moderate left lateral recess stenosis. Mild bilateral neuroforaminal stenosis. No spinal canal stenosis.  Findings have progressed since 2018.   L2-L3: Mild disc bulging and endplate spurring eccentric  to the left. Small amount of air in the left inferior subarticular base posterior to the L3 vertebral body, presumably within extruded disc. Moderate to severe left and mild right facet arthropathy. Mild left lateral recess and neuroforaminal stenosis. No spinal canal or right neuroforaminal stenosis. Findings have progressed since 2018.   L3-L4: Circumferential disc osteophyte complex with severe right and moderate to severe left facet arthropathy. Moderate spinal canal stenosis. Mild to moderate right and mild left lateral recess stenosis. Moderate left and mild right neuroforaminal stenosis. Findings have progressed since 2018.   L4-L5: Prior posterior decompression and PLIF. No residual stenosis.   L5-S1: Prior posterior decompression and PLIF. No residual stenosis.   IMPRESSION: 1. Prior L4-S1 PLIF with interval solid interbody and posterior osseous fusion. Unchanged mild lucency surrounding the bilateral S1 pedicle screws, concerning for loosening. No residual stenosis. 2. Progressive multilevel lumbar spondylosis as described above. Mild-to-moderate spinal canal and right lateral recess stenosis at T12-L1. Moderate spinal canal and left neuroforaminal stenosis at L3-L4. 3. Acute nondisplaced fracture of the right L1 transverse process. 4. Chronic mild T12 inferior endplate compression deformity, new since 2018. 5.  Aortic Atherosclerosis (ICD10-I70.0).     Electronically Signed   By: Obie Dredge M.D.   On: 02/15/2023 14:26  PATIENT SURVEYS:  FOTO 40/100 with target 47    SCREENING FOR RED FLAGS: Bowel or bladder incontinence: Yes: Intermittent  Spinal tumors: No Cauda equina syndrome: No Compression fracture: No Abdominal aneurysm: No   COGNITION: Overall cognitive status: Within functional limits for tasks assessed                          SENSATION: Light  touch: Impaired   Numbness and tingling in left quad    MUSCLE LENGTH: None tested      POSTURE: rounded shoulders   PALPATION: L1-L5 TTP    LUMBAR ROM:    AROM eval  Flexion 100%  Extension 50%*  Right lateral flexion 100%  Left lateral flexion 100%  Right rotation 100%  Left rotation 100%   (Blank rows = not tested)   LOWER EXTREMITY ROM:      Active  Right eval Left eval  Hip flexion      Hip extension      Hip abduction      Hip adduction      Hip internal rotation      Hip external rotation      Knee flexion      Knee extension      Ankle dorsiflexion      Ankle plantarflexion      Ankle inversion      Ankle eversion       (Blank rows = not tested)   LOWER EXTREMITY MMT:     MMT Right eval Left eval  Hip flexion 4 4  Hip extension NT  NT   Hip abduction NT  NT   Hip adduction      Hip internal rotation      Hip external rotation      Knee flexion 4- 4-  Knee extension 4- 4-  Ankle dorsiflexion 4- 4-  Ankle plantarflexion      Ankle inversion      Ankle eversion       (Blank rows = not tested)   LUMBAR SPECIAL TESTS: None tested    FUNCTIONAL TESTS:  5 times sit to stand: 0 reps  30 seconds chair stand test: 0 reps  6 minute walk test: 800 ft  10 meter walk test: *   GAIT: Distance walked: 50 ft  Assistive device utilized: None Level of assistance: Complete Independence Comments: Bilateral antalgic gait with decreased step length and stance time   TODAY'S TREATMENT:                                                                                                                              DATE:   06/05/23: THEREX  Discussion about [place] and pt preference for this type of PT.  Standing Marches 1 x 10  Standing Marches with #2 AW and BUE support  1 x 10  Standing Marches with #3 AW and BUE support 1 x 10  Standing Marches with #3 AW and 1 UE support 1 x 10   NMR  Modified single leg stance on 6 inch block 4 x 30 sec  Modified  single leg stance on 6 inch cone 4 x 30 sec  Toe Taps on 3 inch yoga block 2 x 10  Toe Taps on 6 inch step 2 x 10   06/04/23: Nustep, seat and arms at 6 with resistance at 3 for 5 min   DGA: 14/24 -Moderate Impairment: Gait level surface, Head Turns , Step Over Obstacle (-8) -Mild Impairment: Change in gait speed, Gait with pivot turns (-2)  Sit to Stand for 18 inch mat height 3 x 10  -min VC to decrease lumbar extension   Pt already has rollator  NMR     Figure 8 cone walks with cones spaced 5 ft apart x 10  Ambulation with Head Turns 10 m x 6 Ad lib with UE support    05/28/23: -Nustep, steat 6, arms 6, level 1 x 10 minutes  -STS from chair + airex pad x10, hands ad lib  -seated marching, alternating pattern 1x20, 2lb AW  -AMB overground c 2lb AW bilat, 216ft, using clinic rollator (1:45)   -STS from chair + airex pad x10, hands ad lib  -seated marching, alternating pattern 1x20, 2lb AW  -AMB overground c 2lb AW bilat, 262ft, using clinic rollator (1:30)   -STS from chair + airex pad x10, hands ad lib  -seated marching, alternating pattern 1x30, 2lb AW (lost count while talking) -AMB overground c 2lb AW bilat, 229ft, using clinic rollator   -lateral side stepping 2lb AW bilat x30, alteranting   ---------------------------------------------------------- 05/23/23: Nu Step with seat and arms at 6 with no resistance for 5 min  : 800 ft without single point cane  10 Meter Walk Test   Sit to Stand with 1 UE support 1 x 10 -min VC to   Eval: Education on single point cane: Place in strong side and step with opposite foot while placing cane down on ground at same time     PATIENT EDUCATION:  Education details: form and technique for correct performance of exercise. How to use [facility] correctly and safely  Person  educated: Patient Education method: Programmer, multimedia, Demonstration, Verbal cues, and Handouts Education comprehension: verbalized understanding, returned  demonstration, and verbal cues required  HOME EXERCISE PROGRAM: Access Code: [email or web address] URL: [email or web address] Date: 06/05/2023 Prepared by: Ellin Goodie  Exercises - Sit to Stand  - 3-4 x weekly - 2 sets - 10 reps - Walking with Head Nod  - 3-4 x weekly - 3 sets - 10 reps - Standing March with Counter Support  - 3-4 x weekly - 3 sets - 10 reps - Standing Toe Taps  - 3-4 x weekly - 3 sets - 10 reps  ASSESSMENT:  CLINICAL IMPRESSION: Pt shows improved pain response during session with ability to perform standing exercises without an increase her pain. Pt continues to show pain provocation with lumbar extension due to lumbar stenosis with one or two episodes of pain flares and BLE leg spasms.She will continue to benefit from skilled PT to address these aforementioned deficits to improve patient's quality of life, reduce low back pain and improve function to participate more in physical activities.    OBJECTIVE IMPAIRMENTS: Abnormal gait, decreased balance, decreased endurance, decreased knowledge of use of DME, decreased mobility, difficulty walking, decreased ROM, decreased strength, hypomobility, impaired flexibility, impaired sensation, impaired UE functional use, and pain.   ACTIVITY LIMITATIONS: carrying, lifting, bending, standing, squatting, sleeping, stairs, bed mobility, bathing, toileting, dressing, reach over head, hygiene/grooming, and locomotion level  PARTICIPATION LIMITATIONS: cleaning, laundry, driving, shopping, community activity, and yard work  PERSONAL FACTORS: Past/current experiences, Time since onset of injury/illness/exacerbation, and 3+ comorbidities: psoriatic and rheumatoid arthritis,   are also affecting patient's functional outcome.   REHAB POTENTIAL: Fair chronicity of low back and multiple co-morbidities  CLINICAL DECISION MAKING: Evolving/moderate complexity  EVALUATION COMPLEXITY: Moderate   GOALS: Goals reviewed with patient?  No  SHORT TERM GOALS: Target date: 06/05/2023  Pt will be independent with HEP in order to improve strength and balance in order to decrease fall risk and improve function at home and work. Baseline: NT 06/05/23: Able to perform independently  Goal status: Achieved   2.  Pt will increase by at least 0.13 m/s in order to demonstrate clinically significant improvement in community ambulation.  Baseline:  0.81 m/sec  Goal status: ONGOING   3.  Pt will increase by at least 23m (136ft) in order to demonstrate clinically significant improvement in cardiopulmonary endurance and community ambulation Baseline:  800 ft  Goal status: Ongoing   4.  Patient will show correct use of single point cane and rollator for improved ambulation and to decrease risk of falling.  Baseline: NT 06/04/23: Using SPC  Goal status: ACHIEVED    LONG TERM GOALS: Target date: 07/31/2023  Patient will have improved function and activity level as evidenced by an increase in FOTO score by 10 points or more.  Baseline: 40/100 with target of 47  Goal status: ONGOING   2.  Patient will ambulate >=1000 ft for as evidence of improved aerobic endurance and lumbar function.  Baseline: 800 ft  Goal status: ONGOING   3. Patient will perform 10 meter walk test in >=1 m/sec for improved mobility and to decrease her risk of falling. Baseline: 0.81 m/sec  Goal status: ONGOING   4.  Patient will demonstrate an improvement in DGI score of >=2 pts as evidence of improved dynamic balance to decrease risk of falling (Pardasaney et al, 2012). Baseline: 14/24 Goal status: ONGOING   PLAN:  PT FREQUENCY: 1-2x/week  PT DURATION: 10 weeks  PLANNED INTERVENTIONS: Therapeutic exercises, Therapeutic activity, Neuromuscular re-education, Balance training, Gait training, Patient/Family education, Self Care, Joint mobilization, Joint manipulation, Stair training, Vestibular training, Canalith repositioning, DME  instructions, Aquatic Therapy, Dry Needling, Electrical stimulation, Spinal manipulation, Spinal mobilization, Cryotherapy, Moist heat, Traction, Ultrasound, Manual therapy, and Re-evaluation.  PLAN FOR NEXT SESSION: FOTO. Progress LE strengthening and  dynamic balance exercises: Ankle weight step overs. Standing marches    Ellin Goodie PT, DPT  [facility] [address] [place], [address] Phone: [phone]   Fax:  [phone]

## 2023-06-05 NOTE — Telephone Encounter (Signed)
Patient notified

## 2023-06-06 ENCOUNTER — Encounter: Payer: Medicare HMO | Admitting: Physical Therapy

## 2023-06-07 ENCOUNTER — Ambulatory Visit: Payer: Medicare HMO | Admitting: Physical Therapy

## 2023-06-07 ENCOUNTER — Encounter: Payer: Self-pay | Admitting: Physical Therapy

## 2023-06-07 ENCOUNTER — Telehealth: Payer: Self-pay | Admitting: Physical Therapy

## 2023-06-07 ENCOUNTER — Other Ambulatory Visit: Payer: Self-pay | Admitting: Internal Medicine

## 2023-06-07 ENCOUNTER — Telehealth: Payer: Self-pay

## 2023-06-07 DIAGNOSIS — M5459 Other low back pain: Secondary | ICD-10-CM | POA: Diagnosis not present

## 2023-06-07 DIAGNOSIS — R262 Difficulty in walking, not elsewhere classified: Secondary | ICD-10-CM | POA: Diagnosis not present

## 2023-06-07 DIAGNOSIS — M533 Sacrococcygeal disorders, not elsewhere classified: Secondary | ICD-10-CM | POA: Diagnosis not present

## 2023-06-07 DIAGNOSIS — R2689 Other abnormalities of gait and mobility: Secondary | ICD-10-CM | POA: Diagnosis not present

## 2023-06-07 DIAGNOSIS — R278 Other lack of coordination: Secondary | ICD-10-CM | POA: Diagnosis not present

## 2023-06-07 NOTE — Telephone Encounter (Signed)
Called pt to inform her that she was scheduled for apt on September 12 at 9:45 am. Did not reach so left VM

## 2023-06-07 NOTE — Telephone Encounter (Signed)
Prescription Request  06/07/2023  LOV: Visit date not found  What is the name of the medication or equipment? oxyCODONE-acetaminophen (PERCOCET/ROXICET) 5-325 MG tablet  Have you contacted your pharmacy to request a refill? Yes   Which pharmacy would you like this sent to?  [id number] [place], [place] - [address] [place] [address] Phone: [phone] Fax: [phone]   Patient notified that their request is being sent to the clinical staff for review and that they should receive a response within 2 business days.   Please advise at Mobile [phone] (mobile)  Patient states she has about four pills left, so she definitely needs to have this refilled tomorrow or she will probably be in the ED with pain.

## 2023-06-07 NOTE — Telephone Encounter (Signed)
Requesting: oxycodone Contract: No UDS: No Last Visit: 05/16/2023 Next Visit: 08/13/2023 Last Refill: 04/26/2023  Please Advise

## 2023-06-08 MED ORDER — OXYCODONE-ACETAMINOPHEN 5-325 MG PO TABS: 1.0000 | ORAL_TABLET | Freq: Four times a day (QID) | ORAL | 0 refills | Status: DC | PRN

## 2023-06-08 NOTE — Telephone Encounter (Signed)
LMTCB

## 2023-06-08 NOTE — Telephone Encounter (Signed)
Patient states she would like for Dr. Duncan Dull know that she went to [facility] for her epidural, but they discovered that they had prior authorization for the wrong side, so she will have to wait to have her injection.

## 2023-06-08 NOTE — Telephone Encounter (Signed)
I will refill the oxycodone for 2 months.   She needs to sign a contract next visit .  Referral to [facility] is underway

## 2023-06-10 ENCOUNTER — Other Ambulatory Visit: Payer: Self-pay | Admitting: Psychiatry

## 2023-06-10 DIAGNOSIS — F3341 Major depressive disorder, recurrent, in partial remission: Secondary | ICD-10-CM

## 2023-06-10 DIAGNOSIS — G8929 Other chronic pain: Secondary | ICD-10-CM

## 2023-06-11 ENCOUNTER — Encounter: Payer: Self-pay | Admitting: Internal Medicine

## 2023-06-12 ENCOUNTER — Encounter: Payer: Medicare HMO | Admitting: Physical Therapy

## 2023-06-12 ENCOUNTER — Encounter: Payer: Self-pay | Admitting: Internal Medicine

## 2023-06-13 ENCOUNTER — Other Ambulatory Visit: Payer: Self-pay

## 2023-06-13 DIAGNOSIS — L4052 Psoriatic arthritis mutilans: Secondary | ICD-10-CM

## 2023-06-13 NOTE — Telephone Encounter (Signed)
Pt was notified. Lab order placed pt scheduled to come in on 09/09

## 2023-06-18 ENCOUNTER — Encounter: Payer: Medicare HMO | Admitting: Physical Therapy

## 2023-06-18 ENCOUNTER — Other Ambulatory Visit (INDEPENDENT_AMBULATORY_CARE_PROVIDER_SITE_OTHER): Payer: Medicare HMO

## 2023-06-18 DIAGNOSIS — L4052 Psoriatic arthritis mutilans: Secondary | ICD-10-CM

## 2023-06-18 LAB — SEDIMENTATION RATE: Sed Rate: 40 mm/h — ABNORMAL HIGH (ref 0–30)

## 2023-06-19 ENCOUNTER — Other Ambulatory Visit: Payer: Self-pay | Admitting: Psychiatry

## 2023-06-19 ENCOUNTER — Ambulatory Visit: Payer: Medicare HMO | Admitting: Physical Therapy

## 2023-06-19 DIAGNOSIS — F3342 Major depressive disorder, recurrent, in full remission: Secondary | ICD-10-CM

## 2023-06-20 ENCOUNTER — Encounter: Payer: Medicare HMO | Admitting: Physical Therapy

## 2023-06-20 DIAGNOSIS — R58 Hemorrhage, not elsewhere classified: Secondary | ICD-10-CM | POA: Diagnosis not present

## 2023-06-20 DIAGNOSIS — L03012 Cellulitis of left finger: Secondary | ICD-10-CM | POA: Diagnosis not present

## 2023-06-20 DIAGNOSIS — L570 Actinic keratosis: Secondary | ICD-10-CM | POA: Diagnosis not present

## 2023-06-20 DIAGNOSIS — L02423 Furuncle of right upper limb: Secondary | ICD-10-CM | POA: Diagnosis not present

## 2023-06-20 DIAGNOSIS — D485 Neoplasm of uncertain behavior of skin: Secondary | ICD-10-CM | POA: Diagnosis not present

## 2023-06-20 DIAGNOSIS — D1801 Hemangioma of skin and subcutaneous tissue: Secondary | ICD-10-CM | POA: Diagnosis not present

## 2023-06-20 DIAGNOSIS — R208 Other disturbances of skin sensation: Secondary | ICD-10-CM | POA: Diagnosis not present

## 2023-06-21 ENCOUNTER — Ambulatory Visit: Payer: Medicare HMO | Admitting: Physical Therapy

## 2023-06-21 DIAGNOSIS — S63502A Unspecified sprain of left wrist, initial encounter: Secondary | ICD-10-CM | POA: Diagnosis not present

## 2023-06-21 DIAGNOSIS — L03012 Cellulitis of left finger: Secondary | ICD-10-CM | POA: Diagnosis not present

## 2023-06-25 ENCOUNTER — Encounter: Payer: Medicare HMO | Admitting: Physical Therapy

## 2023-06-26 ENCOUNTER — Encounter: Payer: Medicare HMO | Admitting: Physical Therapy

## 2023-06-27 ENCOUNTER — Encounter: Payer: Medicare HMO | Admitting: Physical Therapy

## 2023-06-28 ENCOUNTER — Encounter: Payer: Medicare HMO | Admitting: Physical Therapy

## 2023-07-02 ENCOUNTER — Encounter: Payer: Medicare HMO | Admitting: Physical Therapy

## 2023-07-03 DIAGNOSIS — M5416 Radiculopathy, lumbar region: Secondary | ICD-10-CM | POA: Diagnosis not present

## 2023-07-04 ENCOUNTER — Encounter: Payer: Medicare HMO | Admitting: Physical Therapy

## 2023-07-05 ENCOUNTER — Encounter: Payer: Medicare HMO | Admitting: Physical Therapy

## 2023-07-09 ENCOUNTER — Encounter: Payer: Medicare HMO | Admitting: Physical Therapy

## 2023-07-10 ENCOUNTER — Other Ambulatory Visit: Payer: Self-pay | Admitting: Psychiatry

## 2023-07-10 DIAGNOSIS — M5489 Other dorsalgia: Secondary | ICD-10-CM

## 2023-07-10 DIAGNOSIS — F3341 Major depressive disorder, recurrent, in partial remission: Secondary | ICD-10-CM

## 2023-07-10 NOTE — Telephone Encounter (Signed)
LF 06/10/23 180 capsules with 1 rf;  PT should have medication. Rf not appropriate. LV 05/17/23 no upcoming appt.   However, on the side where the medication changes are noted, when I hover over the Duloxetine it says this:  Order Details Ordered on: 05/17/23  Associated Dx: Depression, major, recurrent, in partial remission (HCC); Other chronic back pain  Discontinued on: 06/10/23  Authorizing provider: Lauraine Rinne., MD    Is this referring to the org rx of take 1 daily for 1 week then 2 daily, since she's taking 2 daily now?

## 2023-07-11 ENCOUNTER — Ambulatory Visit: Payer: Medicare HMO | Attending: Orthopedic | Admitting: Physical Therapy

## 2023-07-11 ENCOUNTER — Encounter: Payer: Medicare HMO | Admitting: Physical Therapy

## 2023-07-11 DIAGNOSIS — M5459 Other low back pain: Secondary | ICD-10-CM | POA: Insufficient documentation

## 2023-07-11 DIAGNOSIS — R262 Difficulty in walking, not elsewhere classified: Secondary | ICD-10-CM | POA: Insufficient documentation

## 2023-07-11 NOTE — Telephone Encounter (Signed)
Call to RS

## 2023-07-12 ENCOUNTER — Telehealth: Payer: Self-pay | Admitting: Physical Therapy

## 2023-07-12 NOTE — Telephone Encounter (Signed)
Called patient when she did not show up to her PT appointment scheduled yesterday at 2:30pm. Left VM notifying her of the missed visit an asking her to call back to confirm her next appointment.   Madeline Young. Ilsa Iha, PT, DPT 07/12/23, 1:00 PM  [facility] [address] [place], [place] [address] P: [phone] I F: [phone]

## 2023-07-16 ENCOUNTER — Encounter: Payer: Medicare HMO | Admitting: Physical Therapy

## 2023-07-16 ENCOUNTER — Ambulatory Visit: Payer: Medicare HMO | Admitting: Physical Therapy

## 2023-07-16 DIAGNOSIS — R262 Difficulty in walking, not elsewhere classified: Secondary | ICD-10-CM

## 2023-07-16 DIAGNOSIS — M533 Sacrococcygeal disorders, not elsewhere classified: Secondary | ICD-10-CM

## 2023-07-16 DIAGNOSIS — R2689 Other abnormalities of gait and mobility: Secondary | ICD-10-CM

## 2023-07-16 DIAGNOSIS — R278 Other lack of coordination: Secondary | ICD-10-CM

## 2023-07-16 DIAGNOSIS — M5459 Other low back pain: Secondary | ICD-10-CM

## 2023-07-17 DIAGNOSIS — M545 Low back pain, unspecified: Secondary | ICD-10-CM | POA: Diagnosis not present

## 2023-07-17 DIAGNOSIS — M5412 Radiculopathy, cervical region: Secondary | ICD-10-CM | POA: Diagnosis not present

## 2023-07-18 ENCOUNTER — Encounter: Payer: Medicare HMO | Admitting: Physical Therapy

## 2023-07-18 ENCOUNTER — Ambulatory Visit: Payer: Medicare HMO | Admitting: Physical Therapy

## 2023-07-18 ENCOUNTER — Encounter: Payer: Self-pay | Admitting: Physical Therapy

## 2023-07-18 DIAGNOSIS — R262 Difficulty in walking, not elsewhere classified: Secondary | ICD-10-CM

## 2023-07-18 DIAGNOSIS — M5459 Other low back pain: Secondary | ICD-10-CM | POA: Diagnosis not present

## 2023-07-18 NOTE — Therapy (Signed)
OUTPATIENT PHYSICAL THERAPY TREATMENT   Patient Name: Madeline Young MRN: [id number] DOB:[birth year], 67 y.o., female Today's Date: 07/18/2023  END OF SESSION:  PT End of Session - 07/18/23 1250     Visit Number 7    Number of Visits 20    Date for PT Re-Evaluation 07/30/23    Authorization Type [employer] 2024    Authorization Time Period 05/21/23-07/30/23    Authorization - Visit Number 7    Authorization - Number of Visits 20    Progress Note Due on Visit 10    PT Start Time 1120    PT Stop Time 1202    PT Time Calculation (min) 42 min    Activity Tolerance Patient tolerated treatment well;No increased pain    Behavior During Therapy [facility] for tasks assessed/performed             Past Medical History:  Diagnosis Date   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   ADHD (attention deficit hyperactivity disorder)    Allergy    Anxiety    Arthritis    Cataract    bil cateracts removed   Complication of anesthesia    first colonoscopy pt states she woke up   Depression    GERD (gastroesophageal reflux disease)    Globus sensation    Hyperlipidemia    Incontinence    bowels at times   Insomnia    Multinodular goiter (nontoxic)    Psoriatic arthritis (HCC)    Spinal stenosis of lumbar region 03/10/2015   MRI    Statin intolerance 01/27/2013   Past Surgical History:  Procedure Laterality Date   BACK SURGERY  02/17/2016   Dr. Leotis Shames- L4-L5 fusion    BIOPSY THYROID  2014   COLONOSCOPY     COLONOSCOPY W/ POLYPECTOMY     EYE SURGERY     bilateral cataract surgery w/ lens implant   FOOT SURGERY  2006   Right foot , secondary to severe loss of joint [facility])   POLYPECTOMY     TONSILLECTOMY     UPPER GASTROINTESTINAL ENDOSCOPY  2005   With empiric esophageal dilation   Patient Active Problem List   Diagnosis Date Noted   Slac (scapholunate advanced collapse) of wrist, left 06/03/2023   Mass of abdomen 05/23/2023   Urge incontinence 05/16/2022   Onychomycosis  08/17/2021   Drug allergy 06/29/2021   Fibromyalgia 06/29/2021   Generalized osteoarthritis 06/29/2021   Hand joint pain 06/29/2021   Other intervertebral disc degeneration, lumbar region 06/29/2021   Other long term (current) drug therapy 06/29/2021   Sciatica 01/28/2020   Rhinitis, non-allergic 11/10/2019   Insomnia 08/10/2019   Anxiety 10/11/2018   Carotid artery stenosis, asymptomatic, bilateral 10/29/2016   ADD (attention deficit disorder) 10/29/2016   Spondylolisthesis of lumbar region 02/17/2016   Globus sensation since at least 2005 04/29/2015   GERD (gastroesophageal reflux disease) 11/17/2014   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   Travel advice encounter 01/27/2013   Multinodular goiter (nontoxic) 08/11/2012   Overweight (BMI 25.0-29.9) 08/11/2012   Psoriatic arthritis, destructive type (HCC)    Screening for cervical cancer 08/21/2011   Screening for breast cancer 08/21/2011   Hx of adenomatous polyp of colon 10/06/2008   Familial hyperlipidemia 09/15/2008   Major depressive disorder, single episode, in remission (HCC) 09/15/2008   ALLERGIC RHINITIS 09/15/2008    PCP: Dr. Duncan Dull   REFERRING PROVIDER: Duke Salvia NP   REFERRING DIAG: Spinal stenosis of lumbar region  Rationale for Evaluation and Treatment:  Rehabilitation  THERAPY DIAG:  Other low back pain  Difficulty in walking, not elsewhere classified  ONSET DATE: 2017   PERTINENT HISTORY:  Pt reports that she has three different types of arthritis: osteo, rheumatoid, and psoriatic. She had lumbar fusion in 2017 which she felt did not help significantly. She had an infusion for her psoriatic arthritis this past spring and it caused her low back pain. She recently had a spinal injection and this resolved radiating pain on her LLE, but she still has excruciating, radiating pain down her right lower extremity. She feels unsteady most of the time and she has to use walker to keep from falling. Pt has also been  told that she needs a reverse total shoulder because right shoulder RTC damage.   SUBJECTIVE:                                                                                                                                                                                           SUBJECTIVE STATEMENT: Patient states she continues to have horrible spells of pain in the morning when she gets up. It is on the right side and it is excruciating. It takes her about 3 hours before she can get up and function a little. She gets in the shower, then uses cream, and get ice and pain medication. It is unbearable. It stays in the right upper region. She states since last PT session she has had some injections that did not have any effect. When she was in [place] she fell and hurt her left leg a little and she thinks that is why it is flaring up a little. She saw her doctor at [facility] yesterday who didn't have much to offer per patient. She states she asked about a nerve block and he referred her to a pain clinic and to see a neurosurgeon. She states her balance is terrible. She states it helps to pull her legs up towards her chest in the bed and that helps her pain some. She states she used to love yoga and loved it. She states if she walks not very far and she starts to feel weak in the middle of the low back. She felt better when she was on prednisone. Patient states she has not been doing anything except trying to get through the day. She has been walking and getting up throughout the day but not her HEP.   Loves being outside, walking.   PAIN:  Are you having pain? NPRS: 5/10 at right lower back and lateral anterior left thigh and right shoulder.   PRECAUTIONS: None  PATIENT GOALS: To improve balance  and to decrease   OBJECTIVE:    TODAY'S TREATMENT:                                                                                                                              Therapeutic exercise: to  centralize symptoms and improve ROM, strength, muscular endurance, and activity tolerance required for successful completion of functional activities.  - standing lumbar extension, 2x10 (increased right lower back pain at first set, no worse after; 2nd set right low back feeling better, slightly better after) - hooklying bridge with B shoulder flexion and breath (inhale with concentric phase, exhale with eccentric phase), 3x10 (fatigue by end of last set).  - hooklying abdominal brace with march up up, down down to table top position, inhale with marching up, exhale with marching down, 2x10.  - education on breathing/medication to help calm nervous system, consideration of TENS while doing painful activities, exploration of meaningful activities, pain education, education on rollator, spinal stenosis. (Patient loves yoga and can perform isolated breathing from chest or diaphragm).  - hooklying ankle pump while stretching hamstrings with UE support behind back of leg, 1x15 each side.  - Education on HEP including handout   Pt required multimodal cuing for proper technique and to facilitate improved neuromuscular control, strength, range of motion, and functional ability resulting in improved performance and form.  PATIENT EDUCATION:  Education details: form and technique for correct performance of exercise. How to use SPC correctly and safely  Person educated: Patient Education method: Explanation, Demonstration, Verbal cues, and Handouts Education comprehension: verbalized understanding, returned demonstration, and verbal cues required  HOME EXERCISE PROGRAM: Access Code: [email or web address] URL: [email or web address] Date: 07/18/2023 Prepared by: Norton Blizzard  Exercises - Standing Lumbar Extension with Counter  - 4 x daily - 1 sets - 10-15 reps - 1 second hold  HOME EXERCISE PROGRAM [5JLRY9U] View at "[email or web address]" using code: [id number] Supine Marching (phase 2) - 'up up, down  down' -  Repeat 10 Repetitions, Complete 2 Sets, Perform 2 Times a Day  SCIATIC NERVE GLIDE -  Repeat 15 Repetitions, Complete 1 Set, Perform 2    ASSESSMENT:  CLINICAL IMPRESSION: Patient arrives reporting mild worsening of symptoms since last PT session that was completed nearly 6 weeks ago. She reports being unaware of some of her visits and also busy with helping her daughter move to [place]. Repeated motions testing was completed with mild improvement in symptoms with repeated lumbar extension. However, she continues to describe symptomatic spinal stenosis. Prescribed standing lumbar extension to complete at home with instructions to stop if it is making symptoms worse. Also initiated gentle core and LE strengthening and nerve gliding/exercise for LE edema to help patient return to engaging with HEP in context of chronic pain that is making it difficult for her to get through her day. No change in suspected MDT classification. Patient reported feeling better by end of session and tolerated exercises well. She also appeared open to  multi-modal approach including breathing exercises and meditation to help decrease nervous system sensitivity. Patient would benefit from continued management of limiting condition by skilled physical therapist to address remaining impairments and functional limitations to work towards stated goals and return to PLOF or maximal functional independence.    OBJECTIVE IMPAIRMENTS: Abnormal gait, decreased balance, decreased endurance, decreased knowledge of use of DME, decreased mobility, difficulty walking, decreased ROM, decreased strength, hypomobility, impaired flexibility, impaired sensation, impaired UE functional use, and pain.   ACTIVITY LIMITATIONS: carrying, lifting, bending, standing, squatting, sleeping, stairs, bed mobility, bathing, toileting, dressing, reach over head, hygiene/grooming, and locomotion level  PARTICIPATION LIMITATIONS: cleaning, laundry,  driving, shopping, community activity, and yard work  PERSONAL FACTORS: Past/current experiences, Time since onset of injury/illness/exacerbation, and 3+ comorbidities: psoriatic and rheumatoid arthritis,   are also affecting patient's functional outcome.   REHAB POTENTIAL: Fair chronicity of low back and multiple co-morbidities  CLINICAL DECISION MAKING: Evolving/moderate complexity  EVALUATION COMPLEXITY: Moderate   GOALS: Goals reviewed with patient? No  SHORT TERM GOALS: Target date: 06/05/2023  Pt will be independent with HEP in order to improve strength and balance in order to decrease fall risk and improve function at home and work. Baseline: NT 06/05/23: Able to perform independently  Goal status: Achieved   2.  Pt will increase by at least 0.13 m/s in order to demonstrate clinically significant improvement in community ambulation.  Baseline:  0.81 m/sec  Goal status: ONGOING   3.  Pt will increase by at least 73m (129ft) in order to demonstrate clinically significant improvement in cardiopulmonary endurance and community ambulation Baseline:  800 ft  Goal status: Ongoing   4.  Patient will show correct use of single point cane and rollator for improved ambulation and to decrease risk of falling.  Baseline: NT 06/04/23: Using SPC  Goal status: ACHIEVED    LONG TERM GOALS: Target date: 07/31/2023  Patient will have improved function and activity level as evidenced by an increase in FOTO score by 10 points or more.  Baseline: 40/100 with target of 47  Goal status: ONGOING   2.  Patient will ambulate >=1000 ft for as evidence of improved aerobic endurance and lumbar function.  Baseline: 800 ft  Goal status: ONGOING   3. Patient will perform 10 meter walk test in >=1 m/sec for improved mobility and to decrease her risk of falling. Baseline: 0.81 m/sec  Goal status: ONGOING   4.  Patient will demonstrate an improvement in DGI score of >=2 pts as evidence  of improved dynamic balance to decrease risk of falling (Pardasaney et al, 2012). Baseline: 14/24 Goal status: ONGOING   PLAN:  PT FREQUENCY: 1-3x/week  PT DURATION: 10 weeks  PLANNED INTERVENTIONS: Therapeutic exercises, Therapeutic activity, Neuromuscular re-education, Balance training, Gait training, Patient/Family education, Self Care, Joint mobilization, Joint manipulation, Stair training, Vestibular training, Canalith repositioning, DME instructions, Aquatic Therapy, Dry Needling, Electrical stimulation, Spinal manipulation, Spinal mobilization, Cryotherapy, Moist heat, Traction, Ultrasound, Manual therapy, and Re-evaluation.  PLAN FOR NEXT SESSION: FOTO. Progress LE strengthening and  dynamic balance exercises: Ankle weight step overs. Standing marches    Luretha Murphy. Ilsa Iha, PT, DPT, Cert. MDT 07/18/23, 5:33 PM  [facility] [address] [place], [place] [address] P: [phone] I F: [phone]

## 2023-07-20 ENCOUNTER — Telehealth: Payer: Self-pay | Admitting: Psychiatry

## 2023-07-20 NOTE — Telephone Encounter (Signed)
Patient reports that she is taking 120 mg of duloxetine and has for about 1-1/2 to 2 weeks.  Rx was sent for #90 tablets but one every day. She said you told her that she could take more. Even though it hasn't been that long that she has been taking this dose she feels like she is seeing some benefit and is reporting no SE. She wants to know if she can take two 60 mg tablets together or if she should do AM and PM. She has FU 12/5.

## 2023-07-20 NOTE — Telephone Encounter (Signed)
Pt was called @ 10:20a for follow up appt because of the recent refill request from the pharmacy.  She said the bottle she has has Duloxetine HCL which is different than the one she was taking.  She also states she went from 30mg  to 60mg  to 90mg .  She wants someone to call her back confirm the medication and the dosage.  Next appt 12/5

## 2023-07-23 ENCOUNTER — Ambulatory Visit: Payer: Medicare HMO | Admitting: Physical Therapy

## 2023-07-23 ENCOUNTER — Telehealth: Payer: Self-pay | Admitting: Physical Therapy

## 2023-07-23 ENCOUNTER — Encounter: Payer: Medicare HMO | Admitting: Physical Therapy

## 2023-07-23 NOTE — Telephone Encounter (Signed)
Called patient when she did not come to her PT appointment scheduled for today at 11:15am. Patient answered and thought her appointments this week were T/R at 11:15. She rescheduled her appointments this week to T 10/15 at 1:45pm and R 10/17 at 2:30p.   Luretha Murphy. Ilsa Iha, PT, DPT 07/23/23, 11:40 AM  [facility] [address] [place], [place] [address] P: [phone] I F: [phone]

## 2023-07-23 NOTE — Telephone Encounter (Signed)
She can take 2 of the duloxetine 60 mg daily if she wants to try it for mood and chronic pain.  Higher doses can help pain.  The SE to look for is excessive sweating.

## 2023-07-24 ENCOUNTER — Ambulatory Visit: Payer: Medicare HMO | Admitting: Physical Therapy

## 2023-07-24 ENCOUNTER — Telehealth: Payer: Self-pay | Admitting: Psychiatry

## 2023-07-24 ENCOUNTER — Other Ambulatory Visit: Payer: Self-pay | Admitting: Psychiatry

## 2023-07-24 ENCOUNTER — Encounter: Payer: Self-pay | Admitting: Physical Therapy

## 2023-07-24 DIAGNOSIS — F900 Attention-deficit hyperactivity disorder, predominantly inattentive type: Secondary | ICD-10-CM

## 2023-07-24 DIAGNOSIS — R262 Difficulty in walking, not elsewhere classified: Secondary | ICD-10-CM | POA: Diagnosis not present

## 2023-07-24 DIAGNOSIS — M5459 Other low back pain: Secondary | ICD-10-CM

## 2023-07-24 NOTE — Telephone Encounter (Signed)
Pended.

## 2023-07-24 NOTE — Telephone Encounter (Signed)
Correction* lf 06/21/23

## 2023-07-24 NOTE — Telephone Encounter (Signed)
LV 08/08/; LF 05/17/23 W. 1RF; NV 15/05/24- CHANGED SIG. TO 'TAKE 1 (200 MG) TABLET EACH MORNING.' With 1 rf to get her to her nxt appt.

## 2023-07-24 NOTE — Telephone Encounter (Signed)
Patient is aware 

## 2023-07-24 NOTE — Telephone Encounter (Signed)
Pt called reequesting a refill on her modafinil 200 mg. She would several refills attached to the script. Pharmacy is [facility] [place] [address] in [place]

## 2023-07-24 NOTE — Therapy (Signed)
OUTPATIENT PHYSICAL THERAPY TREATMENT   Patient Name: Madeline Young MRN: [id number] DOB:11-Apr-1956, 67 y.o., female Today's Date: 07/24/2023  END OF SESSION:  PT End of Session - 07/24/23 1507     Visit Number 8    Number of Visits 20    Date for PT Re-Evaluation 07/30/23    Authorization Type [employer] 2024 reporting period from 05/21/2023    Authorization - Visit Number --    Authorization - Number of Visits --    Progress Note Due on Visit 10    PT Start Time 1355    PT Stop Time 1440    PT Time Calculation (min) 45 min    Activity Tolerance Patient tolerated treatment well;No increased pain    Behavior During Therapy [facility] for tasks assessed/performed              Past Medical History:  Diagnosis Date   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   ADHD (attention deficit hyperactivity disorder)    Allergy    Anxiety    Arthritis    Cataract    bil cateracts removed   Complication of anesthesia    first colonoscopy pt states she woke up   Depression    GERD (gastroesophageal reflux disease)    Globus sensation    Hyperlipidemia    Incontinence    bowels at times   Insomnia    Multinodular goiter (nontoxic)    Psoriatic arthritis (HCC)    Spinal stenosis of lumbar region 03/10/2015   MRI    Statin intolerance 01/27/2013   Past Surgical History:  Procedure Laterality Date   BACK SURGERY  02/17/2016   Dr. Leotis Shames- L4-L5 fusion    BIOPSY THYROID  2014   COLONOSCOPY     COLONOSCOPY W/ POLYPECTOMY     EYE SURGERY     bilateral cataract surgery w/ lens implant   FOOT SURGERY  2006   Right foot , secondary to severe loss of joint [facility])   POLYPECTOMY     TONSILLECTOMY     UPPER GASTROINTESTINAL ENDOSCOPY  2005   With empiric esophageal dilation   Patient Active Problem List   Diagnosis Date Noted   Slac (scapholunate advanced collapse) of wrist, left 06/03/2023   Mass of abdomen 05/23/2023   Urge incontinence 05/16/2022   Onychomycosis 08/17/2021    Drug allergy 06/29/2021   Fibromyalgia 06/29/2021   Generalized osteoarthritis 06/29/2021   Hand joint pain 06/29/2021   Other intervertebral disc degeneration, lumbar region 06/29/2021   Other long term (current) drug therapy 06/29/2021   Sciatica 01/28/2020   Rhinitis, non-allergic 11/10/2019   Insomnia 08/10/2019   Anxiety 10/11/2018   Carotid artery stenosis, asymptomatic, bilateral 10/29/2016   ADD (attention deficit disorder) 10/29/2016   Spondylolisthesis of lumbar region 02/17/2016   Globus sensation since at least 2005 04/29/2015   GERD (gastroesophageal reflux disease) 11/17/2014   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   Travel advice encounter 01/27/2013   Multinodular goiter (nontoxic) 08/11/2012   Overweight (BMI 25.0-29.9) 08/11/2012   Psoriatic arthritis, destructive type (HCC)    Screening for cervical cancer 08/21/2011   Screening for breast cancer 08/21/2011   Hx of adenomatous polyp of colon 10/06/2008   Familial hyperlipidemia 09/15/2008   Major depressive disorder, single episode, in remission (HCC) 09/15/2008   ALLERGIC RHINITIS 09/15/2008    PCP: Dr. Duncan Dull   REFERRING PROVIDER: Duke Salvia NP   REFERRING DIAG: Spinal stenosis of lumbar region  Rationale for Evaluation and Treatment: Rehabilitation  THERAPY DIAG:  Other low back pain  Difficulty in walking, not elsewhere classified  ONSET DATE: 2017   PERTINENT HISTORY:  Pt reports that she has three different types of arthritis: osteo, rheumatoid, and psoriatic. She had lumbar fusion in 2017 which she felt did not help significantly. She had an infusion for her psoriatic arthritis this past spring and it caused her low back pain. She recently had a spinal injection and this resolved radiating pain on her LLE, but she still has excruciating, radiating pain down her right lower extremity. She feels unsteady most of the time and she has to use walker to keep from falling. Pt has also been told that she  needs a reverse total shoulder because right shoulder RTC damage.   SUBJECTIVE:                                                                                                                                                                                           SUBJECTIVE STATEMENT: Patient states she continues to have stabbing pain in the mornings that takes 2 hours to recover. She states she continues to be bothered by not being able to walk very far. She states she feels the table at PT was more effective in helping her complete lumbar extension in standing at the clinic than at home.   Loves being outside, walking.   PAIN:  Are you having pain? NPRS: 5/10 at right lower back.   PRECAUTIONS: None  PATIENT GOALS: To improve balance and to decrease   OBJECTIVE:    TODAY'S TREATMENT:                                                         Modality: for pain control and muscle relaxation.  TENS estim applied to low back with 2x2 inch square pads while completing exercises as noted below. Two channels (intensity to level 4.5 and 4 on 2 channels). Mode: modulated, Pulse width: 120 nanoS, Pulse Rate: 110 hz, continuous. Applied throughout session (except during nustep exercise). Pt response: decreased back pain during application of estim. skin within normal limits to visual inspection before and after.  Therapeutic exercise: to centralize symptoms and improve ROM, strength, muscular endurance, and activity tolerance required for successful completion of functional activities.  - NuStep level 1 using bilateral upper and lower extremities. Seat/handle setting 8/9. For improved extremity mobility, muscular endurance, and activity tolerance; and to induce the analgesic effect of aerobic  exercise, stimulate improved joint nutrition, and prepare body structures and systems for following interventions. x 5  minutes. Average SPM = 48.  Superset:  - seated L overhead press, 2x10 with 2#DB -  seated R dumbell curl, 2x10 with 2#DB (unable to perform overhead press due to shoulder pain) - seated row at [facility] machine without chest support, focusing on good posture. 3x10 at 10# cable.   - sit <> stand from 17 inch chair with minimal UE support on thighs, 1x7 (fatigue at proximal anterior thighs).  - Education on HEP including handout  - education about spinal stenosis and the effect of flexed or extended spine postures, and education on sleeping posture. Patient to try sleeping with something behind her knees.   Neuromuscular Re-education: to improve, balance, postural strength, muscle activation patterns, and stabilization strength required for functional activities: - forwards walking through agility ladder, attempting step over step pattern, 2x10 rungs with SPC in R UE and SBA. 6x10 rungs with no AD and CGA.  - lateral walking through agility ladder,  3x10 rungs each direction with no AD and CGA.  (Needed seated break at end of neuro re-ed due to legs feeling weak).   Pt required multimodal cuing for proper technique and to facilitate improved neuromuscular control, strength, range of motion, and functional ability resulting in improved performance and form.  PATIENT EDUCATION:  Education details: form and technique for correct performance of exercise. How to use SPC correctly and safely  Person educated: Patient Education method: Explanation, Demonstration, Verbal cues, and Handouts Education comprehension: verbalized understanding, returned demonstration, and verbal cues required  HOME EXERCISE PROGRAM: Access Code: [email or web address] URL: [email or web address] Date: 07/24/2023 Prepared by: Norton Blizzard  Exercises - Sit to Stand  - 3-4 x weekly - 2 sets - 7-10 reps - Standing Lumbar Extension with Counter  - 4 x daily - 1 sets - 10-15 reps - 1 second hold  HOME EXERCISE PROGRAM [[id number]] View at [email or web address]" using code: [id number] Supine Marching (phase 2) - 'up  up, down down' -  Repeat 10 Repetitions, Complete 2 Sets, Perform 2 Times a Day  SCIATIC NERVE GLIDE -  Repeat 15 Repetitions, Complete 1 Set, Perform 2    ASSESSMENT:  CLINICAL IMPRESSION: Patient arrives reporting good tolerance for and participation in HEP (except lumbar  extension which she did not do much of). Today's session utilized TENS unit during exercise to improve tolerance and demonstrate how she might use it at home. Patient was educated on spinal positions and effect it may have when a person has spinal stenosis and identified that she wants to try sleeping with her knees lifted to help decrease morning pain. She reported feeling better by end of session. Her balance and strength was appropriately challenged during the session. She would benefit from continued exercises for trunk stability/postural strength and functional strength and balance. Patient would benefit from continued management of limiting condition by skilled physical therapist to address remaining impairments and functional limitations to work towards stated goals and return to PLOF or maximal functional independence.   OBJECTIVE IMPAIRMENTS: Abnormal gait, decreased balance, decreased endurance, decreased knowledge of use of DME, decreased mobility, difficulty walking, decreased ROM, decreased strength, hypomobility, impaired flexibility, impaired sensation, impaired UE functional use, and pain.   ACTIVITY LIMITATIONS: carrying, lifting, bending, standing, squatting, sleeping, stairs, bed mobility, bathing, toileting, dressing, reach over head, hygiene/grooming, and locomotion level  PARTICIPATION LIMITATIONS: cleaning, laundry, driving, shopping, community activity, and yard work  PERSONAL FACTORS: Past/current experiences, Time since onset  of injury/illness/exacerbation, and 3+ comorbidities: psoriatic and rheumatoid arthritis,   are also affecting patient's functional outcome.   REHAB POTENTIAL: Fair chronicity of  low back and multiple co-morbidities  CLINICAL DECISION MAKING: Evolving/moderate complexity  EVALUATION COMPLEXITY: Moderate   GOALS: Goals reviewed with patient? No  SHORT TERM GOALS: Target date: 06/05/2023  Pt will be independent with HEP in order to improve strength and balance in order to decrease fall risk and improve function at home and work. Baseline: NT 06/05/23: Able to perform independently  Goal status: Achieved   2.  Pt will increase by at least 0.13 m/s in order to demonstrate clinically significant improvement in community ambulation.  Baseline:  0.81 m/sec  Goal status: ONGOING   3.  Pt will increase by at least 67m (112ft) in order to demonstrate clinically significant improvement in cardiopulmonary endurance and community ambulation Baseline:  800 ft  Goal status: Ongoing   4.  Patient will show correct use of single point cane and rollator for improved ambulation and to decrease risk of falling.  Baseline: NT 06/04/23: Using SPC  Goal status: ACHIEVED    LONG TERM GOALS: Target date: 07/31/2023  Patient will have improved function and activity level as evidenced by an increase in FOTO score by 10 points or more.  Baseline: 40/100 with target of 47  Goal status: ONGOING   2.  Patient will ambulate >=1000 ft for as evidence of improved aerobic endurance and lumbar function.  Baseline: 800 ft  Goal status: ONGOING   3. Patient will perform 10 meter walk test in >=1 m/sec for improved mobility and to decrease her risk of falling. Baseline: 0.81 m/sec  Goal status: ONGOING   4.  Patient will demonstrate an improvement in DGI score of >=2 pts as evidence of improved dynamic balance to decrease risk of falling (Pardasaney et al, 2012). Baseline: 14/24 Goal status: ONGOING   PLAN:  PT FREQUENCY: 1-3x/week  PT DURATION: 10 weeks  PLANNED INTERVENTIONS: Therapeutic exercises, Therapeutic activity, Neuromuscular re-education, Balance  training, Gait training, Patient/Family education, Self Care, Joint mobilization, Joint manipulation, Stair training, Vestibular training, Canalith repositioning, DME instructions, Aquatic Therapy, Dry Needling, Electrical stimulation, Spinal manipulation, Spinal mobilization, Cryotherapy, Moist heat, Traction, Ultrasound, Manual therapy, and Re-evaluation.  PLAN FOR NEXT SESSION: update HEP as appropriate, progressive LE/trunk/functional strengthening, motor control, and dynamic balance exercises. Consider ankle weight step overs. Standing marches    Luretha Murphy. Ilsa Iha, PT, DPT, Cert. MDT 07/24/23, 3:12 PM  [facility] [address] [place], [place] [address] P: [phone] I F: [phone]

## 2023-07-24 NOTE — Telephone Encounter (Signed)
LF 9/12

## 2023-07-25 ENCOUNTER — Ambulatory Visit: Payer: Medicare HMO | Admitting: Physical Therapy

## 2023-07-25 ENCOUNTER — Encounter: Payer: Medicare HMO | Admitting: Physical Therapy

## 2023-07-26 ENCOUNTER — Encounter: Payer: Self-pay | Admitting: Physical Therapy

## 2023-07-26 ENCOUNTER — Ambulatory Visit: Payer: Medicare HMO | Admitting: Physical Therapy

## 2023-07-26 DIAGNOSIS — R262 Difficulty in walking, not elsewhere classified: Secondary | ICD-10-CM | POA: Diagnosis not present

## 2023-07-26 DIAGNOSIS — M5459 Other low back pain: Secondary | ICD-10-CM | POA: Diagnosis not present

## 2023-07-26 NOTE — Therapy (Signed)
OUTPATIENT PHYSICAL THERAPY TREATMENT   Patient Name: ZITLALI PRIMM MRN: [id number] DOB:[birth year], 67 y.o., female Today's Date: 07/26/2023  END OF SESSION:  PT End of Session - 07/26/23 1436     Visit Number 9    Number of Visits 20    Date for PT Re-Evaluation 07/30/23    Authorization Type [employer] 2024 reporting period from 05/21/2023    Progress Note Due on Visit 10    PT Start Time 1436    PT Stop Time 1514    PT Time Calculation (min) 38 min    Activity Tolerance Patient tolerated treatment well;No increased pain    Behavior During Therapy [facility] for tasks assessed/performed               Past Medical History:  Diagnosis Date   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   ADHD (attention deficit hyperactivity disorder)    Allergy    Anxiety    Arthritis    Cataract    bil cateracts removed   Complication of anesthesia    first colonoscopy pt states she woke up   Depression    GERD (gastroesophageal reflux disease)    Globus sensation    Hyperlipidemia    Incontinence    bowels at times   Insomnia    Multinodular goiter (nontoxic)    Psoriatic arthritis (HCC)    Spinal stenosis of lumbar region 03/10/2015   MRI    Statin intolerance 01/27/2013   Past Surgical History:  Procedure Laterality Date   BACK SURGERY  02/17/2016   Dr. Leotis Shames- L4-L5 fusion    BIOPSY THYROID  2014   COLONOSCOPY     COLONOSCOPY W/ POLYPECTOMY     EYE SURGERY     bilateral cataract surgery w/ lens implant   FOOT SURGERY  2006   Right foot , secondary to severe loss of joint [facility])   POLYPECTOMY     TONSILLECTOMY     UPPER GASTROINTESTINAL ENDOSCOPY  2005   With empiric esophageal dilation   Patient Active Problem List   Diagnosis Date Noted   Slac (scapholunate advanced collapse) of wrist, left 06/03/2023   Mass of abdomen 05/23/2023   Urge incontinence 05/16/2022   Onychomycosis 08/17/2021   Drug allergy 06/29/2021   Fibromyalgia 06/29/2021   Generalized  osteoarthritis 06/29/2021   Hand joint pain 06/29/2021   Other intervertebral disc degeneration, lumbar region 06/29/2021   Other long term (current) drug therapy 06/29/2021   Sciatica 01/28/2020   Rhinitis, non-allergic 11/10/2019   Insomnia 08/10/2019   Anxiety 10/11/2018   Carotid artery stenosis, asymptomatic, bilateral 10/29/2016   ADD (attention deficit disorder) 10/29/2016   Spondylolisthesis of lumbar region 02/17/2016   Globus sensation since at least 2005 04/29/2015   GERD (gastroesophageal reflux disease) 11/17/2014   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   Travel advice encounter 01/27/2013   Multinodular goiter (nontoxic) 08/11/2012   Overweight (BMI 25.0-29.9) 08/11/2012   Psoriatic arthritis, destructive type (HCC)    Screening for cervical cancer 08/21/2011   Screening for breast cancer 08/21/2011   Hx of adenomatous polyp of colon 10/06/2008   Familial hyperlipidemia 09/15/2008   Major depressive disorder, single episode, in remission (HCC) 09/15/2008   ALLERGIC RHINITIS 09/15/2008    PCP: Dr. Duncan Dull   REFERRING PROVIDER: Duke Salvia NP   REFERRING DIAG: Spinal stenosis of lumbar region  Rationale for Evaluation and Treatment: Rehabilitation  THERAPY DIAG:  Other low back pain  Difficulty in walking, not elsewhere classified  ONSET  DATE: 2017   PERTINENT HISTORY:  Pt reports that she has three different types of arthritis: osteo, rheumatoid, and psoriatic. She had lumbar fusion in 2017 which she felt did not help significantly. She had an infusion for her psoriatic arthritis this past spring and it caused her low back pain. She recently had a spinal injection and this resolved radiating pain on her LLE, but she still has excruciating, radiating pain down her right lower extremity. She feels unsteady most of the time and she has to use walker to keep from falling. Pt has also been told that she needs a reverse total shoulder because right shoulder RTC damage.    SUBJECTIVE:                                                                                                                                                                                           SUBJECTIVE STATEMENT: Patient states after last PT session she went to the store and other activities and ended up falling later in the day. She thinks she turned around and lost her balance and fell on her right side and hit her shoulder and hip. Her son-in-law helped her get up. She was worried about her R shoulder but it is improved today. She is not feeling a lot of extra pain today. She states she was feeling pretty good after last PT session.  She states she tried various items under her legs while sleeping on her back, but has not found something that holds them high enough to relieve pressure on her low back yet.   Loves being outside, walking.   PAIN:  Are you having pain? NPRS: 5/10 pressure at middle lower back   PRECAUTIONS: None  PATIENT GOALS: To improve balance and to decrease   OBJECTIVE:    TODAY'S TREATMENT:                                                         Modality: for pain control and muscle relaxation.  TENS estim applied to low back with 2x2 inch square pads while completing exercises as noted below. Two channels (intensity to level 4 and 4.5 on 2 channels). Mode: modulated, Pulse width: 120 nanoS, Pulse Rate: 110 hz, continuous. Applied throughout session. Pt response: decreased back pain during application of estim. skin within normal limits to visual inspection before and after.  Therapeutic exercise: to centralize symptoms and improve ROM, strength, muscular endurance, and activity  tolerance required for successful completion of functional activities.  - sit <> stand from 17 inch chair with minimal UE support on thighs, 3x10 (paired with airex exercises).   - Standing pallof press (multifidus press) with 5# cable,  1x10 each side with 1-5 second hold. Cuing for  position.    Neuromuscular Re-education: to improve, balance, postural strength, muscle activation patterns, and stabilization strength required for functional activities: - forwards/backwards step up/down at airex pad with SPC and CGA-minA to prevent falls, 3x10 each foot (paired with sit <> stand).  - lateral stepping up and over airex pad (narrow width), 1x10 each direction with SPC and CGA-minA.  - seated (on clear theraball) single arm row, 1x10 each side with 5#cable (too easy), 2x10 each side with 10# cable. - seated (on clear theraball) single arm row while attempting to hold ipsilateral foot off the ground, 1x5 each side with 5#cable (unable to keep balance)  Pt required multimodal cuing for proper technique and to facilitate improved neuromuscular control, strength, range of motion, and functional ability resulting in improved performance and form.  PATIENT EDUCATION:  Education details: form and technique for correct performance of exercise. How to use SPC correctly and safely  Person educated: Patient Education method: Explanation, Demonstration, Verbal cues, and Handouts Education comprehension: verbalized understanding, returned demonstration, and verbal cues required  HOME EXERCISE PROGRAM: Access Code: [email or web address] URL: [email or web address] Date: 07/24/2023 Prepared by: Norton Blizzard  Exercises - Sit to Stand  - 3-4 x weekly - 2 sets - 7-10 reps - Standing Lumbar Extension with Counter  - 4 x daily - 1 sets - 10-15 reps - 1 second hold  HOME EXERCISE PROGRAM [[email or web address]] View at [email or web address]" using code: [email or web address] Supine Marching (phase 2) - 'up up, down down' -  Repeat 10 Repetitions, Complete 2 Sets, Perform 2 Times a Day  SCIATIC NERVE GLIDE -  Repeat 15 Repetitions, Complete 1 Set, Perform 2    ASSESSMENT:  CLINICAL IMPRESSION: Patient arrives reporting good tolerance for last PT session but also that she fell later that day. She also reports good  efforts to implement PT recommendations about sleeping posture. Today's session continued focus on improving functional and postural strength and motor control and balance. Exercise position varied to prevent weakness in B LE from prolonged standing with spinal stenosis. TENS was also utilized during exercise for pain control with good results. Patient continues to demonstrate deficits in balance, activity tolerance, postural strength, and functional strength. Patient would benefit from continued management of limiting condition by skilled physical therapist to address remaining impairments and functional limitations to work towards stated goals and return to PLOF or maximal functional independence.   OBJECTIVE IMPAIRMENTS: Abnormal gait, decreased balance, decreased endurance, decreased knowledge of use of DME, decreased mobility, difficulty walking, decreased ROM, decreased strength, hypomobility, impaired flexibility, impaired sensation, impaired UE functional use, and pain.   ACTIVITY LIMITATIONS: carrying, lifting, bending, standing, squatting, sleeping, stairs, bed mobility, bathing, toileting, dressing, reach over head, hygiene/grooming, and locomotion level  PARTICIPATION LIMITATIONS: cleaning, laundry, driving, shopping, community activity, and yard work  PERSONAL FACTORS: Past/current experiences, Time since onset of injury/illness/exacerbation, and 3+ comorbidities: psoriatic and rheumatoid arthritis,   are also affecting patient's functional outcome.   REHAB POTENTIAL: Fair chronicity of low back and multiple co-morbidities  CLINICAL DECISION MAKING: Evolving/moderate complexity  EVALUATION COMPLEXITY: Moderate   GOALS: Goals reviewed with patient? No  SHORT TERM GOALS: Target date: 06/05/2023  Pt will be independent with HEP in order to improve strength and  balance in order to decrease fall risk and improve function at home and work. Baseline: NT 06/05/23: Able to perform  independently  Goal status: Achieved   2.  Pt will increase by at least 0.13 m/s in order to demonstrate clinically significant improvement in community ambulation.  Baseline:  0.81 m/sec  Goal status: ONGOING   3.  Pt will increase by at least 63m (173ft) in order to demonstrate clinically significant improvement in cardiopulmonary endurance and community ambulation Baseline:  800 ft  Goal status: Ongoing   4.  Patient will show correct use of single point cane and rollator for improved ambulation and to decrease risk of falling.  Baseline: NT 06/04/23: Using SPC  Goal status: ACHIEVED    LONG TERM GOALS: Target date: 07/31/2023  Patient will have improved function and activity level as evidenced by an increase in FOTO score by 10 points or more.  Baseline: 40/100 with target of 47  Goal status: ONGOING   2.  Patient will ambulate >=1000 ft for as evidence of improved aerobic endurance and lumbar function.  Baseline: 800 ft  Goal status: ONGOING   3. Patient will perform 10 meter walk test in >=1 m/sec for improved mobility and to decrease her risk of falling. Baseline: 0.81 m/sec  Goal status: ONGOING   4.  Patient will demonstrate an improvement in DGI score of >=2 pts as evidence of improved dynamic balance to decrease risk of falling (Pardasaney et al, 2012). Baseline: 14/24 Goal status: ONGOING   PLAN:  PT FREQUENCY: 1-3x/week  PT DURATION: 10 weeks  PLANNED INTERVENTIONS: Therapeutic exercises, Therapeutic activity, Neuromuscular re-education, Balance training, Gait training, Patient/Family education, Self Care, Joint mobilization, Joint manipulation, Stair training, Vestibular training, Canalith repositioning, DME instructions, Aquatic Therapy, Dry Needling, Electrical stimulation, Spinal manipulation, Spinal mobilization, Cryotherapy, Moist heat, Traction, Ultrasound, Manual therapy, and Re-evaluation.  PLAN FOR NEXT SESSION: update HEP as  appropriate, progressive LE/trunk/functional strengthening, motor control, and dynamic balance exercises. Consider ankle weight step overs. Standing marches    Luretha Murphy. Ilsa Iha, PT, DPT, Cert. MDT 07/26/23, 5:57 PM  [facility] [address] [place], [place] [address] P: [phone] I F: [phone]

## 2023-07-30 ENCOUNTER — Encounter: Payer: Medicare HMO | Admitting: Physical Therapy

## 2023-07-30 ENCOUNTER — Encounter: Payer: Self-pay | Admitting: Physical Therapy

## 2023-07-30 ENCOUNTER — Ambulatory Visit: Payer: Medicare HMO | Admitting: Physical Therapy

## 2023-07-30 DIAGNOSIS — R262 Difficulty in walking, not elsewhere classified: Secondary | ICD-10-CM | POA: Diagnosis not present

## 2023-07-30 DIAGNOSIS — M5459 Other low back pain: Secondary | ICD-10-CM | POA: Diagnosis not present

## 2023-07-30 NOTE — Therapy (Signed)
OUTPATIENT PHYSICAL THERAPY TREATMENT / PROGRESS NOTE / RE-CERTIFICATION Dates of reporting from 05/21/2023 to 07/30/2023   Patient Name: Madeline Young MRN: [id number] DOB:[birth year], 67 y.o., female Today's Date: 07/30/2023  END OF SESSION:  PT End of Session - 07/30/23 2026     Visit Number 10    Number of Visits 20    Date for PT Re-Evaluation 10/22/23    Authorization Type [employer] 2024 reporting period from 05/21/2023    Progress Note Due on Visit 10    PT Start Time 1037    PT Stop Time 1115    PT Time Calculation (min) 38 min    Activity Tolerance Patient tolerated treatment well;No increased pain    Behavior During Therapy [facility] for tasks assessed/performed                Past Medical History:  Diagnosis Date   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   ADHD (attention deficit hyperactivity disorder)    Allergy    Anxiety    Arthritis    Cataract    bil cateracts removed   Complication of anesthesia    first colonoscopy pt states she woke up   Depression    GERD (gastroesophageal reflux disease)    Globus sensation    Hyperlipidemia    Incontinence    bowels at times   Insomnia    Multinodular goiter (nontoxic)    Psoriatic arthritis (HCC)    Spinal stenosis of lumbar region 03/10/2015   MRI    Statin intolerance 01/27/2013   Past Surgical History:  Procedure Laterality Date   BACK SURGERY  02/17/2016   Dr. Leotis Shames- L4-L5 fusion    BIOPSY THYROID  2014   COLONOSCOPY     COLONOSCOPY W/ POLYPECTOMY     EYE SURGERY     bilateral cataract surgery w/ lens implant   FOOT SURGERY  2006   Right foot , secondary to severe loss of joint [facility])   POLYPECTOMY     TONSILLECTOMY     UPPER GASTROINTESTINAL ENDOSCOPY  2005   With empiric esophageal dilation   Patient Active Problem List   Diagnosis Date Noted   Slac (scapholunate advanced collapse) of wrist, left 06/03/2023   Mass of abdomen 05/23/2023   Urge incontinence 05/16/2022    Onychomycosis 08/17/2021   Drug allergy 06/29/2021   Fibromyalgia 06/29/2021   Generalized osteoarthritis 06/29/2021   Hand joint pain 06/29/2021   Other intervertebral disc degeneration, lumbar region 06/29/2021   Other long term (current) drug therapy 06/29/2021   Sciatica 01/28/2020   Rhinitis, non-allergic 11/10/2019   Insomnia 08/10/2019   Anxiety 10/11/2018   Carotid artery stenosis, asymptomatic, bilateral 10/29/2016   ADD (attention deficit disorder) 10/29/2016   Spondylolisthesis of lumbar region 02/17/2016   Globus sensation since at least 2005 04/29/2015   GERD (gastroesophageal reflux disease) 11/17/2014   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   Travel advice encounter 01/27/2013   Multinodular goiter (nontoxic) 08/11/2012   Overweight (BMI 25.0-29.9) 08/11/2012   Psoriatic arthritis, destructive type (HCC)    Screening for cervical cancer 08/21/2011   Screening for breast cancer 08/21/2011   Hx of adenomatous polyp of colon 10/06/2008   Familial hyperlipidemia 09/15/2008   Major depressive disorder, single episode, in remission (HCC) 09/15/2008   ALLERGIC RHINITIS 09/15/2008    PCP: Dr. Duncan Dull   REFERRING PROVIDER: Duke Salvia NP   REFERRING DIAG: Spinal stenosis of lumbar region  Rationale for Evaluation and Treatment: Rehabilitation  THERAPY DIAG:  Other low back pain  Difficulty in walking, not elsewhere classified  ONSET DATE: 2017   PERTINENT HISTORY:  Pt reports that she has three different types of arthritis: osteo, rheumatoid, and psoriatic. She had lumbar fusion in 2017 which she felt did not help significantly. She had an infusion for her psoriatic arthritis this past spring and it caused her low back pain. She recently had a spinal injection and this resolved radiating pain on her LLE, but she still has excruciating, radiating pain down her right lower extremity. She feels unsteady most of the time and she has to use walker to keep from falling. Pt  has also been told that she needs a reverse total shoulder because right shoulder RTC damage.   SUBJECTIVE:                                                                                                                                                                                           SUBJECTIVE STATEMENT: Patient states her pain is high this morning. She has pain in the low back, right hip, and down to the foot on the right (this morning). She also has a little left hip pain. She feels like physical therapy is helping her. She states she is hoping that it is helping. She always feels goods when she leaves and feels like she is doing the right thing for her body. Patient reports she does her HEP every other day.   Loves being outside, walking.   PAIN:  Are you having pain? NPRS: 7/10 low back right hip/lateral thigh, left hip  PRECAUTIONS: None  PATIENT GOALS: To improve balance and to decrease   OBJECTIVE  SELF-REPORTED FUNCTION FOTO score: 39/100 (lumbar questionnaire)  FUNCTIONAL/BALANCE TESTS 6 Minute Walk Test: 805 feet with SPC in right UE used, correctly.   10 Meter Walk Test: 0.82 m/s   [facility] PT Assessment - 07/30/23 0001       Standardized Balance Assessment   Standardized Balance Assessment Dynamic Gait Index      Dynamic Gait Index   Level Surface Mild Impairment   self-selected pace 7.78 seconds, fast: 7.13 seconds   Change in Gait Speed Mild Impairment    Gait with Horizontal Head Turns Mild Impairment    Gait with Vertical Head Turns Mild Impairment    Gait and Pivot Turn Mild Impairment    Step Over Obstacle Moderate Impairment    Step Around Obstacles Normal    Steps Moderate Impairment    Total Score 15    DGI comment: Scores of 19 or less are predictive of falls in older community living adults  TODAY'S TREATMENT:                                                         Neuromuscular Re-education: to improve, balance, postural  strength, muscle activation patterns, and stabilization strength required for functional activities: - Dynamic Gait Index testing (see above) - 10 Meter Walk Test testing (see above) - ambulation over ground with trial of SPC in right and left UE (patient feels more secure with cane in R UE).  - Education on diagnosis, prognosis, POC, progress, anatomy and physiology of current condition.   Pt required multimodal cuing for proper technique and to facilitate improved neuromuscular control, strength, range of motion, and functional ability resulting in improved performance and form.  PATIENT EDUCATION:  Education details: form and technique for correct performance of exercise. How to use SPC correctly and safely  Person educated: Patient Education method: Explanation, Demonstration, Verbal cues, and Handouts Education comprehension: verbalized understanding, returned demonstration, and verbal cues required  HOME EXERCISE PROGRAM: Access Code: [email or web address] URL: [email or web address] Date: 07/24/2023 Prepared by: Norton Blizzard  Exercises - Sit to Stand  - 3-4 x weekly - 2 sets - 7-10 reps - Standing Lumbar Extension with Counter  - 4 x daily - 1 sets - 10-15 reps - 1 second hold  HOME EXERCISE PROGRAM [5JLRY9U] View at "[email or web address]" using code: 5JLRY9U Supine Marching (phase 2) - 'up up, down down' -  Repeat 10 Repetitions, Complete 2 Sets, Perform 2 Times a Day  SCIATIC NERVE GLIDE -  Repeat 15 Repetitions, Complete 1 Set, Perform 2    ASSESSMENT:  CLINICAL IMPRESSION: Patient  has attended 10 physical therapy session since starting current episode of care on 05/21/2023. Patient had a long break in care for almost 6 weeks, starting back to PT on 07/18/2023 after transferring to an Cert. MDT PT. She has met her short term goal of participating in HEP and walking correctly with [facility] but has made little progress towards other goals. She has not yet reached her rehab  potential and would benefit from more consistent visits that have been scheduled and she appears prepared to attend. She continues to have difficulty with prolonged standing consistent with spinal stenosis with claudication, chronic multil-joint pain consistent with inflammatory arthropathy, and decreased balance that puts her at risk for falls and injury. Plan to continue with physical therapy for 10 more visits at a rate of 1-2/week.  Patient would benefit from continued management of limiting condition by skilled physical therapist to address remaining impairments and functional limitations to work towards stated goals and return to PLOF or maximal functional independence.   OBJECTIVE IMPAIRMENTS: Abnormal gait, decreased balance, decreased endurance, decreased knowledge of use of DME, decreased mobility, difficulty walking, decreased ROM, decreased strength, hypomobility, impaired flexibility, impaired sensation, impaired UE functional use, and pain.   ACTIVITY LIMITATIONS: carrying, lifting, bending, standing, squatting, sleeping, stairs, bed mobility, bathing, toileting, dressing, reach over head, hygiene/grooming, and locomotion level  PARTICIPATION LIMITATIONS: cleaning, laundry, driving, shopping, community activity, and yard work  PERSONAL FACTORS: Past/current experiences, Time since onset of injury/illness/exacerbation, and 3+ comorbidities: psoriatic and rheumatoid arthritis,   are also affecting patient's functional outcome.   REHAB POTENTIAL: Fair chronicity of low back and multiple co-morbidities  CLINICAL DECISION MAKING: Evolving/moderate complexity  EVALUATION COMPLEXITY: Moderate   GOALS: Goals reviewed with  patient? No  SHORT TERM GOALS: Target date: 06/05/2023  Pt will be independent with HEP in order to improve strength and balance in order to decrease fall risk and improve function at home and work. Baseline: NT 06/05/23: Able to perform independently  Goal status:  Achieved   2.  Pt will increase by at least 0.13 m/s in order to demonstrate clinically significant improvement in community ambulation.  Baseline:  0.81 m/sec; 0.82 m/s (07/30/2023);  Goal status: ONGOING   3.  Pt will increase by at least 1m (171ft) in order to demonstrate clinically significant improvement in cardiopulmonary endurance and community ambulation Baseline:  800 ft; 805 feet with SPC (07/30/2023);  Goal status: Ongoing   4.  Patient will show correct use of single point cane and rollator for improved ambulation and to decrease risk of falling.  Baseline: NT 06/04/23: Using SPC  Goal status: ACHIEVED    LONG TERM GOALS: Target date: 07/31/2023. Target date updated to 10/22/2023 for all unmet goals on 07/30/2023.   Patient will have improved function and activity level as evidenced by an increase in FOTO score by 10 points or more.  Baseline: 40/100 with target of 47: 39 at visit #10 (07/30/2023);  Goal status: ONGOING   2.  Patient will ambulate >=1000 ft for as evidence of improved aerobic endurance and lumbar function.  Baseline: 800 ft; 805 feet with SPC (07/30/2023);  Goal status: ONGOING   3. Patient will perform 10 meter walk test in >=1 m/sec for improved mobility and to decrease her risk of falling. Baseline: 0.81 m/sec; 0.82 m/s (07/30/2023);  Goal status: ONGOING   4.  Patient will demonstrate an improvement in DGI score of >=2 pts as evidence of improved dynamic balance to decrease risk of falling (Pardasaney et al, 2012). Baseline: 14/24; 15/24 (07/30/2023);  Goal status: ONGOING   PLAN:  PT FREQUENCY: 1-2x/week  PT DURATION: 12 weeks  PLANNED INTERVENTIONS: Therapeutic exercises, Therapeutic activity, Neuromuscular re-education, Balance training, Gait training, Patient/Family education, Self Care, Joint mobilization, Joint manipulation, Stair training, Vestibular training, Canalith repositioning, DME instructions, Aquatic Therapy, Dry  Needling, Electrical stimulation, Spinal manipulation, Spinal mobilization, Cryotherapy, Moist heat, Traction, Ultrasound, Manual therapy, and Re-evaluation.  PLAN FOR NEXT SESSION: update HEP as appropriate, progressive LE/trunk/functional strengthening, motor control, and dynamic balance exercises.    Luretha Murphy. Ilsa Iha, PT, DPT, Cert. MDT 07/30/23, 8:41 PM  [facility] [address] [place], [place] [address] P: [phone] I F: [phone]

## 2023-08-01 ENCOUNTER — Ambulatory Visit: Payer: Medicare HMO | Admitting: Physical Therapy

## 2023-08-01 ENCOUNTER — Encounter: Payer: Self-pay | Admitting: Physical Therapy

## 2023-08-01 DIAGNOSIS — M5459 Other low back pain: Secondary | ICD-10-CM | POA: Diagnosis not present

## 2023-08-01 DIAGNOSIS — D2261 Melanocytic nevi of right upper limb, including shoulder: Secondary | ICD-10-CM | POA: Diagnosis not present

## 2023-08-01 DIAGNOSIS — L24A2 Irritant contact dermatitis due to fecal, urinary or dual incontinence: Secondary | ICD-10-CM | POA: Diagnosis not present

## 2023-08-01 DIAGNOSIS — D225 Melanocytic nevi of trunk: Secondary | ICD-10-CM | POA: Diagnosis not present

## 2023-08-01 DIAGNOSIS — D2272 Melanocytic nevi of left lower limb, including hip: Secondary | ICD-10-CM | POA: Diagnosis not present

## 2023-08-01 DIAGNOSIS — R262 Difficulty in walking, not elsewhere classified: Secondary | ICD-10-CM

## 2023-08-01 DIAGNOSIS — L03011 Cellulitis of right finger: Secondary | ICD-10-CM | POA: Diagnosis not present

## 2023-08-01 DIAGNOSIS — D2262 Melanocytic nevi of left upper limb, including shoulder: Secondary | ICD-10-CM | POA: Diagnosis not present

## 2023-08-01 DIAGNOSIS — L821 Other seborrheic keratosis: Secondary | ICD-10-CM | POA: Diagnosis not present

## 2023-08-01 DIAGNOSIS — D2271 Melanocytic nevi of right lower limb, including hip: Secondary | ICD-10-CM | POA: Diagnosis not present

## 2023-08-01 DIAGNOSIS — L84 Corns and callosities: Secondary | ICD-10-CM | POA: Diagnosis not present

## 2023-08-01 DIAGNOSIS — L570 Actinic keratosis: Secondary | ICD-10-CM | POA: Diagnosis not present

## 2023-08-01 NOTE — Therapy (Signed)
OUTPATIENT PHYSICAL THERAPY TREATMENT    Patient Name: Madeline Young MRN: [id number] DOB:Nov 28, 1955, 67 y.o., female Today's Date: 08/01/2023  END OF SESSION:  PT End of Session - 08/01/23 1203     Visit Number 11    Number of Visits 20    Date for PT Re-Evaluation 10/22/23    Authorization Type [employer] 2024 reporting period from  07/30/2023    Progress Note Due on Visit 10    PT Start Time 1140    PT Stop Time 1219    PT Time Calculation (min) 39 min    Activity Tolerance Patient tolerated treatment well;No increased pain    Behavior During Therapy [facility] for tasks assessed/performed             Past Medical History:  Diagnosis Date   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   ADHD (attention deficit hyperactivity disorder)    Allergy    Anxiety    Arthritis    Cataract    bil cateracts removed   Complication of anesthesia    first colonoscopy pt states she woke up   Depression    GERD (gastroesophageal reflux disease)    Globus sensation    Hyperlipidemia    Incontinence    bowels at times   Insomnia    Multinodular goiter (nontoxic)    Psoriatic arthritis (HCC)    Spinal stenosis of lumbar region 03/10/2015   MRI    Statin intolerance 01/27/2013   Past Surgical History:  Procedure Laterality Date   BACK SURGERY  02/17/2016   Dr. Leotis Shames- L4-L5 fusion    BIOPSY THYROID  2014   COLONOSCOPY     COLONOSCOPY W/ POLYPECTOMY     EYE SURGERY     bilateral cataract surgery w/ lens implant   FOOT SURGERY  2006   Right foot , secondary to severe loss of joint [facility])   POLYPECTOMY     TONSILLECTOMY     UPPER GASTROINTESTINAL ENDOSCOPY  2005   With empiric esophageal dilation   Patient Active Problem List   Diagnosis Date Noted   Slac (scapholunate advanced collapse) of wrist, left 06/03/2023   Mass of abdomen 05/23/2023   Urge incontinence 05/16/2022   Onychomycosis 08/17/2021   Drug allergy 06/29/2021   Fibromyalgia 06/29/2021   Generalized  osteoarthritis 06/29/2021   Hand joint pain 06/29/2021   Other intervertebral disc degeneration, lumbar region 06/29/2021   Other long term (current) drug therapy 06/29/2021   Sciatica 01/28/2020   Rhinitis, non-allergic 11/10/2019   Insomnia 08/10/2019   Anxiety 10/11/2018   Carotid artery stenosis, asymptomatic, bilateral 10/29/2016   ADD (attention deficit disorder) 10/29/2016   Spondylolisthesis of lumbar region 02/17/2016   Globus sensation since at least 2005 04/29/2015   GERD (gastroesophageal reflux disease) 11/17/2014   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   Travel advice encounter 01/27/2013   Multinodular goiter (nontoxic) 08/11/2012   Overweight (BMI 25.0-29.9) 08/11/2012   Psoriatic arthritis, destructive type (HCC)    Screening for cervical cancer 08/21/2011   Screening for breast cancer 08/21/2011   Hx of adenomatous polyp of colon 10/06/2008   Familial hyperlipidemia 09/15/2008   Major depressive disorder, single episode, in remission (HCC) 09/15/2008   ALLERGIC RHINITIS 09/15/2008    PCP: Dr. Duncan Dull   REFERRING PROVIDER: Duke Salvia NP   REFERRING DIAG: Spinal stenosis of lumbar region  Rationale for Evaluation and Treatment: Rehabilitation  THERAPY DIAG:  Other low back pain  Difficulty in walking, not elsewhere classified  ONSET  DATE: 2017   PERTINENT HISTORY:  Pt reports that she has three different types of arthritis: osteo, rheumatoid, and psoriatic. She had lumbar fusion in 2017 which she felt did not help significantly. She had an infusion for her psoriatic arthritis this past spring and it caused her low back pain. She recently had a spinal injection and this resolved radiating pain on her LLE, but she still has excruciating, radiating pain down her right lower extremity. She feels unsteady most of the time and she has to use walker to keep from falling. Pt has also been told that she needs a reverse total shoulder because right shoulder RTC damage.    SUBJECTIVE:                                                                                                                                                                                           SUBJECTIVE STATEMENT: Patient states she thought her appointment was at 11:30. She states she continues to have pain and difficulty with mobility.   Loves being outside, walking.   PAIN:  Are you having pain? Yes  NPRS: 5/10 in low back  PRECAUTIONS: None  PATIENT GOALS: To improve balance and to decrease   OBJECTIVE   TODAY'S TREATMENT:                                                         Therapeutic exercise: to centralize symptoms and improve ROM, strength, muscular endurance, and activity tolerance required for successful completion of functional activities.  - seated auto-elongation schroth style on clear theraball with two long PVC sticks and mirror feedback. ~ 6 minutes with constant cuing for posture and to improve muscle activation around the spine and for breathing. Patient with improving lat activation and coordination by end of set.  - seated pallof press with GTB while seated on clear theraball, 1x15 each side, 1x8 with band anchored on right. Discontinued due to right shoulder pain.  - seated (on clear tharaball) single arm row, 1x10 with GTB with left shoulder, 2x15 each side with BlackTB (improved shoulder tolerance while still feeling work in the core/trunk). Cuing for improved posture and how to perform at home.  - Education on HEP including handout   Unbilled at pt request with intermittent supervision:  - NuStep level 2 using bilateral upper and lower extremities. Seat/handle setting 8/9. For improved extremity mobility, muscular endurance, and activity tolerance; and to induce the analgesic effect  of aerobic exercise, stimulate improved joint nutrition, x 10  minutes. Average SPM = 52.   Pt required multimodal cuing for proper technique and to facilitate improved  neuromuscular control, strength, range of motion, and functional ability resulting in improved performance and form.  PATIENT EDUCATION:  Education details: form and technique for correct performance of exercise. How to use SPC correctly and safely  Person educated: Patient Education method: Explanation, Demonstration, Verbal cues, and Handouts Education comprehension: verbalized understanding, returned demonstration, and verbal cues required  HOME EXERCISE PROGRAM: Access Code: [email or web address] URL: [email or web address] Date: 08/01/2023 Prepared by: Norton Blizzard  Exercises - Sit to Stand  - 3-4 x weekly - 2 sets - 7-10 reps - Walking with Head Nod  - 3-4 x weekly - 3 sets - 10 reps - Standing March with Counter Support  - 3-4 x weekly - 3 sets - 10 reps - Standing Toe Taps  - 3-4 x weekly - 3 sets - 10 reps - Standing Lumbar Extension with Counter  - 4 x daily - 1 sets - 10-15 reps - 1 second hold - Seated Single Arm Shoulder Row with Anchored Resistance  - 3-7 x weekly - 2-3 sets - 15 reps - 2 seconds hold  HOME EXERCISE PROGRAM [5JLRY9U] View at "[email or web address]" using code: [id number] Supine Marching (phase 2) - 'up up, down down' -  Repeat 10 Repetitions, Complete 2 Sets, Perform 2 Times a Day  SCIATIC NERVE GLIDE -  Repeat 15 Repetitions, Complete 1 Set, Perform 2    ASSESSMENT:  CLINICAL IMPRESSION: Patient arrives late today thinking her appointment was 15 min later than it was and PT not notified of her arrival until later than that. Session shortened accordingly and schedule reviewed to improve next visit. Session focused on core strengthening and endurance to improve active support of lumbar spine. Patient was limited in pallof press due to right shoulder pain but was able to tolerate single arm row better. Patient found auto-elongation exercise unfulfilling but fatiguing. Patient requested to use nustep unsupervised when time for visit was over, but she was  intermittently supervised by PT (unbilled time). Patient would benefit from continued management of limiting condition by skilled physical therapist to address remaining impairments and functional limitations to work towards stated goals and return to PLOF or maximal functional independence.   OBJECTIVE IMPAIRMENTS: Abnormal gait, decreased balance, decreased endurance, decreased knowledge of use of DME, decreased mobility, difficulty walking, decreased ROM, decreased strength, hypomobility, impaired flexibility, impaired sensation, impaired UE functional use, and pain.   ACTIVITY LIMITATIONS: carrying, lifting, bending, standing, squatting, sleeping, stairs, bed mobility, bathing, toileting, dressing, reach over head, hygiene/grooming, and locomotion level  PARTICIPATION LIMITATIONS: cleaning, laundry, driving, shopping, community activity, and yard work  PERSONAL FACTORS: Past/current experiences, Time since onset of injury/illness/exacerbation, and 3+ comorbidities: psoriatic and rheumatoid arthritis,   are also affecting patient's functional outcome.   REHAB POTENTIAL: Fair chronicity of low back and multiple co-morbidities  CLINICAL DECISION MAKING: Evolving/moderate complexity  EVALUATION COMPLEXITY: Moderate   GOALS: Goals reviewed with patient? No  SHORT TERM GOALS: Target date: 06/05/2023  Pt will be independent with HEP in order to improve strength and balance in order to decrease fall risk and improve function at home and work. Baseline: NT 06/05/23: Able to perform independently  Goal status: Achieved   2.  Pt will increase by at least 0.13 m/s in order to demonstrate clinically significant improvement in community ambulation.  Baseline:  0.81 m/sec; 0.82 m/s (07/30/2023);  Goal  status: ONGOING   3.  Pt will increase by at least 39m (136ft) in order to demonstrate clinically significant improvement in cardiopulmonary endurance and community ambulation Baseline:   800 ft; 805 feet with SPC (07/30/2023);  Goal status: Ongoing   4.  Patient will show correct use of single point cane and rollator for improved ambulation and to decrease risk of falling.  Baseline: NT 06/04/23: Using SPC  Goal status: ACHIEVED    LONG TERM GOALS: Target date: 07/31/2023. Target date updated to 10/22/2023 for all unmet goals on 07/30/2023.   Patient will have improved function and activity level as evidenced by an increase in FOTO score by 10 points or more.  Baseline: 40/100 with target of 47: 39 at visit #10 (07/30/2023);  Goal status: ONGOING   2.  Patient will ambulate >=1000 ft for as evidence of improved aerobic endurance and lumbar function.  Baseline: 800 ft; 805 feet with SPC (07/30/2023);  Goal status: ONGOING   3. Patient will perform 10 meter walk test in >=1 m/sec for improved mobility and to decrease her risk of falling. Baseline: 0.81 m/sec; 0.82 m/s (07/30/2023);  Goal status: ONGOING   4.  Patient will demonstrate an improvement in DGI score of >=2 pts as evidence of improved dynamic balance to decrease risk of falling (Pardasaney et al, 2012). Baseline: 14/24; 15/24 (07/30/2023);  Goal status: ONGOING   PLAN:  PT FREQUENCY: 1-2x/week  PT DURATION: 12 weeks  PLANNED INTERVENTIONS: Therapeutic exercises, Therapeutic activity, Neuromuscular re-education, Balance training, Gait training, Patient/Family education, Self Care, Joint mobilization, Joint manipulation, Stair training, Vestibular training, Canalith repositioning, DME instructions, Aquatic Therapy, Dry Needling, Electrical stimulation, Spinal manipulation, Spinal mobilization, Cryotherapy, Moist heat, Traction, Ultrasound, Manual therapy, and Re-evaluation.  PLAN FOR NEXT SESSION: update HEP as appropriate, progressive LE/trunk/functional strengthening, motor control, and dynamic balance exercises.    Luretha Murphy. Ilsa Iha, PT, DPT, Cert. MDT 08/01/23, 8:53 PM  [facility] [address] [place], [place] [address] P: [phone] I F: [phone]

## 2023-08-06 ENCOUNTER — Telehealth: Payer: Self-pay | Admitting: Internal Medicine

## 2023-08-06 MED ORDER — OXYCODONE-ACETAMINOPHEN 5-325 MG PO TABS: 1.0000 | ORAL_TABLET | Freq: Four times a day (QID) | ORAL | 0 refills | Status: DC | PRN

## 2023-08-06 NOTE — Telephone Encounter (Signed)
Refilled: 06/08/2023 Last OV: 05/16/2023 Next OV: 08/13/2023

## 2023-08-06 NOTE — Telephone Encounter (Signed)
Pt is aware.  

## 2023-08-06 NOTE — Telephone Encounter (Signed)
Patient just called and said she is almost out of her medication. The name is oxyCODONE-acetaminophen (PERCOCET/ROXICET) 5-325 MG tablet. She said she will be in a a day or two. She has an appointment coming up in a week. The pharmacy she use is [id number] [place], [place] - [address] [place] [address] Phone: [phone]  Fax: [phone]  Her number is [phone].

## 2023-08-07 ENCOUNTER — Ambulatory Visit: Payer: Medicare HMO | Admitting: Physical Therapy

## 2023-08-07 ENCOUNTER — Encounter: Payer: Self-pay | Admitting: Physical Therapy

## 2023-08-07 DIAGNOSIS — M5459 Other low back pain: Secondary | ICD-10-CM | POA: Diagnosis not present

## 2023-08-07 DIAGNOSIS — R262 Difficulty in walking, not elsewhere classified: Secondary | ICD-10-CM | POA: Diagnosis not present

## 2023-08-07 NOTE — Therapy (Signed)
OUTPATIENT PHYSICAL THERAPY TREATMENT    Patient Name: Madeline Young MRN: [id number] DOB:13-Apr-1956, 67 y.o., female Today's Date: 08/07/2023  END OF SESSION:  PT End of Session - 08/07/23 2036     Visit Number 12    Number of Visits 20    Date for PT Re-Evaluation 10/22/23    Authorization Type [employer] 2024 reporting period from  07/30/2023    Progress Note Due on Visit 10    PT Start Time 1123    PT Stop Time 1205    PT Time Calculation (min) 42 min    Activity Tolerance Patient tolerated treatment well;No increased pain    Behavior During Therapy [facility] for tasks assessed/performed              Past Medical History:  Diagnosis Date   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   ADHD (attention deficit hyperactivity disorder)    Allergy    Anxiety    Arthritis    Cataract    bil cateracts removed   Complication of anesthesia    first colonoscopy pt states she woke up   Depression    GERD (gastroesophageal reflux disease)    Globus sensation    Hyperlipidemia    Incontinence    bowels at times   Insomnia    Multinodular goiter (nontoxic)    Psoriatic arthritis (HCC)    Spinal stenosis of lumbar region 03/10/2015   MRI    Statin intolerance 01/27/2013   Past Surgical History:  Procedure Laterality Date   BACK SURGERY  02/17/2016   Dr. Leotis Shames- L4-L5 fusion    BIOPSY THYROID  2014   COLONOSCOPY     COLONOSCOPY W/ POLYPECTOMY     EYE SURGERY     bilateral cataract surgery w/ lens implant   FOOT SURGERY  2006   Right foot , secondary to severe loss of joint [facility])   POLYPECTOMY     TONSILLECTOMY     UPPER GASTROINTESTINAL ENDOSCOPY  2005   With empiric esophageal dilation   Patient Active Problem List   Diagnosis Date Noted   Slac (scapholunate advanced collapse) of wrist, left 06/03/2023   Mass of abdomen 05/23/2023   Urge incontinence 05/16/2022   Onychomycosis 08/17/2021   Drug allergy 06/29/2021   Fibromyalgia 06/29/2021   Generalized  osteoarthritis 06/29/2021   Hand joint pain 06/29/2021   Other intervertebral disc degeneration, lumbar region 06/29/2021   Other long term (current) drug therapy 06/29/2021   Sciatica 01/28/2020   Rhinitis, non-allergic 11/10/2019   Insomnia 08/10/2019   Anxiety 10/11/2018   Carotid artery stenosis, asymptomatic, bilateral 10/29/2016   ADD (attention deficit disorder) 10/29/2016   Spondylolisthesis of lumbar region 02/17/2016   Globus sensation since at least 2005 04/29/2015   GERD (gastroesophageal reflux disease) 11/17/2014   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   Travel advice encounter 01/27/2013   Multinodular goiter (nontoxic) 08/11/2012   Overweight (BMI 25.0-29.9) 08/11/2012   Psoriatic arthritis, destructive type (HCC)    Screening for cervical cancer 08/21/2011   Screening for breast cancer 08/21/2011   Hx of adenomatous polyp of colon 10/06/2008   Familial hyperlipidemia 09/15/2008   Major depressive disorder, single episode, in remission (HCC) 09/15/2008   ALLERGIC RHINITIS 09/15/2008    PCP: Dr. Duncan Dull   REFERRING PROVIDER: Duke Salvia NP   REFERRING DIAG: Spinal stenosis of lumbar region  Rationale for Evaluation and Treatment: Rehabilitation  THERAPY DIAG:  Other low back pain  Difficulty in walking, not elsewhere classified  ONSET DATE: 2017   PERTINENT HISTORY:  Pt reports that she has three different types of arthritis: osteo, rheumatoid, and psoriatic. She had lumbar fusion in 2017 which she felt did not help significantly. She had an infusion for her psoriatic arthritis this past spring and it caused her low back pain. She recently had a spinal injection and this resolved radiating pain on her LLE, but she still has excruciating, radiating pain down her right lower extremity. She feels unsteady most of the time and she has to use walker to keep from falling. Pt has also been told that she needs a reverse total shoulder because right shoulder RTC damage.    SUBJECTIVE:                                                                                                                                                                                           SUBJECTIVE STATEMENT: Patient states she is doing pretty good today but she is concerned because she only has one pain pill left and the pharmacy had to order he refill. She states she felt a little energized like she usually does after last PT session. She was in [place] since last PT session. She had a couple of bad days. She is very limited in what she can do and has trouble making adjustments when she is not at home. She states she did 25 sit to stands yesterday 12 at a time.   Loves being outside, walking.   PAIN:  Are you having pain? Yes  NPRS: 4/10 in low back  PRECAUTIONS: None  PATIENT GOALS: To improve balance and to decrease   OBJECTIVE   TODAY'S TREATMENT:                                                         Neuromuscular Re-education: to improve, balance, postural strength, muscle activation patterns, and stabilization strength required for functional activities: - seated auto-elongation schroth style on clear theraball with two long PVC sticks and mirror feedback. ~ 5 minutes with constant cuing for posture and to improve muscle activation around the spine and for breathing. Patient demo persistent elevation of scapula with inhalation despite multimodal cuing.  - quadruped on elbows, diaphragmatic breathing attempting to expand posterior rib cage, 1x10 (minimal change in upper trap use with breathing in seated).  - seated (on clear tharaball) single arm row, 2x15 each side with BlackTB (improved shoulder tolerance while still feeling work  in the core/trunk). Cuing for improved posture and how to perform at home.  - seated (on clear theraball), march with hold, 1x5 each side with 5 second hold and pauses for cuing to help patient improve balance and posture - seated (on  clear theraball) single arm row with ipsilateral foot held off the floor, 1x10 each side following several attempts and cuing for improved posture and position. (Patient with contralateral lean, improved with cuing to plant standing foot with isometric hamstring curl and bring lifted knee towards contralateral shoulder). SBA-minA - step to single leg "stick"  1x8 each side on airex pad with U UE support to hover above TM bar and CGA-minA - step to single leg "stick" 1x9 each side to firm surface with U UE support to hover above TM bar and CGA-minA (Needed seated rest due to legs feeling weak before last set).  - Education on HEP including handout   Pt required multimodal cuing for proper technique and to facilitate improved neuromuscular control, strength, range of motion, and functional ability resulting in improved performance and form.  PATIENT EDUCATION:  Education details: form and technique for correct performance of exercise. How to use SPC correctly and safely  Person educated: Patient Education method: Explanation, Demonstration, Verbal cues, and Handouts Education comprehension: verbalized understanding, returned demonstration, and verbal cues required  HOME EXERCISE PROGRAM: Access Code: [email or web address] URL: [email or web address] Date: 08/01/2023 Prepared by: Norton Blizzard  Exercises - Sit to Stand  - 3-4 x weekly - 2 sets - 7-10 reps - Walking with Head Nod  - 3-4 x weekly - 3 sets - 10 reps - Standing March with Counter Support  - 3-4 x weekly - 3 sets - 10 reps - Standing Toe Taps  - 3-4 x weekly - 3 sets - 10 reps - Standing Lumbar Extension with Counter  - 4 x daily - 1 sets - 10-15 reps - 1 second hold - Seated Single Arm Shoulder Row with Anchored Resistance  - 3-7 x weekly - 2-3 sets - 15 reps - 2 seconds hold  HOME EXERCISE PROGRAM [[id number]] View at "[email or web address]" using code: [id number] Supine Marching (phase 2) - 'up up, down down' -  Repeat 10  Repetitions, Complete 2 Sets, Perform 2 Times a Day  SCIATIC NERVE GLIDE -  Repeat 15 Repetitions, Complete 1 Set, Perform 2    ASSESSMENT:  CLINICAL IMPRESSION: Patient arrives with [facility] reporting usual amount of pain in low back but good tolerance for last PT session. Today's session continued working on improving active spine stability and posture with autoelongation exercise, breathing exercises, and core stabilization using exercise ball and progressing complexity of limb motion and stability. Patient required significant cuing but demonstrated improving motor control compared to last PT session. Balance exercises were also included with progression to dynamic modified single leg stance exercise. Patient required one seated rest during standing exercises due to her legs beginning to feel weak. Patient would benefit from continued management of limiting condition by skilled physical therapist to address remaining impairments and functional limitations to work towards stated goals and return to PLOF or maximal functional independence.   OBJECTIVE IMPAIRMENTS: Abnormal gait, decreased balance, decreased endurance, decreased knowledge of use of DME, decreased mobility, difficulty walking, decreased ROM, decreased strength, hypomobility, impaired flexibility, impaired sensation, impaired UE functional use, and pain.   ACTIVITY LIMITATIONS: carrying, lifting, bending, standing, squatting, sleeping, stairs, bed mobility, bathing, toileting, dressing, reach over head, hygiene/grooming, and locomotion level  PARTICIPATION LIMITATIONS: cleaning, laundry, driving, shopping, community  activity, and yard work  PERSONAL FACTORS: Past/current experiences, Time since onset of injury/illness/exacerbation, and 3+ comorbidities: psoriatic and rheumatoid arthritis,   are also affecting patient's functional outcome.   REHAB POTENTIAL: Fair chronicity of low back and multiple co-morbidities  CLINICAL DECISION  MAKING: Evolving/moderate complexity  EVALUATION COMPLEXITY: Moderate   GOALS: Goals reviewed with patient? No  SHORT TERM GOALS: Target date: 06/05/2023  Pt will be independent with HEP in order to improve strength and balance in order to decrease fall risk and improve function at home and work. Baseline: NT 06/05/23: Able to perform independently  Goal status: Achieved   2.  Pt will increase by at least 0.13 m/s in order to demonstrate clinically significant improvement in community ambulation.  Baseline:  0.81 m/sec; 0.82 m/s (07/30/2023);  Goal status: ONGOING   3.  Pt will increase by at least 55m (138ft) in order to demonstrate clinically significant improvement in cardiopulmonary endurance and community ambulation Baseline:  800 ft; 805 feet with SPC (07/30/2023);  Goal status: Ongoing   4.  Patient will show correct use of single point cane and rollator for improved ambulation and to decrease risk of falling.  Baseline: NT 06/04/23: Using SPC  Goal status: ACHIEVED    LONG TERM GOALS: Target date: 07/31/2023. Target date updated to 10/22/2023 for all unmet goals on 07/30/2023.   Patient will have improved function and activity level as evidenced by an increase in FOTO score by 10 points or more.  Baseline: 40/100 with target of 47: 39 at visit #10 (07/30/2023);  Goal status: ONGOING   2.  Patient will ambulate >=1000 ft for as evidence of improved aerobic endurance and lumbar function.  Baseline: 800 ft; 805 feet with SPC (07/30/2023);  Goal status: ONGOING   3. Patient will perform 10 meter walk test in >=1 m/sec for improved mobility and to decrease her risk of falling. Baseline: 0.81 m/sec; 0.82 m/s (07/30/2023);  Goal status: ONGOING   4.  Patient will demonstrate an improvement in DGI score of >=2 pts as evidence of improved dynamic balance to decrease risk of falling (Pardasaney et al, 2012). Baseline: 14/24; 15/24 (07/30/2023);  Goal status:  ONGOING   PLAN:  PT FREQUENCY: 1-2x/week  PT DURATION: 12 weeks  PLANNED INTERVENTIONS: Therapeutic exercises, Therapeutic activity, Neuromuscular re-education, Balance training, Gait training, Patient/Family education, Self Care, Joint mobilization, Joint manipulation, Stair training, Vestibular training, Canalith repositioning, DME instructions, Aquatic Therapy, Dry Needling, Electrical stimulation, Spinal manipulation, Spinal mobilization, Cryotherapy, Moist heat, Traction, Ultrasound, Manual therapy, and Re-evaluation.  PLAN FOR NEXT SESSION: update HEP as appropriate, progressive LE/trunk/functional strengthening, motor control, and dynamic balance exercises.    Luretha Murphy. Ilsa Iha, PT, DPT, Cert. MDT 08/07/23, 8:43 PM  [facility] [address] [place], [place] [address] P: [phone] I F: [phone]

## 2023-08-08 DIAGNOSIS — R202 Paresthesia of skin: Secondary | ICD-10-CM | POA: Diagnosis not present

## 2023-08-08 DIAGNOSIS — M25551 Pain in right hip: Secondary | ICD-10-CM | POA: Diagnosis not present

## 2023-08-08 DIAGNOSIS — L4050 Arthropathic psoriasis, unspecified: Secondary | ICD-10-CM | POA: Diagnosis not present

## 2023-08-08 DIAGNOSIS — R29898 Other symptoms and signs involving the musculoskeletal system: Secondary | ICD-10-CM | POA: Diagnosis not present

## 2023-08-08 DIAGNOSIS — Z7952 Long term (current) use of systemic steroids: Secondary | ICD-10-CM | POA: Diagnosis not present

## 2023-08-08 DIAGNOSIS — R200 Anesthesia of skin: Secondary | ICD-10-CM | POA: Diagnosis not present

## 2023-08-09 ENCOUNTER — Encounter: Payer: Medicare HMO | Admitting: Physical Therapy

## 2023-08-13 ENCOUNTER — Encounter: Payer: Self-pay | Admitting: Internal Medicine

## 2023-08-13 ENCOUNTER — Ambulatory Visit (INDEPENDENT_AMBULATORY_CARE_PROVIDER_SITE_OTHER): Payer: Medicare HMO | Admitting: Internal Medicine

## 2023-08-13 VITALS — BP 138/64 | HR 88 | Ht 66.0 in | Wt 156.0 lb

## 2023-08-13 DIAGNOSIS — G8929 Other chronic pain: Secondary | ICD-10-CM

## 2023-08-13 DIAGNOSIS — M19132 Post-traumatic osteoarthritis, left wrist: Secondary | ICD-10-CM

## 2023-08-13 DIAGNOSIS — Z79899 Other long term (current) drug therapy: Secondary | ICD-10-CM | POA: Diagnosis not present

## 2023-08-13 DIAGNOSIS — L4052 Psoriatic arthritis mutilans: Secondary | ICD-10-CM

## 2023-08-13 DIAGNOSIS — T391X1S Poisoning by 4-Aminophenol derivatives, accidental (unintentional), sequela: Secondary | ICD-10-CM | POA: Diagnosis not present

## 2023-08-13 DIAGNOSIS — N3941 Urge incontinence: Secondary | ICD-10-CM | POA: Diagnosis not present

## 2023-08-13 DIAGNOSIS — M4316 Spondylolisthesis, lumbar region: Secondary | ICD-10-CM | POA: Diagnosis not present

## 2023-08-13 MED ORDER — SOLIFENACIN SUCCINATE 10 MG PO TABS: 10.0000 mg | ORAL_TABLET | Freq: Every day | ORAL | 2 refills | Status: DC

## 2023-08-13 NOTE — Progress Notes (Unsigned)
Subjective:  Patient ID: Madeline Young, female    DOB: 04/26/1956  Age: 67 y.o. MRN: [id number]  CC: {There were no encounter diagnoses. (Refresh or delete this SmartLink)}   HPI [place] D Mondo presents for  Chief Complaint  Patient presents with   Medical Management of Chronic Issues    Myriad pain complaints.   1) Chronic joint pain ,complicated by disk herniation resulting in  sciatica .has painful morning episodes of right hip pain from the lumbar spine to the lateral thigh lasting 1.5 hours she is waiting for neurosrgery's referral to the [facility] to happen for a nerve block .  The consultation is scheduled for December. Has had 2 ESI's which helped minimally    Taking oxycodone 4 times daily with 2 tylenol each dose .  Nocturia x 3, with urge incontinence  has been present for several months . No dysuria.  Has developed an inguinal rash attributes it to the incontinence pads she is wearing   Bursitis of right elbow improving   Numbness of thun and index on left hand  .  Waiting for NCS supposedly ordered by   Near fall wrenched right shoulder again.  Told her rotator cuff was gone.    Outpatient Medications Prior to Visit  Medication Sig Dispense Refill   aspirin 81 MG tablet Take 81 mg by mouth daily.     b complex vitamins capsule Take 1 capsule by mouth daily.     calcium carbonate (OS-CAL - DOSED IN MG OF ELEMENTAL CALCIUM) 1250 (500 Ca) MG tablet      celecoxib (CELEBREX) 200 MG capsule TAKE 1 CAPSULE BY MOUTH TWICE A DAY 180 capsule 1   cholecalciferol (VITAMIN D) 1000 UNITS tablet Take 2,000 Units by mouth daily.     clobetasol (TEMOVATE) 0.05 % external solution as needed.     diclofenac Sodium (VOLTAREN) 1 % GEL APPLY 2 GRAMS TO AFFECTED AREA 4 TIMES A DAY 300 g 3   DULoxetine (CYMBALTA) 60 MG capsule Take 1 capsule (60 mg total) by mouth daily. (Patient taking differently: Take 120 mg by mouth daily.) 90 capsule 0   fluocinonide ointment  (LIDEX) 0.05 % APPLY TWICE DAILY TO AFFECTED AREAS UNTIL IMPROVED THEN AS NEEDED FOR FLARES     furosemide (LASIX) 20 MG tablet TAKE 1 TABLET BY MOUTH EVERY DAY AS NEEDED 90 tablet 0   ipratropium (ATROVENT) 0.03 % nasal spray Place into both nostrils.     leflunomide (ARAVA) 20 MG tablet Take 20 mg by mouth daily.     melatonin 5 MG TABS Take 5 mg by mouth at bedtime.     modafinil (PROVIGIL) 200 MG tablet Take 1 tablet (200 mg total) by mouth daily. TAKE 1 (200 MG) TABLET EACH MORNING. 30 tablet 1   Multiple Vitamin (MULTIVITAMIN) tablet Take 1 tablet by mouth daily.     nystatin-triamcinolone ointment (MYCOLOG) Apply 1 application topically 2 (two) times daily. 30 g 1   Omega-3 Fatty Acids (FISH OIL) 1200 MG CAPS Take 1,200 mg by mouth daily.      oxyCODONE-acetaminophen (PERCOCET/ROXICET) 5-325 MG tablet Take 1 tablet by mouth every 6 (six) hours as needed for moderate pain (pain score 4-6). Do not refill less than 30 days from prior refill 120 tablet 0   Polyethyl Glycol-Propyl Glycol 0.4-0.3 % SOLN Place 1 drop into both eyes daily as needed (for dry eyes).     predniSONE (DELTASONE) 10 MG tablet Take 10 mg by  mouth 2 (two) times daily. Taking 15mg  currently due to flare     Probiotic Product (PROBIOTIC DAILY PO) Take 1 capsule by mouth daily.     triamcinolone cream (KENALOG) 0.1 % APPLY TO AFFECTED AREA TWICE A DAY UNTIL CLEAR THEN AS NEEDED     trospium (SANCTURA) 20 MG tablet Take 1 tablet (20 mg total) by mouth 2 (two) times daily. 180 tablet 1   Turmeric 500 MG TABS Take 1 tablet by mouth daily.     alendronate (FOSAMAX) 70 MG tablet Take 70 mg by mouth once a week. (Patient not taking: Reported on 08/13/2023)     magnesium 30 MG tablet Take 30 mg by mouth daily. (Patient not taking: Reported on 08/13/2023)     No facility-administered medications prior to visit.    Review of Systems;  Patient denies headache, fevers, malaise, unintentional weight loss, skin rash, eye pain, sinus  congestion and sinus pain, sore throat, dysphagia,  hemoptysis , cough, dyspnea, wheezing, chest pain, palpitations, orthopnea, edema, abdominal pain, nausea, melena, diarrhea, constipation, flank pain, dysuria, hematuria, urinary  Frequency, nocturia, numbness, tingling, seizures,  Focal weakness, Loss of consciousness,  Tremor, insomnia, depression, anxiety, and suicidal ideation.      Objective:  BP 138/64   Pulse 88   Ht 5\' 6"  (1.676 m)   Wt 156 lb (70.8 kg)   SpO2 99%   BMI 25.18 kg/m   BP Readings from Last 3 Encounters:  08/13/23 138/64  05/16/23 (!) 142/68  02/15/23 (!) 157/72    Wt Readings from Last 3 Encounters:  08/13/23 156 lb (70.8 kg)  05/16/23 155 lb 3.2 oz (70.4 kg)  02/07/23 160 lb 6.4 oz (72.8 kg)    Physical Exam  Lab Results  Component Value Date   HGBA1C 6.1 01/31/2022    Lab Results  Component Value Date   CREATININE 0.75 05/16/2023   CREATININE 0.83 11/09/2022   CREATININE 0.72 11/09/2021    Lab Results  Component Value Date   WBC 6.6 05/16/2023   HGB 11.3 (L) 05/16/2023   HCT 34.7 (L) 05/16/2023   PLT 326.0 05/16/2023   GLUCOSE 110 (H) 05/16/2023   CHOL 365 (H) 11/09/2022   TRIG 81.0 11/09/2022   HDL 71.40 11/09/2022   LDLDIRECT 256.0 11/09/2022   LDLCALC 277 (H) 11/09/2022   ALT 22 05/16/2023   AST 21 05/16/2023   NA 139 05/16/2023   K 4.4 05/16/2023   CL 101 05/16/2023   CREATININE 0.75 05/16/2023   BUN 23 05/16/2023   CO2 29 05/16/2023   TSH 0.96 01/31/2022   INR 1.0 02/07/2023   HGBA1C 6.1 01/31/2022   MICROALBUR 1.7 01/31/2022    CT ABDOMEN PELVIS W CONTRAST  Result Date: 05/29/2023 CLINICAL DATA:  Lower abdominal and pelvic swelling and firmness on exam. Suspected mass or hernia. EXAM: CT ABDOMEN AND PELVIS WITH CONTRAST TECHNIQUE: Multidetector CT imaging of the abdomen and pelvis was performed using the standard protocol following bolus administration of intravenous contrast. RADIATION DOSE REDUCTION: This exam was  performed according to the departmental dose-optimization program which includes automated exposure control, adjustment of the mA and/or kV according to patient size and/or use of iterative reconstruction technique. CONTRAST:  OMNIPAQUE IOHEXOL 300 MG/ML  SOLN COMPARISON:  09/27/2009 FINDINGS: Lower Chest: No acute findings. Hepatobiliary:  No suspicious hepatic masses identified. Pancreas:  No mass or inflammatory changes. Spleen: Within normal limits in size and appearance. Adrenals/Urinary Tract: 8 cm benign Bosniak category 2 cyst seen in lower  pole of right kidney. No suspicious masses identified. No evidence of ureteral calculi or hydronephrosis. Stomach/Bowel: No evidence of obstruction, inflammatory process or abnormal fluid collections. Normal appendix visualized. Diverticulosis is seen mainly involving the sigmoid colon, however there is no evidence of diverticulitis. Vascular/Lymphatic: No pathologically enlarged lymph nodes. No acute vascular findings. Reproductive:  No mass or other significant abnormality. Other:  None. Musculoskeletal: No suspicious bone lesions identified. Advanced lumbar spine degenerative changes noted with fusion hardware at L4-5 and L5-S1. IMPRESSION: No acute findings.  No evidence of mass or hernia. 8 cm benign Bosniak category 2 cyst in lower pole of right kidney (No followup imaging is recommended). Colonic diverticulosis, without radiographic evidence of diverticulitis. Electronically Signed   By: Danae Orleans M.D.   On: 05/29/2023 17:01    Assessment & Plan:  .There are no diagnoses linked to this encounter.   I provided 30 minutes of face-to-face time during this encounter reviewing patient's last visit with me, patient's  most recent visit with cardiology,  nephrology,  and neurology,  recent surgical and non surgical procedures, previous  labs and imaging studies, counseling on currently addressed issues,  and post visit ordering to diagnostics and  therapeutics .   Follow-up: No follow-ups on file.   Sherlene Shams, MD

## 2023-08-13 NOTE — Assessment & Plan Note (Signed)
SHE IS HAVING NOCTURIA X 3  AND URGE INCONTINENCE DESPITE TRIAL OF SANCTURIA .  CHANGING TO VESICARE 10 MG . RULING OUT UTI TODAY

## 2023-08-13 NOTE — Patient Instructions (Addendum)
You have been using in excess of 5000 mg ( 5g ) of tylenol daily based on your current regimen.  The maximum daily dose for CHRONIC LONG TERM USE IS 2000 MG TO AVOID LIVER DAMAGE.   You should not take more than 700 mg of tylenol in addition to the amount in your oxycodone tablets.    If your pain is not controlled as well after this reduction,  let me know and I will change your pain medication   Changing bladder medication to Vesicare.  Stop the trospium

## 2023-08-13 NOTE — Assessment & Plan Note (Signed)
Managed by [facility]..  currently taking celebrex , arava and 10 mg prednisone daily. Pain management with oxycodone [facility]

## 2023-08-14 ENCOUNTER — Encounter: Payer: Self-pay | Admitting: Physical Therapy

## 2023-08-14 ENCOUNTER — Ambulatory Visit: Payer: Medicare HMO | Attending: Orthopedic | Admitting: Physical Therapy

## 2023-08-14 DIAGNOSIS — G8929 Other chronic pain: Secondary | ICD-10-CM | POA: Insufficient documentation

## 2023-08-14 DIAGNOSIS — T391X1S Poisoning by 4-Aminophenol derivatives, accidental (unintentional), sequela: Secondary | ICD-10-CM

## 2023-08-14 DIAGNOSIS — R262 Difficulty in walking, not elsewhere classified: Secondary | ICD-10-CM | POA: Insufficient documentation

## 2023-08-14 DIAGNOSIS — M5459 Other low back pain: Secondary | ICD-10-CM | POA: Diagnosis not present

## 2023-08-14 HISTORY — DX: Poisoning by 4-aminophenol derivatives, accidental (unintentional), sequela: T39.1X1S

## 2023-08-14 LAB — URINALYSIS, ROUTINE W REFLEX MICROSCOPIC
Bilirubin Urine: NEGATIVE
Hgb urine dipstick: NEGATIVE
Ketones, ur: NEGATIVE
Leukocytes,Ua: NEGATIVE
Nitrite: NEGATIVE
RBC / HPF: NONE SEEN (ref 0–?)
Specific Gravity, Urine: 1.02 (ref 1.000–1.030)
Total Protein, Urine: NEGATIVE
Urine Glucose: NEGATIVE
Urobilinogen, UA: 0.2 (ref 0.0–1.0)
WBC, UA: NONE SEEN (ref 0–?)
pH: 7.5 (ref 5.0–8.0)

## 2023-08-14 LAB — URINE CULTURE
MICRO NUMBER:: 15682091
SPECIMEN QUALITY:: ADEQUATE

## 2023-08-14 NOTE — Assessment & Plan Note (Signed)
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  She is frustrated because she feels that her pain was ignored by rheumatology for 2 years and may  have been operable if worked up 2 years ago.  She is Awaiting Pain Clinic evaluation to discuss possible nerve ablation.

## 2023-08-14 NOTE — Assessment & Plan Note (Signed)
With new onset numbness of thumb and index fingers .  [place] studies ordered by Rheumatology.

## 2023-08-14 NOTE — Therapy (Signed)
OUTPATIENT PHYSICAL THERAPY TREATMENT    Patient Name: Madeline Young MRN: [id number] DOB:May 23, 1956, 67 y.o., female Today's Date: 08/14/2023  END OF SESSION:  PT End of Session - 08/14/23 1208     Visit Number 13    Number of Visits 20    Date for PT Re-Evaluation 10/22/23    Authorization Type [employer] 2024 reporting period from  07/30/2023    Progress Note Due on Visit 10    PT Start Time 1122    PT Stop Time 1205    PT Time Calculation (min) 43 min    Activity Tolerance Patient tolerated treatment well;No increased pain    Behavior During Therapy [facility] for tasks assessed/performed             Past Medical History:  Diagnosis Date   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   ADHD (attention deficit hyperactivity disorder)    Allergy    Anxiety    Arthritis    Cataract    bil cateracts removed   Complication of anesthesia    first colonoscopy pt states she woke up   Depression    GERD (gastroesophageal reflux disease)    Globus sensation    Hyperlipidemia    Incontinence    bowels at times   Insomnia    Multinodular goiter (nontoxic)    Psoriatic arthritis (HCC)    Spinal stenosis of lumbar region 03/10/2015   MRI    Statin intolerance 01/27/2013   Past Surgical History:  Procedure Laterality Date   BACK SURGERY  02/17/2016   Dr. Leotis Shames- L4-L5 fusion    BIOPSY THYROID  2014   COLONOSCOPY     COLONOSCOPY W/ POLYPECTOMY     EYE SURGERY     bilateral cataract surgery w/ lens implant   FOOT SURGERY  2006   Right foot , secondary to severe loss of joint [facility])   POLYPECTOMY     TONSILLECTOMY     UPPER GASTROINTESTINAL ENDOSCOPY  2005   With empiric esophageal dilation   Patient Active Problem List   Diagnosis Date Noted   Unintentional Tylenol overdose, sequela 08/14/2023   Chronic pain not due to malignancy 08/14/2023   Slac (scapholunate advanced collapse) of wrist, left 06/03/2023   Mass of abdomen 05/23/2023   Urge incontinence 05/16/2022    Onychomycosis 08/17/2021   Drug allergy 06/29/2021   Fibromyalgia 06/29/2021   Generalized osteoarthritis 06/29/2021   Hand joint pain 06/29/2021   Other intervertebral disc degeneration, lumbar region 06/29/2021   Other long term (current) drug therapy 06/29/2021   Sciatica 01/28/2020   Rhinitis, non-allergic 11/10/2019   Insomnia 08/10/2019   Anxiety 10/11/2018   Carotid artery stenosis, asymptomatic, bilateral 10/29/2016   ADD (attention deficit disorder) 10/29/2016   Spondylolisthesis of lumbar region 02/17/2016   Globus sensation since at least 2005 04/29/2015   GERD (gastroesophageal reflux disease) 11/17/2014   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   Travel advice encounter 01/27/2013   Multinodular goiter (nontoxic) 08/11/2012   Overweight (BMI 25.0-29.9) 08/11/2012   Psoriatic arthritis, destructive type (HCC)    Screening for cervical cancer 08/21/2011   Screening for breast cancer 08/21/2011   Hx of adenomatous polyp of colon 10/06/2008   Familial hyperlipidemia 09/15/2008   Major depressive disorder, single episode, in remission (HCC) 09/15/2008   ALLERGIC RHINITIS 09/15/2008    PCP: Dr. Duncan Dull   REFERRING PROVIDER: Duke Salvia NP   REFERRING DIAG: Spinal stenosis of lumbar region  Rationale for Evaluation and Treatment: Rehabilitation  THERAPY DIAG:  Other low back pain  Difficulty in walking, not elsewhere classified  ONSET DATE: 2017   PERTINENT HISTORY:  Pt reports that she has three different types of arthritis: osteo, rheumatoid, and psoriatic. She had lumbar fusion in 2017 which she felt did not help significantly. She had an infusion for her psoriatic arthritis this past spring and it caused her low back pain. She recently had a spinal injection and this resolved radiating pain on her LLE, but she still has excruciating, radiating pain down her right lower extremity. She feels unsteady most of the time and she has to use walker to keep from falling. Pt  has also been told that she needs a reverse total shoulder because right shoulder RTC damage.   SUBJECTIVE:                                                                                                                                                                                           SUBJECTIVE STATEMENT: Patient states she is doing okay but she did have another fall. She states she is turning too fast and that is leading to the falls. She turned to the left and lost her balance and she landed on her left side. She states she is trying to be more conscientious. Both times she was using her walker but she took two steps away from it to the counter and when she turned to go back to walker she fell. She was able to get up by herself and was mad. She is sore on her left leg but she is not sure if it is from that fall or not. She worked hard over the weekend. She is putting some voltaren cream in her right lower leg.   Loves being outside, walking.   PAIN:  Are you having pain? Yes  NPRS: 6/10 in right hip and lower leg, and left thigh.   PRECAUTIONS: None  PATIENT GOALS: To improve balance and to decrease   OBJECTIVE   TODAY'S TREATMENT:                                                         Neuromuscular Re-education: to improve, balance, postural strength, muscle activation patterns, and stabilization strength required for functional activities: - seated auto-elongation schroth style on clear theraball with two long PVC sticks and mirror feedback. ~ 5 minutes with constant cuing for posture and to improve muscle activation around the spine  and for breathing.  - seated (on clear theraball), march with hold, 1x5 each side with 5-10 second hold and pauses for cuing to help patient improve balance and posture - attempted seated (on clear theraball) single arm row with ipsilateral foot held off floor (R side) but unable to perform multiple reps.  - seated (on clear tharaball) single arm  row, 3x110 each side with 07/23/14#cable. Cuing for improved posture.  - seated (on clear theraball) single arm row with ipsilateral foot on cone for intermediate between double leg and single leg stance, 1x10 each side following several attempts and cuing for improved posture and position.  SBA - seated (on clear theraball) single arm row with ipsilateral foot held off the floor, 3x10 each side following several attempts and cuing for improved posture and position. (Patient with contralateral lean, improved with cuing) SBA-minA - staggered stance dead lift with contralateral UE holding 10# KB picking it up from 9 inch stool.  Heavy cuing for hip hinge. Unable to get optimal weight bearing distribution but significant improvement with hip hinge with cuing, reported no pain during but stated by end of 2nd side that she felt increased pain in left LE that she thought was normal.  CGA  Pt required multimodal cuing for proper technique and to facilitate improved neuromuscular control, strength, range of motion, and functional ability resulting in improved performance and form.  PATIENT EDUCATION:  Education details: form and technique for correct performance of exercise. How to use SPC correctly and safely  Person educated: Patient Education method: Explanation, Demonstration, Verbal cues, and Handouts Education comprehension: verbalized understanding, returned demonstration, and verbal cues required  HOME EXERCISE PROGRAM: Access Code: [email or web address] URL: [email or web address] Date: 08/01/2023 Prepared by: Norton Blizzard  Exercises - Sit to Stand  - 3-4 x weekly - 2 sets - 7-10 reps - Walking with Head Nod  - 3-4 x weekly - 3 sets - 10 reps - Standing March with Counter Support  - 3-4 x weekly - 3 sets - 10 reps - Standing Toe Taps  - 3-4 x weekly - 3 sets - 10 reps - Standing Lumbar Extension with Counter  - 4 x daily - 1 sets - 10-15 reps - 1 second hold - Seated Single Arm Shoulder Row  with Anchored Resistance  - 3-7 x weekly - 2-3 sets - 15 reps - 2 seconds hold  HOME EXERCISE PROGRAM [[id number]] View at "[email or web address]" using code: [id number] Supine Marching (phase 2) - 'up up, down down' -  Repeat 10 Repetitions, Complete 2 Sets, Perform 2 Times a Day  SCIATIC NERVE GLIDE -  Repeat 15 Repetitions, Complete 1 Set, Perform 2    ASSESSMENT:  CLINICAL IMPRESSION: Patient arrives with [facility] reporting another fall when leaving AD behind and mild increased soreness in left LE. Today's session continued to build on motor control and core strengthening to improve lumbar support during functional activities. She demonstrated significant gains in motor control and balance with seated row with ipsilateral march by end of session and was able to progress to modified staggered stance deadlift with improved core brace and hip hinge. She continues to struggle to maintain posture consistently throughout her movements and postures which demonstrates decreased endurance and kinesthetic awareness which likely contributes to patient's feeling of weakness in her low back during functional activities. Plan to continue with progressions as tolerated next session. Patient would benefit from continued management of limiting condition by skilled physical therapist to address remaining impairments and functional limitations to work towards  stated goals and return to PLOF or maximal functional independence.    OBJECTIVE IMPAIRMENTS: Abnormal gait, decreased balance, decreased endurance, decreased knowledge of use of DME, decreased mobility, difficulty walking, decreased ROM, decreased strength, hypomobility, impaired flexibility, impaired sensation, impaired UE functional use, and pain.   ACTIVITY LIMITATIONS: carrying, lifting, bending, standing, squatting, sleeping, stairs, bed mobility, bathing, toileting, dressing, reach over head, hygiene/grooming, and locomotion level  PARTICIPATION LIMITATIONS:  cleaning, laundry, driving, shopping, community activity, and yard work  PERSONAL FACTORS: Past/current experiences, Time since onset of injury/illness/exacerbation, and 3+ comorbidities: psoriatic and rheumatoid arthritis,   are also affecting patient's functional outcome.   REHAB POTENTIAL: Fair chronicity of low back and multiple co-morbidities  CLINICAL DECISION MAKING: Evolving/moderate complexity  EVALUATION COMPLEXITY: Moderate   GOALS: Goals reviewed with patient? No  SHORT TERM GOALS: Target date: 06/05/2023  Pt will be independent with HEP in order to improve strength and balance in order to decrease fall risk and improve function at home and work. Baseline: NT 06/05/23: Able to perform independently  Goal status: Achieved   2.  Pt will increase by at least 0.13 m/s in order to demonstrate clinically significant improvement in community ambulation.  Baseline:  0.81 m/sec; 0.82 m/s (07/30/2023);  Goal status: ONGOING   3.  Pt will increase by at least 34m (130ft) in order to demonstrate clinically significant improvement in cardiopulmonary endurance and community ambulation Baseline:  800 ft; 805 feet with SPC (07/30/2023);  Goal status: Ongoing   4.  Patient will show correct use of single point cane and rollator for improved ambulation and to decrease risk of falling.  Baseline: NT 06/04/23: Using SPC  Goal status: ACHIEVED    LONG TERM GOALS: Target date: 07/31/2023. Target date updated to 10/22/2023 for all unmet goals on 07/30/2023.   Patient will have improved function and activity level as evidenced by an increase in FOTO score by 10 points or more.  Baseline: 40/100 with target of 47: 39 at visit #10 (07/30/2023);  Goal status: ONGOING   2.  Patient will ambulate >=1000 ft for as evidence of improved aerobic endurance and lumbar function.  Baseline: 800 ft; 805 feet with SPC (07/30/2023);  Goal status: ONGOING   3. Patient will perform 10 meter  walk test in >=1 m/sec for improved mobility and to decrease her risk of falling. Baseline: 0.81 m/sec; 0.82 m/s (07/30/2023);  Goal status: ONGOING   4.  Patient will demonstrate an improvement in DGI score of >=2 pts as evidence of improved dynamic balance to decrease risk of falling (Pardasaney et al, 2012). Baseline: 14/24; 15/24 (07/30/2023);  Goal status: ONGOING   PLAN:  PT FREQUENCY: 1-2x/week  PT DURATION: 12 weeks  PLANNED INTERVENTIONS: Therapeutic exercises, Therapeutic activity, Neuromuscular re-education, Balance training, Gait training, Patient/Family education, Self Care, Joint mobilization, Joint manipulation, Stair training, Vestibular training, Canalith repositioning, DME instructions, Aquatic Therapy, Dry Needling, Electrical stimulation, Spinal manipulation, Spinal mobilization, Cryotherapy, Moist heat, Traction, Ultrasound, Manual therapy, and Re-evaluation.  PLAN FOR NEXT SESSION: update HEP as appropriate, progressive LE/trunk/functional strengthening, motor control, and dynamic balance exercises.    Luretha Murphy. Ilsa Iha, PT, DPT, Cert. MDT 08/14/23, 12:22 PM  [facility] [address] [place], [place] [address] P: [phone] I F: [phone]

## 2023-08-14 NOTE — Assessment & Plan Note (Signed)
She has been using in excess of 4 grams daily for over 6 months.  Fortunately her liver enzymes have been normal and were checked < 2 weeks ago.  Advised to reduce total daily ingestion to 2000 mg.  May require change in narcotics regimen to manage pain

## 2023-08-14 NOTE — Assessment & Plan Note (Signed)
Refill history confirmed via [place] Controlled Substance databas, accessed by me today..  new contract signed to authorize use of oxycodone 4 daily /120 per month

## 2023-08-16 ENCOUNTER — Encounter: Payer: Self-pay | Admitting: Physical Therapy

## 2023-08-16 ENCOUNTER — Ambulatory Visit: Payer: Medicare HMO | Admitting: Physical Therapy

## 2023-08-16 DIAGNOSIS — R262 Difficulty in walking, not elsewhere classified: Secondary | ICD-10-CM

## 2023-08-16 DIAGNOSIS — M5459 Other low back pain: Secondary | ICD-10-CM

## 2023-08-16 NOTE — Therapy (Signed)
OUTPATIENT PHYSICAL THERAPY TREATMENT    Patient Name: Madeline Young MRN: [id number] DOB:1956/02/11, 67 y.o., female Today's Date: 08/16/2023  END OF SESSION:  PT End of Session - 08/16/23 1131     Visit Number 14    Number of Visits 20    Date for PT Re-Evaluation 10/22/23    Authorization Type [employer] 2024 reporting period from  07/30/2023    Progress Note Due on Visit 10    PT Start Time 1122    PT Stop Time 1200    PT Time Calculation (min) 38 min    Activity Tolerance Patient tolerated treatment well;No increased pain    Behavior During Therapy [facility] for tasks assessed/performed              Past Medical History:  Diagnosis Date   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   ADHD (attention deficit hyperactivity disorder)    Allergy    Anxiety    Arthritis    Cataract    bil cateracts removed   Complication of anesthesia    first colonoscopy pt states she woke up   Depression    GERD (gastroesophageal reflux disease)    Globus sensation    Hyperlipidemia    Incontinence    bowels at times   Insomnia    Multinodular goiter (nontoxic)    Psoriatic arthritis (HCC)    Spinal stenosis of lumbar region 03/10/2015   MRI    Statin intolerance 01/27/2013   Past Surgical History:  Procedure Laterality Date   BACK SURGERY  02/17/2016   Dr. Leotis Shames- L4-L5 fusion    BIOPSY THYROID  2014   COLONOSCOPY     COLONOSCOPY W/ POLYPECTOMY     EYE SURGERY     bilateral cataract surgery w/ lens implant   FOOT SURGERY  2006   Right foot , secondary to severe loss of joint [facility])   POLYPECTOMY     TONSILLECTOMY     UPPER GASTROINTESTINAL ENDOSCOPY  2005   With empiric esophageal dilation   Patient Active Problem List   Diagnosis Date Noted   Unintentional Tylenol overdose, sequela 08/14/2023   Chronic pain not due to malignancy 08/14/2023   Slac (scapholunate advanced collapse) of wrist, left 06/03/2023   Mass of abdomen 05/23/2023   Urge incontinence  05/16/2022   Onychomycosis 08/17/2021   Drug allergy 06/29/2021   Fibromyalgia 06/29/2021   Generalized osteoarthritis 06/29/2021   Hand joint pain 06/29/2021   Other intervertebral disc degeneration, lumbar region 06/29/2021   Other long term (current) drug therapy 06/29/2021   Sciatica 01/28/2020   Rhinitis, non-allergic 11/10/2019   Insomnia 08/10/2019   Anxiety 10/11/2018   Carotid artery stenosis, asymptomatic, bilateral 10/29/2016   ADD (attention deficit disorder) 10/29/2016   Spondylolisthesis of lumbar region 02/17/2016   Globus sensation since at least 2005 04/29/2015   GERD (gastroesophageal reflux disease) 11/17/2014   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   Travel advice encounter 01/27/2013   Multinodular goiter (nontoxic) 08/11/2012   Overweight (BMI 25.0-29.9) 08/11/2012   Psoriatic arthritis, destructive type (HCC)    Screening for cervical cancer 08/21/2011   Screening for breast cancer 08/21/2011   Hx of adenomatous polyp of colon 10/06/2008   Familial hyperlipidemia 09/15/2008   Major depressive disorder, single episode, in remission (HCC) 09/15/2008   ALLERGIC RHINITIS 09/15/2008    PCP: Dr. Duncan Dull   REFERRING PROVIDER: Duke Salvia NP   REFERRING DIAG: Spinal stenosis of lumbar region  Rationale for Evaluation and Treatment: Rehabilitation  THERAPY DIAG:  Other low back pain  Difficulty in walking, not elsewhere classified  ONSET DATE: 2017   PERTINENT HISTORY:  Pt reports that she has three different types of arthritis: osteo, rheumatoid, and psoriatic. She had lumbar fusion in 2017 which she felt did not help significantly. She had an infusion for her psoriatic arthritis this past spring and it caused her low back pain. She recently had a spinal injection and this resolved radiating pain on her LLE, but she still has excruciating, radiating pain down her right lower extremity. She feels unsteady most of the time and she has to use walker to keep from  falling. Pt has also been told that she needs a reverse total shoulder because right shoulder RTC damage.   SUBJECTIVE:                                                                                                                                                                                           SUBJECTIVE STATEMENT: Patient states she had a rough morning with difficulty getting up and a lot of pain. It is starting to feel a little better now. She thinks she might be getting a cold. She is meeting friends later for lunch. She felt okay after last PT session. She states she never really feels better after PT, but she did not feel worse.   Loves being outside, walking.   PAIN:  Are you having pain? Yes  NPRS: 6/10 left leg right lower abdomen  PRECAUTIONS: None  PATIENT GOALS: To improve balance and to decrease   OBJECTIVE   TODAY'S TREATMENT:                                                         Neuromuscular Re-education: to improve, balance, postural strength, muscle activation patterns, and stabilization strength required for functional activities: - seated (on clear tharaball) single arm row, 3x12 each side with 15#cable. Cuing for improved posture.  - seated (on clear theraball), march with hold, 1x10 each side with 5 second hold and pauses for cuing to help patient improve balance and posture - seated (on clear theraball) single arm row with ipsilateral foot held off the floor, 3x10 each side following several attempts and cuing for improved posture and position. SBA  Therapeutic exercise: to centralize symptoms and improve ROM, strength, muscular endurance, and activity tolerance required for successful completion of functional activities.  - staggered stance dead lift with contralateral  UE holding 10# KB picking it up from 8 inch stool.  2x10 each side SBA  - Education on HEP including handout (patient lost some of her previous handouts).   Pt required multimodal  cuing for proper technique and to facilitate improved neuromuscular control, strength, range of motion, and functional ability resulting in improved performance and form.  PATIENT EDUCATION:  Education details: form and technique for correct performance of exercise. How to use SPC correctly and safely  Person educated: Patient Education method: Explanation, Demonstration, Verbal cues, and Handouts Education comprehension: verbalized understanding, returned demonstration, and verbal cues required  HOME EXERCISE PROGRAM: Access Code: [email or web address] URL: [email or web address] Date: 08/16/2023 Prepared by: Norton Blizzard  Exercises - Sit to Stand  - 3-4 x weekly - 2 sets - 7-10 reps - Walking with Head Nod  - 3-4 x weekly - 3 sets - 10 reps - Standing March with Counter Support  - 3-4 x weekly - 3 sets - 10 reps - Standing Toe Taps  - 3-4 x weekly - 3 sets - 10 reps - Seated Single Arm Shoulder Row with Anchored Resistance  - 3-7 x weekly - 2-3 sets - 15 reps - 2 seconds hold  HOME EXERCISE PROGRAM [[id number]] View at [email or web address]" using code: [email or web address] Supine Marching (phase 2) - 'up up, down down' -  Repeat 10 Repetitions, Complete 2 Sets, Perform 2 Times a Day  SCIATIC NERVE GLIDE -  Repeat 15 Repetitions, Complete 1 Set, Perform 2    ASSESSMENT:  CLINICAL IMPRESSION: Patient [facility] with [facility] reporting increased fatigue since last PT session. She demonstrated improved motor control and core strength with ball exercises and was able to progress dead lift volume with fewer cues. She did need more rest breaks today due to increased fatigue but reported no increase in pain by end of session. Plan to continue with progressive core strengthening and functional strengthening as tolerated next session. Patient would benefit from continued management of limiting condition by skilled physical therapist to address remaining impairments and functional limitations to work towards stated  goals and return to PLOF or maximal functional independence.   OBJECTIVE IMPAIRMENTS: Abnormal gait, decreased balance, decreased endurance, decreased knowledge of use of DME, decreased mobility, difficulty walking, decreased ROM, decreased strength, hypomobility, impaired flexibility, impaired sensation, impaired UE functional use, and pain.   ACTIVITY LIMITATIONS: carrying, lifting, bending, standing, squatting, sleeping, stairs, bed mobility, bathing, toileting, dressing, reach over head, hygiene/grooming, and locomotion level  PARTICIPATION LIMITATIONS: cleaning, laundry, driving, shopping, community activity, and yard work  PERSONAL FACTORS: Past/current experiences, Time since onset of injury/illness/exacerbation, and 3+ comorbidities: psoriatic and rheumatoid arthritis,   are also affecting patient's functional outcome.   REHAB POTENTIAL: Fair chronicity of low back and multiple co-morbidities  CLINICAL DECISION MAKING: Evolving/moderate complexity  EVALUATION COMPLEXITY: Moderate   GOALS: Goals reviewed with patient? No  SHORT TERM GOALS: Target date: 06/05/2023  Pt will be independent with HEP in order to improve strength and balance in order to decrease fall risk and improve function at home and work. Baseline: NT 06/05/23: Able to perform independently  Goal status: Achieved   2.  Pt will increase by at least 0.13 m/s in order to demonstrate clinically significant improvement in community ambulation.  Baseline:  0.81 m/sec; 0.82 m/s (07/30/2023);  Goal status: ONGOING   3.  Pt will increase by at least 19m (170ft) in order to demonstrate clinically significant improvement in cardiopulmonary endurance and community ambulation Baseline:  800 ft; 805 feet with  SPC (07/30/2023);  Goal status: Ongoing   4.  Patient will show correct use of single point cane and rollator for improved ambulation and to decrease risk of falling.  Baseline: NT 06/04/23: Using SPC  Goal  status: ACHIEVED    LONG TERM GOALS: Target date: 07/31/2023. Target date updated to 10/22/2023 for all unmet goals on 07/30/2023.   Patient will have improved function and activity level as evidenced by an increase in FOTO score by 10 points or more.  Baseline: 40/100 with target of 47: 39 at visit #10 (07/30/2023);  Goal status: ONGOING   2.  Patient will ambulate >=1000 ft for as evidence of improved aerobic endurance and lumbar function.  Baseline: 800 ft; 805 feet with SPC (07/30/2023);  Goal status: ONGOING   3. Patient will perform 10 meter walk test in >=1 m/sec for improved mobility and to decrease her risk of falling. Baseline: 0.81 m/sec; 0.82 m/s (07/30/2023);  Goal status: ONGOING   4.  Patient will demonstrate an improvement in DGI score of >=2 pts as evidence of improved dynamic balance to decrease risk of falling (Pardasaney et al, 2012). Baseline: 14/24; 15/24 (07/30/2023);  Goal status: ONGOING   PLAN:  PT FREQUENCY: 1-2x/week  PT DURATION: 12 weeks  PLANNED INTERVENTIONS: Therapeutic exercises, Therapeutic activity, Neuromuscular re-education, Balance training, Gait training, Patient/Family education, Self Care, Joint mobilization, Joint manipulation, Stair training, Vestibular training, Canalith repositioning, DME instructions, Aquatic Therapy, Dry Needling, Electrical stimulation, Spinal manipulation, Spinal mobilization, Cryotherapy, Moist heat, Traction, Ultrasound, Manual therapy, and Re-evaluation.  PLAN FOR NEXT SESSION: update HEP as appropriate, progressive LE/trunk/functional strengthening, motor control, and dynamic balance exercises.    Luretha Murphy. Ilsa Iha, PT, DPT, Cert. MDT 08/16/23, 1:16 PM  [facility] [address] [place], [place] [address] P: [phone] I F: [phone]

## 2023-08-20 ENCOUNTER — Ambulatory Visit: Payer: Medicare HMO | Admitting: *Deleted

## 2023-08-20 VITALS — Ht 66.0 in | Wt 154.0 lb

## 2023-08-20 DIAGNOSIS — Z0000 Encounter for general adult medical examination without abnormal findings: Secondary | ICD-10-CM | POA: Diagnosis not present

## 2023-08-20 NOTE — Progress Notes (Signed)
Subjective:   Madeline Young is a 67 y.o. female who presents for Medicare Annual (Subsequent) preventive examination.  Visit Complete: Virtual I connected with  Madeline Young on 08/20/23 by a audio enabled telemedicine application and verified that I am speaking with the correct person using two identifiers.  Patient Location: Home  Provider Location: Office/Clinic  I discussed the limitations of evaluation and management by telemedicine. The patient expressed understanding and agreed to proceed.  Vital Signs: Because this visit was a virtual/telehealth visit, some criteria may be missing or patient reported. Any vitals not documented were not able to be obtained and vitals that have been documented are patient reported.   Cardiac Risk Factors include: advanced age (>25men, >57 women);dyslipidemia     Objective:    Today's Vitals   08/20/23 1109  Weight: 154 lb (69.9 kg)  Height: 5\' 6"  (1.676 m)  PainSc: 6    Body mass index is 24.86 kg/m.     08/20/2023   11:41 AM 05/22/2023   10:02 AM 08/17/2022   11:55 AM 06/29/2022    1:46 PM 07/19/2021   10:42 AM 07/16/2020   10:48 AM 07/16/2019   10:13 AM  Advanced Directives  Does Patient Have a Medical Advance Directive? Yes Yes Yes Yes Yes Yes Yes  Type of Estate agent of [place];Living will Living will Healthcare Power of [place];Living will  Healthcare Power of [place];Living will Healthcare Power of [place];Living will Healthcare Power of [place];Living will  Does patient want to make changes to medical advance directive? No - Patient declined No - Patient declined No - Patient declined  No - Patient declined No - Patient declined No - Patient declined  Copy of Healthcare Power of Attorney in Chart? Yes - validated most recent copy scanned in chart (See row information)  Yes - validated most recent copy scanned in chart (See row information)  Yes - validated most recent copy scanned in chart  (See row information) Yes - validated most recent copy scanned in chart (See row information) No - copy requested    Current Medications (verified) Outpatient Encounter Medications as of 08/20/2023  Medication Sig   acetaminophen (TYLENOL) 500 MG tablet Take 1,000 mg by mouth every 6 (six) hours as needed.   aspirin 81 MG tablet Take 81 mg by mouth daily.   b complex vitamins capsule Take 1 capsule by mouth daily.   calcium carbonate (OS-CAL - DOSED IN MG OF ELEMENTAL CALCIUM) 1250 (500 Ca) MG tablet    celecoxib (CELEBREX) 200 MG capsule TAKE 1 CAPSULE BY MOUTH TWICE A DAY   cholecalciferol (VITAMIN D) 1000 UNITS tablet Take 2,000 Units by mouth daily.   clobetasol (TEMOVATE) 0.05 % external solution as needed.   diclofenac Sodium (VOLTAREN) 1 % GEL APPLY 2 GRAMS TO AFFECTED AREA 4 TIMES A DAY   DULoxetine (CYMBALTA) 60 MG capsule Take 1 capsule (60 mg total) by mouth daily. (Patient taking differently: Take 120 mg by mouth daily.)   fluocinonide ointment (LIDEX) 0.05 % APPLY TWICE DAILY TO AFFECTED AREAS UNTIL IMPROVED THEN AS NEEDED FOR FLARES   ipratropium (ATROVENT) 0.03 % nasal spray Place into both nostrils.   leflunomide (ARAVA) 20 MG tablet Take 20 mg by mouth daily.   Magnesium 250 MG CAPS Take by mouth. Take two by mouth daily   melatonin 5 MG TABS Take 5 mg by mouth at bedtime.   Multiple Vitamin (MULTIVITAMIN) tablet Take 1 tablet by mouth daily.   Omega-3  Fatty Acids (FISH OIL) 1200 MG CAPS Take 1,200 mg by mouth daily.    oxyCODONE-acetaminophen (PERCOCET/ROXICET) 5-325 MG tablet Take 1 tablet by mouth every 6 (six) hours as needed for moderate pain (pain score 4-6). Do not refill less than 30 days from prior refill   Polyethyl Glycol-Propyl Glycol 0.4-0.3 % SOLN Place 1 drop into both eyes daily as needed (for dry eyes).   predniSONE (DELTASONE) 10 MG tablet Take 10 mg by mouth 2 (two) times daily. Taking 15mg  currently due to flare   Probiotic Product (PROBIOTIC DAILY PO)  Take 1 capsule by mouth daily.   solifenacin (VESICARE) 10 MG tablet Take 1 tablet (10 mg total) by mouth daily.   triamcinolone cream (KENALOG) 0.1 % APPLY TO AFFECTED AREA TWICE A DAY UNTIL CLEAR THEN AS NEEDED   Turmeric 500 MG TABS Take 1 tablet by mouth daily.   alendronate (FOSAMAX) 70 MG tablet Take 70 mg by mouth once a week. (Patient not taking: Reported on 08/20/2023)   furosemide (LASIX) 20 MG tablet TAKE 1 TABLET BY MOUTH EVERY DAY AS NEEDED (Patient not taking: Reported on 08/20/2023)   modafinil (PROVIGIL) 200 MG tablet Take 1 tablet (200 mg total) by mouth daily. TAKE 1 (200 MG) TABLET EACH MORNING. (Patient not taking: Reported on 08/20/2023)   nystatin-triamcinolone ointment (MYCOLOG) Apply 1 application topically 2 (two) times daily. (Patient not taking: Reported on 08/20/2023)   No facility-administered encounter medications on file as of 08/20/2023.    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 Leotis Shames- L4-L5 fusion    BIOPSY THYROID  2014   COLONOSCOPY     COLONOSCOPY W/ POLYPECTOMY     EYE SURGERY     bilateral cataract surgery w/ lens implant    FOOT SURGERY  2006   Right foot , secondary to severe loss of joint ([place])   POLYPECTOMY     TONSILLECTOMY     UPPER GASTROINTESTINAL ENDOSCOPY  2005   With empiric esophageal dilation   Family History  Problem Relation Age of Onset   Heart disease Mother    Hyperlipidemia Mother    Coronary artery disease Father 82       CABG in early 60's   Hyperlipidemia Father    Epilepsy Grandchild        Severe form   Colon cancer Neg Hx    Esophageal cancer Neg Hx    Rectal cancer Neg Hx    Stomach cancer Neg Hx    Colon polyps Neg Hx    Crohn's disease Neg Hx    Social History  Socioeconomic History   Marital status: Married    Spouse name: Not on file   Number of children: 2   Years of education: Not on file   Highest education level: Not on file  Occupational History   Occupation: disability  Tobacco Use   Smoking status: Former    Current packs/day: 0.00    Types: Cigarettes    Quit date: 08/21/1971    Years since quitting: 52.0    Passive exposure: Never   Smokeless tobacco: Never  Vaping Use   Vaping status: Never Used  Substance and Sexual Activity   Alcohol use: Yes    Alcohol/week: 7.0 standard drinks of alcohol    Types: 7 Glasses of wine per week    Comment: daily,wine   Drug use: No   Sexual activity: Not on file  Other Topics Concern   Not on file  Social History Narrative   Married, retired Charity fundraiser   2 daughters some grandchildren   1 caffeinated drinks daily   1 alcoholic beverage daily   No tobacco      Social Determinants of Health   Financial Resource Strain: Low Risk  (08/20/2023)   Overall Financial Resource Strain (CARDIA)    Difficulty of Paying Living Expenses: Not hard at all  Food Insecurity: No Food Insecurity (08/20/2023)   Hunger Vital Sign    Worried About Running Out of Food in the Last Year: Never true    Ran Out of Food in the Last Year: Never true  Transportation Needs: No Transportation Needs (08/20/2023)   PRAPARE -  Administrator, Civil Service (Medical): No    Lack of Transportation (Non-Medical): No  Physical Activity: Inactive (08/20/2023)   Exercise Vital Sign    Days of Exercise per Week: 0 days    Minutes of Exercise per Session: 0 min  Stress: Stress Concern Present (08/20/2023)   [employer] of Occupational Health - Occupational Stress Questionnaire    Feeling of Stress : Very much  Social Connections: Moderately Integrated (08/20/2023)   Social Connection and Isolation Panel [NHANES]    Frequency of Communication with Friends and Family: More than three times a week    Frequency of Social Gatherings with Friends and Family: Twice a week    Attends Religious Services: Never    Database administrator or Organizations: Yes    Attends Engineer, structural: More than 4 times per year    Marital Status: Married    Tobacco Counseling Counseling given: Not Answered   Clinical Intake:  Pre-visit preparation completed: Yes  Pain : 0-10 Pain Score: 6  Pain Type: Chronic pain Pain Location: Back (and hips) Pain Orientation: Lower Pain Descriptors / Indicators: Sore, Aching Pain Onset: More than a month ago Pain Frequency: Intermittent     BMI - recorded: 24.86 Nutritional Status: BMI of 19-24  Normal Nutritional Risks: None Diabetes: No  How often do you need to have someone help you when you read instructions, pamphlets, or other written materials from your doctor or pharmacy?: 1 - Never  Interpreter Needed?: No  Information entered by :: R. Saron Tweed LPN   Activities of Daily Living    08/20/2023   11:13 AM  In your present state of health, do you have any difficulty performing the following activities:  Hearing? 1  Comment some  Vision? 0  Comment glasses  Difficulty concentrating or making decisions? 0  Walking or climbing stairs? 1  Comment walker and cane  Dressing or bathing? 0  Doing errands, shopping? 1  Comment has limitations   Preparing Food and eating ? N  Using the Toilet? N  In the past six months, have you accidently leaked urine? Y  Comment pad  Do you have problems with loss of bowel control? Y  Managing your Medications? N  Managing your Finances? N  Housekeeping or managing your Housekeeping? Y  Comment husband helps    Patient Care Team: Sherlene Shams, MD as PCP - General (Internal Medicine)  Indicate any recent Medical Services you may have received from other than [facility] providers in the past year (date may be approximate).     Assessment:   This is a routine wellness examination for Leland.  Hearing/Vision screen Hearing Screening - Comments:: Some issues, mild Vision Screening - Comments:: glasses   Goals Addressed             This Visit's Progress    Patient Stated       Get pain under control and be able to walk        Depression Screen    08/20/2023   11:30 AM 08/13/2023    1:46 PM 05/16/2023    1:55 PM 02/07/2023    2:02 PM 11/09/2022   10:56 AM 08/17/2022   11:09 AM 08/09/2022    2:17 PM  PHQ 2/9 Scores  PHQ - 2 Score 1 1 4 2  0 0 0  PHQ- 9 Score 13  14 9 8       Fall Risk    08/20/2023   11:16 AM 08/13/2023    1:46 PM 05/16/2023    1:55 PM 02/07/2023    2:02 PM 11/09/2022   10:55 AM  Fall Risk   Falls in the past year? 1 1 1  0 0  Number falls in past yr: 1 1 1  0 0  Injury with Fall? 1 0 1 0 0  Risk for fall due to : History of fall(s);Impaired balance/gait History of fall(s) History of fall(s) No Fall Risks No Fall Risks  Follow up Falls evaluation completed;Falls prevention discussed Falls evaluation completed Falls evaluation completed Falls evaluation completed Falls evaluation completed    MEDICARE RISK AT HOME: Medicare Risk at Home Any stairs in or around the home?: Yes If so, are there any without handrails?: No Home free of loose throw rugs in walkways, pet beds, electrical cords, etc?: Yes Adequate lighting in your home to reduce risk of falls?:  Yes Life alert?: No Use of a cane, walker or w/c?: Yes Grab bars in the bathroom?: No Shower chair or bench in shower?: Yes Elevated toilet seat or a handicapped toilet?: Yes  Cognitive Function:    07/10/2018    1:20 PM  MMSE - Mini Mental State Exam  Orientation to time 5  Orientation to Place 5  Registration 3  Attention/ Calculation 5  Recall 3  Language- name 2 objects 2  Language- repeat 1  Language- follow 3 step command 3  Language- read & follow direction 1  Write a sentence 1  Copy design 1  Total score 30        08/20/2023   11:41 AM 08/17/2022   11:55 AM 07/16/2019   10:20 AM  6CIT Screen  What Year? 0 points 0 points 0 points  What month? 0 points 0 points 0 points  What time? 0 points 0 points 0 points  Count back from 20 0 points 0 points 0 points  Months in  reverse 0 points 0 points 0 points  Repeat phrase 2 points 0 points 0 points  Total Score 2 points 0 points 0 points    Immunizations Immunization History  Administered Date(s) Administered   Fluad Quad(high Dose 65+) 08/09/2020   Influenza Split 08/21/2011, 08/09/2012   Influenza, High Dose Seasonal PF 07/10/2018, 06/09/2019, 07/05/2021   Influenza-Unspecified 07/10/2016, 09/14/2017, 07/10/2022, 08/06/2023   PFIZER(Purple Top)SARS-COV-2 Vaccination 11/01/2019, 11/23/2019, 07/10/2020, 08/11/2022   PNEUMOCOCCAL CONJUGATE-20 06/25/2021   Pfizer Covid-19 Vaccine Bivalent Booster 67yrs & up 08/22/2021   Pneumococcal Conjugate-13 07/17/2014   Pneumococcal Polysaccharide-23 08/21/2011, 07/27/2016   Respiratory Syncytial Virus Vaccine,Recomb Aduvanted(Arexvy) 07/10/2022   Tdap 08/09/2012   Zoster Recombinant(Shingrix) 06/09/2019, 10/15/2019   Zoster, Live 09/09/2016    TDAP status: Due, Education has been provided regarding the importance of this vaccine. Advised may receive this vaccine at local pharmacy or Health Dept. Aware to provide a copy of the vaccination record if obtained from local  pharmacy or Health Dept. Verbalized acceptance and understanding.  Flu Vaccine status: Up to date  Pneumococcal vaccine status: Up to date  Covid-19 vaccine status: Information provided on how to obtain vaccines.   Qualifies for Shingles Vaccine? Yes   Zostavax completed Yes   Shingrix Completed?: Yes  Screening Tests Health Maintenance  Topic Date Due   DEXA SCAN  08/16/2019   DTaP/Tdap/Td (2 - Td or Tdap) 08/09/2022   MAMMOGRAM  10/26/2022   COVID-19 Vaccine (6 - 2023-24 season) 06/10/2023   Medicare Annual Wellness (AWV)  08/18/2023   Colonoscopy  05/25/2029   Pneumonia Vaccine 11+ Years old  Completed   INFLUENZA VACCINE  Completed   Hepatitis C Screening  Completed   Zoster Vaccines- Shingrix  Completed   HPV VACCINES  Aged Out    Health Maintenance  Health Maintenance Due  Topic Date Due   DEXA SCAN  08/16/2019   DTaP/Tdap/Td (2 - Td or Tdap) 08/09/2022   MAMMOGRAM  10/26/2022   COVID-19 Vaccine (6 - 2023-24 season) 06/10/2023   Medicare Annual Wellness (AWV)  08/18/2023    Colorectal cancer screening: Type of screening: Colonoscopy. Completed 05/2022. Repeat every 7 years  Mammogram status: Completed 10/2021. Repeat every year order has been placed  Bone Density status: Completed 01/2006. Results reflect: Bone density results: OSTEOPENIA. Repeat every 2 years. Scheduled 08/29/23  Lung Cancer Screening: (Low Dose CT Chest recommended if Age 109-80 years, 20 pack-year currently smoking OR have quit w/in 15years.) does not qualify.     Additional Screening:  Hepatitis C Screening: does qualify; Completed 12/2015  Vision Screening: Recommended annual ophthalmology exams for early detection of glaucoma and other disorders of the eye. Is the patient up to date with their annual eye exam?  Yes  Who is the provider or what is the name of the office in which the patient attends annual eye exams? [facility] If pt is not established with a provider, would they like to  be referred to a provider to establish care? No .   Dental Screening: Recommended annual dental exams for proper oral hygiene    Community Resource Referral / Chronic Care Management: CRR required this visit?  No   CCM required this visit?  No     Plan:     I have personally reviewed and noted the following in the patient's chart:   Medical and social history Use of alcohol, tobacco or illicit drugs  Current medications and supplements including opioid prescriptions. Patient is currently taking opioid prescriptions. Information provided  to patient regarding non-opioid alternatives. Patient advised to discuss non-opioid treatment plan with their provider. Functional ability and status Nutritional status Physical activity Advanced directives List of other physicians Hospitalizations, surgeries, and ER visits in previous 12 months Vitals Screenings to include cognitive, depression, and falls Referrals and appointments  In addition, I have reviewed and discussed with patient certain preventive protocols, quality metrics, and best practice recommendations. A written personalized care plan for preventive services as well as general preventive health recommendations were provided to patient.     Sydell Axon, LPN   [id number]   After Visit Summary: (MyChart) Due to this being a telephonic visit, the after visit summary with patients personalized plan was offered to patient via MyChart   Nurse Notes: None

## 2023-08-20 NOTE — Patient Instructions (Signed)
Ms. Obriant , Thank you for taking time to come for your Medicare Wellness Visit. I appreciate your ongoing commitment to your health goals. Please review the following plan we discussed and let me know if I can assist you in the future.   Referrals/Orders/Follow-Ups/Clinician Recommendations: Remember to update your Tetanus  This is a list of the screening recommended for you and due dates:  Health Maintenance  Topic Date Due   DEXA scan (bone density measurement)  08/16/2019   DTaP/Tdap/Td vaccine (2 - Td or Tdap) 08/09/2022   Mammogram  10/26/2022   COVID-19 Vaccine (6 - 2023-24 season) 06/10/2023   Medicare Annual Wellness Visit  08/19/2024   Colon Cancer Screening  05/25/2029   Pneumonia Vaccine  Completed   Flu Shot  Completed   Hepatitis C Screening  Completed   Zoster (Shingles) Vaccine  Completed   HPV Vaccine  Aged Out    Advanced directives: (In Chart) A copy of your advanced directives are scanned into your chart should your provider ever need it.  Next Medicare Annual Wellness Visit scheduled for next year: Yes 08/22/24 @ 10:50   Managing Pain Without Opioids Opioids are strong medicines used to treat moderate to severe pain. For some people, especially those who have long-term (chronic) pain, opioids may not be the best choice for pain management due to: Side effects like nausea, constipation, and sleepiness. The risk of addiction (opioid use disorder). The longer you take opioids, the greater your risk of addiction. Pain that lasts for more than 3 months is called chronic pain. Managing chronic pain usually requires more than one approach and is often provided by a team of health care providers working together (multidisciplinary approach). Pain management may be done at a pain management center or pain clinic. How to manage pain without the use of opioids Use non-opioid medicines Non-opioid medicines for pain may include: Over-the-counter or prescription non-steroidal  anti-inflammatory drugs (NSAIDs). These may be the first medicines used for pain. They work well for muscle and bone pain, and they reduce swelling. Acetaminophen. This over-the-counter medicine may work well for milder pain but not swelling. Antidepressants. These may be used to treat chronic pain. A certain type of antidepressant (tricyclics) is often used. These medicines are given in lower doses for pain than when used for depression. Anticonvulsants. These are usually used to treat seizures but may also reduce nerve (neuropathic) pain. Muscle relaxants. These relieve pain caused by sudden muscle tightening (spasms). You may also use a pain medicine that is applied to the skin as a patch, cream, or gel (topical analgesic), such as a numbing medicine. These may cause fewer side effects than medicines taken by mouth. Do certain therapies as directed Some therapies can help with pain management. They include: Physical therapy. You will do exercises to gain strength and flexibility. A physical therapist may teach you exercises to move and stretch parts of your body that are weak, stiff, or painful. You can learn these exercises at physical therapy visits and practice them at home. Physical therapy may also involve: Massage. Heat wraps or applying heat or cold to affected areas. Electrical signals that interrupt pain signals (transcutaneous electrical nerve stimulation, TENS). Weak lasers that reduce pain and swelling (low-level laser therapy). Signals from your body that help you learn to regulate pain (biofeedback). Occupational therapy. This helps you to learn ways to function at home and work with less pain. Recreational therapy. This involves trying new activities or hobbies, such as a physical activity  or drawing. Mental health therapy, including: Cognitive behavioral therapy (CBT). This helps you learn coping skills for dealing with pain. Acceptance and commitment therapy (ACT) to change the  way you think and react to pain. Relaxation therapies, including muscle relaxation exercises and mindfulness-based stress reduction. Pain management counseling. This may be individual, family, or group counseling.  Receive medical treatments Medical treatments for pain management include: Nerve block injections. These may include a pain blocker and anti-inflammatory medicines. You may have injections: Near the spine to relieve chronic back or neck pain. Into joints to relieve back or joint pain. Into nerve areas that supply a painful area to relieve body pain. Into muscles (trigger point injections) to relieve some painful muscle conditions. A medical device placed near your spine to help block pain signals and relieve nerve pain or chronic back pain (spinal cord stimulation device). Acupuncture. Follow these instructions at home Medicines Take over-the-counter and prescription medicines only as told by your health care provider. If you are taking pain medicine, ask your health care providers about possible side effects to watch out for. Do not drive or use heavy machinery while taking prescription opioid pain medicine. Lifestyle  Do not use drugs or alcohol to reduce pain. If you drink alcohol, limit how much you have to: 0-1 drink a day for women who are not pregnant. 0-2 drinks a day for men. Know how much alcohol is in a drink. In the [place] one drink equals one 12 oz bottle of beer (355 mL), one 5 oz glass of wine (148 mL), or one 1 oz glass of hard liquor (44 mL). Do not use any products that contain nicotine or tobacco. These products include cigarettes, chewing tobacco, and vaping devices, such as e-cigarettes. If you need help quitting, ask your health care provider. Eat a healthy diet and maintain a healthy weight. Poor diet and excess weight may make pain worse. Eat foods that are high in fiber. These include fresh fruits and vegetables, whole grains, and beans. Limit foods that  are high in fat and processed sugars, such as fried and sweet foods. Exercise regularly. Exercise lowers stress and may help relieve pain. Ask your health care provider what activities and exercises are safe for you. If your health care provider approves, join an exercise class that combines movement and stress reduction. Examples include yoga and tai chi. Get enough sleep. Lack of sleep may make pain worse. Lower stress as much as possible. Practice stress reduction techniques as told by your therapist. General instructions Work with all your pain management providers to find the treatments that work best for you. You are an important member of your pain management team. There are many things you can do to reduce pain on your own. Consider joining an online or in-person support group for people who have chronic pain. Keep all follow-up visits. This is important. Where to find more information You can find more information about managing pain without opioids from: American Academy of Pain Medicine: painmed.org Institute for Chronic Pain: instituteforchronicpain.org American Chronic Pain Association: theacpa.org Contact a health care provider if: You have side effects from pain medicine. Your pain gets worse or does not get better with treatments or home therapy. You are struggling with anxiety or depression. Summary Many types of pain can be managed without opioids. Chronic pain may respond better to pain management without opioids. Pain is best managed when you and a team of health care providers work together. Pain management without opioids may include  non-opioid medicines, medical treatments, physical therapy, mental health therapy, and lifestyle changes. Tell your health care providers if your pain gets worse or is not being managed well enough. This information is not intended to replace advice given to you by your health care provider. Make sure you discuss any questions you have with  your health care provider. Document Revised: 01/05/2021 Document Reviewed: 01/05/2021 Elsevier Patient Education  2024 [employer].

## 2023-08-21 ENCOUNTER — Ambulatory Visit: Payer: Medicare HMO | Admitting: Physical Therapy

## 2023-08-23 ENCOUNTER — Ambulatory Visit: Payer: Medicare HMO | Admitting: Physical Therapy

## 2023-08-23 ENCOUNTER — Telehealth: Payer: Self-pay | Admitting: Psychiatry

## 2023-08-23 DIAGNOSIS — R262 Difficulty in walking, not elsewhere classified: Secondary | ICD-10-CM

## 2023-08-23 DIAGNOSIS — M5459 Other low back pain: Secondary | ICD-10-CM

## 2023-08-23 DIAGNOSIS — G8929 Other chronic pain: Secondary | ICD-10-CM

## 2023-08-23 DIAGNOSIS — F3341 Major depressive disorder, recurrent, in partial remission: Secondary | ICD-10-CM

## 2023-08-23 MED ORDER — DULOXETINE HCL 60 MG PO CPEP: 120.0000 mg | ORAL_CAPSULE | Freq: Every day | ORAL | 0 refills | Status: DC

## 2023-08-23 NOTE — Therapy (Signed)
OUTPATIENT PHYSICAL THERAPY TREATMENT    Patient Name: Madeline Young MRN: [id number] DOB:[birth year], 67 y.o., female Today's Date: 08/23/2023  END OF SESSION:  PT End of Session - 08/23/23 1120     Visit Number 15    Number of Visits 20    Date for PT Re-Evaluation 10/22/23    Authorization Type [employer] 2024 reporting period from  07/30/2023    Progress Note Due on Visit 10    PT Start Time 1120    PT Stop Time 1158    PT Time Calculation (min) 38 min    Activity Tolerance Patient tolerated treatment well;No increased pain    Behavior During Therapy [facility] for tasks assessed/performed               Past Medical History:  Diagnosis Date   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   ADHD (attention deficit hyperactivity disorder)    Allergy    Anxiety    Arthritis    Cataract    bil cateracts removed   Complication of anesthesia    first colonoscopy pt states she woke up   Depression    GERD (gastroesophageal reflux disease)    Globus sensation    Hyperlipidemia    Incontinence    bowels at times   Insomnia    Multinodular goiter (nontoxic)    Psoriatic arthritis (HCC)    Spinal stenosis of lumbar region 03/10/2015   MRI    Statin intolerance 01/27/2013   Past Surgical History:  Procedure Laterality Date   BACK SURGERY  02/17/2016   Dr. Leotis Shames- L4-L5 fusion    BIOPSY THYROID  2014   COLONOSCOPY     COLONOSCOPY W/ POLYPECTOMY     EYE SURGERY     bilateral cataract surgery w/ lens implant   FOOT SURGERY  2006   Right foot , secondary to severe loss of joint [facility])   POLYPECTOMY     TONSILLECTOMY     UPPER GASTROINTESTINAL ENDOSCOPY  2005   With empiric esophageal dilation   Patient Active Problem List   Diagnosis Date Noted   Unintentional Tylenol overdose, sequela 08/14/2023   Chronic pain not due to malignancy 08/14/2023   Slac (scapholunate advanced collapse) of wrist, left 06/03/2023   Mass of abdomen 05/23/2023   Urge incontinence  05/16/2022   Onychomycosis 08/17/2021   Drug allergy 06/29/2021   Fibromyalgia 06/29/2021   Generalized osteoarthritis 06/29/2021   Hand joint pain 06/29/2021   Other intervertebral disc degeneration, lumbar region 06/29/2021   Other long term (current) drug therapy 06/29/2021   Sciatica 01/28/2020   Rhinitis, non-allergic 11/10/2019   Insomnia 08/10/2019   Anxiety 10/11/2018   Carotid artery stenosis, asymptomatic, bilateral 10/29/2016   ADD (attention deficit disorder) 10/29/2016   Spondylolisthesis of lumbar region 02/17/2016   Globus sensation since at least 2005 04/29/2015   GERD (gastroesophageal reflux disease) 11/17/2014   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   Travel advice encounter 01/27/2013   Multinodular goiter (nontoxic) 08/11/2012   Overweight (BMI 25.0-29.9) 08/11/2012   Psoriatic arthritis, destructive type (HCC)    Screening for cervical cancer 08/21/2011   Screening for breast cancer 08/21/2011   Hx of adenomatous polyp of colon 10/06/2008   Familial hyperlipidemia 09/15/2008   Major depressive disorder, single episode, in remission (HCC) 09/15/2008   ALLERGIC RHINITIS 09/15/2008    PCP: Dr. Duncan Dull   REFERRING PROVIDER: Duke Salvia NP   REFERRING DIAG: Spinal stenosis of lumbar region  Rationale for Evaluation and Treatment:  Rehabilitation  THERAPY DIAG:  Other low back pain  Difficulty in walking, not elsewhere classified  ONSET DATE: 2017   PERTINENT HISTORY:  Pt reports that she has three different types of arthritis: osteo, rheumatoid, and psoriatic. She had lumbar fusion in 2017 which she felt did not help significantly. She had an infusion for her psoriatic arthritis this past spring and it caused her low back pain. She recently had a spinal injection and this resolved radiating pain on her LLE, but she still has excruciating, radiating pain down her right lower extremity. She feels unsteady most of the time and she has to use walker to keep from  falling. Pt has also been told that she needs a reverse total shoulder because right shoulder RTC damage.   SUBJECTIVE:                                                                                                                                                                                           SUBJECTIVE STATEMENT: Patient states she has not been good since last PT session. She cannot figure out why. She has a huge bruise at the left lateral hip and wonders if the bruise threw her into a flair up because she states it can do that (she has not experienced it but she has learned about that). She states she has been having a lot more pain and it is discouraging. She wonders if she has been overdoing it. She is frustrated because she has been working so hard. She did her HEP and   Loves being outside, walking.   PAIN:  Are you having pain? Yes  NPRS: 6/10 left lateral thigh and middle low back, mild achiness in the right lower abdominal quarter that gets a little worse right after she eats.   PRECAUTIONS: None  PATIENT GOALS: To improve balance and to decrease   OBJECTIVE   TODAY'S TREATMENT:                                                         Neuromuscular Re-education: to improve, balance, postural strength, muscle activation patterns, and stabilization strength required for functional activities: - seated (on clear tharaball) single arm row, 2x12 each side with 15#cable. Cuing for improved posture.  - seated (on clear theraball), march with hold, 1x10 each side with 5 second hold and pauses for cuing to help patient improve balance and posture. - seated (on clear theraball), trunk rotations  holding 2kg med ball, 2x10 each side - seated bouncing with good posture on clear theraball, 2x1 min.  - lateral stepping 7x4 feet each direction on airex beam with CGA and intermittant UE support  Therapeutic activities: for functional strengthening and improved functional activity  tolerance.  - squat to buttocks tap on 17 inch chair with large half spike ball on it while holding 2kg med ball in front of body, 3x10 with 1 min break between sets.   Pt required multimodal cuing for proper technique and to facilitate improved neuromuscular control, strength, range of motion, and functional ability resulting in improved performance and form.  PATIENT EDUCATION:  Education details: form and technique for correct performance of exercise. How to use SPC correctly and safely  Person educated: Patient Education method: Explanation, Demonstration, Verbal cues, and Handouts Education comprehension: verbalized understanding, returned demonstration, and verbal cues required  HOME EXERCISE PROGRAM: Access Code: [email or web address] URL: [email or web address] Date: 08/16/2023 Prepared by: Norton Blizzard  Exercises - Sit to Stand  - 3-4 x weekly - 2 sets - 7-10 reps - Walking with Head Nod  - 3-4 x weekly - 3 sets - 10 reps - Standing March with Counter Support  - 3-4 x weekly - 3 sets - 10 reps - Standing Toe Taps  - 3-4 x weekly - 3 sets - 10 reps - Seated Single Arm Shoulder Row with Anchored Resistance  - 3-7 x weekly - 2-3 sets - 15 reps - 2 seconds hold  HOME EXERCISE PROGRAM [[id number]] View at [email or web address]" using code: [email or web address] Supine Marching (phase 2) - 'up up, down down' -  Repeat 10 Repetitions, Complete 2 Sets, Perform 2 Times a Day  SCIATIC NERVE GLIDE -  Repeat 15 Repetitions, Complete 1 Set, Perform 2    ASSESSMENT:  CLINICAL IMPRESSION: Patient arrives with [facility] with worsening of pain since last PT session. Today's session continued to work on core and functional strengthening exercises at tolerated to support improved pain control and functional ability. She reported pain in anterior thighs that correlates to muscle fatigue during squat exercise. Patient would benefit from continued management of limiting condition by skilled physical therapist to  address remaining impairments and functional limitations to work towards stated goals and return to PLOF or maximal functional independence.   OBJECTIVE IMPAIRMENTS: Abnormal gait, decreased balance, decreased endurance, decreased knowledge of use of DME, decreased mobility, difficulty walking, decreased ROM, decreased strength, hypomobility, impaired flexibility, impaired sensation, impaired UE functional use, and pain.   ACTIVITY LIMITATIONS: carrying, lifting, bending, standing, squatting, sleeping, stairs, bed mobility, bathing, toileting, dressing, reach over head, hygiene/grooming, and locomotion level  PARTICIPATION LIMITATIONS: cleaning, laundry, driving, shopping, community activity, and yard work  PERSONAL FACTORS: Past/current experiences, Time since onset of injury/illness/exacerbation, and 3+ comorbidities: psoriatic and rheumatoid arthritis,   are also affecting patient's functional outcome.   REHAB POTENTIAL: Fair chronicity of low back and multiple co-morbidities  CLINICAL DECISION MAKING: Evolving/moderate complexity  EVALUATION COMPLEXITY: Moderate   GOALS: Goals reviewed with patient? No  SHORT TERM GOALS: Target date: 06/05/2023  Pt will be independent with HEP in order to improve strength and balance in order to decrease fall risk and improve function at home and work. Baseline: NT 06/05/23: Able to perform independently  Goal status: Achieved   2.  Pt will increase by at least 0.13 m/s in order to demonstrate clinically significant improvement in community ambulation.  Baseline:  0.81 m/sec; 0.82 m/s (07/30/2023);  Goal status: ONGOING   3.  Pt  will increase by at least 36m (141ft) in order to demonstrate clinically significant improvement in cardiopulmonary endurance and community ambulation Baseline:  800 ft; 805 feet with SPC (07/30/2023);  Goal status: Ongoing   4.  Patient will show correct use of single point cane and rollator for improved  ambulation and to decrease risk of falling.  Baseline: NT 06/04/23: Using SPC  Goal status: ACHIEVED    LONG TERM GOALS: Target date: 07/31/2023. Target date updated to 10/22/2023 for all unmet goals on 07/30/2023.   Patient will have improved function and activity level as evidenced by an increase in FOTO score by 10 points or more.  Baseline: 40/100 with target of 47: 39 at visit #10 (07/30/2023);  Goal status: ONGOING   2.  Patient will ambulate >=1000 ft for as evidence of improved aerobic endurance and lumbar function.  Baseline: 800 ft; 805 feet with SPC (07/30/2023);  Goal status: ONGOING   3. Patient will perform 10 meter walk test in >=1 m/sec for improved mobility and to decrease her risk of falling. Baseline: 0.81 m/sec; 0.82 m/s (07/30/2023);  Goal status: ONGOING   4.  Patient will demonstrate an improvement in DGI score of >=2 pts as evidence of improved dynamic balance to decrease risk of falling (Pardasaney et al, 2012). Baseline: 14/24; 15/24 (07/30/2023);  Goal status: ONGOING   PLAN:  PT FREQUENCY: 1-2x/week  PT DURATION: 12 weeks  PLANNED INTERVENTIONS: Therapeutic exercises, Therapeutic activity, Neuromuscular re-education, Balance training, Gait training, Patient/Family education, Self Care, Joint mobilization, Joint manipulation, Stair training, Vestibular training, Canalith repositioning, DME instructions, Aquatic Therapy, Dry Needling, Electrical stimulation, Spinal manipulation, Spinal mobilization, Cryotherapy, Moist heat, Traction, Ultrasound, Manual therapy, and Re-evaluation.  PLAN FOR NEXT SESSION: update HEP as appropriate, progressive LE/trunk/functional strengthening, motor control, and dynamic balance exercises.    Luretha Murphy. Ilsa Iha, PT, DPT, Cert. MDT 08/23/23, 5:11 PM  [facility] [address] [place], [place] [address] P: [phone] I F: [phone]

## 2023-08-23 NOTE — Telephone Encounter (Signed)
Sent!

## 2023-08-23 NOTE — Telephone Encounter (Signed)
Patient called in for new prescription for Cymbalta 60mg . States she started off taking one a day and now is taking a two a day. She is about to run out do to this and needs prescription sent for two a day for a three month supply. Ph: [phone] Appt 12/5 Pharmacy [facility] [phone] [place], [place]

## 2023-08-28 ENCOUNTER — Ambulatory Visit: Payer: Medicare HMO | Admitting: Physical Therapy

## 2023-08-29 DIAGNOSIS — M85852 Other specified disorders of bone density and structure, left thigh: Secondary | ICD-10-CM | POA: Diagnosis not present

## 2023-08-29 DIAGNOSIS — Z13820 Encounter for screening for osteoporosis: Secondary | ICD-10-CM | POA: Diagnosis not present

## 2023-08-29 DIAGNOSIS — Z7952 Long term (current) use of systemic steroids: Secondary | ICD-10-CM | POA: Diagnosis not present

## 2023-08-29 LAB — HM DEXA SCAN

## 2023-08-30 ENCOUNTER — Encounter: Payer: Self-pay | Admitting: Physical Therapy

## 2023-08-30 ENCOUNTER — Ambulatory Visit: Payer: Medicare HMO | Admitting: Physical Therapy

## 2023-08-30 DIAGNOSIS — M5459 Other low back pain: Secondary | ICD-10-CM | POA: Diagnosis not present

## 2023-08-30 DIAGNOSIS — R262 Difficulty in walking, not elsewhere classified: Secondary | ICD-10-CM

## 2023-08-30 NOTE — Therapy (Addendum)
OUTPATIENT PHYSICAL THERAPY TREATMENT    Patient Name: Madeline Young MRN: [id number] DOB:05/25/1956, 67 y.o., female Today's Date: 08/30/2023  END OF SESSION:  PT End of Session - 08/30/23 1201     Visit Number 16    Number of Visits 20    Date for PT Re-Evaluation 10/22/23    Authorization Type [employer] 2024 reporting period from  07/30/2023    Progress Note Due on Visit 10    PT Start Time 1120    PT Stop Time 1205    PT Time Calculation (min) 45 min    Activity Tolerance Patient tolerated treatment well;No increased pain    Behavior During Therapy [facility] for tasks assessed/performed                Past Medical History:  Diagnosis Date   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   ADHD (attention deficit hyperactivity disorder)    Allergy    Anxiety    Arthritis    Cataract    bil cateracts removed   Complication of anesthesia    first colonoscopy pt states she woke up   Depression    GERD (gastroesophageal reflux disease)    Globus sensation    Hyperlipidemia    Incontinence    bowels at times   Insomnia    Multinodular goiter (nontoxic)    Psoriatic arthritis (HCC)    Spinal stenosis of lumbar region 03/10/2015   MRI    Statin intolerance 01/27/2013   Past Surgical History:  Procedure Laterality Date   BACK SURGERY  02/17/2016   Dr. Leotis Shames- L4-L5 fusion    BIOPSY THYROID  2014   COLONOSCOPY     COLONOSCOPY W/ POLYPECTOMY     EYE SURGERY     bilateral cataract surgery w/ lens implant   FOOT SURGERY  2006   Right foot , secondary to severe loss of joint [facility])   POLYPECTOMY     TONSILLECTOMY     UPPER GASTROINTESTINAL ENDOSCOPY  2005   With empiric esophageal dilation   Patient Active Problem List   Diagnosis Date Noted   Unintentional Tylenol overdose, sequela 08/14/2023   Chronic pain not due to malignancy 08/14/2023   Slac (scapholunate advanced collapse) of wrist, left 06/03/2023   Mass of abdomen 05/23/2023   Urge incontinence  05/16/2022   Onychomycosis 08/17/2021   Drug allergy 06/29/2021   Fibromyalgia 06/29/2021   Generalized osteoarthritis 06/29/2021   Hand joint pain 06/29/2021   Other intervertebral disc degeneration, lumbar region 06/29/2021   Other long term (current) drug therapy 06/29/2021   Sciatica 01/28/2020   Rhinitis, non-allergic 11/10/2019   Insomnia 08/10/2019   Anxiety 10/11/2018   Carotid artery stenosis, asymptomatic, bilateral 10/29/2016   ADD (attention deficit disorder) 10/29/2016   Spondylolisthesis of lumbar region 02/17/2016   Globus sensation since at least 2005 04/29/2015   GERD (gastroesophageal reflux disease) 11/17/2014   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   Travel advice encounter 01/27/2013   Multinodular goiter (nontoxic) 08/11/2012   Overweight (BMI 25.0-29.9) 08/11/2012   Psoriatic arthritis, destructive type (HCC)    Screening for cervical cancer 08/21/2011   Screening for breast cancer 08/21/2011   Hx of adenomatous polyp of colon 10/06/2008   Familial hyperlipidemia 09/15/2008   Major depressive disorder, single episode, in remission (HCC) 09/15/2008   ALLERGIC RHINITIS 09/15/2008    PCP: Dr. Duncan Dull   REFERRING PROVIDER: Duke Salvia NP   REFERRING DIAG: Spinal stenosis of lumbar region  Rationale for Evaluation and  Treatment: Rehabilitation  THERAPY DIAG:  Other low back pain  Difficulty in walking, not elsewhere classified  ONSET DATE: 2017   PERTINENT HISTORY:  Pt reports that she has three different types of arthritis: osteo, rheumatoid, and psoriatic. She had lumbar fusion in 2017 which she felt did not help significantly. She had an infusion for her psoriatic arthritis this past spring and it caused her low back pain. She recently had a spinal injection and this resolved radiating pain on her LLE, but she still has excruciating, radiating pain down her right lower extremity. She feels unsteady most of the time and she has to use walker to keep from  falling. Pt has also been told that she needs a reverse total shoulder because right shoulder RTC damage.   SUBJECTIVE:                                                                                                                                                                                           SUBJECTIVE STATEMENT: Patient felt pretty good since last PT session.  Did HEP twice since last visit, and did a lot of walking. One of the days was too much walking and hurt after.  PAIN:  Are you having pain? Yes  NPRS: 6/10 in the front of the left hip. Now pain is at a 4/10 at the end  PRECAUTIONS: None  PATIENT GOALS: To improve balance and to decrease   OBJECTIVE   TODAY'S TREATMENT:                                                         Neuromuscular Re-education: to improve, balance, postural strength, muscle activation patterns, and stabilization strength required for functional activities:  - seated (on clear tharaball) single arm row 20 # cable, 2x10  CGA-min assist  - seated (on clear theraball), march with hold x5 each side with 5 second hold  CGA-min assist  Seated on clear theraball SA row with ipsi foot off floor x10/side with 5 lbs, 2x10/side with 10 lbs  CGA-min assist  Blazepod taps with 4 blazepods 3x1 minute taps. Rest was self-selected.  4 blazepods were used.  The one in front was on a step requiring hip flexion. In rounds 2 and 3, the blazepods were moved closer to her. CGA-min assist Set 1: 18 taps Set 2: 35 taps Set 3: 36 taps   Therapeutic exercise: to centralize symptoms and improve ROM, strength, muscular endurance,  and activity tolerance required for successful completion of functional activities.    Single-leg press 2x20/side with 35 lbs (with 45 lbs, she could not do any. On the last set her seat was adjusted so she was closer to the machine and required more hip ROM)    Pt required multimodal cuing for proper technique and to facilitate improved  neuromuscular control, strength, range of motion, and functional ability resulting in improved performance and form.  PATIENT EDUCATION:  Education details: form and technique for correct performance of exercise.  Person educated: Patient Education method: Explanation, Demonstration, Verbal cues, and Handouts Education comprehension: verbalized understanding, returned demonstration, and verbal cues required  HOME EXERCISE PROGRAM: Access Code: [email or web address] URL: [email or web address] Date: 08/16/2023 Prepared by: Norton Blizzard  Exercises - Sit to Stand  - 3-4 x weekly - 2 sets - 7-10 reps - Walking with Head Nod  - 3-4 x weekly - 3 sets - 10 reps - Standing March with Counter Support  - 3-4 x weekly - 3 sets - 10 reps - Standing Toe Taps  - 3-4 x weekly - 3 sets - 10 reps - Seated Single Arm Shoulder Row with Anchored Resistance  - 3-7 x weekly - 2-3 sets - 15 reps - 2 seconds hold  HOME EXERCISE PROGRAM [[id number]] View at [email or web address]" using code: [id number] Supine Marching (phase 2) - 'up up, down down' -  Repeat 10 Repetitions, Complete 2 Sets, Perform 2 Times a Day  SCIATIC NERVE GLIDE -  Repeat 15 Repetitions, Complete 1 Set, Perform 2    ASSESSMENT:  CLINICAL IMPRESSION: Patient arrives with [facility] and feeling "pretty good" this past week.  She mentioned overdoing things with walking but seemed eager to work today.  The focus today was increasing the load on some of her strength-focused exercises, such as the row on the theraball. She was able tolerate increased weight without needing to decrease reps and with keeping proper technique.  The single-leg leg press was also added to further emphasize single leg strength without excessive demands on balance, which she also tolerated well.  She still had challenges with feeing "unsteady" with standing balance exercises.  Patient would benefit from continued management of limiting condition by skilled physical therapist to  address remaining impairments and functional limitations to work towards stated goals and return to PLOF or maximal functional independence.    OBJECTIVE IMPAIRMENTS: Abnormal gait, decreased balance, decreased endurance, decreased knowledge of use of DME, decreased mobility, difficulty walking, decreased ROM, decreased strength, hypomobility, impaired flexibility, impaired sensation, impaired UE functional use, and pain.   ACTIVITY LIMITATIONS: carrying, lifting, bending, standing, squatting, sleeping, stairs, bed mobility, bathing, toileting, dressing, reach over head, hygiene/grooming, and locomotion level  PARTICIPATION LIMITATIONS: cleaning, laundry, driving, shopping, community activity, and yard work  PERSONAL FACTORS: Past/current experiences, Time since onset of injury/illness/exacerbation, and 3+ comorbidities: psoriatic and rheumatoid arthritis,   are also affecting patient's functional outcome.   REHAB POTENTIAL: Fair chronicity of low back and multiple co-morbidities  CLINICAL DECISION MAKING: Evolving/moderate complexity  EVALUATION COMPLEXITY: Moderate   GOALS: Goals reviewed with patient? No  SHORT TERM GOALS: Target date: 06/05/2023  Pt will be independent with HEP in order to improve strength and balance in order to decrease fall risk and improve function at home and work. Baseline: NT 06/05/23: Able to perform independently  Goal status: Achieved   2.  Pt will increase by at least 0.13 m/s in order to demonstrate clinically significant improvement in community ambulation.  Baseline:  0.81 m/sec; 0.82 m/s (07/30/2023);  Goal status: ONGOING   3.  Pt will increase by at least 48m (1104ft) in order to demonstrate clinically significant improvement in cardiopulmonary endurance and community ambulation Baseline:  800 ft; 805 feet with SPC (07/30/2023);  Goal status: Ongoing   4.  Patient will show correct use of single point cane and rollator for improved  ambulation and to decrease risk of falling.  Baseline: NT 06/04/23: Using SPC  Goal status: ACHIEVED    LONG TERM GOALS: Target date: 07/31/2023. Target date updated to 10/22/2023 for all unmet goals on 07/30/2023.   Patient will have improved function and activity level as evidenced by an increase in FOTO score by 10 points or more.  Baseline: 40/100 with target of 47: 39 at visit #10 (07/30/2023);  Goal status: ONGOING   2.  Patient will ambulate >=1000 ft for as evidence of improved aerobic endurance and lumbar function.  Baseline: 800 ft; 805 feet with SPC (07/30/2023);  Goal status: ONGOING   3. Patient will perform 10 meter walk test in >=1 m/sec for improved mobility and to decrease her risk of falling. Baseline: 0.81 m/sec; 0.82 m/s (07/30/2023);  Goal status: ONGOING   4.  Patient will demonstrate an improvement in DGI score of >=2 pts as evidence of improved dynamic balance to decrease risk of falling (Pardasaney et al, 2012). Baseline: 14/24; 15/24 (07/30/2023);  Goal status: ONGOING   PLAN:  PT FREQUENCY: 1-2x/week  PT DURATION: 12 weeks  PLANNED INTERVENTIONS: Therapeutic exercises, Therapeutic activity, Neuromuscular re-education, Balance training, Gait training, Patient/Family education, Self Care, Joint mobilization, Joint manipulation, Stair training, Vestibular training, Canalith repositioning, DME instructions, Aquatic Therapy, Dry Needling, Electrical stimulation, Spinal manipulation, Spinal mobilization, Cryotherapy, Moist heat, Traction, Ultrasound, Manual therapy, and Re-evaluation.  PLAN FOR NEXT SESSION: update HEP as appropriate, progressive LE/trunk/functional strengthening, motor control, and dynamic balance exercises.   Neddie Steedman, [place], Student-PT    [place]. Ilsa Iha, PT, DPT, Cert. MDT 08/30/23, 7:52 PM  [facility] [address] [place], [place] [address] P: [phone] I F: [phone]

## 2023-09-03 ENCOUNTER — Telehealth: Payer: Self-pay | Admitting: Internal Medicine

## 2023-09-03 MED ORDER — OXYCODONE-ACETAMINOPHEN 5-325 MG PO TABS: 1.0000 | ORAL_TABLET | Freq: Four times a day (QID) | ORAL | 0 refills | Status: DC | PRN

## 2023-09-03 NOTE — Telephone Encounter (Signed)
Requesting: Oxycodone Contract: Yes UDS: No Last Visit: 08/13/2023 Next Visit: 11/13/2023 Last Refill: 08/06/2023  Please Advise

## 2023-09-03 NOTE — Telephone Encounter (Signed)
LM letting pt know that the medication she requested has been refilled.

## 2023-09-03 NOTE — Telephone Encounter (Signed)
Prescription Request  09/03/2023  LOV: 08/13/2023  What is the name of the medication or equipment? oxycodone  Have you contacted your pharmacy to request a refill? No   Which pharmacy would you like this sent to? [facility] [place]   Patient notified that their request is being sent to the clinical staff for review and that they should receive a response within 2 business days.   Please advise at Mobile [phone] (mobile)

## 2023-09-04 ENCOUNTER — Ambulatory Visit: Payer: Medicare HMO | Admitting: Physical Therapy

## 2023-09-04 ENCOUNTER — Encounter: Payer: Self-pay | Admitting: Physical Therapy

## 2023-09-04 DIAGNOSIS — R262 Difficulty in walking, not elsewhere classified: Secondary | ICD-10-CM | POA: Diagnosis not present

## 2023-09-04 DIAGNOSIS — M5459 Other low back pain: Secondary | ICD-10-CM | POA: Diagnosis not present

## 2023-09-04 NOTE — Therapy (Addendum)
OUTPATIENT PHYSICAL THERAPY TREATMENT    Patient Name: Madeline Young MRN: [id number] DOB:Jul 25, 1956, 67 y.o., female Today's Date: 09/04/2023  END OF SESSION:  PT End of Session - 09/04/23 1318     Visit Number 17    Number of Visits 20    Date for PT Re-Evaluation 10/22/23    Authorization Type [employer] 2024 reporting period from  07/30/2023    Progress Note Due on Visit 10    PT Start Time 1123    PT Stop Time 1210    PT Time Calculation (min) 47 min    Equipment Utilized During Treatment Gait belt    Activity Tolerance Patient tolerated treatment well;Patient limited by pain    Behavior During Therapy WFL for tasks assessed/performed                 Past Medical History:  Diagnosis Date   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   ADHD (attention deficit hyperactivity disorder)    Allergy    Anxiety    Arthritis    Cataract    bil cateracts removed   Complication of anesthesia    first colonoscopy pt states she woke up   Depression    GERD (gastroesophageal reflux disease)    Globus sensation    Hyperlipidemia    Incontinence    bowels at times   Insomnia    Multinodular goiter (nontoxic)    Psoriatic arthritis (HCC)    Spinal stenosis of lumbar region 03/10/2015   MRI    Statin intolerance 01/27/2013   Past Surgical History:  Procedure Laterality Date   BACK SURGERY  02/17/2016   Dr. Leotis Shames- L4-L5 fusion    BIOPSY THYROID  2014   COLONOSCOPY     COLONOSCOPY W/ POLYPECTOMY     EYE SURGERY     bilateral cataract surgery w/ lens implant   FOOT SURGERY  2006   Right foot , secondary to severe loss of joint [facility])   POLYPECTOMY     TONSILLECTOMY     UPPER GASTROINTESTINAL ENDOSCOPY  2005   With empiric esophageal dilation   Patient Active Problem List   Diagnosis Date Noted   Unintentional Tylenol overdose, sequela 08/14/2023   Chronic pain not due to malignancy 08/14/2023   Slac (scapholunate advanced collapse) of wrist, left  06/03/2023   Mass of abdomen 05/23/2023   Urge incontinence 05/16/2022   Onychomycosis 08/17/2021   Drug allergy 06/29/2021   Fibromyalgia 06/29/2021   Generalized osteoarthritis 06/29/2021   Hand joint pain 06/29/2021   Other intervertebral disc degeneration, lumbar region 06/29/2021   Other long term (current) drug therapy 06/29/2021   Sciatica 01/28/2020   Rhinitis, non-allergic 11/10/2019   Insomnia 08/10/2019   Anxiety 10/11/2018   Carotid artery stenosis, asymptomatic, bilateral 10/29/2016   ADD (attention deficit disorder) 10/29/2016   Spondylolisthesis of lumbar region 02/17/2016   Globus sensation since at least 2005 04/29/2015   GERD (gastroesophageal reflux disease) 11/17/2014   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   Travel advice encounter 01/27/2013   Multinodular goiter (nontoxic) 08/11/2012   Overweight (BMI 25.0-29.9) 08/11/2012   Psoriatic arthritis, destructive type (HCC)    Screening for cervical cancer 08/21/2011   Screening for breast cancer 08/21/2011   Hx of adenomatous polyp of colon 10/06/2008   Familial hyperlipidemia 09/15/2008   Major depressive disorder, single episode, in remission (HCC) 09/15/2008   ALLERGIC RHINITIS 09/15/2008    PCP: Dr. Duncan Dull   REFERRING PROVIDER: Duke Salvia NP   REFERRING  DIAG: Spinal stenosis of lumbar region  Rationale for Evaluation and Treatment: Rehabilitation  THERAPY DIAG:  Other low back pain  Difficulty in walking, not elsewhere classified  ONSET DATE: 2017   PERTINENT HISTORY:  Pt reports that she has three different types of arthritis: osteo, rheumatoid, and psoriatic. She had lumbar fusion in 2017 which she felt did not help significantly. She had an infusion for her psoriatic arthritis this past spring and it caused her low back pain. She recently had a spinal injection and this resolved radiating pain on her LLE, but she still has excruciating, radiating pain down her right lower extremity. She feels  unsteady most of the time and she has to use walker to keep from falling. Pt has also been told that she needs a reverse total shoulder because right shoulder RTC damage.   SUBJECTIVE:                                                                                                                                                                                           SUBJECTIVE STATEMENT: Patient felt very good since last PT session.  She went to the grocery store and the electric carts were taken, so she had to push a cart around the store.  This was taxing for her and as a result, she did not do her HEP.  She noted ADLs are a sufficient challenge for her.  She reports now taking prednisone, which helps with her pain but makes it hard for her to sleep.  PAIN:  Are you having pain? Yes  NPRS: 4/10 at the front of right hip (kept her up last night).  PRECAUTIONS: None  PATIENT GOALS: To improve balance and to decrease   OBJECTIVE   TODAY'S TREATMENT:                                                         Neuromuscular Re-education: to improve, balance, postural strength, muscle activation patterns, and stabilization strength required for functional activities:  - seated (on clear tharaball) single arm row 20 # cable, x10  CGA-min assist, x10 each with 15 lbs (lowered weight due to R shoulder pain)  - seated (on clear theraball), march with hold, 1x10 each side with 5 second hold and pauses for cuing to help patient improve balance and posture. x5 each side with 5 second hold  CGA-min assist (easier to lift left foot)  Seated on clear theraball SA  row with ipsi foot off floor 3x10/side with 10 lbs  CGA-min assist (easier to lift right foot)  Blazepod taps with 4 blazepods 3x1 minute taps. Rest was self-selected.  4 blazepods were used.  The one in front was on a step requiring hip flexion. Round 1 the blazepods were on the ground. On rounds 2-3 the blazepods were in the cones -  CGA-min  assist Set 1: 17 taps Set 2: 40 taps Set 3: 39 taps  Therex   Single-leg press 3x12 on each side with 35 lbs (couldn't do 40)     Pt required multimodal cuing for proper technique and to facilitate improved neuromuscular control, strength, range of motion, and functional ability resulting in improved performance and form.  PATIENT EDUCATION:  Education details: form and technique for correct performance of exercise.  Person educated: Patient Education method: Explanation, Demonstration, Verbal cues, and Handouts Education comprehension: verbalized understanding, returned demonstration, and verbal cues required  HOME EXERCISE PROGRAM: Access Code: [email or web address] URL: [email or web address] Date: 08/16/2023 Prepared by: Norton Blizzard  Exercises - Sit to Stand  - 3-4 x weekly - 2 sets - 7-10 reps - Walking with Head Nod  - 3-4 x weekly - 3 sets - 10 reps - Standing March with Counter Support  - 3-4 x weekly - 3 sets - 10 reps - Standing Toe Taps  - 3-4 x weekly - 3 sets - 10 reps - Seated Single Arm Shoulder Row with Anchored Resistance  - 3-7 x weekly - 2-3 sets - 15 reps - 2 seconds hold  HOME EXERCISE PROGRAM [[id number]] View at [email or web address]" using code: [email or web address] Supine Marching (phase 2) - 'up up, down down' -  Repeat 10 Repetitions, Complete 2 Sets, Perform 2 Times a Day  SCIATIC NERVE GLIDE -  Repeat 15 Repetitions, Complete 1 Set, Perform 2    ASSESSMENT:  CLINICAL IMPRESSION: Patient arrives with [facility] and feeling "pretty good" this past week.  She has not been doing her HEP due to having a taxing day at the grocery store pushing a cart.  Today's session focused on progressing her strength and stability exercises.  Her LE strength was slightly lower today than during last session, as she was challenged by completing 12 reps on the leg press with 35 lbs (last time she hit 20 reps).  She also struggled to pull 20 lbs in the theraball row, which she was also able  to do last time (this could be partially due to her right shoulder pain acting up). While it is unclear the exact cause of this strength decrease, it could be related to the lack of sleep she's been experiencing since starting the prednisone.  The plan for next session is to continue to progress her strength and stability exercises. Patient would benefit from continued management of limiting condition by skilled physical therapist to address remaining impairments and functional limitations to work towards stated goals and return to PLOF or maximal functional independence.    OBJECTIVE IMPAIRMENTS: Abnormal gait, decreased balance, decreased endurance, decreased knowledge of use of DME, decreased mobility, difficulty walking, decreased ROM, decreased strength, hypomobility, impaired flexibility, impaired sensation, impaired UE functional use, and pain.   ACTIVITY LIMITATIONS: carrying, lifting, bending, standing, squatting, sleeping, stairs, bed mobility, bathing, toileting, dressing, reach over head, hygiene/grooming, and locomotion level  PARTICIPATION LIMITATIONS: cleaning, laundry, driving, shopping, community activity, and yard work  PERSONAL FACTORS: Past/current experiences, Time since onset of injury/illness/exacerbation, and 3+ comorbidities: psoriatic and rheumatoid arthritis,   are also affecting  patient's functional outcome.   REHAB POTENTIAL: Fair chronicity of low back and multiple co-morbidities  CLINICAL DECISION MAKING: Evolving/moderate complexity  EVALUATION COMPLEXITY: Moderate   GOALS: Goals reviewed with patient? No  SHORT TERM GOALS: Target date: 06/05/2023  Pt will be independent with HEP in order to improve strength and balance in order to decrease fall risk and improve function at home and work. Baseline: NT 06/05/23: Able to perform independently  Goal status: Achieved   2.  Pt will increase by at least 0.13 m/s in order to demonstrate clinically significant  improvement in community ambulation.  Baseline:  0.81 m/sec; 0.82 m/s (07/30/2023);  Goal status: ONGOING   3.  Pt will increase by at least 64m (158ft) in order to demonstrate clinically significant improvement in cardiopulmonary endurance and community ambulation Baseline:  800 ft; 805 feet with SPC (07/30/2023);  Goal status: Ongoing   4.  Patient will show correct use of single point cane and rollator for improved ambulation and to decrease risk of falling.  Baseline: NT 06/04/23: Using SPC  Goal status: ACHIEVED    LONG TERM GOALS: Target date: 07/31/2023. Target date updated to 10/22/2023 for all unmet goals on 07/30/2023.   Patient will have improved function and activity level as evidenced by an increase in FOTO score by 10 points or more.  Baseline: 40/100 with target of 47: 39 at visit #10 (07/30/2023);  Goal status: ONGOING   2.  Patient will ambulate >=1000 ft for as evidence of improved aerobic endurance and lumbar function.  Baseline: 800 ft; 805 feet with SPC (07/30/2023);  Goal status: ONGOING   3. Patient will perform 10 meter walk test in >=1 m/sec for improved mobility and to decrease her risk of falling. Baseline: 0.81 m/sec; 0.82 m/s (07/30/2023);  Goal status: ONGOING   4.  Patient will demonstrate an improvement in DGI score of >=2 pts as evidence of improved dynamic balance to decrease risk of falling (Pardasaney et al, 2012). Baseline: 14/24; 15/24 (07/30/2023);  Goal status: ONGOING   PLAN:  PT FREQUENCY: 1-2x/week  PT DURATION: 12 weeks  PLANNED INTERVENTIONS: Therapeutic exercises, Therapeutic activity, Neuromuscular re-education, Balance training, Gait training, Patient/Family education, Self Care, Joint mobilization, Joint manipulation, Stair training, Vestibular training, Canalith repositioning, DME instructions, Aquatic Therapy, Dry Needling, Electrical stimulation, Spinal manipulation, Spinal mobilization, Cryotherapy, Moist heat,  Traction, Ultrasound, Manual therapy, and Re-evaluation.  PLAN FOR NEXT SESSION: update HEP as appropriate, progressive LE/trunk/functional strengthening, motor control, and dynamic balance exercises.   Francis Doenges, [place], Student-PT    [place]orton. Ilsa Iha, PT, DPT, Cert. MDT 09/04/23, 8:08 PM  [facility] [address] [place], [place] [address] P: [phone] I F: [phone]

## 2023-09-11 DIAGNOSIS — M545 Low back pain, unspecified: Secondary | ICD-10-CM | POA: Diagnosis not present

## 2023-09-11 DIAGNOSIS — G8929 Other chronic pain: Secondary | ICD-10-CM | POA: Diagnosis not present

## 2023-09-11 DIAGNOSIS — M79642 Pain in left hand: Secondary | ICD-10-CM | POA: Diagnosis not present

## 2023-09-11 DIAGNOSIS — M5412 Radiculopathy, cervical region: Secondary | ICD-10-CM | POA: Diagnosis not present

## 2023-09-13 ENCOUNTER — Encounter: Payer: Self-pay | Admitting: Psychiatry

## 2023-09-13 ENCOUNTER — Ambulatory Visit: Payer: Medicare HMO | Admitting: Psychiatry

## 2023-09-13 DIAGNOSIS — G8929 Other chronic pain: Secondary | ICD-10-CM

## 2023-09-13 DIAGNOSIS — F5105 Insomnia due to other mental disorder: Secondary | ICD-10-CM

## 2023-09-13 DIAGNOSIS — F3341 Major depressive disorder, recurrent, in partial remission: Secondary | ICD-10-CM

## 2023-09-13 DIAGNOSIS — M5489 Other dorsalgia: Secondary | ICD-10-CM | POA: Diagnosis not present

## 2023-09-13 DIAGNOSIS — F900 Attention-deficit hyperactivity disorder, predominantly inattentive type: Secondary | ICD-10-CM | POA: Diagnosis not present

## 2023-09-13 MED ORDER — LISDEXAMFETAMINE DIMESYLATE 50 MG PO CAPS: 50.0000 mg | ORAL_CAPSULE | Freq: Every day | ORAL | 0 refills | Status: DC

## 2023-09-13 NOTE — Progress Notes (Signed)
Madeline Young [id number] [birth year] 67 y.o.   Subjective:   Patient ID:  Madeline Young is a 67 y.o. (DOB 1956-10-06) female.  Chief Complaint:  Chief Complaint  Patient presents with   Follow-up   Depression   Anxiety   Other    pain   ADD    Depression        Associated symptoms include decreased concentration.  Associated symptoms include no suicidal ideas.  Madeline Young presents to the office today for follow-up of ADD and anxiety and depression and sleep.  seen August 2020.  No meds were changed.  On Vyvanse 50, fluoxetine 20, and Ambien 5.  seen November 30, 2019.  The following was noted: "Down".  Doesn't feel Vyvanse helping as much.  More scattered and doesn't finish things.  No SE with it. Sleep pattern is worse as Covid progressed.  Had reduced Zolpidem to 5 mg but then increased to 7.5 mg HS.  If takes Benadryl but can sleep but then has crying spells after it and hangover.  In the AM feels more negative and tearful.  AM is hard bc stiff in the morning. Plan:Continue Vyvanse 50 mg AM Increase fluoxetine to 30 mg daily.  She doesn't want to go to 40 bc fear of serotonin syndrome.  Thinks she had some of those sx in the past Continue zolpidem 5 mg daily.  01/29/20 appt, reported:  Better with increased fluoxetine re: mood. No SE with meds. Still ADD problems not as well controlled as she'd like. Still problems with sleep and doesn't think ambien workiing as well as it should.  Tried trazodone with Ambien but had hangover and difficulty waking up and not a deep good sleep.  Never tried trazodone alone.  Increased Ambien on her own to 10 mg and ran out early.  Reduced to 5 mg and added Benadryl.  Erratic sleep since Covid.  Go to bed same time 10.  Avoids alcohol 2 hours before. Primary problem is going to sleep.  History of 10 mg Ambien for years.  Says 7.5 mg Ambien will work. Caffeine none after noon. Plan: Increase Vyvanse 60 mg AM continue  fluoxetine to 30 mg daily.  She doesn't want to go to 40 bc fear of serotonin syndrome.  Thinks she had some of those sx in the past.  It helped to increase Increase zolpidem 5-10 mg daily.  08/02/2020 appointment with the following noted: Went back down to 20 mg fluoxetine bc of more HA and fear of serotonin syndrome bc she feels she had it at some point in the past.  Taking 20 mg for mos. Still problems with sleep.  Down to 2.5 mg Ambien bc when takes more feels out of it the next day.   Thinks taking Ambien too long. Too much awakening. No marked benefit with increased Vyvanse to 60 mg and feels more irritable and edgy. However still distractible and inefficient and hard to finish things. Forgetful and loses things. Plan: Reduce Vyvanse to 50 mg AM continue fluoxetine to 20 mg daily DT her fear of serotonin syndrome.  Thinks she had some of those sx in the past.  May have reduced benefit DT lower dose. Trial Belsomra 15-20 mg or Dayvigo for sleep instead of Ambien bc hangover with 5 mg daily.  11/01/2020 appointment with the following noted: Phone call 08/16/2020:Rtc to patient and she did try the Belsomra and reports having the "hang over and drowsy" effect. Same as the trazodone.  She is asking to try Lunesta instead.  10/07/2020 she called back again stating she is wanted to go back to Ambien 5 mg. Ambien seems to work again and getting 8 hours now. Re: reduction vyvanse to 50 mg.  Now feels that it's now working well.  Very distractible and not finishing things.  Not affordable to take the biologicals any more and had to stop but pain is not worse. Plan increase Vyvanse to 60 mg daily. For sleep return to Ambien 5 mg nightly because of failures of alternatives.  03/08/2021 appointment with the following noted: Doing good overall.   Taking 7.5 mg Ambien.  Need to sleep and satisfied with it now. Overall in a good place emotionally and mentally.  Satisfied with meds. No SE  now. Tolerated Vyvanse 60 fine and pleased with it.   Prednisone back a couple of weeks ago.  Pain can awaken her also. Pleased to sell property. Plan: no med changes  09/07/2021 appointment with the following noted: Worst couple of mos.   July flair up of arthritis, the worst ever related to stress, the heat and over exertion. Took 5 mos to see new rheumatologist and sees them tomorrow. Got more down with pain. On prednisone it helped.  On 20 mg prednisone had irritability and ADD worse and insomnia.  Forgetful, distractible to marked degree.  Trouble making decisions.  Rewriting lists of things to do wasting time.  Better cognition with less prednisone.  Would rather hurt than be like this. Almost nonfunctional at this time.  Always doing things.   Having BA remodeled.  Sleep 6-7 hours. Plan: Yes trial Namenda off label. continue fluoxetine to 20 mg daily DT her fear of serotonin syndrome.  Thinks she had some of those sx in the past.  May have reduced benefit DT lower dose. Per her request to increase Vyvanse to 70 mg every morning for better benefit for cognition.  11/10/2021 appointment with the following noted: Not on Namenda at this time. New rheum dx CPPD and wondering if she had psoriatic arthritis and wanted to try new drugs. Lives in historic house with a lot of stairs.  At [facility] with more problems with joints.  Worry over new doctor and new dx with FU MRI 2 weeks ago.  Worst flare up ever and took her down mentally.  Pain interfered with sleep.  Missed 2 mos of hair appts.  Couldn't function DT pain.Need to restart prednisone and get injections from Dr. Clydene Pugh yesterday.  Prednisone helped. Thought Namenda helped some but caused dizziness.  Stopped it. Had gotten down to 1/4 Ambien and melatonin but prednisone increased insomnia. Back on 5 mg Ambien. Inflammatory flareup if overworks. Got mild Covid and H Covid with no sx. Health stress and health care issues. Still all over  the place. Plan no med chages  05/15/2022 appointment noted: Taking Vyvanse 60 bc can't get 70's Overall doing well lately.  [place] and did [place]. GD born early [place] beach and she went to help them.  Born at 34 weeks and GI issues.   D from [place] came to visit for a couple of weeks.   RA flare up.  Ongoing problem. Mood is ok.    11/16/22 appt noted: Ongoing med px. No med changes Vyvanse 70 AM, fluoxetine 20 mg daily Renovation triggering bursitis affecting sleep DT pain and psoriasis flares.  Going to [facility] Arthritis is severe and problematic.   Stress medical bills.  Not markedly depressed but down over medical problems with severe arthritis. Some memory issues.  Not severe.   Still starts things she doesn't finish. No SE. D last baby born premature.   Plan no changes  05/17/23 appt noted:  also disc with D Anna Meds as above No SE A lot has happened.  Infusions for psoriatic arthritis.  Noticed more joint and muscle pain but tried 3 and got worse.  CBP got worse.  Some sciatica.    Hx fusion 2017.   Got so bad had to use walker. Dx scoliosis and severe disc bulging.  Was told not a surgical candidate by prior surgeon.  2nd opinion in [facility] said same thing.  In constant pain and cry all the time.   Asks about switch to modafinil. D noticed before pain meds, still spacey, disorganized, irritable.  Wonders vyvanse is causing some of these SE.   Plan: Ok switch to modafinil 100 mg AM for 1 week then 200 mg and DC Vyvanse. Switch duloxetine to 60 mg daily.  For mood and potentially for pain.  Able to stop Ambien.  Using melatonin and CBD gummy  06/01/23 TC about fears of taking duloxetine.  She will decide whether to take it or not.    07/20/23 TC:  Pt was called @ 10:20a for follow up appt because of the recent refill request from the pharmacy.  She said the bottle she has has Duloxetine HCL which is different than the one she was taking.  She also states  she went from 30mg  to 60mg  to 90mg .  She wants someone to call her back confirm the medication and the dosage.     Patient reports that she is taking 120 mg of duloxetine and has for about 1-1/2 to 2 weeks.  Rx was sent for #90 tablets but one every day. She said you told her that she could take more. Even though it hasn't been that long that she has been taking this dose she feels like she is seeing some benefit and is reporting no SE. She wants to know if she can take two 60 mg tablets together or if she should do AM and PM. She has FU 12/5.     MD resp:  She can take 2 of the duloxetine 60 mg daily if she wants to try it for mood and chronic pain.  Higher doses can help pain.  The SE to look for is excessive sweating.     09/13/23 appt noted:  seen in person. Psych meds: duloxetine 120 AM, modafinil 200 up until a week ago, on oxycodone 5 q 6 hr. Trouble getting into pain clinic.  Multiple joint px including back and others.  Will not get better.  H is 67 yo. Switch to duloxetine helped a little with pain and mood.  Good change.   Still cry all the time.  Disruptive.   Dealing with so much change in her life worse with holidays. Need to switch back to Vyvanse for ADD.  Had some left and resumed some Vyvanse left over a week ago.  Helps her focus and productivity. D moved back to [place].  They bought a house in [place] outside [place].  Other D going through divorce in [place].   GS Fraser Din 69 yo is doing relatively well but needs constant care.   Modafinil helped alertness but not focus.  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  Helping to care for 70-month-old  grandbaby in [place].  Past Psychiatric Medication Trials:  trazodone hangover, Ambien, belsomra, Dayvigo to expensive, melatonin, Benadryl hangover and combos (Lunesta never tried) Xanax hangover Fluoxetine 60, sertraline NR, duloxetine, Lexapro, Wellbutrin, buspirone,  Ritalin LA, Concerta 54,  Adderall 20 dizzy, Vyvanse 60 Lyrica sedation  Under care at this office since July 2002   Review of Systems:  Review of Systems  Cardiovascular:  Negative for palpitations.  Musculoskeletal:  Positive for arthralgias, back pain, gait problem and joint swelling.  Skin:  Positive for rash.  Neurological:  Negative for dizziness and tremors.  Psychiatric/Behavioral:  Positive for decreased concentration and sleep disturbance. Negative for agitation, behavioral problems, confusion, dysphoric mood, hallucinations, self-injury and suicidal ideas. The patient is not nervous/anxious and is not hyperactive.     Medications: I have reviewed the patient's current medications.  Current Outpatient Medications  Medication Sig Dispense Refill   acetaminophen (TYLENOL) 500 MG tablet Take 1,000 mg by mouth every 6 (six) hours as needed.     alendronate (FOSAMAX) 70 MG tablet Take 70 mg by mouth once a week.     aspirin 81 MG tablet Take 81 mg by mouth daily.     b complex vitamins capsule Take 1 capsule by mouth daily.     calcium carbonate (OS-CAL - DOSED IN MG OF ELEMENTAL CALCIUM) 1250 (500 Ca) MG tablet      celecoxib (CELEBREX) 200 MG capsule TAKE 1 CAPSULE BY MOUTH TWICE A DAY 180 capsule 1   cholecalciferol (VITAMIN D) 1000 UNITS tablet Take 2,000 Units by mouth daily.     clobetasol (TEMOVATE) 0.05 % external solution as needed.     diclofenac Sodium (VOLTAREN) 1 % GEL APPLY 2 GRAMS TO AFFECTED AREA 4 TIMES A DAY 300 g 3   DULoxetine (CYMBALTA) 60 MG capsule Take 2 capsules (120 mg total) by mouth daily. 180 capsule 0   fluocinonide ointment (LIDEX) 0.05 % APPLY TWICE DAILY TO AFFECTED AREAS UNTIL IMPROVED THEN AS NEEDED FOR FLARES     furosemide (LASIX) 20 MG tablet TAKE 1 TABLET BY MOUTH EVERY DAY AS NEEDED 90 tablet 0   ipratropium (ATROVENT) 0.03 % nasal spray Place into both nostrils.     leflunomide (ARAVA) 20 MG tablet Take 20 mg by mouth daily.     lisdexamfetamine (VYVANSE) 50 MG  capsule Take 1 capsule (50 mg total) by mouth daily. 30 capsule 0   Magnesium 250 MG CAPS Take by mouth. Take two by mouth daily     melatonin 5 MG TABS Take 5 mg by mouth at bedtime.     Multiple Vitamin (MULTIVITAMIN) tablet Take 1 tablet by mouth daily.     nystatin-triamcinolone ointment (MYCOLOG) Apply 1 application topically 2 (two) times daily. 30 g 1   Omega-3 Fatty Acids (FISH OIL) 1200 MG CAPS Take 1,200 mg by mouth daily.      oxyCODONE-acetaminophen (PERCOCET/ROXICET) 5-325 MG tablet Take 1 tablet by mouth every 6 (six) hours as needed for moderate pain (pain score 4-6). Do not refill less than 30 days from prior refill 120 tablet 0   oxyCODONE-acetaminophen (PERCOCET/ROXICET) 5-325 MG tablet Take 1 tablet by mouth every 6 (six) hours as needed for moderate pain (pain score 4-6). Do not refill less than 30 days from prior refill 120 tablet 0   Polyethyl Glycol-Propyl Glycol 0.4-0.3 % SOLN Place 1 drop into both eyes daily as needed (for dry eyes).     predniSONE (DELTASONE) 10 MG tablet Take 10 mg by mouth  2 (two) times daily. Taking 15mg  currently due to flare     Probiotic Product (PROBIOTIC DAILY PO) Take 1 capsule by mouth daily.     solifenacin (VESICARE) 10 MG tablet Take 1 tablet (10 mg total) by mouth daily. 30 tablet 2   triamcinolone cream (KENALOG) 0.1 % APPLY TO AFFECTED AREA TWICE A DAY UNTIL CLEAR THEN AS NEEDED     Turmeric 500 MG TABS Take 1 tablet by mouth daily.     modafinil (PROVIGIL) 200 MG tablet Take 1 tablet (200 mg total) by mouth daily. TAKE 1 (200 MG) TABLET EACH MORNING. (Patient not taking: Reported on 09/13/2023) 30 tablet 1   No current facility-administered medications for this visit.    Medication Side Effects: None  Allergies:  Allergies  Allergen Reactions   Cosentyx [Secukinumab] Rash    Severe case of psorasis   Sertraline Hcl Other (See Comments)    Made me crazy    Adalimumab     Other Reaction(s): psoriasis   Avelox [Moxifloxacin Hcl  In Nacl]     Pt cant remember reaction   Nitrofurantoin     Other reaction(s): Other (See Comments) tired Pt cant remember reaction Other reaction(s): Unknown   Pregabalin     Cognitive issues   Rosuvastatin     Other reaction(s): Unknown   Latex Itching and Rash    Redness   Macrobid [Nitrofurantoin Monohyd Macro] Other (See Comments)    tired   Statins Other (See Comments)    Muscle aches    Past Medical History:  Diagnosis Date   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   ADHD (attention deficit hyperactivity disorder)    Allergy    Anxiety    Arthritis    Cataract    bil cateracts removed   Complication of anesthesia    first colonoscopy pt states she woke up   Depression    GERD (gastroesophageal reflux disease)    Globus sensation    Hyperlipidemia    Incontinence    bowels at times   Insomnia    Multinodular goiter (nontoxic)    Psoriatic arthritis (HCC)    Spinal stenosis of lumbar region 03/10/2015   MRI    Statin intolerance 01/27/2013    Family History  Problem Relation Age of Onset   Heart disease Mother    Hyperlipidemia Mother    Coronary artery disease Father 61       CABG in early 79's   Hyperlipidemia Father    Epilepsy Grandchild        Severe form   Colon cancer Neg Hx    Esophageal cancer Neg Hx    Rectal cancer Neg Hx    Stomach cancer Neg Hx    Colon polyps Neg Hx    Crohn's disease Neg Hx     Social History   Socioeconomic History   Marital status: Married    Spouse name: Not on file   Number of children: 2   Years of education: Not on file   Highest education level: Not on file  Occupational History   Occupation: disability  Tobacco Use   Smoking status: Former    Current packs/day: 0.00    Types: Cigarettes    Quit date: 08/21/1971    Years since quitting: 52.0    Passive exposure: Never   Smokeless tobacco: Never  Vaping Use   Vaping status: Never Used  Substance and Sexual Activity   Alcohol use: Yes  Alcohol/week: 7.0 standard drinks of alcohol    Types: 7 Glasses of wine per week    Comment: daily,wine   Drug use: No   Sexual activity: Not on file  Other Topics Concern   Not on file  Social History Narrative   Married, retired Charity fundraiser   2 daughters some grandchildren   1 caffeinated drinks daily   1 alcoholic beverage daily   No tobacco      Social Determinants of Health   Financial Resource Strain: Low Risk  (08/20/2023)   Overall Financial Resource Strain (CARDIA)    Difficulty of Paying Living Expenses: Not hard at all  Food Insecurity: No Food Insecurity (08/20/2023)   Hunger Vital Sign    Worried About Running Out of Food in the Last Year: Never true    Ran Out of Food in the Last Year: Never true  Transportation Needs: No Transportation Needs (08/20/2023)   PRAPARE - Administrator, Civil Service (Medical): No    Lack of Transportation (Non-Medical): No  Physical Activity: Inactive (08/20/2023)   Exercise Vital Sign    Days of Exercise per Week: 0 days    Minutes of Exercise per Session: 0 min  Stress: Stress Concern Present (08/20/2023)   [employer] of Occupational Health - Occupational Stress Questionnaire    Feeling of Stress : Very much  Social Connections: Moderately Integrated (08/20/2023)   Social Connection and Isolation Panel [NHANES]    Frequency of Communication with Friends and Family: More than three times a week    Frequency of Social Gatherings with Friends and Family: Twice a week    Attends Religious Services: Never    Database administrator or Organizations: Yes    Attends Engineer, structural: More than 4 times per year    Marital Status: Married  Catering manager Violence: Not At Risk (08/20/2023)   Humiliation, Afraid, Rape, and Kick questionnaire    Fear of Current or Ex-Partner: No    Emotionally Abused: No    Physically Abused: No    Sexually Abused: No    Past Medical History, Surgical history, Social  history, and Family history were reviewed and updated as appropriate.   Please see review of systems for further details on the patient's review from today.   Objective:   Physical Exam:  There were no vitals taken for this visit.  Physical Exam Constitutional:      General: She is not in acute distress.    Appearance: Normal appearance. She is well-developed.  Musculoskeletal:        General: No deformity.  Neurological:     Mental Status: She is alert and oriented to person, place, and time.     Motor: No tremor.     Gait: Gait normal.     Comments: Using walker  Psychiatric:        Attention and Perception: Perception normal. She is inattentive.        Mood and Affect: Mood is anxious and depressed. Affect is not labile, angry or inappropriate.        Speech: Speech normal.        Behavior: Behavior normal.        Thought Content: Thought content normal. Thought content is not delusional. Thought content does not include homicidal or suicidal ideation. Thought content does not include suicidal plan.        Cognition and Memory: Cognition normal.        Judgment: Judgment normal.  Comments: Insight intact. No auditory or visual hallucinations. No delusions.  Talkative per usual. Mood less down with duloxetine Some chronic ADD     Lab Review:     Component Value Date/Time   NA 139 05/16/2023 1434   K 4.4 05/16/2023 1434   CL 101 05/16/2023 1434   CO2 29 05/16/2023 1434   GLUCOSE 110 (H) 05/16/2023 1434   BUN 23 05/16/2023 1434   CREATININE 0.75 05/16/2023 1434   CALCIUM 9.9 05/16/2023 1434   PROT 6.5 05/16/2023 1434   ALBUMIN 4.2 05/16/2023 1434   AST 21 05/16/2023 1434   ALT 22 05/16/2023 1434   ALKPHOS 45 05/16/2023 1434   BILITOT 0.6 05/16/2023 1434   GFRNONAA >60 02/18/2016 0322   GFRAA >60 02/18/2016 0322       Component Value Date/Time   WBC 6.6 05/16/2023 1434   RBC 3.47 (L) 05/16/2023 1434   HGB 11.3 (L) 05/16/2023 1434   HCT 34.7 (L)  05/16/2023 1434   PLT 326.0 05/16/2023 1434   MCV 99.9 05/16/2023 1434   MCH 31.0 02/18/2016 0322   MCHC 32.6 05/16/2023 1434   RDW 14.5 05/16/2023 1434   LYMPHSABS 0.9 05/16/2023 1434   MONOABS 0.5 05/16/2023 1434   EOSABS 0.0 05/16/2023 1434   BASOSABS 0.1 05/16/2023 1434    No results found for: "POCLITH", "LITHIUM"   No results found for: "PHENYTOIN", "PHENOBARB", "VALPROATE", "CBMZ"   .res Assessment: Plan:    Madeline "Milli" was seen today for follow-up, depression, anxiety, other and add.  Diagnoses and all orders for this visit:  Depression, major, recurrent, in partial remission (HCC)  Attention deficit hyperactivity disorder (ADHD), predominantly inattentive type -     lisdexamfetamine (VYVANSE) 50 MG capsule; Take 1 capsule (50 mg total) by mouth daily.  Insomnia due to mental condition  Other chronic back pain    psoriatic arthritis, OA, and inflammatory   40 min with patient was spent on counseling and coordination of care. We discussed the following.   ADD not as well controlled. Some mood and pain benefit with duloxetine 120 and tolerating it. But crying excessively despite mood improvment  Discussed potential benefits, risks, and side effects of stimulants with patient to include increased heart rate, palpitations, insomnia, increased anxiety, increased irritability, or decreased appetite.  Instructed patient to contact office if experiencing any significant tolerability issues.  Disc risk steroids affecting mood and sleep.  Supportive therapy dealing with stubborn H with memory problems. Disc  rheum consultation and stress from dealing with it.    Resume Vyvanse 50 mg for ADD in place of modafinil. . Looking for better focus.  Consider memantine off label because of chronic poorly controlled ADHD. Option Qelbree Option retry lower dose Namenda off label.  Better mood and pain with Switch duloxetine to 120 mg daily. For mood and potentially  for pain. Cannot go higher.   Auvelity for crying .  1 in AM.  Disc SE.  Disc info in detail about how DM can help crying.    Able to stop Ambien.  Using melatonin and CBD gummy  Consider alternative treatments for ADD including off label.  FU 8 weeks.  Meredith Staggers, MD, DFAPA   Please see After Visit Summary for patient specific instructions.  Future Appointments  Date Time Provider Department Center  09/17/2023  1:00 PM Cira Rue, PT [facility] None  09/24/2023 11:15 AM Cira Rue, PT [facility] None  10/16/2023 11:15 AM Cira Rue, PT [facility] None  10/18/2023 11:15 AM Cira Rue, PT [facility] None  10/23/2023 11:15 AM Cira Rue, PT [facility] None  10/25/2023 11:15 AM Cira Rue, PT [facility] None  10/30/2023 11:15 AM Cira Rue, PT [facility] None  11/01/2023 11:15 AM Cira Rue, PT [facility] None  11/13/2023  2:00 PM Sherlene Shams, MD [facility]  08/22/2024 10:50 AM [facility] ANNUAL WELLNESS VISIT [facility]    No orders of the defined types were placed in this encounter.      -------------------------------

## 2023-09-17 ENCOUNTER — Ambulatory Visit: Payer: Medicare HMO | Attending: Orthopedic | Admitting: Physical Therapy

## 2023-09-17 ENCOUNTER — Encounter: Payer: Self-pay | Admitting: Physical Therapy

## 2023-09-17 DIAGNOSIS — M5459 Other low back pain: Secondary | ICD-10-CM | POA: Diagnosis not present

## 2023-09-17 DIAGNOSIS — R262 Difficulty in walking, not elsewhere classified: Secondary | ICD-10-CM | POA: Insufficient documentation

## 2023-09-17 NOTE — Therapy (Addendum)
OUTPATIENT PHYSICAL THERAPY TREATMENT    Patient Name: Madeline Young MRN: [id number] DOB:27-Dec-1955, 67 y.o., female Today's Date: 09/17/2023  END OF SESSION:  PT End of Session - 09/17/23 1435     Visit Number 18    Number of Visits 20    Date for PT Re-Evaluation 10/22/23    Authorization Type [employer] 2024 reporting period from  07/30/2023    Progress Note Due on Visit 10    PT Start Time 1303    PT Stop Time 1347    PT Time Calculation (min) 44 min    Equipment Utilized During Treatment Gait belt    Activity Tolerance Patient tolerated treatment well    Behavior During Therapy WFL for tasks assessed/performed                  Past Medical History:  Diagnosis Date   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   ADHD (attention deficit hyperactivity disorder)    Allergy    Anxiety    Arthritis    Cataract    bil cateracts removed   Complication of anesthesia    first colonoscopy pt states she woke up   Depression    GERD (gastroesophageal reflux disease)    Globus sensation    Hyperlipidemia    Incontinence    bowels at times   Insomnia    Multinodular goiter (nontoxic)    Psoriatic arthritis (HCC)    Spinal stenosis of lumbar region 03/10/2015   MRI    Statin intolerance 01/27/2013   Past Surgical History:  Procedure Laterality Date   BACK SURGERY  02/17/2016   Dr. Leotis Shames- L4-L5 fusion    BIOPSY THYROID  2014   COLONOSCOPY     COLONOSCOPY W/ POLYPECTOMY     EYE SURGERY     bilateral cataract surgery w/ lens implant   FOOT SURGERY  2006   Right foot , secondary to severe loss of joint [facility])   POLYPECTOMY     TONSILLECTOMY     UPPER GASTROINTESTINAL ENDOSCOPY  2005   With empiric esophageal dilation   Patient Active Problem List   Diagnosis Date Noted   Unintentional Tylenol overdose, sequela 08/14/2023   Chronic pain not due to malignancy 08/14/2023   Slac (scapholunate advanced collapse) of wrist, left 06/03/2023   Mass of abdomen  05/23/2023   Urge incontinence 05/16/2022   Onychomycosis 08/17/2021   Drug allergy 06/29/2021   Fibromyalgia 06/29/2021   Generalized osteoarthritis 06/29/2021   Hand joint pain 06/29/2021   Other intervertebral disc degeneration, lumbar region 06/29/2021   Other long term (current) drug therapy 06/29/2021   Sciatica 01/28/2020   Rhinitis, non-allergic 11/10/2019   Insomnia 08/10/2019   Anxiety 10/11/2018   Carotid artery stenosis, asymptomatic, bilateral 10/29/2016   ADD (attention deficit disorder) 10/29/2016   Spondylolisthesis of lumbar region 02/17/2016   Globus sensation since at least 2005 04/29/2015   GERD (gastroesophageal reflux disease) 11/17/2014   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   Travel advice encounter 01/27/2013   Multinodular goiter (nontoxic) 08/11/2012   Overweight (BMI 25.0-29.9) 08/11/2012   Psoriatic arthritis, destructive type (HCC)    Screening for cervical cancer 08/21/2011   Screening for breast cancer 08/21/2011   Hx of adenomatous polyp of colon 10/06/2008   Familial hyperlipidemia 09/15/2008   Major depressive disorder, single episode, in remission (HCC) 09/15/2008   ALLERGIC RHINITIS 09/15/2008    PCP: Dr. Duncan Dull   REFERRING PROVIDER: Duke Salvia NP   REFERRING DIAG: Spinal  stenosis of lumbar region  Rationale for Evaluation and Treatment: Rehabilitation  THERAPY DIAG:  Other low back pain  Difficulty in walking, not elsewhere classified  ONSET DATE: 2017   PERTINENT HISTORY:  Pt reports that she has three different types of arthritis: osteo, rheumatoid, and psoriatic. She had lumbar fusion in 2017 which she felt did not help significantly. She had an infusion for her psoriatic arthritis this past spring and it caused her low back pain. She recently had a spinal injection and this resolved radiating pain on her LLE, but she still has excruciating, radiating pain down her right lower extremity. She feels unsteady most of the time and she  has to use walker to keep from falling. Pt has also been told that she needs a reverse total shoulder because right shoulder RTC damage.   SUBJECTIVE:                                                                                                                                                                                           SUBJECTIVE STATEMENT: Patient has been doing pretty well since last session, but balance has been challenging and fear of falling.  She did her exercises last on Saturday and has been very active. She reports still being on steroids, but at a lower dose.  She also reports starting Vyvanse for her ADHD.  PAIN:  Are you having pain? Yes  NPRS: 5/10 at the front of lift hip, 5/10 across low back and needs to sit to relieve it  PRECAUTIONS: None  PATIENT GOALS: To improve balance and to decrease   OBJECTIVE   TODAY'S TREATMENT:                                                         Neuromuscular Re-education: to improve, balance, postural strength, muscle activation patterns, and stabilization strength required for functional activities:  - seated (on clear tharaball) single arm row 20 # cable, 2x8  CGA-min assist  - seated (on clear theraball), march with hold, 1x10 each side with 5 second hold and pauses for cuing to help patient improve balance and posture. x5 each side with 5 second hold  (challenging)  Seated on clear theraball SA row with ipsi foot off floor 10# cable - 3 sets each side to fatigue with CGA-min assist (challenging to keep her balance lifting her right leg)  Blazepod taps with 4 blazepods 3x1 minute blazepod tap: just hip  flexion on airrex pad: alternating legs (15-20 hits) - cues to stay tall through left leg, as it was challenging for her to resist leaning  Therex  Single-leg press 4 sets: Set 1: 10x20 lbs Set 2: 10x25 lbs Set 3: 10x35 lbs Set 4: 5x45 lbs (almost to failure)     Pt required multimodal cuing for proper technique and  to facilitate improved neuromuscular control, strength, range of motion, and functional ability resulting in improved performance and form.  PATIENT EDUCATION:  Education details: form and technique for correct performance of exercise.  Person educated: Patient Education method: Explanation, Demonstration, Verbal cues, and Handouts Education comprehension: verbalized understanding, returned demonstration, and verbal cues required  HOME EXERCISE PROGRAM: Access Code: [email or web address] URL: [email or web address] Date: 08/16/2023 Prepared by: Norton Blizzard  Exercises - Sit to Stand  - 3-4 x weekly - 2 sets - 7-10 reps - Walking with Head Nod  - 3-4 x weekly - 3 sets - 10 reps - Standing March with Counter Support  - 3-4 x weekly - 3 sets - 10 reps - Standing Toe Taps  - 3-4 x weekly - 3 sets - 10 reps - Seated Single Arm Shoulder Row with Anchored Resistance  - 3-7 x weekly - 2-3 sets - 15 reps - 2 seconds hold  HOME EXERCISE PROGRAM [[id number]] View at [email or web address]" using code: [id number] Supine Marching (phase 2) - 'up up, down down' -  Repeat 10 Repetitions, Complete 2 Sets, Perform 2 Times a Day  SCIATIC NERVE GLIDE -  Repeat 15 Repetitions, Complete 1 Set, Perform 2    ASSESSMENT:  CLINICAL IMPRESSION: Patient arrives with [facility] continuing to feel "pretty good."  She continues to show difficulty with balance exercises, particularly when lifting her right lower extremity. This was particularly challenging on the theraball, where she struggled to lift her right leg without significantly altering posture and leaning left to compensate.  She responded well to marching exercises on the airex pad with cues to stand tall through her left leg.  She also responded better to the single-leg press this week then before (she was able to push significantly more weight) when weight was gradually increased every set as a warm up. Patient would benefit from continued management of limiting  condition by skilled physical therapist to address remaining impairments and functional limitations to work towards stated goals and return to PLOF or maximal functional independence.    OBJECTIVE IMPAIRMENTS: Abnormal gait, decreased balance, decreased endurance, decreased knowledge of use of DME, decreased mobility, difficulty walking, decreased ROM, decreased strength, hypomobility, impaired flexibility, impaired sensation, impaired UE functional use, and pain.   ACTIVITY LIMITATIONS: carrying, lifting, bending, standing, squatting, sleeping, stairs, bed mobility, bathing, toileting, dressing, reach over head, hygiene/grooming, and locomotion level  PARTICIPATION LIMITATIONS: cleaning, laundry, driving, shopping, community activity, and yard work  PERSONAL FACTORS: Past/current experiences, Time since onset of injury/illness/exacerbation, and 3+ comorbidities: psoriatic and rheumatoid arthritis,   are also affecting patient's functional outcome.   REHAB POTENTIAL: Fair chronicity of low back and multiple co-morbidities  CLINICAL DECISION MAKING: Evolving/moderate complexity  EVALUATION COMPLEXITY: Moderate   GOALS: Goals reviewed with patient? No  SHORT TERM GOALS: Target date: 06/05/2023  Pt will be independent with HEP in order to improve strength and balance in order to decrease fall risk and improve function at home and work. Baseline: NT 06/05/23: Able to perform independently  Goal status: Achieved   2.  Pt will increase by at least 0.13 m/s in order to demonstrate clinically  significant improvement in community ambulation.  Baseline:  0.81 m/sec; 0.82 m/s (07/30/2023);  Goal status: ONGOING   3.  Pt will increase by at least 10m (180ft) in order to demonstrate clinically significant improvement in cardiopulmonary endurance and community ambulation Baseline:  800 ft; 805 feet with SPC (07/30/2023);  Goal status: Ongoing   4.  Patient will show correct use of  single point cane and rollator for improved ambulation and to decrease risk of falling.  Baseline: NT 06/04/23: Using SPC  Goal status: ACHIEVED    LONG TERM GOALS: Target date: 07/31/2023. Target date updated to 10/22/2023 for all unmet goals on 07/30/2023.   Patient will have improved function and activity level as evidenced by an increase in FOTO score by 10 points or more.  Baseline: 40/100 with target of 47: 39 at visit #10 (07/30/2023);  Goal status: ONGOING   2.  Patient will ambulate >=1000 ft for as evidence of improved aerobic endurance and lumbar function.  Baseline: 800 ft; 805 feet with SPC (07/30/2023);  Goal status: ONGOING   3. Patient will perform 10 meter walk test in >=1 m/sec for improved mobility and to decrease her risk of falling. Baseline: 0.81 m/sec; 0.82 m/s (07/30/2023);  Goal status: ONGOING   4.  Patient will demonstrate an improvement in DGI score of >=2 pts as evidence of improved dynamic balance to decrease risk of falling (Pardasaney et al, 2012). Baseline: 14/24; 15/24 (07/30/2023);  Goal status: ONGOING   PLAN:  PT FREQUENCY: 1-2x/week  PT DURATION: 12 weeks  PLANNED INTERVENTIONS: Therapeutic exercises, Therapeutic activity, Neuromuscular re-education, Balance training, Gait training, Patient/Family education, Self Care, Joint mobilization, Joint manipulation, Stair training, Vestibular training, Canalith repositioning, DME instructions, Aquatic Therapy, Dry Needling, Electrical stimulation, Spinal manipulation, Spinal mobilization, Cryotherapy, Moist heat, Traction, Ultrasound, Manual therapy, and Re-evaluation.  PLAN FOR NEXT SESSION: update HEP as appropriate, progressive LE/trunk/functional strengthening, motor control, and dynamic balance exercises.   Delvecchio Madole, [place], Student-PT    [place]. Ilsa Iha, PT, DPT, Cert. MDT 09/17/23, 2:53 PM  [facility] [address] [place], [place] [address] P:  [phone] I F: [phone]

## 2023-09-20 ENCOUNTER — Encounter: Payer: Self-pay | Admitting: Nurse Practitioner

## 2023-09-20 ENCOUNTER — Ambulatory Visit: Payer: Medicare HMO | Admitting: Nurse Practitioner

## 2023-09-20 VITALS — BP 124/84 | HR 93 | Temp 97.8°F | Ht 66.0 in | Wt 153.8 lb

## 2023-09-20 DIAGNOSIS — J0140 Acute pansinusitis, unspecified: Secondary | ICD-10-CM

## 2023-09-20 LAB — POCT INFLUENZA A/B
Influenza A, POC: NEGATIVE
Influenza B, POC: NEGATIVE

## 2023-09-20 MED ORDER — PREDNISONE 20 MG PO TABS: 40.0000 mg | ORAL_TABLET | Freq: Every day | ORAL | 0 refills | Status: AC

## 2023-09-20 MED ORDER — DOXYCYCLINE HYCLATE 100 MG PO TABS: 100.0000 mg | ORAL_TABLET | Freq: Two times a day (BID) | ORAL | 0 refills | Status: AC

## 2023-09-20 NOTE — Patient Instructions (Signed)
Rx sent to the pharmacy. Take anti histamine over the counter, Continue to take mucinex. Increase fluid intake and rest

## 2023-09-20 NOTE — Progress Notes (Signed)
Established Patient Office Visit  Subjective:  Patient ID: Madeline Young, female    DOB: 15-Mar-1956  Age: 67 y.o. MRN: [id number]  CC:  Chief Complaint  Patient presents with   Acute Visit    Cough, little SOB, green mucous from nose & lungs, achy, low grade fever, headache    HPI  Madeline Young presents for:  Cough This is a new problem. The current episode started in the past 7 days. The problem has been gradually improving. Associated symptoms include nasal congestion and rhinorrhea. Pertinent negatives include no wheezing. Associated symptoms comments: Chest congestion. The symptoms are aggravated by lying down. Treatments tried: tylenol. mucinex, sudafed, saline nasal spray.     Home COVID test negtaive.  Past Medical History:  Diagnosis Date   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   ADHD (attention deficit hyperactivity disorder)    Allergy    Anxiety    Arthritis    Cataract    bil cateracts removed   Complication of anesthesia    first colonoscopy pt states she woke up   Depression    GERD (gastroesophageal reflux disease)    Globus sensation    Hyperlipidemia    Incontinence    bowels at times   Insomnia    Multinodular goiter (nontoxic)    Psoriatic arthritis (HCC)    Spinal stenosis of lumbar region 03/10/2015   MRI    Statin intolerance 01/27/2013    Past Surgical History:  Procedure Laterality Date   BACK SURGERY  02/17/2016   Dr. Leotis Shames- L4-L5 fusion    BIOPSY THYROID  2014   COLONOSCOPY     COLONOSCOPY W/ POLYPECTOMY     EYE SURGERY     bilateral cataract surgery w/ lens implant   FOOT SURGERY  2006   Right foot , secondary to severe loss of joint ([place])   POLYPECTOMY     TONSILLECTOMY     UPPER GASTROINTESTINAL ENDOSCOPY  2005   With empiric esophageal dilation    Family History  Problem Relation Age of Onset   Heart disease Mother    Hyperlipidemia Mother    Coronary artery disease Father 46       CABG in early 75's    Hyperlipidemia Father    Epilepsy Grandchild        Severe form   Colon cancer Neg Hx    Esophageal cancer Neg Hx    Rectal cancer Neg Hx    Stomach cancer Neg Hx    Colon polyps Neg Hx    Crohn's disease Neg Hx     Social History   Socioeconomic History   Marital status: Married    Spouse name: Not on file   Number of children: 2   Years of education: Not on file   Highest education level: Not on file  Occupational History   Occupation: disability  Tobacco Use   Smoking status: Former    Current packs/day: 0.00    Types: Cigarettes    Quit date: 08/21/1971    Years since quitting: 52.1    Passive exposure: Never   Smokeless tobacco: Never  Vaping Use   Vaping status: Never Used  Substance and Sexual Activity   Alcohol use: Yes    Alcohol/week: 7.0 standard drinks of alcohol    Types: 7 Glasses of wine per week    Comment: daily,wine   Drug use: No   Sexual activity: Not on file  Other Topics Concern   Not  on file  Social History Narrative   Married, retired Charity fundraiser   2 daughters some grandchildren   1 caffeinated drinks daily   1 alcoholic beverage daily   No tobacco      Social Drivers of Corporate investment banker Strain: Low Risk  (08/20/2023)   Overall Financial Resource Strain (CARDIA)    Difficulty of Paying Living Expenses: Not hard at all  Food Insecurity: No Food Insecurity (08/20/2023)   Hunger Vital Sign    Worried About Running Out of Food in the Last Year: Never true    Ran Out of Food in the Last Year: Never true  Transportation Needs: No Transportation Needs (08/20/2023)   PRAPARE - Administrator, Civil Service (Medical): No    Lack of Transportation (Non-Medical): No  Physical Activity: Inactive (08/20/2023)   Exercise Vital Sign    Days of Exercise per Week: 0 days    Minutes of Exercise per Session: 0 min  Stress: Stress Concern Present (08/20/2023)   [employer] of Occupational Health - Occupational Stress  Questionnaire    Feeling of Stress : Very much  Social Connections: Moderately Integrated (08/20/2023)   Social Connection and Isolation Panel [NHANES]    Frequency of Communication with Friends and Family: More than three times a week    Frequency of Social Gatherings with Friends and Family: Twice a week    Attends Religious Services: Never    Database administrator or Organizations: Yes    Attends Engineer, structural: More than 4 times per year    Marital Status: Married  Catering manager Violence: Not At Risk (08/20/2023)   Humiliation, Afraid, Rape, and Kick questionnaire    Fear of Current or Ex-Partner: No    Emotionally Abused: No    Physically Abused: No    Sexually Abused: No     Outpatient Medications Prior to Visit  Medication Sig Dispense Refill   acetaminophen (TYLENOL) 500 MG tablet Take 1,000 mg by mouth every 6 (six) hours as needed.     alendronate (FOSAMAX) 70 MG tablet Take 70 mg by mouth once a week.     aspirin 81 MG tablet Take 81 mg by mouth daily.     b complex vitamins capsule Take 1 capsule by mouth daily.     calcium carbonate (OS-CAL - DOSED IN MG OF ELEMENTAL CALCIUM) 1250 (500 Ca) MG tablet      celecoxib (CELEBREX) 200 MG capsule TAKE 1 CAPSULE BY MOUTH TWICE A DAY 180 capsule 1   cholecalciferol (VITAMIN D) 1000 UNITS tablet Take 2,000 Units by mouth daily.     clobetasol (TEMOVATE) 0.05 % external solution as needed.     diclofenac Sodium (VOLTAREN) 1 % GEL APPLY 2 GRAMS TO AFFECTED AREA 4 TIMES A DAY 300 g 3   DULoxetine (CYMBALTA) 60 MG capsule Take 2 capsules (120 mg total) by mouth daily. 180 capsule 0   fluocinonide ointment (LIDEX) 0.05 % APPLY TWICE DAILY TO AFFECTED AREAS UNTIL IMPROVED THEN AS NEEDED FOR FLARES     furosemide (LASIX) 20 MG tablet TAKE 1 TABLET BY MOUTH EVERY DAY AS NEEDED 90 tablet 0   ipratropium (ATROVENT) 0.03 % nasal spray Place into both nostrils.     leflunomide (ARAVA) 20 MG tablet Take 20 mg by mouth  daily.     lisdexamfetamine (VYVANSE) 50 MG capsule Take 1 capsule (50 mg total) by mouth daily. 30 capsule 0   Magnesium 250 MG CAPS  Take by mouth. Take two by mouth daily     melatonin 5 MG TABS Take 5 mg by mouth at bedtime.     modafinil (PROVIGIL) 200 MG tablet Take 1 tablet (200 mg total) by mouth daily. TAKE 1 (200 MG) TABLET EACH MORNING. 30 tablet 1   Multiple Vitamin (MULTIVITAMIN) tablet Take 1 tablet by mouth daily.     nystatin-triamcinolone ointment (MYCOLOG) Apply 1 application topically 2 (two) times daily. 30 g 1   Omega-3 Fatty Acids (FISH OIL) 1200 MG CAPS Take 1,200 mg by mouth daily.      oxyCODONE-acetaminophen (PERCOCET/ROXICET) 5-325 MG tablet Take 1 tablet by mouth every 6 (six) hours as needed for moderate pain (pain score 4-6). Do not refill less than 30 days from prior refill 120 tablet 0   oxyCODONE-acetaminophen (PERCOCET/ROXICET) 5-325 MG tablet Take 1 tablet by mouth every 6 (six) hours as needed for moderate pain (pain score 4-6). Do not refill less than 30 days from prior refill 120 tablet 0   Polyethyl Glycol-Propyl Glycol 0.4-0.3 % SOLN Place 1 drop into both eyes daily as needed (for dry eyes).     predniSONE (DELTASONE) 10 MG tablet Take 10 mg by mouth 2 (two) times daily. Taking 15mg  currently due to flare     Probiotic Product (PROBIOTIC DAILY PO) Take 1 capsule by mouth daily.     solifenacin (VESICARE) 10 MG tablet Take 1 tablet (10 mg total) by mouth daily. 30 tablet 2   triamcinolone cream (KENALOG) 0.1 % APPLY TO AFFECTED AREA TWICE A DAY UNTIL CLEAR THEN AS NEEDED     Turmeric 500 MG TABS Take 1 tablet by mouth daily.     No facility-administered medications prior to visit.    Allergies  Allergen Reactions   Cosentyx [Secukinumab] Rash    Severe case of psorasis   Sertraline Hcl Other (See Comments)    Made me crazy    Adalimumab     Other Reaction(s): psoriasis   Avelox [Moxifloxacin Hcl In Nacl]     Pt cant remember reaction    Nitrofurantoin     Other reaction(s): Other (See Comments) tired Pt cant remember reaction Other reaction(s): Unknown   Pregabalin     Cognitive issues   Rosuvastatin     Other reaction(s): Unknown   Latex Itching and Rash    Redness   Macrobid [Nitrofurantoin Monohyd Macro] Other (See Comments)    tired   Statins Other (See Comments)    Muscle aches    ROS Review of Systems  HENT:  Positive for rhinorrhea.   Respiratory:  Positive for cough. Negative for wheezing.    Negative unless indicated in HPI.    Objective:    Physical Exam Constitutional:      Appearance: Normal appearance.  HENT:     Right Ear: Tympanic membrane normal. Tympanic membrane is not erythematous.     Left Ear: Tympanic membrane normal. Tympanic membrane is not erythematous.     Nose:     Right Turbinates: Not enlarged.     Left Turbinates: Not enlarged.     Right Sinus: Frontal sinus tenderness present. No maxillary sinus tenderness.     Left Sinus: Frontal sinus tenderness present. No maxillary sinus tenderness.     Mouth/Throat:     Mouth: Mucous membranes are moist.     Pharynx: No pharyngeal swelling, oropharyngeal exudate or posterior oropharyngeal erythema.     Tonsils: No tonsillar exudate.  Cardiovascular:     Rate  and Rhythm: Normal rate and regular rhythm.  Pulmonary:     Effort: Pulmonary effort is normal.     Breath sounds: Normal breath sounds. No stridor. No wheezing.  Neurological:     General: No focal deficit present.     Mental Status: She is alert and oriented to person, place, and time. Mental status is at baseline.  Psychiatric:        Mood and Affect: Mood normal.        Behavior: Behavior normal.        Thought Content: Thought content normal.        Judgment: Judgment normal.     BP 124/84   Pulse 93   Temp 97.8 F (36.6 C)   Ht 5\' 6"  (1.676 m)   Wt 153 lb 12.8 oz (69.8 kg)   SpO2 96%   BMI 24.82 kg/m  Wt Readings from Last 3 Encounters:  09/20/23 153 lb  12.8 oz (69.8 kg)  08/20/23 154 lb (69.9 kg)  08/13/23 156 lb (70.8 kg)     Health Maintenance  Topic Date Due   DEXA SCAN  08/16/2019   DTaP/Tdap/Td (2 - Td or Tdap) 08/09/2022   MAMMOGRAM  10/26/2022   COVID-19 Vaccine (6 - 2024-25 season) 10/06/2023 (Originally 06/10/2023)   Medicare Annual Wellness (AWV)  08/19/2024   Colonoscopy  05/25/2029   Pneumonia Vaccine 34+ Years old  Completed   INFLUENZA VACCINE  Completed   Hepatitis C Screening  Completed   Zoster Vaccines- Shingrix  Completed   HPV VACCINES  Aged Out    There are no preventive care reminders to display for this patient.  Lab Results  Component Value Date   TSH 0.96 01/31/2022   Lab Results  Component Value Date   WBC 6.6 05/16/2023   HGB 11.3 (L) 05/16/2023   HCT 34.7 (L) 05/16/2023   MCV 99.9 05/16/2023   PLT 326.0 05/16/2023   Lab Results  Component Value Date   NA 139 05/16/2023   K 4.4 05/16/2023   CO2 29 05/16/2023   GLUCOSE 110 (H) 05/16/2023   BUN 23 05/16/2023   CREATININE 0.75 05/16/2023   BILITOT 0.6 05/16/2023   ALKPHOS 45 05/16/2023   AST 21 05/16/2023   ALT 22 05/16/2023   PROT 6.5 05/16/2023   ALBUMIN 4.2 05/16/2023   CALCIUM 9.9 05/16/2023   ANIONGAP 9 02/18/2016   GFR 81.62 05/16/2023   Lab Results  Component Value Date   CHOL 365 (H) 11/09/2022   Lab Results  Component Value Date   HDL 71.40 11/09/2022   Lab Results  Component Value Date   LDLCALC 277 (H) 11/09/2022   Lab Results  Component Value Date   TRIG 81.0 11/09/2022   Lab Results  Component Value Date   CHOLHDL 5 11/09/2022   Lab Results  Component Value Date   HGBA1C 6.1 01/31/2022      Assessment & Plan:  Acute pansinusitis, recurrence not specified Assessment & Plan: Vital signs stable, patient in is no sign of acute distress, nontoxic.  Discussed findings with the patient.  Will treat with doxycycline, prednisone. Increase fluid intake, rest. She will let us know if symptoms not  improving   Orders: -     POCT Influenza A/B  Other orders -     Doxycycline Hyclate; Take 1 tablet (100 mg total) by mouth 2 (two) times daily for 7 days.  Dispense: 14 tablet; Refill: 0 -     predniSONE; Take 2 tablets (  40 mg total) by mouth daily with breakfast for 5 days.  Dispense: 10 tablet; Refill: 0    Follow-up: No follow-ups on file.   Kara Dies, NP

## 2023-09-24 ENCOUNTER — Ambulatory Visit: Payer: Medicare HMO | Admitting: Physical Therapy

## 2023-09-27 DIAGNOSIS — R29898 Other symptoms and signs involving the musculoskeletal system: Secondary | ICD-10-CM | POA: Diagnosis not present

## 2023-09-27 DIAGNOSIS — M25551 Pain in right hip: Secondary | ICD-10-CM | POA: Diagnosis not present

## 2023-09-27 DIAGNOSIS — R202 Paresthesia of skin: Secondary | ICD-10-CM | POA: Diagnosis not present

## 2023-09-27 DIAGNOSIS — G5603 Carpal tunnel syndrome, bilateral upper limbs: Secondary | ICD-10-CM | POA: Diagnosis not present

## 2023-09-27 DIAGNOSIS — R200 Anesthesia of skin: Secondary | ICD-10-CM | POA: Diagnosis not present

## 2023-09-27 DIAGNOSIS — G5622 Lesion of ulnar nerve, left upper limb: Secondary | ICD-10-CM | POA: Diagnosis not present

## 2023-09-27 DIAGNOSIS — G629 Polyneuropathy, unspecified: Secondary | ICD-10-CM | POA: Diagnosis not present

## 2023-09-27 DIAGNOSIS — N189 Chronic kidney disease, unspecified: Secondary | ICD-10-CM | POA: Diagnosis not present

## 2023-10-03 DIAGNOSIS — J0140 Acute pansinusitis, unspecified: Secondary | ICD-10-CM | POA: Insufficient documentation

## 2023-10-03 NOTE — Assessment & Plan Note (Signed)
Vital signs stable, patient in is no sign of acute distress, nontoxic.  Discussed findings with the patient.  Will treat with doxycycline, prednisone. Increase fluid intake, rest. She will let us know if symptoms not improving

## 2023-10-04 ENCOUNTER — Telehealth: Payer: Self-pay

## 2023-10-04 ENCOUNTER — Other Ambulatory Visit: Payer: Self-pay | Admitting: Internal Medicine

## 2023-10-04 NOTE — Telephone Encounter (Signed)
Copied from CRM [id number]. Topic: Clinical - Medication Refill >> Oct 04, 2023  2:35 PM Corin V wrote: Most Recent Primary Care Visit:  Provider: Kara Dies  Department: [facility]-  Visit Type: OFFICE VISIT  Date: 09/20/2023  Medication: ***  Has the patient contacted their pharmacy?  (Agent: If no, request that the patient contact the pharmacy for the refill. If patient does not wish to contact the pharmacy document the reason why and proceed with request.) (Agent: If yes, when and what did the pharmacy advise?)  Is this the correct pharmacy for this prescription?  If no, delete pharmacy and type the correct one.  This is the patient's preferred pharmacy:  [id number] [place], [place] - [address] [place] [address] Phone: [phone] Fax: [phone]   Has the prescription been filled recently?   Is the patient out of the medication?   Has the patient been seen for an appointment in the last year OR does the patient have an upcoming appointment?   Can we respond through MyChart?   Agent: Please be advised that Rx refills may take up to 3 business days. We ask that you follow-up with your pharmacy.

## 2023-10-04 NOTE — Telephone Encounter (Signed)
Copied from CRM [id number]. Topic: Clinical - Prescription Issue >> Oct 04, 2023  2:38 PM Corin V wrote: Reason for CRM: Patient calling to request refill on her oxyCODONE. She is requesting it be sent in with refills available. Epic resulted CRM instead of routing to clinic. Please reference CRM# [id number] for additional information.

## 2023-10-06 MED ORDER — OXYCODONE-ACETAMINOPHEN 5-325 MG PO TABS: 1.0000 | ORAL_TABLET | Freq: Four times a day (QID) | ORAL | 0 refills | Status: DC | PRN

## 2023-10-06 NOTE — Telephone Encounter (Signed)
[facility] for [facility]

## 2023-10-06 NOTE — Addendum Note (Signed)
Addended by: Sherlene Shams on: 10/06/2023 12:17 PM   Modules accepted: Orders

## 2023-10-08 NOTE — Telephone Encounter (Signed)
noted 

## 2023-10-11 ENCOUNTER — Other Ambulatory Visit: Payer: Self-pay

## 2023-10-11 ENCOUNTER — Telehealth: Payer: Self-pay | Admitting: Psychiatry

## 2023-10-11 DIAGNOSIS — F900 Attention-deficit hyperactivity disorder, predominantly inattentive type: Secondary | ICD-10-CM

## 2023-10-11 MED ORDER — LISDEXAMFETAMINE DIMESYLATE 50 MG PO CAPS: 50.0000 mg | ORAL_CAPSULE | Freq: Every day | ORAL | 0 refills | Status: DC

## 2023-10-11 NOTE — Telephone Encounter (Signed)
 Pended Vyvanse 50 mg to rqst pharmacy.

## 2023-10-11 NOTE — Telephone Encounter (Signed)
 Patient lvm at 11:38 requesting refill for Vyvanse 50mg . Ph: [phone] Appt 2/6 Pharmacy [facility] [address] [place], [place]

## 2023-10-16 ENCOUNTER — Encounter: Payer: Self-pay | Admitting: Physical Therapy

## 2023-10-16 ENCOUNTER — Ambulatory Visit: Payer: Medicare HMO | Attending: Orthopedic | Admitting: Physical Therapy

## 2023-10-16 DIAGNOSIS — M5459 Other low back pain: Secondary | ICD-10-CM | POA: Insufficient documentation

## 2023-10-16 DIAGNOSIS — R262 Difficulty in walking, not elsewhere classified: Secondary | ICD-10-CM | POA: Diagnosis not present

## 2023-10-16 IMAGING — MG MM DIGITAL DIAGNOSTIC UNILAT*L* W/ TOMO W/ CAD
6 series · 6 of 18 positions shown · non-contrast
Comparison: Previous exam(s).

CLINICAL DATA: 65-year-old female presenting as a recall from
screening for possible left breast asymmetry.

EXAM:
DIGITAL DIAGNOSTIC UNILATERAL LEFT MAMMOGRAM WITH TOMOSYNTHESIS AND
CAD
TECHNIQUE: Left digital diagnostic mammography and breast tomosynthesis was
performed. The images were evaluated with computer-aided detection.

[L CC synth-2D (1 of 2)]
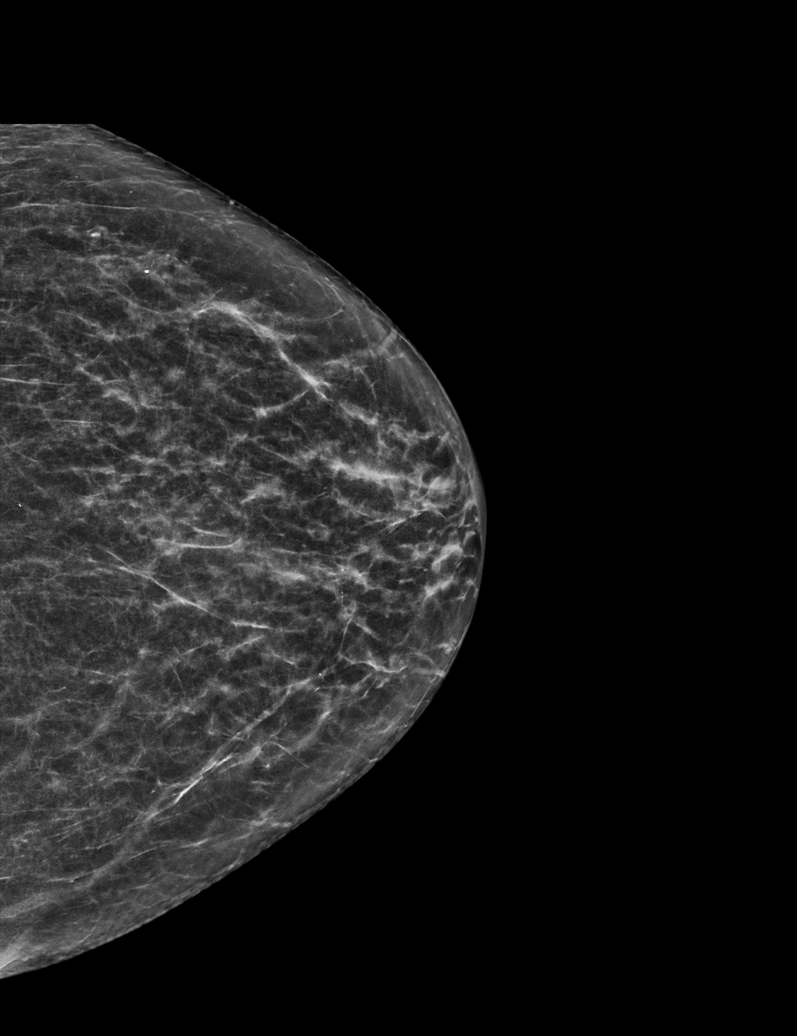

[L CC synth-2D (2 of 2)]
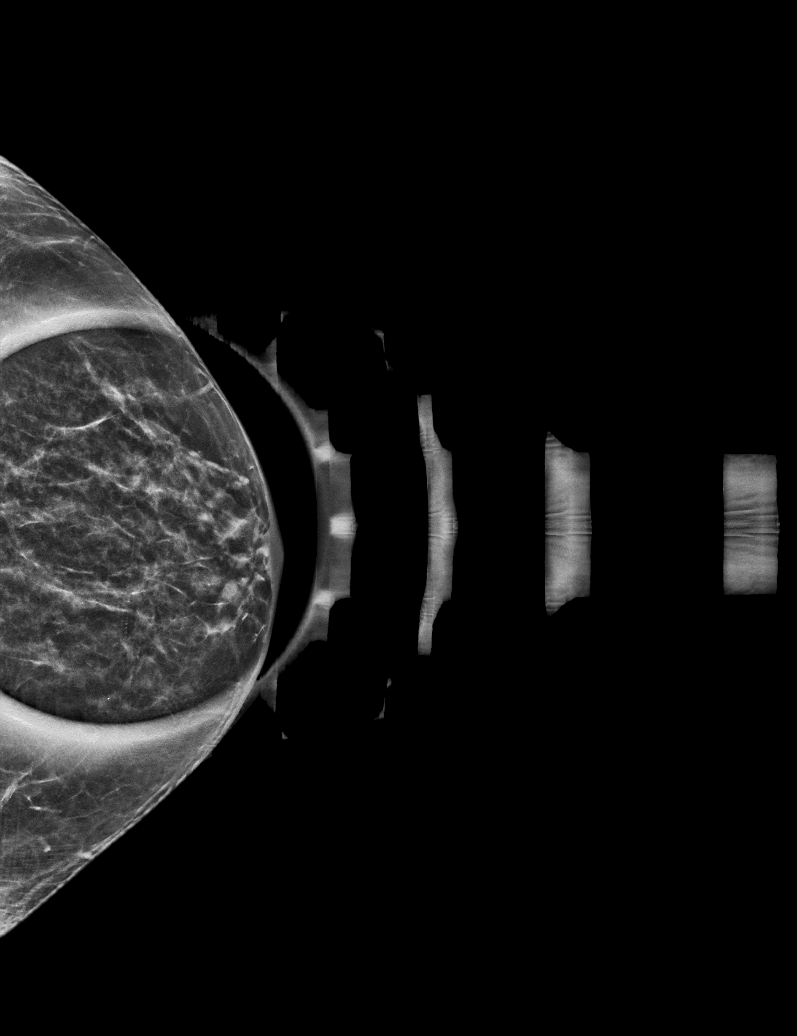

[L ML synth-2D]
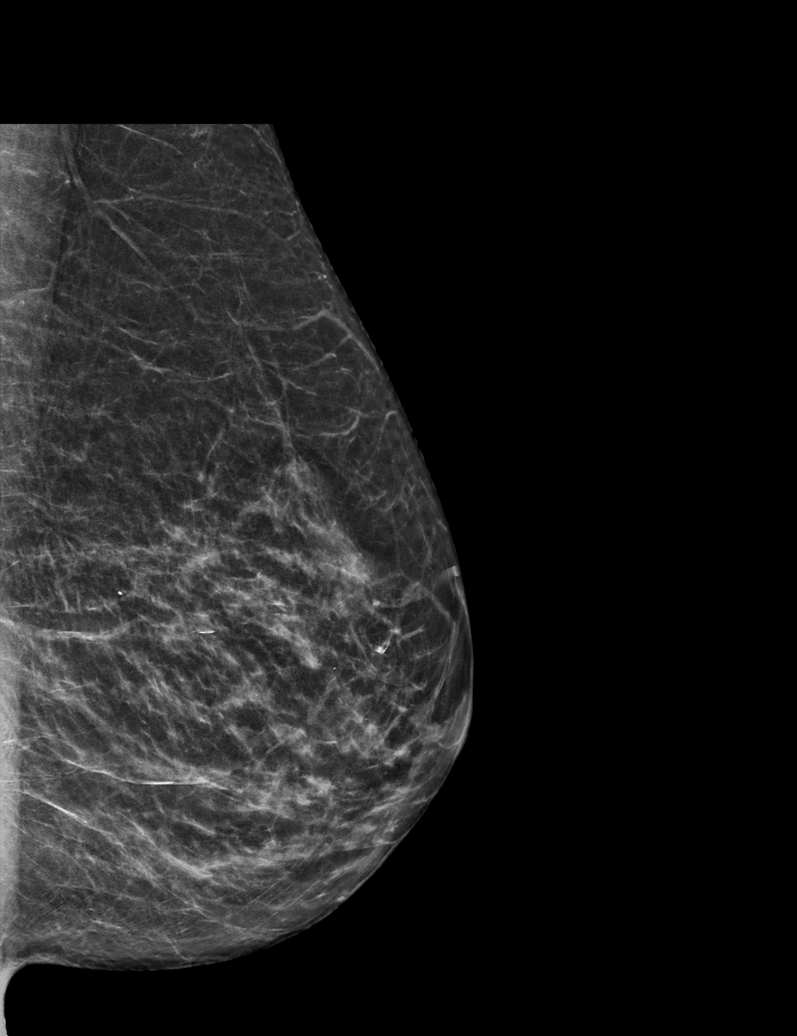

[L CC tomo (1 of 2) · tomo slice 23/44.0]
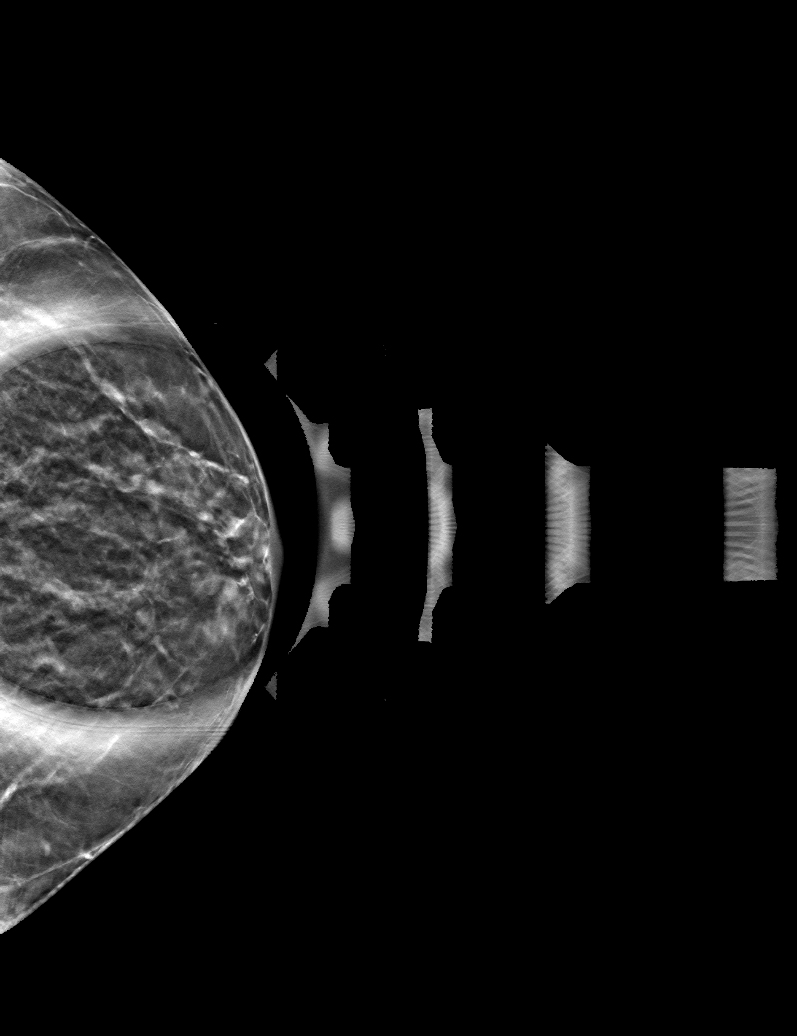

[L CC tomo (2 of 2) · tomo slice 27/54.0]
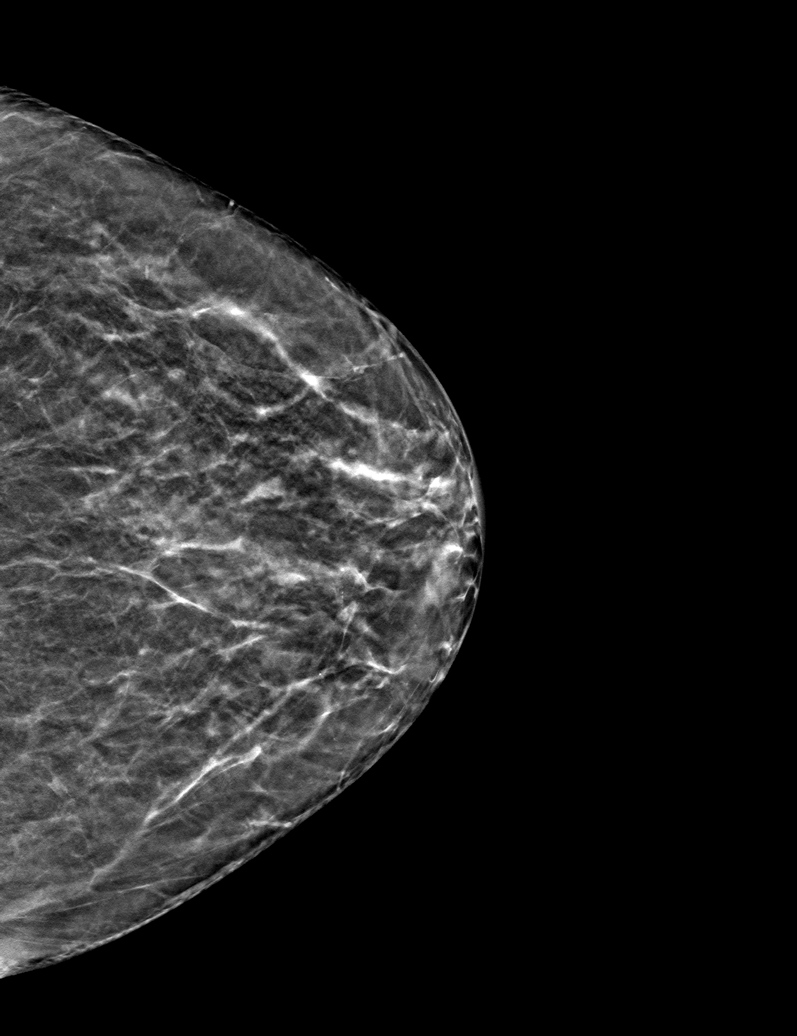

[L ML tomo · tomo slice 29/56.0]
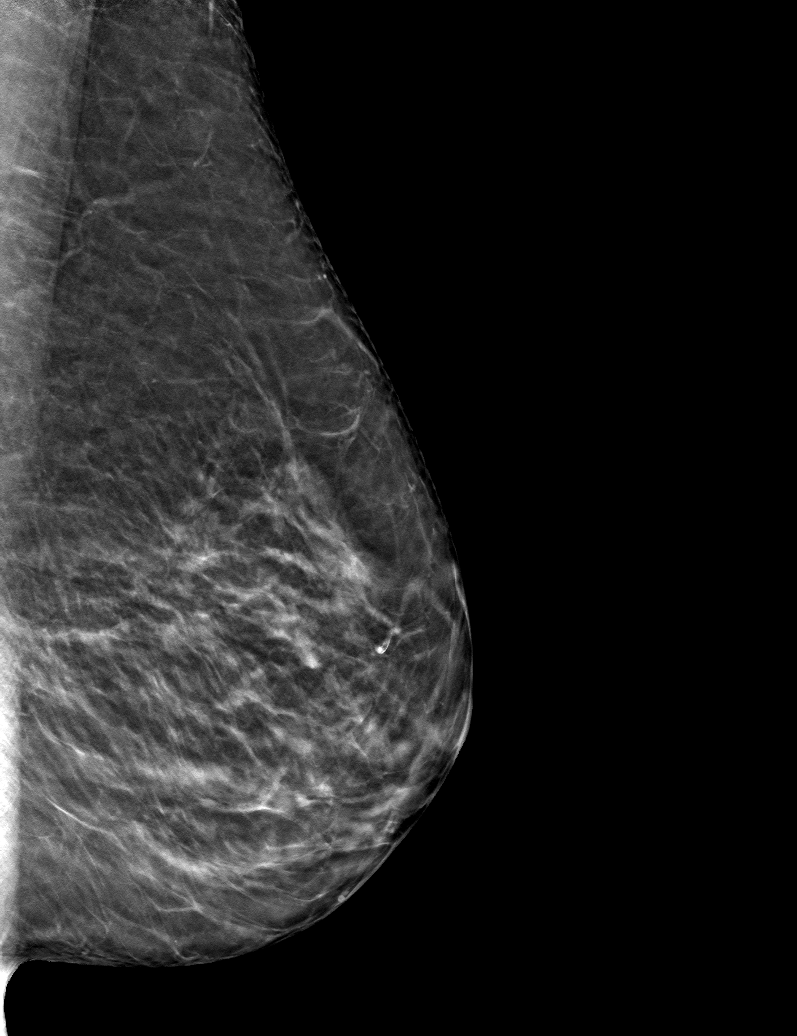

[6 of 18 positions shown; findings below may reference images not displayed]

ACR Breast Density Category b: There are scattered areas of
fibroglandular density.
FINDINGS: Full field cc and mL as well as spot compression tomosynthesis CC
views of the left breast were performed for a questioned asymmetry
seen only cc view in the outer anterior left breast. On the
additional imaging the asymmetry resolves and most likely
represented normal overlapping fibroglandular tissue. There is no
mass or distortion. There are no new findings elsewhere in the left
breast.
IMPRESSION: No mammographic evidence of malignancy in the left breast.

RECOMMENDATION:
Screening mammogram in one year.(Code:P1-9-UI5)

I have discussed the findings and recommendations with the patient.
If applicable, a reminder letter will be sent to the patient
regarding the next appointment.

BI-RADS CATEGORY  1: Negative.

## 2023-10-16 NOTE — Therapy (Signed)
 OUTPATIENT PHYSICAL THERAPY TREATMENT    Patient Name: Madeline Young MRN: [id number] DOB:Feb 15, 1956, 68 y.o., female Today's Date: 10/16/2023  END OF SESSION:  PT End of Session - 10/16/23 1123     Visit Number 19    Number of Visits 20    Date for PT Re-Evaluation 10/22/23    Authorization Type [employer] 2024 reporting period from  07/30/2023    Progress Note Due on Visit 10    PT Start Time 1122    PT Stop Time 1205    PT Time Calculation (min) 43 min    Equipment Utilized During Treatment Gait belt    Activity Tolerance Patient tolerated treatment well    Behavior During Therapy WFL for tasks assessed/performed                  Past Medical History:  Diagnosis Date   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   ADHD (attention deficit hyperactivity disorder)    Allergy    Anxiety    Arthritis    Cataract    bil cateracts removed   Complication of anesthesia    first colonoscopy pt states she woke up   Depression    GERD (gastroesophageal reflux disease)    Globus sensation    Hyperlipidemia    Incontinence    bowels at times   Insomnia    Multinodular goiter (nontoxic)    Psoriatic arthritis (HCC)    Spinal stenosis of lumbar region 03/10/2015   MRI    Statin intolerance 01/27/2013   Past Surgical History:  Procedure Laterality Date   BACK SURGERY  02/17/2016   Dr. Juliane- L4-L5 fusion    BIOPSY THYROID   2014   COLONOSCOPY     COLONOSCOPY W/ POLYPECTOMY     EYE SURGERY     bilateral cataract surgery w/ lens implant   FOOT SURGERY  2006   Right foot , secondary to severe loss of joint [facility])   POLYPECTOMY     TONSILLECTOMY     UPPER GASTROINTESTINAL ENDOSCOPY  2005   With empiric esophageal dilation   Patient Active Problem List   Diagnosis Date Noted   Acute pansinusitis 10/03/2023   Unintentional Tylenol  overdose, sequela 08/14/2023   Chronic pain not due to malignancy 08/14/2023   Slac (scapholunate advanced collapse) of wrist, left  06/03/2023   Mass of abdomen 05/23/2023   Urge incontinence 05/16/2022   Onychomycosis 08/17/2021   Drug allergy 06/29/2021   Fibromyalgia 06/29/2021   Generalized osteoarthritis 06/29/2021   Hand joint pain 06/29/2021   Other intervertebral disc degeneration, lumbar region 06/29/2021   Other long term (current) drug therapy 06/29/2021   Sciatica 01/28/2020   Rhinitis, non-allergic 11/10/2019   Insomnia 08/10/2019   Anxiety 10/11/2018   Carotid artery stenosis, asymptomatic, bilateral 10/29/2016   ADD (attention deficit disorder) 10/29/2016   Spondylolisthesis of lumbar region 02/17/2016   Globus sensation since at least 2005 04/29/2015   GERD (gastroesophageal reflux disease) 11/17/2014   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   Travel advice encounter 01/27/2013   Multinodular goiter (nontoxic) 08/11/2012   Overweight (BMI 25.0-29.9) 08/11/2012   Psoriatic arthritis, destructive type (HCC)    Screening for cervical cancer 08/21/2011   Screening for breast cancer 08/21/2011   Hx of adenomatous polyp of colon 10/06/2008   Familial hyperlipidemia 09/15/2008   Major depressive disorder, single episode, in remission (HCC) 09/15/2008   ALLERGIC RHINITIS 09/15/2008    PCP: Dr. Verneita Kettering   REFERRING PROVIDER: Garnette Hamilton NP  REFERRING DIAG: Spinal stenosis of lumbar region  Rationale for Evaluation and Treatment: Rehabilitation  THERAPY DIAG:  Other low back pain  Difficulty in walking, not elsewhere classified  ONSET DATE: 2017   PERTINENT HISTORY:  Pt reports that she has three different types of arthritis: osteo, rheumatoid, and psoriatic. She had lumbar fusion in 2017 which she felt did not help significantly. She had an infusion for her psoriatic arthritis this past spring and it caused her low back pain. She recently had a spinal injection and this resolved radiating pain on her LLE, but she still has excruciating, radiating pain down her right lower extremity. She feels  unsteady most of the time and she has to use walker to keep from falling. Pt has also been told that she needs a reverse total shoulder because right shoulder RTC damage.   SUBJECTIVE:                                                                                                                                                                                           SUBJECTIVE STATEMENT: Patient states she has been doing pretty good since last PT session, except that she had a terrible fall over Christmas where she hit the right ribs hard. She has had pain there since that is worst when she pulls hard with the right UE or reaches overhead with the right UE. She has not been able to do all of her HEP due to this pain. She states it is getting better but still bothers her. She has been very busy and active over the weekend. She has been having pain and numbness in her left groin and hip. She has been having  pain in her riht foot and it has been literally blue. She has some redness in her right lower leg and a sore that keeps opening up on both legs in the same spot. She thinks her doctor needs to send her to a cardiologist or something because she thinks something venous is going on.   PAIN:  Are you having pain? 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   1 RM TESTING (calculated  from XRM 10 or less):  Seated leg press, seat position 1, single leg (last tested 10/16/2023);  R: 39# L: 57#  TODAY'S TREATMENT:                                                         Therapeutic exercise: to centralize symptoms and improve ROM, strength, muscular endurance, and activity tolerance required for successful completion of functional activities.  NuStep level 5 using bilateral upper and lower extremities. Seat/handle setting 10/12. For improved extremity mobility, muscular endurance, and activity tolerance; and to induce the analgesic effect of aerobic exercise, stimulate improved joint nutrition, and prepare body structures and systems for following interventions. x 7  minutes. Average SPM = 60.  seated (on clear tharaball) single arm row:  1x20 R side at 5# 2x20 each side at 15#  Seated leg press, seat position 1 1x20 each side at 20# 1 RM testing:  R: 1x10 at 25# (unable to move higher weighs, spaced by 10#) L: 1x10 at 25# R: 1x9 (plus one partial) at 30# (started from extended position, max ability) L: 1x8 at 45# (started from extended position, max ability)  Neuromuscular Re-education: to improve, balance, postural strength, muscle activation patterns, and stabilization strength required for functional activities:  Seated (on clear theraball), march with hold: x5 each side with 5 second hold  (challenging)  Seated on clear theraball SA row with ipsi foot off floor 10# cable 2x10 sets each side to fatigue with SBA-min assist  Pt required multimodal cuing for proper technique and to facilitate improved neuromuscular control, strength, range of motion, and functional ability resulting in improved performance and form.  PATIENT EDUCATION:  Education details: form and technique for correct performance of exercise.  Person educated: Patient Education method: Explanation, Demonstration, Verbal cues, and Handouts Education comprehension: verbalized understanding, returned  demonstration, and verbal cues required  HOME EXERCISE PROGRAM: Access Code: [email or web address] URL: [email or web address] Date: 08/16/2023 Prepared by: Camie Cleverly  Exercises - Sit to Stand  - 3-4 x weekly - 2 sets - 7-10 reps - Walking with Head Nod  - 3-4 x weekly - 3 sets - 10 reps - Standing March with Counter Support  - 3-4 x weekly - 3 sets - 10 reps - Standing Toe Taps  - 3-4 x weekly - 3 sets - 10 reps - Seated Single Arm Shoulder Row with Anchored Resistance  - 3-7 x weekly - 2-3 sets - 15 reps - 2 seconds hold  HOME EXERCISE PROGRAM [[email or web address]] View at [email or web address] using code: [email or web address] Supine Marching (phase 2) - 'up up, down down' -  Repeat 10 Repetitions, Complete 2 Sets, Perform 2 Times a Day  SCIATIC NERVE GLIDE -  Repeat 15 Repetitions, Complete 1 Set, Perform 2    ASSESSMENT:  CLINICAL IMPRESSION: Patient returns to PT after slightly over 4 weeks during the holidays and due to scheduling limitations. She was able to return to previous exercises with good retention of motor control but more limitations at the right shoulder after falling and injuring her right torso (improving since fall). Today's session focused on continued trunk stabilization, strength, and motor control exercises for improved active support of her lumbar  spine. It also included return to seated single leg press to improve LE strength in a tolerated position to improve functional activity tolerance and decrease fall risk. 1 RM testing/calculation for single leg press and found to be quite unequal between sides. Her calculated 1RM for the right was 39# and left was 57#, highlighting asymmetry in her strength and function. Plan to continue working on LE strength, active lumbar support, and functional strengthening/balance as tolerated. Patient would benefit from continued management of limiting condition by skilled physical therapist to address remaining impairments and functional limitations to  work towards stated goals and return to PLOF or maximal functional independence.   OBJECTIVE IMPAIRMENTS: Abnormal gait, decreased balance, decreased endurance, decreased knowledge of use of DME, decreased mobility, difficulty walking, decreased ROM, decreased strength, hypomobility, impaired flexibility, impaired sensation, impaired UE functional use, and pain.   ACTIVITY LIMITATIONS: carrying, lifting, bending, standing, squatting, sleeping, stairs, bed mobility, bathing, toileting, dressing, reach over head, hygiene/grooming, and locomotion level  PARTICIPATION LIMITATIONS: cleaning, laundry, driving, shopping, community activity, and yard work  PERSONAL FACTORS: Past/current experiences, Time since onset of injury/illness/exacerbation, and 3+ comorbidities: psoriatic and rheumatoid arthritis,   are also affecting patient's functional outcome.   REHAB POTENTIAL: Fair chronicity of low back and multiple co-morbidities  CLINICAL DECISION MAKING: Evolving/moderate complexity  EVALUATION COMPLEXITY: Moderate   GOALS: Goals reviewed with patient? No  SHORT TERM GOALS: Target date: 06/05/2023  Pt will be independent with HEP in order to improve strength and balance in order to decrease fall risk and improve function at home and work. Baseline: NT 06/05/23: Able to perform independently  Goal status: Achieved   2.  Pt will increase by at least 0.13 m/s in order to demonstrate clinically significant improvement in community ambulation.  Baseline:  0.81 m/sec; 0.82 m/s (07/30/2023);  Goal status: ONGOING   3.  Pt will increase by at least 76m (149ft) in order to demonstrate clinically significant improvement in cardiopulmonary endurance and community ambulation Baseline:  800 ft; 805 feet with SPC (07/30/2023);  Goal status: Ongoing   4.  Patient will show correct use of single point cane and rollator for improved ambulation and to decrease risk of falling.  Baseline: NT  06/04/23: Using SPC  Goal status: ACHIEVED    LONG TERM GOALS: Target date: 07/31/2023. Target date updated to 10/22/2023 for all unmet goals on 07/30/2023.   Patient will have improved function and activity level as evidenced by an increase in FOTO score by 10 points or more.  Baseline: 40/100 with target of 47: 39 at visit #10 (07/30/2023);  Goal status: ONGOING   2.  Patient will ambulate >=1000 ft for as evidence of improved aerobic endurance and lumbar function.  Baseline: 800 ft; 805 feet with SPC (07/30/2023);  Goal status: ONGOING   3. Patient will perform 10 meter walk test in >=1 m/sec for improved mobility and to decrease her risk of falling. Baseline: 0.81 m/sec; 0.82 m/s (07/30/2023);  Goal status: ONGOING   4.  Patient will demonstrate an improvement in DGI score of >=2 pts as evidence of improved dynamic balance to decrease risk of falling (Pardasaney et al, 2012). Baseline: 14/24; 15/24 (07/30/2023);  Goal status: ONGOING   PLAN:  PT FREQUENCY: 1-2x/week  PT DURATION: 12 weeks  PLANNED INTERVENTIONS: Therapeutic exercises, Therapeutic activity, Neuromuscular re-education, Balance training, Gait training, Patient/Family education, Self Care, Joint mobilization, Joint manipulation, Stair training, Vestibular training, Canalith repositioning, DME instructions, Aquatic Therapy, Dry Needling, Electrical stimulation, Spinal  manipulation, Spinal mobilization, Cryotherapy, Moist heat, Traction, Ultrasound, Manual therapy, and Re-evaluation.  PLAN FOR NEXT SESSION: update HEP as appropriate, progressive LE/trunk/functional strengthening, motor control, and dynamic balance exercises.   Camie SAUNDERS. Juli, PT, DPT, Cert. MDT 10/16/23, 11:24 AM  [facility] [address] [place], [place] [address] P: [phone] I F: [phone]

## 2023-10-18 ENCOUNTER — Ambulatory Visit: Payer: Medicare HMO | Admitting: Physical Therapy

## 2023-10-18 ENCOUNTER — Encounter: Payer: Self-pay | Admitting: Physical Therapy

## 2023-10-18 DIAGNOSIS — M5459 Other low back pain: Secondary | ICD-10-CM | POA: Diagnosis not present

## 2023-10-18 DIAGNOSIS — R262 Difficulty in walking, not elsewhere classified: Secondary | ICD-10-CM

## 2023-10-18 NOTE — Therapy (Signed)
 OUTPATIENT PHYSICAL THERAPY TREATMENT / PROGRESS NOTE / RE-CERTIFICATION Dates of reporting from 07/30/2023 to 10/18/2023   Patient Name: Madeline Young MRN: [id number] DOB:06/15/1956, 68 y.o., female Today's Date: 10/18/2023  END OF SESSION:  PT End of Session - 10/18/23 1123     Visit Number 20    Number of Visits 20    Date for PT Re-Evaluation 12/27/23    Authorization Type [employer] 2024 reporting period from  07/30/2023    Progress Note Due on Visit 10    PT Start Time 1120    PT Stop Time 1200    PT Time Calculation (min) 40 min    Equipment Utilized During Treatment Gait belt    Activity Tolerance Patient tolerated treatment well    Behavior During Therapy WFL for tasks assessed/performed              Past Medical History:  Diagnosis Date   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   ADHD (attention deficit hyperactivity disorder)    Allergy    Anxiety    Arthritis    Cataract    bil cateracts removed   Complication of anesthesia    first colonoscopy pt states she woke up   Depression    GERD (gastroesophageal reflux disease)    Globus sensation    Hyperlipidemia    Incontinence    bowels at times   Insomnia    Multinodular goiter (nontoxic)    Psoriatic arthritis (HCC)    Spinal stenosis of lumbar region 03/10/2015   MRI    Statin intolerance 01/27/2013   Past Surgical History:  Procedure Laterality Date   BACK SURGERY  02/17/2016   Dr. Juliane- L4-L5 fusion    BIOPSY THYROID   2014   COLONOSCOPY     COLONOSCOPY W/ POLYPECTOMY     EYE SURGERY     bilateral cataract surgery w/ lens implant   FOOT SURGERY  2006   Right foot , secondary to severe loss of joint [facility])   POLYPECTOMY     TONSILLECTOMY     UPPER GASTROINTESTINAL ENDOSCOPY  2005   With empiric esophageal dilation   Patient Active Problem List   Diagnosis Date Noted   Acute pansinusitis 10/03/2023   Unintentional Tylenol  overdose, sequela 08/14/2023   Chronic pain not due to  malignancy 08/14/2023   Slac (scapholunate advanced collapse) of wrist, left 06/03/2023   Mass of abdomen 05/23/2023   Urge incontinence 05/16/2022   Onychomycosis 08/17/2021   Drug allergy 06/29/2021   Fibromyalgia 06/29/2021   Generalized osteoarthritis 06/29/2021   Hand joint pain 06/29/2021   Other intervertebral disc degeneration, lumbar region 06/29/2021   Other long term (current) drug therapy 06/29/2021   Sciatica 01/28/2020   Rhinitis, non-allergic 11/10/2019   Insomnia 08/10/2019   Anxiety 10/11/2018   Carotid artery stenosis, asymptomatic, bilateral 10/29/2016   ADD (attention deficit disorder) 10/29/2016   Spondylolisthesis of lumbar region 02/17/2016   Globus sensation since at least 2005 04/29/2015   GERD (gastroesophageal reflux disease) 11/17/2014   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   Travel advice encounter 01/27/2013   Multinodular goiter (nontoxic) 08/11/2012   Overweight (BMI 25.0-29.9) 08/11/2012   Psoriatic arthritis, destructive type (HCC)    Screening for cervical cancer 08/21/2011   Screening for breast cancer 08/21/2011   Hx of adenomatous polyp of colon 10/06/2008   Familial hyperlipidemia 09/15/2008   Major depressive disorder, single episode, in remission (HCC) 09/15/2008   ALLERGIC RHINITIS 09/15/2008    PCP: Dr. Verneita Kettering  REFERRING PROVIDER: Garnette Hamilton NP   REFERRING DIAG: Spinal stenosis of lumbar region  Rationale for Evaluation and Treatment: Rehabilitation  THERAPY DIAG:  Other low back pain  Difficulty in walking, not elsewhere classified  ONSET DATE: 2017   PERTINENT HISTORY:  Pt reports that she has three different types of arthritis: osteo, rheumatoid, and psoriatic. She had lumbar fusion in 2017 which she felt did not help significantly. She had an infusion for her psoriatic arthritis this past spring and it caused her low back pain. She recently had a spinal injection and this resolved radiating pain on her LLE, but she still  has excruciating, radiating pain down her right lower extremity. She feels unsteady most of the time and she has to use walker to keep from falling. Pt has also been told that she needs a reverse total shoulder because right shoulder RTC damage.   SUBJECTIVE:                                                                                                                                                                                           SUBJECTIVE STATEMENT: Patient states she was pretty sore yesterday, but mostly in her muscles. She is getting ready for her kids to come visit this weekend for the [facility]. She got her lightweight rollator and it has been working good for her in the car. She likes the larger rollator better at home.   Patient is interested in going to a chair yoga class.   PAIN:  Are you having pain? Yes  NPRS: 3-4/10 at lower abdomen, 5/10 right shoulder and right trunk.   PRECAUTIONS: None  PATIENT GOALS: To improve balance and to decrease   OBJECTIVE  SELF-REPORTED FUNCTION FOTO score: 48/100 (lumbar questionnaire)  1 RM TESTING (calculated from 10RM or less):  Seated leg press, seat position 1, single leg (last tested 10/16/2023);  R: 39# L: 57#  FUNCTIONAL/BALANCE TESTS 6 Minute Walk Test: 831 feet with SPC in right UE used, correctly.    10 Meter Walk Test: 0.86 m/s   [facility] PT Assessment - 10/18/23 0001       Dynamic Gait Index   Level Surface Mild Impairment   self selected pace: 7.90 seconds; fast pace: 7 seconds; uses SPC throughout   Change in Gait Speed Mild Impairment    Gait with Horizontal Head Turns Mild Impairment    Gait with Vertical Head Turns Mild Impairment    Gait and Pivot Turn Normal    Step Over Obstacle Mild Impairment    Step Around Obstacles Normal    Steps Moderate Impairment    Total Score  17    DGI comment: Scores of 19 or less are predictive of falls in older community living adults              TODAY'S  TREATMENT:                                                         Therapeutic exercise: to centralize symptoms and improve ROM, strength, muscular endurance, and activity tolerance required for successful completion of functional activities.  NuStep level 5 using bilateral upper and lower extremities. Seat/handle setting 10/12. For improved extremity mobility, muscular endurance, and activity tolerance; and to induce the analgesic effect of aerobic exercise, stimulate improved joint nutrition, and prepare body structures and systems for following interventions. x 7  minutes. Average SPM = 65.  6 Minute Walk Test to assess progress (see above)  Seated leg press, seat position 1 R: 1x20 at 25# (~60% 1RM) L: 1x20 at 35# (~60% 1RM)  Neuromuscular Re-education: to improve, balance, postural strength, muscle activation patterns, and stabilization strength required for functional activities:  Dynamic Gait Index to assess progress (see above)  10 Meter Walking Trial to assess progress (see above)  Pt required multimodal cuing for proper technique and to facilitate improved neuromuscular control, strength, range of motion, and functional ability resulting in improved performance and form.  PATIENT EDUCATION:  Education details: form and technique for correct performance of exercise.  Person educated: Patient Education method: Explanation, Demonstration, Verbal cues, and Handouts Education comprehension: verbalized understanding, returned demonstration, and verbal cues required  HOME EXERCISE PROGRAM: Access Code: [email or web address] URL: [email or web address] Date: 08/16/2023 Prepared by: Camie Cleverly  Exercises - Sit to Stand  - 3-4 x weekly - 2 sets - 7-10 reps - Walking with Head Nod  - 3-4 x weekly - 3 sets - 10 reps - Standing March with Counter Support  - 3-4 x weekly - 3 sets - 10 reps - Standing Toe Taps  - 3-4 x weekly - 3 sets - 10 reps - Seated Single Arm Shoulder Row with  Anchored Resistance  - 3-7 x weekly - 2-3 sets - 15 reps - 2 seconds hold  HOME EXERCISE PROGRAM [[id number]] View at [email or web address] using code: [email or web address] Supine Marching (phase 2) - 'up up, down down' -  Repeat 10 Repetitions, Complete 2 Sets, Perform 2 Times a Day  SCIATIC NERVE GLIDE -  Repeat 15 Repetitions, Complete 1 Set, Perform 2    ASSESSMENT:  CLINICAL IMPRESSION: Patient has attended 20 physical therapy PT sessions since starting current episode of care on 05/21/2023. She was unable attend for just over 4 weeks during December/holidays and just returned to PT earlier this week. She has met her FOTO goal, and demonstrates mild improvements in dynamic gait index (DGI), walking speed, and 6 Minute Walk Test. She still scores in the increased fall risk category of DGI and continues to experience falls at home (although rate is decreased). During today's walking test she did not have the same feeling of weakness in her back and legs that usually limits her. Her results today are likely affected by lack of attendance for the last 4 weeks and she would benefit from continued PT to maximize function and safety. Plan to continue for 10 more visits before re-assessing readiness for discharge. Patient would benefit from continued management of  limiting condition by skilled physical therapist to address remaining impairments and functional limitations to work towards stated goals and return to PLOF or maximal functional independence.    OBJECTIVE IMPAIRMENTS: Abnormal gait, decreased balance, decreased endurance, decreased knowledge of use of DME, decreased mobility, difficulty walking, decreased ROM, decreased strength, hypomobility, impaired flexibility, impaired sensation, impaired UE functional use, and pain.   ACTIVITY LIMITATIONS: carrying, lifting, bending, standing, squatting, sleeping, stairs, bed mobility, bathing, toileting, dressing, reach over head, hygiene/grooming, and locomotion  level  PARTICIPATION LIMITATIONS: cleaning, laundry, driving, shopping, community activity, and yard work  PERSONAL FACTORS: Past/current experiences, Time since onset of injury/illness/exacerbation, and 3+ comorbidities: psoriatic and rheumatoid arthritis,   are also affecting patient's functional outcome.   REHAB POTENTIAL: Fair chronicity of low back and multiple co-morbidities  CLINICAL DECISION MAKING: Evolving/moderate complexity  EVALUATION COMPLEXITY: Moderate   GOALS: Goals reviewed with patient? No  SHORT TERM GOALS: Target date: 06/05/2023  Pt will be independent with HEP in order to improve strength and balance in order to decrease fall risk and improve function at home and work. Baseline: NT 06/05/23: Able to perform independently  Goal status: Achieved   2.  Pt will increase by at least 0.13 m/s in order to demonstrate clinically significant improvement in community ambulation.  Baseline:  0.81 m/sec; 0.82 m/s (07/30/2023); 0.86 m/s (10/18/2023); Goal status: in progress  3.  Pt will increase by at least 63m (177ft) in order to demonstrate clinically significant improvement in cardiopulmonary endurance and community ambulation Baseline:  800 ft; 805 feet with SPC (07/30/2023); 831 feet with SPC (10/17/2022); Goal status: In progress  4.  Patient will show correct use of single point cane and rollator for improved ambulation and to decrease risk of falling.  Baseline: NT 06/04/23: Using SPC  Goal status: ACHIEVED    LONG TERM GOALS: Target date: 07/31/2023. Target date updated to 10/22/2023 for all unmet goals on 07/30/2023. Target date updated to 12/27/2023 for all unmet goals on 10/18/2023  Patient will have improved function and activity level as evidenced by an increase in FOTO score by 10 points or more.  Baseline: 40/100 with target of 47: 39 at visit #10 (07/30/2023); 48 at visit #20 (10/18/23); Goal status: MET  2.  Patient will ambulate >=1000 ft for  as evidence of improved aerobic endurance and lumbar function.  Baseline: 800 ft; 805 feet with SPC (07/30/2023); 831 feet with SPC (10/17/2022); Goal status: In-progress  3. Patient will perform 10 meter walk test in >=1 m/sec for improved mobility and to decrease her risk of falling. Baseline: 0.81 m/sec; 0.82 m/s (07/30/2023); 0.86 m/s (10/18/2023);  Goal status: In progress  4.  Patient will demonstrate an improvement in DGI score of >=2 pts as evidence of improved dynamic balance to decrease risk of falling (Pardasaney et al, 2012). Baseline: 14/24; 15/24 (07/30/2023); 17/24 (10/18/2023); Goal status: MET   PLAN:  PT FREQUENCY: 1-2x/week  PT DURATION: 10 weeks  PLANNED INTERVENTIONS: Therapeutic exercises, Therapeutic activity, Neuromuscular re-education, Balance training, Gait training, Patient/Family education, Self Care, Joint mobilization, Joint manipulation, Stair training, Vestibular training, Canalith repositioning, DME instructions, Aquatic Therapy, Dry Needling, Electrical stimulation, Spinal manipulation, Spinal mobilization, Cryotherapy, Moist heat, Traction, Ultrasound, Manual therapy, and Re-evaluation.  PLAN FOR NEXT SESSION: update HEP as appropriate, progressive LE/trunk/functional strengthening, motor control, and dynamic balance exercises.   Camie SAUNDERS. Juli, PT, DPT, Cert. MDT 10/18/23, 8:06 PM  [facility] [address] [place], [place]  [address] P: [phone] I F: [phone]

## 2023-10-23 ENCOUNTER — Ambulatory Visit: Payer: Medicare HMO | Admitting: Physical Therapy

## 2023-10-25 ENCOUNTER — Encounter: Payer: Medicare HMO | Admitting: Physical Therapy

## 2023-10-25 ENCOUNTER — Ambulatory Visit: Payer: Self-pay | Admitting: Internal Medicine

## 2023-10-25 NOTE — Telephone Encounter (Signed)
  Chief Complaint: Sore throat Symptoms: pain Frequency: several weeks Pertinent Negatives: Patient denies drooling, inability to clear secretions Disposition: [] ED /[] Urgent Care (no appt availability in office) / [x] Appointment(In office/virtual)/ []   Virtual Care/ [] Home Care/ [] Refused Recommended Disposition /[]  Mobile Bus/ []  Follow-up with PCP Additional Notes: Patient called reporting sore throat for several weeks. States it is difficult to swallow and causes 9/10 pain. States she is clearing green phlegm from throat. States throat is red, but unsure if swollen. States she has mild fever at 99.17F. Denies inability to clear secretions, drooling. Per protocol, patient to be evaluated within 24 hours. Scheduled with first available provider at patient request. Care advice reviewed with patient, understanding verbalized. Denies further questions at this time. Alerting PCP for review.    Copied from CRM [id number]. Topic: Clinical - Red Word Triage >> Oct 25, 2023  2:19 PM Fredrica W wrote: Red Word that prompted transfer to Nurse Triage: severe sore throat unable to eat difficulty swallowing Reason for Disposition  SEVERE (e.g., excruciating) throat pain  Answer Assessment - Initial Assessment Questions 1. ONSET: "When did the throat start hurting?" (Hours or days ago)      Several weeks 2. SEVERITY: "How bad is the sore throat?" (Scale 1-10; mild, moderate or severe)   - MILD (1-3):  Doesn't interfere with eating or normal activities.   - MODERATE (4-7): Interferes with eating some solids and normal activities.   - SEVERE (8-10):  Excruciating pain, interferes with most normal activities.   - SEVERE WITH DYSPHAGIA (10): Can't swallow liquids, drooling.     9/10 3. STREP EXPOSURE: "Has there been any exposure to strep within the past week?" If Yes, ask: "What type of contact occurred?"      Denies 4.  VIRAL SYMPTOMS: "Are there any symptoms of a cold, such as a runny  nose, cough, hoarse voice or red eyes?"      Runny nose 5. FEVER: "Do you have a fever?" If Yes, ask: "What is your temperature, how was it measured, and when did it start?"     Mild fever- 99.17F 6. PUS ON THE TONSILS: "Is there pus on the tonsils in the back of your throat?"     Red, unsure if there is pus on tonsils 7. OTHER SYMPTOMS: "Do you have any other symptoms?" (e.g., difficulty breathing, headache, rash)     Productive green phlegm  Protocols used: Sore Throat-A-AH

## 2023-10-26 ENCOUNTER — Ambulatory Visit (INDEPENDENT_AMBULATORY_CARE_PROVIDER_SITE_OTHER): Payer: Medicare HMO | Admitting: Family Medicine

## 2023-10-26 ENCOUNTER — Encounter: Payer: Self-pay | Admitting: Family Medicine

## 2023-10-26 VITALS — BP 128/72 | HR 86 | Ht 65.0 in | Wt 146.2 lb

## 2023-10-26 DIAGNOSIS — J029 Acute pharyngitis, unspecified: Secondary | ICD-10-CM | POA: Diagnosis not present

## 2023-10-26 DIAGNOSIS — L400 Psoriasis vulgaris: Secondary | ICD-10-CM | POA: Insufficient documentation

## 2023-10-26 DIAGNOSIS — L409 Psoriasis, unspecified: Secondary | ICD-10-CM | POA: Insufficient documentation

## 2023-10-26 LAB — POCT RAPID STREP A (OFFICE): Rapid Strep A Screen: NEGATIVE

## 2023-10-26 MED ORDER — NYSTATIN 100000 UNIT/ML MT SUSP: 5.0000 mL | Freq: Four times a day (QID) | OROMUCOSAL | 0 refills | Status: AC

## 2023-10-26 NOTE — Assessment & Plan Note (Signed)
Chronic issue.  Encouraged patient to keep the areas covered as she is able to.  Discussed minimizing how long her hands get wet for.  She will continue to see dermatology.

## 2023-10-26 NOTE — Progress Notes (Signed)
Marikay Alar, MD Phone: [phone]  Madeline Young is a 68 y.o. female who presents today for same day visit.   Sore throat: Patient notes this has been going on a couple of weeks.  No cough or congestion.  Notes low-grade temperatures.  Notes no rhinorrhea.  Notes she has been mouth breathing she has been sleeping on her back.  Notes she has been tapering down on prednisone which she has been on chronically.  Patient also notes she has brought up some discolored mucus.  Psoriasis: Patient notes history of psoriasis.  Has dry skin on her hands and notes that her fingertips crack at times.  She tries to wear gloves when she is washing dishes.  She notes she has discussed this with her dermatologist and they advised her to keep the areas covered.  Social History   Tobacco Use  Smoking Status Former   Current packs/day: 0.00   Types: Cigarettes   Quit date: 08/21/1971   Years since quitting: 52.2   Passive exposure: Never  Smokeless Tobacco Never    Current Outpatient Medications on File Prior to Visit  Medication Sig Dispense Refill   acetaminophen (TYLENOL) 500 MG tablet Take 1,000 mg by mouth every 6 (six) hours as needed.     alendronate (FOSAMAX) 70 MG tablet Take 70 mg by mouth once a week.     aspirin 81 MG tablet Take 81 mg by mouth daily.     b complex vitamins capsule Take 1 capsule by mouth daily.     calcium carbonate (OS-CAL - DOSED IN MG OF ELEMENTAL CALCIUM) 1250 (500 Ca) MG tablet      celecoxib (CELEBREX) 200 MG capsule TAKE 1 CAPSULE BY MOUTH TWICE A DAY 180 capsule 1   cholecalciferol (VITAMIN D) 1000 UNITS tablet Take 2,000 Units by mouth daily.     clobetasol (TEMOVATE) 0.05 % external solution as needed.     diclofenac Sodium (VOLTAREN) 1 % GEL APPLY 2 GRAMS TO AFFECTED AREA 4 TIMES A DAY 300 g 3   DULoxetine (CYMBALTA) 60 MG capsule Take 2 capsules (120 mg total) by mouth daily. 180 capsule 0   fluocinonide ointment (LIDEX) 0.05 % APPLY TWICE DAILY TO  AFFECTED AREAS UNTIL IMPROVED THEN AS NEEDED FOR FLARES     furosemide (LASIX) 20 MG tablet TAKE 1 TABLET BY MOUTH EVERY DAY AS NEEDED 90 tablet 0   ipratropium (ATROVENT) 0.03 % nasal spray Place into both nostrils.     leflunomide (ARAVA) 20 MG tablet Take 20 mg by mouth daily.     lisdexamfetamine (VYVANSE) 50 MG capsule Take 1 capsule (50 mg total) by mouth daily. 30 capsule 0   Magnesium 250 MG CAPS Take by mouth. Take two by mouth daily     melatonin 5 MG TABS Take 5 mg by mouth at bedtime.     modafinil (PROVIGIL) 200 MG tablet Take 1 tablet (200 mg total) by mouth daily. TAKE 1 (200 MG) TABLET EACH MORNING. 30 tablet 1   Multiple Vitamin (MULTIVITAMIN) tablet Take 1 tablet by mouth daily.     nystatin-triamcinolone ointment (MYCOLOG) Apply 1 application topically 2 (two) times daily. 30 g 1   Omega-3 Fatty Acids (FISH OIL) 1200 MG CAPS Take 1,200 mg by mouth daily.      oxyCODONE-acetaminophen (PERCOCET/ROXICET) 5-325 MG tablet Take 1 tablet by mouth every 6 (six) hours as needed for moderate pain (pain score 4-6). Do not refill less than 30 days from prior refill 120 tablet  0   oxyCODONE-acetaminophen (PERCOCET/ROXICET) 5-325 MG tablet Take 1 tablet by mouth every 6 (six) hours as needed for moderate pain (pain score 4-6). Do not refill less than 30 days from prior refill 120 tablet 0   Polyethyl Glycol-Propyl Glycol 0.4-0.3 % SOLN Place 1 drop into both eyes daily as needed (for dry eyes).     predniSONE (DELTASONE) 10 MG tablet Take 10 mg by mouth 2 (two) times daily. Taking 15mg  currently due to flare     Probiotic Product (PROBIOTIC DAILY PO) Take 1 capsule by mouth daily.     solifenacin (VESICARE) 10 MG tablet Take 1 tablet (10 mg total) by mouth daily. 30 tablet 2   triamcinolone cream (KENALOG) 0.1 % APPLY TO AFFECTED AREA TWICE A DAY UNTIL CLEAR THEN AS NEEDED     Turmeric 500 MG TABS Take 1 tablet by mouth daily.     No current facility-administered medications on file prior to  visit.     ROS see history of present illness  Objective  Physical Exam Vitals:   10/26/23 1037  BP: 128/72  Pulse: 86  SpO2: 98%    BP Readings from Last 3 Encounters:  10/26/23 128/72  09/20/23 124/84  08/13/23 138/64   Wt Readings from Last 3 Encounters:  10/26/23 146 lb 3.2 oz (66.3 kg)  09/20/23 153 lb 12.8 oz (69.8 kg)  08/20/23 154 lb (69.9 kg)    Physical Exam HENT:     Mouth/Throat:     Mouth: Mucous membranes are moist.     Pharynx: Oropharyngeal exudate (White exudate on soft palate, does have some erythema of the posterior oropharynx) present.  Skin:    Comments: Some cracks on a few of her fingers at the distal tips, no signs of infection      Assessment/Plan: Please see individual problem list.  Sore throat Assessment & Plan: New onset issue.  Strep test was negative.  Suspect patient has thrush.  Will treat with nystatin swish and swallow as outlined.  If not improving she will let us know.  She can also trial Zyrtec over-the-counter.  Orders: -     Nystatin; Take 5 mLs (500,000 Units total) by mouth 4 (four) times daily for 7 days. Swish in the mouth and retain for as long as possible (several minutes) before swallowing.  Dispense: 120 mL; Refill: 0 -     POCT rapid strep A  Psoriasis Assessment & Plan: Chronic issue.  Encouraged patient to keep the areas covered as she is able to.  Discussed minimizing how long her hands get wet for.  She will continue to see dermatology.      Return if symptoms worsen or fail to improve.   Marikay Alar, MD [facility]

## 2023-10-26 NOTE — Patient Instructions (Signed)
Nice to see you. I suspect you have thrush.  Will treat you with nystatin swish and swallow.  Please follow the instructions on this. You can also try Zyrtec over-the-counter to see if there is some allergy component to the mucus production. If you are not improving with these things please let us know.

## 2023-10-26 NOTE — Assessment & Plan Note (Signed)
New onset issue.  Strep test was negative.  Suspect patient has thrush.  Will treat with nystatin swish and swallow as outlined.  If not improving she will let us know.  She can also trial Zyrtec over-the-counter.

## 2023-10-30 ENCOUNTER — Encounter: Payer: Self-pay | Admitting: Physical Therapy

## 2023-10-30 ENCOUNTER — Telehealth: Payer: Self-pay | Admitting: Physical Therapy

## 2023-10-30 ENCOUNTER — Ambulatory Visit: Payer: Medicare HMO | Admitting: Physical Therapy

## 2023-10-30 DIAGNOSIS — M5459 Other low back pain: Secondary | ICD-10-CM

## 2023-10-30 DIAGNOSIS — R262 Difficulty in walking, not elsewhere classified: Secondary | ICD-10-CM | POA: Diagnosis not present

## 2023-10-30 NOTE — Telephone Encounter (Signed)
Spoke with patient notifying patient of missed PT visit scheduled at 11:15am today. She said she did not have today's appointment written on her calendar and she has had a sore throat/thrush from getting off prednisone. She was informed of an available appointment time at 1pm this afternoon and will be rescheduled for that time.  Let patient know that with any no-show I am required to review our cancellation policy that after 2 no-shows we cannot schedule more than 1 week at a time and/or we may remove a patient from the schedule.   Ichelle Harral [place], SPT [facility]   [facility] [address] [place], [place] [address] P: [phone] I F: [phone]

## 2023-10-30 NOTE — Therapy (Signed)
OUTPATIENT PHYSICAL THERAPY TREATMENT   Patient Name: Madeline Young MRN: [id number] DOB:06-Oct-1956, 68 y.o., female Today's Date: 10/30/2023  END OF SESSION:  PT End of Session - 10/30/23 1434     Visit Number 21    Number of Visits 30    Date for PT Re-Evaluation 12/27/23    Authorization Type [employer] reporting period from  10/18/2023    Progress Note Due on Visit 30    PT Start Time 1303    PT Stop Time 1351    PT Time Calculation (min) 48 min    Equipment Utilized During Treatment Gait belt    Activity Tolerance Patient tolerated treatment well    Behavior During Therapy WFL for tasks assessed/performed               Past Medical History:  Diagnosis Date   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   ADHD (attention deficit hyperactivity disorder)    Allergy    Anxiety    Arthritis    Cataract    bil cateracts removed   Complication of anesthesia    first colonoscopy pt states she woke up   Depression    GERD (gastroesophageal reflux disease)    Globus sensation    Hyperlipidemia    Incontinence    bowels at times   Insomnia    Multinodular goiter (nontoxic)    Psoriatic arthritis (HCC)    Spinal stenosis of lumbar region 03/10/2015   MRI    Statin intolerance 01/27/2013   Past Surgical History:  Procedure Laterality Date   BACK SURGERY  02/17/2016   Dr. Leotis Shames- L4-L5 fusion    BIOPSY THYROID  2014   COLONOSCOPY     COLONOSCOPY W/ POLYPECTOMY     EYE SURGERY     bilateral cataract surgery w/ lens implant   FOOT SURGERY  2006   Right foot , secondary to severe loss of joint [facility])   POLYPECTOMY     TONSILLECTOMY     UPPER GASTROINTESTINAL ENDOSCOPY  2005   With empiric esophageal dilation   Patient Active Problem List   Diagnosis Date Noted   Sore throat 10/26/2023   Psoriasis 10/26/2023   Acute pansinusitis 10/03/2023   Unintentional Tylenol overdose, sequela 08/14/2023   Chronic pain not due to malignancy 08/14/2023   Slac  (scapholunate advanced collapse) of wrist, left 06/03/2023   Mass of abdomen 05/23/2023   Urge incontinence 05/16/2022   Onychomycosis 08/17/2021   Drug allergy 06/29/2021   Fibromyalgia 06/29/2021   Generalized osteoarthritis 06/29/2021   Hand joint pain 06/29/2021   Other intervertebral disc degeneration, lumbar region 06/29/2021   Other long term (current) drug therapy 06/29/2021   Sciatica 01/28/2020   Rhinitis, non-allergic 11/10/2019   Insomnia 08/10/2019   Anxiety 10/11/2018   Carotid artery stenosis, asymptomatic, bilateral 10/29/2016   ADD (attention deficit disorder) 10/29/2016   Spondylolisthesis of lumbar region 02/17/2016   Globus sensation since at least 2005 04/29/2015   GERD (gastroesophageal reflux disease) 11/17/2014   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   Travel advice encounter 01/27/2013   Multinodular goiter (nontoxic) 08/11/2012   Overweight (BMI 25.0-29.9) 08/11/2012   Psoriatic arthritis, destructive type (HCC)    Screening for cervical cancer 08/21/2011   Screening for breast cancer 08/21/2011   Hx of adenomatous polyp of colon 10/06/2008   Familial hyperlipidemia 09/15/2008   Major depressive disorder, single episode, in remission (HCC) 09/15/2008   ALLERGIC RHINITIS 09/15/2008    PCP: Dr. Duncan Dull   REFERRING  PROVIDER: Duke Salvia NP   REFERRING DIAG: Spinal stenosis of lumbar region  Rationale for Evaluation and Treatment: Rehabilitation  THERAPY DIAG:  Other low back pain  Difficulty in walking, not elsewhere classified  ONSET DATE: 2017   PERTINENT HISTORY:  Pt reports that she has three different types of arthritis: osteo, rheumatoid, and psoriatic. She had lumbar fusion in 2017 which she felt did not help significantly. She had an infusion for her psoriatic arthritis this past spring and it caused her low back pain. She recently had a spinal injection and this resolved radiating pain on her LLE, but she still has excruciating, radiating  pain down her right lower extremity. She feels unsteady most of the time and she has to use walker to keep from falling. Pt has also been told that she needs a reverse total shoulder because right shoulder RTC damage.   SUBJECTIVE:                                                                                                                                                                                           SUBJECTIVE STATEMENT: Patient states she went to the doctor on 10/26/23 with a sore throat, tested negative for strep throat, and was diagnosed with thrush from getting off prednisone. She has been using nystatin mouth wash and symptoms have improved. She has a few cracks on the distal tips of her middle finger and thumb on her right hand for approximately 2 weeks that are taking a while to heal. She reports she has 4/10 pain in her right hip and her right shoulder and back feel about the same as last session but they only feel aggravated with overuse. Patient reports she has not been doing her HEP, but has been doing a lot of household tasks and cleaning.    Patient is interested in going to a gym or personal trainer to continue leg press and other strengthening exercises outside of PT.   PAIN:  Are you having pain? Yes  NPRS: 4/10 at right hip.   PRECAUTIONS: None  PATIENT GOALS: To improve balance and to decrease pain   OBJECTIVE  1 RM TESTING (calculated from 10RM or less):  Seated leg press, seat position 1, single leg (last tested 10/16/2023);  R: 39# L: 57#   TODAY'S TREATMENT:  Therapeutic exercise: to centralize symptoms and improve ROM, strength, muscular endurance, and activity tolerance required for successful completion of functional activities.  NuStep level 5 using bilateral upper and lower extremities. Seat/handle setting 9/12. For improved extremity mobility, muscular endurance, and activity tolerance; and to  induce the analgesic effect of aerobic exercise, stimulate improved joint nutrition, and prepare body structures and systems for following interventions. x 5  minutes. Average SPM = 58.  Seated (on clear theraball) single arm row:  2x20 each side at 10#  Seated leg press, seat position 1 R: 2x20 at 25# (~60% 1RM) L: 2x20 at 35# (~60% 1RM)  Education on HEP including handout   Neuromuscular Re-education: to improve, balance, postural strength, muscle activation patterns, and stabilization strength required for functional activities:  Seated on clear theraball, march with step overs (gait belt used)  1x10 each side 2x5 each side   Seated on clear theraball, alternating UE/LE lifts (gait belt used)  2x10 each side   Pt required multimodal cuing for proper technique and to facilitate improved neuromuscular control, strength, range of motion, and functional ability resulting in improved performance and form.   PATIENT EDUCATION:  Education details: form and technique for correct performance of exercise.  Person educated: Patient Education method: Explanation, Demonstration, Verbal cues, and Handouts Education comprehension: verbalized understanding, returned demonstration, and verbal cues required  HOME EXERCISE PROGRAM: Access Code: [email or web address] URL: [email or web address] Date: 10/30/2023 Prepared by: Alaiza Yau [place]  Exercises - Sit to Stand  - 3-4 x weekly - 2 sets - 7-10 reps - Standing March with Counter Support  - 3-4 x weekly - 3 sets - 10 reps - Standing Toe Taps  - 3-4 x weekly - 3 sets - 10 reps - Seated Single Arm Shoulder Row with Anchored Resistance  - 3-7 x weekly - 2-3 sets - 15 reps - 2 seconds hold  HOME EXERCISE PROGRAM [[email or web address]] View at [email or web address]" using code: [id number] Supine Marching (phase 2) - 'up up, down down' -  Repeat 10 Repetitions, Complete 2 Sets, Perform 2 Times a Day  SCIATIC NERVE GLIDE -  Repeat 15 Repetitions, Complete 1 Set,  Perform 2    ASSESSMENT:  CLINICAL IMPRESSION: Patient arrives to session after recovering from thrush last week and feeling tired. The focus of today's session was to continue progressing functional strength/endurance of the upper/lower extremities and improve balance/stabilization strength for functional activities. Seated alternating UE/LE lifts were better tolerated when verbally cued to reduce trunk rotation during exercise. She tolerated all other exercises well and was able to complete them with minimal to no lasting increase in pain. Patient was provided handout of most recent HEP to improve participation at home. She was also provided handouts of local gym information to look into personal training availability. Patient reports feeling muscle fatigue but no changes in her pain at the end of session. Patient would benefit from continued management of limiting condition by skilled physical therapist to address remaining impairments and functional limitations to work towards stated goals and return to PLOF or maximal functional independence.    OBJECTIVE IMPAIRMENTS: Abnormal gait, decreased balance, decreased endurance, decreased knowledge of use of DME, decreased mobility, difficulty walking, decreased ROM, decreased strength, hypomobility, impaired flexibility, impaired sensation, impaired UE functional use, and pain.   ACTIVITY LIMITATIONS: carrying, lifting, bending, standing, squatting, sleeping, stairs, bed mobility, bathing, toileting, dressing, reach over head, hygiene/grooming, and locomotion level  PARTICIPATION LIMITATIONS: cleaning, laundry, driving, shopping, community activity, and yard work  PERSONAL FACTORS: Past/current experiences, Time  since onset of injury/illness/exacerbation, and 3+ comorbidities: psoriatic and rheumatoid arthritis,   are also affecting patient's functional outcome.   REHAB POTENTIAL: Fair chronicity of low back and multiple co-morbidities  CLINICAL  DECISION MAKING: Evolving/moderate complexity  EVALUATION COMPLEXITY: Moderate   GOALS: Goals reviewed with patient? No  SHORT TERM GOALS: Target date: 06/05/2023  Pt will be independent with HEP in order to improve strength and balance in order to decrease fall risk and improve function at home and work. Baseline: NT 06/05/23: Able to perform independently  Goal status: Achieved   2.  Pt will increase by at least 0.13 m/s in order to demonstrate clinically significant improvement in community ambulation.  Baseline:  0.81 m/sec; 0.82 m/s (07/30/2023); 0.86 m/s (10/18/2023); Goal status: in progress  3.  Pt will increase by at least 75m (168ft) in order to demonstrate clinically significant improvement in cardiopulmonary endurance and community ambulation Baseline:  800 ft; 805 feet with SPC (07/30/2023); 831 feet with SPC (10/17/2022); Goal status: In progress  4.  Patient will show correct use of single point cane and rollator for improved ambulation and to decrease risk of falling.  Baseline: NT 06/04/23: Using SPC  Goal status: ACHIEVED    LONG TERM GOALS: Target date: 07/31/2023. Target date updated to 10/22/2023 for all unmet goals on 07/30/2023. Target date updated to 12/27/2023 for all unmet goals on 10/18/2023  Patient will have improved function and activity level as evidenced by an increase in FOTO score by 10 points or more.  Baseline: 40/100 with target of 47: 39 at visit #10 (07/30/2023); 48 at visit #20 (10/18/23); Goal status: MET  2.  Patient will ambulate >=1000 ft for as evidence of improved aerobic endurance and lumbar function.  Baseline: 800 ft; 805 feet with SPC (07/30/2023); 831 feet with SPC (10/17/2022); Goal status: In-progress  3. Patient will perform 10 meter walk test in >=1 m/sec for improved mobility and to decrease her risk of falling. Baseline: 0.81 m/sec; 0.82 m/s (07/30/2023); 0.86 m/s (10/18/2023);  Goal status: In progress  4.  Patient will  demonstrate an improvement in DGI score of >=2 pts as evidence of improved dynamic balance to decrease risk of falling (Pardasaney et al, 2012). Baseline: 14/24; 15/24 (07/30/2023); 17/24 (10/18/2023); Goal status: MET   PLAN:  PT FREQUENCY: 1-2x/week  PT DURATION: 10 weeks  PLANNED INTERVENTIONS: Therapeutic exercises, Therapeutic activity, Neuromuscular re-education, Balance training, Gait training, Patient/Family education, Self Care, Joint mobilization, Joint manipulation, Stair training, Vestibular training, Canalith repositioning, DME instructions, Aquatic Therapy, Dry Needling, Electrical stimulation, Spinal manipulation, Spinal mobilization, Cryotherapy, Moist heat, Traction, Ultrasound, Manual therapy, and Re-evaluation.  PLAN FOR NEXT SESSION: update HEP as appropriate, progressive LE/trunk/functional strengthening, motor control, and dynamic balance exercises.    Zhara Gieske [place], SPT [employer] DPTE   Huntley Dec R. Ilsa Iha, PT, DPT, Cert. MDT 10/30/23, 7:32 PM  [facility] [address] [place], [place] [address] P: [phone] I F: [phone]

## 2023-11-01 ENCOUNTER — Ambulatory Visit: Payer: Medicare HMO | Admitting: Physical Therapy

## 2023-11-01 ENCOUNTER — Encounter: Payer: Self-pay | Admitting: Physical Therapy

## 2023-11-01 DIAGNOSIS — M5459 Other low back pain: Secondary | ICD-10-CM

## 2023-11-01 DIAGNOSIS — R262 Difficulty in walking, not elsewhere classified: Secondary | ICD-10-CM | POA: Diagnosis not present

## 2023-11-01 NOTE — Therapy (Signed)
OUTPATIENT PHYSICAL THERAPY TREATMENT   Patient Name: Madeline Young MRN: [id number] DOB:May 23, 1956, 68 y.o., female Today's Date: 11/01/2023  END OF SESSION:  PT End of Session - 11/01/23 1312     Visit Number 22    Number of Visits 30    Date for PT Re-Evaluation 12/27/23    Authorization Type [employer] reporting period from  10/18/2023    Authorization - Visit Number --    Authorization - Number of Visits --    Progress Note Due on Visit 30    PT Start Time 1126    PT Stop Time 1206    PT Time Calculation (min) 40 min    Equipment Utilized During Treatment Gait belt    Activity Tolerance Patient tolerated treatment well    Behavior During Therapy WFL for tasks assessed/performed                Past Medical History:  Diagnosis Date   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   ADHD (attention deficit hyperactivity disorder)    Allergy    Anxiety    Arthritis    Cataract    bil cateracts removed   Complication of anesthesia    first colonoscopy pt states she woke up   Depression    GERD (gastroesophageal reflux disease)    Globus sensation    Hyperlipidemia    Incontinence    bowels at times   Insomnia    Multinodular goiter (nontoxic)    Psoriatic arthritis (HCC)    Spinal stenosis of lumbar region 03/10/2015   MRI    Statin intolerance 01/27/2013   Past Surgical History:  Procedure Laterality Date   BACK SURGERY  02/17/2016   Dr. Leotis Shames- L4-L5 fusion    BIOPSY THYROID  2014   COLONOSCOPY     COLONOSCOPY W/ POLYPECTOMY     EYE SURGERY     bilateral cataract surgery w/ lens implant   FOOT SURGERY  2006   Right foot , secondary to severe loss of joint [facility])   POLYPECTOMY     TONSILLECTOMY     UPPER GASTROINTESTINAL ENDOSCOPY  2005   With empiric esophageal dilation   Patient Active Problem List   Diagnosis Date Noted   Sore throat 10/26/2023   Psoriasis 10/26/2023   Acute pansinusitis 10/03/2023   Unintentional Tylenol overdose, sequela  08/14/2023   Chronic pain not due to malignancy 08/14/2023   Slac (scapholunate advanced collapse) of wrist, left 06/03/2023   Mass of abdomen 05/23/2023   Urge incontinence 05/16/2022   Onychomycosis 08/17/2021   Drug allergy 06/29/2021   Fibromyalgia 06/29/2021   Generalized osteoarthritis 06/29/2021   Hand joint pain 06/29/2021   Other intervertebral disc degeneration, lumbar region 06/29/2021   Other long term (current) drug therapy 06/29/2021   Sciatica 01/28/2020   Rhinitis, non-allergic 11/10/2019   Insomnia 08/10/2019   Anxiety 10/11/2018   Carotid artery stenosis, asymptomatic, bilateral 10/29/2016   ADD (attention deficit disorder) 10/29/2016   Spondylolisthesis of lumbar region 02/17/2016   Globus sensation since at least 2005 04/29/2015   GERD (gastroesophageal reflux disease) 11/17/2014   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   Travel advice encounter 01/27/2013   Multinodular goiter (nontoxic) 08/11/2012   Overweight (BMI 25.0-29.9) 08/11/2012   Psoriatic arthritis, destructive type (HCC)    Screening for cervical cancer 08/21/2011   Screening for breast cancer 08/21/2011   Hx of adenomatous polyp of colon 10/06/2008   Familial hyperlipidemia 09/15/2008   Major depressive disorder, single episode, in  remission (HCC) 09/15/2008   ALLERGIC RHINITIS 09/15/2008    PCP: Dr. Duncan Dull   REFERRING PROVIDER: Duke Salvia NP   REFERRING DIAG: Spinal stenosis of lumbar region  Rationale for Evaluation and Treatment: Rehabilitation  THERAPY DIAG:  Other low back pain  Difficulty in walking, not elsewhere classified  ONSET DATE: 2017   PERTINENT HISTORY:  Pt reports that she has three different types of arthritis: osteo, rheumatoid, and psoriatic. She had lumbar fusion in 2017 which she felt did not help significantly. She had an infusion for her psoriatic arthritis this past spring and it caused her low back pain. She recently had a spinal injection and this resolved  radiating pain on her LLE, but she still has excruciating, radiating pain down her right lower extremity. She feels unsteady most of the time and she has to use walker to keep from falling. Pt has also been told that she needs a reverse total shoulder because right shoulder RTC damage.   SUBJECTIVE:                                                                                                                                                                                           SUBJECTIVE STATEMENT: Patient arrives to session running late getting out of the house today. She reports feeling pressure in her low back while standing and some soreness in her right shoulder. She reports mopping the floors yesterday that resulted in increased right hip and shoulder pain last night but they they are feeling much better today. She reports feeling okay after last session with some fatigue the evening after her visit but no change in her pain. Patient reports she has not been doing her HEP, but has been doing a lot of household cleaning and was able to finally take down and put away her Christmas tree, which has been a goal for several weeks.   PAIN:  Are you having pain?   NPRS: 0/10 pain, just soreness in her low back and right shoulder    PRECAUTIONS: None  PATIENT GOALS: To improve balance and to decrease pain   OBJECTIVE  1 RM TESTING (calculated from 10RM or less):  Seated leg press, seat position 1, single leg (last tested 10/16/2023);  R: 39# L: 57#   TODAY'S TREATMENT:                                                         Therapeutic  exercise: to centralize symptoms and improve ROM, strength, muscular endurance, and activity tolerance required for successful completion of functional activities.  NuStep level 5 using bilateral upper and lower extremities. Seat/handle setting 9/11. For improved extremity mobility, muscular endurance, and activity tolerance; and to induce the analgesic  effect of aerobic exercise, stimulate improved joint nutrition, and prepare body structures and systems for following interventions. x 5  minutes. Average SPM = 67.  Seated leg press, seat position 1 R: 1x20 at 25# (~60% 1RM) L: 1x20 at 35# (~60% 1RM)  Education on HEP   Therapeutic activities: dynamic activities for functional strengthening and improved functional activity tolerance.  Sit to stands on 17 inch chair   1x10 with 6# DB 1 min rest   Goblet Squats with airex pad on chair   1x10 with 6# DB  1 min rest  Neuromuscular Re-education: to improve, balance, postural strength, muscle activation patterns, and stabilization strength required for functional activities:  Seated on clear theraball, march with step overs (gait belt used)  1x10 each side  Seated on clear theraball, alternating UE/LE lifts (gait belt used)  1x15 each side   Ball toss at [employer] with double leg stance on airex pad   1x20 with pink ball   Pt required multimodal cuing for proper technique and to facilitate improved neuromuscular control, strength, range of motion, and functional ability resulting in improved performance and form.  PATIENT EDUCATION:  Education details: form and technique for correct performance of exercise.  Person educated: Patient Education method: Explanation, Demonstration, Verbal cues, and Handouts Education comprehension: verbalized understanding, returned demonstration, and verbal cues required  HOME EXERCISE PROGRAM: Access Code: [email or web address] URL: [email or web address] Date: 10/30/2023 Prepared by: Journie Howson [place]  Exercises - Sit to Stand  - 3-4 x weekly - 2 sets - 7-10 reps - Standing March with Counter Support  - 3-4 x weekly - 3 sets - 10 reps - Standing Toe Taps  - 3-4 x weekly - 3 sets - 10 reps - Seated Single Arm Shoulder Row with Anchored Resistance  - 3-7 x weekly - 2-3 sets - 15 reps - 2 seconds hold  HOME EXERCISE PROGRAM [[id number]] View at  "[email or web address]" using code: [id number] Supine Marching (phase 2) - 'up up, down down' -  Repeat 10 Repetitions, Complete 2 Sets, Perform 2 Times a Day  SCIATIC NERVE GLIDE -  Repeat 15 Repetitions, Complete 1 Set, Perform 2    ASSESSMENT:  CLINICAL IMPRESSION: Patient arrives to session with improvement in her symptoms and pain since last session. The focus of today's session was to continue progressing functional strength of the lower extremities and exercises to challenge balance/stabilization to improve functional activity tolerance. Patient tolerated treatment interventions well and was able to complete all exercises with no increase in pain/discomfort. Patient responded well to multimodal cuing for proper technique during squatting exercises to facilitate improved strength, range of motion, and functional ability resulting in improved form. Patient reported feeling good at the end of session with muscles feeling tired, but no change in pain. Patient curious about recommendations for a hand specialist due to increased difficulty with her hands and was advised there is an OT/hand therapist on staff at the clinic and to contact her doctor for a referral if interested in investigating further. Patient would benefit from continued management of limiting condition by skilled physical therapist to address remaining impairments and functional limitations to work towards stated goals and return to PLOF or maximal functional independence.    OBJECTIVE IMPAIRMENTS:  Abnormal gait, decreased balance, decreased endurance, decreased knowledge of use of DME, decreased mobility, difficulty walking, decreased ROM, decreased strength, hypomobility, impaired flexibility, impaired sensation, impaired UE functional use, and pain.   ACTIVITY LIMITATIONS: carrying, lifting, bending, standing, squatting, sleeping, stairs, bed mobility, bathing, toileting, dressing, reach over head, hygiene/grooming, and locomotion  level  PARTICIPATION LIMITATIONS: cleaning, laundry, driving, shopping, community activity, and yard work  PERSONAL FACTORS: Past/current experiences, Time since onset of injury/illness/exacerbation, and 3+ comorbidities: psoriatic and rheumatoid arthritis,   are also affecting patient's functional outcome.   REHAB POTENTIAL: Fair chronicity of low back and multiple co-morbidities  CLINICAL DECISION MAKING: Evolving/moderate complexity  EVALUATION COMPLEXITY: Moderate   GOALS: Goals reviewed with patient? No  SHORT TERM GOALS: Target date: 06/05/2023  Pt will be independent with HEP in order to improve strength and balance in order to decrease fall risk and improve function at home and work. Baseline: NT 06/05/23: Able to perform independently  Goal status: Achieved   2.  Pt will increase by at least 0.13 m/s in order to demonstrate clinically significant improvement in community ambulation.  Baseline:  0.81 m/sec; 0.82 m/s (07/30/2023); 0.86 m/s (10/18/2023); Goal status: in progress  3.  Pt will increase by at least 78m (167ft) in order to demonstrate clinically significant improvement in cardiopulmonary endurance and community ambulation Baseline:  800 ft; 805 feet with SPC (07/30/2023); 831 feet with SPC (10/17/2022); Goal status: In progress  4.  Patient will show correct use of single point cane and rollator for improved ambulation and to decrease risk of falling.  Baseline: NT 06/04/23: Using SPC  Goal status: ACHIEVED    LONG TERM GOALS: Target date: 07/31/2023. Target date updated to 10/22/2023 for all unmet goals on 07/30/2023. Target date updated to 12/27/2023 for all unmet goals on 10/18/2023  Patient will have improved function and activity level as evidenced by an increase in FOTO score by 10 points or more.  Baseline: 40/100 with target of 47: 39 at visit #10 (07/30/2023); 48 at visit #20 (10/18/23); Goal status: MET  2.  Patient will ambulate >=1000 ft for  as evidence of improved aerobic endurance and lumbar function.  Baseline: 800 ft; 805 feet with SPC (07/30/2023); 831 feet with SPC (10/17/2022); Goal status: In-progress  3. Patient will perform 10 meter walk test in >=1 m/sec for improved mobility and to decrease her risk of falling. Baseline: 0.81 m/sec; 0.82 m/s (07/30/2023); 0.86 m/s (10/18/2023);  Goal status: In progress  4.  Patient will demonstrate an improvement in DGI score of >=2 pts as evidence of improved dynamic balance to decrease risk of falling (Pardasaney et al, 2012). Baseline: 14/24; 15/24 (07/30/2023); 17/24 (10/18/2023); Goal status: MET   PLAN:  PT FREQUENCY: 1-2x/week  PT DURATION: 10 weeks  PLANNED INTERVENTIONS: Therapeutic exercises, Therapeutic activity, Neuromuscular re-education, Balance training, Gait training, Patient/Family education, Self Care, Joint mobilization, Joint manipulation, Stair training, Vestibular training, Canalith repositioning, DME instructions, Aquatic Therapy, Dry Needling, Electrical stimulation, Spinal manipulation, Spinal mobilization, Cryotherapy, Moist heat, Traction, Ultrasound, Manual therapy, and Re-evaluation.  PLAN FOR NEXT SESSION: update HEP as appropriate, progressive LE/trunk/functional strengthening, motor control, and dynamic balance exercises.    Alina Gilkey [place], SPT [employer] DPTE   Huntley Dec R. Ilsa Iha, PT, DPT, Cert. MDT 11/01/23, 1:33 PM  [facility] [address] [place], [place] [address] P: [phone] I F: [phone]

## 2023-11-07 ENCOUNTER — Other Ambulatory Visit: Payer: Self-pay

## 2023-11-07 ENCOUNTER — Telehealth: Payer: Self-pay | Admitting: Psychiatry

## 2023-11-07 DIAGNOSIS — F900 Attention-deficit hyperactivity disorder, predominantly inattentive type: Secondary | ICD-10-CM

## 2023-11-07 MED ORDER — LISDEXAMFETAMINE DIMESYLATE 50 MG PO CAPS: 50.0000 mg | ORAL_CAPSULE | Freq: Every day | ORAL | 0 refills | Status: DC

## 2023-11-07 NOTE — Telephone Encounter (Signed)
Madeline Young called at 10:;38 to request refill of her Vyvanse 50mg .  Appt 2/6.  She is presently in [place] and will be there through the weekend and will run out Friday.  [facility] [address] [place], [place]  [phone]

## 2023-11-07 NOTE — Telephone Encounter (Signed)
PENDED VYVANSE 50 MG TO [facility]

## 2023-11-08 ENCOUNTER — Other Ambulatory Visit: Payer: Self-pay | Admitting: Internal Medicine

## 2023-11-10 DIAGNOSIS — M3500 Sicca syndrome, unspecified: Secondary | ICD-10-CM

## 2023-11-10 HISTORY — DX: Sjogren syndrome, unspecified: M35.00

## 2023-11-12 ENCOUNTER — Encounter: Payer: Medicare HMO | Admitting: Physical Therapy

## 2023-11-13 ENCOUNTER — Encounter: Payer: Self-pay | Admitting: Internal Medicine

## 2023-11-13 ENCOUNTER — Ambulatory Visit (INDEPENDENT_AMBULATORY_CARE_PROVIDER_SITE_OTHER): Payer: Medicare Other | Admitting: Internal Medicine

## 2023-11-13 VITALS — BP 120/78 | HR 97 | Ht 65.0 in | Wt 145.4 lb

## 2023-11-13 DIAGNOSIS — G629 Polyneuropathy, unspecified: Secondary | ICD-10-CM | POA: Diagnosis not present

## 2023-11-13 DIAGNOSIS — G5603 Carpal tunnel syndrome, bilateral upper limbs: Secondary | ICD-10-CM | POA: Insufficient documentation

## 2023-11-13 DIAGNOSIS — R634 Abnormal weight loss: Secondary | ICD-10-CM | POA: Diagnosis not present

## 2023-11-13 DIAGNOSIS — M8580 Other specified disorders of bone density and structure, unspecified site: Secondary | ICD-10-CM | POA: Insufficient documentation

## 2023-11-13 DIAGNOSIS — M5431 Sciatica, right side: Secondary | ICD-10-CM

## 2023-11-13 DIAGNOSIS — G5602 Carpal tunnel syndrome, left upper limb: Secondary | ICD-10-CM

## 2023-11-13 DIAGNOSIS — Z1231 Encounter for screening mammogram for malignant neoplasm of breast: Secondary | ICD-10-CM

## 2023-11-13 DIAGNOSIS — M85852 Other specified disorders of bone density and structure, left thigh: Secondary | ICD-10-CM

## 2023-11-13 DIAGNOSIS — M4316 Spondylolisthesis, lumbar region: Secondary | ICD-10-CM

## 2023-11-13 MED ORDER — CELECOXIB 200 MG PO CAPS: 200.0000 mg | ORAL_CAPSULE | Freq: Two times a day (BID) | ORAL | 1 refills | Status: DC

## 2023-11-13 NOTE — Progress Notes (Signed)
 Subjective:  Patient ID: Madeline Young, female    DOB: 1956/02/06  Age: 68 y.o. MRN: [id number]  CC: The primary encounter diagnosis was Encounter for screening mammogram for malignant neoplasm of breast. Diagnoses of Osteopenia of left thigh, Neuropathy, Weight loss, unintentional, Carpal tunnel syndrome of left wrist, Sciatica of right side, Spondylolisthesis of lumbar region, and Carpal tunnel syndrome, bilateral were also pertinent to this visit.   HPI Madeline Young presents for  Chief Complaint  Patient presents with   Medical Management of Chronic Issues    1) OSTEOPENIA:  HAS BEEN PRESCRIBED ALENDRONATE BY HER  DR OH AT [facility] HAS NOT STARTED IT. Due to anticipated dental procedure    NO KNOWN HISTORY OF  fractures   2) PHARYNGITIS : TREATED BY ES.  STREP TEST NEGATIVE  TREATED WITH NYSTATIN   EMPIRICALLY   3) PAIN MANAGEMENT:  AGGRAVATED BY RECENT ACTIVITY HELPING DAUGHTER MANAGE GRANDCHILDREN .  DEVELOPED SEVERE LEFT HIP PAIN  BUT IT HAS RESOLVED.  LAST I/A HIP INJECTION WAS IN [place]. DR WEINBERG HAS REFUSED TO GIVE HER ANOTHER INJECTION BUT REFERRED HER TO THE [facility] WHO HAS DEFERRED HER BACK TO [facility]    OXYCODONE  REFILLED  JAN 28   4) CTS BILATERAL LEFT WORSE THAN RIGH BY RECENT EMG NERVE CONDUCTION STUDIES.    Outpatient Medications Prior to Visit  Medication Sig Dispense Refill   acetaminophen  (TYLENOL ) 500 MG tablet Take 1,000 mg by mouth every 6 (six) hours as needed.     aspirin 81 MG tablet Take 81 mg by mouth daily.     b complex vitamins capsule Take 1 capsule by mouth daily.     calcium  carbonate (OS-CAL - DOSED IN MG OF ELEMENTAL CALCIUM ) 1250 (500 Ca) MG tablet      cholecalciferol (VITAMIN D ) 1000 UNITS tablet Take 2,000 Units by mouth daily.     clobetasol (TEMOVATE) 0.05 % external solution as needed.     cyanocobalamin  (VITAMIN B12) 1000 MCG tablet Take 1,000 mcg by mouth daily.     diclofenac  Sodium (VOLTAREN ) 1 % GEL APPLY 2  GRAMS TO AFFECTED AREA 4 TIMES A DAY 300 g 3   DULoxetine  (CYMBALTA ) 60 MG capsule Take 2 capsules (120 mg total) by mouth daily. 180 capsule 0   fluocinonide ointment (LIDEX) 0.05 % APPLY TWICE DAILY TO AFFECTED AREAS UNTIL IMPROVED THEN AS NEEDED FOR FLARES     leflunomide (ARAVA) 20 MG tablet Take 20 mg by mouth daily.     lisdexamfetamine (VYVANSE ) 50 MG capsule Take 1 capsule (50 mg total) by mouth daily. 30 capsule 0   Magnesium 250 MG CAPS Take by mouth. Take two by mouth daily     melatonin 5 MG TABS Take 5 mg by mouth at bedtime.     Multiple Vitamin (MULTIVITAMIN) tablet Take 1 tablet by mouth daily.     nystatin -triamcinolone  ointment (MYCOLOG) Apply 1 application topically 2 (two) times daily. 30 g 1   Omega-3 Fatty Acids (FISH OIL) 1200 MG CAPS Take 1,200 mg by mouth daily.      oxyCODONE -acetaminophen  (PERCOCET/ROXICET) 5-325 MG tablet Take 1 tablet by mouth every 6 (six) hours as needed for moderate pain (pain score 4-6). Do not refill less than 30 days from prior refill 120 tablet 0   oxyCODONE -acetaminophen  (PERCOCET/ROXICET) 5-325 MG tablet Take 1 tablet by mouth every 6 (six) hours as needed for moderate pain (pain score 4-6). Do not refill less than 30 days  from prior refill 120 tablet 0   Polyethyl Glycol-Propyl Glycol 0.4-0.3 % SOLN Place 1 drop into both eyes daily as needed (for dry eyes).     Probiotic Product (PROBIOTIC DAILY PO) Take 1 capsule by mouth daily.     solifenacin  (VESICARE ) 10 MG tablet TAKE 1 TABLET BY MOUTH EVERY DAY 90 tablet 1   triamcinolone  cream (KENALOG ) 0.1 % APPLY TO AFFECTED AREA TWICE A DAY UNTIL CLEAR THEN AS NEEDED     trospium  (SANCTURA ) 20 MG tablet Take 20 mg by mouth 2 (two) times daily.     Turmeric 500 MG TABS Take 1 tablet by mouth daily.     celecoxib  (CELEBREX ) 200 MG capsule TAKE 1 CAPSULE BY MOUTH TWICE A DAY 180 capsule 1   alendronate (FOSAMAX) 70 MG tablet Take 70 mg by mouth once a week. (Patient not taking: Reported on 11/13/2023)      furosemide  (LASIX ) 20 MG tablet TAKE 1 TABLET BY MOUTH EVERY DAY AS NEEDED (Patient not taking: Reported on 11/13/2023) 90 tablet 0   modafinil  (PROVIGIL ) 200 MG tablet Take 1 tablet (200 mg total) by mouth daily. TAKE 1 (200 MG) TABLET EACH MORNING. (Patient not taking: Reported on 11/13/2023) 30 tablet 1   ipratropium (ATROVENT ) 0.03 % nasal spray Place into both nostrils. (Patient not taking: Reported on 11/13/2023)     predniSONE  (DELTASONE ) 10 MG tablet Take 10 mg by mouth 2 (two) times daily. Taking 15mg  currently due to flare (Patient not taking: Reported on 11/13/2023)     No facility-administered medications prior to visit.    Review of Systems;  Patient denies headache, fevers, malaise, unintentional weight loss, skin rash, eye pain, sinus congestion and sinus pain, sore throat, dysphagia,  hemoptysis , cough, dyspnea, wheezing, chest pain, palpitations, orthopnea, edema, abdominal pain, nausea, melena, diarrhea, constipation, flank pain, dysuria, hematuria, urinary  Frequency, nocturia, numbness, tingling, seizures,  Focal weakness, Loss of consciousness,  Tremor, insomnia, depression, anxiety, and suicidal ideation.      Objective:  BP 120/78   Pulse 97   Ht 5' 5 (1.651 m)   Wt 145 lb 6.4 oz (66 kg)   SpO2 99%   BMI 24.20 kg/m   BP Readings from Last 3 Encounters:  11/13/23 120/78  10/26/23 128/72  09/20/23 124/84    Wt Readings from Last 3 Encounters:  11/13/23 145 lb 6.4 oz (66 kg)  10/26/23 146 lb 3.2 oz (66.3 kg)  09/20/23 153 lb 12.8 oz (69.8 kg)    Physical Exam Vitals reviewed.  Constitutional:      General: She is not in acute distress.    Appearance: Normal appearance. She is normal weight. She is not ill-appearing, toxic-appearing or diaphoretic.  HENT:     Head: Normocephalic.  Eyes:     General: No scleral icterus.       Right eye: No discharge.        Left eye: No discharge.     Conjunctiva/sclera: Conjunctivae normal.  Cardiovascular:     Rate  and Rhythm: Normal rate and regular rhythm.     Heart sounds: Normal heart sounds.  Pulmonary:     Effort: Pulmonary effort is normal. No respiratory distress.     Breath sounds: Normal breath sounds.  Musculoskeletal:        General: Normal range of motion.  Skin:    General: Skin is warm and dry.  Neurological:     General: No focal deficit present.     Mental Status:  She is alert and oriented to person, place, and time. Mental status is at baseline.  Psychiatric:        Mood and Affect: Mood normal.        Behavior: Behavior normal.        Thought Content: Thought content normal.        Judgment: Judgment normal.    Lab Results  Component Value Date   HGBA1C 6.1 01/31/2022    Lab Results  Component Value Date   CREATININE 0.75 05/16/2023   CREATININE 0.83 11/09/2022   CREATININE 0.72 11/09/2021    Lab Results  Component Value Date   WBC 6.6 05/16/2023   HGB 11.3 (L) 05/16/2023   HCT 34.7 (L) 05/16/2023   PLT 326.0 05/16/2023   GLUCOSE 110 (H) 05/16/2023   CHOL 365 (H) 11/09/2022   TRIG 81.0 11/09/2022   HDL 71.40 11/09/2022   LDLDIRECT 256.0 11/09/2022   LDLCALC 277 (H) 11/09/2022   ALT 22 05/16/2023   AST 21 05/16/2023   NA 139 05/16/2023   K 4.4 05/16/2023   CL 101 05/16/2023   CREATININE 0.75 05/16/2023   BUN 23 05/16/2023   CO2 29 05/16/2023   TSH 0.96 01/31/2022   INR 1.0 02/07/2023   HGBA1C 6.1 01/31/2022   MICROALBUR 1.7 01/31/2022    CT ABDOMEN PELVIS W CONTRAST Result Date: 05/29/2023 CLINICAL DATA:  Lower abdominal and pelvic swelling and firmness on exam. Suspected mass or hernia. EXAM: CT ABDOMEN AND PELVIS WITH CONTRAST TECHNIQUE: Multidetector CT imaging of the abdomen and pelvis was performed using the standard protocol following bolus administration of intravenous contrast. RADIATION DOSE REDUCTION: This exam was performed according to the departmental dose-optimization program which includes automated exposure control, adjustment of the mA  and/or kV according to patient size and/or use of iterative reconstruction technique. CONTRAST:  OMNIPAQUE  IOHEXOL  300 MG/ML  SOLN COMPARISON:  09/27/2009 FINDINGS: Lower Chest: No acute findings. Hepatobiliary:  No suspicious hepatic masses identified. Pancreas:  No mass or inflammatory changes. Spleen: Within normal limits in size and appearance. Adrenals/Urinary Tract: 8 cm benign Bosniak category 2 cyst seen in lower pole of right kidney. No suspicious masses identified. No evidence of ureteral calculi or hydronephrosis. Stomach/Bowel: No evidence of obstruction, inflammatory process or abnormal fluid collections. Normal appendix visualized. Diverticulosis is seen mainly involving the sigmoid colon, however there is no evidence of diverticulitis. Vascular/Lymphatic: No pathologically enlarged lymph nodes. No acute vascular findings. Reproductive:  No mass or other significant abnormality. Other:  None. Musculoskeletal: No suspicious bone lesions identified. Advanced lumbar spine degenerative changes noted with fusion hardware at L4-5 and L5-S1. IMPRESSION: No acute findings.  No evidence of mass or hernia. 8 cm benign Bosniak category 2 cyst in lower pole of right kidney (No followup imaging is recommended). Colonic diverticulosis, without radiographic evidence of diverticulitis. Electronically Signed   By: Norleen DELENA Kil M.D.   On: 05/29/2023 17:01    Assessment & Plan:  .Encounter for screening mammogram for malignant neoplasm of breast -     3D Screening Mammogram, Left and Right; Future  Osteopenia of left thigh Assessment & Plan: TSCORE -1.3 OF LEFT HIP NOV 2024 dexa    Neuropathy -     B12 and Folate Panel -     TSH  Weight loss, unintentional -     Comprehensive metabolic panel  Carpal tunnel syndrome of left wrist -     Ambulatory referral to Neurosurgery  Sciatica of right side -  Ambulatory referral to Neurosurgery  Spondylolisthesis of lumbar region Assessment &  Plan: She has requested ESI I lumbar spine but has been denied by current providers for unclear reasons Referring to Reeves Daisy for evaluation and treatment given lack of [facility] to provide    Carpal tunnel syndrome, bilateral Assessment & Plan: Referring to neurosurgery for treatment of left wrist    Other orders -     Celecoxib ; Take 1 capsule (200 mg total) by mouth 2 (two) times daily.  Dispense: 180 capsule; Refill: 1     I provided 41 minutes of face-to-face time during this encounter reviewing patient's last visit with me, patient's  most recent visit with rheumatology,  orthopedics,   recent surgical and non surgical procedures, previous  labs and imaging studies, counseling on currently addressed issues,  and post visit ordering to diagnostics and therapeutics .   Follow-up: Return in about 3 months (around 02/10/2024).   Verneita LITTIE Kettering, MD

## 2023-11-13 NOTE — Assessment & Plan Note (Signed)
She has requested ESI I lumbar spine but has been denied by current providers for unclear reasons Referring to Madeline Young for evaluation and treatment given lack of [facility] to provide

## 2023-11-13 NOTE — Assessment & Plan Note (Signed)
Referring to neurosurgery for treatment of left wrist

## 2023-11-13 NOTE — Assessment & Plan Note (Signed)
TSCORE -1.3 OF LEFT HIP NOV 2024 dexa

## 2023-11-13 NOTE — Patient Instructions (Signed)
Stop the Sensodyne,  it may be irritating your tongue (it did mine)   Referral to Venetia Night  [facility] here in [place]  for your CTS and low back issues

## 2023-11-14 ENCOUNTER — Encounter: Payer: Self-pay | Admitting: Internal Medicine

## 2023-11-14 ENCOUNTER — Ambulatory Visit: Payer: Self-pay | Admitting: Physical Therapy

## 2023-11-14 LAB — COMPREHENSIVE METABOLIC PANEL
ALT: 14 U/L (ref 0–35)
AST: 18 U/L (ref 0–37)
Albumin: 3.8 g/dL (ref 3.5–5.2)
Alkaline Phosphatase: 57 U/L (ref 39–117)
BUN: 25 mg/dL — ABNORMAL HIGH (ref 6–23)
CO2: 28 meq/L (ref 19–32)
Calcium: 9.2 mg/dL (ref 8.4–10.5)
Chloride: 103 meq/L (ref 96–112)
Creatinine, Ser: 0.75 mg/dL (ref 0.40–1.20)
GFR: 81.33 mL/min (ref >60.00)
Glucose, Bld: 96 mg/dL (ref 70–99)
Potassium: 4.5 meq/L (ref 3.5–5.1)
Sodium: 141 meq/L (ref 135–145)
Total Bilirubin: 0.4 mg/dL (ref 0.2–1.2)
Total Protein: 6.4 g/dL (ref 6.0–8.3)

## 2023-11-14 LAB — TSH: TSH: 0.77 u[IU]/mL (ref 0.35–5.50)

## 2023-11-14 LAB — B12 AND FOLATE PANEL
Folate: >25.2 ng/mL (ref >5.9)
Vitamin B-12: >1537 pg/mL — ABNORMAL HIGH (ref 211–911)

## 2023-11-14 NOTE — Therapy (Deleted)
 OUTPATIENT PHYSICAL THERAPY TREATMENT   Patient Name: Madeline Young MRN: [id number] DOB:08-17-1956, 68 y.o., female Today's Date: 11/14/2023  END OF SESSION:       Past Medical History:  Diagnosis Date   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   ADHD (attention deficit hyperactivity disorder)    Allergy    Anxiety    Arthritis    Cataract    bil cateracts removed   Complication of anesthesia    first colonoscopy pt states she woke up   Depression    GERD (gastroesophageal reflux disease)    Globus sensation    Hyperlipidemia    Incontinence    bowels at times   Insomnia    Multinodular goiter (nontoxic)    Psoriatic arthritis (HCC)    Spinal stenosis of lumbar region 03/10/2015   MRI    Statin intolerance 01/27/2013   Past Surgical History:  Procedure Laterality Date   BACK SURGERY  02/17/2016   Dr. Juliane- L4-L5 fusion    BIOPSY THYROID   2014   COLONOSCOPY     COLONOSCOPY W/ POLYPECTOMY     EYE SURGERY     bilateral cataract surgery w/ lens implant   FOOT SURGERY  2006   Right foot , secondary to severe loss of joint [facility])   POLYPECTOMY     TONSILLECTOMY     UPPER GASTROINTESTINAL ENDOSCOPY  2005   With empiric esophageal dilation   Patient Active Problem List   Diagnosis Date Noted   Osteopenia 11/13/2023   Carpal tunnel syndrome, bilateral 11/13/2023   Sore throat 10/26/2023   Unintentional Tylenol  overdose, sequela 08/14/2023   Chronic pain not due to malignancy 08/14/2023   Slac (scapholunate advanced collapse) of wrist, left 06/03/2023   Urge incontinence 05/16/2022   Onychomycosis 08/17/2021   Drug allergy 06/29/2021   Fibromyalgia 06/29/2021   Generalized osteoarthritis 06/29/2021   Hand joint pain 06/29/2021   Other intervertebral disc degeneration, lumbar region 06/29/2021   Other long term (current) drug therapy 06/29/2021   Sciatica 01/28/2020   Rhinitis, non-allergic 11/10/2019   Insomnia 08/10/2019   Anxiety 10/11/2018   Carotid  artery stenosis, asymptomatic, bilateral 10/29/2016   ADD (attention deficit disorder) 10/29/2016   Spondylolisthesis of lumbar region 02/17/2016   Globus sensation since at least 2005 04/29/2015   GERD (gastroesophageal reflux disease) 11/17/2014   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   Travel advice encounter 01/27/2013   Multinodular goiter (nontoxic) 08/11/2012   Overweight (BMI 25.0-29.9) 08/11/2012   Psoriatic arthritis, destructive type (HCC)    Screening for cervical cancer 08/21/2011   Screening for breast cancer 08/21/2011   Hx of adenomatous polyp of colon 10/06/2008   Familial hyperlipidemia 09/15/2008   Major depressive disorder, single episode, in remission (HCC) 09/15/2008   ALLERGIC RHINITIS 09/15/2008    PCP: Dr. Verneita Kettering   REFERRING PROVIDER: Garnette Hamilton NP   REFERRING DIAG: Spinal stenosis of lumbar region  Rationale for Evaluation and Treatment: Rehabilitation  THERAPY DIAG:  No diagnosis found.  ONSET DATE: 2017   PERTINENT HISTORY:  Pt reports that she has three different types of arthritis: osteo, rheumatoid, and psoriatic. She had lumbar fusion in 2017 which she felt did not help significantly. She had an infusion for her psoriatic arthritis this past spring and it caused her low back pain. She recently had a spinal injection and this resolved radiating pain on her LLE, but she still has excruciating, radiating pain down her right lower extremity. She feels unsteady most of the time  and she has to use walker to keep from falling. Pt has also been told that she needs a reverse total shoulder because right shoulder RTC damage.   SUBJECTIVE:                                                                                                                                                                                           SUBJECTIVE STATEMENT: Patient arrives to session running late getting out of the house today. She reports feeling pressure in her low back  while standing and some soreness in her right shoulder. She reports mopping the floors yesterday that resulted in increased right hip and shoulder pain last night but they they are feeling much better today. She reports feeling okay after last session with some fatigue the evening after her visit but no change in her pain. Patient reports she has not been doing her HEP, but has been doing a lot of household cleaning and was able to finally take down and put away her Christmas tree, which has been a goal for several weeks.   PAIN:  Are you having pain?   NPRS: 0/10 pain, just soreness in her low back and right shoulder    PRECAUTIONS: None  PATIENT GOALS: To improve balance and to decrease pain   OBJECTIVE  1 RM TESTING (calculated from 10RM or less):  Seated leg press, seat position 1, single leg (last tested 10/16/2023);  R: 39# L: 57#   TODAY'S TREATMENT:                                                         Therapeutic exercise: to centralize symptoms and improve ROM, strength, muscular endurance, and activity tolerance required for successful completion of functional activities.   Seated leg press, seat position 1 R: 1x20 at 25# (~60% 1RM) L: 1x20 at 35# (~60% 1RM)  Education on HEP   Therapeutic activities: dynamic activities for functional strengthening and improved functional activity tolerance.   Circuit warm up:  Modified updowns (   Sit to stands on 17 inch chair   1x10 with 6# DB 1 min rest   Goblet Squats with airex pad on chair   1x10 with 6# DB  1 min rest  Sidelying Hip Series            Flexion & Extension:  L: 2x10                       R: 2x10            Abduction:                        L: 2x10                       R: 2x10            Circles:                        L: 2x10                        R: 1x5, 1x10   Neuromuscular Re-education: to improve, balance, postural strength, muscle activation patterns, and  stabilization strength required for functional activities:  Seated on clear theraball, march with step overs (gait belt used)  1x10 each side  Seated on clear theraball, alternating UE/LE lifts (gait belt used)  1x15 each side   Ball toss at [employer] with double leg stance on airex pad   1x20 with pink ball   Pt required multimodal cuing for proper technique and to facilitate improved neuromuscular control, strength, range of motion, and functional ability resulting in improved performance and form.  PATIENT EDUCATION:  Education details: form and technique for correct performance of exercise.  Person educated: Patient Education method: Explanation, Demonstration, Verbal cues, and Handouts Education comprehension: verbalized understanding, returned demonstration, and verbal cues required  HOME EXERCISE PROGRAM: Access Code: [email or web address] URL: [email or web address] Date: 10/30/2023 Prepared by: Sydney Jordan  Exercises - Sit to Stand  - 3-4 x weekly - 2 sets - 7-10 reps - Standing March with Counter Support  - 3-4 x weekly - 3 sets - 10 reps - Standing Toe Taps  - 3-4 x weekly - 3 sets - 10 reps - Seated Single Arm Shoulder Row with Anchored Resistance  - 3-7 x weekly - 2-3 sets - 15 reps - 2 seconds hold  HOME EXERCISE PROGRAM [[email or web address]] View at [email or web address] using code: [email or web address] Supine Marching (phase 2) - 'up up, down down' -  Repeat 10 Repetitions, Complete 2 Sets, Perform 2 Times a Day  SCIATIC NERVE GLIDE -  Repeat 15 Repetitions, Complete 1 Set, Perform 2    ASSESSMENT:  CLINICAL IMPRESSION: Patient arrives to session with improvement in her symptoms and pain since last session. The focus of today's session was to continue progressing functional strength of the lower extremities and exercises to challenge balance/stabilization to improve functional activity tolerance. Patient tolerated treatment interventions well and was able to complete all exercises  with no increase in pain/discomfort. Patient responded well to multimodal cuing for proper technique during squatting exercises to facilitate improved strength, range of motion, and functional ability resulting in improved form. Patient reported feeling good at the end of session with muscles feeling tired, but no change in pain. Patient curious about recommendations for a hand specialist due to increased difficulty with her hands and was advised there is an OT/hand therapist on staff at the clinic and to contact her doctor for a referral if interested in investigating further. Patient would benefit from continued management of limiting condition by skilled physical therapist to address remaining impairments and functional limitations to work towards stated goals and return to PLOF or maximal functional  independence.    OBJECTIVE IMPAIRMENTS: Abnormal gait, decreased balance, decreased endurance, decreased knowledge of use of DME, decreased mobility, difficulty walking, decreased ROM, decreased strength, hypomobility, impaired flexibility, impaired sensation, impaired UE functional use, and pain.   ACTIVITY LIMITATIONS: carrying, lifting, bending, standing, squatting, sleeping, stairs, bed mobility, bathing, toileting, dressing, reach over head, hygiene/grooming, and locomotion level  PARTICIPATION LIMITATIONS: cleaning, laundry, driving, shopping, community activity, and yard work  PERSONAL FACTORS: Past/current experiences, Time since onset of injury/illness/exacerbation, and 3+ comorbidities: psoriatic and rheumatoid arthritis,   are also affecting patient's functional outcome.   REHAB POTENTIAL: Fair chronicity of low back and multiple co-morbidities  CLINICAL DECISION MAKING: Evolving/moderate complexity  EVALUATION COMPLEXITY: Moderate   GOALS: Goals reviewed with patient? No  SHORT TERM GOALS: Target date: 06/05/2023  Pt will be independent with HEP in order to improve strength and  balance in order to decrease fall risk and improve function at home and work. Baseline: NT 06/05/23: Able to perform independently  Goal status: Achieved   2.  Pt will increase by at least 0.13 m/s in order to demonstrate clinically significant improvement in community ambulation.  Baseline:  0.81 m/sec; 0.82 m/s (07/30/2023); 0.86 m/s (10/18/2023); Goal status: in progress  3.  Pt will increase by at least 36m (131ft) in order to demonstrate clinically significant improvement in cardiopulmonary endurance and community ambulation Baseline:  800 ft; 805 feet with SPC (07/30/2023); 831 feet with SPC (10/17/2022); Goal status: In progress  4.  Patient will show correct use of single point cane and rollator for improved ambulation and to decrease risk of falling.  Baseline: NT 06/04/23: Using SPC  Goal status: ACHIEVED    LONG TERM GOALS: Target date: 07/31/2023. Target date updated to 10/22/2023 for all unmet goals on 07/30/2023. Target date updated to 12/27/2023 for all unmet goals on 10/18/2023  Patient will have improved function and activity level as evidenced by an increase in FOTO score by 10 points or more.  Baseline: 40/100 with target of 47: 39 at visit #10 (07/30/2023); 48 at visit #20 (10/18/23); Goal status: MET  2.  Patient will ambulate >=1000 ft for as evidence of improved aerobic endurance and lumbar function.  Baseline: 800 ft; 805 feet with SPC (07/30/2023); 831 feet with SPC (10/17/2022); Goal status: In-progress  3. Patient will perform 10 meter walk test in >=1 m/sec for improved mobility and to decrease her risk of falling. Baseline: 0.81 m/sec; 0.82 m/s (07/30/2023); 0.86 m/s (10/18/2023);  Goal status: In progress  4.  Patient will demonstrate an improvement in DGI score of >=2 pts as evidence of improved dynamic balance to decrease risk of falling (Pardasaney et al, 2012). Baseline: 14/24; 15/24 (07/30/2023); 17/24 (10/18/2023); Goal status: MET   PLAN:  PT  FREQUENCY: 1-2x/week  PT DURATION: 10 weeks  PLANNED INTERVENTIONS: Therapeutic exercises, Therapeutic activity, Neuromuscular re-education, Balance training, Gait training, Patient/Family education, Self Care, Joint mobilization, Joint manipulation, Stair training, Vestibular training, Canalith repositioning, DME instructions, Aquatic Therapy, Dry Needling, Electrical stimulation, Spinal manipulation, Spinal mobilization, Cryotherapy, Moist heat, Traction, Ultrasound, Manual therapy, and Re-evaluation.  PLAN FOR NEXT SESSION: update HEP as appropriate, progressive LE/trunk/functional strengthening, motor control, and dynamic balance exercises.   Camie SAUNDERS. Juli, PT, DPT, Cert. MDT 11/14/23, 9:34 AM  [facility] [address] [place], [place] [address] P: [phone] I F: [phone]

## 2023-11-15 ENCOUNTER — Telehealth: Payer: Medicare HMO | Admitting: Psychiatry

## 2023-11-19 ENCOUNTER — Encounter: Payer: Self-pay | Admitting: Physical Therapy

## 2023-11-19 ENCOUNTER — Ambulatory Visit: Payer: Medicare HMO | Attending: Orthopedic | Admitting: Physical Therapy

## 2023-11-19 DIAGNOSIS — R262 Difficulty in walking, not elsewhere classified: Secondary | ICD-10-CM | POA: Diagnosis present

## 2023-11-19 DIAGNOSIS — R278 Other lack of coordination: Secondary | ICD-10-CM | POA: Insufficient documentation

## 2023-11-19 DIAGNOSIS — M5459 Other low back pain: Secondary | ICD-10-CM | POA: Insufficient documentation

## 2023-11-19 DIAGNOSIS — R2689 Other abnormalities of gait and mobility: Secondary | ICD-10-CM | POA: Diagnosis present

## 2023-11-19 NOTE — Therapy (Signed)
OUTPATIENT PHYSICAL THERAPY TREATMENT   Patient Name: Madeline Young MRN: [id number] DOB:05-27-1956, 68 y.o., female Today's Date: 11/19/2023  END OF SESSION:  PT End of Session - 11/19/23 1720     Visit Number 23    Number of Visits 30    Date for PT Re-Evaluation 12/27/23    Authorization Type [employer] reporting period from  10/18/2023    Authorization - Visit Number 8    Authorization - Number of Visits 20    Progress Note Due on Visit 30    PT Start Time 1431    PT Stop Time 1520    PT Time Calculation (min) 49 min    Equipment Utilized During Treatment Gait belt    Activity Tolerance Patient tolerated treatment well;Patient limited by fatigue    Behavior During Therapy WFL for tasks assessed/performed                 Past Medical History:  Diagnosis Date   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   ADHD (attention deficit hyperactivity disorder)    Allergy    Anxiety    Arthritis    Cataract    bil cateracts removed   Complication of anesthesia    first colonoscopy pt states she woke up   Depression    GERD (gastroesophageal reflux disease)    Globus sensation    Hyperlipidemia    Incontinence    bowels at times   Insomnia    Multinodular goiter (nontoxic)    Psoriatic arthritis (HCC)    Spinal stenosis of lumbar region 03/10/2015   MRI    Statin intolerance 01/27/2013   Past Surgical History:  Procedure Laterality Date   BACK SURGERY  02/17/2016   Dr. Leotis Shames- L4-L5 fusion    BIOPSY THYROID  2014   COLONOSCOPY     COLONOSCOPY W/ POLYPECTOMY     EYE SURGERY     bilateral cataract surgery w/ lens implant   FOOT SURGERY  2006   Right foot , secondary to severe loss of joint [facility])   POLYPECTOMY     TONSILLECTOMY     UPPER GASTROINTESTINAL ENDOSCOPY  2005   With empiric esophageal dilation   Patient Active Problem List   Diagnosis Date Noted   Osteopenia 11/13/2023   Carpal tunnel syndrome, bilateral 11/13/2023   Sore throat 10/26/2023    Unintentional Tylenol overdose, sequela 08/14/2023   Chronic pain not due to malignancy 08/14/2023   Slac (scapholunate advanced collapse) of wrist, left 06/03/2023   Urge incontinence 05/16/2022   Onychomycosis 08/17/2021   Drug allergy 06/29/2021   Fibromyalgia 06/29/2021   Generalized osteoarthritis 06/29/2021   Hand joint pain 06/29/2021   Other intervertebral disc degeneration, lumbar region 06/29/2021   Other long term (current) drug therapy 06/29/2021   Sciatica 01/28/2020   Rhinitis, non-allergic 11/10/2019   Insomnia 08/10/2019   Anxiety 10/11/2018   Carotid artery stenosis, asymptomatic, bilateral 10/29/2016   ADD (attention deficit disorder) 10/29/2016   Spondylolisthesis of lumbar region 02/17/2016   Globus sensation since at least 2005 04/29/2015   GERD (gastroesophageal reflux disease) 11/17/2014   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   Travel advice encounter 01/27/2013   Multinodular goiter (nontoxic) 08/11/2012   Overweight (BMI 25.0-29.9) 08/11/2012   Psoriatic arthritis, destructive type (HCC)    Screening for cervical cancer 08/21/2011   Screening for breast cancer 08/21/2011   Hx of adenomatous polyp of colon 10/06/2008   Familial hyperlipidemia 09/15/2008   Major depressive disorder, single episode, in  remission (HCC) 09/15/2008   ALLERGIC RHINITIS 09/15/2008    PCP: Dr. Duncan Dull   REFERRING PROVIDER: Duke Salvia NP   REFERRING DIAG: Spinal stenosis of lumbar region  Rationale for Evaluation and Treatment: Rehabilitation  THERAPY DIAG:  Other low back pain  Difficulty in walking, not elsewhere classified  Other lack of coordination  Other abnormalities of gait and mobility  ONSET DATE: 2017   PERTINENT HISTORY:  Pt reports that she has three different types of arthritis: osteo, rheumatoid, and psoriatic. She had lumbar fusion in 2017 which she felt did not help significantly. She had an infusion for her psoriatic arthritis this past spring and  it caused her low back pain. She recently had a spinal injection and this resolved radiating pain on her LLE, but she still has excruciating, radiating pain down her right lower extremity. She feels unsteady most of the time and she has to use walker to keep from falling. Pt has also been told that she needs a reverse total shoulder because right shoulder RTC damage.   SUBJECTIVE:                                                                                                                                                                                           SUBJECTIVE STATEMENT: Body feels tired and sluggish today  Felt off balance yesterday  Has something going on with mouth - tongue and throat feels like its all burning  Treated thrush with mouth wash a few weeks back, felt better after mouth wash treatment, but now the same pain is back (tried same mouth wash again and it isn't working) Having a hard time eating/swallowing (hard to eat anything except for yogurt/ice cream) Has tried everything she can that she has read online and nothing is helping (mouth rinses, rinsing with salt water)   PAIN:  Are you having pain?   NPRS: 6/10 pain, middle of low back to left hip (soreness in center of low back)   PRECAUTIONS: None  PATIENT GOALS: To improve balance and to decrease pain   OBJECTIVE  1 RM TESTING (calculated from 10RM or less):  Seated leg press, seat position 1, single leg (last tested 10/16/2023);  R: 39# L: 57#   TODAY'S TREATMENT:                                                         Therapeutic exercise: to centralize symptoms and improve ROM,  strength, muscular endurance, and activity tolerance required for successful completion of functional activities.   NuStep level 5 using bilateral upper and lower extremities. Seat/handle setting 9/11. For improved extremity mobility, muscular endurance, and activity tolerance; and to induce the analgesic effect of aerobic  exercise, stimulate improved joint nutrition, and prepare body structures and systems for following interventions. x 5  minutes. Average SPM = 69.   Therapeutic activities: dynamic activities for functional strengthening and improved functional activity tolerance.   Sidelying Hip Series to improve hip/core strength/endurance/coordination for improved walking tolerance            Flexion (x2) & Extension:   L: 1x10 (brief periodic rests throughout set)                     R: 1x10 (brief periodic rests throughout set)             Abduction:                     L: 1x10 (brief periodic rests throughout set)                    R: 1x10 (brief periodic rests throughout set)            Circles (CW & CCW):                     L: 1x10 each way (brief periodic rests throughout set)                    R: 1x10 each way (brief periodic rests throughout set)  Education on HEP including handout for hip series  Sit to stand with no UE support to 17 inch chair   2x10   Neuromuscular Re-education: to improve, balance, postural strength, muscle activation patterns, and stabilization strength required for functional activities:  Ball toss at [employer] with double leg stance on airex pad  1x2 (shifted airex pad forward, unable to get ball to reach the rebounder)  1x15 with pink ball   Pt required multimodal cuing for proper technique and to facilitate improved neuromuscular control, strength, range of motion, and functional ability resulting in improved performance and form.    PATIENT EDUCATION:  Education details: form and technique for correct performance of exercise.  Person educated: Patient Education method: Explanation, Demonstration, Verbal cues, and Handouts Education comprehension: verbalized understanding, returned demonstration, and verbal cues required  HOME EXERCISE PROGRAM: Access Code: [email or web address] URL: [email or web address] Date: 11/19/2023 Prepared by: Davonda Ausley  [place]  Exercises - Sit to Stand  - 3-4 x weekly - 2 sets - 7-10 reps - Standing March with Counter Support  - 3-4 x weekly - 3 sets - 10 reps - Standing Toe Taps  - 3-4 x weekly - 3 sets - 10 reps - Seated Single Arm Shoulder Row with Anchored Resistance  - 3-7 x weekly - 2-3 sets - 15 reps - 2 seconds hold - Sidelying Hip Flexion and Extension with Baby  - 1 x daily - 1 sets - 10 reps - Sidelying Hip Abduction  - 1 x daily - 1 sets - 10 reps - Sidelying Hip Circles  - 1 x daily - 1 sets - 10 reps  HOME EXERCISE PROGRAM [[id number]] View at [email or web address]" using code: [id number] Supine Marching (phase 2) - 'up up, down down' -  Repeat 10 Repetitions, Complete 2 Sets, Perform 2 Times a Day  SCIATIC NERVE GLIDE -  Repeat 15 Repetitions, Complete 1 Set, Perform 2    ASSESSMENT:  CLINICAL IMPRESSION: Patient arrives to session feeling sluggish and tired with pain from the center of her low back to her left hip. The focus of today's session was to progress functional strength of the lower extremities and exercises to challenge balance/stabilization. Patient experienced significant muscle fatigue today and had to take several rest breaks throughout exercises. Patient was able to complete all exercises with increased muscle fatigue but no increase in pain. Patient responded well to multimodal cuing for proper technique during sidelying hip series exercises to facilitate improved strength, range of motion, and functional ability resulting in improved form. Patient reported feeling tired at the end of session and about the same as she felt when she arrived overall. Patient advised to contact her primary care doctor to address the continued mouth/throat soreness. Patient would benefit from continued management of limiting condition by skilled physical therapist to address remaining impairments and functional limitations to work towards stated goals and return to PLOF or maximal functional independence.     OBJECTIVE IMPAIRMENTS: Abnormal gait, decreased balance, decreased endurance, decreased knowledge of use of DME, decreased mobility, difficulty walking, decreased ROM, decreased strength, hypomobility, impaired flexibility, impaired sensation, impaired UE functional use, and pain.   ACTIVITY LIMITATIONS: carrying, lifting, bending, standing, squatting, sleeping, stairs, bed mobility, bathing, toileting, dressing, reach over head, hygiene/grooming, and locomotion level  PARTICIPATION LIMITATIONS: cleaning, laundry, driving, shopping, community activity, and yard work  PERSONAL FACTORS: Past/current experiences, Time since onset of injury/illness/exacerbation, and 3+ comorbidities: psoriatic and rheumatoid arthritis,   are also affecting patient's functional outcome.   REHAB POTENTIAL: Fair chronicity of low back and multiple co-morbidities  CLINICAL DECISION MAKING: Evolving/moderate complexity  EVALUATION COMPLEXITY: Moderate   GOALS: Goals reviewed with patient? No  SHORT TERM GOALS: Target date: 06/05/2023  Pt will be independent with HEP in order to improve strength and balance in order to decrease fall risk and improve function at home and work. Baseline: NT 06/05/23: Able to perform independently  Goal status: Achieved   2.  Pt will increase by at least 0.13 m/s in order to demonstrate clinically significant improvement in community ambulation.  Baseline:  0.81 m/sec; 0.82 m/s (07/30/2023); 0.86 m/s (10/18/2023); Goal status: in progress  3.  Pt will increase by at least 63m (142ft) in order to demonstrate clinically significant improvement in cardiopulmonary endurance and community ambulation Baseline:  800 ft; 805 feet with SPC (07/30/2023); 831 feet with SPC (10/17/2022); Goal status: In progress  4.  Patient will show correct use of single point cane and rollator for improved ambulation and to decrease risk of falling.  Baseline: NT 06/04/23: Using SPC  Goal  status: ACHIEVED    LONG TERM GOALS: Target date: 07/31/2023. Target date updated to 10/22/2023 for all unmet goals on 07/30/2023. Target date updated to 12/27/2023 for all unmet goals on 10/18/2023  Patient will have improved function and activity level as evidenced by an increase in FOTO score by 10 points or more.  Baseline: 40/100 with target of 47: 39 at visit #10 (07/30/2023); 48 at visit #20 (10/18/23); Goal status: MET  2.  Patient will ambulate >=1000 ft for as evidence of improved aerobic endurance and lumbar function.  Baseline: 800 ft; 805 feet with SPC (07/30/2023); 831 feet with SPC (10/17/2022); Goal status: In-progress  3. Patient will perform 10 meter walk test in >=1 m/sec for improved mobility and to decrease her risk of falling. Baseline:  0.81 m/sec; 0.82 m/s (07/30/2023); 0.86 m/s (10/18/2023);  Goal status: In progress  4.  Patient will demonstrate an improvement in DGI score of >=2 pts as evidence of improved dynamic balance to decrease risk of falling (Pardasaney et al, 2012). Baseline: 14/24; 15/24 (07/30/2023); 17/24 (10/18/2023); Goal status: MET   PLAN:  PT FREQUENCY: 1-2x/week  PT DURATION: 10 weeks  PLANNED INTERVENTIONS: Therapeutic exercises, Therapeutic activity, Neuromuscular re-education, Balance training, Gait training, Patient/Family education, Self Care, Joint mobilization, Joint manipulation, Stair training, Vestibular training, Canalith repositioning, DME instructions, Aquatic Therapy, Dry Needling, Electrical stimulation, Spinal manipulation, Spinal mobilization, Cryotherapy, Moist heat, Traction, Ultrasound, Manual therapy, and Re-evaluation.  PLAN FOR NEXT SESSION: update HEP as appropriate, progressive LE/trunk/functional strengthening, motor control, and dynamic balance exercises.    Shakiah Wester [place], SPT [employer] DPTE  Huntley Dec R. Ilsa Iha, PT, DPT, Cert. MDT 11/19/23, 5:37 PM  [facility] [address] [place], [place] [address] P: [phone] I F: [phone]

## 2023-11-20 NOTE — Progress Notes (Unsigned)
Referring Physician:  Sherlene Shams, MD [address] [place],  [place] [address]  Primary Physician:  Sherlene Shams, MD  History of Present Illness: 11/21/2023 [name] is here today with a chief complaint of left-sided carpal tunnel syndrome.  She has been dealing with this for some time and has had worsening when she had a wrist trauma.  She states it is numb and tingling almost all the time.  It does not get worse when she is sleeping but does get exacerbated by certain wrist positions.  She did have evaluation for the trauma which did not show any acute fracture.  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  Her workup includes an EMG.  She also complains of back and lower extremity pain that has been chronic.  She has a previous spinal fusion.  She has pain that goes down into her right leg predominantly in the down into her calf.  This been going on for many years and has been progressively worsening.  She has had physical therapy for her back and continues to work with them.  She has tried medical therapy as well.  She is also had some spinal injections.  She like to have a more regular relationship with the pain physician.  Conservative measures  physical therapy: currently doing PT at [facility] for her back but has not been discharged, has helped some with her core strength Multimodal medical therapy including regular antiinflammatories:  tylenol, celebrex, diclofenac gel, cymbalta, oxycodone Injections:  She has had some injections in her left hand that helped some, but took about 4 weeks to notice.   lumbar epidural steroid injections in 2017 before surgery and most recently about 6 months ago June2024([facility]), [facility])  Past Surgery: Lumbar Surgery in 2017 by Dr. Leotis Shames  The symptoms are causing a significant impact on the patient's life.   I have utilized the care everywhere function in  epic to review the outside records available from external health systems.  Review of Systems:  A 10 point review of systems is negative, except for the pertinent positives and negatives detailed in the HPI.  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 Leotis Shames- L4-L5 fusion    BIOPSY THYROID  2014   COLONOSCOPY     COLONOSCOPY W/ POLYPECTOMY     EYE SURGERY     bilateral cataract surgery w/ lens implant   FOOT SURGERY  2006   Right foot , secondary to severe loss of joint [facility])   POLYPECTOMY     TONSILLECTOMY     UPPER GASTROINTESTINAL ENDOSCOPY  2005  With empiric esophageal dilation    Allergies: Allergies as of 11/21/2023 - Review Complete 11/21/2023  Allergen Reaction Noted   Cosentyx [secukinumab] Rash 01/20/2019   Sertraline hcl Other (See Comments) 09/15/2008   Adalimumab  02/07/2023   Avelox [moxifloxacin hcl in nacl]  08/21/2011   Nitrofurantoin  09/15/2008   Pregabalin  05/15/2023   Rosuvastatin  05/25/2021   Latex Itching and Rash 02/08/2016   Macrobid [nitrofurantoin monohyd macro] Other (See Comments) 07/18/2017   Statins Other (See Comments) 08/21/2011    Medications:  Current Outpatient Medications:    acetaminophen (TYLENOL) 500 MG tablet, Take 1,000 mg by mouth every 6 (six) hours as needed., Disp: ,  Rfl:    aspirin 81 MG tablet, Take 81 mg by mouth daily., Disp: , Rfl:    b complex vitamins capsule, Take 1 capsule by mouth daily., Disp: , Rfl:    calcium carbonate (OS-CAL - DOSED IN MG OF ELEMENTAL CALCIUM) 1250 (500 Ca) MG tablet, , Disp: , Rfl:    celecoxib (CELEBREX) 200 MG capsule, Take 1 capsule (200 mg total) by mouth 2 (two) times daily., Disp: 180 capsule, Rfl: 1   cholecalciferol (VITAMIN D) 1000 UNITS tablet, Take 2,000 Units by mouth daily., Disp: , Rfl:    clobetasol (TEMOVATE) 0.05 % external solution, as needed., Disp: , Rfl:    cyanocobalamin (VITAMIN B12) 1000 MCG tablet, Take 1,000 mcg by mouth daily., Disp: , Rfl:    diclofenac Sodium (VOLTAREN) 1 % GEL, APPLY 2 GRAMS TO AFFECTED AREA 4 TIMES A DAY, Disp: 300 g, Rfl: 3   DULoxetine (CYMBALTA) 60 MG capsule, Take 2 capsules (120 mg total) by mouth daily., Disp: 180 capsule, Rfl: 0   fluocinonide ointment (LIDEX) 0.05 %, APPLY TWICE DAILY TO AFFECTED AREAS UNTIL IMPROVED THEN AS NEEDED FOR FLARES, Disp: , Rfl:    leflunomide (ARAVA) 20 MG tablet, Take 20 mg by mouth daily., Disp: , Rfl:    lisdexamfetamine (VYVANSE) 50 MG capsule, Take 1 capsule (50 mg total) by mouth daily., Disp: 30 capsule, Rfl: 0   Magnesium 250 MG CAPS, Take by mouth. Take two by mouth daily, Disp: , Rfl:    melatonin 5 MG TABS, Take 5 mg by mouth at bedtime., Disp: , Rfl:    Multiple Vitamin (MULTIVITAMIN) tablet, Take 1 tablet by mouth daily., Disp: , Rfl:    nystatin-triamcinolone ointment (MYCOLOG), Apply 1 application topically 2 (two) times daily., Disp: 30 g, Rfl: 1   Omega-3 Fatty Acids (FISH OIL) 1200 MG CAPS, Take 1,200 mg by mouth daily. , Disp: , Rfl:    oxyCODONE-acetaminophen (PERCOCET/ROXICET) 5-325 MG tablet, Take 1 tablet by mouth every 6 (six) hours as needed for moderate pain (pain score 4-6). Do not refill less than 30 days from prior refill, Disp: 120 tablet, Rfl: 0   oxyCODONE-acetaminophen (PERCOCET/ROXICET) 5-325 MG tablet, Take 1  tablet by mouth every 6 (six) hours as needed for moderate pain (pain score 4-6). Do not refill less than 30 days from prior refill, Disp: 120 tablet, Rfl: 0   Polyethyl Glycol-Propyl Glycol 0.4-0.3 % SOLN, Place 1 drop into both eyes daily as needed (for dry eyes)., Disp: , Rfl:    Probiotic Product (PROBIOTIC DAILY PO), Take 1 capsule by mouth daily., Disp: , Rfl:    solifenacin (VESICARE) 10 MG tablet, TAKE 1 TABLET BY MOUTH EVERY DAY, Disp: 90 tablet, Rfl: 1   triamcinolone cream (KENALOG) 0.1 %, APPLY TO AFFECTED AREA TWICE A DAY UNTIL CLEAR THEN AS  NEEDED, Disp: , Rfl:    trospium (SANCTURA) 20 MG tablet, Take 20 mg by mouth 2 (two) times daily., Disp: , Rfl:    Turmeric 500 MG TABS, Take 1 tablet by mouth daily., Disp: , Rfl:   Social History: Social History   Tobacco Use   Smoking status: Former    Current packs/day: 0.00    Types: Cigarettes    Quit date: 08/21/1971    Years since quitting: 52.2    Passive exposure: Never   Smokeless tobacco: Never  Vaping Use   Vaping status: Never Used  Substance Use Topics   Alcohol use: Yes    Alcohol/week: 7.0 standard drinks of alcohol    Types: 7 Glasses of wine per week    Comment: daily,wine   Drug use: No    Family Medical History: Family History  Problem Relation Age of Onset   Heart disease Mother    Hyperlipidemia Mother    Coronary artery disease Father 63       CABG in early 70's   Hyperlipidemia Father    Epilepsy Grandchild        Severe form   Colon cancer Neg Hx    Esophageal cancer Neg Hx    Rectal cancer Neg Hx    Stomach cancer Neg Hx    Colon polyps Neg Hx    Crohn's disease Neg Hx     Physical Examination: Vitals:   11/21/23 1125  BP: 120/68    General: Patient is in no apparent distress. Attention to examination is appropriate.  Neck:   Supple.  Full range of motion.  Respiratory: Patient is breathing without any difficulty.   NEUROLOGICAL:     Awake, alert, oriented to person, place, and  time.  Speech is clear and fluent.   Cranial Nerves: Pupils equal round and reactive to light.  Facial tone is symmetric.  Facial sensation is symmetric. Shoulder shrug is symmetric. Tongue protrusion is midline.    Strength: She has some deformity noted in her hands and wrist, this is been well-known and she was previously diagnosed with a progressive arthritis.  She has weakness in her thenar muscles on the left more than the right.  Lower extremities are without major deficit noted.  Decrease in the median nerve distribution.  No evidence of dysmetria noted.  Gait is antalgic and cane assisted  Imaging: Her imaging has been reviewed from the outside records, has an L4-S1 spinal fusion with significant scoliotic deformity above the level of her fusions.  I also reviewed her c CT mild gram.  Showed multilevel spondylitic changes with foraminal stenosis at multiple levels secondary to both disc bulges as well as the coronal deformities and alignment issues.  I reviewed her wrist x-ray which did not demonstrate any evidence of acute fracture but did show an SLAC deformity  I have personally reviewed the images and agree with the above interpretation.  Medical Decision Making/Assessment and Plan: Ms. Thorns is a pleasant 68 y.o. female with referral to me for 2 current issues.  1 was for her carpal tunnel syndrome on the left which is moderate to severe in severity in the setting of a previous wrist trauma and SLAC deformity.  She was also referred for significant chronic back pain  For the carpal tunnel syndrome, she has symptoms that are consistent with a median neuropathy at the wrist, this is likely exacerbated by her previous trauma without any bony fractures.  We reviewed her imaging as well as  her EMG.  At this time she would likely improve or have benefit from a median nerve decompression at the wrist, however given her SLAC deformity I will plan to reach out to the hand surgery team to  discuss any coexisting issues with her bony abnormalities and potential ganglion cysts.  In regards to her spine issues, she has a chronic history of back pain she has been working with physical therapy she has had some intermittent injections at various practices.  At this time we will plan on making referral to our pain team so she can have a consistent injection team for evaluation and treatment.  We discussed that she does have significant amount of spondylosis noted her CT scan as well as her CT myelogram showed multiple evidence of both nerve compression as well as disc disease above the level of her previous fusion.  She will likely benefit someday from a spinal reconstruction type surgery, however she would like to avoid spinal surgery at this time.  I would like to have her reviewed by our pain team for any injection opportunities given the T2 signal noted in her joints and possible consideration for a spinal cord stimulator as she would like to avoid a another fusion surgery if possible.  I like to get flexion-extension x-rays to rule out any instability, once these are read will reach out to the patient for further planning.  Thank you for involving me in the care of this patient.    Lovenia Kim MD/[facility] Neurosurgery

## 2023-11-21 ENCOUNTER — Inpatient Hospital Stay
Admission: RE | Admit: 2023-11-21 | Discharge: 2023-11-21 | Disposition: A | Discharge status: Home / Self Care | Payer: Self-pay | Source: Ambulatory Visit | Attending: Neurosurgery | Admitting: Neurosurgery

## 2023-11-21 ENCOUNTER — Ambulatory Visit: Payer: Medicare Other | Admitting: Neurosurgery

## 2023-11-21 ENCOUNTER — Ambulatory Visit
Admission: RE | Admit: 2023-11-21 | Discharge: 2023-11-21 | Disposition: A | Discharge status: Home / Self Care | Payer: Medicare Other | Attending: Neurosurgery | Admitting: Neurosurgery

## 2023-11-21 ENCOUNTER — Ambulatory Visit
Admission: RE | Admit: 2023-11-21 | Discharge: 2023-11-21 | Disposition: A | Discharge status: Home / Self Care | Payer: Medicare Other | Source: Ambulatory Visit | Attending: Neurosurgery | Admitting: Neurosurgery

## 2023-11-21 ENCOUNTER — Ambulatory Visit: Payer: Self-pay | Admitting: Physical Therapy

## 2023-11-21 ENCOUNTER — Other Ambulatory Visit: Payer: Self-pay

## 2023-11-21 VITALS — BP 120/68 | Ht 65.0 in | Wt 146.0 lb

## 2023-11-21 DIAGNOSIS — G5602 Carpal tunnel syndrome, left upper limb: Secondary | ICD-10-CM | POA: Diagnosis not present

## 2023-11-21 DIAGNOSIS — M47816 Spondylosis without myelopathy or radiculopathy, lumbar region: Secondary | ICD-10-CM

## 2023-11-21 DIAGNOSIS — M8580 Other specified disorders of bone density and structure, unspecified site: Secondary | ICD-10-CM

## 2023-11-21 DIAGNOSIS — M4316 Spondylolisthesis, lumbar region: Secondary | ICD-10-CM | POA: Insufficient documentation

## 2023-11-21 DIAGNOSIS — Z049 Encounter for examination and observation for unspecified reason: Secondary | ICD-10-CM

## 2023-11-21 NOTE — Patient Instructions (Signed)
Is a pleasure seeing you at [facility] neurosurgery in [place].  We discussed both your carpal tunnel syndrome as well as your degeneration and deformity in your back.  As far as the carpal tunnel goes I reached out to one of our hand surgeons to discuss your case given the SLAC deformity.  I will reach out to you once I hear back from him.  In regards to your back pain I will make a referral over to the pain team who can evaluate you for joint injections RFA and potentially spinal stimulator if you are concerned about moving forward with a spinal reconstruction type surgery.  Will plan on getting flexion-extension x-rays today to evaluate whether or not you have any excess mobility in the setting of your scoliosis.  Continue with your physical therapy and your pain team.  We will continue to follow along with you and work with you to help both of these issues.

## 2023-11-23 ENCOUNTER — Inpatient Hospital Stay
Admission: RE | Admit: 2023-11-23 | Discharge: 2023-11-23 | Disposition: A | Discharge status: Home / Self Care | Payer: Self-pay | Source: Ambulatory Visit | Attending: Neurosurgery | Admitting: Neurosurgery

## 2023-11-23 DIAGNOSIS — Z049 Encounter for examination and observation for unspecified reason: Secondary | ICD-10-CM

## 2023-11-23 NOTE — Progress Notes (Signed)
I will call [facility] on Monday to get the additional images in the system.

## 2023-11-25 ENCOUNTER — Other Ambulatory Visit: Payer: Self-pay | Admitting: Internal Medicine

## 2023-11-25 ENCOUNTER — Other Ambulatory Visit: Payer: Self-pay | Admitting: Psychiatry

## 2023-11-25 DIAGNOSIS — G8929 Other chronic pain: Secondary | ICD-10-CM

## 2023-11-25 DIAGNOSIS — F3341 Major depressive disorder, recurrent, in partial remission: Secondary | ICD-10-CM

## 2023-11-27 ENCOUNTER — Ambulatory Visit: Payer: Medicare Other | Admitting: Physical Therapy

## 2023-11-28 ENCOUNTER — Encounter: Payer: Medicare HMO | Admitting: Physical Therapy

## 2023-11-28 ENCOUNTER — Ambulatory Visit
Admission: RE | Admit: 2023-11-28 | Discharge: 2023-11-28 | Disposition: A | Discharge status: Home / Self Care | Payer: Medicare Other | Source: Ambulatory Visit | Attending: Pain Medicine | Admitting: Pain Medicine

## 2023-11-28 ENCOUNTER — Ambulatory Visit: Payer: Medicare Other | Admitting: Pain Medicine

## 2023-11-28 ENCOUNTER — Encounter: Payer: Self-pay | Admitting: Pain Medicine

## 2023-11-28 VITALS — BP 117/58 | HR 101 | Temp 97.2°F | Resp 18 | Ht 65.0 in | Wt 143.0 lb

## 2023-11-28 DIAGNOSIS — M4726 Other spondylosis with radiculopathy, lumbar region: Secondary | ICD-10-CM

## 2023-11-28 DIAGNOSIS — M79604 Pain in right leg: Secondary | ICD-10-CM | POA: Insufficient documentation

## 2023-11-28 DIAGNOSIS — G8929 Other chronic pain: Secondary | ICD-10-CM | POA: Insufficient documentation

## 2023-11-28 DIAGNOSIS — M961 Postlaminectomy syndrome, not elsewhere classified: Secondary | ICD-10-CM | POA: Insufficient documentation

## 2023-11-28 DIAGNOSIS — M79605 Pain in left leg: Secondary | ICD-10-CM | POA: Insufficient documentation

## 2023-11-28 DIAGNOSIS — M48061 Spinal stenosis, lumbar region without neurogenic claudication: Secondary | ICD-10-CM | POA: Insufficient documentation

## 2023-11-28 DIAGNOSIS — G5610 Other lesions of median nerve, unspecified upper limb: Secondary | ICD-10-CM | POA: Insufficient documentation

## 2023-11-28 DIAGNOSIS — M159 Polyosteoarthritis, unspecified: Secondary | ICD-10-CM | POA: Insufficient documentation

## 2023-11-28 DIAGNOSIS — M25551 Pain in right hip: Secondary | ICD-10-CM | POA: Insufficient documentation

## 2023-11-28 DIAGNOSIS — G894 Chronic pain syndrome: Secondary | ICD-10-CM | POA: Diagnosis not present

## 2023-11-28 DIAGNOSIS — M47816 Spondylosis without myelopathy or radiculopathy, lumbar region: Secondary | ICD-10-CM

## 2023-11-28 DIAGNOSIS — M25651 Stiffness of right hip, not elsewhere classified: Secondary | ICD-10-CM | POA: Insufficient documentation

## 2023-11-28 DIAGNOSIS — M7989 Other specified soft tissue disorders: Secondary | ICD-10-CM | POA: Insufficient documentation

## 2023-11-28 DIAGNOSIS — Z79899 Other long term (current) drug therapy: Secondary | ICD-10-CM | POA: Insufficient documentation

## 2023-11-28 DIAGNOSIS — Z789 Other specified health status: Secondary | ICD-10-CM | POA: Insufficient documentation

## 2023-11-28 DIAGNOSIS — M5416 Radiculopathy, lumbar region: Secondary | ICD-10-CM | POA: Insufficient documentation

## 2023-11-28 DIAGNOSIS — M48062 Spinal stenosis, lumbar region with neurogenic claudication: Secondary | ICD-10-CM | POA: Insufficient documentation

## 2023-11-28 DIAGNOSIS — M5136 Other intervertebral disc degeneration, lumbar region with discogenic back pain only: Secondary | ICD-10-CM | POA: Insufficient documentation

## 2023-11-28 DIAGNOSIS — M25552 Pain in left hip: Secondary | ICD-10-CM | POA: Diagnosis present

## 2023-11-28 DIAGNOSIS — M5417 Radiculopathy, lumbosacral region: Secondary | ICD-10-CM | POA: Insufficient documentation

## 2023-11-28 DIAGNOSIS — M25652 Stiffness of left hip, not elsewhere classified: Secondary | ICD-10-CM | POA: Insufficient documentation

## 2023-11-28 DIAGNOSIS — M899 Disorder of bone, unspecified: Secondary | ICD-10-CM | POA: Insufficient documentation

## 2023-11-28 DIAGNOSIS — G5603 Carpal tunnel syndrome, bilateral upper limbs: Secondary | ICD-10-CM | POA: Insufficient documentation

## 2023-11-28 DIAGNOSIS — M545 Low back pain, unspecified: Secondary | ICD-10-CM | POA: Insufficient documentation

## 2023-11-28 DIAGNOSIS — M51362 Other intervertebral disc degeneration, lumbar region with discogenic back pain and lower extremity pain: Secondary | ICD-10-CM | POA: Insufficient documentation

## 2023-11-28 DIAGNOSIS — R937 Abnormal findings on diagnostic imaging of other parts of musculoskeletal system: Secondary | ICD-10-CM | POA: Insufficient documentation

## 2023-11-28 DIAGNOSIS — M4186 Other forms of scoliosis, lumbar region: Secondary | ICD-10-CM | POA: Insufficient documentation

## 2023-11-28 DIAGNOSIS — R94130 Abnormal response to nerve stimulation, unspecified: Secondary | ICD-10-CM | POA: Insufficient documentation

## 2023-11-28 DIAGNOSIS — M810 Age-related osteoporosis without current pathological fracture: Secondary | ICD-10-CM | POA: Insufficient documentation

## 2023-11-28 DIAGNOSIS — G5622 Lesion of ulnar nerve, left upper limb: Secondary | ICD-10-CM | POA: Insufficient documentation

## 2023-11-28 NOTE — Progress Notes (Signed)
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 previously 6 mm. 3 mm anterolisthesis of L5 on S1 relatively stable. Vertebral bodies otherwise normally aligned.  Vertebrae: Postoperative changes from interval posterior decompression with fusion is seen at L4 thru S1. Specifically, the patient has undergone bilateral L5 laminectomy with bilateral L4-5 laminotomy/foraminotomy with posterior fusion. Bilateral transpedicular screws in place at L4 through S1. Interbody fusion devices in place at L4-5 and L5-S1 as well. Hardware appears well positioned. Postoperative seroma at the laminectomy site measures 3.4 x 3.8 x 3.1 cm. No concerning features for infection. Additional small amount of fluid loculated along the posterior midline incision. Normal expected postoperative enhancement within the adjacent paraspinous soft tissues.  Vertebral body heights ir maintained. No fracture. Signal intensity within the vertebral body bone  marrow is within normal limits.  Conus medullaris: Extends to the L1-2 level and appears normal.  Paraspinal and other soft tissues: Scattered T2 hyperintense simple cysts noted within the right kidney. Other than the postoperative changes within the lower back as discussed previously, paraspinous soft tissues demonstrate no acute abnormality.  Disc levels:  T11-12: Seen only on sagittal projection. Degenerative disc bulge with disc desiccation. No stenosis.  T12-L1: Mild degenerative disc bulge with disc desiccation. Superimposed shallow right subarticular disc protrusion flattens the ventral thecal sac, stable. No significant canal or foraminal stenosis.  L1-2: Diffuse degenerative intervertebral disc space narrowing with disc desiccation and disc bulge. Mild reactive endplate changes, similar to previous. Mild facet hypertrophy. No significant canal or foraminal stenosis.  L2-3: Diffuse degenerative disc bulge with disc desiccation and intervertebral disc space narrowing. Similar reactive endplate changes with endplate osteophytic spurring. Bilateral facet and ligamentum flavum hypertrophy, greater on the left. No significant canal or foraminal stenosis.  L3-4: Diffuse disc bulge with disc desiccation and intervertebral disc space narrowing. There is a superimposed broad left foraminal disc  protrusion, new/ increased in prominence from previous (series 5, image 20). This contacts the exiting left L3 nerve root in the left neural foramen, and could result in left lower extremity radicular symptoms (series 3, image 12). Superimposed facet arthrosis with ligamentum flavum hypertrophy. Associated left lateral recess stenosis is slightly worsened as compared to previous. Similarly, mild to moderate left foraminal narrowing also slightly progressed. Otherwise stable canal and right lateral recess stenosis. Additional small right foraminal disc protrusion is similar to previous  (series 5, image 20). This closely approximates the right L3 nerve root without frank neural impingement. The  L4-5: Status post posterior decompression with fusion. Thecal sac is widely patent without residual stenosis. No appreciable foraminal encroachment.  L5-S1: Status post decompression with fusion. Postoperative seroma flattens the posterior thecal sac without significant canal stenosis. Small amount of enhancing postoperative granulation tissue within the left epidural space, abutting the descending left S1 nerve root. No significant foraminal encroachment.  IMPRESSION: 1. Postoperative changes from interval posterior spinal decompression with fusion at L4-5 and L5-S1 without complication. No significant residual stenosis at these levels. 2. New broad left foraminal disc protrusion at L3-4, contacting the exiting left L3 nerve root in the left neural foramen. Mild left lateral recess stenosis and left foraminal narrowing has slightly worsened as compared to previous exam. 3. Additional small right foraminal disc protrusion at L3-4, closely approximating the exiting right L3 nerve root. 4. Stable degenerative changes at L1-2 and L2-3 without significant stenosis or evidence for impingement. 5. Slightly improved anterolisthesis at L4-5 relative to preoperative exam, now measuring 4 mm. Stable dextroscoliosis.   Electronically Signed By: Rise Mu M.D. On: 08/11/2016 15:09  Lumbar CT w contrast: Results for orders placed during the hospital encounter of 02/15/23 CT LUMBAR SPINE W CONTRAST  Narrative CLINICAL DATA:  Chronic, progressively worsening low back pain, worse on the right. History of prior fusion in 2017.  EXAM: LUMBAR MYELOGRAM  CT LUMBAR MYELOGRAM  FLUOROSCOPY: Radiation Exposure Index (as provided by the fluoroscopic device): 19.3 mGy Kerma  PROCEDURE: After thorough discussion of risks and benefits of the procedure including bleeding,  infection, injury to nerves, blood vessels, adjacent structures as well as headache and CSF leak, written and oral informed consent was obtained. Consent was obtained by Dr. Malachi Pro. Time out form was completed.  Patient was positioned prone on the fluoroscopy table. Local anesthesia was provided with 1% lidocaine without epinephrine after prepped and draped in the usual sterile fashion. Puncture was performed at L1-L2 using a 3 1/2 inch 22-gauge spinal needle via right interlaminar approach. Using a single pass through the dura, the needle was placed within the thecal sac, with return of clear CSF. 15 mL of Isovue M-200 was injected into the thecal sac, with normal opacification of the nerve roots and cauda equina consistent with free flow within the subarachnoid space.  I personally performed the lumbar puncture and administered the intrathecal contrast. I also personally supervised acquisition of the myelogram images.  TECHNIQUE: Contiguous axial images were obtained through the lumbar spine after the intrathecal infusion of contrast. Coronal and sagittal reconstructions were obtained of the axial image sets.  COMPARISON:  Lumbar spine x-rays dated August 13, 2018. CT lumbar myelogram dated July 18, 2017.  FINDINGS: LUMBAR MYELOGRAM FINDINGS:  Prior L4-S1 PLIF. Hardware is intact. Progressive dextroscoliosis. Chronic fused mild anterolisthesis at L4-L5 and L5-S1. No dynamic instability.  Small posterior disc osteophyte complexes from L1-L2 through L3-L4. Mild spinal canal stenosis at L1-L2 and L2-L3.  Mild-to-moderate spinal canal stenosis at L3-L4.  CT LUMBAR MYELOGRAM FINDINGS:  Segmentation: Standard.  Alignment: Progressive dextroscoliosis. Unchanged mild fused anterolisthesis at L4-L5 and L5-S1.  Vertebrae: Acute nondisplaced fracture of the right L1 transverse process. Chronic mild T12 inferior endplate compression deformity, new since 2018. Prior  L4-S1 PLIF with interval solid interbody and posterior osseous fusion. Unchanged mild lucency surrounding the bilateral S1 pedicle screws.  Conus medullaris and cauda equina: Conus extends to the L1-L2 level. Conus and cauda equina appear normal.  Paraspinal and other soft tissues: Aortoiliac atherosclerotic vascular disease.  Disc levels:  T11-T12: Mild disc bulging and bilateral facet arthropathy. Mild right neuroforaminal stenosis. No spinal canal or left neuroforaminal stenosis.  T12-L1: Progressive diffuse disc bulging eccentric to the right. Large right subarticular disc extrusion/extruded disc fragment migrating inferiorly posterior to the L1 vertebral body. Smaller left subarticular extruded disc material with vacuum phenomenon posterior to the L1 vertebral body, potentially arising from the T12-L1 or L1-L2 disc. Large right far lateral disc extrusion migrating superiorly. Mild bilateral facet arthropathy. Mild-to-moderate spinal canal and right lateral recess stenosis. Mild left lateral recess stenosis. Mild right neuroforaminal stenosis. No left neuroforaminal stenosis. Findings have progressed since 2018.  L1-L2: Small circumferential disc osteophyte complex moderate left and mild right facet arthropathy. Mild-to-moderate left lateral recess stenosis. Mild bilateral neuroforaminal stenosis. No spinal canal stenosis. Findings have progressed since 2018.  L2-L3: Mild disc bulging and endplate spurring eccentric to the left. Small amount of air in the left inferior subarticular base posterior to the L3 vertebral body, presumably within extruded disc. Moderate to severe left and mild right facet arthropathy. Mild left lateral recess and neuroforaminal stenosis. No spinal canal or right neuroforaminal stenosis. Findings have progressed since 2018.  L3-L4: Circumferential disc osteophyte complex with severe right and moderate to severe left facet arthropathy. Moderate  spinal canal stenosis. Mild to moderate right and mild left lateral recess stenosis. Moderate left and mild right neuroforaminal stenosis. Findings have progressed since 2018.  L4-L5: Prior posterior decompression and PLIF. No residual stenosis.  L5-S1: Prior posterior decompression and PLIF. No residual stenosis.  IMPRESSION: 1. Prior L4-S1 PLIF with interval solid interbody and posterior osseous fusion. Unchanged mild lucency surrounding the bilateral S1 pedicle screws, concerning for loosening. No residual stenosis. 2. Progressive multilevel lumbar spondylosis as described above. Mild-to-moderate spinal canal and right lateral recess stenosis at T12-L1. Moderate spinal canal and left neuroforaminal stenosis at L3-L4. 3. Acute nondisplaced fracture of the right L1 transverse process. 4. Chronic mild T12 inferior endplate compression deformity, new since 2018. 5.  Aortic Atherosclerosis (ICD10-I70.0).   Electronically Signed By: Obie Dredge M.D. On: 02/15/2023 14:26  Lumbar DG 2-3 views: Results for orders placed during the hospital encounter of 02/17/16 DG Lumbar Spine 2-3 Views  Narrative CLINICAL DATA:  Status post lower lumbar posterior fusion  EXAM: DG C-ARM 61-120 MIN; LUMBAR SPINE - 2-3 VIEW  COMPARISON:  Intraoperative images obtained earlier in the day and July 16, 2015  FLUOROSCOPY TIME:  0 minutes 56 seconds; 2 submitted images  FINDINGS: Frontal and lateral view show pedicle screws bilaterally at L4, L5, and S1 as well as disc spacers at L4-5 and L5-S1. The visualized support hardware appears intact on submitted images. No fracture. Stable slight anterolisthesis of L5 on S1.  IMPRESSION: Support hardware intact as described. No fracture evident. There is stable slight anterolisthesis of L5 on S1.   Electronically Signed By: Bretta Bang III M.D. On: 02/17/2016 12:30  Lumbar DG Myelogram: Results  for orders placed during the hospital  encounter of 07/18/17 DG MYELOGRAPHY LUMBAR INJ MULTI REGION  Narrative CLINICAL DATA:  Low back pain and right leg pain and numbness. Vibration sensation. Cervical region pain on the left when turning the head to the right. 1 minutes 44 seconds. 485.26 micro gray meter squared  FLUOROSCOPY TIME:  dictate in minutes and seconds  PROCEDURE: LUMBAR PUNCTURE FOR CERVICAL AND LUMBAR MYELOGRAM  CERVICAL AND LUMBAR MYELOGRAM  CT CERVICAL MYELOGRAM  CT LUMBAR MYELOGRAM  After thorough discussion of risks and benefits of the procedure including bleeding, infection, injury to nerves, blood vessels, adjacent structures as well as headache and CSF leak, written and oral informed consent was obtained. Consent was obtained by Dr. Paulina Fusi.  Patient was positioned prone on the fluoroscopy table. Local anesthesia was provided with 1% lidocaine without epinephrine after prepped and draped in the usual sterile fashion. Puncture was performed at the L1-2 using a 3 1/2 inch 22-gauge spinal needle via left para median approach. Using a single pass through the dura, the needle was placed within the thecal sac, with return of clear CSF. 10 mL of Omnipaque-300 was injected into the thecal sac, with normal opacification of the nerve roots and cauda equina consistent with free flow within the subarachnoid space. The patient was then moved to the trendelenburg position and contrast flowed into the Cervical spine region.  I personally performed the lumbar puncture and administered the intrathecal contrast. I also personally performed acquisition of the myelogram images.  TECHNIQUE: Contiguous axial images were obtained through the Cervical and Lumbar spine after the intrathecal infusion of infusion. Coronal and sagittal reconstructions were obtained of the axial image sets.  FINDINGS: CERVICAL AND LUMBAR MYELOGRAM FINDINGS:  There is curvature convex to the right. There is disc  space narrowing at L1-2 with osteophytic ridging and mild narrowing of the canal. L2-3 shows similar osteophytic ridging with mild narrowing of the canal. L3-4 shows stenosis worse on the right than the left which could cause right-sided neural compression. From L4-S1, there has been previous decompression, diskectomy and fusion procedure with pedicle screws and posterior rods. There appears to be some lucency around the screws. See results of CT. There is no compressive canal stenosis or apparent neural compression in that segment. Standing flexion extension views do not demonstrate any subluxation.  In the cervical region, there is fairly low opacity. No evidence of central canal stenosis. Small anterior extradural defects from C3-4 through C6-7. See results of CT.  CT CERVICAL MYELOGRAM FINDINGS:  Foramen magnum is widely patent.  C1-2 is normal.  C2-3 is normal.  C3-4: Facet arthropathy on the right with 2 mm of anterolisthesis. Bulging of the disc. Uncovertebral osteophytes right more than left. Right foraminal narrowing that could affect the right C4 nerve.  C4-5: Mild facet osteoarthritis. Degenerative spondylosis with endplate osteophytes but no soft disc herniation. No central canal stenosis. Foraminal stenosis left worse than right. Left C5 nerve could be compressed.  C5-6: Spondylosis with endplate osteophytes. No compressive central canal stenosis. Mild bilateral bony foraminal narrowing.  C6-7: No disc pathology. Mild facet arthritis on the left. No stenosis.  C7-T1: No disc pathology. Mild facet arthritis on the left. No stenosis.  CT LUMBAR MYELOGRAM FINDINGS:  T12-L1:  Mild bulging of the disc.  No stenosis.  L1-2: Disc degeneration with vacuum phenomenon. Endplate osteophytes and bulging of the disc. Mild stenosis of both lateral recesses left more than right. No definite neural compression.  L2-3: Disc degeneration  with vacuum phenomenon and loss of  height. Endplate osteophytes and bulging of the disc. Mild facet hypertrophy. Mild narrowing of the lateral recesses left more than right but no definite neural compression.  L3-4: Disc degeneration of vacuum phenomenon and bulging of the disc. Bilateral facet arthropathy right worse than left. Stenosis of both lateral recesses right worse than left. Neural compression could occur at this level, particularly affecting the right L4 nerve root.  L4-5: Previous posterior decompression, diskectomy and fusion procedure. L4 is 4 mm anterior relative to L5. Interbody fusion material satisfactorily positioned. No lucency around the L4 or L5 screws presently. No compressive canal or foraminal stenosis.  L5-S1: Previous posterior decompression, diskectomy and fusion procedure. L5 is 4 mm anterior relative to S1. Interbody fusion material satisfactorily positioned. No lucency around the L5 screws. Some lucency around the sacral screws. This suggests ongoing motion. No compressive canal or foraminal narrowing.  IMPRESSION: Cervical region: No central canal stenosis. Facet arthropathy notably on the right at C3-4 and on the left at C6-7 and C7-T1. This could be associated with mechanical neck pain. Degenerative spondylosis from C3-4 through C5-6. Foraminal narrowing that could possibly affect the right C4 nerve an left C5 nerve. Mild bilateral foraminal narrowing at C5-6.  Lumbar region: Posterior decompression, diskectomy and fusion from L4 to the sacrum. 4 mm anterolisthesis at L4-5 and L5-S1 which does not change. No lucency around the screws at L4 or L5. Some lucency around the screws at the sacrum suggesting there could be some ongoing motion at the L5-S1 level. There is no canal or foraminal stenosis in the fusion segment however.  Degenerative spondylosis and facet osteoarthritis at L1-2 and L2-3 which could be associated with back pain. No neural compression is demonstrated however.  At L3-4, there is disc degeneration and facet arthropathy right worse than left with bilateral lateral recess and foraminal narrowing, worse on the right than the left. In particular, there could be focal compression of the right L4 nerve.   Electronically Signed By: Paulina Fusi M.D. On: 07/18/2017 12:34  Lumbar DG Myelogram Lumbosacral: Results for orders placed during the hospital encounter of 02/15/23 DG MYELOGRAPHY LUMBAR INJ LUMBOSACRAL  Narrative CLINICAL DATA:  Chronic, progressively worsening low back pain, worse on the right. History of prior fusion in 2017.  EXAM: LUMBAR MYELOGRAM  CT LUMBAR MYELOGRAM  FLUOROSCOPY: Radiation Exposure Index (as provided by the fluoroscopic device): 19.3 mGy Kerma  PROCEDURE: After thorough discussion of risks and benefits of the procedure including bleeding, infection, injury to nerves, blood vessels, adjacent structures as well as headache and CSF leak, written and oral informed consent was obtained. Consent was obtained by Dr. Malachi Pro. Time out form was completed.  Patient was positioned prone on the fluoroscopy table. Local anesthesia was provided with 1% lidocaine without epinephrine after prepped and draped in the usual sterile fashion. Puncture was performed at L1-L2 using a 3 1/2 inch 22-gauge spinal needle via right interlaminar approach. Using a single pass through the dura, the needle was placed within the thecal sac, with return of clear CSF. 15 mL of Isovue M-200 was injected into the thecal sac, with normal opacification of the nerve roots and cauda equina consistent with free flow within the subarachnoid space.  I personally performed the lumbar puncture and administered the intrathecal contrast. I also personally supervised acquisition of the myelogram images.  TECHNIQUE: Contiguous axial images were obtained through the lumbar spine after the intrathecal infusion of contrast. Coronal and  sagittal reconstructions  were obtained of the axial image sets.  COMPARISON:  Lumbar spine x-rays dated August 13, 2018. CT lumbar myelogram dated July 18, 2017.  FINDINGS: LUMBAR MYELOGRAM FINDINGS:  Prior L4-S1 PLIF. Hardware is intact. Progressive dextroscoliosis. Chronic fused mild anterolisthesis at L4-L5 and L5-S1. No dynamic instability.  Small posterior disc osteophyte complexes from L1-L2 through L3-L4. Mild spinal canal stenosis at L1-L2 and L2-L3. Mild-to-moderate spinal canal stenosis at L3-L4.  CT LUMBAR MYELOGRAM FINDINGS:  Segmentation: Standard.  Alignment: Progressive dextroscoliosis. Unchanged mild fused anterolisthesis at L4-L5 and L5-S1.  Vertebrae: Acute nondisplaced fracture of the right L1 transverse process. Chronic mild T12 inferior endplate compression deformity, new since 2018. Prior L4-S1 PLIF with interval solid interbody and posterior osseous fusion. Unchanged mild lucency surrounding the bilateral S1 pedicle screws.  Conus medullaris and cauda equina: Conus extends to the L1-L2 level. Conus and cauda equina appear normal.  Paraspinal and other soft tissues: Aortoiliac atherosclerotic vascular disease.  Disc levels:  T11-T12: Mild disc bulging and bilateral facet arthropathy. Mild right neuroforaminal stenosis. No spinal canal or left neuroforaminal stenosis.  T12-L1: Progressive diffuse disc bulging eccentric to the right. Large right subarticular disc extrusion/extruded disc fragment migrating inferiorly posterior to the L1 vertebral body. Smaller left subarticular extruded disc material with vacuum phenomenon posterior to the L1 vertebral body, potentially arising from the T12-L1 or L1-L2 disc. Large right far lateral disc extrusion migrating superiorly. Mild bilateral facet arthropathy. Mild-to-moderate spinal canal and right lateral recess stenosis. Mild left lateral recess stenosis. Mild right neuroforaminal stenosis. No left  neuroforaminal stenosis. Findings have progressed since 2018.  L1-L2: Small circumferential disc osteophyte complex moderate left and mild right facet arthropathy. Mild-to-moderate left lateral recess stenosis. Mild bilateral neuroforaminal stenosis. No spinal canal stenosis. Findings have progressed since 2018.  L2-L3: Mild disc bulging and endplate spurring eccentric to the left. Small amount of air in the left inferior subarticular base posterior to the L3 vertebral body, presumably within extruded disc. Moderate to severe left and mild right facet arthropathy. Mild left lateral recess and neuroforaminal stenosis. No spinal canal or right neuroforaminal stenosis. Findings have progressed since 2018.  L3-L4: Circumferential disc osteophyte complex with severe right and moderate to severe left facet arthropathy. Moderate spinal canal stenosis. Mild to moderate right and mild left lateral recess stenosis. Moderate left and mild right neuroforaminal stenosis. Findings have progressed since 2018.  L4-L5: Prior posterior decompression and PLIF. No residual stenosis.  L5-S1: Prior posterior decompression and PLIF. No residual stenosis.  IMPRESSION: 1. Prior L4-S1 PLIF with interval solid interbody and posterior osseous fusion. Unchanged mild lucency surrounding the bilateral S1 pedicle screws, concerning for loosening. No residual stenosis. 2. Progressive multilevel lumbar spondylosis as described above. Mild-to-moderate spinal canal and right lateral recess stenosis at T12-L1. Moderate spinal canal and left neuroforaminal stenosis at L3-L4. 3. Acute nondisplaced fracture of the right L1 transverse process. 4. Chronic mild T12 inferior endplate compression deformity, new since 2018. 5.  Aortic Atherosclerosis (ICD10-I70.0).   Electronically Signed By: Obie Dredge M.D. On: 02/15/2023 14:26  Spine Imaging: Whole Spine DG Myelogram views: Results for orders placed during the  hospital encounter of 07/18/17 DG MYELOGRAPHY LUMBAR INJ MULTI REGION  Narrative CLINICAL DATA:  Low back pain and right leg pain and numbness. Vibration sensation. Cervical region pain on the left when turning the head to the right. 1 minutes 44 seconds. 485.26 micro gray meter squared  FLUOROSCOPY TIME:  dictate in minutes and seconds  PROCEDURE: LUMBAR PUNCTURE FOR CERVICAL AND LUMBAR  MYELOGRAM  CERVICAL AND LUMBAR MYELOGRAM  CT CERVICAL MYELOGRAM  CT LUMBAR MYELOGRAM  After thorough discussion of risks and benefits of the procedure including bleeding, infection, injury to nerves, blood vessels, adjacent structures as well as headache and CSF leak, written and oral informed consent was obtained. Consent was obtained by Dr. Paulina Fusi.  Patient was positioned prone on the fluoroscopy table. Local anesthesia was provided with 1% lidocaine without epinephrine after prepped and draped in the usual sterile fashion. Puncture was performed at the L1-2 using a 3 1/2 inch 22-gauge spinal needle via left para median approach. Using a single pass through the dura, the needle was placed within the thecal sac, with return of clear CSF. 10 mL of Omnipaque-300 was injected into the thecal sac, with normal opacification of the nerve roots and cauda equina consistent with free flow within the subarachnoid space. The patient was then moved to the trendelenburg position and contrast flowed into the Cervical spine region.  I personally performed the lumbar puncture and administered the intrathecal contrast. I also personally performed acquisition of the myelogram images.  TECHNIQUE: Contiguous axial images were obtained through the Cervical and Lumbar spine after the intrathecal infusion of infusion. Coronal and sagittal reconstructions were obtained of the axial image sets.  FINDINGS: CERVICAL AND LUMBAR MYELOGRAM FINDINGS:  There is curvature convex to the right. There is disc  space narrowing at L1-2 with osteophytic ridging and mild narrowing of the canal. L2-3 shows similar osteophytic ridging with mild narrowing of the canal. L3-4 shows stenosis worse on the right than the left which could cause right-sided neural compression. From L4-S1, there has been previous decompression, diskectomy and fusion procedure with pedicle screws and posterior rods. There appears to be some lucency around the screws. See results of CT. There is no compressive canal stenosis or apparent neural compression in that segment. Standing flexion extension views do not demonstrate any subluxation.  In the cervical region, there is fairly low opacity. No evidence of central canal stenosis. Small anterior extradural defects from C3-4 through C6-7. See results of CT.  CT CERVICAL MYELOGRAM FINDINGS:  Foramen magnum is widely patent.  C1-2 is normal.  C2-3 is normal.  C3-4: Facet arthropathy on the right with 2 mm of anterolisthesis. Bulging of the disc. Uncovertebral osteophytes right more than left. Right foraminal narrowing that could affect the right C4 nerve.  C4-5: Mild facet osteoarthritis. Degenerative spondylosis with endplate osteophytes but no soft disc herniation. No central canal stenosis. Foraminal stenosis left worse than right. Left C5 nerve could be compressed.  C5-6: Spondylosis with endplate osteophytes. No compressive central canal stenosis. Mild bilateral bony foraminal narrowing.  C6-7: No disc pathology. Mild facet arthritis on the left. No stenosis.  C7-T1: No disc pathology. Mild facet arthritis on the left. No stenosis.  CT LUMBAR MYELOGRAM FINDINGS:  T12-L1:  Mild bulging of the disc.  No stenosis.  L1-2: Disc degeneration with vacuum phenomenon. Endplate osteophytes and bulging of the disc. Mild stenosis of both lateral recesses left more than right. No definite neural compression.  L2-3: Disc degeneration with vacuum phenomenon and loss of  height. Endplate osteophytes and bulging of the disc. Mild facet hypertrophy. Mild narrowing of the lateral recesses left more than right but no definite neural compression.  L3-4: Disc degeneration of vacuum phenomenon and bulging of the disc. Bilateral facet arthropathy right worse than left. Stenosis of both lateral recesses right worse than left. Neural compression could occur at this level, particularly affecting the  right L4 nerve root.  L4-5: Previous posterior decompression, diskectomy and fusion procedure. L4 is 4 mm anterior relative to L5. Interbody fusion material satisfactorily positioned. No lucency around the L4 or L5 screws presently. No compressive canal or foraminal stenosis.  L5-S1: Previous posterior decompression, diskectomy and fusion procedure. L5 is 4 mm anterior relative to S1. Interbody fusion material satisfactorily positioned. No lucency around the L5 screws. Some lucency around the sacral screws. This suggests ongoing motion. No compressive canal or foraminal narrowing.  IMPRESSION: Cervical region: No central canal stenosis. Facet arthropathy notably on the right at C3-4 and on the left at C6-7 and C7-T1. This could be associated with mechanical neck pain. Degenerative spondylosis from C3-4 through C5-6. Foraminal narrowing that could possibly affect the right C4 nerve an left C5 nerve. Mild bilateral foraminal narrowing at C5-6.  Lumbar region: Posterior decompression, diskectomy and fusion from L4 to the sacrum. 4 mm anterolisthesis at L4-5 and L5-S1 which does not change. No lucency around the screws at L4 or L5. Some lucency around the screws at the sacrum suggesting there could be some ongoing motion at the L5-S1 level. There is no canal or foraminal stenosis in the fusion segment however.  Degenerative spondylosis and facet osteoarthritis at L1-2 and L2-3 which could be associated with back pain. No neural compression is demonstrated however.  At L3-4, there is disc degeneration and facet arthropathy right worse than left with bilateral lateral recess and foraminal narrowing, worse on the right than the left. In particular, there could be focal compression of the right L4 nerve.   Electronically Signed By: Paulina Fusi M.D. On: 07/18/2017 12:34  Hip Imaging: Hip-R DG 2-3 views: Results for orders placed during the hospital encounter of 07/19/16 DG HIP UNILAT WITH PELVIS 2-3 VIEWS RIGHT  Narrative CLINICAL DATA:  Right groin and back pain.  EXAM: DG HIP (WITH OR WITHOUT PELVIS) 2-3V RIGHT  COMPARISON:  None.  FINDINGS: There is no evidence of hip fracture or dislocation. There is no evidence of arthropathy or other focal bone abnormality.  L4-5 and L5-S1 PLIF hardware. Suspected lucency/ loosening of the L4 screws.  IMPRESSION: 1. Negative right hip. 2. Possible loosening of L4 pedicle screws.   Electronically Signed By: Marnee Spring M.D. On: 07/19/2016 16:08  Foot Imaging: Foot-R DG Complete: Results for orders placed in visit on 06/29/21 DG Foot Complete Right  Narrative Please see detailed radiograph report in office note.  Foot-L DG Complete: Results for orders placed in visit on 06/29/21 DG Foot Complete Left  Narrative Please see detailed radiograph report in office note.  Wrist Imaging: Wrist-L DG Complete: Results for orders placed during the hospital encounter of 05/24/23 DG Wrist Complete Left  Narrative CLINICAL DATA:  Fall, left wrist pain  EXAM: LEFT WRIST - COMPLETE 3+ VIEW  COMPARISON:  04/02/2008  FINDINGS: Scapholunate widening/dissociation with proximal migration of the lunate into the 1st carpal row, reflecting [facility] wrist.  Moderate degenerative changes of the 1st carpometacarpal joint. Mild degenerative changes of the radiocarpal articulation.  No fracture is seen.  Mild dorsal soft tissue swelling.  IMPRESSION: No fracture is seen.  [facility] wrist, as  above.   Electronically Signed By: Charline Bills M.D. On: 06/02/2023 00:14  Complexity Note: Imaging results reviewed.                         ROS  Cardiovascular: Daily Aspirin intake Pulmonary or Respiratory: No reported pulmonary signs or symptoms  such as wheezing and difficulty taking a deep full breath (Asthma), difficulty blowing air out (Emphysema), coughing up mucus (Bronchitis), persistent dry cough, or temporary stoppage of breathing during sleep Neurological: No reported neurological signs or symptoms such as seizures, abnormal skin sensations, urinary and/or fecal incontinence, being born with an abnormal open spine and/or a tethered spinal cord Psychological-Psychiatric: Anxiousness and Depressed Gastrointestinal: No reported gastrointestinal signs or symptoms such as vomiting or evacuating blood, reflux, heartburn, alternating episodes of diarrhea and constipation, inflamed or scarred liver, or pancreas or irrregular and/or infrequent bowel movements Genitourinary: No reported renal or genitourinary signs or symptoms such as difficulty voiding or producing urine, peeing blood, non-functioning kidney, kidney stones, difficulty emptying the bladder, difficulty controlling the flow of urine, or chronic kidney disease Hematological: No reported hematological signs or symptoms such as prolonged bleeding, low or poor functioning platelets, bruising or bleeding easily, hereditary bleeding problems, low energy levels due to low hemoglobin or being anemic Endocrine: No reported endocrine signs or symptoms such as high or low blood sugar, rapid heart rate due to high thyroid levels, obesity or weight gain due to slow thyroid or thyroid disease Rheumatologic: Joint aches and or swelling due to excess weight (Osteoarthritis), Rheumatoid arthritis, Joint swelling and pain associated with red skin patches (Psoriatic arthitis), and No reported rheumatological signs and symptoms such as fatigue,  joint pain, tenderness, swelling, redness, heat, stiffness, decreased range of motion, with or without associated rash Musculoskeletal: Negative for myasthenia gravis, muscular dystrophy, multiple sclerosis or malignant hyperthermia Work History: Retired  Allergies  Madeline Young is allergic to cosentyx [secukinumab], sertraline hcl, adalimumab, avelox [moxifloxacin hcl in nacl], nitrofurantoin, pregabalin, rosuvastatin, latex, macrobid [nitrofurantoin monohyd macro], and statins.  Laboratory Chemistry Profile   Renal Lab Results  Component Value Date   BUN 25 (H) 11/13/2023   CREATININE 0.75 11/13/2023   GFR 81.33 11/13/2023   GFRAA >60 02/18/2016   GFRNONAA >60 02/18/2016   SPECGRAV 1.015 11/17/2014   PHUR 7.0 11/17/2014   PROTEINUR neg 11/17/2014     Electrolytes Lab Results  Component Value Date   NA 141 11/13/2023   K 4.5 11/13/2023   CL 103 11/13/2023   CALCIUM 9.2 11/13/2023     Hepatic Lab Results  Component Value Date   AST 18 11/13/2023   ALT 14 11/13/2023   ALBUMIN 3.8 11/13/2023   ALKPHOS 57 11/13/2023     ID Lab Results  Component Value Date   HIV NONREACTIVE 01/07/2016   SARSCOV2NAA Not Detected 10/15/2019   STAPHAUREUS NEGATIVE 02/09/2016   MRSAPCR NEGATIVE 02/09/2016   HCVAB NEGATIVE 01/07/2016     Bone Lab Results  Component Value Date   VD25OH 33.35 09/11/2017     Endocrine Lab Results  Component Value Date   GLUCOSE 96 11/13/2023   GLUCOSEU NEGATIVE 08/13/2023   HGBA1C 6.1 01/31/2022   TSH 0.77 11/13/2023   FREET4 0.76 01/27/2013     Neuropathy Lab Results  Component Value Date   VITAMINB12 >1537 (H) 11/13/2023   FOLATE >25.2 11/13/2023   HGBA1C 6.1 01/31/2022   HIV NONREACTIVE 01/07/2016     CNS No results found for: "COLORCSF", "APPEARCSF", "RBCCOUNTCSF", "WBCCSF", "POLYSCSF", "LYMPHSCSF", "EOSCSF", "PROTEINCSF", "GLUCCSF", "JCVIRUS", "CSFOLI", "IGGCSF", "LABACHR", "ACETBL"   Inflammation (CRP: Acute  ESR: Chronic) Lab  Results  Component Value Date   CRP <1.0 05/16/2023   ESRSEDRATE 40 (H) 06/18/2023     Rheumatology No results found for: "RF", "ANA", "LABURIC", "URICUR", "LYMEIGGIGMAB", "LYMEABIGMQN", "HLAB27"   Coagulation Lab Results  Component Value Date   INR 1.0 02/07/2023   LABPROT 10.5 02/07/2023   APTT 26 02/07/2023   PLT 326.0 05/16/2023     Cardiovascular Lab Results  Component Value Date   HGB 11.3 (L) 05/16/2023   HCT 34.7 (L) 05/16/2023     Screening Lab Results  Component Value Date   SARSCOV2NAA Not Detected 10/15/2019   STAPHAUREUS NEGATIVE 02/09/2016   MRSAPCR NEGATIVE 02/09/2016   HCVAB NEGATIVE 01/07/2016   HIV NONREACTIVE 01/07/2016     Cancer No results found for: "CEA", "CA125", "LABCA2"   Allergens No results found for: "ALMOND", "APPLE", "ASPARAGUS", "AVOCADO", "BANANA", "BARLEY", "BASIL", "BAYLEAF", "GREENBEAN", "LIMABEAN", "WHITEBEAN", "BEEFIGE", "REDBEET", "BLUEBERRY", "BROCCOLI", "CABBAGE", "MELON", "CARROT", "CASEIN", "CASHEWNUT", "CAULIFLOWER", "CELERY"     Note: Lab results reviewed.  PFSH  Drug: Madeline Young  reports no history of drug use. Alcohol:  reports current alcohol use of about 7.0 standard drinks of alcohol per week. Tobacco:  reports that she quit smoking about 52 years ago. Her smoking use included cigarettes. She has never been exposed to tobacco smoke. She has never used smokeless tobacco. 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 Family: family history includes Coronary artery disease (age of onset: 70) in her father; Epilepsy in her grandchild; Heart disease  in her mother; Hyperlipidemia in her father and mother.  Past Surgical History:  Procedure Laterality Date   BACK SURGERY  02/17/2016   Dr. Leotis Shames- L4-L5 fusion    BIOPSY THYROID  2014   COLONOSCOPY     COLONOSCOPY W/ POLYPECTOMY     EYE SURGERY     bilateral cataract surgery w/ lens implant   FOOT SURGERY  2006   Right foot , secondary to severe loss of joint [facility])   POLYPECTOMY     TONSILLECTOMY     UPPER GASTROINTESTINAL ENDOSCOPY  2005   With empiric esophageal dilation   Active Ambulatory Problems    Diagnosis Date Noted   Familial hyperlipidemia 09/15/2008   Major depressive disorder, single episode, in remission (HCC) 09/15/2008   Allergic rhinitis 09/15/2008   Screening for cervical cancer 08/21/2011   Screening for breast cancer 08/21/2011   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   Multinodular goiter (nontoxic) 08/11/2012   Overweight (BMI 25.0-29.9) 08/11/2012   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   Travel advice encounter 01/27/2013   Hx of adenomatous polyp of colon 10/06/2008   GERD (gastroesophageal reflux disease) 11/17/2014   Globus sensation since at least 2005 04/29/2015   Spondylolisthesis of lumbar region 07/16/2015   Carotid artery stenosis, asymptomatic, bilateral 10/29/2016   ADD (attention deficit disorder) 10/29/2016   Anxiety 10/11/2018   Insomnia 08/10/2019   Rhinitis, non-allergic 11/10/2019   Sciatica 01/28/2020   Drug allergy 06/29/2021   Fibromyalgia 06/29/2021   Generalized osteoarthritis 06/29/2021   Hand joint pain 06/29/2021   Other intervertebral disc degeneration, lumbar region 06/29/2021   Other long term (current) drug therapy 06/29/2021   Psoriatic arthritis (HCC) 06/29/2021   Onychomycosis 08/17/2021   Urge incontinence 05/16/2022   Slac (scapholunate advanced collapse) of wrist, left 06/03/2023   Unintentional Tylenol overdose, sequela 08/14/2023   Chronic pain not due to malignancy 08/14/2023   Sore throat 10/26/2023    Osteopenia 11/13/2023   Carpal tunnel syndrome (Bilateral) by EMG 11/13/2023  Chronic pain syndrome 11/28/2023   Pharmacologic therapy 11/28/2023   Disorder of skeletal system 11/28/2023   Problems influencing health status 11/28/2023   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/28/2023   Chronic low back pain (1ry area of Pain) (Bilateral) w/o sciatica 11/28/2023   Chronic hip pain (2ry area of Pain) (Bilateral) 11/28/2023   Decreased range of motion of hips (Bilateral) 11/28/2023   Low back pain of over 3 months duration 11/28/2023   Low back pain radiating to legs (Bilateral) 11/28/2023   Leg swelling 11/28/2023   Abnormal CT myelogram of lumbar spine (02/15/2023) 11/28/2023   Degenerative joint disease involving multiple joints 11/28/2023   Cervical spondylosis without myelopathy 07/26/2017   Lumbar stenosis with neurogenic claudication 03/26/2015   Spinal stenosis at L4-L5 level 04/17/2023   Pain in thoracic spine 03/28/2017   Acquired scoliosis 08/13/2018   Lumbar facet Arthropathy 10/22/2015   Lumbar radiculitis 03/26/2015   Chronic lumbar radiculopathy (S1>L5) (Right) by EMG 05/10/2016   Pseudoarthrosis of lumbar spine 07/26/2017   Dextroscoliosis of lumbar spine 10/22/2015   Thoracic radiculopathy 04/17/2023   Abnormal NCS (nerve conduction studies) [facility]) (09/27/2023) 11/28/2023   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/2023   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) 11/28/2023   Lumbosacral radiculopathy at L5 (Right) by EMG 11/28/2023   Lumbosacral radiculopathy at S1 (Right) by EMG 11/28/2023   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 by (08/29/2023) DEXA Scan 11/28/2023   Abnormal MRI, lumbar spine (04/19/2023) ([facility]) 11/28/2023   Discogenic low back pain 11/28/2023   Lumbar Foraminal stenosis 11/28/2023   Lumbar DDD w/ LBP & LEP 11/28/2023   Resolved Ambulatory Problems    Diagnosis Date Noted   GERD 09/15/2008   ECZEMA 09/15/2008   Osteoarthritis 09/15/2008    Arthritis of ankle or foot, degenerative 08/21/2011   Paronychia of second finger of left hand 05/10/2012   Head or neck swelling, mass, or lump 05/12/2012   Multiple thyroid nodules 06/11/2012   Urinary problem 01/01/2013   Ear pressure, right 11/10/2019   Urinary frequency 01/28/2020   Lower respiratory infection (e.g., bronchitis, pneumonia, pneumonitis, pulmonitis) 05/10/2020   Suspected COVID-19 virus infection 05/10/2020   Fecal smearing 08/10/2020   Chronic right ear pain 02/11/2021   Elevated blood-pressure reading, without diagnosis of hypertension 08/17/2021   Mass of abdomen 05/23/2023   Acute pansinusitis 10/03/2023   Psoriasis 10/26/2023   Past Medical History:  Diagnosis Date   Adenoma 10/06/2008   ADHD (attention deficit hyperactivity disorder)    Allergy    Arthritis    Cataract    Complication of anesthesia    Depression    Hyperlipidemia    Incontinence    Spinal stenosis of lumbar region 03/10/2015   Statin intolerance 01/27/2013   Constitutional Exam  General appearance: Well nourished, well developed, and well hydrated. In no apparent acute distress Vitals:   11/28/23 1014  BP: (!) 117/58  Pulse: (!) 101  Resp: 18  Temp: (!) 97.2 F (36.2 C)  SpO2: 96%  Weight: 143 lb (64.9 kg)  Height: 5\' 5"  (1.651 m)   BMI Assessment: Estimated body mass index is 23.8 kg/m as calculated from the following:   Height as of this encounter: 5\' 5"  (1.651 m).   Weight as of this encounter: 143 lb (64.9 kg).  BMI interpretation table: BMI level Category Range association with higher incidence of chronic pain  <18 kg/m2 Underweight   18.5-24.9 kg/m2 Ideal body weight   25-29.9 kg/m2 Overweight Increased incidence by 20%  30-34.9 kg/m2 Obese (Class I) Increased incidence by 68%  35-39.9 kg/m2 Severe obesity (Class II) Increased incidence by 136%  >40 kg/m2 Extreme obesity (Class III) Increased incidence by 254%   Patient's current BMI Ideal Body weight  Body  mass index is 23.8 kg/m. Ideal body weight: 57 kg (125 lb 10.6 oz) Adjusted ideal body weight: 60.1 kg (132 lb 9.6 oz)   BMI Readings from Last 4 Encounters:  11/28/23 23.80 kg/m  11/21/23 24.30 kg/m  11/13/23 24.20 kg/m  10/26/23 24.33 kg/m   Wt Readings from Last 4 Encounters:  11/28/23 143 lb (64.9 kg)  11/21/23 146 lb (66.2 kg)  11/13/23 145 lb 6.4 oz (66 kg)  10/26/23 146 lb 3.2 oz (66.3 kg)    Psych/Mental status: Alert, oriented x 3 (person, place, & time)       Eyes: PERLA Respiratory: No evidence of acute respiratory distress  Assessment  Primary Diagnosis & Pertinent Problem List: The primary encounter diagnosis was Chronic pain syndrome. Diagnoses of Chronic low back pain (1ry area of Pain) (Bilateral) w/o sciatica, Chronic hip pain (2ry area of Pain) (Bilateral), Decreased range of motion of hips (Bilateral), Low back pain of over 3 months duration, Low back pain radiating to legs (Bilateral), Leg swelling, Abnormal MRI, lumbar spine (04/19/2023), Abnormal CT myelogram of lumbar spine (02/15/2023), Abnormal NCS (nerve conduction studies) [facility]) (09/27/2023), Lumbosacral radiculopathy at L5 (Right) by EMG, Lumbosacral radiculopathy at S1 (Right) by EMG, Chronic lumbar radiculopathy (S1>L5) (Right) by EMG, 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  Visit Diagnosis (New problems to examiner): 1. Chronic pain syndrome   2. Chronic low back pain (1ry area of Pain) (Bilateral)  w/o sciatica   3. Chronic hip pain (2ry area of Pain) (Bilateral)   4. Decreased range of motion of hips (Bilateral)   5. Low back pain of over 3 months duration   6. Low back pain radiating to legs (Bilateral)   7. Leg swelling   8. Abnormal MRI, lumbar spine (04/19/2023)   9. Abnormal CT myelogram of lumbar spine (02/15/2023)   10. Abnormal NCS (nerve conduction studies) ([facility]) (09/27/2023)   11. Lumbosacral radiculopathy at L5 (Right) by EMG   12. Lumbosacral radiculopathy at S1 (Right) by EMG   13. Chronic lumbar radiculopathy (S1>L5) (Right) by EMG   14. 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 Discogenic low back pain   17. Lumbar Foraminal stenosis   18. Lumbar stenosis with neurogenic claudication   19. Dextroscoliosis of lumbar spine   20. Lumbar facet Arthropathy   21. Lumbar DDD w/ LBP & LEP   22. Pharmacologic therapy   23. Disorder of skeletal system   24. Problems influencing health status   25. 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 Median nerve neuropathy, unspecified laterality   27. Carpal tunnel syndrome (Bilateral) by EMG    Plan of Care (Initial workup plan)  Note: Ms. Aydin was reminded that as per protocol, today's visit has been an evaluation only. We have not taken  over the patient's controlled substance management.  Problem-specific plan: Assessment and Plan    Chronic Lower Back Pain Chronic lower back pain radiates to the hips and groin, alternating sides. A 2017 L4-L5 spinal fusion had complications, including non-union and persistent pain, but recent imaging shows complete fusion. Pain worsens with physical activity and improves with rest. Physical therapy and previous injections had mixed results. Interventional pain management options, such as implants and radiofrequency ablation, were discussed, noting risks like infection, nerve damage, and incomplete pain  relief. Alternative treatments include pain stimulators and spacers. Emphasis was placed on matching clinical symptoms with imaging findings. Plan to review lumbar spine flexion and extension x-rays, recent MRI, and myelogram reports. Evaluate interventional pain management options. Follow-up in three weeks to discuss findings and treatment options.  Hip Pain Intermittent hip and groin pain, likely due to arthritis, is significant, especially in the left hip. Previous imaging indicated arthritis, and further imaging may be necessary to assess soft tissue and inflammation. Plan to review the 2023 hip MRI and consider additional imaging if needed. Evaluate interventional pain management options.  Arthritis (Osteoarthritis, Inflammatory, Psoriatic) Osteoarthritis, inflammatory arthritis, and psoriatic arthritis management is ongoing under rheumatologist care. Previous treatments with Remicade and Cosentyx were discontinued due to adverse effects. Coordination with the rheumatologist is essential to optimize treatment and monitor for adverse effects. Plan to review recent lab results from the rheumatologist and coordinate care for ongoing management.  Peripheral Edema Bilateral leg swelling began with gabapentin use and persists despite discontinuation of gabapentin and Lyrica. Recent rheumatologist evaluation showed no signs of cellulitis. Further evaluation may be needed to rule out other causes. Monitor for changes in swelling and signs of infection. Consider further evaluation if swelling persists or worsens.  General Health Maintenance Recent kidney and liver function tests are normal. Continue routine monitoring of kidney and liver function.  Follow-up A follow-up appointment is scheduled in three weeks.       Lab Orders         Compliance Drug Analysis, Ur         Magnesium         25-Hydroxy vitamin D Lcms D2+D3     Imaging Orders         DG Lumbar Spine Complete W/Bend         DG HIPS  BILAT W OR W/O PELVIS MIN 5 VIEWS     Referral Orders  No referral(s) requested today   Procedure Orders    No procedure(s) ordered today   Pharmacotherapy (current): Medications ordered:  No orders of the defined types were placed in this encounter.  Medications administered during this visit: Levetta D. Young "Madeline Young" had no medications administered during this visit.   Analgesic Pharmacotherapy:  Opioid Analgesics: For patients currently taking or requesting to take opioid analgesics, in accordance with [facility] Guidelines, we will assess their risks and indications for the use of these substances. After completing our evaluation, we may offer recommendations, but we no longer take patients for medication management. The prescribing physician will ultimately decide, based on his/her training and level of comfort whether to adopt any of the recommendations, including whether or not to prescribe such medicines.  Membrane stabilizer: To be determined at a later time  Muscle relaxant: To be determined at a later time  NSAID: To be determined at a later time  Other analgesic(s): To be determined at a later time   Interventional management options: Ms. Rallis was informed that there  is no guarantee that she would be a candidate for interventional therapies. The decision will be based on the results of diagnostic studies, as well as Ms. Letendre's risk profile.  Procedure(s) under consideration:  Pending results of ordered studies     Interventional Therapies  Risk Factors  Considerations  Medical Comorbidities:    Poor candidate for RFA    Planned  Pending:   Diagnostic x-rays of lumbar spine w/ flexion/extension views. (11/28/2023)    Under consideration:   EMG/PNCV of both lower extremities. (Only right side tested.)   Completed:   None at this time   Therapeutic  Palliative (PRN) options:   None established   Completed by other providers:   None  reported     Provider-requested follow-up: Return in about 3 weeks (around 12/19/2023) for ( ), Eval-day (M,W), (F2F), 2nd Visit, for review of ordered tests.  Future Appointments  Date Time Provider Department Center  12/03/2023 10:30 AM Cira Rue, PT [facility] None  12/10/2023 10:30 AM Cira Rue, PT [facility] None  12/12/2023 11:15 AM Cira Rue, PT [facility] None  12/18/2023 11:40 AM [facility] 2 [facility]  12/19/2023  2:00 PM Delano Metz, MD [facility] None  01/09/2024  1:30 PM Cottle, Steva Ready., MD [facility] None  02/11/2024  2:45 PM Sherlene Shams, MD [facility]  08/22/2024 10:50 AM [facility] ANNUAL WELLNESS VISIT [facility]    Duration of encounter: 153 minutes.  Total time on encounter, as per AMA guidelines included both the face-to-face and non-face-to-face time personally spent by the physician and/or other qualified health care professional(s) on the day of the encounter (includes time in activities that require the physician or other qualified health care professional and does not include time in activities normally performed by clinical staff). Physician's time may include the following activities when performed: Preparing to see the patient (e.g., pre-charting review of records, searching for previously ordered imaging, lab work, and nerve conduction tests) Review of prior analgesic pharmacotherapies. Reviewing PMP Interpreting ordered tests (e.g., lab work, imaging, nerve conduction tests) Performing post-procedure evaluations, including interpretation of diagnostic procedures Obtaining and/or reviewing separately obtained history Performing a medically appropriate examination and/or evaluation Counseling and educating the patient/family/caregiver Ordering medications, tests, or procedures Referring and communicating with other health care professionals (when not separately reported) Documenting clinical information in the electronic or other  health record Independently interpreting results (not separately reported) and communicating results to the patient/ family/caregiver Care coordination (not separately reported)  Note by: Oswaldo Done, MD (AI and TTS technology used. I apologize for any typographical errors that were not detected and corrected.) Date: 11/28/2023; Time: 9:47 PM

## 2023-11-28 NOTE — Progress Notes (Signed)
I will call canopy on Thursday when I return to the office.

## 2023-11-28 NOTE — Progress Notes (Signed)
 Safety precautions to be maintained throughout the outpatient stay will include: orient to surroundings, keep bed in low position, maintain call bell within reach at all times, provide assistance with transfer out of bed and ambulation.

## 2023-11-29 ENCOUNTER — Encounter: Payer: Self-pay | Admitting: Neurosurgery

## 2023-11-29 NOTE — Progress Notes (Signed)
 Tried calling canopy and no one answered, I will try again later.

## 2023-11-30 NOTE — Progress Notes (Signed)
Called canopy and had to leave a message.

## 2023-12-01 LAB — COMPLIANCE DRUG ANALYSIS, UR

## 2023-12-03 ENCOUNTER — Ambulatory Visit: Payer: Medicare Other | Admitting: Physical Therapy

## 2023-12-03 ENCOUNTER — Encounter: Payer: Self-pay | Admitting: Physical Therapy

## 2023-12-03 DIAGNOSIS — R262 Difficulty in walking, not elsewhere classified: Secondary | ICD-10-CM

## 2023-12-03 DIAGNOSIS — M5459 Other low back pain: Secondary | ICD-10-CM | POA: Diagnosis not present

## 2023-12-03 NOTE — Therapy (Signed)
 OUTPATIENT PHYSICAL THERAPY TREATMENT   Patient Name: NATOYA VISCOMI MRN: [id number] DOB:[birth year], 68 y.o., female Today's Date: 12/03/2023  END OF SESSION:  PT End of Session - 12/03/23 1246     Visit Number 24    Number of Visits 30    Date for PT Re-Evaluation 12/27/23    Authorization Type [employer] reporting period from  10/18/2023    Authorization - Visit Number --    Authorization - Number of Visits --    Progress Note Due on Visit 30    PT Start Time 1038    PT Stop Time 1119    PT Time Calculation (min) 41 min    Equipment Utilized During Treatment Gait belt    Activity Tolerance Patient tolerated treatment well;Patient limited by fatigue    Behavior During Therapy WFL for tasks assessed/performed                  Past Medical History:  Diagnosis Date   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   ADHD (attention deficit hyperactivity disorder)    Allergy    Anxiety    Arthritis    Cataract    bil cateracts removed   Complication of anesthesia    first colonoscopy pt states she woke up   Depression    GERD (gastroesophageal reflux disease)    Globus sensation    Hyperlipidemia    Incontinence    bowels at times   Insomnia    Multinodular goiter (nontoxic)    Psoriatic arthritis (HCC)    Spinal stenosis of lumbar region 03/10/2015   MRI    Statin intolerance 01/27/2013   Past Surgical History:  Procedure Laterality Date   BACK SURGERY  02/17/2016   Dr. 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/28/2023   Chronic low back pain (1ry area of Pain) (Bilateral) w/o sciatica 11/28/2023   Chronic hip pain (2ry area of Pain) (Bilateral) 11/28/2023   Decreased range of motion of hips (Bilateral) 11/28/2023   Low back pain of over 3 months duration 11/28/2023   Low back pain radiating to legs (Bilateral) 11/28/2023   Leg swelling 11/28/2023   Abnormal CT myelogram of lumbar spine (02/15/2023) 11/28/2023   Degenerative joint disease involving multiple joints 11/28/2023   Abnormal NCS (nerve conduction studies) [facility]) (09/27/2023) 11/28/2023   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/2023   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) 11/28/2023   Lumbosacral radiculopathy at L5 (Right) by EMG 11/28/2023   Lumbosacral radiculopathy at S1 (Right) by EMG 11/28/2023   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 by (08/29/2023) DEXA Scan 11/28/2023   Abnormal MRI, lumbar spine (04/19/2023) ([facility]) 11/28/2023   Discogenic low back pain 11/28/2023   Lumbar Foraminal stenosis 11/28/2023   Lumbar DDD w/ LBP & LEP 11/28/2023   Osteopenia 11/13/2023   Carpal  tunnel syndrome (Bilateral) by EMG 11/13/2023   Sore throat 10/26/2023   Unintentional Tylenol overdose, sequela 08/14/2023   Chronic pain not due to malignancy 08/14/2023   Slac (scapholunate advanced collapse) of wrist, left 06/03/2023   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   Spinal stenosis at L4-L5 level 04/17/2023   Thoracic radiculopathy 04/17/2023   Urge incontinence 05/16/2022   Onychomycosis 08/17/2021   Drug allergy 06/29/2021   Fibromyalgia 06/29/2021   Generalized osteoarthritis 06/29/2021   Hand joint pain 06/29/2021   Other intervertebral disc degeneration, lumbar region 06/29/2021   Other long term (current) drug therapy 06/29/2021   Psoriatic arthritis (HCC) 06/29/2021   Sciatica 01/28/2020   Rhinitis,  non-allergic 11/10/2019   Insomnia 08/10/2019   Anxiety 10/11/2018   Acquired scoliosis 08/13/2018   Cervical spondylosis without myelopathy 07/26/2017   Pseudoarthrosis of lumbar spine 07/26/2017   Pain in thoracic spine 03/28/2017   Carotid artery stenosis, asymptomatic, bilateral 10/29/2016   ADD (attention deficit disorder) 10/29/2016   Chronic lumbar radiculopathy (S1>L5) (Right) by EMG 05/10/2016   Lumbar facet Arthropathy 10/22/2015   Dextroscoliosis of lumbar spine 10/22/2015   Spondylolisthesis of lumbar region 07/16/2015   Globus sensation since at least 2005 04/29/2015   Lumbar stenosis with neurogenic claudication 03/26/2015   Lumbar radiculitis 03/26/2015   GERD (gastroesophageal reflux disease) 11/17/2014   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   Travel advice encounter 01/27/2013   Multinodular goiter (nontoxic) 08/11/2012   Overweight (BMI 25.0-29.9) 08/11/2012   Screening for cervical cancer 08/21/2011   Screening for breast cancer 08/21/2011   Hx of adenomatous polyp of colon 10/06/2008   Familial hyperlipidemia 09/15/2008   Major depressive disorder, single episode, in remission (HCC) 09/15/2008   Allergic rhinitis 09/15/2008    PCP: Dr. Duncan Dull   REFERRING PROVIDER: Duke Salvia NP   REFERRING DIAG: Spinal stenosis of lumbar region  Rationale for Evaluation and Treatment: Rehabilitation  THERAPY DIAG:  Other low back pain  Difficulty in walking, not elsewhere classified  ONSET DATE: 2017   PERTINENT HISTORY:  Pt reports that she has three different types of arthritis: osteo, rheumatoid, and psoriatic. She had lumbar fusion in 2017 which she felt did not help significantly. She had an infusion for her psoriatic arthritis this past spring and it caused her low back pain. She recently had a spinal injection and this resolved radiating pain on her LLE, but she still has excruciating, radiating pain down her right lower extremity. She feels unsteady most of the  time and she has to use walker to keep from falling. Pt has also been told that she needs a reverse total shoulder because right shoulder RTC damage.   SUBJECTIVE:  SUBJECTIVE STATEMENT: Feeling okay today  Breakout of psoriasis for the last couple weeks - had to go to doctor for it  Couldn't eat anything due to burning mouth and then the psoriasis breakout around her mouth  The burning mouth sensation has improved - has been using several rinses to try to get it to go away  Doctor gave her a prescription to help her produce more saliva - hasn't tried it yet because it can cause dizziness as a side effect  Right hip has been waking her up at night  December 19, 2023 - has another appointment with new pain doctor  She wants to get an epidural to help with her pain  Had an appointment with a hand doctor - has questions about surgical option and wants to get more information before she decides what to do to help her hand numbness  Mid back hurts when she gets up to walk - increased pressure feeling (doesn't bother her when she's sitting) Right hip hurts the most when laying flat on her back  Rheumatologist told her she has secondary Sjogren's syndrome Has a wound on her left lower leg that won't heal (has been about 2 weeks)  PAIN:  Are you having pain?   NPRS: 4/10 (mid back), 7/10 in right hip when standing, right shoulder bothering her   PRECAUTIONS: None  PATIENT GOALS: To improve balance and to decrease pain   OBJECTIVE  1 RM TESTING (calculated from 10RM or less):  Seated leg press, seat position 1, single leg (last tested 10/16/2023);  R: 39# L: 57#   TODAY'S TREATMENT:                                                         Therapeutic exercise: to centralize symptoms and improve ROM, strength,  muscular endurance, and activity tolerance required for successful completion of functional activities.   NuStep level 5 using bilateral upper and lower extremities. Seat/handle setting 9/9. For improved extremity mobility, muscular endurance, and activity tolerance; and to induce the analgesic effect of aerobic exercise, stimulate improved joint nutrition, and prepare body structures and systems for following interventions. x 6  minutes. Average SPM = 68.    Seated leg press, seat position 1 R: 2x20 at 25# (~60% 1RM) L: 2x20 at 35# (~60% 1RM) 1 min rest between sets    Neuromuscular Re-education: to improve, balance, postural strength, muscle activation patterns, and stabilization strength required for functional activities:  Seated Single Arm Rows on Clear Theraball (to improve UE/periscapular/postural strength, core strength, and balance)   1x5 with 5# (weight too light) 2x20 each arm with 10#   Ball toss at [employer] with double leg stance   1x2 with pink ball on airex pad Discontinued due to patient not tolerating sensation of being on airex pad - felt very unsteady  1x20 with pink ball on firm surface  Standing balance on airex pad at treadmill (UE support available and used throughout)  WBOS - 1x30 seconds NBOS - 1x30 seconds  SLS - 1x30 seconds each leg  Education on HEP/handout including SLS with UE support  Pt required multimodal cuing for proper technique and to facilitate improved neuromuscular control, strength, range of motion, and functional ability resulting in improved performance and form.   PATIENT EDUCATION:  Education details: form and  technique for correct performance of exercise.  Person educated: Patient Education method: Explanation, Demonstration, Verbal cues, and Handouts Education comprehension: verbalized understanding, returned demonstration, and verbal cues required  HOME EXERCISE PROGRAM: Access Code: [email or web address] URL:  [email or web address] Date: 12/03/2023 Prepared by: Ahmani Daoud [place]  Exercises - Sit to Stand  - 3-4 x weekly - 2 sets - 7-10 reps - Standing March with Counter Support  - 3-4 x weekly - 3 sets - 10 reps - Standing Toe Taps  - 3-4 x weekly - 3 sets - 10 reps - Seated Single Arm Shoulder Row with Anchored Resistance  - 3-7 x weekly - 2-3 sets - 15 reps - 2 seconds hold - Sidelying Hip Flexion and Extension with Baby  - 1 x daily - 1 sets - 10 reps - Sidelying Hip Abduction  - 1 x daily - 1 sets - 10 reps - Sidelying Hip Circles  - 1 x daily - 1 sets - 10 reps - Standing Single Leg Stance with Counter Support  - 1 x daily - 2 sets - 30 second hold  HOME EXERCISE PROGRAM [[email or web address]] View at [email or web address]" using code: [email or web address] Supine Marching (phase 2) - 'up up, down down' -  Repeat 10 Repetitions, Complete 2 Sets, Perform 2 Times a Day  SCIATIC NERVE GLIDE -  Repeat 15 Repetitions, Complete 1 Set, Perform 2    ASSESSMENT:  CLINICAL IMPRESSION: Patient arrives to session reporting pain in her mid back, right hip, and right shoulder. The focus of today's session was to progress functional strength of the upper/lower extremities and exercises to challenge balance/stabilization. Patient continues to experience muscle fatigue throughout session but was able to complete all exercises with no change in mid back, right hip, or right shoulder pain. Patient reports feeling the same as she did when she arrived at end of session. Plan to incorporate more balance exercises next session. Patient would benefit from continued management of limiting condition by skilled physical therapist to address remaining impairments and functional limitations to work towards stated goals and return to PLOF or maximal functional independence.    OBJECTIVE IMPAIRMENTS: Abnormal gait, decreased balance, decreased endurance, decreased knowledge of use of DME, decreased mobility, difficulty walking,  decreased ROM, decreased strength, hypomobility, impaired flexibility, impaired sensation, impaired UE functional use, and pain.   ACTIVITY LIMITATIONS: carrying, lifting, bending, standing, squatting, sleeping, stairs, bed mobility, bathing, toileting, dressing, reach over head, hygiene/grooming, and locomotion level  PARTICIPATION LIMITATIONS: cleaning, laundry, driving, shopping, community activity, and yard work  PERSONAL FACTORS: Past/current experiences, Time since onset of injury/illness/exacerbation, and 3+ comorbidities: psoriatic and rheumatoid arthritis,   are also affecting patient's functional outcome.   REHAB POTENTIAL: Fair chronicity of low back and multiple co-morbidities  CLINICAL DECISION MAKING: Evolving/moderate complexity  EVALUATION COMPLEXITY: Moderate   GOALS: Goals reviewed with patient? No  SHORT TERM GOALS: Target date: 06/05/2023  Pt will be independent with HEP in order to improve strength and balance in order to decrease fall risk and improve function at home and work. Baseline: NT 06/05/23: Able to perform independently  Goal status: Achieved   2.  Pt will increase by at least 0.13 m/s in order to demonstrate clinically significant improvement in community ambulation.  Baseline:  0.81 m/sec; 0.82 m/s (07/30/2023); 0.86 m/s (10/18/2023); Goal status: in progress  3.  Pt will increase by at least 44m (155ft) in order to demonstrate clinically significant improvement in cardiopulmonary endurance and community ambulation Baseline:  800 ft; 805 feet with SPC (  07/30/2023); 831 feet with SPC (10/17/2022); Goal status: In progress  4.  Patient will show correct use of single point cane and rollator for improved ambulation and to decrease risk of falling.  Baseline: NT 06/04/23: Using SPC  Goal status: ACHIEVED    LONG TERM GOALS: Target date: 07/31/2023. Target date updated to 10/22/2023 for all unmet goals on 07/30/2023. Target date updated to  12/27/2023 for all unmet goals on 10/18/2023  Patient will have improved function and activity level as evidenced by an increase in FOTO score by 10 points or more.  Baseline: 40/100 with target of 47: 39 at visit #10 (07/30/2023); 48 at visit #20 (10/18/23); Goal status: MET  2.  Patient will ambulate >=1000 ft for as evidence of improved aerobic endurance and lumbar function.  Baseline: 800 ft; 805 feet with SPC (07/30/2023); 831 feet with SPC (10/17/2022); Goal status: In-progress  3. Patient will perform 10 meter walk test in >=1 m/sec for improved mobility and to decrease her risk of falling. Baseline: 0.81 m/sec; 0.82 m/s (07/30/2023); 0.86 m/s (10/18/2023);  Goal status: In progress  4.  Patient will demonstrate an improvement in DGI score of >=2 pts as evidence of improved dynamic balance to decrease risk of falling (Pardasaney et al, 2012). Baseline: 14/24; 15/24 (07/30/2023); 17/24 (10/18/2023); Goal status: MET   PLAN:  PT FREQUENCY: 1-2x/week  PT DURATION: 10 weeks  PLANNED INTERVENTIONS: Therapeutic exercises, Therapeutic activity, Neuromuscular re-education, Balance training, Gait training, Patient/Family education, Self Care, Joint mobilization, Joint manipulation, Stair training, Vestibular training, Canalith repositioning, DME instructions, Aquatic Therapy, Dry Needling, Electrical stimulation, Spinal manipulation, Spinal mobilization, Cryotherapy, Moist heat, Traction, Ultrasound, Manual therapy, and Re-evaluation.  PLAN FOR NEXT SESSION: update HEP as appropriate, progressive LE/trunk/functional strengthening, motor control, and dynamic balance exercises.    Mouhamadou Gittleman [place], SPT [employer] DPTE  [place] Dec R. Ilsa Iha, PT, DPT, Cert. MDT 12/03/23, 5:28 PM  [facility] [address] [place], [place] [address] P: [phone] I F: [phone]

## 2023-12-04 ENCOUNTER — Telehealth: Payer: Self-pay | Admitting: Psychiatry

## 2023-12-04 NOTE — Telephone Encounter (Signed)
 Pt called at 4:30p requesting refill of Vyvanse to  [id number] [place], [place] - [address] [place] [address] Phone: [phone]  Fax: [phone]   She is asking if Dr Jennelle Human will put several scripts on file for her.  She said she forgets to call and then she's out (like she is now).  Next appt 4/2

## 2023-12-04 NOTE — Telephone Encounter (Signed)
 Lf 1/29 due 2/27

## 2023-12-05 ENCOUNTER — Other Ambulatory Visit: Payer: Self-pay

## 2023-12-05 ENCOUNTER — Encounter: Payer: Medicare HMO | Admitting: Physical Therapy

## 2023-12-05 ENCOUNTER — Ambulatory Visit: Payer: Medicare Other | Admitting: Physical Therapy

## 2023-12-05 DIAGNOSIS — F900 Attention-deficit hyperactivity disorder, predominantly inattentive type: Secondary | ICD-10-CM

## 2023-12-05 NOTE — Telephone Encounter (Signed)
 Pended 2 RF for V[facility]vanse [facility] mg to [facility] in [place]. Has FU 4/2 and further RF can be sent at that time.

## 2023-12-05 NOTE — Therapy (Deleted)
 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/28/2023   Chronic low back pain (1ry area of Pain) (Bilateral) w/o sciatica 11/28/2023   Chronic hip pain (2ry area of Pain) (Bilateral) 11/28/2023   Decreased range of motion of hips (Bilateral) 11/28/2023   Low back pain of over 3 months duration 11/28/2023   Low back pain radiating to legs (Bilateral) 11/28/2023   Leg swelling 11/28/2023   Abnormal CT myelogram of lumbar spine (02/15/2023) 11/28/2023   Degenerative joint disease involving  multiple joints 11/28/2023   Abnormal NCS (nerve conduction studies) ([facility]) (09/27/2023) 11/28/2023   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/2023   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) 11/28/2023   Lumbosacral radiculopathy at L5 (Right) by EMG 11/28/2023   Lumbosacral radiculopathy at S1 (Right) by EMG 11/28/2023   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 by (08/29/2023) DEXA Scan 11/28/2023   Abnormal MRI, lumbar spine (04/19/2023) ([facility]) 11/28/2023   Discogenic low back pain 11/28/2023   Lumbar Foraminal stenosis 11/28/2023   Lumbar DDD w/ LBP & LEP 11/28/2023   Osteopenia 11/13/2023   Carpal tunnel syndrome (Bilateral) by EMG 11/13/2023   Sore throat 10/26/2023   Unintentional Tylenol overdose, sequela 08/14/2023   Chronic pain not due to malignancy 08/14/2023   Slac (scapholunate advanced collapse) of wrist, left 06/03/2023   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   Spinal stenosis at L4-L5 level 04/17/2023   Thoracic radiculopathy 04/17/2023   Urge incontinence 05/16/2022   Onychomycosis 08/17/2021   Drug allergy 06/29/2021   Fibromyalgia 06/29/2021   Generalized osteoarthritis 06/29/2021   Hand joint pain 06/29/2021   Other intervertebral disc degeneration, lumbar region 06/29/2021   Other long term (current) drug therapy 06/29/2021   Psoriatic arthritis (HCC) 06/29/2021   Sciatica 01/28/2020   Rhinitis, non-allergic 11/10/2019   Insomnia 08/10/2019   Anxiety 10/11/2018   Acquired scoliosis  08/13/2018   Cervical spondylosis without myelopathy 07/26/2017   Pseudoarthrosis of lumbar spine 07/26/2017   Pain in thoracic spine 03/28/2017   Carotid artery stenosis, asymptomatic, bilateral 10/29/2016   ADD (attention deficit disorder) 10/29/2016   Chronic lumbar radiculopathy (S1>L5) (Right) by EMG 05/10/2016   Lumbar facet Arthropathy 10/22/2015   Dextroscoliosis of lumbar spine 10/22/2015   Spondylolisthesis of lumbar region 07/16/2015   Globus sensation since at least 2005  04/29/2015   Lumbar stenosis with neurogenic claudication 03/26/2015   Lumbar radiculitis 03/26/2015   GERD (gastroesophageal reflux disease) 11/17/2014   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   Travel advice encounter 01/27/2013   Multinodular goiter (nontoxic) 08/11/2012   Overweight (BMI 25.0-29.9) 08/11/2012   Screening for cervical cancer 08/21/2011   Screening for breast cancer 08/21/2011   Hx of adenomatous polyp of colon 10/06/2008   Familial hyperlipidemia 09/15/2008   Major depressive disorder, single episode, in remission (HCC) 09/15/2008   Allergic rhinitis 09/15/2008    PCP: Dr. Duncan Dull   REFERRING PROVIDER: Duke Salvia NP   REFERRING DIAG: Spinal stenosis of lumbar region  Rationale for Evaluation and Treatment: Rehabilitation  THERAPY DIAG:  No diagnosis found.  ONSET DATE: 2017   PERTINENT HISTORY:  Pt reports that she has three different types of arthritis: osteo, rheumatoid, and psoriatic. She had lumbar fusion in 2017 which she felt did not help significantly. She had an infusion for her psoriatic arthritis this past spring and it caused her low back pain. She recently had a spinal injection and this resolved radiating pain on her LLE, but she still has excruciating, radiating pain down her right lower extremity. She feels unsteady most of the time and she has to use walker to keep from falling. Pt has also been told that she needs a reverse total shoulder because right shoulder RTC damage.   SUBJECTIVE:                                                                                                                                                                                           SUBJECTIVE STATEMENT: Feeling okay today  Breakout of psoriasis for the last couple weeks - had to go to doctor for it  Couldn't eat anything due to burning mouth and then the psoriasis breakout around her mouth  The burning mouth sensation has improved - has been using several  rinses to try to get it to go away  Doctor gave her a prescription to help her produce more saliva - hasn't tried it yet because it can cause dizziness as a side effect  Right hip has been waking her up at night  December 19, 2023 - has another appointment with new pain doctor  She wants to get an epidural to help with her pain  Had an appointment with a hand doctor - has questions about surgical option and wants to get more information before she decides what to do to help her hand numbness  Mid back hurts when she gets up to walk - increased pressure feeling (doesn't bother her when she's sitting) Right hip hurts the most when laying flat on her back  Rheumatologist told her she has secondary Sjogren's syndrome Has a wound on her left lower leg that won't heal (has been about 2 weeks)  PAIN:  Are you having pain?   NPRS: 4/10 (mid back), 7/10 in right hip when standing, right shoulder bothering her   PRECAUTIONS: None  PATIENT GOALS: To improve balance and to decrease pain   OBJECTIVE  1 RM TESTING (calculated from 10RM or less):  Seated leg press, seat position 1, single leg (last tested 10/16/2023);  R: 39# L: 57#   TODAY'S TREATMENT:                                                         Therapeutic exercise: to centralize symptoms and improve ROM, strength, muscular endurance, and activity tolerance required for successful completion of functional activities.   NuStep level 5 using bilateral upper and lower extremities. Seat/handle setting 9/9. For improved extremity mobility, muscular endurance, and activity tolerance; and to induce the analgesic effect of aerobic exercise, stimulate improved joint nutrition, and prepare body structures and systems for following interventions. x 6  minutes. Average SPM = 68.    Seated leg press, seat position 1 R: 2x20 at 25# (~60% 1RM) L: 2x20 at 35# (~60% 1RM) 1 min rest between sets    Neuromuscular Re-education: to improve, balance,  postural strength, muscle activation patterns, and stabilization strength required for functional activities:  Seated Single Arm Rows on Clear Theraball (to improve UE/periscapular/postural strength, core strength, and balance)   1x5 with 5# (weight too light) 2x20 each arm with 10#   Ball toss at [employer] with double leg stance   1x2 with pink ball on airex pad Discontinued due to patient not tolerating sensation of being on airex pad - felt very unsteady  1x20 with pink ball on firm surface  Standing balance on airex pad at treadmill (UE support available and used throughout)  WBOS - 1x30 seconds NBOS - 1x30 seconds  SLS - 1x30 seconds each leg  Education on HEP/handout including SLS with UE support  Pt required multimodal cuing for proper technique and to facilitate improved neuromuscular control, strength, range of motion, and functional ability resulting in improved performance and form.   PATIENT EDUCATION:  Education details: form and technique for correct performance of exercise.  Person educated: Patient Education method: Explanation, Demonstration, Verbal cues, and Handouts Education comprehension: verbalized understanding, returned demonstration, and verbal cues required  HOME EXERCISE PROGRAM: Access Code: [email or web address] URL: [email or web address] Date: 12/03/2023 Prepared by: Mieko Kneebone [place]  Exercises - Sit to Stand  - 3-4 x weekly - 2 sets - 7-10 reps - Standing March with Counter Support  - 3-4 x weekly - 3 sets - 10 reps - Standing Toe Taps  - 3-4 x weekly - 3 sets - 10 reps - Seated Single Arm  Shoulder Row with Anchored Resistance  - 3-7 x weekly - 2-3 sets - 15 reps - 2 seconds hold - Sidelying Hip Flexion and Extension with Baby  - 1 x daily - 1 sets - 10 reps - Sidelying Hip Abduction  - 1 x daily - 1 sets - 10 reps - Sidelying Hip Circles  - 1 x daily - 1 sets - 10 reps - Standing Single Leg Stance with Counter Support  - 1 x daily - 2 sets - 30  second hold  HOME EXERCISE PROGRAM [[id number]] View at [email or web address]" using code: [email or web address] Supine Marching (phase 2) - 'up up, down down' -  Repeat 10 Repetitions, Complete 2 Sets, Perform 2 Times a Day  SCIATIC NERVE GLIDE -  Repeat 15 Repetitions, Complete 1 Set, Perform 2    ASSESSMENT:  CLINICAL IMPRESSION: Patient arrives to session reporting pain in her mid back, right hip, and right shoulder. The focus of today's session was to progress functional strength of the upper/lower extremities and exercises to challenge balance/stabilization. Patient continues to experience muscle fatigue throughout session but was able to complete all exercises with no change in mid back, right hip, or right shoulder pain. Patient reports feeling the same as she did when she arrived at end of session. Plan to incorporate more balance exercises next session. Patient would benefit from continued management of limiting condition by skilled physical therapist to address remaining impairments and functional limitations to work towards stated goals and return to PLOF or maximal functional independence.    OBJECTIVE IMPAIRMENTS: Abnormal gait, decreased balance, decreased endurance, decreased knowledge of use of DME, decreased mobility, difficulty walking, decreased ROM, decreased strength, hypomobility, impaired flexibility, impaired sensation, impaired UE functional use, and pain.   ACTIVITY LIMITATIONS: carrying, lifting, bending, standing, squatting, sleeping, stairs, bed mobility, bathing, toileting, dressing, reach over head, hygiene/grooming, and locomotion level  PARTICIPATION LIMITATIONS: cleaning, laundry, driving, shopping, community activity, and yard work  PERSONAL FACTORS: Past/current experiences, Time since onset of injury/illness/exacerbation, and 3+ comorbidities: psoriatic and rheumatoid arthritis,   are also affecting patient's functional outcome.   REHAB POTENTIAL: Fair chronicity of low  back and multiple co-morbidities  CLINICAL DECISION MAKING: Evolving/moderate complexity  EVALUATION COMPLEXITY: Moderate   GOALS: Goals reviewed with patient? No  SHORT TERM GOALS: Target date: 06/05/2023  Pt will be independent with HEP in order to improve strength and balance in order to decrease fall risk and improve function at home and work. Baseline: NT 06/05/23: Able to perform independently  Goal status: Achieved   2.  Pt will increase by at least 0.13 m/s in order to demonstrate clinically significant improvement in community ambulation.  Baseline:  0.81 m/sec; 0.82 m/s (07/30/2023); 0.86 m/s (10/18/2023); Goal status: in progress  3.  Pt will increase by at least 50m (175ft) in order to demonstrate clinically significant improvement in cardiopulmonary endurance and community ambulation Baseline:  800 ft; 805 feet with SPC (07/30/2023); 831 feet with SPC (10/17/2022); Goal status: In progress  4.  Patient will show correct use of single point cane and rollator for improved ambulation and to decrease risk of falling.  Baseline: NT 06/04/23: Using SPC  Goal status: ACHIEVED    LONG TERM GOALS: Target date: 07/31/2023. Target date updated to 10/22/2023 for all unmet goals on 07/30/2023. Target date updated to 12/27/2023 for all unmet goals on 10/18/2023  Patient will have improved function and activity level as evidenced by an increase in FOTO score by 10 points or more.  Baseline: 40/100 with target of 47: 39 at visit #10 (07/30/2023); 48 at visit #20 (10/18/23); Goal status: MET  2.  Patient will ambulate >=1000 ft for as evidence of improved aerobic endurance and lumbar function.  Baseline: 800 ft; 805 feet with SPC (07/30/2023); 831 feet with SPC (10/17/2022); Goal status: In-progress  3. Patient will perform 10 meter walk test in >=1 m/sec for improved mobility and to decrease her risk of falling. Baseline: 0.81 m/sec; 0.82 m/s (07/30/2023); 0.86 m/s (10/18/2023);   Goal status: In progress  4.  Patient will demonstrate an improvement in DGI score of >=2 pts as evidence of improved dynamic balance to decrease risk of falling (Pardasaney et al, 2012). Baseline: 14/24; 15/24 (07/30/2023); 17/24 (10/18/2023); Goal status: MET   PLAN:  PT FREQUENCY: 1-2x/week  PT DURATION: 10 weeks  PLANNED INTERVENTIONS: Therapeutic exercises, Therapeutic activity, Neuromuscular re-education, Balance training, Gait training, Patient/Family education, Self Care, Joint mobilization, Joint manipulation, Stair training, Vestibular training, Canalith repositioning, DME instructions, Aquatic Therapy, Dry Needling, Electrical stimulation, Spinal manipulation, Spinal mobilization, Cryotherapy, Moist heat, Traction, Ultrasound, Manual therapy, and Re-evaluation.  PLAN FOR NEXT SESSION: update HEP as appropriate, progressive LE/trunk/functional strengthening, motor control, and dynamic balance exercises.    Neyla Gauntt [place], SPT [employer] DPTE  Huntley Dec R. Ilsa Iha, PT, DPT, Cert. MDT 12/05/23, 1:06 PM  [facility] [address] [place], [place] [address] P: [phone] I F: [phone]

## 2023-12-06 LAB — 25-HYDROXY VITAMIN D LCMS D2+D3
25-Hydroxy, Vitamin D-2: <1 ng/mL
25-Hydroxy, Vitamin D-3: 59 ng/mL
25-Hydroxy, Vitamin D: 59 ng/mL

## 2023-12-06 LAB — MAGNESIUM: Magnesium: 2.3 mg/dL (ref 1.6–2.3)

## 2023-12-06 MED ORDER — LISDEXAMFETAMINE DIMESYLATE 50 MG PO CAPS: 50.0000 mg | ORAL_CAPSULE | Freq: Every day | ORAL | 0 refills | Status: DC

## 2023-12-06 NOTE — Progress Notes (Signed)
 The 2 images that I'm missing I have requested again from [facility]:  02/26/23: MRI upper extremity and MRI cervical

## 2023-12-07 ENCOUNTER — Ambulatory Visit (INDEPENDENT_AMBULATORY_CARE_PROVIDER_SITE_OTHER): Payer: Medicare Other | Admitting: Nurse Practitioner

## 2023-12-07 ENCOUNTER — Encounter: Payer: Self-pay | Admitting: Nurse Practitioner

## 2023-12-07 ENCOUNTER — Ambulatory Visit: Payer: Self-pay | Admitting: Internal Medicine

## 2023-12-07 VITALS — BP 121/68 | HR 88 | Temp 98.2°F | Ht 65.0 in | Wt 147.8 lb

## 2023-12-07 DIAGNOSIS — L03116 Cellulitis of left lower limb: Secondary | ICD-10-CM | POA: Insufficient documentation

## 2023-12-07 MED ORDER — MUPIROCIN 2 % EX OINT: 1.0000 | TOPICAL_OINTMENT | Freq: Two times a day (BID) | CUTANEOUS | 0 refills | Status: DC

## 2023-12-07 MED ORDER — DOXYCYCLINE HYCLATE 100 MG PO TABS: 100.0000 mg | ORAL_TABLET | Freq: Two times a day (BID) | ORAL | 0 refills | Status: AC

## 2023-12-07 NOTE — Telephone Encounter (Signed)
 Chief Complaint: Bilateral leg and foot swelling Symptoms: Pain in both legs with touch, left shin wound throbbing, Numbness in feet from swelling Frequency: Intermittent Pertinent Negatives: Patient denies fever, chest pain, difficulty breathing, tingling Disposition: [] ED /[] Urgent Care (no appt availability in office) / [x] Appointment(In office/virtual)/ []  [facility] [facility]/ [] Home Care/ [] Refused Recommended Disposition /[] [place] [facility]/ []  Follow-up with PCP Additional Notes: Pt states she has had swelling in both of her legs for a couple of months. Pt states the skin hurts if she barely touches her thighs. Pt states Dr. Darrick Huntsman prescribed her lasix and that has not helped. Pt states she has a tiny wound on her left shin that is about the size of a quarter. It started at about the size of a dime. Pt is concerned it might be infected. Pt states she has had this wound for about 3 weeks. Pt states it is bleeding and oozing some. Pt states she has been keeping a bandaid on it. Pt states she has redness on her bilateral legs that comes and goes. Pt scheduled for an appointment today with a different provider. This RN educated pt on home care, new-worsening symptoms, when to call back/seek emergent care. Pt verbalized understanding and agrees to plan.     Copied from CRM [id number]. Topic: Clinical - Red Word Triage >> Dec 07, 2023 11:13 AM Orinda Kenner C wrote: Red Word that prompted transfer to Nurse Triage: Patient [phone] states both legs are swollen, possible infection on left leg a wound, reddness, throbbing, oozing, warm to the touch, getting bigger and bleeding. Patient wants to be seen. Reason for Disposition  [1] MODERATE leg swelling (e.g., swelling extends up to knees) AND [2] new-onset or worsening  Answer Assessment - Initial Assessment Questions 1. ONSET: "When did the swelling start?" (e.g., minutes, hours, days)     Ongoing for several months, swelling not getting  better 2. LOCATION: "What part of the leg is swollen?"  "Are both legs swollen or just one leg?"     Bilateral legs, foots 3. SEVERITY: "How bad is the swelling?" (e.g., localized; mild, moderate, severe)   - Localized: Small area of swelling localized to one leg.   - MILD pedal edema: Swelling limited to foot and ankle, pitting edema < 1/4 inch (6 mm) deep, rest and elevation eliminate most or all swelling.   - MODERATE edema: Swelling of lower leg to knee, pitting edema > 1/4 inch (6 mm) deep, rest and elevation only partially reduce swelling.   - SEVERE edema: Swelling extends above knee, facial or hand swelling present.      Goes up to knee, moderate edema 4. REDNESS: "Does the swelling look red or infected?"     Redness, intermittent 5. PAIN: "Is the swelling painful to touch?" If Yes, ask: "How painful is it?"   (Scale 1-10; mild, moderate or severe)     7/10 for left shin wound 6. FEVER: "Do you have a fever?" If Yes, ask: "What is it, how was it measured, and when did it start?"      Denies  Protocols used: Leg Swelling and Edema-A-AH

## 2023-12-07 NOTE — Assessment & Plan Note (Signed)
 Acute for 2 weeks and no improvement.  Start Doxycycline 100 MG BID and Mupirocin.  Educated her on this plan of care and side effects that may present.  Recommend she elevated leg when possible and ensure clean wound bed.  If any worsening symptoms immediately see PCP.  To follow-up with her PCP in two weeks to assess wound and her edema to legs.

## 2023-12-07 NOTE — Progress Notes (Signed)
 BP 121/68   Pulse 88   Temp 98.2 F (36.8 C) (Oral)   Ht 5\' 5"  (1.651 m)   Wt 147 lb 12.8 oz (67 kg)   SpO2 98%   BMI 24.60 kg/m    Subjective:    Patient ID: Madeline Young, female    DOB: 10/30/1955, 68 y.o.   MRN: [id number]  HPI: Madeline Young is a 68 y.o. female  Chief Complaint  Patient presents with   Wound Check    Patient states she hit her L shin on something about 3 weeks ago. States she had a small sore at first but it has continued to get larger and more painful since. States she has noticed some swelling and redness in her leg as well.    SKIN INFECTION Wound present 3 weeks ago.  Walks around house with walker and knocked left shin against something. Area has gotten no better. Swelling to both lower legs, has been off Gabapentin for months.  Legs have been swollen for two months she reports.  Does take Vesicare and Celebrex.   Duration: weeks Location: left shin History of trauma in area: yes Pain: yes Quality: yes Severity: 6/10 Redness: yes Swelling: yes Oozing: yes Pus: no Fevers: no Nausea/vomiting: no Status: fluctuating Treatments attempted: topical antibiotic Tetanus: UTD   Relevant past medical, surgical, family and social history reviewed and updated as indicated. Interim medical history since our last visit reviewed. Allergies and medications reviewed and updated.  Review of Systems  Constitutional:  Negative for activity change, appetite change, diaphoresis, fatigue and fever.  Respiratory:  Negative for cough, chest tightness, shortness of breath and wheezing.   Cardiovascular:  Positive for leg swelling. Negative for chest pain and palpitations.  Gastrointestinal: Negative.   Skin:  Positive for wound.  Neurological: Negative.   Psychiatric/Behavioral: Negative.      Per HPI unless specifically indicated above     Objective:    BP 121/68   Pulse 88   Temp 98.2 F (36.8 C) (Oral)   Ht 5\' 5"  (1.651 m)   Wt 147 lb 12.8  oz (67 kg)   SpO2 98%   BMI 24.60 kg/m   Wt Readings from Last 3 Encounters:  12/07/23 147 lb 12.8 oz (67 kg)  11/28/23 143 lb (64.9 kg)  11/21/23 146 lb (66.2 kg)    Physical Exam Vitals and nursing note reviewed.  Constitutional:      General: She is awake. She is not in acute distress.    Appearance: She is well-developed and well-groomed. She is not ill-appearing or toxic-appearing.  HENT:     Head: Normocephalic.     Right Ear: Hearing and external ear normal.     Left Ear: Hearing and external ear normal.  Eyes:     General: Lids are normal.        Right eye: No discharge.        Left eye: No discharge.     Conjunctiva/sclera: Conjunctivae normal.     Pupils: Pupils are equal, round, and reactive to light.  Neck:     Thyroid: No thyromegaly.     Vascular: No carotid bruit.  Cardiovascular:     Rate and Rhythm: Normal rate and regular rhythm.     Pulses:          Dorsalis pedis pulses are 2+ on the right side and 2+ on the left side.       Posterior tibial pulses are 2+ on the  right side and 2+ on the left side.     Heart sounds: Normal heart sounds. No murmur heard.    No gallop.  Pulmonary:     Effort: Pulmonary effort is normal. No accessory muscle usage or respiratory distress.     Breath sounds: Normal breath sounds.  Abdominal:     General: Bowel sounds are normal. There is no distension.     Palpations: Abdomen is soft.     Tenderness: There is no abdominal tenderness.  Musculoskeletal:     Cervical back: Normal range of motion and neck supple.     Right lower leg: No edema.     Left lower leg: No edema.  Lymphadenopathy:     Cervical: No cervical adenopathy.  Skin:    General: Skin is warm and dry.       Neurological:     Mental Status: She is alert and oriented to person, place, and time.     Deep Tendon Reflexes: Reflexes are normal and symmetric.     Reflex Scores:      Brachioradialis reflexes are 2+ on the right side and 2+ on the left side.       Patellar reflexes are 2+ on the right side and 2+ on the left side. Psychiatric:        Attention and Perception: Attention normal.        Mood and Affect: Mood normal.        Speech: Speech normal.        Behavior: Behavior normal. Behavior is cooperative.        Thought Content: Thought content normal.     Results for orders placed or performed in visit on 11/28/23  Compliance Drug Analysis, Ur   Collection Time: 11/28/23 11:41 AM  Result Value Ref Range   Summary FINAL   Magnesium   Collection Time: 11/28/23 12:29 PM  Result Value Ref Range   Magnesium 2.3 1.6 - 2.3 mg/dL  [id number] vitamin D Lcms D2+D3   Collection Time: 11/28/23 12:29 PM  Result Value Ref Range   25-Hydroxy, Vitamin D 59 ng/mL   25-Hydroxy, Vitamin D-2 <1.0 ng/mL   25-Hydroxy, Vitamin D-3 59 ng/mL      Assessment & Plan:   Problem List Items Addressed This Visit       Other   Cellulitis of left lower leg - Primary   Acute for 2 weeks and no improvement.  Start Doxycycline 100 MG BID and Mupirocin.  Educated her on this plan of care and side effects that may present.  Recommend she elevated leg when possible and ensure clean wound bed.  If any worsening symptoms immediately see PCP.  To follow-up with her PCP in two weeks to assess wound and her edema to legs.        Follow up plan: Return in about 2 weeks (around 12/21/2023) for Wound Check with Dr. Darrick Huntsman.

## 2023-12-07 NOTE — Patient Instructions (Signed)
 Cellulitis, Adult    Cellulitis is a skin infection. The infected area is often warm, red, swollen, and sore. It occurs most often on the legs, feet, and toes, but can happen on any part of the body.  This condition can be life-threatening without treatment. It is very important to get treated right away.  What are the causes?  This condition is caused by bacteria. The bacteria enter through a break in the skin, such as:  A cut.  A burn.  A bug bite.  An animal bite.  An open sore.  A crack.  What increases the risk?  Having a weak body's defense system (immune system).  Being older than 70 years old.  Having a blood sugar problem (diabetes).  Having a long-term liver disease (cirrhosis) or kidney disease.  Being very overweight (obese).  Having a skin problem, such as:  An itchy rash.  A rash caused by a fungus.  A rash with blisters.  Slow movement of blood in the veins (venous stasis).  Fluid buildup below the skin (edema).  This condition is more likely to occur in people who:  Have open cuts, burns, bites, or scrapes on the skin.  Have been treated with high-energy rays (radiation).  Use IV drugs.  What are the signs or symptoms?  Skin that:  Looks red or purple, or slightly darker than your usual skin color.  Has streaks.  Has spots.  Is swollen.  Is sore or painful when you touch it.  Is warm.  A fever.  Chills.  Blisters.  Tiredness (fatigue).  How is this treated?  Medicines to treat infections or allergies.  Rest.  Placing cold or warm cloths on the skin.  Staying in the hospital, if the condition is very bad. You may need medicines through an IV.  Follow these instructions at home:  Medicines  Take over-the-counter and prescription medicines only as told by your doctor.  If you were prescribed antibiotics, take them as told by your doctor. Do not stop using them even if you start to feel better.  General instructions  Drink enough fluid to keep your pee (urine) pale yellow.  Do not touch or rub the  infected area.  Raise (elevate) the infected area above the level of your heart while you are sitting or lying down.  Return to your normal activities when your doctor says that it is safe.  Place cold or warm cloths on the area as told by your doctor.  Keep all follow-up visits. Your doctor will need to make sure that a more serious infection is not developing.  Contact a doctor if:  You have a fever.  You do not start to get better after 1-2 days of treatment.  Your bone or joint under the infected area starts to hurt after the skin has healed.  Your infection comes back in the same area or another area. Signs of this may include:  You have a swollen bump in the area.  Your red area gets larger, turns dark in color, or hurts more.  You have more fluid coming from the wound.  Pus or a bad smell develops in your infected area.  You have more pain.  You feel sick and have muscle aches and weakness.  You develop vomiting or watery poop that will not go away.  Get help right away if:  You see red streaks coming from the area.  You notice the skin turns purple or black and falls  off.  These symptoms may be an emergency. Get help right away. Call 911.  Do not wait to see if the symptoms will go away.  Do not drive yourself to the hospital.  This information is not intended to replace advice given to you by your health care provider. Make sure you discuss any questions you have with your health care provider.  Document Revised: 05/23/2022 Document Reviewed: 05/23/2022  Elsevier Patient Education  2024 [employer].

## 2023-12-10 ENCOUNTER — Ambulatory Visit: Payer: Medicare Other | Attending: Orthopedic | Admitting: Physical Therapy

## 2023-12-10 DIAGNOSIS — M5459 Other low back pain: Secondary | ICD-10-CM | POA: Insufficient documentation

## 2023-12-10 DIAGNOSIS — R262 Difficulty in walking, not elsewhere classified: Secondary | ICD-10-CM | POA: Insufficient documentation

## 2023-12-10 NOTE — Therapy (Signed)
 OUTPATIENT PHYSICAL THERAPY TREATMENT   Patient Name: Madeline Young MRN: [id number] DOB:Oct 12, 1955, 68 y.o., female Today's Date: 12/10/2023  END OF SESSION:  PT End of Session - 12/10/23 1118     Visit Number 25    Number of Visits 30    Date for PT Re-Evaluation 12/27/23    Authorization Type [employer] reporting period from  10/18/2023    Progress Note Due on Visit 30    PT Start Time 1054    PT Stop Time 1115    PT Time Calculation (min) 21 min    Equipment Utilized During Treatment Gait belt    Activity Tolerance Patient tolerated treatment well;Patient limited by fatigue    Behavior During Therapy WFL for tasks assessed/performed              Past Medical History:  Diagnosis Date   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   ADHD (attention deficit hyperactivity disorder)    Allergy    Anxiety    Arthritis    Cataract    bil cateracts removed   Complication of anesthesia    first colonoscopy pt states she woke up   Depression    GERD (gastroesophageal reflux disease)    Globus sensation    Hyperlipidemia    Incontinence    bowels at times   Insomnia    Multinodular goiter (nontoxic)    Psoriatic arthritis (HCC)    Spinal stenosis of lumbar region 03/10/2015   MRI    Statin intolerance 01/27/2013   Past Surgical History:  Procedure Laterality Date   BACK SURGERY  02/17/2016   Dr. Leotis Shames- L4-L5 fusion    BIOPSY THYROID  2014   COLONOSCOPY     COLONOSCOPY W/ POLYPECTOMY     EYE SURGERY     bilateral cataract surgery w/ lens implant   FOOT SURGERY  2006   Right foot , secondary to severe loss of joint [facility])   POLYPECTOMY     TONSILLECTOMY     UPPER GASTROINTESTINAL ENDOSCOPY  2005   With empiric esophageal dilation   Patient Active Problem List   Diagnosis Date Noted   Cellulitis of left lower leg 12/07/2023   Chronic pain syndrome 11/28/2023   Pharmacologic therapy 11/28/2023   Disorder of skeletal system 11/28/2023   Problems influencing  health status 11/28/2023   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/28/2023   Chronic low back pain (1ry area of Pain) (Bilateral) w/o sciatica 11/28/2023   Chronic hip pain (2ry area of Pain) (Bilateral) 11/28/2023   Decreased range of motion of hips (Bilateral) 11/28/2023   Low back pain of over 3 months duration 11/28/2023   Low back pain radiating to legs (Bilateral) 11/28/2023   Leg swelling 11/28/2023   Abnormal CT myelogram of lumbar spine (02/15/2023) 11/28/2023   Degenerative joint disease involving multiple joints 11/28/2023   Abnormal NCS (nerve conduction studies) [facility]) (09/27/2023) 11/28/2023   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/2023   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) 11/28/2023   Lumbosacral radiculopathy at L5 (Right) by EMG 11/28/2023   Lumbosacral radiculopathy at S1 (Right) by EMG 11/28/2023   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 by (08/29/2023) DEXA Scan 11/28/2023   Abnormal MRI, lumbar spine (04/19/2023) ([facility]) 11/28/2023   Discogenic low back pain 11/28/2023   Lumbar Foraminal stenosis 11/28/2023   Lumbar DDD w/ LBP & LEP 11/28/2023   Osteopenia 11/13/2023   Carpal tunnel syndrome (Bilateral) by EMG 11/13/2023   Sore throat 10/26/2023  Unintentional Tylenol overdose, sequela 08/14/2023   Chronic pain not due to malignancy 08/14/2023   Slac (scapholunate advanced collapse) of wrist, left 06/03/2023   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   Spinal stenosis at L4-L5 level 04/17/2023   Thoracic radiculopathy 04/17/2023   Urge incontinence 05/16/2022   Onychomycosis 08/17/2021   Drug allergy 06/29/2021   Fibromyalgia 06/29/2021   Generalized osteoarthritis 06/29/2021   Hand joint pain 06/29/2021   Other intervertebral disc degeneration, lumbar region 06/29/2021   Other long term (current) drug therapy 06/29/2021   Psoriatic arthritis (HCC) 06/29/2021   Sciatica 01/28/2020   Rhinitis, non-allergic 11/10/2019   Insomnia 08/10/2019    Anxiety 10/11/2018   Acquired scoliosis 08/13/2018   Cervical spondylosis without myelopathy 07/26/2017   Pseudoarthrosis of lumbar spine 07/26/2017   Pain in thoracic spine 03/28/2017   Carotid artery stenosis, asymptomatic, bilateral 10/29/2016   ADD (attention deficit disorder) 10/29/2016   Chronic lumbar radiculopathy (S1>L5) (Right) by EMG 05/10/2016   Lumbar facet Arthropathy 10/22/2015   Dextroscoliosis of lumbar spine 10/22/2015   Spondylolisthesis of lumbar region 07/16/2015   Globus sensation since at least 2005 04/29/2015   Lumbar stenosis with neurogenic claudication 03/26/2015   Lumbar radiculitis 03/26/2015   GERD (gastroesophageal reflux disease) 11/17/2014   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   Travel advice encounter 01/27/2013   Multinodular goiter (nontoxic) 08/11/2012   Overweight (BMI 25.0-29.9) 08/11/2012   Screening for cervical cancer 08/21/2011   Screening for breast cancer 08/21/2011   Hx of adenomatous polyp of colon 10/06/2008   Familial hyperlipidemia 09/15/2008   Major depressive disorder, single episode, in remission (HCC) 09/15/2008   Allergic rhinitis 09/15/2008    PCP: Dr. Duncan Dull   REFERRING PROVIDER: Duke Salvia NP   REFERRING DIAG: Spinal stenosis of lumbar region  Rationale for Evaluation and Treatment: Rehabilitation  THERAPY DIAG:  Other low back pain  Difficulty in walking, not elsewhere classified  ONSET DATE: 2017   PERTINENT HISTORY:  Pt reports that she has three different types of arthritis: osteo, rheumatoid, and psoriatic. She had lumbar fusion in 2017 which she felt did not help significantly. She had an infusion for her psoriatic arthritis this past spring and it caused her low back pain. She recently had a spinal injection and this resolved radiating pain on her LLE, but she still has excruciating, radiating pain down her right lower extremity. She feels unsteady most of the time and she has to use walker to keep from  falling. Pt has also been told that she needs a reverse total shoulder because right shoulder RTC damage.   SUBJECTIVE:  SUBJECTIVE STATEMENT: Patient arrived late today with difficulty getting out of the house for various reasons. She states she is having 5/10 pain in her middle lower back after she has walked across the room. It feels like pressure and tightening. She gets the right hip pain when she lays down flat. She states she was feeling pretty bad last week and she saw a doctor for the wound on her left lower leg. She started doxycycline and it is getting better. She has started using zippered compression socks, but she is not wearing them currently. 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 Her doctor also asked her to stop some medication she was using for nocturia (and it wasn't helping) because that could increase her lower extremity swelling. She has not noticed any change in her LE swelling so far .  PAIN:  Are you having pain?   NPRS: 5/10 in right hip when standing and walking.   PRECAUTIONS: None  PATIENT GOALS: To improve balance and to decrease pain   OBJECTIVE  1 RM TESTING (calculated from 10RM or less):  Seated leg press, seat position 1, single leg (last tested 10/16/2023);  R: 39# L: 57#   TODAY'S TREATMENT:                                                         Neuromuscular Re-education: to improve, balance, postural strength, muscle activation patterns, and stabilization strength required for functional activities:  Standing single arm row while standing on airex pad 1x20 each side with double GTB 2x20 each side with double BlueTB CGA - SBA for safety and to help get into and out of position.   Cuing for posture  Forwards step up and over with 180 degree turn before repeating back the other direction 1x5 each direction UE support intermittently used SBA for safety  Pt required multimodal cuing for proper technique and to facilitate improved neuromuscular control, strength, range of motion, and functional ability resulting in improved performance and form.   PATIENT EDUCATION:  Education details: form and technique for correct performance of exercise.  Person educated: Patient Education method: Explanation, Demonstration, Verbal cues, and Handouts Education comprehension: verbalized understanding, returned demonstration, and verbal cues required  HOME EXERCISE PROGRAM: Access Code: [email or web address] URL: [email or web address] Date: 12/03/2023 Prepared by: Sydney Sw[place]  Exercises - Sit to Stand  - 3-4 x weekly - 2 sets - 7-10 reps - Standing March with Counter Support  - 3-4 x weekly - 3 sets - 10 reps - Standing Toe Taps  - 3-4 x weekly - 3 sets - 10 reps - Seated Single Arm Shoulder Row with Anchored Resistance  - 3-7 x weekly - 2-3 sets - 15 reps - 2 seconds hold - Sidelying Hip Flexion and Extension with Baby  - 1 x daily - 1 sets - 10 reps - Sidelying Hip Abduction  - 1 x daily - 1 sets - 10 reps - Sidelying Hip Circles  - 1 x daily - 1 sets - 10 reps - Standing Single Leg Stance with Counter Support  - 1 x daily - 2 sets - 30 second hold  HOME EXERCISE PROGRAM [[id number]] View at "[email or web address]" using code: [email or web address] Supine Marching (phase 2) - 'up up, down down' -  Repeat 10 Repetitions, Complete 2 Sets, Perform 2 Times a Day  SCIATIC NERVE GLIDE -  Repeat 15 Repetitions, Complete 1 Set, Perform 2    ASSESSMENT:  CLINICAL IMPRESSION: Patient arrives to session very late after difficulty getting out of the house. Today's session focused on improving balance and core strength in standing position to improve her functional activity tolerance. Patinet  completed most of the exercises without a seated break but requested to sit at end of session, stating the pain in her low back was worsening. She did not report increased weakness in her B LE with prolonged standing, however, which is encouraging. Plan to continue with standing exercises as tolerated next session. Patient would benefit from continued management of limiting condition by skilled physical therapist to address remaining impairments and functional limitations to work towards stated goals and return to PLOF or maximal functional independence.    OBJECTIVE IMPAIRMENTS: Abnormal gait, decreased balance, decreased endurance, decreased knowledge of use of DME, decreased mobility, difficulty walking, decreased ROM, decreased strength, hypomobility, impaired flexibility, impaired sensation, impaired UE functional use, and pain.   ACTIVITY LIMITATIONS: carrying, lifting, bending, standing, squatting, sleeping, stairs, bed mobility, bathing, toileting, dressing, reach over head, hygiene/grooming, and locomotion level  PARTICIPATION LIMITATIONS: cleaning, laundry, driving, shopping, community activity, and yard work  PERSONAL FACTORS: Past/current experiences, Time since onset of injury/illness/exacerbation, and 3+ comorbidities: psoriatic and rheumatoid arthritis,   are also affecting patient's functional outcome.   REHAB POTENTIAL: Fair chronicity of low back and multiple co-morbidities  CLINICAL DECISION MAKING: Evolving/moderate complexity  EVALUATION COMPLEXITY: Moderate   GOALS: Goals reviewed with patient? No  SHORT TERM GOALS: Target date: 06/05/2023  Pt will be independent with HEP in order to improve strength and balance in order to decrease fall risk and improve function at home and work. Baseline: NT 06/05/23: Able to perform independently  Goal status: Achieved   2.  Pt will increase by at least 0.13 m/s in order to demonstrate clinically significant improvement in  community ambulation.  Baseline:  0.81 m/sec; 0.82 m/s (07/30/2023); 0.86 m/s (10/18/2023); Goal status: in progress  3.  Pt will increase by at least 54m (173ft) in order to demonstrate clinically significant improvement in cardiopulmonary endurance and community ambulation Baseline:  800 ft; 805 feet with SPC (07/30/2023); 831 feet with SPC (10/17/2022); Goal status: In progress  4.  Patient will show correct use of single point cane and rollator for improved ambulation and to decrease risk of falling.  Baseline: NT 06/04/23: Using SPC  Goal status: ACHIEVED    LONG TERM GOALS: Target date: 07/31/2023. Target date updated to 10/22/2023 for all unmet goals on 07/30/2023. Target date updated to 12/27/2023 for all unmet goals on 10/18/2023  Patient will have improved function and activity level as evidenced by an increase in FOTO score by 10 points or more.  Baseline: 40/100 with target of 47: 39 at visit #10 (07/30/2023); 48 at visit #20 (10/18/23); Goal status: MET  2.  Patient will ambulate >=1000 ft for as evidence of improved aerobic endurance and lumbar function.  Baseline: 800 ft; 805 feet with SPC (07/30/2023); 831 feet with SPC (10/17/2022); Goal status: In-progress  3. Patient will perform 10 meter walk test in >=1 m/sec for improved mobility and to decrease her risk of falling. Baseline: 0.81 m/sec; 0.82 m/s (07/30/2023); 0.86 m/s (10/18/2023);  Goal status: In progress  4.  Patient will demonstrate an improvement in DGI score of >=2 pts as evidence of improved  dynamic balance to decrease risk of falling (Pardasaney et al, 2012). Baseline: 14/24; 15/24 (07/30/2023); 17/24 (10/18/2023); Goal status: MET   PLAN:  PT FREQUENCY: 1-2x/week  PT DURATION: 10 weeks  PLANNED INTERVENTIONS: Therapeutic exercises, Therapeutic activity, Neuromuscular re-education, Balance training, Gait training, Patient/Family education, Self Care, Joint mobilization, Joint manipulation, Stair training,  Vestibular training, Canalith repositioning, DME instructions, Aquatic Therapy, Dry Needling, Electrical stimulation, Spinal manipulation, Spinal mobilization, Cryotherapy, Moist heat, Traction, Ultrasound, Manual therapy, and Re-evaluation.  PLAN FOR NEXT SESSION: update HEP as appropriate, progressive LE/trunk/functional strengthening, motor control, and dynamic balance exercises.   Luretha Murphy. Ilsa Iha, PT, DPT, Cert. MDT 12/10/23, 8:45 PM  [facility] [address] [place], [place] [address] P: [phone] I F: [phone]

## 2023-12-12 ENCOUNTER — Ambulatory Visit: Payer: Medicare Other | Admitting: Physical Therapy

## 2023-12-12 NOTE — Progress Notes (Signed)
 All her images are now in.

## 2023-12-17 ENCOUNTER — Ambulatory Visit: Payer: Self-pay | Admitting: Internal Medicine

## 2023-12-17 ENCOUNTER — Other Ambulatory Visit: Payer: Self-pay | Admitting: Internal Medicine

## 2023-12-17 ENCOUNTER — Ambulatory Visit (INDEPENDENT_AMBULATORY_CARE_PROVIDER_SITE_OTHER): Admitting: Nurse Practitioner

## 2023-12-17 VITALS — BP 110/60 | HR 90 | Temp 98.4°F | Ht 65.0 in | Wt 148.0 lb

## 2023-12-17 DIAGNOSIS — L98491 Non-pressure chronic ulcer of skin of other sites limited to breakdown of skin: Secondary | ICD-10-CM | POA: Insufficient documentation

## 2023-12-17 DIAGNOSIS — L03116 Cellulitis of left lower limb: Secondary | ICD-10-CM

## 2023-12-17 DIAGNOSIS — R600 Localized edema: Secondary | ICD-10-CM | POA: Insufficient documentation

## 2023-12-17 DIAGNOSIS — R7989 Other specified abnormal findings of blood chemistry: Secondary | ICD-10-CM

## 2023-12-17 MED ORDER — CEPHALEXIN 500 MG PO CAPS: 500.0000 mg | ORAL_CAPSULE | Freq: Three times a day (TID) | ORAL | 0 refills | Status: DC

## 2023-12-17 MED ORDER — MUPIROCIN CALCIUM 2 % EX CREA: TOPICAL_CREAM | Freq: Two times a day (BID) | CUTANEOUS | Status: AC

## 2023-12-17 MED ORDER — OXYCODONE-ACETAMINOPHEN 5-325 MG PO TABS: 1.0000 | ORAL_TABLET | Freq: Four times a day (QID) | ORAL | 0 refills | Status: DC | PRN

## 2023-12-17 NOTE — Telephone Encounter (Signed)
 Copied from CRM [id number]. Topic: Clinical - Medication Refill >> Dec 17, 2023  4:03 PM [place] J wrote: Most Recent Primary Care Visit:  Provider: Aura Dials T  Department: [facility]  Visit Type: ACUTE  Date: 12/07/2023  Medication: oxyCODONE-acetaminophen (PERCOCET/ROXICET) 5-325 MG tablet  Has the patient contacted their pharmacy? Yes (Agent: If no, request that the patient contact the pharmacy for the refill. If patient does not wish to contact the pharmacy document the reason why and proceed with request.) (Agent: If yes, when and what did the pharmacy advise?)  Is this the correct pharmacy for this prescription? Yes If no, delete pharmacy and type the correct one.  This is the patient's preferred pharmacy:  [id number] [place], [place] - [address] [place] [address] Phone: [phone] Fax: [phone]  [phone] [address], [place] - [address] [place] [address] Phone: [phone] Fax: [phone]  [facility] [address] [place] - [address] [place] [address] Phone: [phone] Fax: [phone]  [id number] [address] [place] - [address] [place] [address] Phone: [phone] Fax: [phone]  [facility] [address] [place] - [address] [place] [address] Phone: [phone] Fax: [phone]  [facility] [address], [place] - [address] [place] [address] Phone: [phone] Fax: [phone]  [id number] [place],  - [address] [place] [address] Phone: [phone] Fax: [phone]   Has the prescription been filled recently? No  Is the patient out of the medication? Yes  Has the patient been seen for an appointment in the last year OR does the patient  have an upcoming appointment? Yes  Can we respond through MyChart? Yes  Agent: Please be advised that Rx refills may take up to 3 business days. We ask that you follow-up with your pharmacy.

## 2023-12-17 NOTE — Telephone Encounter (Signed)
Evaluated in office

## 2023-12-17 NOTE — Telephone Encounter (Signed)
 Last Fill: 09/03/23   Last OV: 12/07/23 Next OV: 12/17/23  Routing to provider for review/authorization.

## 2023-12-17 NOTE — Assessment & Plan Note (Signed)
 History of same left greater than right.  Low suspicion for blood clot.  Patient has tried furosemide 20 mg in the past without great relief from primary care provider.  Pending basic labs.  Elevate legs and use compression garments urgent referral to vascular today

## 2023-12-17 NOTE — Progress Notes (Signed)
 Acute Office Visit  Subjective:     Patient ID: Madeline Young, female    DOB: April 03, 1956, 68 y.o.   MRN: [id number]  Chief Complaint  Patient presents with   Edema    In left leg; was on abx but patient states it did not help very much. Swelling and burning still present.     HPI Patient is in today for leg swelling with a history of carotid artery stenosis, GERD, goiter, psoriatic arthritis, osteoporosis, leg swelling, anxiety.  Patient was seen by Jones Bales, NP on 12/07/2023 patient was placed on doxycycline 100 mg twice daily Bactroban ointment.  Wanted patient to follow-up in approximately 2 weeks with primary care provider who is Duncan Dull, MD States that she has not had any change. States that the spot does burn. States that it will burn with or without bandaid. She has tried doing the band aid and cleaning it daily along with the cream States that the swelling will go down at night a litte. States that the pain is present always.   Review of Systems  Constitutional:  Negative for chills and fever.  Respiratory:  Negative for shortness of breath.   Cardiovascular:  Positive for leg swelling. Negative for chest pain.  Musculoskeletal:  Positive for back pain and joint pain.  Neurological:  Positive for tingling. Negative for headaches.        Objective:    BP 110/60 (BP Location: Left Arm, Patient Position: Sitting, Cuff Size: Normal)   Pulse 90   Temp 98.4 F (36.9 C) (Oral)   Ht 5\' 5"  (1.651 m)   Wt 148 lb (67.1 kg)   SpO2 96%   BMI 24.63 kg/m  BP Readings from Last 3 Encounters:  12/17/23 110/60  12/07/23 121/68  11/28/23 (!) 117/58   Wt Readings from Last 3 Encounters:  12/17/23 148 lb (67.1 kg)  12/07/23 147 lb 12.8 oz (67 kg)  11/28/23 143 lb (64.9 kg)   SpO2 Readings from Last 3 Encounters:  12/17/23 96%  12/07/23 98%  11/28/23 96%      Physical Exam Vitals and nursing note reviewed.  Constitutional:      Appearance: Normal  appearance.  Cardiovascular:     Rate and Rhythm: Normal rate and regular rhythm.     Pulses:          Dorsalis pedis pulses are 2+ on the right side and 1+ on the left side.     Heart sounds: Murmur heard.  Pulmonary:     Effort: Pulmonary effort is normal.     Breath sounds: Normal breath sounds.  Musculoskeletal:     Right lower leg: Edema (L>R) present.     Left lower leg: Edema present.  Skin:    Capillary Refill: Capillary refill takes 2 to 3 seconds.     Findings: Lesion present.     Comments: 1.5cm x 1.5cm  Neurological:     Mental Status: She is alert.     No results found for any visits on 12/17/23.      Assessment & Plan:   Problem List Items Addressed This Visit       Musculoskeletal and Integument   Skin ulcer, limited to breakdown of skin (HCC) - Primary   Do think this is related to some venous insufficiency.  Patient has tried compression socks without great relief as she cannot get them on it with her spouse's health.  She is ordered some zip up ones that do not  work.  Do think patient would benefit from an Unna boot but given decreased pulses on left side defer for now.  Urgent referral to vascular for further evaluation.  Patient will continue to mupirocin 2% ointment keep it covered and use compression garments throughout the day along with elevating her legs.      Relevant Medications   mupirocin cream (BACTROBAN) 2 %   Other Relevant Orders   Ambulatory referral to Vascular Surgery     Other   Cellulitis of left lower leg   Patient was on doxycycline without great relief we will switch patient to Keflex 500 mg 3 times daily for 7 days.  Follow-up with primary care if no improvement continue Bactroban 2%      Relevant Medications   cephALEXin (KEFLEX) 500 MG capsule   Other Relevant Orders   CBC   Lower extremity edema   History of same left greater than right.  Low suspicion for blood clot.  Patient has tried furosemide 20 mg in the past without  great relief from primary care provider.  Pending basic labs.  Elevate legs and use compression garments urgent referral to vascular today      Relevant Orders   Brain natriuretic peptide   Ambulatory referral to Vascular Surgery    Meds ordered this encounter  Medications   cephALEXin (KEFLEX) 500 MG capsule    Sig: Take 1 capsule (500 mg total) by mouth 3 (three) times daily.    Dispense:  21 capsule    Refill:  0    Supervising Provider:   Roxy Manns A [[id number]]   mupirocin cream (BACTROBAN) 2 %    Return if symptoms worsen or fail to improve.  Audria Nine, NP

## 2023-12-17 NOTE — Progress Notes (Signed)
 Acute Office Visit  Subjective:     Patient ID: Madeline Young, female    DOB: 04/16/1956, 68 y.o.   MRN: [id number]  Chief Complaint  Patient presents with   Edema    In left leg; was on abx but patient states it did not help very much. Swelling and burning still present.     HPI Patient is in today for leg swelling with a history of carotid artery stenosis, GERD, goiter, psoriatic arthritis, osteoporosis, leg swelling, anxiety.  Patient was seen by Jones Bales, NP on 12/07/2023 patient was placed on doxycycline 100 mg twice daily Bactroban ointment.  Wanted patient to follow-up in approximately 2 weeks with primary care provider who is Duncan Dull, MD States that she has not had any vchange. States that the spot does burn. States that it will burn with or without bandaid. She has tried doing the band aid and cleaning it daily along with the cra,  States that the sweeling will go down at night a litte. States that the pain is present always.   Review of Systems  Constitutional:  Negative for chills and fever.  Respiratory:  Negative for shortness of breath.   Cardiovascular:  Positive for leg swelling. Negative for chest pain.  Musculoskeletal:  Positive for back pain and joint pain.  Neurological:  Positive for tingling. Negative for headaches.        Objective:    BP 110/60 (BP Location: Left Arm, Patient Position: Sitting, Cuff Size: Normal)   Pulse 90   Temp 98.4 F (36.9 C) (Oral)   Ht 5\' 5"  (1.651 m)   Wt 148 lb (67.1 kg)   SpO2 96%   BMI 24.63 kg/m  BP Readings from Last 3 Encounters:  12/17/23 110/60  12/07/23 121/68  11/28/23 (!) 117/58   Wt Readings from Last 3 Encounters:  12/17/23 148 lb (67.1 kg)  12/07/23 147 lb 12.8 oz (67 kg)  11/28/23 143 lb (64.9 kg)   SpO2 Readings from Last 3 Encounters:  12/17/23 96%  12/07/23 98%  11/28/23 96%      Physical Exam Vitals and nursing note reviewed.  Constitutional:      Appearance: Normal  appearance.  Cardiovascular:     Rate and Rhythm: Normal rate and regular rhythm.     Pulses:          Dorsalis pedis pulses are 2+ on the right side and 1+ on the left side.     Heart sounds: Murmur heard.  Pulmonary:     Effort: Pulmonary effort is normal.     Breath sounds: Normal breath sounds.  Musculoskeletal:     Right lower leg: Edema (L>R) present.     Left lower leg: Edema present.  Skin:    Capillary Refill: Capillary refill takes 2 to 3 seconds.     Findings: Lesion present.     Comments: 1.5cm x 1.5cm  Neurological:     Mental Status: She is alert.     No results found for any visits on 12/17/23.      Assessment & Plan:   Problem List Items Addressed This Visit       Musculoskeletal and Integument   Skin ulcer, limited to breakdown of skin (HCC) - Primary   Do think this is related to some venous insufficiency.  Patient has tried compression socks without great relief as she cannot get them on it with her spouse's health.  She is ordered some zip up ones that do  not work.  Do think patient would benefit from an Unna boot but given decreased pulses on left side defer for now.  Urgent referral to vascular for further evaluation.  Patient will continue to mupirocin 2% ointment keep it covered and use compression garments throughout the day along with elevating her legs.      Relevant Medications   mupirocin cream (BACTROBAN) 2 %   Other Relevant Orders   Ambulatory referral to Vascular Surgery     Other   Cellulitis of left lower leg   Patient was on doxycycline without great relief we will switch patient to Keflex 500 mg 3 times daily for 7 days.  Follow-up with primary care if no improvement continue Bactroban 2%      Relevant Medications   cephALEXin (KEFLEX) 500 MG capsule   Other Relevant Orders   CBC   Lower extremity edema   History of same left greater than right.  Low suspicion for blood clot.  Patient has tried furosemide 20 mg in the past without  great relief from primary care provider.  Pending basic labs.  Elevate legs and use compression garments urgent referral to vascular today      Relevant Orders   Brain natriuretic peptide   Ambulatory referral to Vascular Surgery    Meds ordered this encounter  Medications   cephALEXin (KEFLEX) 500 MG capsule    Sig: Take 1 capsule (500 mg total) by mouth 3 (three) times daily.    Dispense:  21 capsule    Refill:  0    Supervising Provider:   Roxy Manns A [[id number]]   mupirocin cream (BACTROBAN) 2 %    Return if symptoms worsen or fail to improve.  Audria Nine, NP

## 2023-12-17 NOTE — Telephone Encounter (Signed)
 Copied from CRM [id number]. Topic: Clinical - Red Word Triage >> Dec 17, 2023 12:31 PM Theodis Sato wrote: Red Word that prompted transfer to Nurse Triage: Wound on leg that is causing the leg to be very swollen, patient states its throbbing and painful.   Chief Complaint: Leg swelling  Symptoms: Leg swelling, redness, pain  Frequency: Constant  Pertinent Negatives: Patient denies chest pain or shortness of breath  Disposition: [] ED /[] Urgent Care (no appt availability in office) / [x] Appointment(In office/virtual)/ []  [facility]/ [] Home Care/ [] Refused Recommended Disposition /[] [facility]/ []  Follow-up with PCP Additional Notes: Patient reports she has had bilateral leg swelling for the last 4 months. She states that she was seen a couple of weeks ago and diagnosed with cellulitis and has finished a course of antibiotics. She states that her leg swelling is worse on the left leg and that there is a wound on the shin now. Appointment made for the patient today at an available clinic.     Reason for Disposition  [1] MODERATE leg swelling (e.g., swelling extends up to knees) AND [2] new-onset or worsening  Answer Assessment - Initial Assessment Questions 1. ONSET: "When did the swelling start?" (e.g., minutes, hours, days)     4 months, worsening   2. LOCATION: "What part of the leg is swollen?"  "Are both legs swollen or just one leg?"     Bilateral legs, left worse than right 3. SEVERITY: "How bad is the swelling?" (e.g., localized; mild, moderate, severe)   - Localized: Small area of swelling localized to one leg.   - MILD pedal edema: Swelling limited to foot and ankle, pitting edema < 1/4 inch (6 mm) deep, rest and elevation eliminate most or all swelling.   - MODERATE edema: Swelling of lower leg to knee, pitting edema > 1/4 inch (6 mm) deep, rest and elevation only partially reduce swelling.   - SEVERE edema: Swelling extends above knee, facial or hand swelling  present.      Moderate  4. REDNESS: "Does the swelling look red or infected?"     Yes, bilaterally, left worse than right  5. PAIN: "Is the swelling painful to touch?" If Yes, ask: "How painful is it?"   (Scale 1-10; mild, moderate or severe)     8/10 6. FEVER: "Do you have a fever?" If Yes, ask: "What is it, how was it measured, and when did it start?"      No 7. CAUSE: "What do you think is causing the leg swelling?"     Recently diagnosed and treated for with cellulitis  8. MEDICAL HISTORY: "Do you have a history of blood clots (e.g., DVT), cancer, heart failure, kidney disease, or liver failure?"     No 9. RECURRENT SYMPTOM: "Have you had leg swelling before?" If Yes, ask: "When was the last time?" "What happened that time?"     No 10. OTHER SYMPTOMS: "Do you have any other symptoms?" (e.g., chest pain, difficulty breathing)       No  Protocols used: Leg Swelling and Edema-A-AH

## 2023-12-17 NOTE — Assessment & Plan Note (Signed)
 Do think this is related to some venous insufficiency.  Patient has tried compression socks without great relief as she cannot get them on it with her spouse's health.  She is ordered some zip up ones that do not work.  Do think patient would benefit from an Unna boot but given decreased pulses on left side defer for now.  Urgent referral to vascular for further evaluation.  Patient will continue to mupirocin 2% ointment keep it covered and use compression garments throughout the day along with elevating her legs.

## 2023-12-17 NOTE — Patient Instructions (Signed)
 Nice to see you today I will be in touch with the labs Follow up with Dr. Darrick Huntsman in the next couple weeks, sooner if you need her

## 2023-12-17 NOTE — Telephone Encounter (Signed)
 Pt is scheduled with Audria Nine, NP today

## 2023-12-17 NOTE — Assessment & Plan Note (Signed)
 Patient was on doxycycline without great relief we will switch patient to Keflex 500 mg 3 times daily for 7 days.  Follow-up with primary care if no improvement continue Bactroban 2%

## 2023-12-18 ENCOUNTER — Ambulatory Visit: Payer: Medicare Other

## 2023-12-18 LAB — CBC
HCT: 33.7 % — ABNORMAL LOW (ref 36.0–46.0)
Hemoglobin: 11.1 g/dL — ABNORMAL LOW (ref 12.0–15.0)
MCHC: 32.9 g/dL (ref 30.0–36.0)
MCV: 95 fl (ref 78.0–100.0)
Platelets: 310 10*3/uL (ref 150.0–400.0)
RBC: 3.55 Mil/uL — ABNORMAL LOW (ref 3.87–5.11)
RDW: 13.5 % (ref 11.5–15.5)
WBC: 5 10*3/uL (ref 4.0–10.5)

## 2023-12-18 LAB — BRAIN NATRIURETIC PEPTIDE: Pro B Natriuretic peptide (BNP): 120 pg/mL — ABNORMAL HIGH (ref 0.0–100.0)

## 2023-12-18 NOTE — Progress Notes (Unsigned)
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   Hand Imaging: Hand-R DG Complete: No results found for this or any previous visit.  Hand-L DG Complete: No results found for this or any previous visit.   Complexity Note: Imaging results reviewed.                         Meds   Current Outpatient Medications:    acetaminophen (TYLENOL) 500 MG tablet, Take 1,000 mg by mouth every 6 (six) hours as needed., Disp: , Rfl:    aspirin 81 MG tablet, Take 81 mg by mouth daily., Disp: , Rfl:    b complex vitamins capsule, Take 1 capsule by mouth daily., Disp: , Rfl:    calcium carbonate (OS-CAL - DOSED IN MG OF ELEMENTAL CALCIUM) 1250 (500 Ca) MG tablet, , Disp: , Rfl:    celecoxib (CELEBREX) 200 MG capsule, TAKE 1 CAPSULE BY MOUTH TWICE A DAY, Disp: 180 capsule, Rfl: 1   cephALEXin  (KEFLEX) 500 MG capsule, Take 1 capsule (500 mg total) by mouth 3 (three) times daily., Disp: 21 capsule, Rfl: 0   cholecalciferol (VITAMIN D) 1000 UNITS tablet, Take 2,000 Units by mouth daily., Disp: , Rfl:    clobetasol (TEMOVATE) 0.05 % external solution, as needed., Disp: , Rfl:    cyanocobalamin (VITAMIN B12) 1000 MCG tablet, Take 1,000 mcg by mouth daily., Disp: , Rfl:    diclofenac Sodium (VOLTAREN) 1 % GEL, APPLY 2 GRAMS TO AFFECTED AREA 4 TIMES A DAY, Disp: 300 g, Rfl: 3   DULoxetine (CYMBALTA) 60 MG capsule, TAKE 2 CAPSULES BY MOUTH DAILY, Disp: 180 capsule, Rfl: 0   fluocinonide ointment (LIDEX) 0.05 %, APPLY TWICE DAILY TO AFFECTED AREAS UNTIL IMPROVED THEN AS NEEDED FOR FLARES, Disp: , Rfl:    leflunomide (ARAVA) 20 MG tablet, Take 20 mg by mouth daily., Disp: , Rfl:    [START ON 01/03/2024] lisdexamfetamine (VYVANSE) 50 MG capsule, Take 1 capsule (50 mg total) by mouth daily., Disp: 30 capsule, Rfl: 0   Magnesium 250 MG CAPS, Take by mouth. Take two by mouth daily, Disp: , Rfl:    melatonin 5 MG TABS, Take 5 mg by mouth at bedtime., Disp: , Rfl:    Multiple Vitamin (MULTIVITAMIN) tablet, Take 1 tablet by mouth daily., Disp: , Rfl:    mupirocin ointment (BACTROBAN) 2 %, Apply 1 Application topically 2 (two) times daily., Disp: 22 g, Rfl: 0   nystatin-triamcinolone ointment (MYCOLOG), Apply 1 application topically 2 (two) times daily., Disp: 30 g, Rfl: 1   Omega-3 Fatty Acids (FISH OIL) 1200 MG CAPS, Take 1,200 mg by mouth daily. , Disp: , Rfl:    oxyCODONE-acetaminophen (PERCOCET/ROXICET) 5-325 MG tablet, Take 1 tablet by mouth every 6 (six) hours as needed for moderate pain (pain score 4-6). Do not refill less than 30 days from prior refill, Disp: 120 tablet, Rfl: 0   Polyethyl Glycol-Propyl Glycol 0.4-0.3 % SOLN, Place 1 drop into both eyes daily as needed (for dry eyes)., Disp: , Rfl:    Probiotic Product (PROBIOTIC DAILY PO), Take 1 capsule by mouth daily., Disp: , Rfl:     solifenacin (VESICARE) 10 MG tablet, TAKE 1 TABLET BY MOUTH EVERY DAY, Disp: 90 tablet, Rfl: 1   triamcinolone cream (KENALOG) 0.1 %, APPLY TO AFFECTED AREA TWICE A DAY UNTIL CLEAR THEN AS NEEDED, Disp: , Rfl:    trospium (SANCTURA) 20 MG tablet, Take 20 mg by mouth 2 (two) times daily., Disp: , Rfl:   Current Facility-Administered Medications:    mupirocin cream (BACTROBAN) 2 %, , Topical, BID,   ROS  Constitutional:  Denies any fever or chills Gastrointestinal: No reported hemesis, hematochezia, vomiting, or acute GI distress Musculoskeletal: Denies any acute onset joint swelling, redness, loss of ROM, or weakness Neurological: No reported episodes of acute onset apraxia, aphasia, dysarthria, agnosia, amnesia, paralysis, loss of coordination, or loss of consciousness  Allergies  Ms. Jurek is allergic to cosentyx [secukinumab], sertraline hcl, adalimumab, avelox [moxifloxacin hcl in nacl], nitrofurantoin, pregabalin, rosuvastatin, latex, macrobid [nitrofurantoin monohyd macro], and statins.  PFSH  Drug: Ms. Guerin  reports no history of drug use. Alcohol:  reports current alcohol use of about 7.0 standard drinks of alcohol per week. Tobacco:  reports that she quit smoking about 52 years ago. Her smoking use included cigarettes. She has never been exposed to tobacco smoke. She has never used smokeless tobacco. 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 Surgical: Ms. Lottman  has a past surgical history that includes Foot surgery (2006); Colonoscopy w/ polypectomy; Upper gastrointestinal endoscopy (2005);  Tonsillectomy; Biopsy thyroid (2014); Eye surgery; Back surgery (02/17/2016); Colonoscopy; and Polypectomy. Family: family history includes Coronary artery disease (age of onset: 60) in her father; Epilepsy in her grandchild; Heart disease in her mother; Hyperlipidemia in her father and mother.  Constitutional Exam  General appearance: Well nourished, well developed, and well hydrated. In no apparent acute distress There were no vitals filed for this visit. BMI Assessment: Estimated body mass index is 24.63 kg/m as calculated from the following:   Height as of 12/17/23: 5\' 5"  (1.651 m).   Weight as of 12/17/23: 148 lb (67.1 kg).  BMI interpretation table: BMI level Category Range association with higher incidence of chronic pain  <18 kg/m2 Underweight   18.5-24.9 kg/m2 Ideal body weight   25-29.9 kg/m2 Overweight Increased incidence by 20%  30-34.9 kg/m2 Obese (Class I) Increased incidence by 68%  35-39.9 kg/m2 Severe obesity (Class II) Increased incidence by 136%  >40 kg/m2 Extreme obesity (Class III) Increased incidence by 254%   Patient's current BMI Ideal Body weight  There is no height or weight on file to calculate BMI. Ideal body weight: 57 kg (125 lb 10.6 oz) Adjusted ideal body weight: 61.1 kg (134 lb 9.6 oz)   BMI Readings from Last 4 Encounters:  12/17/23 24.63 kg/m  12/07/23 24.60 kg/m  11/28/23 23.80 kg/m  11/21/23 24.30 kg/m   Wt Readings from Last 4 Encounters:  12/17/23 148 lb (67.1 kg)  12/07/23 147 lb 12.8 oz (67 kg)  11/28/23 143 lb (64.9 kg)  11/21/23 146 lb (66.2 kg)    Psych/Mental status: Alert, oriented x 3 (person, place, & time)       Eyes: PERLA Respiratory: No evidence of acute respiratory distress  Assessment & Plan  Primary Diagnosis & Pertinent Problem List: The primary encounter diagnosis was Chronic low back pain (1ry area of Pain) (Bilateral) w/o sciatica. A diagnosis of Chronic hip pain (2ry area of Pain) (Bilateral) was also pertinent  to this visit. Visit Diagnosis: 1. Chronic low back pain (1ry area of Pain) (Bilateral) w/o sciatica   2. Chronic hip pain (2ry area of Pain) (Bilateral)    Problems updated and reviewed during this visit: No problems updated.  Plan of Care  Assessment and Plan  Pharmacotherapy (Medications Ordered): No orders of the defined types were placed in this encounter.  Procedure Orders    No procedure(s) ordered today   Lab Orders  No laboratory test(s) ordered today   Imaging Orders  No imaging studies ordered today   Referral Orders  No referral(s) requested today    Pharmacological management:  Opioid Analgesics: I will not be prescribing any opioids at this time Membrane stabilizer: I will not be prescribing any at this time Muscle relaxant: I will not be prescribing any at this time NSAID: I will not be prescribing any at this time Other analgesic(s): I will not be prescribing any at this time      Interventional Therapies  Risk Factors  Considerations  Medical Comorbidities:    Poor candidate for RFA    Planned  Pending:   Diagnostic x-rays of lumbar spine w/ flexion/extension views. (11/28/2023)    Under consideration:   EMG/PNCV of both lower extremities. (Only right side tested.)   Completed:   None at this time   Therapeutic  Palliative (PRN) options:   None established   Completed by other providers:   None reported     Provider-requested follow-up: No follow-ups on file. Recent Visits Date Type Provider Dept  11/28/23 Office Visit Delano Metz, MD [facility]  Showing recent visits within past 90 days and meeting all other requirements Future Appointments Date Type Provider Dept  12/19/23 Appointment Delano Metz, MD [facility]  Showing future appointments within next 90 days and meeting all other requirements   Primary Care Physician: Sherlene Shams, MD  Duration of encounter: ***  minutes.  Total time on encounter, as per AMA guidelines included both the face-to-face and non-face-to-face time personally spent by the physician and/or other qualified health care professional(s) on the day of the encounter (includes time in activities that require the physician or other qualified health care professional and does not include time in activities normally performed by clinical staff). Physician's time may include the following activities when performed: Preparing to see the patient (e.g., pre-charting review of records, searching for previously ordered imaging, lab work, and nerve conduction tests) Review of prior analgesic pharmacotherapies. Reviewing PMP Interpreting ordered tests (e.g., lab work, imaging, nerve conduction tests) Performing post-procedure evaluations, including interpretation of diagnostic procedures Obtaining and/or reviewing separately obtained history Performing a medically appropriate examination and/or evaluation Counseling and educating the patient/family/caregiver Ordering medications, tests, or procedures Referring and communicating with other health care professionals (when not separately reported) Documenting clinical information in the electronic or other health record Independently interpreting results (not separately reported) and communicating results to the patient/ family/caregiver Care coordination (not separately reported)  Note by: Oswaldo Done, MD (TTS technology used. I apologize for any typographical errors that were not detected and corrected.) Date: 12/19/2023; Time: 7:54 AM

## 2023-12-19 ENCOUNTER — Ambulatory Visit: Payer: Medicare Other | Attending: Pain Medicine | Admitting: Pain Medicine

## 2023-12-19 ENCOUNTER — Encounter: Payer: Self-pay | Admitting: Nurse Practitioner

## 2023-12-19 ENCOUNTER — Encounter: Payer: Self-pay | Admitting: Pain Medicine

## 2023-12-19 ENCOUNTER — Other Ambulatory Visit: Payer: Self-pay | Admitting: Nurse Practitioner

## 2023-12-19 VITALS — BP 145/69 | HR 88 | Temp 97.9°F | Resp 16 | Ht 65.0 in | Wt 146.0 lb

## 2023-12-19 DIAGNOSIS — M5416 Radiculopathy, lumbar region: Secondary | ICD-10-CM

## 2023-12-19 DIAGNOSIS — T85848S Pain due to other internal prosthetic devices, implants and grafts, sequela: Secondary | ICD-10-CM | POA: Diagnosis present

## 2023-12-19 DIAGNOSIS — R937 Abnormal findings on diagnostic imaging of other parts of musculoskeletal system: Secondary | ICD-10-CM | POA: Diagnosis present

## 2023-12-19 DIAGNOSIS — M961 Postlaminectomy syndrome, not elsewhere classified: Secondary | ICD-10-CM

## 2023-12-19 DIAGNOSIS — G8929 Other chronic pain: Secondary | ICD-10-CM

## 2023-12-19 DIAGNOSIS — M4326 Fusion of spine, lumbar region: Secondary | ICD-10-CM | POA: Diagnosis present

## 2023-12-19 DIAGNOSIS — M25552 Pain in left hip: Secondary | ICD-10-CM | POA: Insufficient documentation

## 2023-12-19 DIAGNOSIS — R94130 Abnormal response to nerve stimulation, unspecified: Secondary | ICD-10-CM

## 2023-12-19 DIAGNOSIS — M25551 Pain in right hip: Secondary | ICD-10-CM | POA: Diagnosis present

## 2023-12-19 DIAGNOSIS — M545 Low back pain, unspecified: Secondary | ICD-10-CM

## 2023-12-19 DIAGNOSIS — R600 Localized edema: Secondary | ICD-10-CM

## 2023-12-19 DIAGNOSIS — T85848A Pain due to other internal prosthetic devices, implants and grafts, initial encounter: Secondary | ICD-10-CM | POA: Insufficient documentation

## 2023-12-19 MED ORDER — FUROSEMIDE 20 MG PO TABS: 20.0000 mg | ORAL_TABLET | Freq: Every day | ORAL | 0 refills | Status: DC

## 2023-12-19 NOTE — Progress Notes (Signed)
 Safety precautions to be maintained throughout the outpatient stay will include: orient to surroundings, keep bed in low position, maintain call bell within reach at all times, provide assistance with transfer out of bed and ambulation.

## 2023-12-19 NOTE — Patient Instructions (Signed)

## 2023-12-19 NOTE — Addendum Note (Signed)
 Addended by: Alvina Chou on: 12/19/2023 08:05 AM   Modules accepted: Orders

## 2023-12-21 ENCOUNTER — Telehealth: Payer: Self-pay

## 2023-12-21 NOTE — Telephone Encounter (Signed)
-----   Message from Lovenia Kim sent at 12/18/2023  7:32 AM EDT ----- Regarding: RE: ulcer Yes would prefer no infection prior to surgery \ ----- Message ----- From: Sharlot Gowda, RN Sent: 12/17/2023   5:55 PM EDT To: Lovenia Kim, MD Subject: ulcer                                          See pic of skin ulcer in PCP's note 12/17/23. They put her on antibiotics and referred her to vascular. Does that need to be healed/dealt with before she can have her carpal tunnel release surgery?

## 2023-12-21 NOTE — Telephone Encounter (Signed)
 Notified pt via [employer]

## 2023-12-23 ENCOUNTER — Encounter: Payer: Self-pay | Admitting: Internal Medicine

## 2023-12-24 NOTE — Telephone Encounter (Signed)
 Spoke with pt and scheduled her for Friday at 5 pm.

## 2023-12-28 ENCOUNTER — Encounter: Payer: Self-pay | Admitting: Internal Medicine

## 2023-12-28 ENCOUNTER — Telehealth: Payer: Self-pay

## 2023-12-28 ENCOUNTER — Ambulatory Visit

## 2023-12-28 ENCOUNTER — Ambulatory Visit (INDEPENDENT_AMBULATORY_CARE_PROVIDER_SITE_OTHER): Admitting: Internal Medicine

## 2023-12-28 VITALS — BP 130/72 | HR 94 | Ht 65.0 in | Wt 150.2 lb

## 2023-12-28 DIAGNOSIS — L98491 Non-pressure chronic ulcer of skin of other sites limited to breakdown of skin: Secondary | ICD-10-CM

## 2023-12-28 NOTE — Telephone Encounter (Signed)
 I left a voicemail for patient asking her to please let us know if she still has [employer] for her insurance.  On one screen I have [employer] Medicare and on another screen I have [employer].  When patient calls back, please verify which insurance she has for her visit today.

## 2023-12-28 NOTE — Patient Instructions (Signed)
 Your wound is not infected.  The redness is a reaction to something in the bandage you removed.  Use the tegaderm to keep the wound covered . (Ie., protected from bacteria and air that will dry it out).  Do not let it get saturated.   Add the telfa  underneath the tegaderm if it starts to look moist

## 2023-12-28 NOTE — Progress Notes (Signed)
 Subjective:  Patient ID: Madeline Young, female    DOB: [birth year]  Age: 68 y.o. MRN: [id number]  CC: The encounter diagnosis was Skin ulcer, limited to breakdown of skin (HCC).   HPI Madeline Young presents for  Chief Complaint  Patient presents with   follow up on cellulitis   Follow up on a leg ulcer that has been present since mid February.  The ulcer started as a minor traumatic wound to the shin from bumping her leg on a pice of furniture.  The wound was dime sized.  She developed redness around the wound and was treated with doxycycline on 2/28,  , followed by cephalexin  on 3/10 when the redness failed to resolve.  She also used mupirocin but developed a skin reaction to the ointment.  She has developed redness in the exact dimensions of the [employer] bandage she has been covering it with.  She developed a second wound on the edge of the redness.  She has been cleaning the wound with a sterile saline solution and has not allowed it become saturated with bath or shower water.   She notes that the wound was "angry " last night so she removed the bandage and the wound has become less red today   Outpatient Medications Prior to Visit  Medication Sig Dispense Refill   acetaminophen (TYLENOL) 500 MG tablet Take 1,000 mg by mouth every 6 (six) hours as needed.     aspirin 81 MG tablet Take 81 mg by mouth daily.     b complex vitamins capsule Take 1 capsule by mouth daily.     calcium carbonate (OS-CAL - DOSED IN MG OF ELEMENTAL CALCIUM) 1250 (500 Ca) MG tablet      celecoxib (CELEBREX) 200 MG capsule TAKE 1 CAPSULE BY MOUTH TWICE A DAY 180 capsule 1   cephALEXin (KEFLEX) 500 MG capsule Take 1 capsule (500 mg total) by mouth 3 (three) times daily. 21 capsule 0   cholecalciferol (VITAMIN D) 1000 UNITS tablet Take 2,000 Units by mouth daily.     clobetasol (TEMOVATE) 0.05 % external solution as needed.     cyanocobalamin (VITAMIN B12) 1000 MCG tablet Take 1,000 mcg by mouth daily.      diclofenac Sodium (VOLTAREN) 1 % GEL APPLY 2 GRAMS TO AFFECTED AREA 4 TIMES A DAY 300 g 3   DULoxetine (CYMBALTA) 60 MG capsule TAKE 2 CAPSULES BY MOUTH DAILY 180 capsule 0   fluocinonide ointment (LIDEX) 0.05 % APPLY TWICE DAILY TO AFFECTED AREAS UNTIL IMPROVED THEN AS NEEDED FOR FLARES     furosemide (LASIX) 20 MG tablet Take 1 tablet (20 mg total) by mouth daily. 7 tablet 0   leflunomide (ARAVA) 20 MG tablet Take 20 mg by mouth daily.     [START ON 01/03/2024] lisdexamfetamine (VYVANSE) 50 MG capsule Take 1 capsule (50 mg total) by mouth daily. 30 capsule 0   Magnesium 250 MG CAPS Take by mouth. Take two by mouth daily     melatonin 5 MG TABS Take 5 mg by mouth at bedtime.     Multiple Vitamin (MULTIVITAMIN) tablet Take 1 tablet by mouth daily.     mupirocin ointment (BACTROBAN) 2 % Apply 1 Application topically 2 (two) times daily. 22 g 0   nystatin-triamcinolone ointment (MYCOLOG) Apply 1 application topically 2 (two) times daily. 30 g 1   Omega-3 Fatty Acids (FISH OIL) 1200 MG CAPS Take 1,200 mg by mouth daily.      oxyCODONE-acetaminophen (PERCOCET/ROXICET)  5-325 MG tablet Take 1 tablet by mouth every 6 (six) hours as needed for moderate pain (pain score 4-6). Do not refill less than 30 days from prior refill 120 tablet 0   Polyethyl Glycol-Propyl Glycol 0.4-0.3 % SOLN Place 1 drop into both eyes daily as needed (for dry eyes).     Probiotic Product (PROBIOTIC DAILY PO) Take 1 capsule by mouth daily.     solifenacin (VESICARE) 10 MG tablet TAKE 1 TABLET BY MOUTH EVERY DAY 90 tablet 1   triamcinolone cream (KENALOG) 0.1 % APPLY TO AFFECTED AREA TWICE A DAY UNTIL CLEAR THEN AS NEEDED     trospium (SANCTURA) 20 MG tablet Take 20 mg by mouth 2 (two) times daily.     Facility-Administered Medications Prior to Visit  Medication Dose Route Frequency Provider Last Rate Last Admin   mupirocin cream (BACTROBAN) 2 %   Topical BID         Review of Systems;  Patient denies headache, fevers,  malaise, unintentional weight loss, skin rash, eye pain, sinus congestion and sinus pain, sore throat, dysphagia,  hemoptysis , cough, dyspnea, wheezing, chest pain, palpitations, orthopnea, edema, abdominal pain, nausea, melena, diarrhea, constipation, flank pain, dysuria, hematuria, urinary  Frequency, nocturia, numbness, tingling, seizures,  Focal weakness, Loss of consciousness,  Tremor, insomnia, depression, anxiety, and suicidal ideation.      Objective:  BP 130/72   Pulse 94   Ht 5\' 5"  (1.651 m)   Wt 150 lb 3.2 oz (68.1 kg)   SpO2 97%   BMI 24.99 kg/m   BP Readings from Last 3 Encounters:  12/28/23 130/72  12/19/23 (!) 145/69  12/17/23 110/60    Wt Readings from Last 3 Encounters:  12/28/23 150 lb 3.2 oz (68.1 kg)  12/19/23 146 lb (66.2 kg)  12/17/23 148 lb (67.1 kg)    Physical Exam Vitals reviewed.  Constitutional:      General: She is not in acute distress.    Appearance: Normal appearance. She is normal weight. She is not ill-appearing, toxic-appearing or diaphoretic.  HENT:     Head: Normocephalic.  Eyes:     General: No scleral icterus.       Right eye: No discharge.        Left eye: No discharge.     Conjunctiva/sclera: Conjunctivae normal.  Cardiovascular:     Rate and Rhythm: Normal rate and regular rhythm.     Heart sounds: Normal heart sounds.  Pulmonary:     Effort: Pulmonary effort is normal. No respiratory distress.     Breath sounds: Normal breath sounds.  Musculoskeletal:        General: Normal range of motion.  Skin:    General: Skin is warm and dry.     Findings: Wound present.       Neurological:     General: No focal deficit present.     Mental Status: She is alert and oriented to person, place, and time. Mental status is at baseline.  Psychiatric:        Mood and Affect: Mood normal.        Behavior: Behavior normal.        Thought Content: Thought content normal.        Judgment: Judgment normal.   Lab Results  Component Value  Date   HGBA1C 6.1 01/31/2022    Lab Results  Component Value Date   CREATININE 0.75 11/13/2023   CREATININE 0.75 05/16/2023   CREATININE 0.83 11/09/2022  Lab Results  Component Value Date   WBC 5.0 12/17/2023   HGB 11.1 (L) 12/17/2023   HCT 33.7 (L) 12/17/2023   PLT 310.0 12/17/2023   GLUCOSE 96 11/13/2023   CHOL 365 (H) 11/09/2022   TRIG 81.0 11/09/2022   HDL 71.40 11/09/2022   LDLDIRECT 256.0 11/09/2022   LDLCALC 277 (H) 11/09/2022   ALT 14 11/13/2023   AST 18 11/13/2023   NA 141 11/13/2023   K 4.5 11/13/2023   CL 103 11/13/2023   CREATININE 0.75 11/13/2023   BUN 25 (H) 11/13/2023   CO2 28 11/13/2023   TSH 0.77 11/13/2023   INR 1.0 02/07/2023   HGBA1C 6.1 01/31/2022   MICROALBUR 1.7 01/31/2022    DG Lumbar Spine Complete W/Bend Result Date: 12/07/2023 CLINICAL DATA:  68 year old female with increasing back pain, prior fusion. Pain in both hips. EXAM: LUMBAR SPINE - COMPLETE WITH BENDING VIEWS COMPARISON:  Lumbar radiographs 11/21/2023 and earlier. FINDINGS: Seven views of the lumbar spine including lateral views in neutral, flexion, extension. Pronounced chronic dextroconvex lumbar scoliosis. Chronic L4-L5 and L5-S1 decompression and fusion with stable hardware. 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 As described on 11/21/2023 vacuum disc has regressed at T12-L1 and L1-L2 since May of 2024. Stable vertebral height and alignment from the recent radiographs. No abnormal motion in flexion or extension. No acute osseous abnormality identified. SI joints appear within normal limits. Negative visible lung bases, abdominal and pelvic visceral contours aside from Calcified aortic atherosclerosis. IMPRESSION: 1. Stable since 11/21/2023 radiographs. No acute osseous abnormality identified. No abnormal motion in flexion/extension. 2. Chronic severe lumbar spine degeneration superimposed on dextroconvex  lumbar scoliosis and previous fusion L4-L5 and L5-S1. 3. 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 Degenerative vacuum disc has regressed at T12-L1 and L1-L2 since 2024. Electronically Signed   By: Odessa Fleming M.D.   On: 12/07/2023 12:04   DG HIPS BILAT W OR W/O PELVIS MIN 5 VIEWS Result Date: 12/07/2023 CLINICAL DATA:  68 year old female with increasing back pain, prior fusion. Pain in both hips. EXAM: DG HIP (WITH OR WITHOUT PELVIS) 5+V BILAT COMPARISON:  Right hip series 07/19/2016. CT Abdomen and Pelvis 05/29/2023. FINDINGS: Partially visible chronic lumbar spine fusion, severe chronic adjacent segment disease with vacuum disc. Bone mineralization in the pelvis, at the hips is within normal limits for age. Femoral heads remain normally located. Pelvis appears intact. SI joints and symphysis are within normal limits for age. Asymmetric left hip joint space loss with mild subchondral sclerosis. Mild bilateral acetabular and femoral head degenerative spurring, fairly symmetric. Proximal femurs are intact. No acute osseous abnormality identified. Negative visible bowel gas pattern, pelvic visceral contours. IMPRESSION: 1. No acute osseous abnormality identified about the pelvis or hips. 2. Chronic asymmetric left hip joint space loss, otherwise symmetric bilateral acetabular and femoral head spurring. 3. 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 Electronically Signed   By: Odessa Fleming M.D.   On: 12/07/2023 12:01    Assessment & Plan:  .Skin ulcer, limited to breakdown of skin (HCC) Assessment & Plan: Complicated by probable venous insufficiency and contact dermatitis .  Dressing change  advised,  does not have cellulitis  so no antibiotics needed.  Telfa nonstick and tegaderm applied       I spent 34 minutes on the day of this face to face encounter reviewing patient's last two visits  with other providers ,   pain management ,  prior relevant surgical and non surgical procedures, recent  labs and imaging studies,  reviewing the assessment and plan with patient, and post visit ordering and reviewing of  diagnostics and therapeutics with patient  .   Follow-up: No follow-ups on file.   Sherlene Shams, MD

## 2023-12-30 NOTE — Assessment & Plan Note (Signed)
 Complicated by probable venous insufficiency and contact dermatitis .  Dressing change  advised,  does not have cellulitis  so no antibiotics needed.  Telfa nonstick and tegaderm applied

## 2024-01-02 ENCOUNTER — Telehealth: Payer: Self-pay | Admitting: Psychiatry

## 2024-01-02 NOTE — Telephone Encounter (Signed)
 LF 2/28, due 3/28

## 2024-01-02 NOTE — Telephone Encounter (Signed)
 Pt called and is out of town and needs her vyvanse 50 mg to be cancelled here and resub[facility] to the [facility] [id number] [place] phone number [phone]

## 2024-01-04 ENCOUNTER — Other Ambulatory Visit: Payer: Self-pay

## 2024-01-04 ENCOUNTER — Ambulatory Visit: Admitting: Internal Medicine

## 2024-01-04 DIAGNOSIS — F900 Attention-deficit hyperactivity disorder, predominantly inattentive type: Secondary | ICD-10-CM

## 2024-01-04 MED ORDER — LISDEXAMFETAMINE DIMESYLATE 50 MG PO CAPS: 50.0000 mg | ORAL_CAPSULE | Freq: Every day | ORAL | 0 refills | Status: DC

## 2024-01-04 NOTE — Telephone Encounter (Signed)
 Pended to [place], need to cancel at [facility]

## 2024-01-04 NOTE — Telephone Encounter (Signed)
 Canceled at [facility]

## 2024-01-07 DIAGNOSIS — Z91040 Latex allergy status: Secondary | ICD-10-CM | POA: Insufficient documentation

## 2024-01-07 DIAGNOSIS — Z9189 Other specified personal risk factors, not elsewhere classified: Secondary | ICD-10-CM | POA: Insufficient documentation

## 2024-01-07 NOTE — Progress Notes (Unsigned)
 PROVIDER NOTE: Interpretation of information contained herein should be left to medically-trained personnel. Specific patient instructions are provided elsewhere under "Patient Instructions" section of medical record. This document was created in part using STT-dictation technology, any transcriptional errors that may result from this process are unintentional.  Patient: Madeline Young Type: Established DOB: 25-Apr-1956 MRN: [id number] PCP: Sherlene Shams, MD  Service: Procedure DOS: 01/08/2024 Setting: Ambulatory Location: Ambulatory outpatient facility Delivery: Face-to-face Provider: Oswaldo Done, MD Specialty: Interventional Pain Management Specialty designation: 09 Location: Outpatient facility Ref. Prov.: Sherlene Shams, MD       Interventional Therapy   Type: Lumbar epidural steroid injection (LESI) (interlaminar) #1    Laterality: Right   Level:  T12-L1 Level.  Imaging: Fluoroscopic guidance Spinal ([id number]) Anesthesia: Local anesthesia (1-2% Lidocaine) Anxiolysis: IV Versed 2.0 mg Sedation: Moderate Sedation                       DOS: 01/08/2024  Performed by: Oswaldo Done, MD  Purpose: Diagnostic/Therapeutic Indications: Lumbar radicular pain of intraspinal etiology of more than 4 weeks that has failed to respond to conservative therapy and is severe enough to impact quality of life or function. 1. 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 Low back pain of over 3 months duration   3. Low back pain radiating to legs (Bilateral)   4. Lower extremity edema   5. Lumbar DDD w/ LBP & LEP   6. Lumbosacral radiculopathy at L5 (Right) by EMG   7. Lumbar stenosis with neurogenic claudication   8. Lumbosacral radiculopathy at S1 (Right) by EMG   9. Spondylolisthesis of lumbar region   10. Abnormal MRI, lumbar spine (04/19/2023) ([facility])   11. Latex precautions, history of latex allergy   12. At high risk for allergic reaction to latex   13.  Chronic low back pain (1ry area of Pain) (Bilateral) w/o sciatica   14. Chronic hip pain (2ry area of Pain) (Bilateral)   15. Chronic lumbar radiculopathy (S1>L5) (Right) by EMG   16. Fusion of lumbar spine   17. Pain from implanted hardware, sequela   18. Abnormal CT myelogram of lumbar spine (02/15/2023)   19. Prophylactic antibiotic    NAS-11 Pain score:   Pre-procedure: 5 /10   Post-procedure: 0-No pain/10      Position / Prep / Materials:  Position: Prone w/ head of the table raised (slight reverse trendelenburg) to facilitate breathing.  Prep solution: ChloraPrep (2% chlorhexidine gluconate and 70% isopropyl alcohol) Prep Area: Entire Posterior Lumbar Region from lower scapular tip down to mid buttocks area and from flank to flank. Materials:  Tray: Epidural tray Needle(s):  Type: Epidural needle (Tuohy) Gauge (G):  17 Length: Regular (3.5-in) Qty: 1   H&P (Pre-op Assessment):  [name] is a 68 y.o. (year old), female patient, seen today for interventional treatment. She  has a past surgical history that includes Foot surgery (2006); Colonoscopy w/ polypectomy; Upper gastrointestinal endoscopy (2005); Tonsillectomy; Biopsy thyroid (2014); Eye surgery; Back surgery (02/17/2016); Colonoscopy; and Polypectomy. [name] has a current medication list which includes the following prescription(s): acetaminophen, aspirin, b complex vitamins, calcium carbonate, celecoxib, cephalexin, cholecalciferol, clobetasol, cyanocobalamin, diclofenac sodium, duloxetine, fluocinonide ointment, furosemide, leflunomide, lisdexamfetamine, magnesium, melatonin, multivitamin, mupirocin ointment, nystatin-triamcinolone ointment, fish oil, oxycodone-acetaminophen, polyethyl glycol-propyl glycol, probiotic product, solifenacin, triamcinolone cream, and trospium, and the following Facility-Administered Medications: fentanyl and mupirocin cream. Her primarily concern today is the Back Pain  Initial Vital  Signs:  Pulse/HCG Rate: 85ECG Heart Rate: 72 (nsr) Temp: (!) 97.2 F (36.2 C) Resp: 16 BP: (!) 118/57 SpO2: 97 %  BMI: Estimated body mass index is 24.13 kg/m as calculated from the following:   Height as of this encounter: 5\' 5"  (1.651 m).   Weight as of this encounter: 145 lb (65.8 kg).  Risk Assessment: Allergies: Reviewed. She is allergic to cosentyx [secukinumab], sertraline hcl, adalimumab, avelox [moxifloxacin hcl in nacl], nitrofurantoin, pregabalin, rosuvastatin, latex, macrobid [nitrofurantoin monohyd macro], and statins.  Allergy Precautions: None required Coagulopathies: Reviewed. None identified.  Blood-thinner therapy: None at this time Active Infection(s): Reviewed. None identified. [name] is afebrile  Site Confirmation: [name] was asked to confirm the procedure and laterality before marking the site Procedure checklist: Completed Consent: Before the procedure and under the influence of no sedative(s), amnesic(s), or anxiolytics, the patient was informed of the treatment options, risks and possible complications. To fulfill our ethical and legal obligations, as recommended by the American Medical Association's Code of Ethics, I have informed the patient of my clinical impression; the nature and purpose of the treatment or procedure; the risks, benefits, and possible complications of the intervention; the alternatives, including doing nothing; the risk(s) and benefit(s) of the alternative treatment(s) or procedure(s); and the risk(s) and benefit(s) of doing nothing. The patient was provided information about the general risks and possible complications associated with the procedure. These may include, but are not limited to: failure to achieve desired goals, infection, bleeding, organ or nerve damage, allergic reactions, paralysis, and death. In addition, the patient was informed of those risks and complications associated to Spine-related procedures, such as failure to  decrease pain; infection (i.e.: Meningitis, epidural or intraspinal abscess); bleeding (i.e.: epidural hematoma, subarachnoid hemorrhage, or any other type of intraspinal or peri-dural bleeding); organ or nerve damage (i.e.: Any type of peripheral nerve, nerve root, or spinal cord injury) with subsequent damage to sensory, motor, and/or autonomic systems, resulting in permanent pain, numbness, and/or weakness of one or several areas of the body; allergic reactions; (i.e.: anaphylactic reaction); and/or death. Furthermore, the patient was informed of those risks and complications associated with the medications. These include, but are not limited to: allergic reactions (i.e.: anaphylactic or anaphylactoid reaction(s)); adrenal axis suppression; blood sugar elevation that in diabetics may result in ketoacidosis or comma; water retention that in patients with history of congestive heart failure may result in shortness of breath, pulmonary edema, and decompensation with resultant heart failure; weight gain; swelling or edema; medication-induced neural toxicity; particulate matter embolism and blood vessel occlusion with resultant organ, and/or nervous system infarction; and/or aseptic necrosis of one or more joints. Finally, the patient was informed that Medicine is not an exact science; therefore, there is also the possibility of unforeseen or unpredictable risks and/or possible complications that may result in a catastrophic outcome. The patient indicated having understood very clearly. We have given the patient no guarantees and we have made no promises. Enough time was given to the patient to ask questions, all of which were answered to the patient's satisfaction. Ms. Yetman has indicated that she wanted to continue with the procedure. Attestation: I, the ordering provider, attest that I have discussed with the patient the benefits, risks, side-effects, alternatives, likelihood of achieving goals, and potential  problems during recovery for the procedure that I have provided informed consent. Date  Time: 01/08/2024  9:26 AM  Pre-Procedure Preparation:  Monitoring: As per clinic protocol. Respiration, ETCO2, SpO2, BP, heart rate and rhythm  monitor placed and checked for adequate function Safety Precautions: Patient was assessed for positional comfort and pressure points before starting the procedure. Time-out: I initiated and conducted the "Time-out" before starting the procedure, as per protocol. The patient was asked to participate by confirming the accuracy of the "Time Out" information. Verification of the correct person, site, and procedure were performed and confirmed by me, the nursing staff, and the patient. "Time-out" conducted as per Joint Commission's Universal Protocol (UP.01.01.01). Time: 1029 Start Time: 1029 hrs.  Description/Narrative of Procedure:          Target: Epidural space via interlaminar opening, initially targeting the lower laminar border of the superior vertebral body. Region: Lumbar Approach: Percutaneous paravertebral  Rationale (medical necessity): procedure needed and proper for the diagnosis and/or treatment of the patient's medical symptoms and needs. Procedural Technique Safety Precautions: Aspiration looking for blood return was conducted prior to all injections. At no point did we inject any substances, as a needle was being advanced. No attempts were made at seeking any paresthesias. Safe injection practices and needle disposal techniques used. Medications properly checked for expiration dates. SDV (single dose vial) medications used. Description of the Procedure: Protocol guidelines were followed. The procedure needle was introduced through the skin, ipsilateral to the reported pain, and advanced to the target area. Bone was contacted and the needle walked caudad, until the lamina was cleared. The epidural space was identified using "loss-of-resistance technique" with  2-3 ml of PF-NaCl (0.9% NSS), in a 5cc LOR glass syringe.  Vitals:   01/08/24 1034 01/08/24 1040 01/08/24 1043 01/08/24 1055  BP: (!) 171/89 (!) 169/87 (!) 178/92 (!) 165/84  Pulse:    80  Resp: 18 18 18 16   Temp:      SpO2: 100% 100% 100% 100%  Weight:      Height:        Start Time: 1029 hrs. End Time: 1042 hrs.  Imaging Guidance (Spinal):          Type of Imaging Technique: Fluoroscopy Guidance (Spinal) Indication(s): Fluoroscopy guidance for needle placement to enhance accuracy in procedures requiring precise needle localization for targeted delivery of medication in or near specific anatomical locations not easily accessible without such real-time imaging assistance. Exposure Time: Please see nurses notes. Contrast: Before injecting any contrast, we confirmed that the patient did not have an allergy to iodine, shellfish, or radiological contrast. Once satisfactory needle placement was completed at the desired level, radiological contrast was injected. Contrast injected under live fluoroscopy. No contrast complications. See chart for type and volume of contrast used. Fluoroscopic Guidance: I was personally present during the use of fluoroscopy. "Tunnel Vision Technique" used to obtain the best possible view of the target area. Parallax error corrected before commencing the procedure. "Direction-depth-direction" technique used to introduce the needle under continuous pulsed fluoroscopy. Once target was reached, antero-posterior, oblique, and lateral fluoroscopic projection used confirm needle placement in all planes. Images permanently stored in EMR. Interpretation: I personally interpreted the imaging intraoperatively. Adequate needle placement confirmed in multiple planes. Appropriate spread of contrast into desired area was observed. No evidence of afferent or efferent intravascular uptake. No intrathecal or subarachnoid spread observed. Permanent images saved into the patient's  record.  Antibiotic Prophylaxis:   Anti-infectives (From admission, onward)    Start     Dose/Rate Route Frequency Ordered Stop   01/08/24 1000  ceFAZolin (ANCEF) IVPB 2g/100 mL premix        2 g 200 mL/hr over 30 Minutes Intravenous  Once 01/08/24 0956 01/08/24 1032      Indication(s): None identified  Post-operative Assessment:  Post-procedure Vital Signs:  Pulse/HCG Rate: 8075 (nsr) Temp: (!) 97.2 F (36.2 C) Resp: 16 BP: (!) 165/84 SpO2: 100 %  EBL: None  Complications: No immediate post-treatment complications observed by team, or reported by patient.  Note: The patient tolerated the entire procedure well. A repeat set of vitals were taken after the procedure and the patient was kept under observation following institutional policy, for this type of procedure. Post-procedural neurological assessment was performed, showing return to baseline, prior to discharge. The patient was provided with post-procedure discharge instructions, including a section on how to identify potential problems. Should any problems arise concerning this procedure, the patient was given instructions to immediately contact us, at any time, without hesitation. In any case, we plan to contact the patient by telephone for a follow-up status report regarding this interventional procedure.  Comments:  No additional relevant information.  Plan of Care (POC)  Orders:  Orders Placed This Encounter  Procedures   Lumbar Epidural Injection    Scheduling Instructions:     Procedure: Interlaminar LESI T12-L1     Laterality: Right     Sedation: Patient's choice     Timeframe: Today    Where will this procedure be performed?:   [facility]   [facility] C-ARM 1-60 MIN NO REPORT    Intraoperative interpretation by procedural physician at [facility].    Standing Status:   Standing    Number of Occurrences:   1    Reason for exam::   Assistance in needle guidance and placement for  procedures requiring needle placement in or near specific anatomical locations not easily accessible without such assistance.   Informed Consent Details: Physician/Practitioner Attestation; Transcribe to consent form and obtain patient signature    Note: Always confirm laterality of pain with Ms. Leavelle, before procedure. Transcribe to consent form and obtain patient signature.    Physician/Practitioner attestation of informed consent for procedure/surgical case:   I, the physician/practitioner, attest that I have discussed with the patient the benefits, risks, side effects, alternatives, likelihood of achieving goals and potential problems during recovery for the procedure that I have provided informed consent.    Procedure:   Lumbar epidural steroid injection under fluoroscopic guidance    Physician/Practitioner performing the procedure:   Piero Mustard A. Laban Emperor, MD    Indication/Reason:   Low back and/or lower extremity pain secondary to lumbar radiculitis   Provide equipment / supplies at bedside    Procedural tray: Epidural Tray (Disposable  single use) Skin infiltration needle: Regular 1.5-in, 25-G, (x1) Block needle size: Regular standard Catheter: No catheter required    Standing Status:   Standing    Number of Occurrences:   1    Specify:   Epidural Tray   Saline lock IV    Have LR [phone] mL available and administer at 125 mL/hr if patient becomes hypotensive.    Standing Status:   Standing    Number of Occurrences:   1   Latex precautions    Activate Latex-Free Protocol.    Standing Status:   Standing    Number of Occurrences:   1   Chronic Opioid Analgesic:  No chronic opioid analgesics therapy prescribed by our practice. oxycodone/APAP 5/325, 1 tab p.o. 4 times daily (120/month) (# 120) (last filled on 11/06/2023) MME/day: 30 mg/day   Medications ordered for procedure: Meds ordered this encounter  Medications  iohexol (OMNIPAQUE) 180 MG/ML injection 10 mL    Must be  Myelogram-compatible. If not available, you may substitute with a water-soluble, non-ionic, hypoallergenic, myelogram-compatible radiological contrast medium.   lidocaine (XYLOCAINE) 2 % (with pres) injection 400 mg   pentafluoroprop-tetrafluoroeth (GEBAUERS) aerosol   midazolam (VERSED) 5 MG/5ML injection 0.5-2 mg    Make sure Flumazenil is available in the pyxis when using this medication. If oversedation occurs, administer 0.2 mg IV over 15 sec. If after 45 sec no response, administer 0.2 mg again over 1 min; may repeat at 1 min intervals; not to exceed 4 doses (1 mg)   fentaNYL (SUBLIMAZE) injection 25-50 mcg    Make sure Narcan is available in the pyxis when using this medication. In the event of respiratory depression (RR< 8/min): Titrate NARCAN (naloxone) in increments of 0.1 to 0.2 mg IV at 2-3 minute intervals, until desired degree of reversal.   sodium chloride flush (NS) 0.9 % injection 2 mL   ropivacaine (PF) 2 mg/mL (0.2%) (NAROPIN) injection 2 mL   triamcinolone acetonide (KENALOG-40) injection 40 mg   ceFAZolin (ANCEF) IVPB 2g/100 mL premix    Antibiotic Indication::   Surgical Prophylaxis    Other Indication::   Procedure Prophylaxis   Medications administered: We administered iohexol, lidocaine, pentafluoroprop-tetrafluoroeth, midazolam, sodium chloride flush, ropivacaine (PF) 2 mg/mL (0.2%), triamcinolone acetonide, and ceFAZolin.  See the medical record for exact dosing, route, and time of administration.  Follow-up plan:   Return in about 2 weeks (around 01/22/2024) for (Face2F), (PPE).       Interventional Therapies  Risk Factors  Considerations  Medical Comorbidities:    Poor candidate for RFA    Planned  Pending:      Under consideration:   Diagnostic bilateral lumbar hardware/Facet block #1  Diagnostic caudal ESI + diagnostic epidurogram #1  Diagnostic bilateral IA hip & TB inj. #1  Possible spinal cord stimulator trial  EMG/PNCV of both lower  extremities. (Only right side tested.)   Completed:   Diagnostic right T12-L1 LESI x1 (01/08/2024)    Therapeutic  Palliative (PRN) options:   None established   Completed by other providers:   Therapeutic left L2-3 TFESI x1 (06/05/2023) by Caffie Pinto, MD [facility])  Therapeutic right trochanteric bursa inj. x1 (09/14/2022) by Benjiman Core, MD [facility])  Therapeutic bilateral IA hip inj. 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     Recent Visits Date Type Provider Dept  12/19/23 Office Visit Delano Metz, MD [facility]  11/28/23 Office Visit Delano Metz, MD [facility]  Showing recent visits within past 90 days and meeting all other requirements Today's Visits Date Type Provider Dept  01/08/24 Procedure visit Delano Metz, MD [facility]  Showing today's visits and meeting all other requirements Future Appointments Date Type Provider Dept  01/22/24 Appointment Delano Metz, MD [facility]  Showing future appointments within next 90 days and meeting all other requirements  Disposition: Discharge home  Discharge (Date  Time): 01/08/2024; 1107  hrs.   Primary Care Physician: Sherlene Shams, MD Location: [facility] Note by: Oswaldo Done, MD (TTS technology used. I apologize for any typographical errors that were not detected and corrected.) Date:  01/08/2024; Time: 11:58 AM  Disclaimer:  Medicine is not an Visual merchandiser. The only guarantee in medicine is that nothing is guaranteed. It is important to note that the decision to proceed with this intervention was based on the information collected from the patient. The Data and conclusions were drawn from the patient's questionnaire, the interview, and the physical examination. Because the information was provided in large part by the patient, it cannot be guaranteed that it has not been purposely or unconsciously manipulated. Every effort has been made to obtain as much relevant data as possible for this evaluation. It is important to note that the conclusions that lead to this procedure are derived in large part from the available data. Always take into account that the treatment will also be dependent on availability of resources and existing treatment guidelines, considered by other Pain Management Practitioners as being common knowledge and practice, at the time of the intervention. For Medico-Legal purposes, it is also important to point out that variation in procedural techniques and pharmacological choices are the acceptable norm. The indications, contraindications, technique, and results of the above procedure should only be interpreted and judged by a Board-Certified Interventional Pain Specialist with extensive familiarity and expertise in the same exact procedure and technique.

## 2024-01-08 ENCOUNTER — Ambulatory Visit
Admission: RE | Admit: 2024-01-08 | Discharge: 2024-01-08 | Disposition: A | Discharge status: Home / Self Care | Source: Ambulatory Visit | Attending: Pain Medicine | Admitting: Pain Medicine

## 2024-01-08 ENCOUNTER — Encounter: Payer: Self-pay | Admitting: Pain Medicine

## 2024-01-08 ENCOUNTER — Ambulatory Visit: Attending: Pain Medicine | Admitting: Pain Medicine

## 2024-01-08 VITALS — BP 165/84 | HR 80 | Temp 97.2°F | Resp 16 | Ht 65.0 in | Wt 145.0 lb

## 2024-01-08 DIAGNOSIS — M5417 Radiculopathy, lumbosacral region: Secondary | ICD-10-CM

## 2024-01-08 DIAGNOSIS — R937 Abnormal findings on diagnostic imaging of other parts of musculoskeletal system: Secondary | ICD-10-CM | POA: Diagnosis present

## 2024-01-08 DIAGNOSIS — M5416 Radiculopathy, lumbar region: Secondary | ICD-10-CM

## 2024-01-08 DIAGNOSIS — Z91040 Latex allergy status: Secondary | ICD-10-CM

## 2024-01-08 DIAGNOSIS — G8929 Other chronic pain: Secondary | ICD-10-CM

## 2024-01-08 DIAGNOSIS — M79605 Pain in left leg: Secondary | ICD-10-CM | POA: Diagnosis present

## 2024-01-08 DIAGNOSIS — Z792 Long term (current) use of antibiotics: Secondary | ICD-10-CM | POA: Diagnosis present

## 2024-01-08 DIAGNOSIS — M25551 Pain in right hip: Secondary | ICD-10-CM | POA: Diagnosis present

## 2024-01-08 DIAGNOSIS — M545 Low back pain, unspecified: Secondary | ICD-10-CM | POA: Diagnosis present

## 2024-01-08 DIAGNOSIS — R600 Localized edema: Secondary | ICD-10-CM | POA: Diagnosis present

## 2024-01-08 DIAGNOSIS — M4326 Fusion of spine, lumbar region: Secondary | ICD-10-CM

## 2024-01-08 DIAGNOSIS — M51362 Other intervertebral disc degeneration, lumbar region with discogenic back pain and lower extremity pain: Secondary | ICD-10-CM | POA: Diagnosis present

## 2024-01-08 DIAGNOSIS — Z9189 Other specified personal risk factors, not elsewhere classified: Secondary | ICD-10-CM | POA: Diagnosis present

## 2024-01-08 DIAGNOSIS — M961 Postlaminectomy syndrome, not elsewhere classified: Secondary | ICD-10-CM | POA: Diagnosis not present

## 2024-01-08 DIAGNOSIS — M4316 Spondylolisthesis, lumbar region: Secondary | ICD-10-CM | POA: Diagnosis present

## 2024-01-08 DIAGNOSIS — M48062 Spinal stenosis, lumbar region with neurogenic claudication: Secondary | ICD-10-CM | POA: Diagnosis present

## 2024-01-08 DIAGNOSIS — M79604 Pain in right leg: Secondary | ICD-10-CM | POA: Diagnosis present

## 2024-01-08 DIAGNOSIS — M25552 Pain in left hip: Secondary | ICD-10-CM | POA: Diagnosis present

## 2024-01-08 DIAGNOSIS — T85848S Pain due to other internal prosthetic devices, implants and grafts, sequela: Secondary | ICD-10-CM

## 2024-01-08 MED ORDER — FENTANYL CITRATE (PF) 100 MCG/2ML IJ SOLN: 25.0000 ug | INTRAMUSCULAR | Status: DC | PRN

## 2024-01-08 MED ORDER — IOHEXOL 180 MG/ML  SOLN
10.0000 mL | Freq: Once | INTRAMUSCULAR | Status: AC
  Administered 2024-01-08: 10 mL via EPIDURAL

## 2024-01-08 MED ORDER — CEFAZOLIN SODIUM 1 G IJ SOLR
INTRAMUSCULAR | Status: AC
  Filled 2024-01-08: qty 20

## 2024-01-08 MED ORDER — MIDAZOLAM HCL 5 MG/5ML IJ SOLN
INTRAMUSCULAR | Status: AC
  Filled 2024-01-08: qty 5

## 2024-01-08 MED ORDER — CEFAZOLIN SODIUM-DEXTROSE 2-4 GM/100ML-% IV SOLN
2.0000 g | Freq: Once | INTRAVENOUS | Status: AC
  Administered 2024-01-08: 2 g via INTRAVENOUS
  Filled 2024-01-08: qty 100

## 2024-01-08 MED ORDER — SODIUM CHLORIDE 0.9% FLUSH
2.0000 mL | Freq: Once | INTRAVENOUS | Status: AC
  Administered 2024-01-08: 2 mL

## 2024-01-08 MED ORDER — SODIUM CHLORIDE (PF) 0.9 % IJ SOLN
INTRAMUSCULAR | Status: AC
  Filled 2024-01-08: qty 10

## 2024-01-08 MED ORDER — LIDOCAINE HCL (PF) 2 % IJ SOLN
INTRAMUSCULAR | Status: AC
  Filled 2024-01-08: qty 10

## 2024-01-08 MED ORDER — ROPIVACAINE HCL 2 MG/ML IJ SOLN
INTRAMUSCULAR | Status: AC
  Filled 2024-01-08: qty 20

## 2024-01-08 MED ORDER — ROPIVACAINE HCL 2 MG/ML IJ SOLN
2.0000 mL | Freq: Once | INTRAMUSCULAR | Status: AC
Start: 2024-01-08 — End: 2024-01-08
  Administered 2024-01-08: 2 mL via EPIDURAL

## 2024-01-08 MED ORDER — IOHEXOL 180 MG/ML  SOLN
INTRAMUSCULAR | Status: AC
  Filled 2024-01-08: qty 10

## 2024-01-08 MED ORDER — TRIAMCINOLONE ACETONIDE 40 MG/ML IJ SUSP
40.0000 mg | Freq: Once | INTRAMUSCULAR | Status: AC
  Administered 2024-01-08: 40 mg

## 2024-01-08 MED ORDER — PENTAFLUOROPROP-TETRAFLUOROETH EX AERO
INHALATION_SPRAY | Freq: Once | CUTANEOUS | Status: AC
  Administered 2024-01-08: 30 via TOPICAL

## 2024-01-08 MED ORDER — MIDAZOLAM HCL 5 MG/5ML IJ SOLN
0.5000 mg | Freq: Once | INTRAMUSCULAR | Status: AC
Start: 2024-01-08 — End: 2024-01-08
  Administered 2024-01-08: 2 mg via INTRAVENOUS

## 2024-01-08 MED ORDER — FENTANYL CITRATE (PF) 100 MCG/2ML IJ SOLN
INTRAMUSCULAR | Status: AC
  Filled 2024-01-08: qty 2

## 2024-01-08 MED ORDER — LIDOCAINE HCL 2 % IJ SOLN
20.0000 mL | Freq: Once | INTRAMUSCULAR | Status: AC
  Administered 2024-01-08: 100 mg

## 2024-01-08 MED ORDER — TRIAMCINOLONE ACETONIDE 40 MG/ML IJ SUSP
INTRAMUSCULAR | Status: AC
  Filled 2024-01-08: qty 1

## 2024-01-08 NOTE — Progress Notes (Signed)
 Safety precautions to be maintained throughout the outpatient stay will include: orient to surroundings, keep bed in low position, maintain call bell within reach at all times, provide assistance with transfer out of bed and ambulation.

## 2024-01-08 NOTE — Patient Instructions (Signed)

## 2024-01-09 ENCOUNTER — Telehealth: Payer: Self-pay

## 2024-01-09 ENCOUNTER — Encounter: Payer: Self-pay | Admitting: Psychiatry

## 2024-01-09 ENCOUNTER — Ambulatory Visit (INDEPENDENT_AMBULATORY_CARE_PROVIDER_SITE_OTHER): Payer: Medicare HMO | Admitting: Psychiatry

## 2024-01-09 DIAGNOSIS — F3341 Major depressive disorder, recurrent, in partial remission: Secondary | ICD-10-CM | POA: Diagnosis not present

## 2024-01-09 DIAGNOSIS — F5105 Insomnia due to other mental disorder: Secondary | ICD-10-CM

## 2024-01-09 DIAGNOSIS — F900 Attention-deficit hyperactivity disorder, predominantly inattentive type: Secondary | ICD-10-CM | POA: Diagnosis not present

## 2024-01-09 DIAGNOSIS — M5489 Other dorsalgia: Secondary | ICD-10-CM | POA: Diagnosis not present

## 2024-01-09 DIAGNOSIS — G8929 Other chronic pain: Secondary | ICD-10-CM

## 2024-01-09 MED ORDER — LISDEXAMFETAMINE DIMESYLATE 70 MG PO CAPS: 70.0000 mg | ORAL_CAPSULE | Freq: Every day | ORAL | 0 refills | Status: DC

## 2024-01-09 MED ORDER — LISDEXAMFETAMINE DIMESYLATE 50 MG PO CAPS: 50.0000 mg | ORAL_CAPSULE | Freq: Every day | ORAL | 0 refills | Status: DC

## 2024-01-09 MED ORDER — DULOXETINE HCL 60 MG PO CPEP
120.0000 mg | ORAL_CAPSULE | Freq: Every day | ORAL | 3 refills | Status: DC
Start: 2024-01-09 — End: 2024-02-04

## 2024-01-09 MED ORDER — ALPRAZOLAM 0.25 MG PO TABS: 0.2500 mg | ORAL_TABLET | Freq: Every evening | ORAL | 2 refills | Status: AC | PRN

## 2024-01-09 NOTE — Progress Notes (Signed)
 Madeline Young [id number] Jul 27, 1956 68 y.o.   Subjective:   Patient ID:  Madeline Young is a 68 y.o. (DOB 1956-04-14) female.  Chief Complaint:  Chief Complaint  Patient presents with   Follow-up   Depression   Anxiety   ADD   Sleeping Problem    [name] presents to the office today for follow-up of ADD and anxiety and depression and sleep.  seen August 2020.  No meds were changed.  On Vyvanse 50, fluoxetine 20, and Ambien 5.  seen November 30, 2019.  The following was noted: "Down".  Doesn't feel Vyvanse helping as much.  More scattered and doesn't finish things.  No SE with it. Sleep pattern is worse as Covid progressed.  Had reduced Zolpidem to 5 mg but then increased to 7.5 mg HS.  If takes Benadryl but can sleep but then has crying spells after it and hangover.  In the AM feels more negative and tearful.  AM is hard bc stiff in the morning. Plan:Continue Vyvanse 50 mg AM Increase fluoxetine to 30 mg daily.  She doesn't want to go to 40 bc fear of serotonin syndrome.  Thinks she had some of those sx in the past Continue zolpidem 5 mg daily.  01/29/20 appt, reported:  Better with increased fluoxetine re: mood. No SE with meds. Still ADD problems not as well controlled as she'd like. Still problems with sleep and doesn't think ambien workiing as well as it should.  Tried trazodone with Ambien but had hangover and difficulty waking up and not a deep good sleep.  Never tried trazodone alone.  Increased Ambien on her own to 10 mg and ran out early.  Reduced to 5 mg and added Benadryl.  Erratic sleep since Covid.  Go to bed same time 10.  Avoids alcohol 2 hours before. Primary problem is going to sleep.  History of 10 mg Ambien for years.  Says 7.5 mg Ambien will work. Caffeine none after noon. Plan: Increase Vyvanse 60 mg AM continue fluoxetine to 30 mg daily.  She doesn't want to go to 40 bc fear of serotonin syndrome.  Thinks she had some of those sx in the  past.  It helped to increase Increase zolpidem 5-10 mg daily.  08/02/2020 appointment with the following noted: Went back down to 20 mg fluoxetine bc of more HA and fear of serotonin syndrome bc she feels she had it at some point in the past.  Taking 20 mg for mos. Still problems with sleep.  Down to 2.5 mg Ambien bc when takes more feels out of it the next day.   Thinks taking Ambien too long. Too much awakening. No marked benefit with increased Vyvanse to 60 mg and feels more irritable and edgy. However still distractible and inefficient and hard to finish things. Forgetful and loses things. Plan: Reduce Vyvanse to 50 mg AM continue fluoxetine to 20 mg daily DT her fear of serotonin syndrome.  Thinks she had some of those sx in the past.  May have reduced benefit DT lower dose. Trial Belsomra 15-20 mg or Dayvigo for sleep instead of Ambien bc hangover with 5 mg daily.  11/01/2020 appointment with the following noted: Phone call 08/16/2020:Rtc to patient and she did try the Belsomra and reports having the "hang over and drowsy" effect. Same as the trazodone.  She is asking to try Lunesta instead.  10/07/2020 she called back again stating she is wanted to go back to Ambien 5 mg.  Ambien seems to work again and getting 8 hours now. Re: reduction vyvanse to 50 mg.  Now feels that it's now working well.  Very distractible and not finishing things.  Not affordable to take the biologicals any more and had to stop but pain is not worse. Plan increase Vyvanse to 60 mg daily. For sleep return to Ambien 5 mg nightly because of failures of alternatives.  03/08/2021 appointment with the following noted: Doing good overall.   Taking 7.5 mg Ambien.  Need to sleep and satisfied with it now. Overall in a good place emotionally and mentally.  Satisfied with meds. No SE now. Tolerated Vyvanse 60 fine and pleased with it.   Prednisone back a couple of weeks ago.  Pain can awaken her also. Pleased to sell  property. Plan: no med changes  09/07/2021 appointment with the following noted: Worst couple of mos.   July flair up of arthritis, the worst ever related to stress, the heat and over exertion. Took 5 mos to see new rheumatologist and sees them tomorrow. Got more down with pain. On prednisone it helped.  On 20 mg prednisone had irritability and ADD worse and insomnia.  Forgetful, distractible to marked degree.  Trouble making decisions.  Rewriting lists of things to do wasting time.  Better cognition with less prednisone.  Would rather hurt than be like this. Almost nonfunctional at this time.  Always doing things.   Having BA remodeled.  Sleep 6-7 hours. Plan: Yes trial Namenda off label. continue fluoxetine to 20 mg daily DT her fear of serotonin syndrome.  Thinks she had some of those sx in the past.  May have reduced benefit DT lower dose. Per her request to increase Vyvanse to 70 mg every morning for better benefit for cognition.  11/10/2021 appointment with the following noted: Not on Namenda at this time. New rheum dx CPPD and wondering if she had psoriatic arthritis and wanted to try new drugs. Lives in historic house with a lot of stairs.  At [facility] with more problems with joints.  Worry over new doctor and new dx with FU MRI 2 weeks ago.  Worst flare up ever and took her down mentally.  Pain interfered with sleep.  Missed 2 mos of hair appts.  Couldn't function DT pain.Need to restart prednisone and get injections from Dr. Clydene Pugh yesterday.  Prednisone helped. Thought Namenda helped some but caused dizziness.  Stopped it. Had gotten down to 1/4 Ambien and melatonin but prednisone increased insomnia. Back on 5 mg Ambien. Inflammatory flareup if overworks. Got mild Covid and H Covid with no sx. Health stress and health care issues. Still all over the place. Plan no med chages  05/15/2022 appointment noted: Taking Vyvanse 60 bc can't get 70's Overall doing well lately.  [place]. GD [place] and she went to help them.  Born at 34 weeks and GI issues.   D from [place] came to visit for a couple of weeks.   RA flare up.  Ongoing problem. Mood is ok.    11/16/22 appt noted: Ongoing med px. No med changes Vyvanse 70 AM, fluoxetine 20 mg daily Renovation triggering bursitis affecting sleep DT pain and psoriasis flares.  Going to [facility] Arthritis is severe and problematic.   Stress medical bills. Not markedly depressed but down over medical problems with severe arthritis. Some memory issues.  Not severe.   Still starts things she doesn't  finish. No SE. D last baby born premature.   Plan no changes  05/17/23 appt noted:  also disc with D Anna Meds as above No SE A lot has happened.  Infusions for psoriatic arthritis.  Noticed more joint and muscle pain but tried 3 and got worse.  CBP got worse.  Some sciatica.    Hx fusion 2017.   Got so bad had to use walker. Dx scoliosis and severe disc bulging.  Was told not a surgical candidate by prior surgeon.  2nd opinion in [facility] said same thing.  In constant pain and cry all the time.   Asks about switch to modafinil. D noticed before pain meds, still spacey, disorganized, irritable.  Wonders vyvanse is causing some of these SE.   Plan: Ok switch to modafinil 100 mg AM for 1 week then 200 mg and DC Vyvanse. Switch duloxetine to 60 mg daily.  For mood and potentially for pain.  Able to stop Ambien.  Using melatonin and CBD gummy  06/01/23 TC about fears of taking duloxetine.  She will decide whether to take it or not.    07/20/23 TC:  Pt was called @ 10:20a for follow up appt because of the recent refill request from the pharmacy.  She said the bottle she has has Duloxetine HCL which is different than the one she was taking.  She also states she went from 30mg  to 60mg  to 90mg .  She wants someone to call her back confirm the medication and the dosage.     Patient reports that  she is taking 120 mg of duloxetine and has for about 1-1/2 to 2 weeks.  Rx was sent for #90 tablets but one every day. She said you told her that she could take more. Even though it hasn't been that long that she has been taking this dose she feels like she is seeing some benefit and is reporting no SE. She wants to know if she can take two 60 mg tablets together or if she should do AM and PM. She has FU 12/5.     MD resp:  She can take 2 of the duloxetine 60 mg daily if she wants to try it for mood and chronic pain.  Higher doses can help pain.  The SE to look for is excessive sweating.     09/13/23 appt noted:  seen in person. Psych meds: duloxetine 120 AM, modafinil 200 up until a week ago, on oxycodone 5 q 6 hr. Trouble getting into pain clinic.  Multiple joint px including back and others.  Will not get better.  H is 68 yo. Switch to duloxetine helped a little with pain and mood.  Good change.   Still cry all the time.  Disruptive.   Dealing with so much change in her life worse with holidays. Need to switch back to Vyvanse for ADD.  Had some left and resumed some Vyvanse left over a week ago.  Helps her focus and productivity. D moved back to [place].  They bought a house in [place] outside [place].  Other D going through divorce in [place].   GS Fraser Din 68 yo is doing relatively well but needs constant care.   Modafinil helped alertness but not focus. Plan: Auvelity for crying .  1 in AM.  Resume Vyvanse  01/09/24 appt noted: Med: duloxetine 120, Vyvanse 50 AM, (never took Auvelity bc stopped crying), melatonin for sleep Injx back is already helping some but using cane. Recent cellulitis.  Difficult to treat.   Needs to increase vyvanse.   With Vyvanse mood better bc less frustrated with productivity.  Activity limited by severe arthritis. Asks to take low dose Xanax for insomnia initial.    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  Helping to care for  73-month-old [place] in [place].  Past Psychiatric Medication Trials:  trazodone hangover, Ambien, belsomra, Dayvigo to expensive, melatonin, Benadryl hangover and combos (Lunesta never tried) Xanax hangover Fluoxetine 60, sertraline NR, duloxetine, Lexapro, Wellbutrin, buspirone,  Ritalin LA, Concerta 54, Adderall 20 dizzy, Vyvanse 60 Lyrica sedation  Under care at this office since July 2002   Review of Systems:  Review of Systems  Cardiovascular:  Negative for palpitations.  Musculoskeletal:  Positive for arthralgias, back pain, gait problem and joint swelling.  Skin:  Positive for rash.  Neurological:  Negative for dizziness and tremors.  Psychiatric/Behavioral:  Positive for decreased concentration and sleep disturbance. Negative for agitation, behavioral problems, confusion, dysphoric mood, hallucinations, self-injury and suicidal ideas. The patient is not nervous/anxious and is not hyperactive.     Medications: I have reviewed the patient's current medications.  Current Outpatient Medications  Medication Sig Dispense Refill   acetaminophen (TYLENOL) 500 MG tablet Take 1,000 mg by mouth every 6 (six) hours as needed.     ALPRAZolam (XANAX) 0.25 MG tablet Take 1 tablet (0.25 mg total) by mouth at bedtime as needed for anxiety. 30 tablet 2   aspirin 81 MG tablet Take 81 mg by mouth daily.     b complex vitamins capsule Take 1 capsule by mouth daily.     calcium carbonate (OS-CAL - DOSED IN MG OF ELEMENTAL CALCIUM) 1250 (500 Ca) MG tablet      celecoxib (CELEBREX) 200 MG capsule TAKE 1 CAPSULE BY MOUTH TWICE A DAY 180 capsule 1   cephALEXin (KEFLEX) 500 MG capsule Take 1 capsule (500 mg total) by mouth 3 (three) times daily. 21 capsule 0   cholecalciferol (VITAMIN D) 1000 UNITS tablet Take 2,000 Units by mouth daily.     clobetasol (TEMOVATE) 0.05 % external solution as needed.     cyanocobalamin (VITAMIN B12) 1000 MCG tablet Take 1,000 mcg by mouth daily.     diclofenac Sodium  (VOLTAREN) 1 % GEL APPLY 2 GRAMS TO AFFECTED AREA 4 TIMES A DAY 300 g 3   fluocinonide ointment (LIDEX) 0.05 % APPLY TWICE DAILY TO AFFECTED AREAS UNTIL IMPROVED THEN AS NEEDED FOR FLARES     furosemide (LASIX) 20 MG tablet Take 1 tablet (20 mg total) by mouth daily. 7 tablet 0   leflunomide (ARAVA) 20 MG tablet Take 20 mg by mouth daily.     [START ON 02/06/2024] lisdexamfetamine (VYVANSE) 70 MG capsule Take 1 capsule (70 mg total) by mouth daily. 30 capsule 0   [START ON 03/05/2024] lisdexamfetamine (VYVANSE) 70 MG capsule Take 1 capsule (70 mg total) by mouth daily. 30 capsule 0   Magnesium 250 MG CAPS Take by mouth. Take two by mouth daily     melatonin 5 MG TABS Take 5 mg by mouth at bedtime.     Multiple Vitamin (MULTIVITAMIN) tablet Take 1 tablet by mouth daily.     mupirocin ointment (BACTROBAN) 2 % Apply 1 Application topically 2 (two) times daily. 22 g 0   nystatin-triamcinolone ointment (MYCOLOG) Apply 1 application topically 2 (two) times daily. 30 g 1   Omega-3 Fatty Acids (FISH OIL) 1200 MG CAPS Take 1,200 mg by mouth daily.  oxyCODONE-acetaminophen (PERCOCET/ROXICET) 5-325 MG tablet Take 1 tablet by mouth every 6 (six) hours as needed for moderate pain (pain score 4-6). Do not refill less than 30 days from prior refill 120 tablet 0   Polyethyl Glycol-Propyl Glycol 0.4-0.3 % SOLN Place 1 drop into both eyes daily as needed (for dry eyes).     Probiotic Product (PROBIOTIC DAILY PO) Take 1 capsule by mouth daily.     solifenacin (VESICARE) 10 MG tablet TAKE 1 TABLET BY MOUTH EVERY DAY 90 tablet 1   triamcinolone cream (KENALOG) 0.1 % APPLY TO AFFECTED AREA TWICE A DAY UNTIL CLEAR THEN AS NEEDED     trospium (SANCTURA) 20 MG tablet Take 20 mg by mouth 2 (two) times daily.     DULoxetine (CYMBALTA) 60 MG capsule Take 2 capsules (120 mg total) by mouth daily. 180 capsule 3   Current Facility-Administered Medications  Medication Dose Route Frequency Provider Last Rate Last Admin    mupirocin cream (BACTROBAN) 2 %   Topical BID         Medication Side Effects: None  Allergies:  Allergies  Allergen Reactions   Cosentyx [Secukinumab] Rash    Severe case of psorasis   Sertraline Hcl Other (See Comments)    Made me crazy    Adalimumab     Other Reaction(s): psoriasis   Avelox [Moxifloxacin Hcl In Nacl]     Pt cant remember reaction   Nitrofurantoin     Other reaction(s): Other (See Comments) tired Pt cant remember reaction Other reaction(s): Unknown   Pregabalin     Cognitive issues   Rosuvastatin     Other reaction(s): Unknown   Latex Itching and Rash    Redness   Macrobid [Nitrofurantoin Monohyd Macro] Other (See Comments)    tired   Statins Other (See Comments)    Muscle aches    Past Medical History:  Diagnosis Date   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   ADHD (attention deficit hyperactivity disorder)    Allergy    Anxiety    Arthritis    Cataract    bil cateracts removed   Complication of anesthesia    first colonoscopy pt states she woke up   Depression    GERD (gastroesophageal reflux disease)    Globus sensation    Hyperlipidemia    Incontinence    bowels at times   Insomnia    Multinodular goiter (nontoxic)    Psoriatic arthritis (HCC)    Sjogren's syndrome (HCC) 11/2023   secondary   Spinal stenosis of lumbar region 03/10/2015   MRI    Statin intolerance 01/27/2013    Family History  Problem Relation Age of Onset   Heart disease Mother    Hyperlipidemia Mother    Coronary artery disease Father 38       CABG in early 78's   Hyperlipidemia Father    Epilepsy Grandchild        Severe form   Colon cancer Neg Hx    Esophageal cancer Neg Hx    Rectal cancer Neg Hx    Stomach cancer Neg Hx    Colon polyps Neg Hx    Crohn's disease Neg Hx     Social History   Socioeconomic History   Marital status: Married    Spouse name: Not on file   Number of children: 2   Years of education: Not on file   Highest education  level: Not on file  Occupational History   Occupation: disability  Tobacco Use   Smoking status: Former    Current packs/day: 0.00    Types: Cigarettes    Quit date: 08/21/1971    Years since quitting: 52.4    Passive exposure: Never   Smokeless tobacco: Never  Vaping Use   Vaping status: Never Used  Substance and Sexual Activity   Alcohol use: Yes    Alcohol/week: 7.0 standard drinks of alcohol    Types: 7 Glasses of wine per week    Comment: daily,wine   Drug use: No   Sexual activity: Not on file  Other Topics Concern   Not on file  Social History Narrative   Married, retired Charity fundraiser   2 daughters some grandchildren   1 caffeinated drinks daily   1 alcoholic beverage daily   No tobacco      Social Drivers of Corporate investment banker Strain: Low Risk  (08/20/2023)   Overall Financial Resource Strain (CARDIA)    Difficulty of Paying Living Expenses: Not hard at all  Food Insecurity: No Food Insecurity (08/20/2023)   Hunger Vital Sign    Worried About Running Out of Food in the Last Year: Never true    Ran Out of Food in the Last Year: Never true  Transportation Needs: No Transportation Needs (08/20/2023)   PRAPARE - Administrator, Civil Service (Medical): No    Lack of Transportation (Non-Medical): No  Physical Activity: Inactive (08/20/2023)   Exercise Vital Sign    Days of Exercise per Week: 0 days    Minutes of Exercise per Session: 0 min  Stress: Stress Concern Present (08/20/2023)   [employer] of Occupational Health - Occupational Stress Questionnaire    Feeling of Stress : Very much  Social Connections: Moderately Integrated (08/20/2023)   Social Connection and Isolation Panel [NHANES]    Frequency of Communication with Friends and Family: More than three times a week    Frequency of Social Gatherings with Friends and Family: Twice a week    Attends Religious Services: Never    Database administrator or Organizations: Yes    Attends Probation officer: More than 4 times per year    Marital Status: Married  Catering manager Violence: Not At Risk (01/03/2024)   Received from [facility]   HITS    Over the last 12 months how often did your partner physically hurt you?: Never    Over the last 12 months how often did your partner insult you or talk down to you?: Never    Over the last 12 months how often did your partner threaten you with physical harm?: Never    Over the last 12 months how often did your partner scream or curse at you?: Never    Past Medical History, Surgical history, Social history, and Family history were reviewed and updated as appropriate.   Please see review of systems for further details on the patient's review from today.   Objective:   Physical Exam:  There were no vitals taken for this visit.  Physical Exam Constitutional:      General: She is not in acute distress.    Appearance: Normal appearance. She is well-developed.  Musculoskeletal:        General: No deformity.  Neurological:     Mental Status: She is alert and oriented to person, place, and time.     Motor: No tremor.     Gait: Gait normal.     Comments: Using walker  Psychiatric:        Attention and Perception: Perception normal. She is inattentive.        Mood and Affect: Mood is anxious and depressed. Affect is not labile, angry or inappropriate.        Speech: Speech normal. Speech is not slurred.        Behavior: Behavior normal.        Thought Content: Thought content normal. Thought content is not delusional. Thought content does not include homicidal or suicidal ideation. Thought content does not include suicidal plan.        Cognition and Memory: Cognition normal.        Judgment: Judgment normal.     Comments: Insight intact. No auditory or visual hallucinations. No delusions.  Talkative per usual. Mood less down with duloxetine Some chronic ADD     Lab Review:     Component Value Date/Time   NA  141 11/13/2023 1506   K 4.5 11/13/2023 1506   CL 103 11/13/2023 1506   CO2 28 11/13/2023 1506   GLUCOSE 96 11/13/2023 1506   BUN 25 (H) 11/13/2023 1506   CREATININE 0.75 11/13/2023 1506   CALCIUM 9.2 11/13/2023 1506   PROT 6.4 11/13/2023 1506   ALBUMIN 3.8 11/13/2023 1506   AST 18 11/13/2023 1506   ALT 14 11/13/2023 1506   ALKPHOS 57 11/13/2023 1506   BILITOT 0.4 11/13/2023 1506   GFRNONAA >60 02/18/2016 0322   GFRAA >60 02/18/2016 0322       Component Value Date/Time   WBC 5.0 12/17/2023 1506   RBC 3.55 (L) 12/17/2023 1506   HGB 11.1 (L) 12/17/2023 1506   HCT 33.7 (L) 12/17/2023 1506   PLT 310.0 12/17/2023 1506   MCV 95.0 12/17/2023 1506   MCH 31.0 02/18/2016 0322   MCHC 32.9 12/17/2023 1506   RDW 13.5 12/17/2023 1506   LYMPHSABS 0.9 05/16/2023 1434   MONOABS 0.5 05/16/2023 1434   EOSABS 0.0 05/16/2023 1434   BASOSABS 0.1 05/16/2023 1434    No results found for: "POCLITH", "LITHIUM"   No results found for: "PHENYTOIN", "PHENOBARB", "VALPROATE", "CBMZ"   .res Assessment: Plan:    Ashyah "Milli" was seen today for follow-up, depression, anxiety, add and sleeping problem.  Diagnoses and all orders for this visit:  Depression, major, recurrent, in partial remission (HCC) -     DULoxetine (CYMBALTA) 60 MG capsule; Take 2 capsules (120 mg total) by mouth daily.  Attention deficit hyperactivity disorder (ADHD), predominantly inattentive type -     Discontinue: lisdexamfetamine (VYVANSE) 50 MG capsule; Take 1 capsule (50 mg total) by mouth daily. -     lisdexamfetamine (VYVANSE) 70 MG capsule; Take 1 capsule (70 mg total) by mouth daily. -     lisdexamfetamine (VYVANSE) 70 MG capsule; Take 1 capsule (70 mg total) by mouth daily.  Insomnia due to mental condition -     ALPRAZolam (XANAX) 0.25 MG tablet; Take 1 tablet (0.25 mg total) by mouth at bedtime as needed for anxiety.  Other chronic back pain -     DULoxetine (CYMBALTA) 60 MG capsule; Take 2 capsules (120  mg total) by mouth daily.   psoriatic arthritis, OA, and inflammatory   40 min with patient was spent on counseling and coordination of care. We discussed the following.   ADD not as well controlled. Some mood and pain benefit with duloxetine 120 and tolerating it. But crying excessively despite mood improvment  Discussed potential benefits, risks, and side effects of stimulants  with patient to include increased heart rate, palpitations, insomnia, increased anxiety, increased irritability, or decreased appetite.  Instructed patient to contact office if experiencing any significant tolerability issues.  Disc risk steroids affecting mood and sleep.  Supportive therapy dealing with stubborn H with memory problems. Disc  rheum consultation and stress from dealing with it.    Resume Vyvanse 70 mg for ADD  Looking for better focus.  Consider memantine off label because of chronic poorly controlled ADHD. Option Qelbree Option retry lower dose Namenda off label.  Better mood and pain with Switch duloxetine to 120 mg daily. For mood and potentially for pain. Cannot go higher.   No longer needs it : Auvelity for crying .  1 in AM.  Disc SE.  Disc info in detail about how DM can help crying.    Asks to go back to alprazolam 0.125 mg for sleep   No other med changes  Consider alternative treatments for ADD including off label.  FU 4-6 mos  Meredith Staggers, MD, DFAPA   Please see After Visit Summary for patient specific instructions.  Future Appointments  Date Time Provider Department Center  01/17/2024 12:00 PM [facility] MM 2 [facility]  01/22/2024  2:00 PM Delano Metz, MD [facility] None  01/23/2024  1:00 PM Sherlene Shams, MD [facility]  01/28/2024  1:00 PM [facility] VASC 1 [facility] None  01/28/2024  1:45 PM Schnier, Latina Craver, MD [facility] None  02/11/2024  2:30 PM Sherlene Shams, MD [facility]  08/22/2024 10:50 AM [facility] ANNUAL WELLNESS VISIT [facility]    No  orders of the defined types were placed in this encounter.      -------------------------------

## 2024-01-09 NOTE — Telephone Encounter (Signed)
 Post procedure follow up.  Patient states she is doing well.   ?

## 2024-01-11 ENCOUNTER — Telehealth: Payer: Self-pay | Admitting: Psychiatry

## 2024-01-11 NOTE — Telephone Encounter (Signed)
 [facility] [employer] in [place] called at 4:15p.  They state pt is trying to refill Vyvanse 70mg .  She said she was told to start taking it now, but they have a note that says not to fill before later this month.  They want someone to call them back to confirm what's correct.  Next appt 9/3

## 2024-01-11 NOTE — Telephone Encounter (Signed)
 She last filled 50 mg Vyvanse on 3/28. She was seen this week and dose increased to 70 mg. You sent in 2 scripts, the first with a fill date of 4/30. She is telling pharmacy she is supposed to start now. Please advise.

## 2024-01-17 ENCOUNTER — Other Ambulatory Visit: Payer: Self-pay | Admitting: Internal Medicine

## 2024-01-17 ENCOUNTER — Other Ambulatory Visit: Payer: Self-pay | Admitting: Psychiatry

## 2024-01-17 ENCOUNTER — Ambulatory Visit
Admission: RE | Admit: 2024-01-17 | Discharge: 2024-01-17 | Disposition: A | Discharge status: Home / Self Care | Source: Ambulatory Visit | Attending: Internal Medicine | Admitting: Internal Medicine

## 2024-01-17 DIAGNOSIS — Z1231 Encounter for screening mammogram for malignant neoplasm of breast: Secondary | ICD-10-CM

## 2024-01-17 MED ORDER — OXYCODONE-ACETAMINOPHEN 5-325 MG PO TABS: 1.0000 | ORAL_TABLET | Freq: Four times a day (QID) | ORAL | 0 refills | Status: DC | PRN

## 2024-01-17 MED ORDER — LISDEXAMFETAMINE DIMESYLATE 70 MG PO CAPS: 70.0000 mg | ORAL_CAPSULE | Freq: Every day | ORAL | 0 refills | Status: DC

## 2024-01-17 NOTE — Telephone Encounter (Signed)
 [facility] called at 1:24 with questions about filling Madeline Young's Vyvanse at the new dose.  Prescription says fill date is 4/30 but Madeline Young is telling she is supposed to go ahead and start at the new dose.  If you want is filled now, they will need a new script with a current date.  If you want her to finish her 50mg  and wait until 4/30 please call her and let her know.

## 2024-01-17 NOTE — Telephone Encounter (Signed)
 Copied from CRM [id number]. Topic: Clinical - Medication Refill >> Jan 17, 2024 12:35 PM Efraim Kaufmann C wrote: Most Recent Primary Care Visit:  Provider: Sherlene Shams  Department: [facility]  Visit Type: OFFICE VISIT  Date: 12/28/2023  Medication: oxyCODONE-acetaminophen (PERCOCET/ROXICET) 5-325 MG tablet  Has the patient contacted their pharmacy? Yes (Agent: If no, request that the patient contact the pharmacy for the refill. If patient does not wish to contact the pharmacy document the reason why and proceed with request.) (Agent: If yes, when and what did the pharmacy advise?)  Is this the correct pharmacy for this prescription? Yes If no, delete pharmacy and type the correct one.  This is the patient's preferred pharmacy:  [id number] [place] [place] - [address] [place] [address] Phone: [phone] Fax: [phone]    Has the prescription been filled recently? No  Is the patient out of the medication? Yes  Has the patient been seen for an appointment in the last year OR does the patient have an upcoming appointment? Yes  Can we respond through MyChart? Yes  Agent: Please be advised that Rx refills may take up to 3 business days. We ask that you follow-up with your pharmacy.

## 2024-01-17 NOTE — Telephone Encounter (Signed)
 Pt was seen 4/2. You increased her Vyvanse from 50 mg to 70 mg, but Rx was sent for fill date of 4/30. She told pharmacy she was supposed to start it on 4/2. Pharmacy is now calling and asking when she should start new dose.  Last filled 3/28, due 3/25.

## 2024-01-17 NOTE — Telephone Encounter (Signed)
 [facility] to [facility] with note it could be filled today.

## 2024-01-21 ENCOUNTER — Telehealth: Payer: Self-pay

## 2024-01-21 NOTE — Telephone Encounter (Signed)
 Copied from CRM [id number]. Topic: General - Other >> Jan 21, 2024 12:09 PM Albertha Alosa wrote: Reason for CRM: Patient called in regarding a missed call from [facility], is requesting a callback

## 2024-01-21 NOTE — Telephone Encounter (Signed)
 Called pt back to reschedule her appt for Wednesday and pt stated that she had already called and canceled it.

## 2024-01-22 ENCOUNTER — Other Ambulatory Visit (INDEPENDENT_AMBULATORY_CARE_PROVIDER_SITE_OTHER): Payer: Self-pay | Admitting: Nurse Practitioner

## 2024-01-22 ENCOUNTER — Encounter: Payer: Self-pay | Admitting: Pain Medicine

## 2024-01-22 ENCOUNTER — Ambulatory Visit: Attending: Pain Medicine | Admitting: Pain Medicine

## 2024-01-22 VITALS — BP 126/80 | HR 82 | Temp 97.6°F | Resp 16 | Ht 65.0 in | Wt 145.0 lb

## 2024-01-22 DIAGNOSIS — M48062 Spinal stenosis, lumbar region with neurogenic claudication: Secondary | ICD-10-CM | POA: Diagnosis present

## 2024-01-22 DIAGNOSIS — M545 Low back pain, unspecified: Secondary | ICD-10-CM | POA: Insufficient documentation

## 2024-01-22 DIAGNOSIS — M961 Postlaminectomy syndrome, not elsewhere classified: Secondary | ICD-10-CM | POA: Diagnosis not present

## 2024-01-22 DIAGNOSIS — M47816 Spondylosis without myelopathy or radiculopathy, lumbar region: Secondary | ICD-10-CM | POA: Insufficient documentation

## 2024-01-22 DIAGNOSIS — M5417 Radiculopathy, lumbosacral region: Secondary | ICD-10-CM | POA: Insufficient documentation

## 2024-01-22 DIAGNOSIS — R600 Localized edema: Secondary | ICD-10-CM | POA: Diagnosis present

## 2024-01-22 DIAGNOSIS — M5459 Other low back pain: Secondary | ICD-10-CM | POA: Diagnosis present

## 2024-01-22 DIAGNOSIS — Z09 Encounter for follow-up examination after completed treatment for conditions other than malignant neoplasm: Secondary | ICD-10-CM | POA: Diagnosis present

## 2024-01-22 DIAGNOSIS — M79604 Pain in right leg: Secondary | ICD-10-CM | POA: Insufficient documentation

## 2024-01-22 DIAGNOSIS — M79605 Pain in left leg: Secondary | ICD-10-CM | POA: Diagnosis present

## 2024-01-22 DIAGNOSIS — M4316 Spondylolisthesis, lumbar region: Secondary | ICD-10-CM | POA: Insufficient documentation

## 2024-01-22 DIAGNOSIS — R937 Abnormal findings on diagnostic imaging of other parts of musculoskeletal system: Secondary | ICD-10-CM | POA: Diagnosis present

## 2024-01-22 DIAGNOSIS — M51362 Other intervertebral disc degeneration, lumbar region with discogenic back pain and lower extremity pain: Secondary | ICD-10-CM | POA: Insufficient documentation

## 2024-01-22 DIAGNOSIS — L98491 Non-pressure chronic ulcer of skin of other sites limited to breakdown of skin: Secondary | ICD-10-CM

## 2024-01-22 NOTE — Patient Instructions (Signed)

## 2024-01-22 NOTE — Progress Notes (Signed)
 PROVIDER NOTE: Interpretation of information contained herein should be left to medically-trained personnel. Specific patient instructions are provided elsewhere under "Patient Instructions" section of medical record. This document was created in part using AI and STT-dictation technology, any transcriptional errors that may result from this process are unintentional.  Patient: Madeline Young  Service: E/M   PCP: Sherlene Shams, MD  DOB: 12/20/1955  DOS: 01/22/2024  Provider: Oswaldo Done, MD  MRN: [id number]  Delivery: Face-to-face  Specialty: Interventional Pain Management  Type: Established Patient  Setting: Ambulatory outpatient facility  Specialty designation: 09  Referring Prov.: Sherlene Shams, MD  Location: Outpatient office facility       HPI  [name], a 68 y.o. year old female, is here today because of her Failed back surgical syndrome [M96.1]. Ms. Mausolf primary complain today is Back Pain (Lumbar right is worse ) and Hip Pain (Right )  Pertinent problems: Ms. 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); Chronic low back pain (1ry area of Pain) (Bilateral) (R>L) w/o sciatica; Chronic hip pain (2ry area of Pain) (Bilateral) (R>L); Decreased range of motion of hips (Bilateral); Low back pain of over 3 months duration; Low back pain radiating to legs (Bilateral); Leg swelling; Abnormal CT myelogram of lumbar spine (02/15/2023); Degenerative joint disease involving multiple joints; Cervical spondylosis without myelopathy; Lumbar stenosis with neurogenic claudication; Spinal stenosis at L4-L5 level; Pain in thoracic spine; Acquired  scoliosis; Lumbar facet Arthropathy; Lumbar radiculitis; Chronic lumbar radiculopathy (S1>L5) (Right) by EMG; Pseudoarthrosis of lumbar spine; Dextroscoliosis of lumbar spine; Thoracic radiculopathy; Abnormal NCS (nerve conduction studies) [facility]) (09/27/2023); Median nerve neuropathy (Bilateral); 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 Pain Assessment: Severity of   is reported as a 4 /10. Location: Back (right hip) Lower, Right/? into right hip. Onset: More than a month ago. Quality: Discomfort, Pressure (pain occurs when walking or standing for very long.). Timing: Constant. Modifying factor(s): rest, procedure helped. Vitals:  height is 5\' 5"  (1.651 m) and weight is 145 lb (65.8 kg). Her temporal temperature is 97.6 F (36.4 C). Her blood pressure is 126/80 and her pulse is 82. Her respiration is 16 and oxygen saturation is 98%.  BMI: Estimated body mass index is 24.13 kg/m as calculated from the following:   Height as of this encounter: 5\' 5"  (1.651 m).   Weight as of this encounter: 145 lb (65.8 kg). Last encounter: 12/19/2023. Last procedure: 01/08/2024.  Reason for encounter: post-procedure evaluation and assessment.  Discussed the use of AI scribe software for clinical note transcription with the patient, who gave verbal consent to proceed.  History of Present Illness   Madeline Young "Madeline Young" is a 68 year old female with a history of lumbar fusion who presents with persistent back and hip  pain.  She experiences persistent pain  primarily in her right hip, which is most severe at night and in the morning. The pain can also occur during the day, particularly when she is very tired. She underwent a lumbar epidural steroid injection at the T12-L1 level on January 08, 2024, which initially provided relief for the pain radiating towards the groin area. However, she continues to experience pain in the lower back and right hip.  Following the injection, she experienced numbness in the upper thigh and a burning soreness in the thigh, which resolved after about a week. The injection alleviated the 'tweaky pain' she previously felt in the groin area.  She has a history of back surgery from L4 to S1, which she believes may be contributing to her current symptoms. She describes a pressure feeling in the lower middle back when standing for extended periods, which necessitates sitting down to relieve the discomfort. She also experiences tightness in the middle of her back, which can extend slightly higher.  She mentions significant swelling in her left leg, which has since resolved. She attributes the resolution to prednisone, as other treatments like Lasix and Raisin were ineffective. She is relieved that the swelling has subsided, allowing her to walk better.  She is planning to travel out of the country on March 11, 2024, for ten days.      Post-procedure evaluation   Type: Lumbar epidural steroid injection (LESI) (interlaminar) #1    Laterality: Right   Level:  T12-L1 Level.  Imaging: Fluoroscopic guidance Spinal ([id number]) Anesthesia: Local anesthesia (1-2% Lidocaine) Anxiolysis: IV Versed 2.0 mg Sedation: Moderate Sedation                       DOS: 01/08/2024  Performed by: Candi Chafe, MD  Purpose: Diagnostic/Therapeutic Indications: Lumbar radicular pain of intraspinal etiology of more than 4 weeks that has failed to respond to conservative therapy and is severe enough to impact  quality of life or function. 1. 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 Low back pain of over 3 months duration   3. Low back pain radiating to legs (Bilateral)   4. Lower extremity edema   5. Lumbar DDD w/ LBP & LEP   6. Lumbosacral radiculopathy at L5 (Right) by EMG   7. Lumbar stenosis with neurogenic claudication   8. Lumbosacral radiculopathy at S1 (Right) by EMG   9. Spondylolisthesis of lumbar region   10. Abnormal MRI, lumbar spine (04/19/2023) ([facility])   11. Latex precautions, history of latex allergy   12. At high risk for allergic reaction to latex   13. Chronic low back pain (1ry area of Pain) (Bilateral) w/o sciatica   14. Chronic hip pain (2ry area of Pain) (Bilateral)   15. Chronic lumbar radiculopathy (S1>L5) (Right) by EMG   16. Fusion of lumbar spine   17. Pain from implanted hardware, sequela   18. Abnormal CT myelogram of lumbar spine (02/15/2023)   19. Prophylactic antibiotic    NAS-11 Pain score:   Pre-procedure: 5 /10   Post-procedure: 0-No pain/10    Effectiveness:  Initial hour after procedure: 90 %. Subsequent 4-6 hours post-procedure: 60 % (developed a soreness in her left  thigh.  swelling went down in her left leg dramatically and now her balance seems to be better.). Analgesia past initial 6 hours: 70 % (continues to bother her at night and early in the morning.  4th morning PP back pain gone. please see diary.). Ongoing improvement:  Analgesic:  The patient indicates having attained a 90 to 100% relief of pain for the low show local anesthetic followed by a decrease to about 70% improvement.  However, further questioning revealed that the pain that she was having in the distribution of the T12/L1 radiculitis/radiculopathy seems to have gone away and has not returned indicating 100% improvement of those symptoms.  However, the patient continues to have pain in the lower portion of the back which we are yet to treat. Function: Ms. Tieszen  reports improvement in function ROM: Ms. Lemar reports improvement in ROM   Pharmacotherapy Assessment  Analgesic: No chronic opioid analgesics therapy prescribed by our practice. oxycodone/APAP 5/325, 1 tab p.o. 4 times daily (120/month) (# 120) (last filled on 11/06/2023) MME/day: 30 mg/day    Monitoring: [place] PMP: PDMP not reviewed this encounter.       Pharmacotherapy: No side-effects or adverse reactions reported. Compliance: No problems identified. Effectiveness: Clinically acceptable.  No notes on file  No results found for: "CBDTHCR" No results found for: "D8THCCBX" No results found for: "D9THCCBX"  UDS:  Summary  Date Value Ref Range Status  11/28/2023 FINAL  Final    Comment:    ==================================================================== Compliance Drug Analysis, Ur ==================================================================== Test                             Result       Flag       Units  Drug Present and Declared for Prescription Verification   Amphetamine                    [phone]        EXPECTED   ng/mg creat    Amphetamine is available as a schedule II prescription drug.    Oxycodone                      1414         EXPECTED   ng/mg creat   Oxymorphone                    332          EXPECTED   ng/mg creat   Noroxycodone                   6379         EXPECTED   ng/mg creat   Noroxymorphone                 137          EXPECTED   ng/mg creat    Sources of oxycodone are scheduled prescription medications.    Oxymorphone, noroxycodone, and noroxymorphone are expected    metabolites of oxycodone. Oxymorphone is also available as a    scheduled prescription medication.    Duloxetine                     PRESENT      EXPECTED   Acetaminophen                  PRESENT      EXPECTED   Diclofenac                     PRESENT      EXPECTED  Drug Present not Declared for Prescription Verification   Alprazolam  41           UNEXPECTED  ng/mg creat   Alpha-hydroxyalprazolam        48           UNEXPECTED ng/mg creat    Source of alprazolam is a scheduled prescription medication. Alpha-    hydroxyalprazolam is an expected metabolite of alprazolam.    Carboxy-THC                    6            UNEXPECTED ng/mg creat    Carboxy-THC is a metabolite of tetrahydrocannabinol (THC). Source of    THC is most commonly herbal marijuana or marijuana-based products,    but THC is also present in a scheduled prescription medication.    Trace amounts of THC can be present in hemp and cannabidiol (CBD)    products. This test is not intended to distinguish between delta-9-    tetrahydrocannabinol, the predominant form of THC in most herbal or    marijuana-based products, and delta-8-tetrahydrocannabinol.  Drug Absent but Declared for Prescription Verification   Salicylate                     Not Detected UNEXPECTED    Aspirin, as indicated in the declared medication list, is not always    detected even when used as directed.  ==================================================================== Test                      Result    Flag   Units      Ref Range   Creatinine              147              mg/dL      >=75 ==================================================================== Declared Medications:  The flagging and interpretation on this report are based on the  following declared medications.  Unexpected results may arise from  inaccuracies in the declared medications.   **Note: The testing scope of this panel includes these medications:   Amphetamine (Vyvanse)  Duloxetine (Cymbalta)  Oxycodone   **Note: The testing scope of this panel does not include small to  moderate amounts of these reported medications:   Acetaminophen (Tylenol)  Acetaminophen  Aspirin  Diclofenac (Voltaren)   **Note: The testing scope of this panel does not include the  following reported medications:   Calcium  Celecoxib (Celebrex)   Clobetasol (Temovate)  Eye Drops  Fish Oil  Fluocinonide (Lidex)  Leflunomide (Arava)  Magnesium  Melatonin  Multivitamin  Probiotic  Solifenacin (Vesicare)  Topical  Triamcinolone (Kenalog)  Trospium (Sanctura)  Turmeric  Vitamin B  Vitamin B12  Vitamin D ==================================================================== For clinical consultation, please call [phone]. ====================================================================       ROS  Constitutional: Denies any fever or chills Gastrointestinal: No reported hemesis, hematochezia, vomiting, or acute GI distress Musculoskeletal: Denies any acute onset joint swelling, redness, loss of ROM, or weakness Neurological: No reported episodes of acute onset apraxia, aphasia, dysarthria, agnosia, amnesia, paralysis, loss of coordination, or loss of consciousness  Medication Review  ALPRAZolam, DULoxetine, Fish Oil, Magnesium, Polyethyl Glycol-Propyl Glycol, Probiotic Product, acetaminophen, aspirin, b complex vitamins, calcium carbonate, celecoxib, cholecalciferol, clobetasol, cyanocobalamin, diclofenac Sodium, fluocinonide ointment, furosemide, leflunomide, lisdexamfetamine, melatonin, multivitamin, mupirocin ointment, nystatin-triamcinolone ointment, oxyCODONE-acetaminophen, solifenacin, triamcinolone cream, and trospium  History Review  Allergy: Ms. Collet is allergic to cosentyx [secukinumab], sertraline hcl, adalimumab, avelox [moxifloxacin hcl in nacl],  nitrofurantoin, pregabalin, rosuvastatin, latex, macrobid [nitrofurantoin monohyd macro], and statins. Drug: Ms. Nicks  reports no history of drug use. Alcohol:  reports current alcohol use of about 7.0 standard drinks of alcohol per week. Tobacco:  reports that she quit smoking about 52 years ago. Her smoking use included cigarettes. She has never been exposed to tobacco smoke. She has never used smokeless tobacco. Social: Ms. Claycomb  reports that she quit  smoking about 52 years ago. Her smoking use included cigarettes. She has never been exposed to tobacco smoke. She has never used smokeless tobacco. She reports current alcohol use of about 7.0 standard drinks of alcohol per week. She reports that she does not use drugs. Medical:  has a past medical history of Adenoma (10/06/2008), ADHD (attention deficit hyperactivity disorder), Allergy, Anxiety, Arthritis, Cataract, Complication of anesthesia, Depression, GERD (gastroesophageal reflux disease), Globus sensation, Hyperlipidemia, Incontinence, Insomnia, Multinodular goiter (nontoxic), Psoriatic arthritis (HCC), Sjogren's syndrome (HCC) (11/2023), Spinal stenosis of lumbar region (03/10/2015), and Statin intolerance (01/27/2013). Surgical: Ms. Rodger  has a past surgical history that includes Foot surgery (2006); Colonoscopy w/ polypectomy; Upper gastrointestinal endoscopy (2005); Tonsillectomy; Biopsy thyroid (2014); Eye surgery; Back surgery (02/17/2016); Colonoscopy; and Polypectomy. Family: family history includes Coronary artery disease (age of onset: 60) in her father; Epilepsy in her grandchild; Heart disease in her mother; Hyperlipidemia in her father and mother.  Laboratory Chemistry Profile   Renal Lab Results  Component Value Date   BUN 25 (H) 11/13/2023   CREATININE 0.75 11/13/2023   GFR 81.33 11/13/2023   GFRAA >60 02/18/2016   GFRNONAA >60 02/18/2016    Hepatic Lab Results  Component Value Date   AST 18 11/13/2023   ALT 14 11/13/2023   ALBUMIN 3.8 11/13/2023   ALKPHOS 57 11/13/2023   HCVAB NEGATIVE 01/07/2016    Electrolytes Lab Results  Component Value Date   NA 141 11/13/2023   K 4.5 11/13/2023   CL 103 11/13/2023   CALCIUM 9.2 11/13/2023   MG 2.3 11/28/2023    Bone Lab Results  Component Value Date   VD25OH 33.35 09/11/2017   25OHVITD1 59 11/28/2023   25OHVITD2 <1.0 11/28/2023   25OHVITD3 59 11/28/2023    Inflammation (CRP: Acute Phase) (ESR: Chronic Phase) Lab  Results  Component Value Date   CRP <1.0 05/16/2023   ESRSEDRATE 40 (H) 06/18/2023         Note: Above Lab results reviewed.  Recent Imaging Review  MM 3D SCREENING MAMMOGRAM BILATERAL BREAST CLINICAL DATA:  Screening.  EXAM: DIGITAL SCREENING BILATERAL MAMMOGRAM WITH TOMOSYNTHESIS AND CAD  TECHNIQUE: Bilateral screening digital craniocaudal and mediolateral oblique mammograms were obtained. Bilateral screening digital breast tomosynthesis was performed. The images were evaluated with computer-aided detection.  COMPARISON:  Previous exam(s).  ACR Breast Density Category b: There are scattered areas of fibroglandular density.  FINDINGS: There are no findings suspicious for malignancy.  IMPRESSION: No mammographic evidence of malignancy. A result letter of this screening mammogram will be mailed directly to the patient.  RECOMMENDATION: Screening mammogram in one year. (Code:SM-B-01Y)  BI-RADS CATEGORY  1: Negative.  Electronically Signed   By: Allena Ito M.D.   On: 01/22/2024 09:31 Note: Reviewed        Physical Exam  General appearance: Well nourished, well developed, and well hydrated. In no apparent acute distress Mental status: Alert, oriented x 3 (person, place, & time)       Respiratory: No evidence of acute respiratory distress Eyes: PERLA Vitals: BP 126/80 (BP Location: Left Arm,  Patient Position: Sitting, Cuff Size: Normal)   Pulse 82   Temp 97.6 F (36.4 C) (Temporal)   Resp 16   Ht 5\' 5"  (1.651 m)   Wt 145 lb (65.8 kg)   SpO2 98%   BMI 24.13 kg/m  BMI: Estimated body mass index is 24.13 kg/m as calculated from the following:   Height as of this encounter: 5\' 5"  (1.651 m).   Weight as of this encounter: 145 lb (65.8 kg). Ideal: Ideal body weight: 57 kg (125 lb 10.6 oz) Adjusted ideal body weight: 60.5 kg (133 lb 6.4 oz)  Assessment   Diagnosis Status  1. 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 Low back pain of  over 3 months duration   3. Low back pain radiating to legs (Bilateral)   4. Lower extremity edema   5. Lumbar DDD w/ LBP & LEP   6. Lumbosacral radiculopathy at L5 (Right) by EMG   7. Lumbar stenosis with neurogenic claudication   8. Lumbosacral radiculopathy at S1 (Right) by EMG   9. Spondylolisthesis of lumbar region   10. Abnormal MRI, lumbar spine (04/19/2023) ([facility])   11. Postop check   12. Lumbar facet Arthropathy   13. Lumbar facet joint pain    Controlled Controlled Controlled   Updated Problems: Problem  Lumbar Facet Joint Pain    Plan of Care  Problem-specific:  Assessment and Plan    Lumbar facet joint pain   She reports lower back pain, particularly at night and in the morning, worsened by standing and walking. The pain is localized to the right side with a pressure sensation in the middle of the back. Pain reproduced during the [facility] maneuver suggests facet joint involvement. Hardware from previous spinal fusion and severe scoliosis may contribute to the pain. Perform a hardware injection around the pedicle screws to assess for pain relief and determine if the hardware is contributing to the pain. Schedule the injection with sedation and ensure she has a driver.  Spinal fusion hardware   She has hardware from spinal fusion surgery in 2017, extending from L4 to S1. This hardware may contribute to lower back pain, particularly if affecting nerves near the facet joints.  Scoliosis   Severe scoliosis, potentially developed post-spinal fusion surgery in 2017, may contribute to back pain.  Resolved leg swelling   She previously experienced significant left leg swelling, unresponsive to diuretics like Lasix, possibly neurogenic in origin. The swelling resolved with prednisone use.  Follow-up   She plans to travel out of the country on June 3rd for ten days and is interested in receiving another injection before the trip. Injections should be scheduled at least two weeks  apart to allow the steroid to clear, though benefits may last longer. If frequent injections are needed, alternatives should be considered. Schedule the hardware injection at least two weeks apart from the previous injection and before her travel date.       [name] has a current medication list which includes the following long-term medication(s): calcium carbonate, duloxetine, furosemide, leflunomide, [START ON 02/06/2024] lisdexamfetamine, [START ON 03/05/2024] lisdexamfetamine, lisdexamfetamine, and oxycodone-acetaminophen.  Pharmacotherapy (Medications Ordered): No orders of the defined types were placed in this encounter.  Orders:  Orders Placed This Encounter  Procedures   LUMBAR FACET(MEDIAL BRANCH NERVE BLOCK) MBNB    Diagnosis: Lumbar Facet Syndrome (M47.816); Lumbosacral Facet Syndrome (M47.817); Lumbar Facet Joint Pain (M54.59) Medical Necessity Statement: 1.Severe chronic axial low back pain causing functional impairment  documented by ongoing pain scale assessments. 2.Pain present for longer than 3 months (Chronic) documented to have failed noninvasive conservative therapies. 3.Absence of untreated radiculopathy. 4.There is no radiological evidence of untreated fractures, tumor, infection, or deformity.  Physical Examination Findings: Positive Kemp Maneuver: (Y)  Positive Lumbar Hyperextension-Rotation provocative test: (Y)    Standing Status:   Future    Expiration Date:   04/22/2024    Scheduling Instructions:     Procedure: Lumbar facet Block     Type: Medial Branch Block     Side: Bilateral     Purpose: Diagnostic/Therapeutic     Level(s): L3-4, L4-5, L5-S1, and TBD by Fluoroscopic Mapping Facets (L2, L3, L4, L5, S1, and TBD Medial Branch)     Sedation: With Sedation.     Timeframe: As soon as schedule allows.    Where will this procedure be performed?:   [facility]   Nursing Instructions:    Please complete this patient's postprocedure  evaluation.    Scheduling Instructions:     Please complete this patient's postprocedure evaluation.   Follow-up plan:   Return for (ECT): (B) L-FCT (Hardware) Blk #1.     Interventional Therapies  Risk Factors  Considerations  Medical Comorbidities:    Poor candidate for RFA    Planned  Pending:   Diagnostic bilateral lumbar hardware/Facet block #1    Under consideration:   Diagnostic bilateral lumbar hardware/Facet block #1  Diagnostic caudal ESI + diagnostic epidurogram #1  Diagnostic bilateral IA hip & TB inj. #1  Possible spinal cord stimulator trial  EMG/PNCV of both lower extremities. (Only right side tested.)   Completed:   Diagnostic right T12-L1 LESI x1 (01/08/2024)    Therapeutic  Palliative (PRN) options:   None established   Completed by other providers:   Therapeutic left L2-3 TFESI x1 (06/05/2023) by Caffie Pinto, MD [facility])  Therapeutic right trochanteric bursa inj. x1 (09/14/2022) by Benjiman Core, MD [facility])  Therapeutic bilateral IA hip inj. 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     Recent Visits Date Type Provider Dept  01/08/24 Procedure visit Delano Metz, MD [facility]  12/19/23 Office Visit Delano Metz, MD [facility]  11/28/23 Office Visit Delano Metz, MD [facility]  Showing recent visits within past 90 days and meeting all other requirements Today's  Visits Date Type Provider Dept  01/22/24 Office Visit Delano Metz, MD [facility]  Showing today's visits and meeting all other requirements Future Appointments No visits were found meeting these conditions. Showing future appointments within next 90 days and meeting all other requirements  I discussed the assessment and treatment plan with the patient. The patient was provided an opportunity to ask questions and all were answered. The patient agreed with the plan and demonstrated an understanding of the instructions.  Patient advised to call back or seek an in-person evaluation if the symptoms or condition worsens.  Duration of encounter: 30 minutes.  Total time on encounter, as per  AMA guidelines included both the face-to-face and non-face-to-face time personally spent by the physician and/or other qualified health care professional(s) on the day of the encounter (includes time in activities that require the physician or other qualified health care professional and does not include time in activities normally performed by clinical staff). Physician's time may include the following activities when performed: Preparing to see the patient (e.g., pre-charting review of records, searching for previously ordered imaging, lab work, and nerve conduction tests) Review of prior analgesic pharmacotherapies. Reviewing PMP Interpreting ordered tests (e.g., lab work, imaging, nerve conduction tests) Performing post-procedure evaluations, including interpretation of diagnostic procedures Obtaining and/or reviewing separately obtained history Performing a medically appropriate examination and/or evaluation Counseling and educating the patient/family/caregiver Ordering medications, tests, or procedures Referring and communicating with other health care professionals (when not separately reported) Documenting clinical information in the electronic or other health record Independently  interpreting results (not separately reported) and communicating results to the patient/ family/caregiver Care coordination (not separately reported)  Note by: Candi Chafe, MD (TTS and AI technology used. I apologize for any typographical errors that were not detected and corrected.) Date: 01/22/2024; Time: 4:01 PM

## 2024-01-23 ENCOUNTER — Ambulatory Visit: Admitting: Internal Medicine

## 2024-01-28 ENCOUNTER — Encounter (INDEPENDENT_AMBULATORY_CARE_PROVIDER_SITE_OTHER): Payer: Self-pay | Admitting: Vascular Surgery

## 2024-01-28 ENCOUNTER — Encounter (INDEPENDENT_AMBULATORY_CARE_PROVIDER_SITE_OTHER): Payer: Self-pay

## 2024-02-04 ENCOUNTER — Other Ambulatory Visit: Payer: Self-pay | Admitting: Psychiatry

## 2024-02-04 DIAGNOSIS — F3341 Major depressive disorder, recurrent, in partial remission: Secondary | ICD-10-CM

## 2024-02-04 DIAGNOSIS — M5489 Other dorsalgia: Secondary | ICD-10-CM

## 2024-02-08 ENCOUNTER — Ambulatory Visit (INDEPENDENT_AMBULATORY_CARE_PROVIDER_SITE_OTHER): Payer: Self-pay

## 2024-02-08 DIAGNOSIS — L98491 Non-pressure chronic ulcer of skin of other sites limited to breakdown of skin: Secondary | ICD-10-CM | POA: Diagnosis not present

## 2024-02-11 ENCOUNTER — Encounter: Payer: Self-pay | Admitting: Internal Medicine

## 2024-02-11 ENCOUNTER — Ambulatory Visit: Payer: Medicare Other | Admitting: Internal Medicine

## 2024-02-11 ENCOUNTER — Ambulatory Visit (INDEPENDENT_AMBULATORY_CARE_PROVIDER_SITE_OTHER): Admitting: Internal Medicine

## 2024-02-11 VITALS — BP 120/74 | HR 95 | Ht 65.0 in | Wt 147.8 lb

## 2024-02-11 DIAGNOSIS — M7989 Other specified soft tissue disorders: Secondary | ICD-10-CM | POA: Diagnosis not present

## 2024-02-11 DIAGNOSIS — G894 Chronic pain syndrome: Secondary | ICD-10-CM | POA: Diagnosis not present

## 2024-02-11 DIAGNOSIS — F900 Attention-deficit hyperactivity disorder, predominantly inattentive type: Secondary | ICD-10-CM | POA: Diagnosis not present

## 2024-02-11 DIAGNOSIS — L03116 Cellulitis of left lower limb: Secondary | ICD-10-CM

## 2024-02-11 DIAGNOSIS — M5416 Radiculopathy, lumbar region: Secondary | ICD-10-CM | POA: Diagnosis not present

## 2024-02-11 DIAGNOSIS — F5101 Primary insomnia: Secondary | ICD-10-CM

## 2024-02-11 MED ORDER — OXYCODONE-ACETAMINOPHEN 5-325 MG PO TABS
1.0000 | ORAL_TABLET | Freq: Four times a day (QID) | ORAL | 0 refills | Status: DC | PRN
Start: 2024-02-11 — End: 2024-05-05

## 2024-02-11 MED ORDER — CEFDINIR 300 MG PO CAPS: 300.0000 mg | ORAL_CAPSULE | Freq: Two times a day (BID) | ORAL | 0 refills | Status: DC

## 2024-02-11 MED ORDER — NALOXONE HCL 0.4 MG/ML IJ SOLN: INTRAMUSCULAR | 3 refills | Status: AC

## 2024-02-11 NOTE — Assessment & Plan Note (Signed)
 Stable ,managed for years by Dr. Toi Foster

## 2024-02-11 NOTE — Progress Notes (Unsigned)
 Subjective:  Patient ID: Madeline Young, female    DOB: 1955-11-07  Age: 68 y.o. MRN: [id number]  CC: There were no encounter diagnoses.   HPI Madeline Young presents for  Chief Complaint  Patient presents with   Medical Management of Chronic Issues    Treated march 27 by ER  for cellulitis (4 days after last OV) :  she was prescribed an additional week of doxycycline  and Keflex  together as she did report some mild improvement while taking these. Also provided 5-day course of Lasix  for lower extremity swelling. Highly encouraged to avoid adhesives completely and patient was provided wound care supplies including nonadhesive gauze pads, absorbent gauze, and an Ace wrap to keep the bandage in place.   Dr Lindley Rhein gave her an IV abx  prior to her steroid injection .   Leg swelling improved after prednisone    Has seen dr Naviera for back pain /failed back syndrome.  Has had an ESI,  April 1 thoracic and April 15   follow up noted transient relief.  Waiting for insurance to approve nerve block   She has bilateral venus insufficiency  .    Going to [place] in June for 1 week with a friend.     Chronic pain/chronic insomnia.  She has a long history of insomnia  managed with ambien  for over 20 years  has tried many otc and non benzo medications .  Using alprazolam  prescribed by psychiatrist afer many treatment failures.   Outpatient Medications Prior to Visit  Medication Sig Dispense Refill   acetaminophen  (TYLENOL ) 500 MG tablet Take 1,000 mg by mouth every 6 (six) hours as needed.     ALPRAZolam  (XANAX ) 0.25 MG tablet Take 1 tablet (0.25 mg total) by mouth at bedtime as needed for anxiety. 30 tablet 2   aspirin 81 MG tablet Take 81 mg by mouth daily.     b complex vitamins capsule Take 1 capsule by mouth daily.     calcium  carbonate (OS-CAL - DOSED IN MG OF ELEMENTAL CALCIUM ) 1250 (500 Ca) MG tablet      celecoxib  (CELEBREX ) 200 MG capsule TAKE 1 CAPSULE BY MOUTH TWICE A DAY 180  capsule 1   cholecalciferol (VITAMIN D ) 1000 UNITS tablet Take 2,000 Units by mouth daily.     clobetasol (TEMOVATE) 0.05 % external solution as needed.     cyanocobalamin  (VITAMIN B12) 1000 MCG tablet Take 1,000 mcg by mouth daily.     diclofenac  Sodium (VOLTAREN ) 1 % GEL APPLY 2 GRAMS TO AFFECTED AREA 4 TIMES A DAY 300 g 3   DULoxetine  (CYMBALTA ) 60 MG capsule Take 2 capsules (120 mg total) by mouth daily. 180 capsule 0   fluocinonide ointment (LIDEX) 0.05 % APPLY TWICE DAILY TO AFFECTED AREAS UNTIL IMPROVED THEN AS NEEDED FOR FLARES     furosemide  (LASIX ) 20 MG tablet Take 1 tablet (20 mg total) by mouth daily. 7 tablet 0   leflunomide (ARAVA) 20 MG tablet Take 20 mg by mouth daily.     lisdexamfetamine (VYVANSE ) 70 MG capsule Take 1 capsule (70 mg total) by mouth daily. 30 capsule 0   [START ON 03/05/2024] lisdexamfetamine (VYVANSE ) 70 MG capsule Take 1 capsule (70 mg total) by mouth daily. 30 capsule 0   lisdexamfetamine (VYVANSE ) 70 MG capsule Take 1 capsule (70 mg total) by mouth daily. 30 capsule 0   Magnesium 250 MG CAPS Take by mouth. Take two by mouth daily     melatonin 5 MG TABS  Take 5 mg by mouth at bedtime.     Multiple Vitamin (MULTIVITAMIN) tablet Take 1 tablet by mouth daily.     mupirocin  ointment (BACTROBAN ) 2 % Apply 1 Application topically 2 (two) times daily. 22 g 0   nystatin -triamcinolone  ointment (MYCOLOG) Apply 1 application topically 2 (two) times daily. 30 g 1   Omega-3 Fatty Acids (FISH OIL) 1200 MG CAPS Take 1,200 mg by mouth daily.      oxyCODONE -acetaminophen  (PERCOCET/ROXICET) 5-325 MG tablet Take 1 tablet by mouth every 6 (six) hours as needed for moderate pain (pain score 4-6). Do not refill less than 30 days from prior refill 120 tablet 0   Polyethyl Glycol-Propyl Glycol 0.4-0.3 % SOLN Place 1 drop into both eyes daily as needed (for dry eyes).     Probiotic Product (PROBIOTIC DAILY PO) Take 1 capsule by mouth daily.     solifenacin  (VESICARE ) 10 MG tablet  TAKE 1 TABLET BY MOUTH EVERY DAY 90 tablet 1   triamcinolone  cream (KENALOG ) 0.1 % APPLY TO AFFECTED AREA TWICE A DAY UNTIL CLEAR THEN AS NEEDED     trospium  (SANCTURA ) 20 MG tablet Take 20 mg by mouth 2 (two) times daily.     Facility-Administered Medications Prior to Visit  Medication Dose Route Frequency Provider Last Rate Last Admin   mupirocin  cream (BACTROBAN ) 2 %   Topical BID         Review of Systems;  Patient denies headache, fevers, malaise, unintentional weight loss, skin rash, eye pain, sinus congestion and sinus pain, sore throat, dysphagia,  hemoptysis , cough, dyspnea, wheezing, chest pain, palpitations, orthopnea, edema, abdominal pain, nausea, melena, diarrhea, constipation, flank pain, dysuria, hematuria, urinary  Frequency, nocturia, numbness, tingling, seizures,  Focal weakness, Loss of consciousness,  Tremor, insomnia, depression, anxiety, and suicidal ideation.      Objective:  BP 120/74   Pulse 95   Ht 5\' 5"  (1.651 m)   Wt 147 lb 12.8 oz (67 kg)   SpO2 96%   BMI 24.60 kg/m   BP Readings from Last 3 Encounters:  02/11/24 120/74  01/22/24 126/80  01/08/24 (!) 165/84    Wt Readings from Last 3 Encounters:  02/11/24 147 lb 12.8 oz (67 kg)  01/22/24 145 lb (65.8 kg)  01/08/24 145 lb (65.8 kg)    Physical Exam  Lab Results  Component Value Date   HGBA1C 6.1 01/31/2022    Lab Results  Component Value Date   CREATININE 0.75 11/13/2023   CREATININE 0.75 05/16/2023   CREATININE 0.83 11/09/2022    Lab Results  Component Value Date   WBC 5.0 12/17/2023   HGB 11.1 (L) 12/17/2023   HCT 33.7 (L) 12/17/2023   PLT 310.0 12/17/2023   GLUCOSE 96 11/13/2023   CHOL 365 (H) 11/09/2022   TRIG 81.0 11/09/2022   HDL 71.40 11/09/2022   LDLDIRECT 256.0 11/09/2022   LDLCALC 277 (H) 11/09/2022   ALT 14 11/13/2023   AST 18 11/13/2023   NA 141 11/13/2023   K 4.5 11/13/2023   CL 103 11/13/2023   CREATININE 0.75 11/13/2023   BUN 25 (H) 11/13/2023   CO2 28  11/13/2023   TSH 0.77 11/13/2023   INR 1.0 02/07/2023   HGBA1C 6.1 01/31/2022   MICROALBUR 1.7 01/31/2022    MM 3D SCREENING MAMMOGRAM BILATERAL BREAST Result Date: 01/22/2024 CLINICAL DATA:  Screening. EXAM: DIGITAL SCREENING BILATERAL MAMMOGRAM WITH TOMOSYNTHESIS AND CAD TECHNIQUE: Bilateral screening digital craniocaudal and mediolateral oblique mammograms were obtained. Bilateral screening digital breast  tomosynthesis was performed. The images were evaluated with computer-aided detection. COMPARISON:  Previous exam(s). ACR Breast Density Category b: There are scattered areas of fibroglandular density. FINDINGS: There are no findings suspicious for malignancy. IMPRESSION: No mammographic evidence of malignancy. A result letter of this screening mammogram will be mailed directly to the patient. RECOMMENDATION: Screening mammogram in one year. (Code:SM-B-01Y) BI-RADS CATEGORY  1: Negative. Electronically Signed   By: Allena Ito M.D.   On: 01/22/2024 09:31    Assessment & Plan:  .There are no diagnoses linked to this encounter.   I spent 34 minutes on the day of this face to face encounter reviewing patient's  most recent visit with cardiology,  nephrology,  and neurology,  prior relevant surgical and non surgical procedures, recent  labs and imaging studies, counseling on weight management,  reviewing the assessment and plan with patient, and post visit ordering and reviewing of  diagnostics and therapeutics with patient  .   Follow-up: No follow-ups on file.   Thersia Flax, MD

## 2024-02-11 NOTE — Patient Instructions (Signed)
 Please separate your oxycodone  and alprazolam  doses by 2 hours and omit one or the other whenever it is feasible  I have prescribed narcan to use if you feel that you may have accidentally overdoses   Take the omnicef with you to [place] along with your compression socks

## 2024-02-11 NOTE — Assessment & Plan Note (Signed)
 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  Has been referred to Pain Management.  Reviewed the irks of oversedation with use of oxycodone  and alprazola prescribed by Dr Toi Foster.  Narcan  prescribed

## 2024-02-12 ENCOUNTER — Telehealth: Payer: Self-pay

## 2024-02-12 ENCOUNTER — Ambulatory Visit: Admitting: Internal Medicine

## 2024-02-12 NOTE — Assessment & Plan Note (Signed)
 Venous insufficiency appearance.  Has vascular follow up in the near future ,  ABIs were ordered by NP Cable  which were normal for arterial  circulation

## 2024-02-12 NOTE — Assessment & Plan Note (Signed)
 Resolved.  She was advised to avoid swimming on her vacation if wound recurs,  and was given an antibiotic rx to take with her.

## 2024-02-12 NOTE — Assessment & Plan Note (Signed)
 Alprazolam   dependent .  Prescribed along with stimulants by her psychiatrist/ . Multiple trials of other meds including ambien , melatonin, trazodone  and benadryl which were not tolerated due to side effects .  Narcan rx given

## 2024-02-12 NOTE — Telephone Encounter (Signed)
 [employer] denied payment for the LESI she had done 01/08/24. No prior auth was reqd on my end. She thinks we coded it wrong. She wants to see if we did our part right before she calls the billing office. Is there anyone who can check to make sure it was coded right?

## 2024-02-12 NOTE — Assessment & Plan Note (Signed)
 Managed with oxycodone  and recent ESI by Lindley Rhein.  Narcan rx given today given concurrent use of benzodiazepine (prescribed by her psychiatrist)  and advised to avoid taking meds concurrently

## 2024-02-15 LAB — VAS US ABI WITH/WO TBI
Left ABI: 1.19
Right ABI: 1.23

## 2024-02-18 ENCOUNTER — Telehealth (INDEPENDENT_AMBULATORY_CARE_PROVIDER_SITE_OTHER): Payer: Self-pay | Admitting: Nurse Practitioner

## 2024-02-18 NOTE — Telephone Encounter (Signed)
 Patient called and LVM on [facility] line to reschedule her New patient appointment. States she is out of town and will not be back in town in time to make it to her appointment. Patient states she has also never had Ultrasound. Advised pt that she had US  in our [facility] clinic on 02/08/24. Advised patient that provider will go over ABI US  results with her at provider appointment. US  & Consult appointments were scheduled on different days due to urgency of appointment per referral. Patient very confused and upset stating that [facility] just put blood pressure cuffs on her and did not do US . Rescheduled appt with FB for Wednesday, 02/20/24.

## 2024-02-19 ENCOUNTER — Encounter (INDEPENDENT_AMBULATORY_CARE_PROVIDER_SITE_OTHER): Payer: Self-pay | Admitting: Nurse Practitioner

## 2024-02-20 ENCOUNTER — Encounter (INDEPENDENT_AMBULATORY_CARE_PROVIDER_SITE_OTHER): Payer: Self-pay | Admitting: Nurse Practitioner

## 2024-02-20 ENCOUNTER — Ambulatory Visit (INDEPENDENT_AMBULATORY_CARE_PROVIDER_SITE_OTHER): Payer: Self-pay | Admitting: Nurse Practitioner

## 2024-02-20 VITALS — BP 137/70 | HR 85 | Resp 16 | Ht 66.0 in | Wt 145.4 lb

## 2024-02-20 DIAGNOSIS — R600 Localized edema: Secondary | ICD-10-CM | POA: Diagnosis not present

## 2024-02-20 DIAGNOSIS — L98491 Non-pressure chronic ulcer of skin of other sites limited to breakdown of skin: Secondary | ICD-10-CM | POA: Diagnosis not present

## 2024-02-25 ENCOUNTER — Encounter (INDEPENDENT_AMBULATORY_CARE_PROVIDER_SITE_OTHER): Payer: Self-pay | Admitting: Nurse Practitioner

## 2024-02-25 ENCOUNTER — Other Ambulatory Visit (INDEPENDENT_AMBULATORY_CARE_PROVIDER_SITE_OTHER): Payer: Self-pay | Admitting: Nurse Practitioner

## 2024-02-25 DIAGNOSIS — R600 Localized edema: Secondary | ICD-10-CM

## 2024-02-25 NOTE — Progress Notes (Signed)
 Subjective:    Patient ID: Madeline Young, female    DOB: Nov 02, 1955, 68 y.o.   MRN: [id number] Chief Complaint  Patient presents with   New Patient (Initial Visit)    Ref Tivis Forster consult skin ulcer/le edema     The patient is a 68 year old female who presents today for evaluation of swelling of her lower extremities.  Additionally she had a wound on her left lower extremity that was very slow to heal.  The wound has essentially healed at this time but there is some smaller wounds that are present today as well.  Prior to these issues the patient had swelling in her bilateral lower extremities.  She has tried to wear compression but notes that it is difficult for her to take on a put off due to her significant arthritis.  Given her slow healing wound the patient underwent ABIs which showed ABI 1.23 on the right and 1.19 on the left.  She has strong triphasic tibial artery waveforms bilaterally with normal toe waveforms bilaterally.    Review of Systems  Cardiovascular:  Positive for leg swelling.  Skin:  Positive for wound.  All other systems reviewed and are negative.      Objective:    Physical Exam Vitals reviewed.  HENT:     Head: Normocephalic.  Cardiovascular:     Rate and Rhythm: Normal rate.     Pulses: Normal pulses.  Pulmonary:     Effort: Pulmonary effort is normal.  Musculoskeletal:     Right lower leg: Edema present.     Left lower leg: Edema present.  Skin:    General: Skin is warm and dry.  Neurological:     Mental Status: She is alert and oriented to person, place, and time.  Psychiatric:        Mood and Affect: Mood normal.        Behavior: Behavior normal.        Thought Content: Thought content normal.        Judgment: Judgment normal.     BP 137/70   Pulse 85   Resp 16   Ht 5\' 6"  (1.676 m)   Wt 145 lb 6.4 oz (66 kg)   BMI 23.47 kg/m   Past Medical History:  Diagnosis Date   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   ADHD (attention deficit  hyperactivity disorder)    Allergy    Anxiety    Arthritis    Cataract    bil cateracts removed   Complication of anesthesia    first colonoscopy pt states she woke up   Depression    GERD (gastroesophageal reflux disease)    Globus sensation    Hyperlipidemia    Incontinence    bowels at times   Insomnia    Multinodular goiter (nontoxic)    Psoriatic arthritis (HCC)    Sjogren's syndrome (HCC) 11/2023   secondary   Spinal stenosis of lumbar region 03/10/2015   MRI    Statin intolerance 01/27/2013    Social History   Socioeconomic History   Marital status: Married    Spouse name: Not on file   Number of children: 2   Years of education: Not on file   Highest education level: Not on file  Occupational History   Occupation: disability  Tobacco Use   Smoking status: Former    Current packs/day: 0.00    Types: Cigarettes    Quit date: 08/21/1971    Years since quitting: 52.5  Passive exposure: Never   Smokeless tobacco: Never  Vaping Use   Vaping status: Never Used  Substance and Sexual Activity   Alcohol  use: Yes    Alcohol /week: 7.0 standard drinks of alcohol     Types: 7 Glasses of wine per week    Comment: daily,wine   Drug use: No   Sexual activity: Not on file  Other Topics Concern   Not on file  Social History Narrative   Married, retired Charity fundraiser   2 daughters some grandchildren   1 caffeinated drinks daily   1 alcoholic beverage daily   No tobacco      Social Drivers of Corporate investment banker Strain: Low Risk  (08/20/2023)   Overall Financial Resource Strain (CARDIA)    Difficulty of Paying Living Expenses: Not hard at all  Food Insecurity: No Food Insecurity (08/20/2023)   Hunger Vital Sign    Worried About Running Out of Food in the Last Year: Never true    Ran Out of Food in the Last Year: Never true  Transportation Needs: No Transportation Needs (08/20/2023)   PRAPARE - Administrator, Civil Service (Medical): No    Lack of  Transportation (Non-Medical): No  Physical Activity: Inactive (08/20/2023)   Exercise Vital Sign    Days of Exercise per Week: 0 days    Minutes of Exercise per Session: 0 min  Stress: Stress Concern Present (08/20/2023)   [employer] of Occupational Health - Occupational Stress Questionnaire    Feeling of Stress : Very much  Social Connections: Moderately Integrated (08/20/2023)   Social Connection and Isolation Panel [NHANES]    Frequency of Communication with Friends and Family: More than three times a week    Frequency of Social Gatherings with Friends and Family: Twice a week    Attends Religious Services: Never    Database administrator or Organizations: Yes    Attends Engineer, structural: More than 4 times per year    Marital Status: Married  Catering manager Violence: Not At Risk (01/03/2024)   Received from [facility]   HITS    Over the last 12 months how often did your partner physically hurt you?: Never    Over the last 12 months how often did your partner insult you or talk down to you?: Never    Over the last 12 months how often did your partner threaten you with physical harm?: Never    Over the last 12 months how often did your partner scream or curse at you?: Never    Past Surgical History:  Procedure Laterality Date   BACK SURGERY  02/17/2016   Dr. Sulema Endo- L4-L5 fusion    BIOPSY THYROID   2014   COLONOSCOPY     COLONOSCOPY W/ POLYPECTOMY     EYE SURGERY     bilateral cataract surgery w/ lens implant   FOOT SURGERY  2006   Right foot , secondary to severe loss of joint ([place])   POLYPECTOMY     TONSILLECTOMY     UPPER GASTROINTESTINAL ENDOSCOPY  2005   With empiric esophageal dilation    Family History  Problem Relation Age of Onset   Heart disease Mother    Hyperlipidemia Mother    Coronary artery disease Father 47       CABG in early 71's   Hyperlipidemia Father    Epilepsy Grandchild        Severe form   Colon cancer Neg Hx  Esophageal cancer Neg Hx    Rectal cancer Neg Hx    Stomach cancer Neg Hx    Colon polyps Neg Hx    Crohn's disease Neg Hx     Allergies  Allergen Reactions   Cosentyx [Secukinumab] Rash    Severe case of psorasis   Sertraline Hcl Other (See Comments)    Made me crazy    Adalimumab     Other Reaction(s): psoriasis   Avelox [Moxifloxacin Hcl In Nacl]     Pt cant remember reaction   Nitrofurantoin     Other reaction(s): Other (See Comments) tired Pt cant remember reaction Other reaction(s): Unknown   Pregabalin      Cognitive issues   Rosuvastatin     Other reaction(s): Unknown   Latex Itching and Rash    Redness   Macrobid [Nitrofurantoin Monohyd Macro] Other (See Comments)    tired   Statins Other (See Comments)    Muscle aches       Latest Ref Rng & Units 12/17/2023    3:06 PM 05/16/2023    2:34 PM 02/07/2023    2:43 PM  CBC  WBC 4.0 - 10.5 K/uL 5.0  6.6  8.4   Hemoglobin 12.0 - 15.0 g/dL 16.1  09.6  04.5   Hematocrit 36.0 - 46.0 % 33.7  34.7  37.0   Platelets 150.0 - 400.0 K/uL 310.0  326.0  300.0        CMP     Component Value Date/Time   NA 141 11/13/2023 1506   K 4.5 11/13/2023 1506   CL 103 11/13/2023 1506   CO2 28 11/13/2023 1506   GLUCOSE 96 11/13/2023 1506   BUN 25 (H) 11/13/2023 1506   CREATININE 0.75 11/13/2023 1506   CALCIUM  9.2 11/13/2023 1506   PROT 6.4 11/13/2023 1506   ALBUMIN 3.8 11/13/2023 1506   AST 18 11/13/2023 1506   ALT 14 11/13/2023 1506   ALKPHOS 57 11/13/2023 1506   BILITOT 0.4 11/13/2023 1506   GFR 81.33 11/13/2023 1506   GFRNONAA >60 02/18/2016 0322     VAS US  ABI WITH/WO TBI Result Date: 02/15/2024  LOWER EXTREMITY DOPPLER STUDY Patient Name:  CLEMENTINA MARENO  Date of Exam:   02/08/2024 Medical Rec #: [id number]           Accession #:    [id number] Date of Birth: 07/23/1956           Patient Gender: F Patient Age:   22 years Exam Location:  [facility] Procedure:      VAS US  ABI WITH/WO TBI Referring Phys:  Sharla Davis --------------------------------------------------------------------------------  Indications: Ulceration. High Risk         Hyperlipidemia, past history of smoking, coronary artery Factors:          disease.  Performing Technologist: Oneta Bilberry RVT  Examination Guidelines: A complete evaluation includes at minimum, Doppler waveform signals and systolic blood pressure reading at the level of bilateral brachial, anterior tibial, and posterior tibial arteries, when vessel segments are accessible. Bilateral testing is considered an integral part of a complete examination. Photoelectric Plethysmograph (PPG) waveforms and toe systolic pressure readings are included as required and additional duplex testing as needed. Limited examinations for reoccurring indications may be performed as noted.  ABI Findings: +---------+------------------+-----+---------+--------+ Right    Rt Pressure (mmHg)IndexWaveform Comment  +---------+------------------+-----+---------+--------+ Brachial 147                                      +---------+------------------+-----+---------+--------+  PTA      165               1.09 triphasic         +---------+------------------+-----+---------+--------+ DP       186               1.23 triphasic         +---------+------------------+-----+---------+--------+ Great [id number]               1.27                   +---------+------------------+-----+---------+--------+ +---------+------------------+-----+---------+-------+ Left     Lt Pressure (mmHg)IndexWaveform Comment +---------+------------------+-----+---------+-------+ Brachial 151                                     +---------+------------------+-----+---------+-------+ PTA      179               1.19 triphasic        +---------+------------------+-----+---------+-------+ DP       174               1.15 triphasic        +---------+------------------+-----+---------+-------+ Great  Toe158               1.05                  +---------+------------------+-----+---------+-------+ +-------+-----------+-----------+------------+------------+ ABI/TBIToday's ABIToday's TBIPrevious ABIPrevious TBI +-------+-----------+-----------+------------+------------+ Right  1.23       1.27                                +-------+-----------+-----------+------------+------------+ Left   1.19       1.05                                +-------+-----------+-----------+------------+------------+  Summary: Right: Resting right ankle-brachial index is within normal range. The right toe-brachial index is normal. Left: Resting left ankle-brachial index is within normal range. The left toe-brachial index is normal. *See table(s) above for measurements and observations.  Electronically signed by Mikki Alexander MD on 02/15/2024 at 11:12:37 AM.    Final        Assessment & Plan:   1. Skin ulcer, limited to breakdown of skin (HCC) (Primary) Wounds are primarily healed based on her noninvasive studies she has adequate perfusion for wound healing.  She will follow-up with noninvasive studies as noted below.  2. Lower extremity edema Recommend:  I have had a long discussion with the patient regarding swelling and why it  causes symptoms.  Patient will begin wearing graduated compression on a daily basis a prescription was given. The patient will  wear the stockings first thing in the morning and removing them in the evening. The patient is instructed specifically not to sleep in the stockings.   In addition, behavioral modification will be initiated.  This will include frequent elevation, use of over the counter pain medications and exercise such as walking.  Consideration for a lymph pump will also be made based upon the effectiveness of conservative therapy.  This would help to improve the edema control and prevent sequela such as ulcers and infections   Patient should undergo duplex  ultrasound of the venous system to ensure that DVT or reflux is not present.  The patient will follow-up  with me after the ultrasound.    Current Outpatient Medications on File Prior to Visit  Medication Sig Dispense Refill   acetaminophen  (TYLENOL ) 500 MG tablet Take 1,000 mg by mouth every 6 (six) hours as needed.     ALPRAZolam  (XANAX ) 0.25 MG tablet Take 1 tablet (0.25 mg total) by mouth at bedtime as needed for anxiety. 30 tablet 2   aspirin 81 MG tablet Take 81 mg by mouth daily.     b complex vitamins capsule Take 1 capsule by mouth daily.     calcium  carbonate (OS-CAL - DOSED IN MG OF ELEMENTAL CALCIUM ) 1250 (500 Ca) MG tablet      cefdinir  (OMNICEF ) 300 MG capsule Take 1 capsule (300 mg total) by mouth 2 (two) times daily. 14 capsule 0   celecoxib  (CELEBREX ) 200 MG capsule TAKE 1 CAPSULE BY MOUTH TWICE A DAY 180 capsule 1   cholecalciferol (VITAMIN D ) 1000 UNITS tablet Take 2,000 Units by mouth daily.     clobetasol (TEMOVATE) 0.05 % external solution as needed.     cyanocobalamin  (VITAMIN B12) 1000 MCG tablet Take 1,000 mcg by mouth daily.     diclofenac  Sodium (VOLTAREN ) 1 % GEL APPLY 2 GRAMS TO AFFECTED AREA 4 TIMES A DAY 300 g 3   DULoxetine  (CYMBALTA ) 60 MG capsule Take 2 capsules (120 mg total) by mouth daily. 180 capsule 0   fluocinonide ointment (LIDEX) 0.05 % APPLY TWICE DAILY TO AFFECTED AREAS UNTIL IMPROVED THEN AS NEEDED FOR FLARES     furosemide  (LASIX ) 20 MG tablet Take 1 tablet (20 mg total) by mouth daily. 7 tablet 0   leflunomide (ARAVA) 20 MG tablet Take 20 mg by mouth daily.     lisdexamfetamine (VYVANSE ) 70 MG capsule Take 1 capsule (70 mg total) by mouth daily. 30 capsule 0   [START ON 03/05/2024] lisdexamfetamine (VYVANSE ) 70 MG capsule Take 1 capsule (70 mg total) by mouth daily. 30 capsule 0   lisdexamfetamine (VYVANSE ) 70 MG capsule Take 1 capsule (70 mg total) by mouth daily. 30 capsule 0   Magnesium 250 MG CAPS Take by mouth. Take two by mouth daily      melatonin 5 MG TABS Take 5 mg by mouth at bedtime.     Multiple Vitamin (MULTIVITAMIN) tablet Take 1 tablet by mouth daily.     mupirocin  ointment (BACTROBAN ) 2 % Apply 1 Application topically 2 (two) times daily. 22 g 0   naloxone  (NARCAN ) 0.4 MG/ML injection Take as needed for oversedation 1 mL 3   nystatin -triamcinolone  ointment (MYCOLOG) Apply 1 application topically 2 (two) times daily. 30 g 1   Omega-3 Fatty Acids (FISH OIL) 1200 MG CAPS Take 1,200 mg by mouth daily.      oxyCODONE -acetaminophen  (PERCOCET/ROXICET) 5-325 MG tablet Take 1 tablet by mouth every 6 (six) hours as needed for moderate pain (pain score 4-6). Do not refill less than 30 days from prior refill 120 tablet 0   Polyethyl Glycol-Propyl Glycol 0.4-0.3 % SOLN Place 1 drop into both eyes daily as needed (for dry eyes).     Probiotic Product (PROBIOTIC DAILY PO) Take 1 capsule by mouth daily.     solifenacin  (VESICARE ) 10 MG tablet TAKE 1 TABLET BY MOUTH EVERY DAY 90 tablet 1   triamcinolone  cream (KENALOG ) 0.1 % APPLY TO AFFECTED AREA TWICE A DAY UNTIL CLEAR THEN AS NEEDED     trospium  (SANCTURA ) 20 MG tablet Take 20 mg by mouth 2 (two) times daily.     Current  Facility-Administered Medications on File Prior to Visit  Medication Dose Route Frequency Provider Last Rate Last Admin   mupirocin  cream (BACTROBAN ) 2 %   Topical BID         There are no Patient Instructions on file for this visit. No follow-ups on file.   Niki Payment E Dewell Monnier, NP

## 2024-02-26 ENCOUNTER — Encounter: Payer: Self-pay | Admitting: Pain Medicine

## 2024-02-26 ENCOUNTER — Ambulatory Visit
Admission: RE | Admit: 2024-02-26 | Discharge: 2024-02-26 | Disposition: A | Discharge status: Home / Self Care | Source: Ambulatory Visit | Attending: Pain Medicine | Admitting: Pain Medicine

## 2024-02-26 ENCOUNTER — Ambulatory Visit: Attending: Pain Medicine | Admitting: Pain Medicine

## 2024-02-26 ENCOUNTER — Encounter (INDEPENDENT_AMBULATORY_CARE_PROVIDER_SITE_OTHER): Payer: Self-pay

## 2024-02-26 VITALS — BP 161/82 | HR 81 | Temp 97.2°F | Resp 12 | Ht 65.0 in | Wt 145.0 lb

## 2024-02-26 DIAGNOSIS — Z91040 Latex allergy status: Secondary | ICD-10-CM

## 2024-02-26 DIAGNOSIS — G8929 Other chronic pain: Secondary | ICD-10-CM

## 2024-02-26 DIAGNOSIS — M5459 Other low back pain: Secondary | ICD-10-CM | POA: Diagnosis present

## 2024-02-26 DIAGNOSIS — M47816 Spondylosis without myelopathy or radiculopathy, lumbar region: Secondary | ICD-10-CM

## 2024-02-26 DIAGNOSIS — M4316 Spondylolisthesis, lumbar region: Secondary | ICD-10-CM | POA: Diagnosis present

## 2024-02-26 DIAGNOSIS — M961 Postlaminectomy syndrome, not elsewhere classified: Secondary | ICD-10-CM

## 2024-02-26 DIAGNOSIS — M545 Low back pain, unspecified: Secondary | ICD-10-CM

## 2024-02-26 DIAGNOSIS — Z9189 Other specified personal risk factors, not elsewhere classified: Secondary | ICD-10-CM | POA: Diagnosis present

## 2024-02-26 DIAGNOSIS — M47817 Spondylosis without myelopathy or radiculopathy, lumbosacral region: Secondary | ICD-10-CM | POA: Diagnosis present

## 2024-02-26 DIAGNOSIS — R937 Abnormal findings on diagnostic imaging of other parts of musculoskeletal system: Secondary | ICD-10-CM

## 2024-02-26 DIAGNOSIS — T85848S Pain due to other internal prosthetic devices, implants and grafts, sequela: Secondary | ICD-10-CM | POA: Diagnosis present

## 2024-02-26 DIAGNOSIS — M79604 Pain in right leg: Secondary | ICD-10-CM | POA: Insufficient documentation

## 2024-02-26 DIAGNOSIS — M79605 Pain in left leg: Secondary | ICD-10-CM

## 2024-02-26 MED ORDER — TRIAMCINOLONE ACETONIDE 40 MG/ML IJ SUSP
80.0000 mg | Freq: Once | INTRAMUSCULAR | Status: AC
  Administered 2024-02-26: 80 mg

## 2024-02-26 MED ORDER — MIDAZOLAM HCL 5 MG/5ML IJ SOLN
INTRAMUSCULAR | Status: AC
  Filled 2024-02-26: qty 5

## 2024-02-26 MED ORDER — ROPIVACAINE HCL 2 MG/ML IJ SOLN
18.0000 mL | Freq: Once | INTRAMUSCULAR | Status: AC
  Administered 2024-02-26: 18 mL via PERINEURAL

## 2024-02-26 MED ORDER — MIDAZOLAM HCL 5 MG/5ML IJ SOLN
0.5000 mg | Freq: Once | INTRAMUSCULAR | Status: AC
  Administered 2024-02-26: 3 mg via INTRAVENOUS

## 2024-02-26 MED ORDER — PENTAFLUOROPROP-TETRAFLUOROETH EX AERO
INHALATION_SPRAY | Freq: Once | CUTANEOUS | Status: AC
Start: 2024-02-26 — End: 2024-02-26
  Administered 2024-02-26: 30 via TOPICAL

## 2024-02-26 MED ORDER — LIDOCAINE HCL 2 % IJ SOLN
20.0000 mL | Freq: Once | INTRAMUSCULAR | Status: AC
Start: 2024-02-26 — End: 2024-02-26
  Administered 2024-02-26: 400 mg

## 2024-02-26 MED ORDER — LIDOCAINE HCL 2 % IJ SOLN
INTRAMUSCULAR | Status: AC
  Filled 2024-02-26: qty 20

## 2024-02-26 MED ORDER — ROPIVACAINE HCL 2 MG/ML IJ SOLN
INTRAMUSCULAR | Status: AC
  Filled 2024-02-26: qty 20

## 2024-02-26 MED ORDER — TRIAMCINOLONE ACETONIDE 40 MG/ML IJ SUSP
INTRAMUSCULAR | Status: AC
  Filled 2024-02-26: qty 2

## 2024-02-26 MED ORDER — FENTANYL CITRATE (PF) 100 MCG/2ML IJ SOLN
25.0000 ug | INTRAMUSCULAR | Status: DC | PRN
  Administered 2024-02-26: 50 ug via INTRAVENOUS

## 2024-02-26 MED ORDER — FENTANYL CITRATE (PF) 100 MCG/2ML IJ SOLN
INTRAMUSCULAR | Status: AC
  Filled 2024-02-26: qty 2

## 2024-02-26 NOTE — Progress Notes (Signed)
 Safety precautions to be maintained throughout the outpatient stay will include: orient to surroundings, keep bed in low position, maintain call bell within reach at all times, provide assistance with transfer out of bed and ambulation.

## 2024-02-26 NOTE — Progress Notes (Signed)
 PROVIDER NOTE: Interpretation of information contained herein should be left to medically-trained personnel. Specific patient instructions are provided elsewhere under "Patient Instructions" section of medical record. This document was created in part using STT-dictation technology, any transcriptional errors that may result from this process are unintentional.  Patient: Madeline Young Type: Established DOB: [birth year] MRN: [id number] PCP: Thersia Flax, MD  Service: Procedure DOS: 02/26/2024 Setting: Ambulatory Location: Ambulatory outpatient facility Delivery: Face-to-face Provider: Candi Chafe, MD Specialty: Interventional Pain Management Specialty designation: 09 Location: Outpatient facility Ref. Prov.: Renaldo Caroli, MD       Interventional Therapy   Type: Lumbar Fusion (Pedicle screw) Hardware Block (Medial Branch Block)   #1  Laterality: Bilateral  Level: L2, L3, L4, L5, and S1 Medial Branch Level(s). Injecting these levels blocks the L3-4, L4-5, and L5-S1 lumbar facet joints.  Imaging: Fluoroscopic guidance Spinal ([id number]) Anesthesia: Local anesthesia (1-2% Lidocaine ) Anxiolysis: IV Versed  3.0 mg Sedation: Moderate Sedation Fentanyl  1 mL (50 mcg) DOS: 02/26/2024 Performed by: Candi Chafe, MD  Primary Purpose: Diagnostic/Therapeutic Indications: Low back pain severe enough to impact quality of life or function. 1. Chronic low back pain (1ry area of Pain) (Bilateral) (R>L) w/o sciatica   2. Lumbar facet joint pain   3. Lumbar facet Arthropathy   4. Pain from implanted hardware, sequela   5. Spondylolisthesis of lumbar region   6. Spondylosis without myelopathy or radiculopathy, lumbosacral region   7. Low back pain of over 3 months duration   8. Abnormal MRI, lumbar spine (04/19/2023) ([facility])   9. Abnormal CT myelogram of lumbar spine (02/15/2023)   10. Latex precautions, history of latex allergy   11. At high risk for allergic reaction to latex    12. 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 Low back pain radiating to legs (Bilateral)    NAS-11 Pain score:   Pre-procedure: 6 /10   Post-procedure: 0-No pain/10     Position / Prep / Materials:  Position: Prone  Prep solution: ChloraPrep (2% chlorhexidine  gluconate and 70% isopropyl alcohol ) Area Prepped: Posterolateral Lumbosacral Spine (Wide prep: From the lower border of the scapula down to the end of the tailbone and from flank to flank.)  Materials:  Tray: Block Needle(s):  Type: Spinal  Gauge (G): 22  Length: 5-in Qty: 4     H&P (Pre-op Assessment):  Madeline Young is a 68 y.o. (year old), female patient, seen today for interventional treatment. She  has a past surgical history that includes Foot surgery (2006); Colonoscopy w/ polypectomy; Upper gastrointestinal endoscopy (2005); Tonsillectomy; Biopsy thyroid  (2014); Eye surgery; Back surgery (02/17/2016); Colonoscopy; and Polypectomy. Madeline Young has a current medication list which includes the following prescription(s): acetaminophen , alprazolam , aspirin, b complex vitamins, calcium  carbonate, cefdinir , celecoxib , cholecalciferol, clobetasol, cyanocobalamin , diclofenac  sodium, duloxetine , fluocinonide ointment, furosemide , leflunomide, lisdexamfetamine, [START ON 03/05/2024] lisdexamfetamine, lisdexamfetamine, magnesium, melatonin, multivitamin, mupirocin  ointment, naloxone , nystatin -triamcinolone  ointment, fish oil, oxycodone -acetaminophen , polyethyl glycol-propyl glycol, probiotic product, solifenacin , triamcinolone  cream, and trospium , and the following Facility-Administered Medications: fentanyl  and mupirocin  cream. Her primarily concern today is the Back Pain (lower)  Initial Vital Signs:  Pulse/HCG Rate: 81ECG Heart Rate: 76 (nsr) Temp: (!) 97.2 F (36.2 C) Resp: 17 BP: 114/81 SpO2: 100 %  BMI: Estimated body mass index is 24.13 kg/m as calculated from the following:   Height as of this encounter:  5\' 5"  (1.651 m).   Weight as of this encounter: 145 lb (65.8 kg).  Risk Assessment: Allergies: Reviewed. She is allergic to  cosentyx [secukinumab], sertraline hcl, adalimumab, avelox [moxifloxacin hcl in nacl], nitrofurantoin, pregabalin , rosuvastatin, latex, macrobid [nitrofurantoin monohyd macro], and statins.  Allergy Precautions: None required Coagulopathies: Reviewed. None identified.  Blood-thinner therapy: None at this time Active Infection(s): Reviewed. None identified. Madeline Young is afebrile  Site Confirmation: Madeline Young was asked to confirm the procedure and laterality before marking the site Procedure checklist: Completed Consent: Before the procedure and under the influence of no sedative(s), amnesic(s), or anxiolytics, the patient was informed of the treatment options, risks and possible complications. To fulfill our ethical and legal obligations, as recommended by the American Medical Association's Code of Ethics, I have informed the patient of my clinical impression; the nature and purpose of the treatment or procedure; the risks, benefits, and possible complications of the intervention; the alternatives, including doing nothing; the risk(s) and benefit(s) of the alternative treatment(s) or procedure(s); and the risk(s) and benefit(s) of doing nothing. The patient was provided information about the general risks and possible complications associated with the procedure. These may include, but are not limited to: failure to achieve desired goals, infection, bleeding, organ or nerve damage, allergic reactions, paralysis, and death. In addition, the patient was informed of those risks and complications associated to Spine-related procedures, such as failure to decrease pain; infection (i.e.: Meningitis, epidural or intraspinal abscess); bleeding (i.e.: epidural hematoma, subarachnoid hemorrhage, or any other type of intraspinal or peri-dural bleeding); organ or nerve damage (i.e.: Any type  of peripheral nerve, nerve root, or spinal cord injury) with subsequent damage to sensory, motor, and/or autonomic systems, resulting in permanent pain, numbness, and/or weakness of one or several areas of the body; allergic reactions; (i.e.: anaphylactic reaction); and/or death. Furthermore, the patient was informed of those risks and complications associated with the medications. These include, but are not limited to: allergic reactions (i.e.: anaphylactic or anaphylactoid reaction(s)); adrenal axis suppression; blood sugar elevation that in diabetics may result in ketoacidosis or comma; water retention that in patients with history of congestive heart failure may result in shortness of breath, pulmonary edema, and decompensation with resultant heart failure; weight gain; swelling or edema; medication-induced neural toxicity; particulate matter embolism and blood vessel occlusion with resultant organ, and/or nervous system infarction; and/or aseptic necrosis of one or more joints. Finally, the patient was informed that Medicine is not an exact science; therefore, there is also the possibility of unforeseen or unpredictable risks and/or possible complications that may result in a catastrophic outcome. The patient indicated having understood very clearly. We have given the patient no guarantees and we have made no promises. Enough time was given to the patient to ask questions, all of which were answered to the patient's satisfaction. Madeline Young has indicated that she wanted to continue with the procedure. Attestation: I, the ordering provider, attest that I have discussed with the patient the benefits, risks, side-effects, alternatives, likelihood of achieving goals, and potential problems during recovery for the procedure that I have provided informed consent. Date  Time: 02/26/2024  9:32 AM  Pre-Procedure Preparation:  Monitoring: As per clinic protocol. Respiration, ETCO2, SpO2, BP, heart rate and rhythm  monitor placed and checked for adequate function Safety Precautions: Patient was assessed for positional comfort and pressure points before starting the procedure. Time-out: I initiated and conducted the "Time-out" before starting the procedure, as per protocol. The patient was asked to participate by confirming the accuracy of the "Time Out" information. Verification of the correct person, site, and procedure were performed and confirmed by me, the nursing staff,  and the patient. "Time-out" conducted as per Joint Commission's Universal Protocol (UP.01.01.01). Time: 0958 Start Time: 0958 hrs.  Description of Procedure:          Laterality: (see above) Targeted Levels: (see above)  Safety Precautions: Aspiration looking for blood return was conducted prior to all injections. At no point did we inject any substances, as a needle was being advanced. Before injecting, the patient was told to immediately notify me if she was experiencing any new onset of "ringing in the ears, or metallic taste in the mouth". No attempts were made at seeking any paresthesias. Safe injection practices and needle disposal techniques used. Medications properly checked for expiration dates. SDV (single dose vial) medications used. After the completion of the procedure, all disposable equipment used was discarded in the proper designated medical waste containers. Local Anesthesia: Protocol guidelines were followed. The patient was positioned over the fluoroscopy table. The area was prepped in the usual manner. The time-out was completed. The target area was identified using fluoroscopy. A 12-in long, straight, sterile hemostat was used with fluoroscopic guidance to locate the targets for each level blocked. Once located, the skin was marked with an approved surgical skin marker. Once all sites were marked, the skin (epidermis, dermis, and hypodermis), as well as deeper tissues (fat, connective tissue and muscle) were infiltrated  with a small amount of a short-acting local anesthetic, loaded on a 10cc syringe with a 25G, 1.5-in  Needle. An appropriate amount of time was allowed for local anesthetics to take effect before proceeding to the next step. Local Anesthetic: Lidocaine  2.0% The unused portion of the local anesthetic was discarded in the proper designated containers. Technical description of process:  L2 Medial Branch Nerve Block (MBB): The target area for the L2 medial branch is at the junction of the postero-lateral aspect of the superior articular process and the superior, posterior, and medial edge of the transverse process of L3. Under fluoroscopic guidance, a Quincke needle was inserted until contact was made with os over the superior postero-lateral aspect of the pedicular shadow (target area). After negative aspiration for blood, 0.5 mL of the nerve block solution was injected without difficulty or complication. The needle was removed intact. L3 Medial Branch Nerve Block (MBB): The target area for the L3 medial branch is at the junction of the postero-lateral aspect of the superior articular process and the superior, posterior, and medial edge of the transverse process of L4. Under fluoroscopic guidance, a Quincke needle was inserted until contact was made with os over the superior postero-lateral aspect of the pedicular shadow (target area). After negative aspiration for blood, 0.5 mL of the nerve block solution was injected without difficulty or complication. The needle was removed intact. L4 Medial Branch Nerve Block (MBB): The target area for the L4 medial branch is at the junction of the postero-lateral aspect of the superior articular process and the superior, posterior, and medial edge of the transverse process of L5. Under fluoroscopic guidance, a Quincke needle was inserted until contact was made with os over the superior postero-lateral aspect of the pedicular shadow (target area). After negative aspiration for  blood, 0.5 mL of the nerve block solution was injected without difficulty or complication. The needle was removed intact. L5 Medial Branch Nerve Block (MBB): The target area for the L5 medial branch is at the junction of the postero-lateral aspect of the superior articular process and the superior, posterior, and medial edge of the sacral ala. Under fluoroscopic guidance, a Quincke needle  was inserted until contact was made with os over the superior postero-lateral aspect of the pedicular shadow (target area). After negative aspiration for blood, 0.5 mL of the nerve block solution was injected without difficulty or complication. The needle was removed intact. S1 Medial Branch Nerve Block (MBB): The target area for the S1 medial branch is at the posterior and inferior 6 o'clock position of the L5-S1 facet joint. Under fluoroscopic guidance, the Quincke needle inserted for the L5 MBB was redirected until contact was made with os over the inferior and postero aspect of the sacrum, at the 6 o' clock position under the L5-S1 facet joint (Target area). After negative aspiration for blood, 0.5 mL of the nerve block solution was injected without difficulty or complication. The needle was removed intact.  Once the entire procedure was completed, the treated area was cleaned, making sure to leave some of the prepping solution back to take advantage of its long term bactericidal properties.         Illustration of the posterior view of the lumbar spine and the posterior neural structures. Laminae of L2 through S1 are labeled. DPRL5, dorsal primary ramus of L5; DPRS1, dorsal primary ramus of S1; DPR3, dorsal primary ramus of L3; FJ, facet (zygapophyseal) joint L3-L4; I, inferior articular process of L4; LB1, lateral branch of dorsal primary ramus of L1; IAB, inferior articular branches from L3 medial branch (supplies L4-L5 facet joint); IBP, intermediate branch plexus; MB3, medial branch of dorsal primary ramus of L3;  NR3, third lumbar nerve root; S, superior articular process of L5; SAB, superior articular branches from L4 (supplies L4-5 facet joint also); TP3, transverse process of L3.   Facet Joint Innervation (* possible contribution)  L1-2 T12, L1 (L2*)  Medial Branch  L2-3 L1, L2 (L3*)         "          "  L3-4 L2, L3 (L4*)         "          "  L4-5 L3, L4 (L5*)         "          "  L5-S1 L4, L5, S1          "          "    Vitals:   02/26/24 1021 02/26/24 1030 02/26/24 1041 02/26/24 1042  BP: (!) 156/72 (!) 164/78 (!) 158/96 (!) 161/82  Pulse:      Resp: (!) 9 (!) 9 12   Temp: 98.4 F (36.9 C)  (!) 97.2 F (36.2 C)   TempSrc: Temporal  Temporal   SpO2: 97% 97% 100%   Weight:      Height:         End Time: 1012 hrs.  Imaging Guidance (Spinal):          Type of Imaging Technique: Fluoroscopy Guidance (Spinal) Indication(s): Fluoroscopy guidance for needle placement to enhance accuracy in procedures requiring precise needle localization for targeted delivery of medication in or near specific anatomical locations not easily accessible without such real-time imaging assistance. Exposure Time: Please see nurses notes. Contrast: None used. Fluoroscopic Guidance: I was personally present during the use of fluoroscopy. "Tunnel Vision Technique" used to obtain the best possible view of the target area. Parallax error corrected before commencing the procedure. "Direction-depth-direction" technique used to introduce the needle under continuous pulsed fluoroscopy. Once target was reached, antero-posterior, oblique, and lateral fluoroscopic projection used confirm needle  placement in all planes. Images permanently stored in EMR. Interpretation: No contrast injected. I personally interpreted the imaging intraoperatively. Adequate needle placement confirmed in multiple planes. Permanent images saved into the patient's record.  Post-operative Assessment:  Post-procedure Vital Signs:  Pulse/HCG Rate:  8171 Temp: (!) 97.2 F (36.2 C) Resp: 12 BP: (!) 161/82 SpO2: 100 %  EBL: None  Complications: No immediate post-treatment complications observed by team, or reported by patient.  Note: The patient tolerated the entire procedure well. A repeat set of vitals were taken after the procedure and the patient was kept under observation following institutional policy, for this type of procedure. Post-procedural neurological assessment was performed, showing return to baseline, prior to discharge. The patient was provided with post-procedure discharge instructions, including a section on how to identify potential problems. Should any problems arise concerning this procedure, the patient was given instructions to immediately contact us , at any time, without hesitation. In any case, we plan to contact the patient by telephone for a follow-up status report regarding this interventional procedure.  Comments:  No additional relevant information.  Plan of Care (POC)  Orders:  Orders Placed This Encounter  Procedures   LUMBAR FACET(MEDIAL BRANCH NERVE BLOCK) MBNB    Scheduling Instructions:     Procedure: Lumbar facet block (AKA.: Lumbosacral medial branch nerve block)     Side: Bilateral     Level: L3-4, L4-5, and L5-S1 Facets (L2, L3, L4, L5, and S1 Medial Branch Nerves)     Sedation: Patient's choice.     Timeframe: Today    Where will this procedure be performed?:   [facility]   DG PAIN [facility] C-ARM 1-60 MIN NO REPORT    Intraoperative interpretation by procedural physician at [facility].    Standing Status:   Standing    Number of Occurrences:   1    Reason for exam::   Assistance in needle guidance and placement for procedures requiring needle placement in or near specific anatomical locations not easily accessible without such assistance.   Informed Consent Details: Physician/Practitioner Attestation; Transcribe to consent form and obtain patient signature    Nursing  Order: Transcribe to consent form and obtain patient signature. Note: Always confirm laterality of pain with Madeline Young, before procedure.    Physician/Practitioner attestation of informed consent for procedure/surgical case:   I, the physician/practitioner, attest that I have discussed with the patient the benefits, risks, side effects, alternatives, likelihood of achieving goals and potential problems during recovery for the procedure that I have provided informed consent.    Procedure:   Lumbar Facet Block  under fluoroscopic guidance    Physician/Practitioner performing the procedure:   Kirby Cortese A. Barth Borne MD    Indication/Reason:   Low Back Pain, with our without leg pain, due to Facet Joint Arthralgia (Joint Pain) Spondylosis (Arthritis of the Spine), without myelopathy or radiculopathy (Nerve Damage).   Provide equipment / supplies at bedside    Procedure tray: "Block Tray" (Disposable  single use) Skin infiltration needle: Regular 1.5-in, 25-G, (x1) Block Needle type: Spinal Amount/quantity: 4 Size: Regular (3.5-inch) Gauge: 22G    Standing Status:   Standing    Number of Occurrences:   1    Specify:   Block Tray   Saline lock IV    Have LR [phone] mL available and administer at 125 mL/hr if patient becomes hypotensive.    Standing Status:   Standing    Number of Occurrences:   1   Latex precautions  Activate Latex-Free Protocol.    Standing Status:   Standing    Number of Occurrences:   1   Chronic Opioid Analgesic:  No chronic opioid analgesics therapy prescribed by our practice. oxycodone /APAP 5/325, 1 tab p.o. 4 times daily (120/month) (# 120) (last filled on 11/06/2023) MME/day: 30 mg/day   Medications ordered for procedure: Meds ordered this encounter  Medications   lidocaine  (XYLOCAINE ) 2 % (with pres) injection 400 mg   pentafluoroprop-tetrafluoroeth (GEBAUERS) aerosol   midazolam  (VERSED ) 5 MG/5ML injection 0.5-2 mg    Make sure Flumazenil is available in the  pyxis when using this medication. If oversedation occurs, administer 0.2 mg IV over 15 sec. If after 45 sec no response, administer 0.2 mg again over 1 min; may repeat at 1 min intervals; not to exceed 4 doses (1 mg)   fentaNYL  (SUBLIMAZE ) injection 25-50 mcg    Make sure Narcan  is available in the pyxis when using this medication. In the event of respiratory depression (RR< 8/min): Titrate NARCAN  (naloxone ) in increments of 0.1 to 0.2 mg IV at 2-3 minute intervals, until desired degree of reversal.   ropivacaine  (PF) 2 mg/mL (0.2%) (NAROPIN ) injection 18 mL   triamcinolone  acetonide (KENALOG -40) injection 80 mg   Medications administered: We administered lidocaine , pentafluoroprop-tetrafluoroeth, midazolam , fentaNYL , ropivacaine  (PF) 2 mg/mL (0.2%), and triamcinolone  acetonide.  See the medical record for exact dosing, route, and time of administration.  Follow-up plan:   Return in about 2 weeks (around 03/11/2024) for (Face2F), (PPE).       Interventional Therapies  Risk Factors  Considerations  Medical Comorbidities:    Poor candidate for RFA    Planned  Pending:   Diagnostic bilateral lumbar hardware/Facet block #1    Under consideration:   Diagnostic bilateral lumbar hardware/Facet block #1  Diagnostic caudal ESI + diagnostic epidurogram #1  Diagnostic bilateral IA hip & TB inj. #1  Possible spinal cord stimulator trial  EMG/PNCV of both lower extremities. (Only right side tested.)   Completed:   Diagnostic right T12-L1 LESI x1 (01/08/2024) (100/90/60/70)   Therapeutic  Palliative (PRN) options:   None established   Completed by other providers:   Therapeutic left L2-3 TFESI x1 (06/05/2023) by Alexandro Angelica, MD [facility])  Therapeutic right trochanteric bursa inj. x1 (09/14/2022) by Adeline Africa, MD [facility])  Therapeutic bilateral IA hip inj. 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      Recent Visits Date Type Provider Dept  01/22/24 Office Visit Renaldo Caroli, MD [facility]  01/08/24 Procedure visit Renaldo Caroli, MD [facility]  12/19/23 Office Visit Renaldo Caroli, MD [facility]  11/28/23 Office Visit Renaldo Caroli, MD [facility]  Showing recent visits within past 90 days and meeting all other requirements Today's Visits Date Type Provider Dept  02/26/24 Procedure visit Renaldo Caroli, MD [facility]  Showing today's visits and meeting all other requirements Future Appointments Date Type Provider Dept  03/31/24 Appointment Renaldo Caroli, MD [facility]  Showing future appointments within next 90 days and meeting all other requirements  Disposition: Discharge home  Discharge (Date  Time): 02/26/2024; 1045 hrs.   Primary Care Physician: Thersia Flax, MD Location: [facility] Note by: Candi Chafe, MD (TTS technology used. I apologize for any typographical errors that were not detected and corrected.) Date: 02/26/2024; Time: 11:14 AM  Disclaimer:  Medicine is not an Visual merchandiser. The only guarantee in medicine is that nothing is guaranteed. It is important to note that the decision to proceed with this intervention was based on the information collected from the patient. The Data and conclusions were drawn from the patient's questionnaire, the interview, and the physical examination.  Because the information was provided in large part by the patient, it cannot be guaranteed that it has not been purposely or unconsciously manipulated. Every effort has been made to obtain as much relevant data as possible for this evaluation. It is important to note that the conclusions that lead to this procedure are derived in large part from the available data. Always take into account that the treatment will also be dependent on availability of resources and existing treatment guidelines, considered by other Pain Management Practitioners as being common knowledge and practice, at the time of the intervention. For Medico-Legal purposes, it is also important to point out that variation in procedural techniques and pharmacological choices are the acceptable norm. The indications, contraindications, technique, and results of the above procedure should only be interpreted and judged by a Board-Certified Interventional Pain Specialist with extensive familiarity and expertise in the same exact procedure and technique.

## 2024-02-26 NOTE — Patient Instructions (Signed)

## 2024-02-27 ENCOUNTER — Encounter (INDEPENDENT_AMBULATORY_CARE_PROVIDER_SITE_OTHER): Payer: Self-pay | Admitting: Vascular Surgery

## 2024-02-27 ENCOUNTER — Ambulatory Visit (INDEPENDENT_AMBULATORY_CARE_PROVIDER_SITE_OTHER)

## 2024-02-27 ENCOUNTER — Telehealth: Payer: Self-pay

## 2024-02-27 ENCOUNTER — Ambulatory Visit (INDEPENDENT_AMBULATORY_CARE_PROVIDER_SITE_OTHER): Admitting: Vascular Surgery

## 2024-02-27 VITALS — BP 155/79 | HR 85 | Resp 18 | Ht 65.0 in | Wt 145.6 lb

## 2024-02-27 DIAGNOSIS — K219 Gastro-esophageal reflux disease without esophagitis: Secondary | ICD-10-CM | POA: Diagnosis not present

## 2024-02-27 DIAGNOSIS — R600 Localized edema: Secondary | ICD-10-CM | POA: Diagnosis not present

## 2024-02-27 NOTE — Telephone Encounter (Signed)
 Post procedure follow up.  LM

## 2024-03-05 ENCOUNTER — Encounter (INDEPENDENT_AMBULATORY_CARE_PROVIDER_SITE_OTHER): Payer: Self-pay | Admitting: Vascular Surgery

## 2024-03-05 NOTE — Progress Notes (Unsigned)
 Subjective:    Patient ID: Madeline Young, female    DOB: 14-May-1956, 68 y.o.   MRN: [id number] Chief Complaint  Patient presents with  . Follow-up    fu pt conv + Bilat Venous Reflux    The patient is a 68 year old female who presents today for follow up evaluation of swelling of her lower extremities.  Vascular Ultrasounds were completed for DVT and reflux disease. Additionally she had a wound on her left lower extremity and the wound has essentially healed at this time.  Prior to these issues the patient had swelling in her bilateral lower extremities.  She has tried to wear compression but notes that it is difficult for her to take on a put off due to her significant arthritis.  Given her slow healing wound the patient underwent ABIs which showed ABI 1.23 on the right and 1.19 on the left.  She has strong triphasic tibial artery waveforms bilaterally with normal toe waveforms bilaterally.   Review of Systems     Objective:    Physical Exam  BP (!) 155/79   Pulse 85   Resp 18   Ht 5\' 5"  (1.651 m)   Wt 145 lb 9.6 oz (66 kg)   BMI 24.23 kg/m   Past Medical History:  Diagnosis Date  . 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 ADHD (attention deficit hyperactivity disorder)   . Allergy   . Anxiety   . Arthritis   . Cataract    bil cateracts removed  . Complication of anesthesia    first colonoscopy pt states she woke up  . Depression   . GERD (gastroesophageal reflux disease)   . Globus sensation   . Hyperlipidemia   . Incontinence    bowels at times  . Insomnia   . Multinodular goiter (nontoxic)   . Psoriatic arthritis (HCC)   . Sjogren's syndrome (HCC) 11/2023   secondary  . Spinal stenosis of lumbar region 03/10/2015   MRI   . Statin intolerance 01/27/2013    Social History   Socioeconomic History  . Marital status: Married    Spouse name: Not on file  . Number of children: 2  . Years of education: Not on file  . Highest education level: Not on file   Occupational History  . Occupation: disability  Tobacco Use  . Smoking status: Former    Current packs/day: 0.00    Types: Cigarettes    Quit date: 08/21/1971    Years since quitting: 52.5    Passive exposure: Never  . Smokeless tobacco: Never  Vaping Use  . Vaping status: Never Used  Substance and Sexual Activity  . Alcohol  use: Yes    Alcohol /week: 7.0 standard drinks of alcohol     Types: 7 Glasses of wine per week    Comment: daily,wine  . Drug use: No  . Sexual activity: Not on file  Other Topics Concern  . Not on file  Social History Narrative   Married, retired Charity fundraiser   2 daughters some grandchildren   1 caffeinated drinks daily   1 alcoholic beverage daily   No tobacco      Social Drivers of Corporate investment banker Strain: Low Risk  (08/20/2023)   Overall Financial Resource Strain (CARDIA)   . Difficulty of Paying Living Expenses: Not hard at all  Food Insecurity: No Food Insecurity (08/20/2023)   Hunger Vital Sign   . Worried About Programme researcher, broadcasting/film/video in the Last Year: Never  true   . Ran Out of Food in the Last Year: Never true  Transportation Needs: No Transportation Needs (08/20/2023)   PRAPARE - Transportation   . Lack of Transportation (Medical): No   . Lack of Transportation (Non-Medical): No  Physical Activity: Inactive (08/20/2023)   Exercise Vital Sign   . Days of Exercise per Week: 0 days   . Minutes of Exercise per Session: 0 min  Stress: Stress Concern Present (08/20/2023)   [employer] of Occupational Health - Occupational Stress Questionnaire   . Feeling of Stress : Very much  Social Connections: Moderately Integrated (08/20/2023)   Social Connection and Isolation Panel [NHANES]   . Frequency of Communication with Friends and Family: More than three times a week   . Frequency of Social Gatherings with Friends and Family: Twice a week   . Attends Religious Services: Never   . Active Member of Clubs or Organizations: Yes   . Attends  Banker Meetings: More than 4 times per year   . Marital Status: Married  Catering manager Violence: Not At Risk (01/03/2024)   Received from [facility]   HITS   . Over the last 12 months how often did your partner physically hurt you?: Never   . Over the last 12 months how often did your partner insult you or talk down to you?: Never   . Over the last 12 months how often did your partner threaten you with physical harm?: Never   . Over the last 12 months how often did your partner scream or curse at you?: Never    Past Surgical History:  Procedure Laterality Date  . BACK SURGERY  02/17/2016   Dr. Sulema Endo- L4-L5 fusion   . BIOPSY THYROID   2014  . COLONOSCOPY    . COLONOSCOPY W/ POLYPECTOMY    . EYE SURGERY     bilateral cataract surgery w/ lens implant  . FOOT SURGERY  2006   Right foot , secondary to severe loss of joint [facility])  . POLYPECTOMY    . TONSILLECTOMY    . UPPER GASTROINTESTINAL ENDOSCOPY  2005   With empiric esophageal dilation    Family History  Problem Relation Age of Onset  . Heart disease Mother   . Hyperlipidemia Mother   . Coronary artery disease Father 54       CABG in early 102's  . Hyperlipidemia Father   . Epilepsy Grandchild        Severe form  . Colon cancer Neg Hx   . Esophageal cancer Neg Hx   . Rectal cancer Neg Hx   . Stomach cancer Neg Hx   . Colon polyps Neg Hx   . Crohn's disease Neg Hx     Allergies  Allergen Reactions  . Cosentyx [Secukinumab] Rash    Severe case of psorasis  . Sertraline Hcl Other (See Comments)    Made me crazy   . Adalimumab     Other Reaction(s): psoriasis  . Avelox [Moxifloxacin Hcl In Nacl]     Pt cant remember reaction  . Nitrofurantoin     Other reaction(s): Other (See Comments) tired Pt cant remember reaction Other reaction(s): Unknown  . Pregabalin      Cognitive issues  . Rosuvastatin     Other reaction(s): Unknown  . Latex Itching and Rash    Redness  . Macrobid  [Nitrofurantoin Monohyd Macro] Other (See Comments)    tired  . Statins Other (See Comments)    Muscle aches  Latest Ref Rng & Units 12/17/2023    3:06 PM 05/16/2023    2:34 PM 02/07/2023    2:43 PM  CBC  WBC 4.0 - 10.5 K/uL 5.0  6.6  8.4   Hemoglobin 12.0 - 15.0 g/dL 16.1  09.6  04.5   Hematocrit 36.0 - 46.0 % 33.7  34.7  37.0   Platelets 150.0 - 400.0 K/uL 310.0  326.0  300.0        CMP     Component Value Date/Time   NA 141 11/13/2023 1506   K 4.5 11/13/2023 1506   CL 103 11/13/2023 1506   CO2 28 11/13/2023 1506   GLUCOSE 96 11/13/2023 1506   BUN 25 (H) 11/13/2023 1506   CREATININE 0.75 11/13/2023 1506   CALCIUM  9.2 11/13/2023 1506   PROT 6.4 11/13/2023 1506   ALBUMIN 3.8 11/13/2023 1506   AST 18 11/13/2023 1506   ALT 14 11/13/2023 1506   ALKPHOS 57 11/13/2023 1506   BILITOT 0.4 11/13/2023 1506   GFR 81.33 11/13/2023 1506   GFRNONAA >60 02/18/2016 0322     VAS US  ABI WITH/WO TBI Result Date: 02/15/2024  LOWER EXTREMITY DOPPLER STUDY Patient Name:  SANAIA JASSO  Date of Exam:   02/08/2024 Medical Rec #: [id number]           Accession #:    [id number] Date of Birth: November 04, 1955           Patient Gender: F Patient Age:   68 years Exam Location:  [facility] Procedure:      VAS US  ABI WITH/WO TBI Referring Phys: Sharla Davis --------------------------------------------------------------------------------  Indications: Ulceration. High Risk         Hyperlipidemia, past history of smoking, coronary artery Factors:          disease.  Performing Technologist: Oneta Bilberry RVT  Examination Guidelines: A complete evaluation includes at minimum, Doppler waveform signals and systolic blood pressure reading at the level of bilateral brachial, anterior tibial, and posterior tibial arteries, when vessel segments are accessible. Bilateral testing is considered an integral part of a complete examination. Photoelectric Plethysmograph (PPG) waveforms and toe systolic  pressure readings are included as required and additional duplex testing as needed. Limited examinations for reoccurring indications may be performed as noted.  ABI Findings: +---------+------------------+-----+---------+--------+ Right    Rt Pressure (mmHg)IndexWaveform Comment  +---------+------------------+-----+---------+--------+ Brachial 147                                      +---------+------------------+-----+---------+--------+ PTA      165               1.09 triphasic         +---------+------------------+-----+---------+--------+ DP       186               1.23 triphasic         +---------+------------------+-----+---------+--------+ Great Toe192               1.27                   +---------+------------------+-----+---------+--------+ +---------+------------------+-----+---------+-------+ Left     Lt Pressure (mmHg)IndexWaveform Comment +---------+------------------+-----+---------+-------+ Brachial 151                                     +---------+------------------+-----+---------+-------+ PTA  179               1.19 triphasic        +---------+------------------+-----+---------+-------+ DP       174               1.15 triphasic        +---------+------------------+-----+---------+-------+ Great Toe158               1.05                  +---------+------------------+-----+---------+-------+ +-------+-----------+-----------+------------+------------+ ABI/TBIToday's ABIToday's TBIPrevious ABIPrevious TBI +-------+-----------+-----------+------------+------------+ Right  1.23       1.27                                +-------+-----------+-----------+------------+------------+ Left   1.19       1.05                                +-------+-----------+-----------+------------+------------+  Summary: Right: Resting right ankle-brachial index is within normal range. The right toe-brachial index is normal. Left: Resting  left ankle-brachial index is within normal range. The left toe-brachial index is normal. *See table(s) above for measurements and observations.  Electronically signed by Mikki Alexander MD on 02/15/2024 at 11:12:37 AM.    Final        Assessment & Plan:   1. Lower extremity edema (Primary) ***  2. Gastroesophageal reflux disease without esophagitis ***   Current Outpatient Medications on File Prior to Visit  Medication Sig Dispense Refill  . acetaminophen  (TYLENOL ) 500 MG tablet Take 1,000 mg by mouth every 6 (six) hours as needed.    . ALPRAZolam  (XANAX ) 0.25 MG tablet Take 1 tablet (0.25 mg total) by mouth at bedtime as needed for anxiety. 30 tablet 2  . aspirin 81 MG tablet Take 81 mg by mouth daily.    Aaron Aas b complex vitamins capsule Take 1 capsule by mouth daily.    . calcium  carbonate (OS-CAL - DOSED IN MG OF ELEMENTAL CALCIUM ) 1250 (500 Ca) MG tablet     . celecoxib  (CELEBREX ) 200 MG capsule TAKE 1 CAPSULE BY MOUTH TWICE A DAY 180 capsule 1  . cholecalciferol (VITAMIN D ) 1000 UNITS tablet Take 2,000 Units by mouth daily.    . cyanocobalamin  (VITAMIN B12) 1000 MCG tablet Take 1,000 mcg by mouth daily.    . diclofenac  Sodium (VOLTAREN ) 1 % GEL APPLY 2 GRAMS TO AFFECTED AREA 4 TIMES A DAY 300 g 3  . DULoxetine  (CYMBALTA ) 60 MG capsule Take 2 capsules (120 mg total) by mouth daily. 180 capsule 0  . fluocinonide ointment (LIDEX) 0.05 % APPLY TWICE DAILY TO AFFECTED AREAS UNTIL IMPROVED THEN AS NEEDED FOR FLARES    . leflunomide (ARAVA) 20 MG tablet Take 20 mg by mouth daily.    Aaron Aas lisdexamfetamine (VYVANSE ) 70 MG capsule Take 1 capsule (70 mg total) by mouth daily. 30 capsule 0  . Magnesium 250 MG CAPS Take by mouth. Take two by mouth daily    . melatonin 5 MG TABS Take 5 mg by mouth at bedtime.    . Multiple Vitamin (MULTIVITAMIN) tablet Take 1 tablet by mouth daily.    . naloxone  (NARCAN ) 0.4 MG/ML injection Take as needed for oversedation 1 mL 3  . Omega-3 Fatty Acids (FISH OIL) 1200 MG CAPS  Take 1,200 mg by mouth daily.     Aaron Aas  oxyCODONE -acetaminophen  (PERCOCET/ROXICET) 5-325 MG tablet Take 1 tablet by mouth every 6 (six) hours as needed for moderate pain (pain score 4-6). Do not refill less than 30 days from prior refill 120 tablet 0  . Polyethyl Glycol-Propyl Glycol 0.4-0.3 % SOLN Place 1 drop into both eyes daily as needed (for dry eyes).    . Probiotic Product (PROBIOTIC DAILY PO) Take 1 capsule by mouth daily.    . solifenacin  (VESICARE ) 10 MG tablet TAKE 1 TABLET BY MOUTH EVERY DAY 90 tablet 1  . triamcinolone  cream (KENALOG ) 0.1 % APPLY TO AFFECTED AREA TWICE A DAY UNTIL CLEAR THEN AS NEEDED    . cefdinir  (OMNICEF ) 300 MG capsule Take 1 capsule (300 mg total) by mouth 2 (two) times daily. 14 capsule 0  . furosemide  (LASIX ) 20 MG tablet Take 1 tablet (20 mg total) by mouth daily. (Patient not taking: Reported on 02/27/2024) 7 tablet 0  . lisdexamfetamine (VYVANSE ) 70 MG capsule Take 1 capsule (70 mg total) by mouth daily. (Patient not taking: Reported on 02/27/2024) 30 capsule 0  . lisdexamfetamine (VYVANSE ) 70 MG capsule Take 1 capsule (70 mg total) by mouth daily. (Patient not taking: Reported on 02/27/2024) 30 capsule 0  . mupirocin  ointment (BACTROBAN ) 2 % Apply 1 Application topically 2 (two) times daily. (Patient not taking: Reported on 02/27/2024) 22 g 0  . nystatin -triamcinolone  ointment (MYCOLOG) Apply 1 application topically 2 (two) times daily. (Patient not taking: Reported on 02/27/2024) 30 g 1  . trospium  (SANCTURA ) 20 MG tablet Take 20 mg by mouth 2 (two) times daily. (Patient not taking: Reported on 02/27/2024)     Current Facility-Administered Medications on File Prior to Visit  Medication Dose Route Frequency Provider Last Rate Last Admin  . mupirocin  cream (BACTROBAN ) 2 %   Topical BID         There are no Patient Instructions on file for this visit. No follow-ups on file.   Annamaria Barrette, NP

## 2024-03-30 NOTE — Progress Notes (Unsigned)
 PROVIDER NOTE: Interpretation of information contained herein should be left to medically-trained personnel. Specific patient instructions are provided elsewhere under Patient Instructions section of medical record. This document was created in part using AI and STT-dictation technology, any transcriptional errors that may result from this process are unintentional.  Patient: Madeline Young  Service: E/M   PCP: Marylynn Verneita CROME, MD  DOB: [birth year]  DOS: 03/31/2024  Provider: Eric DELENA Como, MD  MRN: [id number]  Delivery: Face-to-face  Specialty: Interventional Pain Management  Type: Established Patient  Setting: Ambulatory outpatient facility  Specialty designation: 09  Referring Prov.: Marylynn Verneita CROME, MD  Location: Outpatient office facility       History of present illness (HPI) [name], a 68 y.o. year old female, is here today because of her Pain from implanted hardware, sequela [T85.848S]. Madeline Young primary complain today is No chief complaint on file.  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); Chronic low back pain (1ry area of Pain) (Bilateral) (R>L) w/o sciatica; Chronic hip pain (2ry area of Pain) (Bilateral) (R>L); Decreased range of motion of hips (Bilateral); Low back pain of over 3 months duration; Low back pain radiating to legs (Bilateral); Leg swelling; Abnormal CT myelogram of lumbar spine (02/15/2023); Degenerative joint disease involving multiple joints; Cervical spondylosis without myelopathy; Lumbar stenosis with neurogenic claudication; Spinal stenosis at L4-L5 level; Pain in thoracic  spine; Acquired scoliosis; Lumbar facet Arthropathy; Lumbar radiculitis; Chronic lumbar radiculopathy (S1>L5) (Right) by EMG; Pseudoarthrosis of lumbar spine; Dextroscoliosis of lumbar spine; Thoracic radiculopathy; Abnormal NCS (nerve conduction studies) [facility]) (09/27/2023); Median nerve neuropathy (Bilateral); 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); Lumbosacral radiculopathy at L5 (Right) by EMG; Lumbosacral radiculopathy at S1 (Right) by EMG; 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 by (08/29/2023) DEXA Scan; Abnormal MRI, lumbar spine (04/19/2023) [facility]); Discogenic low back pain; Lumbar Foraminal stenosis; Lumbar DDD w/ LBP & LEP; Cellulitis of leg without foot, left; Skin ulcer, limited to breakdown of skin (HCC); Lower extremity edema; Spondylolisthesis; Fusion of lumbar spine; Pain from implanted hardware; Lumbar facet joint pain; and Spondylosis without myelopathy or radiculopathy, lumbosacral region on their pertinent problem list.  Pain Assessment: Severity of   is reported as a  /10. Location:    / . Onset:  . Quality:  . Timing:  . Modifying factor(s):  [name] Vitals:  vitals were not taken for this visit.  BMI: Estimated body mass index is 24.23 kg/m as calculated from the following:   Height as of 02/27/24: 5' 5 (1.651 m).   Weight as of 02/27/24: 145 lb 9.6 oz (66 kg).  Last encounter: 01/22/2024. Last procedure: 02/26/2024.  Reason for encounter: post-procedure evaluation and assessment.   Discussed the use of AI scribe software for clinical note transcription with the patient, who gave verbal consent to proceed.  History of Present Illness         Post-Procedure Evaluation   Type: Lumbar Fusion (Pedicle screw) Hardware Block (Medial Branch Block) #1  Laterality: Bilateral  Level: L2, L3, L4, L5, and S1 Medial Branch Level(s). Injecting these levels blocks the L3-4, L4-5, and L5-S1 lumbar facet joints.  Imaging: Fluoroscopic guidance Spinal ([id number]) Anesthesia: Local anesthesia (1-2%  Lidocaine ) Anxiolysis: IV Versed  3.0 mg Sedation: Moderate Sedation Fentanyl  1 mL (  50 mcg) DOS: 02/26/2024 Performed by: Eric DELENA Como, MD  Primary Purpose: Diagnostic/Therapeutic Indications: Low back pain severe enough to impact quality of life or function. 1. Chronic low back pain (1ry area of Pain) (Bilateral) (R>L) w/o sciatica   2. Lumbar facet joint pain   3. Lumbar facet Arthropathy   4. Pain from implanted hardware, sequela   5. Spondylolisthesis of lumbar region   6. Spondylosis without myelopathy or radiculopathy, lumbosacral region   7. Low back pain of over 3 months duration   8. Abnormal MRI, lumbar spine (04/19/2023) ([facility])   9. Abnormal CT myelogram of lumbar spine (02/15/2023)   10. Latex precautions, history of latex allergy   11. At high risk for allergic reaction to latex   12. 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 Low back pain radiating to legs (Bilateral)    NAS-11 Pain score:   Pre-procedure: 6 /10   Post-procedure: 0-No pain/10     Effectiveness:  Initial hour after procedure:   ***. Subsequent 4-6 hours post-procedure:   ***. Analgesia past initial 6 hours:   ***. Ongoing improvement:  Analgesic:  *** Function:    ***    ROM:    ***      Pharmacotherapy Assessment   Analgesic: No chronic opioid analgesics therapy prescribed by our practice. oxycodone /APAP 5/325, 1 tab p.o. 4 times daily (120/month) (# 120) (last filled on 11/06/2023) MME/day: 30 mg/day   Monitoring: [place] PMP: PDMP reviewed during this encounter.       Pharmacotherapy: No side-effects or adverse reactions reported. Compliance: No problems identified. Effectiveness: Clinically acceptable.  No notes on file  UDS:  Summary  Date Value Ref Range Status  11/28/2023 FINAL  Final    Comment:    ==================================================================== Compliance Drug Analysis,  [place] ==================================================================== Test                             Result       Flag       Units  Drug Present and Declared for Prescription Verification   Amphetamine                    [phone]        EXPECTED   ng/mg creat    Amphetamine is available as a schedule II prescription drug.    Oxycodone                       1414         EXPECTED   ng/mg creat   Oxymorphone                    332          EXPECTED   ng/mg creat   Noroxycodone                   6379         EXPECTED   ng/mg creat   Noroxymorphone                 137          EXPECTED   ng/mg creat    Sources of oxycodone  are scheduled prescription medications.    Oxymorphone, noroxycodone, and noroxymorphone are expected    metabolites of oxycodone . Oxymorphone is also available as a    scheduled prescription medication.    Duloxetine   PRESENT      EXPECTED   Acetaminophen                   PRESENT      EXPECTED   Diclofenac                      PRESENT      EXPECTED  Drug Present not Declared for Prescription Verification   Alprazolam                      41           UNEXPECTED ng/mg creat   Alpha-hydroxyalprazolam        48           UNEXPECTED ng/mg creat    Source of alprazolam  is a scheduled prescription medication. Alpha-    hydroxyalprazolam is an expected metabolite of alprazolam .    Carboxy-THC                    6            UNEXPECTED ng/mg creat    Carboxy-THC is a metabolite of tetrahydrocannabinol (THC). Source of    THC is most commonly herbal marijuana or marijuana-based products,    but THC is also present in a scheduled prescription medication.    Trace amounts of THC can be present in hemp and cannabidiol (CBD)    products. This test is not intended to distinguish between delta-9-    tetrahydrocannabinol, the predominant form of THC in most herbal or    marijuana-based products, and delta-8-tetrahydrocannabinol.  Drug Absent but Declared for  Prescription Verification   Salicylate                     Not Detected UNEXPECTED    Aspirin, as indicated in the declared medication list, is not always    detected even when used as directed.  ==================================================================== Test                      Result    Flag   Units      Ref Range   Creatinine              147              mg/dL      >=79 ==================================================================== Declared Medications:  The flagging and interpretation on this report are based on the  following declared medications.  Unexpected results may arise from  inaccuracies in the declared medications.   **Note: The testing scope of this panel includes these medications:   Amphetamine (Vyvanse )  Duloxetine  (Cymbalta )  Oxycodone    **Note: The testing scope of this panel does not include small to  moderate amounts of these reported medications:   Acetaminophen  (Tylenol )  Acetaminophen   Aspirin  Diclofenac  (Voltaren )   **Note: The testing scope of this panel does not include the  following reported medications:   Calcium   Celecoxib  (Celebrex )  Clobetasol (Temovate)  Eye Drops  Fish Oil  Fluocinonide (Lidex)  Leflunomide (Arava)  Magnesium  Melatonin  Multivitamin  Probiotic  Solifenacin  (Vesicare )  Topical  Triamcinolone  (Kenalog )  Trospium  (Sanctura )  Turmeric  Vitamin B  Vitamin B12  Vitamin D  ==================================================================== For clinical consultation, please call [phone]. ====================================================================     No results found for: CBDTHCR No results found for: D8THCCBX No results found for: D9THCCBX  ROS  Constitutional: Denies any fever or  chills Gastrointestinal: No reported hemesis, hematochezia, vomiting, or acute GI distress Musculoskeletal: Denies any acute onset joint swelling, redness, loss of ROM, or  weakness Neurological: No reported episodes of acute onset apraxia, aphasia, dysarthria, agnosia, amnesia, paralysis, loss of coordination, or loss of consciousness  Medication Review  ALPRAZolam , DULoxetine , Fish Oil, Magnesium, Polyethyl Glycol-Propyl Glycol, Probiotic Product, acetaminophen , aspirin, b complex vitamins, calcium  carbonate, cefdinir , celecoxib , cholecalciferol, cyanocobalamin , diclofenac  Sodium, fluocinonide ointment, furosemide , leflunomide, lisdexamfetamine, melatonin, multivitamin, mupirocin  ointment, naloxone , nystatin -triamcinolone  ointment, oxyCODONE -acetaminophen , solifenacin , triamcinolone  cream, and trospium   History Review  Allergy: Madeline Young is allergic to cosentyx [secukinumab], sertraline hcl, adalimumab, avelox [moxifloxacin hcl in nacl], nitrofurantoin, pregabalin , rosuvastatin, latex, macrobid [nitrofurantoin monohyd macro], and statins. Drug: Madeline Young  reports no history of drug use. Alcohol :  reports current alcohol  use of about 7.0 standard drinks of alcohol  per week. Tobacco:  reports that she quit smoking about 52 years ago. Her smoking use included cigarettes. She has never been exposed to tobacco smoke. She has never used smokeless tobacco. Social: Madeline Young  reports that she quit smoking about 52 years ago. Her smoking use included cigarettes. She has never been exposed to tobacco smoke. She has never used smokeless tobacco. She reports current alcohol  use of about 7.0 standard drinks of alcohol  per week. She reports that she does not use drugs. Medical:  has a past medical history of Adenoma (10/06/2008), ADHD (attention deficit hyperactivity disorder), Allergy, Anxiety, Arthritis, Cataract, Complication of anesthesia, Depression, GERD (gastroesophageal reflux disease), Globus sensation, Hyperlipidemia, Incontinence, Insomnia, Multinodular goiter (nontoxic), Psoriatic arthritis (HCC), Sjogren's syndrome (HCC) (11/2023), Spinal stenosis of lumbar region  (03/10/2015), and Statin intolerance (01/27/2013). Surgical: Madeline Young  has a past surgical history that includes Foot surgery (2006); Colonoscopy w/ polypectomy; Upper gastrointestinal endoscopy (2005); Tonsillectomy; Biopsy thyroid  (2014); Eye surgery; Back surgery (02/17/2016); Colonoscopy; and Polypectomy. Family: family history includes Coronary artery disease (age of onset: 29) in her father; Epilepsy in her grandchild; Heart disease in her mother; Hyperlipidemia in her father and mother.  Laboratory Chemistry Profile   Renal Lab Results  Component Value Date   BUN 25 (H) 11/13/2023   CREATININE 0.75 11/13/2023   GFR 81.33 11/13/2023   GFRAA >60 02/18/2016   GFRNONAA >60 02/18/2016    Hepatic Lab Results  Component Value Date   AST 18 11/13/2023   ALT 14 11/13/2023   ALBUMIN 3.8 11/13/2023   ALKPHOS 57 11/13/2023   HCVAB NEGATIVE 01/07/2016    Electrolytes Lab Results  Component Value Date   NA 141 11/13/2023   K 4.5 11/13/2023   CL 103 11/13/2023   CALCIUM  9.2 11/13/2023   MG 2.3 11/28/2023    Bone Lab Results  Component Value Date   VD25OH 33.35 09/11/2017   25OHVITD1 59 11/28/2023   25OHVITD2 <1.0 11/28/2023   25OHVITD3 59 11/28/2023    Inflammation (CRP: Acute Phase) (ESR: Chronic Phase) Lab Results  Component Value Date   CRP <1.0 05/16/2023   ESRSEDRATE 40 (H) 06/18/2023         Note: Above Lab results reviewed.  Recent Imaging Review  VAS US  LOWER EXTREMITY VENOUS REFLUX  Lower Venous Reflux Study  Patient Name:  Madeline Young  Date of Exam:   02/27/2024 Medical Rec #: [id number]           Accession #:    [id number] Date of Birth: 11/05/1955           Patient Gender: F Patient Age:   68 years Exam Location:  [facility] Procedure:      VAS US  LOWER EXTREMITY VENOUS REFLUX Referring Phys: ORVIN DARING  --------------------------------------------------------------------------------   Performing Technologist: Leafy Gibes  RVS    Examination Guidelines: A complete evaluation includes B-mode imaging, spectral Doppler, color Doppler, and power Doppler as needed of all accessible portions of each vessel. Bilateral testing is considered an integral part of a complete examination. Limited examinations for reoccurring indications may be performed as noted. The reflux portion of the exam is performed with the patient in reverse Trendelenburg. Significant venous reflux is defined as >500 ms in the superficial venous system, and >1 second in the deep venous system.    Venous Reflux Times +--------------+---------+------+-----------+------------+--------+ RIGHT         Reflux NoRefluxReflux TimeDiameter cmsComments                         Yes                                  +--------------+---------+------+-----------+------------+--------+ CFV           no                                             +--------------+---------+------+-----------+------------+--------+ FV prox       no                                             +--------------+---------+------+-----------+------------+--------+ FV mid        no                                             +--------------+---------+------+-----------+------------+--------+ FV dist       no                                             +--------------+---------+------+-----------+------------+--------+ Popliteal     no                                             +--------------+---------+------+-----------+------------+--------+ GSV at SFJ    no                            .50              +--------------+---------+------+-----------+------------+--------+ GSV prox thighno                            .37              +--------------+---------+------+-----------+------------+--------+ GSV mid thigh no                            .23               +--------------+---------+------+-----------+------------+--------+  GSV dist thighno                            .25              +--------------+---------+------+-----------+------------+--------+ GSV at knee   no                            .21              +--------------+---------+------+-----------+------------+--------+ GSV prox calf no                            .19              +--------------+---------+------+-----------+------------+--------+ SSV Pop Fossa no                            .24              +--------------+---------+------+-----------+------------+--------+    +--------------+---------+------+-----------+------------+--------+ LEFT          Reflux NoRefluxReflux TimeDiameter cmsComments                         Yes                                  +--------------+---------+------+-----------+------------+--------+ CFV           no                                             +--------------+---------+------+-----------+------------+--------+ FV prox       no                                             +--------------+---------+------+-----------+------------+--------+ FV mid        no                                             +--------------+---------+------+-----------+------------+--------+ FV dist       no                                             +--------------+---------+------+-----------+------------+--------+ Popliteal     no                                             +--------------+---------+------+-----------+------------+--------+ GSV at SFJ    no                            .43              +--------------+---------+------+-----------+------------+--------+ GSV prox thighno                            .  41              +--------------+---------+------+-----------+------------+--------+ GSV mid thigh no                            .27               +--------------+---------+------+-----------+------------+--------+ GSV dist thighno                            .21              +--------------+---------+------+-----------+------------+--------+ GSV at knee   no                            .21              +--------------+---------+------+-----------+------------+--------+ GSV prox calf no                            .21              +--------------+---------+------+-----------+------------+--------+ SSV Pop Fossa no                            .24              +--------------+---------+------+-----------+------------+--------+        Summary: Bilateral: - No evidence of deep vein thrombosis seen in the lower extremities, bilaterally, from the common femoral through the popliteal veins. - No evidence of superficial venous thrombosis in the lower extremities, bilaterally. - No evidence of deep venous insufficiency seen bilaterally in the lower extremity. - No evidence of superficial venous reflux seen in the greater saphenous veins bilaterally. - No evidence of superficial venous reflux seen in the short saphenous veins bilaterally.   Right: - The PTA and ATA was Imaged at the level of the Ankle; Biphasic Waveforms seen.    Left: - The PTA and ATA was Imaged at the level of the Ankle; Triphasic Waveforms seen.   *See table(s) above for measurements and observations.  Electronically signed by Selinda Gu MD on 02/28/2024 at 8:13:18 AM.      Final   Note: Reviewed        Physical Exam  General appearance: Well nourished, well developed, and well hydrated. In no apparent acute distress Mental status: Alert, oriented x 3 (person, place, & time)       Respiratory: No evidence of acute respiratory distress Eyes: PERLA Vitals: There were no vitals taken for this visit. BMI: Estimated body mass index is 24.23 kg/m as calculated from the following:   Height as of 02/27/24: 5' 5 (1.651 m).   Weight as of  02/27/24: 145 lb 9.6 oz (66 kg). Ideal: Patient weight not recorded  Assessment   Diagnosis Status  1. Pain from implanted hardware, sequela   2. Chronic low back pain (1ry area of Pain) (Bilateral) (R>L) w/o sciatica   3. Low back pain of over 3 months duration   4. Lumbar facet joint pain   5. Postop check    Controlled Controlled Controlled   Updated Problems: No problems updated.  Plan of Care  Problem-specific:  Assessment and Plan            [name] has a current medication list which includes the following long-term medication(s): calcium   carbonate, duloxetine , furosemide , leflunomide, lisdexamfetamine, lisdexamfetamine, lisdexamfetamine, and oxycodone -acetaminophen .  Pharmacotherapy (Medications Ordered): No orders of the defined types were placed in this encounter.  Orders:  No orders of the defined types were placed in this encounter.    Interventional Therapies  Risk Factors  Considerations  Medical Comorbidities:    Poor candidate for RFA    Planned  Pending:   Diagnostic bilateral lumbar hardware/Facet block #1    Under consideration:   Diagnostic bilateral lumbar hardware/Facet block #1  Diagnostic caudal ESI + diagnostic epidurogram #1  Diagnostic bilateral IA hip & TB inj. #1  Possible spinal cord stimulator trial  EMG/PNCV of both lower extremities. (Only right side tested.)   Completed:   Diagnostic right T12-L1 LESI x1 (01/08/2024) (100/90/60/70)   Therapeutic  Palliative (PRN) options:   None established   Completed by other providers:   Therapeutic left L2-3 TFESI x1 (06/05/2023) by Lynwood Priscilla Sarna, MD [facility])  Therapeutic right trochanteric bursa inj. x1 (09/14/2022) by Dorise Caron POUR, MD [facility])  Therapeutic bilateral IA hip inj. 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     No follow-ups on file.    Recent Visits Date Type Provider Dept  02/26/24 Procedure visit Tanya Glisson, MD [facility]  01/22/24 Office Visit Tanya Glisson, MD [facility]  01/08/24 Procedure visit Tanya Glisson, MD [facility]  Showing recent visits within past 90 days and meeting all other requirements Future Appointments Date Type Provider Dept  03/31/24 Appointment Tanya Glisson, MD [facility]  Showing future appointments within next 90 days and meeting all other requirements  I discussed the assessment and treatment plan with the patient. The patient was provided an opportunity to ask questions and all were answered. The patient agreed with the plan and demonstrated an understanding of the instructions.  Patient advised to call back or seek an in-person evaluation if the symptoms or condition worsens.  Duration of encounter: *** minutes.  Total time on encounter, as per AMA guidelines included both the face-to-face and non-face-to-face time personally spent by the physician and/or other qualified health care professional(s) on the day of the encounter (includes time in activities that require the physician or other qualified health care professional and does not include time in activities normally performed by clinical staff). Physician's time may include the following activities when performed: Preparing to see the patient (e.g., pre-charting review of records, searching for previously ordered imaging, lab work, and nerve conduction  tests) Review of prior analgesic pharmacotherapies. Reviewing PMP Interpreting ordered tests (e.g., lab work, imaging, nerve conduction tests) Performing post-procedure evaluations, including interpretation of diagnostic procedures Obtaining and/or reviewing separately obtained history Performing a medically appropriate examination and/or evaluation Counseling and educating the patient/family/caregiver Ordering medications, tests, or procedures Referring and communicating with other health care professionals (when not separately reported) Documenting clinical information in the electronic or other health record Independently interpreting results (not separately reported) and communicating results to the patient/ family/caregiver Care coordination (not separately reported)  Note by: Eric DELENA Como, MD (TTS and AI technology used. I apologize for any typographical errors that were not detected and corrected.) Date: 03/31/2024; Time: 11:30 AM

## 2024-03-31 ENCOUNTER — Ambulatory Visit: Attending: Pain Medicine | Admitting: Pain Medicine

## 2024-03-31 DIAGNOSIS — T85848S Pain due to other internal prosthetic devices, implants and grafts, sequela: Secondary | ICD-10-CM

## 2024-03-31 DIAGNOSIS — M5459 Other low back pain: Secondary | ICD-10-CM

## 2024-03-31 DIAGNOSIS — M545 Low back pain, unspecified: Secondary | ICD-10-CM

## 2024-03-31 DIAGNOSIS — Z09 Encounter for follow-up examination after completed treatment for conditions other than malignant neoplasm: Secondary | ICD-10-CM | POA: Diagnosis present

## 2024-03-31 DIAGNOSIS — Z91199 Patient's noncompliance with other medical treatment and regimen due to unspecified reason: Secondary | ICD-10-CM | POA: Insufficient documentation

## 2024-04-14 ENCOUNTER — Ambulatory Visit: Admitting: Pain Medicine

## 2024-04-20 NOTE — Progress Notes (Unsigned)
 PROVIDER NOTE: Interpretation of information contained herein should be left to medically-trained personnel. Specific patient instructions are provided elsewhere under Patient Instructions section of medical record. This document was created in part using AI and STT-dictation technology, any transcriptional errors that may result from this process are unintentional.  Patient: Madeline Young  Service: E/M   PCP: Marylynn Verneita CROME, MD  DOB: 05-23-1956  DOS: 04/21/2024  Provider: Eric DELENA Como, MD  MRN: [id number]  Delivery: Face-to-face  Specialty: Interventional Pain Management  Type: Established Patient  Setting: Ambulatory outpatient facility  Specialty designation: 09  Referring Prov.: Marylynn Verneita CROME, MD  Location: Outpatient office facility       History of present illness (HPI) Madeline Young, a 68 y.o. year old female, is here today because of her Chronic bilateral low back pain without sciatica [M54.50, G89.29]. Madeline Young primary complain today is No chief complaint on file.  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); Chronic low back pain (1ry area of Pain) (Bilateral) (R>L) w/o sciatica; Chronic hip pain (2ry area of Pain) (Bilateral) (R>L); Decreased range of motion of hips (Bilateral); Low back pain of over 3 months duration; Low back pain radiating to legs (Bilateral); Leg swelling; Abnormal CT myelogram of lumbar spine (02/15/2023); Degenerative joint disease involving multiple joints; Cervical spondylosis without myelopathy; Lumbar stenosis with neurogenic claudication; Spinal stenosis at L4-L5 level; Pain  in thoracic spine; Acquired scoliosis; Lumbar facet Arthropathy; Lumbar radiculitis; Chronic lumbar radiculopathy (S1>L5) (Right) by EMG; Pseudoarthrosis of lumbar spine; Dextroscoliosis of lumbar spine; Thoracic radiculopathy; Abnormal NCS (nerve conduction studies) [facility]) (09/27/2023); Median nerve neuropathy (Bilateral); 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); Lumbosacral radiculopathy at L5 (Right) by EMG; Lumbosacral radiculopathy at S1 (Right) by EMG; 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 by (08/29/2023) DEXA Scan; Abnormal MRI, lumbar spine (04/19/2023) [facility]); Discogenic low back pain; Lumbar Foraminal stenosis; Lumbar DDD w/ LBP & LEP; Cellulitis of leg without foot, left; Skin ulcer, limited to breakdown of skin (HCC); Lower extremity edema; Spondylolisthesis; Fusion of lumbar spine; Pain from implanted hardware; Lumbar facet joint pain; and Spondylosis without myelopathy or radiculopathy, lumbosacral region on their pertinent problem list.  Pain Assessment: Severity of   is reported as a  /10. Location:    / . Onset:  . Quality:  . Timing:  . Modifying factor(s):  [name] Vitals:  vitals were not taken for this visit.  BMI: Estimated body mass index is 24.23 kg/m as calculated from the following:   Height as of 02/27/24: 5' 5 (1.651 m).   Weight as of 02/27/24: 145 lb 9.6 oz (66 kg).  Last encounter: 03/31/2024. Last procedure: 02/26/2024.  Reason for encounter: post-procedure evaluation and assessment.   Discussed the use of AI scribe software for clinical note transcription with the patient, who gave verbal consent to proceed.  History of Present Illness          Post-Procedure Evaluation   Type: Lumbar Fusion (Pedicle screw) Hardware Block (Medial Branch Block)   #1  Laterality: Bilateral  Level: L2, L3, L4, L5, and S1 Medial Branch Level(s). Injecting these levels blocks the L3-4, L4-5, and L5-S1 lumbar facet joints.  Imaging: Fluoroscopic guidance Spinal ([id number]) Anesthesia: Local anesthesia  (1-2% Lidocaine ) Anxiolysis: IV Versed  3.0 mg  Sedation: Moderate Sedation Fentanyl  1 mL (50 mcg) DOS: 02/26/2024 Performed by: Eric DELENA Como, MD  Primary Purpose: Diagnostic/Therapeutic Indications: Low back pain severe enough to impact quality of life or function. 1. Chronic low back pain (1ry area of Pain) (Bilateral) (R>L) w/o sciatica   2. Lumbar facet joint pain   3. Lumbar facet Arthropathy   4. Pain from implanted hardware, sequela   5. Spondylolisthesis of lumbar region   6. Spondylosis without myelopathy or radiculopathy, lumbosacral region   7. Low back pain of over 3 months duration   8. Abnormal MRI, lumbar spine (04/19/2023) ([facility])   9. Abnormal CT myelogram of lumbar spine (02/15/2023)   10. Latex precautions, history of latex allergy   11. At high risk for allergic reaction to latex   12. 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 Low back pain radiating to legs (Bilateral)    NAS-11 Pain score:   Pre-procedure: 6 /10   Post-procedure: 0-No pain/10     Effectiveness:  Initial hour after procedure:   ***. Subsequent 4-6 hours post-procedure:   ***. Analgesia past initial 6 hours:   ***. Ongoing improvement:  Analgesic:  *** Function:    ***    ROM:    ***     Pharmacotherapy Assessment   No chronic opioid analgesics therapy prescribed by our practice. oxycodone /APAP 5/325, 1 tab p.o. 4 times daily (120/month) (# 120) (last filled on 11/06/2023) MME/day: 30 mg/day   Monitoring: [place] PMP: PDMP reviewed during this encounter.       Pharmacotherapy: No side-effects or adverse reactions reported. Compliance: No problems identified. Effectiveness: Clinically acceptable.  No notes on file  UDS:  Summary  Date Value Ref Range Status  11/28/2023 FINAL  Final    Comment:    ==================================================================== Compliance Drug Analysis,  [place] ==================================================================== Test                             Result       Flag       Units  Drug Present and Declared for Prescription Verification   Amphetamine                    [phone]        EXPECTED   ng/mg creat    Amphetamine is available as a schedule II prescription drug.    Oxycodone                       1414         EXPECTED   ng/mg creat   Oxymorphone                    332          EXPECTED   ng/mg creat   Noroxycodone                   6379         EXPECTED   ng/mg creat   Noroxymorphone                 137          EXPECTED   ng/mg creat    Sources of oxycodone  are scheduled prescription medications.    Oxymorphone, noroxycodone, and noroxymorphone are expected    metabolites of oxycodone . Oxymorphone is also available as a    scheduled prescription medication.  Duloxetine                      PRESENT      EXPECTED   Acetaminophen                   PRESENT      EXPECTED   Diclofenac                      PRESENT      EXPECTED  Drug Present not Declared for Prescription Verification   Alprazolam                      41           UNEXPECTED ng/mg creat   Alpha-hydroxyalprazolam        48           UNEXPECTED ng/mg creat    Source of alprazolam  is a scheduled prescription medication. Alpha-    hydroxyalprazolam is an expected metabolite of alprazolam .    Carboxy-THC                    6            UNEXPECTED ng/mg creat    Carboxy-THC is a metabolite of tetrahydrocannabinol (THC). Source of    THC is most commonly herbal marijuana or marijuana-based products,    but THC is also present in a scheduled prescription medication.    Trace amounts of THC can be present in hemp and cannabidiol (CBD)    products. This test is not intended to distinguish between delta-9-    tetrahydrocannabinol, the predominant form of THC in most herbal or    marijuana-based products, and delta-8-tetrahydrocannabinol.  Drug Absent but Declared for  Prescription Verification   Salicylate                     Not Detected UNEXPECTED    Aspirin, as indicated in the declared medication list, is not always    detected even when used as directed.  ==================================================================== Test                      Result    Flag   Units      Ref Range   Creatinine              147              mg/dL      >=79 ==================================================================== Declared Medications:  The flagging and interpretation on this report are based on the  following declared medications.  Unexpected results may arise from  inaccuracies in the declared medications.   **Note: The testing scope of this panel includes these medications:   Amphetamine (Vyvanse )  Duloxetine  (Cymbalta )  Oxycodone    **Note: The testing scope of this panel does not include small to  moderate amounts of these reported medications:   Acetaminophen  (Tylenol )  Acetaminophen   Aspirin  Diclofenac  (Voltaren )   **Note: The testing scope of this panel does not include the  following reported medications:   Calcium   Celecoxib  (Celebrex )  Clobetasol (Temovate)  Eye Drops  Fish Oil  Fluocinonide (Lidex)  Leflunomide (Arava)  Magnesium  Melatonin  Multivitamin  Probiotic  Solifenacin  (Vesicare )  Topical  Triamcinolone  (Kenalog )  Trospium  (Sanctura )  Turmeric  Vitamin B  Vitamin B12  Vitamin D  ==================================================================== For clinical consultation, please call [phone]. ====================================================================     No results  found for: CBDTHCR No results found for: D8THCCBX No results found for: D9THCCBX  ROS  Constitutional: Denies any fever or chills Gastrointestinal: No reported hemesis, hematochezia, vomiting, or acute GI distress Musculoskeletal: Denies any acute onset joint swelling, redness, loss of ROM, or  weakness Neurological: No reported episodes of acute onset apraxia, aphasia, dysarthria, agnosia, amnesia, paralysis, loss of coordination, or loss of consciousness  Medication Review  ALPRAZolam , DULoxetine , Fish Oil, Magnesium, Polyethyl Glycol-Propyl Glycol, Probiotic Product, acetaminophen , aspirin, b complex vitamins, calcium  carbonate, cefdinir , celecoxib , cholecalciferol, cyanocobalamin , diclofenac  Sodium, fluocinonide ointment, furosemide , leflunomide, lisdexamfetamine, melatonin, multivitamin, mupirocin  ointment, naloxone , nystatin -triamcinolone  ointment, oxyCODONE -acetaminophen , solifenacin , triamcinolone  cream, and trospium   History Review  Allergy: Madeline Young is allergic to cosentyx [secukinumab], sertraline hcl, adalimumab, avelox [moxifloxacin hcl in nacl], nitrofurantoin, pregabalin , rosuvastatin, latex, macrobid [nitrofurantoin monohyd macro], and statins. Drug: Madeline Young  reports no history of drug use. Alcohol :  reports current alcohol  use of about 7.0 standard drinks of alcohol  per week. Tobacco:  reports that she quit smoking about 52 years ago. Her smoking use included cigarettes. She has never been exposed to tobacco smoke. She has never used smokeless tobacco. Social: Madeline Young  reports that she quit smoking about 52 years ago. Her smoking use included cigarettes. She has never been exposed to tobacco smoke. She has never used smokeless tobacco. She reports current alcohol  use of about 7.0 standard drinks of alcohol  per week. She reports that she does not use drugs. Medical:  has a past medical history of Adenoma (10/06/2008), ADHD (attention deficit hyperactivity disorder), Allergy, Anxiety, Arthritis, Cataract, Complication of anesthesia, Depression, GERD (gastroesophageal reflux disease), Globus sensation, Hyperlipidemia, Incontinence, Insomnia, Multinodular goiter (nontoxic), Psoriatic arthritis (HCC), Sjogren's syndrome (HCC) (11/2023), Spinal stenosis of lumbar region  (03/10/2015), and Statin intolerance (01/27/2013). Surgical: Madeline Young  has a past surgical history that includes Foot surgery (2006); Colonoscopy w/ polypectomy; Upper gastrointestinal endoscopy (2005); Tonsillectomy; Biopsy thyroid  (2014); Eye surgery; Back surgery (02/17/2016); Colonoscopy; and Polypectomy. Family: family history includes Coronary artery disease (age of onset: 26) in her father; Epilepsy in her grandchild; Heart disease in her mother; Hyperlipidemia in her father and mother.  Laboratory Chemistry Profile   Renal Lab Results  Component Value Date   BUN 25 (H) 11/13/2023   CREATININE 0.75 11/13/2023   GFR 81.33 11/13/2023   GFRAA >60 02/18/2016   GFRNONAA >60 02/18/2016    Hepatic Lab Results  Component Value Date   AST 18 11/13/2023   ALT 14 11/13/2023   ALBUMIN 3.8 11/13/2023   ALKPHOS 57 11/13/2023   HCVAB NEGATIVE 01/07/2016    Electrolytes Lab Results  Component Value Date   NA 141 11/13/2023   K 4.5 11/13/2023   CL 103 11/13/2023   CALCIUM  9.2 11/13/2023   MG 2.3 11/28/2023    Bone Lab Results  Component Value Date   VD25OH 33.35 09/11/2017   25OHVITD1 59 11/28/2023   25OHVITD2 <1.0 11/28/2023   25OHVITD3 59 11/28/2023    Inflammation (CRP: Acute Phase) (ESR: Chronic Phase) Lab Results  Component Value Date   CRP <1.0 05/16/2023   ESRSEDRATE 40 (H) 06/18/2023         Note: Above Lab results reviewed.  Recent Imaging Review  VAS US  LOWER EXTREMITY VENOUS REFLUX  Lower Venous Reflux Study  Patient Name:  TIRSA GAIL  Date of Exam:   02/27/2024 Medical Rec #: [id number]           Accession #:    [id number] Date of Birth: 04-Jul-1956  Patient Gender: F Patient Age:   68 years Exam Location:  [facility] Procedure:      VAS US  LOWER EXTREMITY VENOUS REFLUX Referring Phys: ORVIN DARING  --------------------------------------------------------------------------------   Performing Technologist: Leafy Gibes  RVS    Examination Guidelines: A complete evaluation includes B-mode imaging, spectral Doppler, color Doppler, and power Doppler as needed of all accessible portions of each vessel. Bilateral testing is considered an integral part of a complete examination. Limited examinations for reoccurring indications may be performed as noted. The reflux portion of the exam is performed with the patient in reverse Trendelenburg. Significant venous reflux is defined as >500 ms in the superficial venous system, and >1 second in the deep venous system.    Venous Reflux Times +--------------+---------+------+-----------+------------+--------+ RIGHT         Reflux NoRefluxReflux TimeDiameter cmsComments                         Yes                                  +--------------+---------+------+-----------+------------+--------+ CFV           no                                             +--------------+---------+------+-----------+------------+--------+ FV prox       no                                             +--------------+---------+------+-----------+------------+--------+ FV mid        no                                             +--------------+---------+------+-----------+------------+--------+ FV dist       no                                             +--------------+---------+------+-----------+------------+--------+ Popliteal     no                                             +--------------+---------+------+-----------+------------+--------+ GSV at SFJ    no                            .50              +--------------+---------+------+-----------+------------+--------+ GSV prox thighno                            .37              +--------------+---------+------+-----------+------------+--------+ GSV mid thigh no                            .  23               +--------------+---------+------+-----------+------------+--------+ GSV dist thighno                            .25              +--------------+---------+------+-----------+------------+--------+ GSV at knee   no                            .21              +--------------+---------+------+-----------+------------+--------+ GSV prox calf no                            .19              +--------------+---------+------+-----------+------------+--------+ SSV Pop Fossa no                            .24              +--------------+---------+------+-----------+------------+--------+    +--------------+---------+------+-----------+------------+--------+ LEFT          Reflux NoRefluxReflux TimeDiameter cmsComments                         Yes                                  +--------------+---------+------+-----------+------------+--------+ CFV           no                                             +--------------+---------+------+-----------+------------+--------+ FV prox       no                                             +--------------+---------+------+-----------+------------+--------+ FV mid        no                                             +--------------+---------+------+-----------+------------+--------+ FV dist       no                                             +--------------+---------+------+-----------+------------+--------+ Popliteal     no                                             +--------------+---------+------+-----------+------------+--------+ GSV at SFJ    no                            .43              +--------------+---------+------+-----------+------------+--------+ GSV prox  thighno                            .41              +--------------+---------+------+-----------+------------+--------+ GSV mid thigh no                            .27               +--------------+---------+------+-----------+------------+--------+ GSV dist thighno                            .21              +--------------+---------+------+-----------+------------+--------+ GSV at knee   no                            .21              +--------------+---------+------+-----------+------------+--------+ GSV prox calf no                            .21              +--------------+---------+------+-----------+------------+--------+ SSV Pop Fossa no                            .24              +--------------+---------+------+-----------+------------+--------+        Summary: Bilateral: - No evidence of deep vein thrombosis seen in the lower extremities, bilaterally, from the common femoral through the popliteal veins. - No evidence of superficial venous thrombosis in the lower extremities, bilaterally. - No evidence of deep venous insufficiency seen bilaterally in the lower extremity. - No evidence of superficial venous reflux seen in the greater saphenous veins bilaterally. - No evidence of superficial venous reflux seen in the short saphenous veins bilaterally.   Right: - The PTA and ATA was Imaged at the level of the Ankle; Biphasic Waveforms seen.    Left: - The PTA and ATA was Imaged at the level of the Ankle; Triphasic Waveforms seen.   *See table(s) above for measurements and observations.  Electronically signed by Selinda Gu MD on 02/28/2024 at 8:13:18 AM.      Final   Note: Reviewed        Physical Exam  Vitals: There were no vitals taken for this visit. BMI: Estimated body mass index is 24.23 kg/m as calculated from the following:   Height as of 02/27/24: 5' 5 (1.651 m).   Weight as of 02/27/24: 145 lb 9.6 oz (66 kg). Ideal: Patient weight not recorded General appearance: Well nourished, well developed, and well hydrated. In no apparent acute distress Mental status: Alert, oriented x 3 (person, place, & time)        Respiratory: No evidence of acute respiratory distress Eyes: PERLA   Assessment   Diagnosis Status  1. Chronic low back pain (1ry area of Pain) (Bilateral) (R>L) w/o sciatica   2. Pain from implanted hardware, sequela   3. Lumbar facet joint pain   4. Lumbar facet Arthropathy   5. Spondylolisthesis of lumbar region   6. Spondylosis without myelopathy or radiculopathy, lumbosacral region   7. Low back pain of over 3 months duration   8.  Abnormal MRI, lumbar spine (04/19/2023) ([facility])   9. Postop check    Controlled Controlled Controlled   Updated Problems: No problems updated.  Plan of Care  Problem-specific:  Assessment and Plan            Madeline Young has a current medication list which includes the following long-term medication(s): calcium  carbonate, duloxetine , furosemide , leflunomide, lisdexamfetamine, lisdexamfetamine, lisdexamfetamine, and oxycodone -acetaminophen .  Pharmacotherapy (Medications Ordered): No orders of the defined types were placed in this encounter.  Orders:  No orders of the defined types were placed in this encounter.    Interventional Therapies  Risk Factors  Considerations  Medical Comorbidities:    Poor candidate for RFA    Planned  Pending:   Diagnostic bilateral lumbar hardware/Facet block #1    Under consideration:   Diagnostic bilateral lumbar hardware/Facet block #1  Diagnostic caudal ESI + diagnostic epidurogram #1  Diagnostic bilateral IA hip & TB inj. #1  Possible spinal cord stimulator trial  EMG/PNCV of both lower extremities. (Only right side tested.)   Completed:   Diagnostic right T12-L1 LESI x1 (01/08/2024) (100/90/60/70)   Therapeutic  Palliative (PRN) options:   None established   Completed by other providers:   Therapeutic left L2-3 TFESI x1 (06/05/2023) by Lynwood Priscilla Sarna, MD [facility])  Therapeutic right trochanteric bursa inj. x1 (09/14/2022) by Dorise Caron POUR, MD [facility])   Therapeutic bilateral IA hip inj. 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     No follow-ups on file.    Recent Visits Date Type Provider Dept  02/26/24 Procedure visit Tanya Glisson, MD [facility]  01/22/24 Office Visit Tanya Glisson, MD [facility]  Showing recent visits within past 90 days and meeting all other requirements Future Appointments Date Type Provider Dept  04/21/24 Appointment Tanya Glisson, MD [facility]  Showing future appointments within next 90 days and meeting all other requirements  I discussed the assessment and treatment plan with the patient. The patient was provided an opportunity to ask questions and all were answered. The patient agreed with the plan and demonstrated an understanding of the instructions.  Patient advised to call back or seek an in-person evaluation if the symptoms or condition worsens.  Duration of encounter: *** minutes.  Total time on encounter, as per AMA guidelines included both the face-to-face and non-face-to-face time personally spent by the physician and/or other qualified health care professional(s) on the day of the encounter (includes time in activities that require the physician or other qualified health care professional and does not include time in activities normally performed by clinical staff). Physician's time may include the following activities when performed: Preparing to  see the patient (e.g., pre-charting review of records, searching for previously ordered imaging, lab work, and nerve conduction tests) Review of prior analgesic pharmacotherapies. Reviewing PMP Interpreting ordered tests (e.g., lab work, imaging, nerve conduction tests) Performing post-procedure evaluations, including interpretation of diagnostic procedures Obtaining and/or reviewing separately obtained history Performing a medically appropriate examination and/or evaluation  Counseling and educating the patient/family/caregiver Ordering medications, tests, or procedures Referring and communicating with other health care professionals (when not separately reported) Documenting clinical information in the electronic or other health record Independently interpreting results (not separately reported) and communicating results to the patient/ family/caregiver Care coordination (not separately reported)  Note by: Eric DELENA Como, MD (TTS and AI technology used. I apologize for any typographical errors that were not detected and corrected.) Date: 04/21/2024; Time: 6:45 PM

## 2024-04-21 ENCOUNTER — Encounter: Payer: Self-pay | Admitting: Pain Medicine

## 2024-04-21 ENCOUNTER — Telehealth: Payer: Self-pay | Admitting: Neurosurgery

## 2024-04-21 ENCOUNTER — Ambulatory Visit: Attending: Pain Medicine | Admitting: Pain Medicine

## 2024-04-21 VITALS — BP 138/86 | HR 60 | Temp 97.8°F | Resp 16 | Ht 65.0 in | Wt 143.0 lb

## 2024-04-21 DIAGNOSIS — L989 Disorder of the skin and subcutaneous tissue, unspecified: Secondary | ICD-10-CM | POA: Insufficient documentation

## 2024-04-21 DIAGNOSIS — Z87898 Personal history of other specified conditions: Secondary | ICD-10-CM | POA: Insufficient documentation

## 2024-04-21 DIAGNOSIS — M545 Low back pain, unspecified: Secondary | ICD-10-CM | POA: Diagnosis present

## 2024-04-21 DIAGNOSIS — R94130 Abnormal response to nerve stimulation, unspecified: Secondary | ICD-10-CM | POA: Insufficient documentation

## 2024-04-21 DIAGNOSIS — M47817 Spondylosis without myelopathy or radiculopathy, lumbosacral region: Secondary | ICD-10-CM | POA: Insufficient documentation

## 2024-04-21 DIAGNOSIS — M5134 Other intervertebral disc degeneration, thoracic region: Secondary | ICD-10-CM | POA: Diagnosis present

## 2024-04-21 DIAGNOSIS — M961 Postlaminectomy syndrome, not elsewhere classified: Secondary | ICD-10-CM | POA: Diagnosis present

## 2024-04-21 DIAGNOSIS — M4316 Spondylolisthesis, lumbar region: Secondary | ICD-10-CM | POA: Diagnosis not present

## 2024-04-21 DIAGNOSIS — R937 Abnormal findings on diagnostic imaging of other parts of musculoskeletal system: Secondary | ICD-10-CM | POA: Insufficient documentation

## 2024-04-21 DIAGNOSIS — Z09 Encounter for follow-up examination after completed treatment for conditions other than malignant neoplasm: Secondary | ICD-10-CM | POA: Diagnosis present

## 2024-04-21 DIAGNOSIS — M47816 Spondylosis without myelopathy or radiculopathy, lumbar region: Secondary | ICD-10-CM | POA: Diagnosis present

## 2024-04-21 DIAGNOSIS — M5459 Other low back pain: Secondary | ICD-10-CM | POA: Insufficient documentation

## 2024-04-21 DIAGNOSIS — T85848S Pain due to other internal prosthetic devices, implants and grafts, sequela: Secondary | ICD-10-CM | POA: Insufficient documentation

## 2024-04-21 DIAGNOSIS — M4726 Other spondylosis with radiculopathy, lumbar region: Secondary | ICD-10-CM | POA: Diagnosis not present

## 2024-04-21 DIAGNOSIS — M5416 Radiculopathy, lumbar region: Secondary | ICD-10-CM | POA: Diagnosis present

## 2024-04-21 DIAGNOSIS — G8929 Other chronic pain: Secondary | ICD-10-CM | POA: Insufficient documentation

## 2024-04-21 NOTE — Telephone Encounter (Signed)
 I spoke to patient and she states at her last visit in February you had mention there is a provider from [facility] hand specialist for her carpal tunnel and she is interested in having a referral and would also like the name of the provider you were mentioning. I see the conversation from a telephone note but not a provider name.

## 2024-04-21 NOTE — Patient Instructions (Addendum)
 Spinal Cord Stimulation Trial Information A spinal cord stimulation trial is a test to see whether a spinal cord stimulator reduces your pain. A spinal cord stimulator is a small device that is inserted (implanted) in your back. The stimulator has small wires (leads) that connect it to your spinal cord. The stimulator sends electrical pulses through the leads to the spinal cord. This can relieve pain. Settings for the stimulator can be adjusted with a remote device to find the best pain control. Your health care provider may suggest a spinal cord stimulation trial if other treatments for long-lasting (chronic) pain have not worked for you. Spinal cord stimulation may be used to manage pain that is caused by: Failed back surgery. Heart pain (angina) or a blood vessel problem (peripheral vascular disease). Pain after amputation (phantom limb sensation). Nerve-related pain. Complex regional pain syndrome. Painful inflammation of a thin membrane that covers the brain and spinal cord (arachnoiditis). Other syndromes that involve chronic pain or injuries related to the spinal cord. For the trial, the stimulator is attached to your back instead of inserted under the skin. A trial period is usually 3-5 days, but this can vary among health care providers. After your trial period, you and your health care provider will discuss whether a permanent spinal cord stimulator is an option for you. The permanent stimulator may be an option depending on: Whether the stimulator reduces your pain during the trial. Whether the stimulator fits into your lifestyle. Whether the cost of the stimulator is covered by your insurance. What are the risks? Generally, a spinal cord stimulation trial is safe. However, problems can occur, including: Bleeding or pain at the insertion site of the leads. Infection at the insertion site or around the leads. Allergic reactions to medicines, devices, or dyes. Damage to the skin, nerves,  back muscles, or spinal cord where the leads are placed. Inability to move the legs (paralysis). Numbness in the legs. Inability to control when you urinate or have a bowel movement (incontinence). Spinal fluid leakage. How is a spinal cord stimulator placed for a trial?  For a trial period, the stimulator generator and battery will be outside the body, typically on a belt that you will wear around your waist. Only the leads that connect the stimulator to the spinal cord are implanted under your skin. The exact location of the stimulator depends on where you have pain. There are two types of surgery for implanting the leads: Noninvasive surgery. In this type of surgery, a small incision is made and needles are used to place the leads under your skin. Open surgery. In this type of surgery, a larger incision is made, and the leads are implanted directly into your back. How to care for yourself during the trial period Incision care Check your incisions every day for signs of infection. Check for: Redness, swelling, or pain. Fluid or blood. Warmth. Pus or a bad smell. Activity Return to your normal activities as told by your health care provider. Ask your health care provider what activities are safe for you. You may have to avoid lifting. Ask your health care provider how much you can safely lift. General instructions Follow your health care provider's instructions about how to take care of your spinal cord stimulator and your incision. Make sure you write down the following information so that you can share this information with your health care provider: Your responses to the stimulator. Describe these as told by your health care provider. Your pain level throughout  the day. The amount and kind of pain medicine that you take. Do not take baths, swim, or use a hot tub until your health care provider approves. Ask your health care provider if you may take showers. You may only be allowed to  take sponge baths. Take over-the-counter and prescription medicines only as told by your health care provider. Tell all health care providers who provide care for you that you have a spinal cord stimulator. This is important information that could affect the medical treatment that you receive. Keep all follow-up visits. This is important. Contact a health care provider if: You have any of these signs of infection: Redness, swelling, or pain around your incision. Fluid or blood coming from your incision. Warmth coming from your incision. Pus or a bad smell coming from your incision. A fever. Get help right away if: Your pain gets worse. The stimulator leads come out. You develop numbness or weakness in your legs, or you have difficulty walking. You have problems urinating or having a bowel movement. You have symptoms that last for more than 2-3 days or your symptoms suddenly get worse. These symptoms may be an emergency. Get help right away. Call 911. Do not wait to see if the symptoms will go away. Do not drive yourself to the hospital. Summary A spinal cord stimulator is a small device that sends electrical pulses to your spinal cord. This can relieve pain caused by many different health conditions. Before a permanent stimulator is placed, you will have a trial using a temporary stimulator that is not implanted under your skin. This helps determine if a stimulator will reduce your pain. For the trial, only the leads that connect the stimulator to the spinal cord are implanted under your skin. During the trial period, make sure you write down information about your pain and your responses to the stimulator so that you can share this information with your health care provider. Keep all follow-up visits. This is important. Contact your health care provider if you have problems during the trial. This information is not intended to replace advice given to you by your health care provider. Make  sure you discuss any questions you have with your health care provider. Document Revised: 06/28/2021 Document Reviewed: 06/28/2021 Elsevier Patient Education  2024 [employer] ______________________________________________________________________    Preparing for your procedure  Appointments: If you think you may not be able to keep your appointment, call 24-48 hours in advance to cancel. We need time to make it available to others.  Procedure visits are for procedures only. During your procedure appointment there will be: NO Prescription Refills*. NO medication changes or discussions*. NO discussion of disability issues*. NO unrelated pain problem evaluations*. NO evaluations to order other pain procedures*. *These will be addressed at a separate and distinct evaluation encounter on the provider's evaluation schedule and not during procedure days.  Instructions: Food intake: Avoid eating anything solid for at least 8 hours prior to your procedure. Clear liquid intake: You may take clear liquids such as water up to 2 hours prior to your procedure. (No carbonated drinks. No soda.) Transportation: Unless otherwise stated by your physician, bring a driver. (Driver cannot be a Market researcher, Pharmacist, community, or any other form of public transportation.) Morning Medicines: Except for blood thinners, take all of your other morning medications with a sip of water. Make sure to take your heart and blood pressure medicines. If your blood pressure's lower number is above 100, the case will be rescheduled. Blood  thinners: Make sure to stop your blood thinners as instructed.  If you take a blood thinner, but were not instructed to stop it, call our office [phone] and ask to talk to a nurse. Not stopping a blood thinner prior to certain procedures could lead to serious complications. Diabetics on insulin: Notify the staff so that you can be scheduled 1st case in the morning. If your diabetes requires high dose  insulin, take only  of your normal insulin dose the morning of the procedure and notify the staff that you have done so. Preventing infections: Shower with an antibacterial soap the morning of your procedure.  Build-up your immune system: Take 1000 mg of Vitamin C with every meal (3 times a day) the day prior to your procedure. Antibiotics: Inform the nursing staff if you are taking any antibiotics or if you have any conditions that may require antibiotics prior to procedures. (Example: recent joint implants)   Pregnancy: If you are pregnant make sure to notify the nursing staff. Not doing so may result in injury to the fetus, including death.  Sickness: If you have a cold, fever, or any active infections, call and cancel or reschedule your procedure. Receiving steroids while having an infection may result in complications. Arrival: You must be in the facility at least 30 minutes prior to your scheduled procedure. Tardiness: Your scheduled time is also the cutoff time. If you do not arrive at least 15 minutes prior to your procedure, you will be rescheduled.  Children: Do not bring any children with you. Make arrangements to keep them home. Dress appropriately: There is always a possibility that your clothing may get soiled. Avoid long dresses. Valuables: Do not bring any jewelry or valuables.  Reasons to call and reschedule or cancel your procedure: (Following these recommendations will minimize the risk of a serious complication.) Surgeries: Avoid having procedures within 2 weeks of any surgery. (Avoid for 2 weeks before or after any surgery). Flu Shots: Avoid having procedures within 2 weeks of a flu shots or . (Avoid for 2 weeks before or after immunizations). Barium: Avoid having a procedure within 7-10 days after having had a radiological study involving the use of radiological contrast. (Myelograms, Barium swallow or enema study). Heart attacks: Avoid any elective procedures or surgeries for  the initial 6 months after a Myocardial Infarction (Heart Attack). Blood thinners: It is imperative that you stop these medications before procedures. Let us  know if you if you take any blood thinner.  Infection: Avoid procedures during or within two weeks of an infection (including chest colds or gastrointestinal problems). Symptoms associated with infections include: Localized redness, fever, chills, night sweats or profuse sweating, burning sensation when voiding, cough, congestion, stuffiness, runny nose, sore throat, diarrhea, nausea, vomiting, cold or Flu symptoms, recent or current infections. It is specially important if the infection is over the area that we intend to treat. Heart and lung problems: Symptoms that may suggest an active cardiopulmonary problem include: cough, chest pain, breathing difficulties or shortness of breath, dizziness, ankle swelling, uncontrolled high or unusually low blood pressure, and/or palpitations. If you are experiencing any of these symptoms, cancel your procedure and contact your primary care physician for an evaluation.  Remember:  Regular Business hours are:  Monday to Thursday 8:00 AM to 4:00 PM  Provider's Schedule: Eric Como, MD:  Procedure days: Tuesday and Thursday 7:30 AM to 4:00 PM  Wallie Sherry, MD:  Procedure days: Monday and Wednesday 7:30 AM to 4:00 PM  Last  Updated: 09/18/2023 ______________________________________________________________________    Pain Management Discharge Instructions  General Discharge Instructions :  If you need to reach your doctor call: Monday-Friday 8:00 am - 4:00 pm at [phone] or toll free [phone].  After clinic hours [phone] to have operator reach doctor.  Bring all of your medication bottles to all your appointments in the pain clinic.  To cancel or reschedule your appointment with Pain Management please remember to call 24 hours in advance to avoid a fee.  Refer to the  educational materials which you have been given on: General Risks, I had my Procedure. Discharge Instructions, Post Sedation.  Post Procedure Instructions:  The drugs you were given will stay in your system until tomorrow, so for the next 24 hours you should not drive, make any legal decisions or drink any alcoholic beverages.  You may eat anything you prefer, but it is better to start with liquids then soups and crackers, and gradually work up to solid foods.  Please notify your doctor immediately if you have any unusual bleeding, trouble breathing or pain that is not related to your normal pain.  Depending on the type of procedure that was done, some parts of your body may feel week and/or numb.  This usually clears up by tonight or the next day.  Walk with the use of an assistive device or accompanied by an adult for the 24 hours.  You may use ice on the affected area for the first 24 hours.  Put ice in a Ziploc bag and cover with a towel and place against area 15 minutes on 15 minutes off.  You may switch to heat after 24 hours.

## 2024-04-21 NOTE — Telephone Encounter (Signed)
 Patient called to find out who the hand specialist that Dr. Claudene consulted with regarding her case. She states she would like a referral to see this provider.

## 2024-04-21 NOTE — Progress Notes (Signed)
 Safety precautions to be maintained throughout the outpatient stay will include: orient to surroundings, keep bed in low position, maintain call bell within reach at all times, provide assistance with transfer out of bed and ambulation.   Patient  has 5 sores on lower left outer leg. I think its skin infection I was given IV antibiotics before procedure

## 2024-04-23 ENCOUNTER — Telehealth: Payer: Self-pay | Admitting: Psychiatry

## 2024-04-23 ENCOUNTER — Other Ambulatory Visit: Payer: Self-pay | Admitting: Family Medicine

## 2024-04-23 DIAGNOSIS — G5603 Carpal tunnel syndrome, bilateral upper limbs: Secondary | ICD-10-CM

## 2024-04-23 NOTE — Telephone Encounter (Signed)
 Next visit is 06/11/24. Requesting refill on Vyvanse  70 mg called to:  [id number] [place], [place] - [address]   Phone: [phone]  Fax: [phone]

## 2024-04-23 NOTE — Telephone Encounter (Signed)
LF 6/20, due 7/18.

## 2024-04-23 NOTE — Telephone Encounter (Signed)
 LMOM informing patient of the providers name for the carpal tunnel surgery. The referral has been placed.

## 2024-04-25 ENCOUNTER — Encounter: Payer: Self-pay | Admitting: Advanced Practice Midwife

## 2024-04-25 ENCOUNTER — Other Ambulatory Visit: Payer: Self-pay

## 2024-04-25 DIAGNOSIS — F900 Attention-deficit hyperactivity disorder, predominantly inattentive type: Secondary | ICD-10-CM

## 2024-04-25 MED ORDER — LISDEXAMFETAMINE DIMESYLATE 70 MG PO CAPS: 70.0000 mg | ORAL_CAPSULE | Freq: Every day | ORAL | 0 refills | Status: DC

## 2024-04-25 NOTE — Telephone Encounter (Signed)
 Pended.

## 2024-04-29 ENCOUNTER — Telehealth: Payer: Self-pay

## 2024-04-29 NOTE — Telephone Encounter (Signed)
 Just an FYI for Dr Tanya. I got auth for the MRI and got it scheduled but when I called the patient to give her the dos, she said she has changed her mind about the SCS trial for now so she wanted to cancel the MRI. She may pursue this once she changes insurances.

## 2024-05-05 ENCOUNTER — Other Ambulatory Visit: Payer: Self-pay | Admitting: Internal Medicine

## 2024-05-05 NOTE — Telephone Encounter (Unsigned)
 Copied from CRM [id number]. Topic: Clinical - Medication Refill >> May 05, 2024  2:13 PM Henretta I wrote: Medication: oxyCODONE -acetaminophen  (PERCOCET/ROXICET) 5-325 MG tablet  Has the patient contacted their pharmacy? Yes, pharm stated they do not have prescription for refill  (Agent: If no, request that the patient contact the pharmacy for the refill. If patient does not wish to contact the pharmacy document the reason why and proceed with request.) (Agent: If yes, when and what did the pharmacy advise?)  This is the patient's preferred pharmacy:  [id number] [place], [place] - [address] [place] [address] Phone: [phone] Fax: [phone]  Is this the correct pharmacy for this prescription? Yes If no, delete pharmacy and type the correct one.   Has the prescription been filled recently? No  Is the patient out of the medication? No, has 2 left.  Has the patient been seen for an appointment in the last year OR does the patient have an upcoming appointment? Yes  Can we respond through MyChart? No  Agent: Please be advised that Rx refills may take up to 3 business days. We ask that you follow-up with your pharmacy.

## 2024-05-05 NOTE — Telephone Encounter (Signed)
 FYI Only or Action Required?: Action required by provider: medication refill request.  Patient was last seen in primary care on 02/11/2024 by Marylynn Verneita CROME, MD.  Called Nurse Triage reporting No chief complaint on file..  Symptoms began today.  Interventions attempted: Nothing.  Symptoms are: stable.  Triage Disposition: No disposition on file.  Patient/caregiver understands and will follow disposition?:

## 2024-05-06 ENCOUNTER — Other Ambulatory Visit

## 2024-05-06 NOTE — Telephone Encounter (Signed)
 Refilled: 02/11/2024 Last OV: 02/11/2024 Next OV: 05/13/2024

## 2024-05-07 MED ORDER — OXYCODONE-ACETAMINOPHEN 5-325 MG PO TABS
1.0000 | ORAL_TABLET | Freq: Four times a day (QID) | ORAL | 0 refills | Status: DC | PRN
Start: 2024-05-07 — End: 2024-05-14

## 2024-05-13 ENCOUNTER — Encounter: Payer: Self-pay | Admitting: Internal Medicine

## 2024-05-13 ENCOUNTER — Ambulatory Visit: Admitting: Internal Medicine

## 2024-05-13 VITALS — BP 124/60 | HR 87 | Temp 98.4°F | Ht 65.0 in | Wt 148.2 lb

## 2024-05-13 DIAGNOSIS — E785 Hyperlipidemia, unspecified: Secondary | ICD-10-CM

## 2024-05-13 DIAGNOSIS — L400 Psoriasis vulgaris: Secondary | ICD-10-CM

## 2024-05-13 DIAGNOSIS — R7301 Impaired fasting glucose: Secondary | ICD-10-CM

## 2024-05-13 DIAGNOSIS — M5416 Radiculopathy, lumbar region: Secondary | ICD-10-CM | POA: Diagnosis not present

## 2024-05-13 DIAGNOSIS — L4052 Psoriatic arthritis mutilans: Secondary | ICD-10-CM

## 2024-05-13 MED ORDER — CELECOXIB 200 MG PO CAPS: 200.0000 mg | ORAL_CAPSULE | Freq: Two times a day (BID) | ORAL | 1 refills | Status: AC

## 2024-05-13 NOTE — Patient Instructions (Signed)
 I will look into otesla as an option for your placque psoriasis

## 2024-05-13 NOTE — Progress Notes (Unsigned)
 Subjective:  Patient ID: Honor BRIANNAH LONA, female    DOB: [birth year]  Age: 68 y.o. MRN: [id number]  CC: There were no encounter diagnoses.   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  Was ot not successful.  Planning  a second one with Dr.  Darlis in [place] at [facility]   2) left ear/jaw pain : has TMJ and brusism  3) peeling scaling rash on left lower extremity .  Etiology   presumed to be psoriasis by patient ,  but not responding to triamcinolone  prescribed by her dermatologist  but not this particular rash    Outpatient Medications Prior to Visit  Medication Sig Dispense Refill   acetaminophen  (TYLENOL ) 500 MG tablet Take 1,000 mg by mouth every 6 (six) hours as needed.     aspirin 81 MG tablet Take 81 mg by mouth daily.     b complex vitamins capsule Take 1 capsule by mouth daily.     calcium  carbonate (OS-CAL - DOSED IN MG OF ELEMENTAL CALCIUM ) 1250 (500 Ca) MG tablet      celecoxib  (CELEBREX ) 200 MG capsule TAKE 1 CAPSULE BY MOUTH TWICE A DAY 180 capsule 1   cholecalciferol (VITAMIN D ) 1000 UNITS tablet Take 2,000 Units by mouth daily.     cyanocobalamin  (VITAMIN B12) 1000 MCG tablet Take 1,000 mcg by mouth daily.     diclofenac  Sodium (VOLTAREN ) 1 % GEL APPLY 2 GRAMS TO AFFECTED AREA 4 TIMES A DAY 300 g 3   DULoxetine  (CYMBALTA ) 60 MG capsule Take 2 capsules (120 mg total) by mouth daily. 180 capsule 0   fluocinonide ointment (LIDEX) 0.05 % APPLY TWICE DAILY TO AFFECTED AREAS UNTIL IMPROVED THEN AS NEEDED FOR FLARES     leflunomide (ARAVA) 20 MG tablet Take 20 mg by mouth daily.     lisdexamfetamine  (VYVANSE ) 70 MG capsule Take 1 capsule (70 mg total) by mouth daily. 30 capsule 0   Magnesium 250 MG CAPS Take by mouth. Take two by mouth daily     melatonin 5 MG TABS Take 5 mg by mouth at bedtime.     Multiple Vitamin (MULTIVITAMIN) tablet Take 1 tablet by mouth daily.     Omega-3 Fatty Acids (FISH OIL) 1200 MG CAPS Take 1,200 mg by mouth daily.      oxyCODONE -acetaminophen  (PERCOCET/ROXICET) 5-325 MG tablet Take 1 tablet by mouth every 6 (six) hours as needed for moderate pain (pain score 4-6). Do not refill less than 30 days from prior refill 120 tablet 0   Polyethyl Glycol-Propyl Glycol 0.4-0.3 % SOLN Place 1 drop into both eyes daily as needed (for dry eyes).     Probiotic Product (PROBIOTIC DAILY PO) Take 1 capsule by mouth daily.     solifenacin  (VESICARE ) 10 MG tablet TAKE 1 TABLET BY MOUTH EVERY DAY 90 tablet 1   triamcinolone  cream (KENALOG ) 0.1 % APPLY TO AFFECTED AREA TWICE A DAY UNTIL CLEAR THEN AS NEEDED     ALPRAZolam  (XANAX ) 0.25 MG tablet Take 1 tablet (0.25 mg total) by mouth at bedtime as needed  for anxiety. (Patient not taking: Reported on 05/13/2024) 30 tablet 2   naloxone  (NARCAN ) 0.4 MG/ML injection Take as needed for oversedation (Patient not taking: Reported on 05/13/2024) 1 mL 3   cefdinir  (OMNICEF ) 300 MG capsule Take 1 capsule (300 mg total) by mouth 2 (two) times daily. 14 capsule 0   trospium  (SANCTURA ) 20 MG tablet Take 20 mg by mouth 2 (two) times daily.     Facility-Administered Medications Prior to Visit  Medication Dose Route Frequency Provider Last Rate Last Admin   mupirocin  cream (BACTROBAN ) 2 %   Topical BID         Review of Systems;  Patient denies headache, fevers, malaise, unintentional weight loss, skin rash, eye pain, sinus congestion and sinus pain, sore throat, dysphagia,  hemoptysis , cough, dyspnea, wheezing, chest pain, palpitations, orthopnea, edema, abdominal pain, nausea, melena, diarrhea, constipation, flank pain, dysuria, hematuria, urinary   Frequency, nocturia, numbness, tingling, seizures,  Focal weakness, Loss of consciousness,  Tremor, insomnia, depression, anxiety, and suicidal ideation.      Objective:  BP 124/60   Pulse 87   Temp 98.4 F (36.9 C) (Oral)   Ht 5' 5 (1.651 m)   Wt 148 lb 3.2 oz (67.2 kg)   SpO2 97%   BMI 24.66 kg/m   BP Readings from Last 3 Encounters:  05/13/24 124/60  04/21/24 138/86  02/27/24 (!) 155/79    Wt Readings from Last 3 Encounters:  05/13/24 148 lb 3.2 oz (67.2 kg)  04/21/24 143 lb (64.9 kg)  02/27/24 145 lb 9.6 oz (66 kg)    Physical Exam  Lab Results  Component Value Date   HGBA1C 6.1 01/31/2022    Lab Results  Component Value Date   CREATININE 0.75 11/13/2023   CREATININE 0.75 05/16/2023   CREATININE 0.83 11/09/2022    Lab Results  Component Value Date   WBC 5.0 12/17/2023   HGB 11.1 (L) 12/17/2023   HCT 33.7 (L) 12/17/2023   PLT 310.0 12/17/2023   GLUCOSE 96 11/13/2023   CHOL 365 (H) 11/09/2022   TRIG 81.0 11/09/2022   HDL 71.40 11/09/2022   LDLDIRECT 256.0 11/09/2022   LDLCALC 277 (H) 11/09/2022   ALT 14 11/13/2023   AST 18 11/13/2023   NA 141 11/13/2023   K 4.5 11/13/2023   CL 103 11/13/2023   CREATININE 0.75 11/13/2023   BUN 25 (H) 11/13/2023   CO2 28 11/13/2023   TSH 0.77 11/13/2023   INR 1.0 02/07/2023   HGBA1C 6.1 01/31/2022    VAS US  LOWER EXTREMITY VENOUS REFLUX Result Date: 02/28/2024  Lower Venous Reflux Study Patient Name:  MALYSSA MARIS  Date of Exam:   02/27/2024 Medical Rec #: [id number]           Accession #:    [id number] Date of Birth: 11-03-1955           Patient Gender: F Patient Age:   36 years Exam Location:  [facility] Procedure:      VAS US  LOWER EXTREMITY VENOUS REFLUX Referring Phys: ORVIN DARING --------------------------------------------------------------------------------  Performing Technologist: Leafy Gibes RVS  Examination Guidelines: A complete evaluation includes B-mode imaging, spectral Doppler,  color Doppler, and power Doppler as needed of all accessible portions of each vessel. Bilateral testing is considered an integral part of a complete examination. Limited examinations for reoccurring indications may be performed as noted. The reflux portion of the exam is performed with the patient in reverse Trendelenburg. Significant venous reflux is defined as >  500 ms in the superficial venous system, and >1 second in the deep venous system.  Venous Reflux Times +--------------+---------+------+-----------+------------+--------+ RIGHT         Reflux NoRefluxReflux TimeDiameter cmsComments                         Yes                                  +--------------+---------+------+-----------+------------+--------+ CFV           no                                             +--------------+---------+------+-----------+------------+--------+ FV prox       no                                             +--------------+---------+------+-----------+------------+--------+ FV mid        no                                             +--------------+---------+------+-----------+------------+--------+ FV dist       no                                             +--------------+---------+------+-----------+------------+--------+ Popliteal     no                                             +--------------+---------+------+-----------+------------+--------+ GSV at SFJ    no                            .50              +--------------+---------+------+-----------+------------+--------+ GSV prox thighno                            .37              +--------------+---------+------+-----------+------------+--------+ GSV mid thigh no                            .23              +--------------+---------+------+-----------+------------+--------+ GSV dist thighno                            .25               +--------------+---------+------+-----------+------------+--------+ GSV at knee   no                            .21              +--------------+---------+------+-----------+------------+--------+ GSV prox calf  no                            .19              +--------------+---------+------+-----------+------------+--------+ SSV Pop Fossa no                            .24              +--------------+---------+------+-----------+------------+--------+  +--------------+---------+------+-----------+------------+--------+ LEFT          Reflux NoRefluxReflux TimeDiameter cmsComments                         Yes                                  +--------------+---------+------+-----------+------------+--------+ CFV           no                                             +--------------+---------+------+-----------+------------+--------+ FV prox       no                                             +--------------+---------+------+-----------+------------+--------+ FV mid        no                                             +--------------+---------+------+-----------+------------+--------+ FV dist       no                                             +--------------+---------+------+-----------+------------+--------+ Popliteal     no                                             +--------------+---------+------+-----------+------------+--------+ GSV at SFJ    no                            .43              +--------------+---------+------+-----------+------------+--------+ GSV prox thighno                            .41              +--------------+---------+------+-----------+------------+--------+ GSV mid thigh no                            .27              +--------------+---------+------+-----------+------------+--------+ GSV dist thighno                            .  21               +--------------+---------+------+-----------+------------+--------+ GSV at knee   no                            .21              +--------------+---------+------+-----------+------------+--------+ GSV prox calf no                            .21              +--------------+---------+------+-----------+------------+--------+ SSV Pop Fossa no                            .24              +--------------+---------+------+-----------+------------+--------+   Summary: Bilateral: - No evidence of deep vein thrombosis seen in the lower extremities, bilaterally, from the common femoral through the popliteal veins. - No evidence of superficial venous thrombosis in the lower extremities, bilaterally. - No evidence of deep venous insufficiency seen bilaterally in the lower extremity. - No evidence of superficial venous reflux seen in the greater saphenous veins bilaterally. - No evidence of superficial venous reflux seen in the short saphenous veins bilaterally.  Right: - The PTA and ATA was Imaged at the level of the Ankle; Biphasic Waveforms seen.  Left: - The PTA and ATA was Imaged at the level of the Ankle; Triphasic Waveforms seen.  *See table(s) above for measurements and observations. Electronically signed by Selinda Gu MD on 02/28/2024 at 8:13:18 AM.    Final    DG PAIN CLINIC C-ARM 1-60 MIN NO REPORT Result Date: 02/26/2024 Fluoro was used, but no Radiologist interpretation will be provided. Please refer to NOTES tab for provider progress note.   Assessment & Plan:  .There are no diagnoses linked to this encounter.   I spent 34 minutes on the day of this face to face encounter reviewing patient's  most recent visit with cardiology,  nephrology,  and neurology,  prior relevant surgical and non surgical procedures, recent  labs and imaging studies, counseling on weight management,  reviewing the assessment and plan with patient, and post visit ordering and reviewing of  diagnostics and  therapeutics with patient  .   Follow-up: No follow-ups on file.   Verneita LITTIE Kettering, MD

## 2024-05-13 NOTE — Assessment & Plan Note (Signed)
 Currently affecting left lower leg.  Not responding to triamcinolone  cream.  Will review potential interactions between otesla and leflunomide.

## 2024-05-14 LAB — LIPID PANEL
Cholesterol: 356 mg/dL — ABNORMAL HIGH (ref 0–200)
HDL: 56.6 mg/dL (ref >39.00)
LDL Cholesterol: 268 mg/dL — ABNORMAL HIGH (ref 0–99)
NonHDL: 299.24
Total CHOL/HDL Ratio: 6
Triglycerides: 155 mg/dL — ABNORMAL HIGH (ref 0.0–149.0)
VLDL: 31 mg/dL (ref 0.0–40.0)

## 2024-05-14 LAB — COMPREHENSIVE METABOLIC PANEL WITH GFR
ALT: 18 U/L (ref 0–35)
AST: 17 U/L (ref 0–37)
Albumin: 4.3 g/dL (ref 3.5–5.2)
Alkaline Phosphatase: 62 U/L (ref 39–117)
BUN: 22 mg/dL (ref 6–23)
CO2: 31 meq/L (ref 19–32)
Calcium: 9.1 mg/dL (ref 8.4–10.5)
Chloride: 104 meq/L (ref 96–112)
Creatinine, Ser: 0.65 mg/dL (ref 0.40–1.20)
GFR: 89.63 mL/min (ref >60.00)
Glucose, Bld: 94 mg/dL (ref 70–99)
Potassium: 3.8 meq/L (ref 3.5–5.1)
Sodium: 143 meq/L (ref 135–145)
Total Bilirubin: 0.3 mg/dL (ref 0.2–1.2)
Total Protein: 6.6 g/dL (ref 6.0–8.3)

## 2024-05-14 LAB — LDL CHOLESTEROL, DIRECT: Direct LDL: 258 mg/dL

## 2024-05-14 LAB — HEMOGLOBIN A1C: Hgb A1c MFr Bld: 5.7 % (ref 4.6–6.5)

## 2024-05-14 MED ORDER — OXYCODONE-ACETAMINOPHEN 5-325 MG PO TABS
1.0000 | ORAL_TABLET | Freq: Four times a day (QID) | ORAL | 0 refills | Status: DC | PRN
Start: 2024-07-08 — End: 2024-08-18

## 2024-05-14 MED ORDER — OXYCODONE-ACETAMINOPHEN 5-325 MG PO TABS
1.0000 | ORAL_TABLET | Freq: Four times a day (QID) | ORAL | 0 refills | Status: AC | PRN
Start: 2024-08-07 — End: ?

## 2024-05-14 MED ORDER — OTEZLA 4 X 10 & 51 X20 MG PO TBPK
10.0000 mg | ORAL_TABLET | Freq: Every day | ORAL | Status: AC
Start: 2024-05-14 — End: ?

## 2024-05-14 MED ORDER — OXYCODONE-ACETAMINOPHEN 5-325 MG PO TABS: 1.0000 | ORAL_TABLET | Freq: Four times a day (QID) | ORAL | 0 refills | Status: DC | PRN

## 2024-05-14 NOTE — Assessment & Plan Note (Signed)
 Managed by [facility]..  currently taking celebrex  , arava and 10 mg prednisone  daily. Pain management with oxycodone  /APAP . Refill history confirmed via [place] Controlled Substance databas, accessed by me today.[name]

## 2024-05-14 NOTE — Assessment & Plan Note (Signed)
 No improvement post  ESI by Herbert.[name]  getting a second opinion .

## 2024-05-15 ENCOUNTER — Ambulatory Visit: Payer: Self-pay | Admitting: Internal Medicine

## 2024-05-16 ENCOUNTER — Encounter: Payer: Self-pay | Admitting: Internal Medicine

## 2024-05-19 ENCOUNTER — Ambulatory Visit: Admitting: Orthopedic Surgery

## 2024-05-19 DIAGNOSIS — G5602 Carpal tunnel syndrome, left upper limb: Secondary | ICD-10-CM | POA: Diagnosis not present

## 2024-05-19 NOTE — Progress Notes (Signed)
 Madeline Young - 68 y.o. female MRN [id number]  Date of birth: 11-28-1955  Office Visit Note: Visit Date: 05/19/2024 PCP: Marylynn Verneita CROME, MD Referred by: Claudene Penne ORN, MD  Subjective: No chief complaint on file.  HPI: Madeline Young is a pleasant 68 y.o. female who presents today for ongoing left hand discomfort with associated numbness and tingling that is been present for multiple years.  Also does have ongoing deformity at the basilar aspect of the left thumb with hyperextension at the MP region.  Developing similar deformity of the right thumb.  She has been able to activity modify and tolerate the thumb deformity, her major issue today is the ongoing numbness and tingling of the left hand.  She has trialed bracing and prior injections for nocturnal symptoms as well of the left hand, symptoms have been refractory to conservative care.  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  She does describe a recent left wrist injury which did exacerbate her symptoms.  Pertinent ROS were reviewed with the patient and found to be negative unless otherwise specified above in HPI.   Visit Reason:Left hand pain with associated numbness and tingling Duration of symptoms:pain for years Hand dominance: right Occupation:Retired Diabetic: No Smoking: No Heart/Lung History:Carotid artery stenosis, asymptomatic, bilateral  Blood Thinners: aspirin  Prior Testing/EMG:EMG demonstrated left carpal tunnel, moderate/severe Injections none Treatments:none Prior Surgery:none  Assessment & Plan: Visit Diagnoses:  1. Carpal tunnel syndrome, left upper limb     Plan: Extensive discussion was had with the patient today about her ongoing left sided carpal tunnel syndrome that is refractory to conservative care.  Patient has both clinical and electrodiagnostic evidence to confirm this diagnosis.  At this juncture, she is  indicated for left open carpal tunnel release.  Risks and benefits of operations were discussed in detail today.  Understanding all risks and benefits, patient would like to have surgery done in the form of left open carpal tunnel release under IV sedation.  Risks include but not limited to infection, bleeding, scarring, stiffness, nerve injury or vascular, tendon injury, risk of recurrence and need for subsequent operation were all discussed in detail.  Patient consented understanding the above.  Will move forward surgical scheduling.  We also did discuss the underlying thumb basilar joint arthritis with associated MP hyperextension.  Given that she has been able to accommodate this currently with minimal pain, she is not interested in any aggressive treatment.  We also did discuss the underlying SLAC arthritis of the left wrist, she expressed full understanding.   Follow-up: No follow-ups on file.   Meds & Orders: No orders of the defined types were placed in this encounter.  No orders of the defined types were placed in this encounter.    Procedures: No procedures performed      Clinical History: No specialty comments available.  She reports that she quit smoking about 52 years ago. Her smoking use included cigarettes. She has never been exposed to tobacco smoke. She has never used smokeless tobacco.  Recent Labs    05/13/24 1442  HGBA1C 5.7    Objective:   Vital Signs: There were no vitals taken for this visit.  Physical Exam  Gen: Well-appearing, in no acute distress; non-toxic CV: Regular Rate. Well-perfused. Warm.  Resp: Breathing unlabored on room air; no wheezing. Psych: Fluid speech in conversation; appropriate affect; normal thought process  Ortho Exam PHYSICAL EXAM:  General: Patient is well appearing and in no distress.   Skin and Muscle: No significant skin changes are apparent to upper extremities.   Range of Motion and Palpation Tests: Mobility is full  about the elbows with flexion and extension.  Wrist flexion/extension is 55/45 bilaterally.  Thumb opposition is limited secondary to Z deformity.  Moderate tenderness over the thumb CMC articulation bilateral is observed.  Notable MCP hyperextension of the bilateral thumbs is observed.    Neurologic, Vascular, Motor: Sensation is diminished to light touch in the left median nerve distribution.    Thenar atrophy: Moderate left Tinel sign: Positive left Carpal tunnel compression: Positive left Phalen test: Positive left  Motor left hand APB: 4/5, thumb opposition to ring finger PIP  Fingers pink and well perfused.  Capillary refill is brisk.     Lab Results  Component Value Date   HGBA1C 5.7 05/13/2024     Imaging: No results found.  Past Medical/Family/Surgical/Social History: Medications & Allergies reviewed per EMR, new medications updated. Patient Active Problem List   Diagnosis Date Noted   Skin lesion of left lower extremity 04/21/2024   History of urinary incontinence 04/21/2024   Spondylosis without myelopathy or radiculopathy, lumbosacral region 02/26/2024   Lumbar facet joint pain 01/22/2024   Prophylactic antibiotic 01/08/2024   Latex precautions, history of latex allergy 01/07/2024   At high risk for allergic reaction to latex 01/07/2024   Fusion of lumbar spine 12/19/2023   Pain from implanted hardware 12/19/2023   Skin ulcer, limited to breakdown of skin (HCC) 12/17/2023   Lower extremity edema 12/17/2023   Cellulitis of leg without foot, left 12/07/2023   Chronic pain syndrome 11/28/2023   Pharmacologic therapy 11/28/2023   Disorder of skeletal system 11/28/2023   Problems influencing health status 11/28/2023   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/28/2023   Chronic low back pain (1ry area of Pain) (Bilateral) (R>L) w/o sciatica 11/28/2023   Chronic hip pain (2ry area of Pain) (Bilateral) (R>L) 11/28/2023   Decreased range of motion  of hips (Bilateral) 11/28/2023   Low back pain of over 3 months duration 11/28/2023   Low back pain radiating to legs (Bilateral) 11/28/2023   Leg swelling 11/28/2023   Abnormal CT myelogram of lumbar spine (02/15/2023) 11/28/2023   Degenerative joint disease involving multiple joints 11/28/2023   Abnormal NCS (nerve conduction studies) ([facility]) (09/27/2023) 11/28/2023   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/2023   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) 11/28/2023   Lumbosacral radiculopathy at L5 (Right) by EMG 11/28/2023   Lumbosacral radiculopathy at S1 (Right) by EMG 11/28/2023   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 by (08/29/2023) DEXA Scan 11/28/2023   Abnormal MRI, lumbar spine (04/19/2023) ([facility]) 11/28/2023   Discogenic low back pain 11/28/2023   Lumbar Foraminal stenosis 11/28/2023   Lumbar DDD w/ LBP & LEP 11/28/2023   Osteopenia 11/13/2023   Carpal tunnel syndrome (Bilateral) by EMG 11/13/2023   Sore throat 10/26/2023   Plaque psoriasis 10/26/2023   Unintentional Tylenol  overdose, sequela 08/14/2023   Chronic pain not due to malignancy 08/14/2023   Slac (scapholunate advanced collapse) of wrist, left 06/03/2023   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   Spinal stenosis at L4-L5 level 04/17/2023   Thoracic radiculopathy 04/17/2023   Urge incontinence 05/16/2022   Onychomycosis 08/17/2021   Drug allergy 06/29/2021   Fibromyalgia 06/29/2021   Generalized osteoarthritis 06/29/2021   Hand joint pain 06/29/2021   Other intervertebral disc degeneration, lumbar region 06/29/2021   Other long term (  current) drug therapy 06/29/2021   Psoriatic arthritis (HCC) 06/29/2021   Sciatica 01/28/2020   Rhinitis, non-allergic 11/10/2019   Insomnia 08/10/2019   Anxiety 10/11/2018   Acquired scoliosis 08/13/2018   Cervical spondylosis without myelopathy 07/26/2017   Pseudoarthrosis of lumbar spine 07/26/2017   Pain in thoracic spine 03/28/2017   Carotid artery stenosis, asymptomatic,  bilateral 10/29/2016   ADD (attention deficit disorder) 10/29/2016   Chronic lumbar radiculopathy (S1>L5) (Right) by EMG 05/10/2016   Lumbar facet Arthropathy 10/22/2015   Dextroscoliosis of lumbar spine 10/22/2015   Spondylolisthesis of lumbar region 07/16/2015   Spondylolisthesis 07/16/2015   Globus sensation since at least 2005 04/29/2015   Lumbar stenosis with neurogenic claudication 03/26/2015   Lumbar radiculitis 03/26/2015   GERD (gastroesophageal reflux disease) 11/17/2014   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   Travel advice encounter 01/27/2013   Multinodular goiter (nontoxic) 08/11/2012   Overweight (BMI 25.0-29.9) 08/11/2012   Screening for cervical cancer 08/21/2011   Screening for breast cancer 08/21/2011   Hx of adenomatous polyp of colon 10/06/2008   Familial hyperlipidemia 09/15/2008   Major depressive disorder, single episode, in remission (HCC) 09/15/2008   Allergic rhinitis 09/15/2008   Past Medical History:  Diagnosis Date   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   ADHD (attention deficit hyperactivity disorder)    Allergy    Anxiety    Arthritis    Cataract    bil cateracts removed   Complication of anesthesia    first colonoscopy pt states she woke up   Depression    GERD (gastroesophageal reflux disease)    Globus sensation    Hyperlipidemia    Incontinence    bowels at times   Insomnia    Multinodular goiter (nontoxic)    Psoriatic arthritis (HCC)    Sjogren's syndrome (HCC) 11/2023   secondary   Spinal stenosis of lumbar region 03/10/2015   MRI    Statin intolerance 01/27/2013   Family History  Problem Relation Age of Onset   Heart disease Mother    Hyperlipidemia Mother    Coronary artery disease Father 37       CABG in early 21's   Hyperlipidemia Father    Epilepsy Grandchild        Severe form   Colon cancer Neg Hx    Esophageal cancer Neg Hx    Rectal cancer Neg Hx    Stomach cancer Neg Hx    Colon polyps Neg Hx    Crohn's disease Neg Hx     Past Surgical History:  Procedure Laterality Date   BACK SURGERY  02/17/2016   Dr. Juliane- L4-L5 fusion    BIOPSY THYROID   2014   COLONOSCOPY     COLONOSCOPY W/ POLYPECTOMY     EYE SURGERY     bilateral cataract surgery w/ lens implant   FOOT SURGERY  2006   Right foot , secondary to severe loss of joint [facility])   POLYPECTOMY     TONSILLECTOMY     UPPER GASTROINTESTINAL ENDOSCOPY  2005   With empiric esophageal dilation   Social History   Occupational History   Occupation: disability  Tobacco Use   Smoking status: Former    Current packs/day: 0.00    Types: Cigarettes    Quit date: 08/21/1971    Years since quitting: 52.7    Passive exposure: Never   Smokeless tobacco: Never  Vaping Use   Vaping status: Never Used  Substance and Sexual Activity   Alcohol  use: Yes  Alcohol /week: 7.0 standard drinks of alcohol     Types: 7 Glasses of wine per week    Comment: daily,wine   Drug use: No   Sexual activity: Not on file    Stephaniemarie Stoffel Estela) Arlinda, M.D. [facility], Hand Surgery

## 2024-05-20 ENCOUNTER — Other Ambulatory Visit (HOSPITAL_COMMUNITY): Payer: Self-pay

## 2024-05-20 ENCOUNTER — Telehealth: Payer: Self-pay

## 2024-05-22 ENCOUNTER — Telehealth: Payer: Self-pay | Admitting: Psychiatry

## 2024-05-22 NOTE — Telephone Encounter (Signed)
 Pt LVM @ 3:15p requesting refill of Vyvanse  70mg  to   [id number] [address], [facility] [address], [place] [address] Phone: [phone]  Fax: [phone]   She said she has 2 days left. Next appt 9/3

## 2024-05-22 NOTE — Telephone Encounter (Signed)
 LF 7/21, due 8/18

## 2024-05-23 NOTE — Telephone Encounter (Signed)
 Called patient to let her know that RF wasn't due until Monday. She said she found a couple more pills. She wants me to send in 3 RF, but she has FU 9/3.

## 2024-05-26 ENCOUNTER — Other Ambulatory Visit (HOSPITAL_COMMUNITY): Payer: Self-pay

## 2024-05-26 ENCOUNTER — Other Ambulatory Visit: Payer: Self-pay

## 2024-05-26 DIAGNOSIS — F900 Attention-deficit hyperactivity disorder, predominantly inattentive type: Secondary | ICD-10-CM

## 2024-05-26 MED ORDER — LISDEXAMFETAMINE DIMESYLATE 70 MG PO CAPS: 70.0000 mg | ORAL_CAPSULE | Freq: Every day | ORAL | 0 refills | Status: DC

## 2024-05-26 NOTE — Telephone Encounter (Signed)
 Pended

## 2024-05-26 NOTE — Telephone Encounter (Signed)
 Pt called and said that she is leaving tomorrow to go out of town and needs to pick up the medicine today

## 2024-05-29 NOTE — Telephone Encounter (Signed)
 Error

## 2024-06-03 ENCOUNTER — Telehealth: Payer: Self-pay

## 2024-06-03 NOTE — Telephone Encounter (Signed)
 Pt needs a PA for Apremilast  (OTEZLA ) 4 x 10 & 51 x20 MG TBPK

## 2024-06-05 ENCOUNTER — Other Ambulatory Visit (HOSPITAL_COMMUNITY): Payer: Self-pay

## 2024-06-05 ENCOUNTER — Telehealth: Payer: Self-pay

## 2024-06-05 NOTE — Telephone Encounter (Signed)
 Pharmacy Patient Advocate Encounter   Received notification from Physician's Office that prior authorization for Otezla  4 x 10 & 51 x20MG  tablets is required/requested.   Insurance verification completed.   The patient is insured through [employer] .   Per test claim: PA required; PA started via CoverMyMeds. KEY [id number] . Waiting for clinical questions to populate.

## 2024-06-05 NOTE — Telephone Encounter (Signed)
 PA request has been Started. New Encounter has been or will be created for follow up. For additional info see Pharmacy Prior Auth telephone encounter from 06/05/2024.

## 2024-06-05 NOTE — Telephone Encounter (Signed)
 Noted

## 2024-06-06 NOTE — Telephone Encounter (Signed)
 Copied from CRM #[id number]. Topic: Clinical - Medication Question >> Jun 05, 2024  5:15 PM Drema MATSU wrote: Reason for CRM: Madeline Young is calling regarding a medication request approval for Apremilast  (OTEZLA ) 4 x 10 & 51 x20 MG TBPK. She stated that it has been approved through Medicare Part D. She will also be sending information by fax. Date of approval: 06/05/24-06/05/25

## 2024-06-06 NOTE — Telephone Encounter (Signed)
 Copied from CRM [id number]. Topic: Clinical - Prescription Issue >> Jun 06, 2024 11:12 AM Roselie BROCKS wrote: Reason for CRM: [facility] needs prior authorization for Otezla  4 x 10 & 51 x20MG  tablets .[facility] return number, [phone]

## 2024-06-10 ENCOUNTER — Other Ambulatory Visit (HOSPITAL_COMMUNITY): Payer: Self-pay

## 2024-06-10 NOTE — Telephone Encounter (Signed)
 Pharmacy Patient Advocate Encounter  Received notification from [facility] that Prior Authorization for Otezla  4 x 10 & 51 x20MG  tablets  has been APPROVED from 06/05/2024 to 06/05/2025. Unable to obtain price due to refill too soon rejection, last fill date 06/06/2024 next available fill date 07/05/2024   PA #/Case ID/Reference #: [employer]

## 2024-06-11 ENCOUNTER — Ambulatory Visit: Admitting: Psychiatry

## 2024-06-11 ENCOUNTER — Encounter: Payer: Self-pay | Admitting: Psychiatry

## 2024-06-11 DIAGNOSIS — F3341 Major depressive disorder, recurrent, in partial remission: Secondary | ICD-10-CM

## 2024-06-11 DIAGNOSIS — G8929 Other chronic pain: Secondary | ICD-10-CM

## 2024-06-11 DIAGNOSIS — M5489 Other dorsalgia: Secondary | ICD-10-CM | POA: Diagnosis not present

## 2024-06-11 DIAGNOSIS — F900 Attention-deficit hyperactivity disorder, predominantly inattentive type: Secondary | ICD-10-CM | POA: Diagnosis not present

## 2024-06-11 DIAGNOSIS — F5105 Insomnia due to other mental disorder: Secondary | ICD-10-CM | POA: Diagnosis not present

## 2024-06-11 MED ORDER — LISDEXAMFETAMINE DIMESYLATE 70 MG PO CAPS: 70.0000 mg | ORAL_CAPSULE | Freq: Every day | ORAL | 0 refills | Status: DC

## 2024-06-11 MED ORDER — DULOXETINE HCL 60 MG PO CPEP: 120.0000 mg | ORAL_CAPSULE | Freq: Every day | ORAL | 1 refills | Status: DC

## 2024-06-11 NOTE — Progress Notes (Signed)
 Madeline Young [id number] 05/02/1956 68 y.o.   Subjective:   Patient ID:  Madeline Young is a 68 y.o. (DOB January 23, 1956) female.  Chief Complaint:  Chief Complaint  Patient presents with   Follow-up   Depression   ADD   Anxiety   Sleeping Problem    Madeline Young presents to the office today for follow-up of ADD and anxiety and depression and sleep.  seen August 2020.  No meds were changed.  On Vyvanse  50, fluoxetine  20, and Ambien  5.  seen November 30, 2019.  The following was noted: Down.  Doesn't feel Vyvanse  helping as much.  More scattered and doesn't finish things.  No SE with it. Sleep pattern is worse as Covid progressed.  Had reduced Zolpidem  to 5 mg but then increased to 7.5 mg HS.  If takes Benadryl but can sleep but then has crying spells after it and hangover.  In the AM feels more negative and tearful.  AM is hard bc stiff in the morning. Plan:Continue Vyvanse  50 mg AM Increase fluoxetine  to 30 mg daily.  She doesn't want to go to 40 bc fear of serotonin syndrome.  Thinks she had some of those sx in the past Continue zolpidem  5 mg daily.  01/29/20 appt, reported:  Better with increased fluoxetine  re: mood. No SE with meds. Still ADD problems not as well controlled as she'd like. Still problems with sleep and doesn't think ambien  workiing as well as it should.  Tried trazodone  with Ambien  but had hangover and difficulty waking up and not a deep good sleep.  Never tried trazodone  alone.  Increased Ambien  on her own to 10 mg and ran out early.  Reduced to 5 mg and added Benadryl.  Erratic sleep since Covid.  Go to bed same time 10.  Avoids alcohol  2 hours before. Primary problem is going to sleep.  History of 10 mg Ambien  for years.  Says 7.5 mg Ambien  will work. Caffeine none after noon. Plan: Increase Vyvanse  60 mg AM continue fluoxetine  to 30 mg daily.  She doesn't want to go to 40 bc fear of serotonin syndrome.  Thinks she had some of those sx in the  past.  It helped to increase Increase zolpidem  5-10 mg daily.  08/02/2020 appointment with the following noted: Went back down to 20 mg fluoxetine  bc of more HA and fear of serotonin syndrome bc she feels she had it at some point in the past.  Taking 20 mg for mos. Still problems with sleep.  Down to 2.5 mg Ambien  bc when takes more feels out of it the next day.   Thinks taking Ambien  too long. Too much awakening. No marked benefit with increased Vyvanse  to 60 mg and feels more irritable and edgy. However still distractible and inefficient and hard to finish things. Forgetful and loses things. Plan: Reduce Vyvanse  to 50 mg AM continue fluoxetine  to 20 mg daily DT her fear of serotonin syndrome.  Thinks she had some of those sx in the past.  May have reduced benefit DT lower dose. Trial Belsomra 15-20 mg or Dayvigo for sleep instead of Ambien  bc hangover with 5 mg daily.  11/01/2020 appointment with the following noted: Phone call 08/16/2020:Rtc to patient and she did try the Belsomra and reports having the hang over and drowsy effect. Same as the trazodone .  She is asking to try Lunesta  instead.  10/07/2020 she called back again stating she is wanted to go back to Ambien  5 mg.  Ambien  seems to work again and getting 8 hours now. Re: reduction vyvanse  to 50 mg.  Now feels that it's now working well.  Very distractible and not finishing things.  Not affordable to take the biologicals any more and had to stop but pain is not worse. Plan increase Vyvanse  to 60 mg daily. For sleep return to Ambien  5 mg nightly because of failures of alternatives.  03/08/2021 appointment with the following noted: Doing good overall.   Taking 7.5 mg Ambien .  Need to sleep and satisfied with it now. Overall in a good place emotionally and mentally.  Satisfied with meds. No SE now. Tolerated Vyvanse  60 fine and pleased with it.   Prednisone  back a couple of weeks ago.  Pain can awaken her also. Pleased to sell  property. Plan: no med changes  09/07/2021 appointment with the following noted: Worst couple of mos.   July flair up of arthritis, the worst ever related to stress, the heat and over exertion. Took 5 mos to see new rheumatologist and sees them tomorrow. Got more down with pain. On prednisone  it helped.  On 20 mg prednisone  had irritability and ADD worse and insomnia.  Forgetful, distractible to marked degree.  Trouble making decisions.  Rewriting lists of things to do wasting time.  Better cognition with less prednisone .  Would rather hurt than be like this. Almost nonfunctional at this time.  Always doing things.   Having BA remodeled.  Sleep 6-7 hours. Plan: Yes trial Namenda  off label. continue fluoxetine  to 20 mg daily DT her fear of serotonin syndrome.  Thinks she had some of those sx in the past.  May have reduced benefit DT lower dose. Per her request to increase Vyvanse  to 70 mg every morning for better benefit for cognition.  11/10/2021 appointment with the following noted: Not on Namenda  at this time. New rheum dx CPPD and wondering if she had psoriatic arthritis and wanted to try new drugs. Lives in historic house with a lot of stairs.  At [facility] with more problems with joints.  Worry over new doctor and new dx with FU MRI 2 weeks ago.  Worst flare up ever and took her down mentally.  Pain interfered with sleep.  Missed 2 mos of hair appts.  Couldn't function DT pain.Need to restart prednisone  and get injections from Dr. Patricio yesterday.  Prednisone  helped. Thought Namenda  helped some but caused dizziness.  Stopped it. Had gotten down to 1/4 Ambien  and melatonin but prednisone  increased insomnia. Back on 5 mg Ambien . Inflammatory flareup if overworks. Got mild Covid and H Covid with no sx. Health stress and health care issues. Still all over the place. Plan no med chages  05/15/2022 appointment noted: Taking Vyvanse  60 bc can't get 70's Overall doing well lately.  [place] and did [place]. GD born early [place] beach and she went to help them.  Born at 34 weeks and GI issues.   D from [place] came to visit for a couple of weeks.   RA flare up.  Ongoing problem. Mood is ok.    11/16/22 appt noted: Ongoing med px. No med changes Vyvanse  70 AM, fluoxetine  20 mg daily Renovation triggering bursitis affecting sleep DT pain and psoriasis flares.  Going to [facility] Arthritis is severe and problematic.   Stress medical bills. Not markedly depressed but down over medical problems with severe arthritis. Some memory issues.  Not severe.   Still starts things she doesn't  finish. No SE. D last baby born premature.   Plan no changes  05/17/23 appt noted:  also disc with D Anna Meds as above No SE A lot has happened.  Infusions for psoriatic arthritis.  Noticed more joint and muscle pain but tried 3 and got worse.  CBP got worse.  Some sciatica.    Hx fusion 2017.   Got so bad had to use walker. Dx scoliosis and severe disc bulging.  Was told not a surgical candidate by prior surgeon.  2nd opinion in [facility] said same thing.  In constant pain and cry all the time.   Asks about switch to modafinil . D noticed before pain meds, still spacey, disorganized, irritable.  Wonders vyvanse  is causing some of these SE.   Plan: Ok switch to modafinil  100 mg AM for 1 week then 200 mg and DC Vyvanse . Switch duloxetine  to 60 mg daily.  For mood and potentially for pain.  Able to stop Ambien .  Using melatonin and CBD gummy  06/01/23 TC about fears of taking duloxetine .  She will decide whether to take it or not.    07/20/23 TC:  Pt was called @ 10:20a for follow up appt because of the recent refill request from the pharmacy.  She said the bottle she has has Duloxetine  HCL which is different than the one she was taking.  She also states she went from 30mg  to 60mg  to 90mg .  She wants someone to call her back confirm the medication and the dosage.     Patient reports that  she is taking 120 mg of duloxetine  and has for about 1-1/2 to 2 weeks.  Rx was sent for #90 tablets but one every day. She said you told her that she could take more. Even though it hasn't been that long that she has been taking this dose she feels like she is seeing some benefit and is reporting no SE. She wants to know if she can take two 60 mg tablets together or if she should do AM and PM. She has FU 12/5.     MD resp:  She can take 2 of the duloxetine  60 mg daily if she wants to try it for mood and chronic pain.  Higher doses can help pain.  The SE to look for is excessive sweating.     09/13/23 appt noted:  seen in person. Psych meds: duloxetine  120 AM, modafinil  200 up until a week ago, on oxycodone  5 q 6 hr. Trouble getting into pain clinic.  Multiple joint px including back and others.  Will not get better.  H is 68 yo. Switch to duloxetine  helped a little with pain and mood.  Good change.   Still cry all the time.  Disruptive.   Dealing with so much change in her life worse with holidays. Need to switch back to Vyvanse  for ADD.  Had some left and resumed some Vyvanse  left over a week ago.  Helps her focus and productivity. D moved back to [place].  They bought a house in [place] outside [place].  Other D going through divorce in [place].   GS Bonnie 70 yo is doing relatively well but needs constant care.   Modafinil  helped alertness but not focus. Plan: Auvelity for crying .  1 in AM.  Resume Vyvanse   01/09/24 appt noted: Med: duloxetine  120, Vyvanse  50 AM, (never took Auvelity bc stopped crying), melatonin for sleep Injx back is already helping some but using cane. Recent cellulitis.  Difficult to treat.   Needs to increase vyvanse .   With Vyvanse  mood better bc less frustrated with productivity.  Activity limited by severe arthritis. Asks to take low dose Xanax  for insomnia initial. Plan: Resume Vyvanse  70 mg for ADD and xanax  for sleep  06/11/24 appt noted:  Med: duloxetine   120, Vyvanse  70 AM, (never took Auvelity bc stopped crying), melatonin for sleep, prn Xanax  for sleep, CBD for sleep SE no sig Disc DDI and sleep.   Vyvanse  working better at 70 am. [email or web address].  she is using for sleep which works. Was a little dep for awhile but better this week.   Thinks Cymbalta  less effective than Prozac  for mood.  3-4 weeks was out of sorts and not very motivatied Shots are helping with less pain.  Mor e active.  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  Helping to care for 80-month-old haiti in [place].  Past Psychiatric Medication Trials:  trazodone  hangover, Ambien , belsomra, Dayvigo to expensive, melatonin, Benadryl hangover and combos (Lunesta  never tried) Xanax  hangover Fluoxetine  60, sertraline NR, duloxetine  120, Lexapro, Wellbutrin, buspirone,  Ritalin LA, Concerta 54, Adderall 20 dizzy, Vyvanse  60 Lyrica  sedation  Under care at this office since July 2002   Review of Systems:  Review of Systems  Cardiovascular:  Negative for palpitations.  Musculoskeletal:  Positive for arthralgias, back pain, gait problem and joint swelling.  Skin:  Positive for rash.  Neurological:  Negative for dizziness and tremors.  Psychiatric/Behavioral:  Positive for decreased concentration and sleep disturbance. Negative for agitation, behavioral problems, confusion, dysphoric mood, hallucinations, self-injury and suicidal ideas. The patient is not nervous/anxious and is not hyperactive.     Medications: I have reviewed the patient's current medications.  Current Outpatient Medications  Medication Sig Dispense Refill   acetaminophen  (TYLENOL ) 500 MG tablet Take 1,000 mg by mouth every 6 (six) hours as needed.     ALPRAZolam  (XANAX ) 0.25 MG tablet Take 1 tablet (0.25 mg total) by mouth at bedtime as needed for anxiety. 30 tablet 2   Apremilast  (OTEZLA ) 4 x 10 & 51 x20 MG TBPK Take 10 mg by mouth daily.     aspirin 81 MG tablet Take 81  mg by mouth daily.     b complex vitamins capsule Take 1 capsule by mouth daily.     calcium  carbonate (OS-CAL - DOSED IN MG OF ELEMENTAL CALCIUM ) 1250 (500 Ca) MG tablet      celecoxib  (CELEBREX ) 200 MG capsule Take 1 capsule (200 mg total) by mouth 2 (two) times daily. 180 capsule 1   cholecalciferol (VITAMIN D ) 1000 UNITS tablet Take 2,000 Units by mouth daily.     cyanocobalamin  (VITAMIN B12) 1000 MCG tablet Take 1,000 mcg by mouth daily.     diclofenac  Sodium (VOLTAREN ) 1 % GEL APPLY 2 GRAMS TO AFFECTED AREA 4 TIMES A DAY 300 g 3   fluocinonide ointment (LIDEX) 0.05 % APPLY TWICE DAILY TO AFFECTED AREAS UNTIL IMPROVED THEN AS NEEDED FOR FLARES     leflunomide (ARAVA) 20 MG tablet Take 20 mg by mouth daily.     Magnesium 250 MG CAPS Take by mouth. Take two by mouth daily     melatonin 5 MG TABS Take 5 mg by mouth at bedtime.     Multiple Vitamin (MULTIVITAMIN) tablet Take 1 tablet by mouth daily.     naloxone  (NARCAN ) 0.4 MG/ML injection Take as needed for oversedation 1 mL 3   Omega-3 Fatty Acids (FISH  OIL) 1200 MG CAPS Take 1,200 mg by mouth daily.      [START ON 08/07/2024] oxyCODONE -acetaminophen  (PERCOCET/ROXICET) 5-325 MG tablet Take 1 tablet by mouth every 6 (six) hours as needed for moderate pain (pain score 4-6). Do not refill less than 30 days from prior refill 120 tablet 0   [START ON 07/08/2024] oxyCODONE -acetaminophen  (PERCOCET/ROXICET) 5-325 MG tablet Take 1 tablet by mouth every 6 (six) hours as needed for moderate pain (pain score 4-6). Do not refill less than 30 days from prior refill 120 tablet 0   oxyCODONE -acetaminophen  (PERCOCET/ROXICET) 5-325 MG tablet Take 1 tablet by mouth every 6 (six) hours as needed for moderate pain (pain score 4-6). Do not refill less than 30 days from prior refill 120 tablet 0   Polyethyl Glycol-Propyl Glycol 0.4-0.3 % SOLN Place 1 drop into both eyes daily as needed (for dry eyes).     Probiotic Product (PROBIOTIC DAILY PO) Take 1 capsule by mouth  daily.     solifenacin  (VESICARE ) 10 MG tablet TAKE 1 TABLET BY MOUTH EVERY DAY 90 tablet 1   triamcinolone  cream (KENALOG ) 0.1 % APPLY TO AFFECTED AREA TWICE A DAY UNTIL CLEAR THEN AS NEEDED     DULoxetine  (CYMBALTA ) 60 MG capsule Take 2 capsules (120 mg total) by mouth daily. 180 capsule 1   lisdexamfetamine (VYVANSE ) 70 MG capsule Take 1 capsule (70 mg total) by mouth daily. 90 capsule 0   Current Facility-Administered Medications  Medication Dose Route Frequency Provider Last Rate Last Admin   mupirocin  cream (BACTROBAN ) 2 %   Topical BID         Medication Side Effects: None  Allergies:  Allergies  Allergen Reactions   Cosentyx [Secukinumab] Rash    Severe case of psorasis   Sertraline Hcl Other (See Comments)    Made me crazy    Adalimumab     Other Reaction(s): psoriasis   Avelox [Moxifloxacin Hcl In Nacl]     Pt cant remember reaction   Nitrofurantoin     Other reaction(s): Other (See Comments) tired Pt cant remember reaction Other reaction(s): Unknown   Pregabalin      Cognitive issues   Rosuvastatin     Other reaction(s): Unknown   Latex Itching and Rash    Redness   Macrobid [Nitrofurantoin Monohyd Macro] Other (See Comments)    tired   Statins Other (See Comments)    Muscle aches    Past Medical History:  Diagnosis Date   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   ADHD (attention deficit hyperactivity disorder)    Allergy    Anxiety    Arthritis    Cataract    bil cateracts removed   Complication of anesthesia    first colonoscopy pt states she woke up   Depression    GERD (gastroesophageal reflux disease)    Globus sensation    Hyperlipidemia    Incontinence    bowels at times   Insomnia    Multinodular goiter (nontoxic)    Psoriatic arthritis (HCC)    Sjogren's syndrome (HCC) 11/2023   secondary   Spinal stenosis of lumbar region 03/10/2015   MRI    Statin intolerance 01/27/2013    Family History  Problem Relation Age of Onset   Heart  disease Mother    Hyperlipidemia Mother    Coronary artery disease Father 43       CABG in early 44's   Hyperlipidemia Father    Epilepsy Grandchild  Severe form   Colon cancer Neg Hx    Esophageal cancer Neg Hx    Rectal cancer Neg Hx    Stomach cancer Neg Hx    Colon polyps Neg Hx    Crohn's disease Neg Hx     Social History   Socioeconomic History   Marital status: Married    Spouse name: Not on file   Number of children: 2   Years of education: Not on file   Highest education level: Not on file  Occupational History   Occupation: disability  Tobacco Use   Smoking status: Former    Current packs/day: 0.00    Types: Cigarettes    Quit date: 08/21/1971    Years since quitting: 52.8    Passive exposure: Never   Smokeless tobacco: Never  Vaping Use   Vaping status: Never Used  Substance and Sexual Activity   Alcohol  use: Yes    Alcohol /week: 7.0 standard drinks of alcohol     Types: 7 Glasses of wine per week    Comment: daily,wine   Drug use: No   Sexual activity: Not on file  Other Topics Concern   Not on file  Social History Narrative   Married, retired Charity fundraiser   2 daughters some grandchildren   1 caffeinated drinks daily   1 alcoholic beverage daily   No tobacco      Social Drivers of Corporate investment banker Strain: Low Risk  (08/20/2023)   Overall Financial Resource Strain (CARDIA)    Difficulty of Paying Living Expenses: Not hard at all  Food Insecurity: No Food Insecurity (08/20/2023)   Hunger Vital Sign    Worried About Running Out of Food in the Last Year: Never true    Ran Out of Food in the Last Year: Never true  Transportation Needs: No Transportation Needs (08/20/2023)   PRAPARE - Administrator, Civil Service (Medical): No    Lack of Transportation (Non-Medical): No  Physical Activity: Inactive (08/20/2023)   Exercise Vital Sign    Days of Exercise per Week: 0 days    Minutes of Exercise per Session: 0 min  Stress:  Stress Concern Present (08/20/2023)   Madeline Young of Occupational Health - Occupational Stress Questionnaire    Feeling of Stress : Very much  Social Connections: Moderately Integrated (08/20/2023)   Social Connection and Isolation Panel    Frequency of Communication with Friends and Family: More than three times a week    Frequency of Social Gatherings with Friends and Family: Twice a week    Attends Religious Services: Never    Database administrator or Organizations: Yes    Attends Engineer, structural: More than 4 times per year    Marital Status: Married  Catering manager Violence: Not At Risk (01/03/2024)   Received from [facility]   HITS    Over the last 12 months how often did your partner physically hurt you?: Never    Over the last 12 months how often did your partner insult you or talk down to you?: Never    Over the last 12 months how often did your partner threaten you with physical harm?: Never    Over the last 12 months how often did your partner scream or curse at you?: Never    Past Medical History, Surgical history, Social history, and Family history were reviewed and updated as appropriate.   Please see review of systems for further details on the patient's  review from today.   Objective:   Physical Exam:  There were no vitals taken for this visit.  Physical Exam Constitutional:      General: She is not in acute distress.    Appearance: Normal appearance. She is well-developed.  Musculoskeletal:        General: No deformity.  Neurological:     Mental Status: She is alert and oriented to person, place, and time.     Motor: No tremor.     Gait: Gait normal.     Comments: Using walker  Psychiatric:        Attention and Perception: Perception normal. She is inattentive.        Mood and Affect: Mood is anxious and depressed. Affect is not labile, angry or inappropriate.        Speech: Speech normal. Speech is not slurred.        Behavior:  Behavior normal.        Thought Content: Thought content normal. Thought content is not delusional. Thought content does not include homicidal or suicidal ideation. Thought content does not include suicidal plan.        Cognition and Memory: Cognition normal.        Judgment: Judgment normal.     Comments: Insight intact. No auditory or visual hallucinations. No delusions.  Talkative per usual. Mood less down with duloxetine  but some dep off and on lately. Some chronic ADD     Lab Review:     Component Value Date/Time   NA 143 05/13/2024 1442   K 3.8 05/13/2024 1442   CL 104 05/13/2024 1442   CO2 31 05/13/2024 1442   GLUCOSE 94 05/13/2024 1442   BUN 22 05/13/2024 1442   CREATININE 0.65 05/13/2024 1442   CALCIUM  9.1 05/13/2024 1442   PROT 6.6 05/13/2024 1442   ALBUMIN 4.3 05/13/2024 1442   AST 17 05/13/2024 1442   ALT 18 05/13/2024 1442   ALKPHOS 62 05/13/2024 1442   BILITOT 0.3 05/13/2024 1442   GFRNONAA >60 02/18/2016 0322   GFRAA >60 02/18/2016 0322       Component Value Date/Time   WBC 5.0 12/17/2023 1506   RBC 3.55 (L) 12/17/2023 1506   HGB 11.1 (L) 12/17/2023 1506   HCT 33.7 (L) 12/17/2023 1506   PLT 310.0 12/17/2023 1506   MCV 95.0 12/17/2023 1506   MCH 31.0 02/18/2016 0322   MCHC 32.9 12/17/2023 1506   RDW 13.5 12/17/2023 1506   LYMPHSABS 0.9 05/16/2023 1434   MONOABS 0.5 05/16/2023 1434   EOSABS 0.0 05/16/2023 1434   BASOSABS 0.1 05/16/2023 1434    No results found for: POCLITH, LITHIUM   No results found for: PHENYTOIN, PHENOBARB, VALPROATE, CBMZ   .res Assessment: Plan:    Layliana Milli was seen today for follow-up, depression, add, anxiety and sleeping problem.  Diagnoses and all orders for this visit:  Attention deficit hyperactivity disorder (ADHD), predominantly inattentive type -     lisdexamfetamine (VYVANSE ) 70 MG capsule; Take 1 capsule (70 mg total) by mouth daily.  Depression, major, recurrent, in partial remission  (HCC) -     DULoxetine  (CYMBALTA ) 60 MG capsule; Take 2 capsules (120 mg total) by mouth daily.  Other chronic back pain -     DULoxetine  (CYMBALTA ) 60 MG capsule; Take 2 capsules (120 mg total) by mouth daily.  Insomnia due to mental condition    psoriatic arthritis, OA, and inflammatory   40 min with patient was spent on counseling and coordination  of care. We discussed the following.   ADD not as well controlled. Some mood and pain benefit with duloxetine  120 and tolerating it. No sig crying.   Discussed potential benefits, risks, and side effects of stimulants with patient to include increased heart rate, palpitations, insomnia, increased anxiety, increased irritability, or decreased appetite.  Instructed patient to contact office if experiencing any significant tolerability issues.  Disc risk steroids affecting mood and sleep.  Supportive therapy dealing with stubborn H with memory problems. Disc  rheum consultation and stress from dealing with it.    Helped to Resume Vyvanse  70 mg for ADD  Looking for better focus.  Better mood and pain with Switch duloxetine  to 120 mg daily. For mood and potentially for pain. Cannot go higher.   option Auvelity for residual dep lately .  1 in AM.  Disc SE.  Disc info in detail about how DM can help crying and mood.  She will think about it.   Gave it samples  Asks to go back to alprazolam  0.125 mg for sleep   Consider alternative treatments for ADD including off label.  FU 4-6 mos  Lorene Macintosh, MD, DFAPA   Please see After Visit Summary for patient specific instructions.  Future Appointments  Date Time Provider Department Center  08/18/2024  1:00 PM Marylynn Verneita CROME, MD [facility] [id number]  08/22/2024 10:50 AM [facility] ANNUAL WELLNESS VISIT [facility] [id number]    No orders of the defined types were placed in this encounter.      -------------------------------

## 2024-06-19 ENCOUNTER — Other Ambulatory Visit: Payer: Self-pay

## 2024-06-19 ENCOUNTER — Telehealth: Payer: Self-pay | Admitting: Psychiatry

## 2024-06-19 MED ORDER — AUVELITY 45-105 MG PO TBCR: 1.0000 | EXTENDED_RELEASE_TABLET | ORAL | 0 refills | Status: DC

## 2024-06-19 NOTE — Telephone Encounter (Signed)
 option Auvelity  for residual dep lately .  1 in AM.   Rx sent.

## 2024-06-19 NOTE — Telephone Encounter (Signed)
 Next visit is 11/11/24. Dr. Geoffry has given samples of Auvility 45 mg to [facility]. She is now requesting a new RX for them. Pharmacy is:  [id number] [place], [place] - [address]   Phone: [phone]  Fax: [phone]

## 2024-08-11 ENCOUNTER — Encounter: Payer: Self-pay | Admitting: Radiology

## 2024-08-18 ENCOUNTER — Ambulatory Visit (INDEPENDENT_AMBULATORY_CARE_PROVIDER_SITE_OTHER): Admitting: Internal Medicine

## 2024-08-18 ENCOUNTER — Encounter: Payer: Self-pay | Admitting: Internal Medicine

## 2024-08-18 ENCOUNTER — Other Ambulatory Visit: Payer: Self-pay | Admitting: Internal Medicine

## 2024-08-18 VITALS — BP 150/76 | HR 97 | Ht 65.0 in | Wt 148.4 lb

## 2024-08-18 DIAGNOSIS — G894 Chronic pain syndrome: Secondary | ICD-10-CM

## 2024-08-18 DIAGNOSIS — L400 Psoriasis vulgaris: Secondary | ICD-10-CM | POA: Diagnosis not present

## 2024-08-18 DIAGNOSIS — Z23 Encounter for immunization: Secondary | ICD-10-CM

## 2024-08-18 DIAGNOSIS — M85859 Other specified disorders of bone density and structure, unspecified thigh: Secondary | ICD-10-CM

## 2024-08-18 DIAGNOSIS — E7849 Other hyperlipidemia: Secondary | ICD-10-CM

## 2024-08-18 DIAGNOSIS — L4050 Arthropathic psoriasis, unspecified: Secondary | ICD-10-CM | POA: Diagnosis not present

## 2024-08-18 DIAGNOSIS — G720 Drug-induced myopathy: Secondary | ICD-10-CM

## 2024-08-18 MED ORDER — OXYCODONE-ACETAMINOPHEN 5-325 MG PO TABS: 1.0000 | ORAL_TABLET | Freq: Three times a day (TID) | ORAL | 0 refills | Status: AC | PRN

## 2024-08-18 MED ORDER — EZETIMIBE 10 MG PO TABS: 10.0000 mg | ORAL_TABLET | Freq: Every day | ORAL | 2 refills | Status: AC

## 2024-08-18 MED ORDER — TETANUS-DIPHTH-ACELL PERTUSSIS 5-2-15.5 LF-MCG/0.5 IM SUSP: 0.5000 mL | Freq: Once | INTRAMUSCULAR | 0 refills | Status: AC

## 2024-08-18 NOTE — Progress Notes (Unsigned)
 Subjective:  Patient ID: Madeline Young, female    DOB: [birth year]  Age: 68 y.o. MRN: [id number]  CC: There were no encounter diagnoses.   HPI Madeline Young presents for No chief complaint on file.   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 Wound has healed,    [employer] stater pack was sent to specialty pharmacy at last visit in August.  Patient states that she never received it  was neer called   3) history of severe Left jaw pain near her air with headaches.  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 The new guard did not cover the same number of upper teeth and in the one month interim the Upper incisor broke, referred for implant but space too narrow .    DR Melba also injects both TMJ joints with her own spun blood the headache has resolved.     Outpatient Medications Prior to Visit  Medication Sig Dispense Refill   Dextromethorphan-buPROPion ER (AUVELITY ) 45-105 MG TBCR Take 1 tablet by mouth every morning. 30 tablet 0   acetaminophen  (TYLENOL ) 500 MG tablet Take 1,000 mg by mouth every 6 (six) hours as needed.     ALPRAZolam  (XANAX ) 0.25 MG tablet Take 1 tablet (0.25 mg total) by mouth at bedtime as needed for anxiety. 30 tablet 2   Apremilast  (OTEZLA ) 4 x 10 & 51 x20 MG TBPK Take 10 mg by mouth daily.     aspirin 81 MG tablet Take 81 mg by mouth daily.     b complex vitamins capsule Take 1 capsule by mouth daily.     calcium  carbonate (OS-CAL - DOSED IN MG OF ELEMENTAL CALCIUM ) 1250 (500 Ca) MG tablet      celecoxib  (CELEBREX ) 200 MG capsule Take 1 capsule (200 mg total) by mouth 2 (two) times daily. 180 capsule 1   cholecalciferol (VITAMIN D ) 1000 UNITS tablet Take 2,000 Units by mouth daily.     cyanocobalamin  (VITAMIN B12) 1000  MCG tablet Take 1,000 mcg by mouth daily.     diclofenac  Sodium (VOLTAREN ) 1 % GEL APPLY 2 GRAMS TO AFFECTED AREA 4 TIMES A DAY 300 g 3   DULoxetine  (CYMBALTA ) 60 MG capsule Take 2 capsules (120 mg total) by mouth daily. 180 capsule 1   fluocinonide ointment (LIDEX) 0.05 % APPLY TWICE DAILY TO AFFECTED AREAS UNTIL IMPROVED THEN AS NEEDED FOR FLARES     leflunomide (ARAVA) 20 MG tablet Take 20 mg by mouth daily.     lisdexamfetamine (VYVANSE ) 70 MG capsule Take 1 capsule (70 mg total) by mouth daily. 90 capsule 0   Magnesium 250 MG CAPS Take by mouth. Take two by mouth daily     melatonin 5 MG TABS Take 5 mg by mouth at bedtime.     Multiple Vitamin (MULTIVITAMIN) tablet Take 1 tablet by mouth daily.     naloxone  (NARCAN ) 0.4 MG/ML injection Take as needed for oversedation 1 mL 3   Omega-3 Fatty Acids (FISH OIL) 1200 MG CAPS Take 1,200 mg by mouth daily.      oxyCODONE -acetaminophen  (PERCOCET/ROXICET) 5-325 MG tablet Take 1 tablet by mouth every 6 (six) hours as needed for moderate pain (pain score  4-6). Do not refill less than 30 days from prior refill 120 tablet 0   oxyCODONE -acetaminophen  (PERCOCET/ROXICET) 5-325 MG tablet Take 1 tablet by mouth every 6 (six) hours as needed for moderate pain (pain score 4-6). Do not refill less than 30 days from prior refill 120 tablet 0   oxyCODONE -acetaminophen  (PERCOCET/ROXICET) 5-325 MG tablet Take 1 tablet by mouth every 6 (six) hours as needed for moderate pain (pain score 4-6). Do not refill less than 30 days from prior refill 120 tablet 0   Polyethyl Glycol-Propyl Glycol 0.4-0.3 % SOLN Place 1 drop into both eyes daily as needed (for dry eyes).     Probiotic Product (PROBIOTIC DAILY PO) Take 1 capsule by mouth daily.     solifenacin  (VESICARE ) 10 MG tablet TAKE 1 TABLET BY MOUTH EVERY DAY 90 tablet 1   triamcinolone  cream (KENALOG ) 0.1 % APPLY TO AFFECTED AREA TWICE A DAY UNTIL CLEAR THEN AS NEEDED     Facility-Administered Medications Prior to Visit   Medication Dose Route Frequency Provider Last Rate Last Admin   mupirocin  cream (BACTROBAN ) 2 %   Topical BID         Review of Systems;  Patient denies headache, fevers, malaise, unintentional weight loss, skin rash, eye pain, sinus congestion and sinus pain, sore throat, dysphagia,  hemoptysis , cough, dyspnea, wheezing, chest pain, palpitations, orthopnea, edema, abdominal pain, nausea, melena, diarrhea, constipation, flank pain, dysuria, hematuria, urinary  Frequency, nocturia, numbness, tingling, seizures,  Focal weakness, Loss of consciousness,  Tremor, insomnia, depression, anxiety, and suicidal ideation.      Objective:  There were no vitals taken for this visit.  BP Readings from Last 3 Encounters:  05/13/24 124/60  04/21/24 138/86  02/27/24 (!) 155/79    Wt Readings from Last 3 Encounters:  05/13/24 148 lb 3.2 oz (67.2 kg)  04/21/24 143 lb (64.9 kg)  02/27/24 145 lb 9.6 oz (66 kg)    Physical Exam  Lab Results  Component Value Date   HGBA1C 5.7 05/13/2024   HGBA1C 6.1 01/31/2022    Lab Results  Component Value Date   CREATININE 0.65 05/13/2024   CREATININE 0.75 11/13/2023   CREATININE 0.75 05/16/2023    Lab Results  Component Value Date   WBC 5.0 12/17/2023   HGB 11.1 (L) 12/17/2023   HCT 33.7 (L) 12/17/2023   PLT 310.0 12/17/2023   GLUCOSE 94 05/13/2024   CHOL 356 (H) 05/13/2024   TRIG 155.0 (H) 05/13/2024   HDL 56.60 05/13/2024   LDLDIRECT 258.0 05/13/2024   LDLCALC 268 (H) 05/13/2024   ALT 18 05/13/2024   AST 17 05/13/2024   NA 143 05/13/2024   K 3.8 05/13/2024   CL 104 05/13/2024   CREATININE 0.65 05/13/2024   BUN 22 05/13/2024   CO2 31 05/13/2024   TSH 0.77 11/13/2023   INR 1.0 02/07/2023   HGBA1C 5.7 05/13/2024    VAS US  LOWER EXTREMITY VENOUS REFLUX Result Date: 02/28/2024  Lower Venous Reflux Study Patient Name:  Madeline Young  Date of Exam:   02/27/2024 Medical Rec #: [id number]           Accession #:    [id number] Date of  Birth: [birth year]           Patient Gender: F Patient Age:   89 years Exam Location:  [facility] Procedure:      VAS US  LOWER EXTREMITY VENOUS REFLUX Referring Phys: ORVIN DARING --------------------------------------------------------------------------------  Performing Technologist: Leafy Gibes RVS  Examination  Guidelines: A complete evaluation includes B-mode imaging, spectral Doppler, color Doppler, and power Doppler as needed of all accessible portions of each vessel. Bilateral testing is considered an integral part of a complete examination. Limited examinations for reoccurring indications may be performed as noted. The reflux portion of the exam is performed with the patient in reverse Trendelenburg. Significant venous reflux is defined as >500 ms in the superficial venous system, and >1 second in the deep venous system.  Venous Reflux Times +--------------+---------+------+-----------+------------+--------+ RIGHT         Reflux NoRefluxReflux TimeDiameter cmsComments                         Yes                                  +--------------+---------+------+-----------+------------+--------+ CFV           no                                             +--------------+---------+------+-----------+------------+--------+ FV prox       no                                             +--------------+---------+------+-----------+------------+--------+ FV mid        no                                             +--------------+---------+------+-----------+------------+--------+ FV dist       no                                             +--------------+---------+------+-----------+------------+--------+ Popliteal     no                                             +--------------+---------+------+-----------+------------+--------+ GSV at SFJ    no                            .50               +--------------+---------+------+-----------+------------+--------+ GSV prox thighno                            .37              +--------------+---------+------+-----------+------------+--------+ GSV mid thigh no                            .23              +--------------+---------+------+-----------+------------+--------+ GSV dist thighno                            .25              +--------------+---------+------+-----------+------------+--------+  GSV at knee   no                            .21              +--------------+---------+------+-----------+------------+--------+ GSV prox calf no                            .19              +--------------+---------+------+-----------+------------+--------+ SSV Pop Fossa no                            .24              +--------------+---------+------+-----------+------------+--------+  +--------------+---------+------+-----------+------------+--------+ LEFT          Reflux NoRefluxReflux TimeDiameter cmsComments                         Yes                                  +--------------+---------+------+-----------+------------+--------+ CFV           no                                             +--------------+---------+------+-----------+------------+--------+ FV prox       no                                             +--------------+---------+------+-----------+------------+--------+ FV mid        no                                             +--------------+---------+------+-----------+------------+--------+ FV dist       no                                             +--------------+---------+------+-----------+------------+--------+ Popliteal     no                                             +--------------+---------+------+-----------+------------+--------+ GSV at SFJ    no                            .43               +--------------+---------+------+-----------+------------+--------+ GSV prox thighno                            .41              +--------------+---------+------+-----------+------------+--------+ GSV mid thigh no                            .  27              +--------------+---------+------+-----------+------------+--------+ GSV dist thighno                            .21              +--------------+---------+------+-----------+------------+--------+ GSV at knee   no                            .21              +--------------+---------+------+-----------+------------+--------+ GSV prox calf no                            .21              +--------------+---------+------+-----------+------------+--------+ SSV Pop Fossa no                            .24              +--------------+---------+------+-----------+------------+--------+   Summary: Bilateral: - No evidence of deep vein thrombosis seen in the lower extremities, bilaterally, from the common femoral through the popliteal veins. - No evidence of superficial venous thrombosis in the lower extremities, bilaterally. - No evidence of deep venous insufficiency seen bilaterally in the lower extremity. - No evidence of superficial venous reflux seen in the greater saphenous veins bilaterally. - No evidence of superficial venous reflux seen in the short saphenous veins bilaterally.  Right: - The PTA and ATA was Imaged at the level of the Ankle; Biphasic Waveforms seen.  Left: - The PTA and ATA was Imaged at the level of the Ankle; Triphasic Waveforms seen.  *See table(s) above for measurements and observations. Electronically signed by Selinda Gu MD on 02/28/2024 at 8:13:18 AM.    Final    DG PAIN CLINIC C-ARM 1-60 MIN NO REPORT Result Date: 02/26/2024 Fluoro was used, but no Radiologist interpretation will be provided. Please refer to NOTES tab for provider progress note.   Assessment & Plan:  .There are no diagnoses  linked to this encounter.   I spent 34 minutes on the day of this face to face encounter reviewing patient's  most recent visit with cardiology,  nephrology,  and neurology,  prior relevant surgical and non surgical procedures, recent  labs and imaging studies, counseling on weight management,  reviewing the assessment and plan with patient, and post visit ordering and reviewing of  diagnostics and therapeutics with patient  .   Follow-up: No follow-ups on file.   Madeline Young [place], MD

## 2024-08-18 NOTE — Patient Instructions (Signed)
 Referral to Dr Tobie at [facility]  Madeline Young was sent to specialty pharmacy 3 months ago!  They should have contacted you.  We will have to look into it   Zetia sent to [facility] for cholesterol  Oxycodone  refilled for #3/daily  or #90/month

## 2024-08-18 NOTE — Progress Notes (Unsigned)
 Subjective:  Patient ID: Madeline Young, female    DOB: [birth year]  Age: 68 y.o. MRN: [id number]  CC: The primary encounter diagnosis was Familial hyperlipidemia. Diagnoses of Statin myopathy, Plaque psoriasis, and Psoriatic arthritis (HCC) were also pertinent to this visit.   HPI Ivan D Sartwell presents for No chief complaint on file.     Outpatient Medications Prior to Visit  Medication Sig Dispense Refill   Dextromethorphan-buPROPion ER (AUVELITY ) 45-105 MG TBCR Take 1 tablet by mouth every morning. (Patient not taking: Reported on 08/18/2024) 30 tablet 0   acetaminophen  (TYLENOL ) 500 MG tablet Take 1,000 mg by mouth every 6 (six) hours as needed.     ALPRAZolam  (XANAX ) 0.25 MG tablet Take 1 tablet (0.25 mg total) by mouth at bedtime as needed for anxiety. (Patient not taking: Reported on 08/18/2024) 30 tablet 2   Apremilast  (OTEZLA ) 4 x 10 & 51 x20 MG TBPK Take 10 mg by mouth daily. (Patient not taking: Reported on 08/18/2024)     aspirin 81 MG tablet Take 81 mg by mouth daily.     b complex vitamins capsule Take 1 capsule by mouth daily.     calcium  carbonate (OS-CAL - DOSED IN MG OF ELEMENTAL CALCIUM ) 1250 (500 Ca) MG tablet      celecoxib  (CELEBREX ) 200 MG capsule Take 1 capsule (200 mg total) by mouth 2 (two) times daily. 180 capsule 1   cholecalciferol (VITAMIN D ) 1000 UNITS tablet Take 2,000 Units by mouth daily.     cyanocobalamin  (VITAMIN B12) 1000 MCG tablet Take 1,000 mcg by mouth daily.     diclofenac  Sodium (VOLTAREN ) 1 % GEL APPLY 2 GRAMS TO AFFECTED AREA 4 TIMES A DAY 300 g 3   DULoxetine  (CYMBALTA ) 60 MG capsule Take 2 capsules (120 mg total) by mouth daily. 180 capsule 1   fluocinonide ointment (LIDEX) 0.05 % APPLY TWICE DAILY TO AFFECTED AREAS UNTIL IMPROVED THEN AS NEEDED FOR FLARES     leflunomide (ARAVA) 20 MG tablet Take 20 mg by mouth daily.     lisdexamfetamine (VYVANSE ) 70 MG capsule Take 1 capsule (70 mg total) by mouth daily. 90 capsule 0    Magnesium 250 MG CAPS Take by mouth. Take two by mouth daily     Multiple Vitamin (MULTIVITAMIN) tablet Take 1 tablet by mouth daily.     naloxone  (NARCAN ) 0.4 MG/ML injection Take as needed for oversedation 1 mL 3   Omega-3 Fatty Acids (FISH OIL) 1200 MG CAPS Take 1,200 mg by mouth daily.      oxyCODONE -acetaminophen  (PERCOCET/ROXICET) 5-325 MG tablet Take 1 tablet by mouth every 6 (six) hours as needed for moderate pain (pain score 4-6). Do not refill less than 30 days from prior refill 120 tablet 0   Polyethyl Glycol-Propyl Glycol 0.4-0.3 % SOLN Place 1 drop into both eyes daily as needed (for dry eyes).     Probiotic Product (PROBIOTIC DAILY PO) Take 1 capsule by mouth daily.     triamcinolone  cream (KENALOG ) 0.1 % APPLY TO AFFECTED AREA TWICE A DAY UNTIL CLEAR THEN AS NEEDED     oxyCODONE -acetaminophen  (PERCOCET/ROXICET) 5-325 MG tablet Take 1 tablet by mouth every 6 (six) hours as needed for moderate pain (pain score 4-6). Do not refill less than 30 days from prior refill 120 tablet 0   oxyCODONE -acetaminophen  (PERCOCET/ROXICET) 5-325 MG tablet Take 1 tablet by mouth every 6 (six) hours as needed for moderate pain (pain score 4-6). Do not refill less than 30 days from prior  refill 120 tablet 0   Facility-Administered Medications Prior to Visit  Medication Dose Route Frequency Provider Last Rate Last Admin   mupirocin  cream (BACTROBAN ) 2 %   Topical BID         Review of Systems;  Patient denies headache, fevers, malaise, unintentional weight loss, skin rash, eye pain, sinus congestion and sinus pain, sore throat, dysphagia,  hemoptysis , cough, dyspnea, wheezing, chest pain, palpitations, orthopnea, edema, abdominal pain, nausea, melena, diarrhea, constipation, flank pain, dysuria, hematuria, urinary  Frequency, nocturia, numbness, tingling, seizures,  Focal weakness, Loss of consciousness,  Tremor, insomnia, depression, anxiety, and suicidal ideation.      Objective:  There were no  vitals taken for this visit.  BP Readings from Last 3 Encounters:  08/18/24 (!) 150/76  05/13/24 124/60  04/21/24 138/86    Wt Readings from Last 3 Encounters:  08/18/24 148 lb 6.4 oz (67.3 kg)  05/13/24 148 lb 3.2 oz (67.2 kg)  04/21/24 143 lb (64.9 kg)    Physical Exam  Lab Results  Component Value Date   HGBA1C 5.7 05/13/2024   HGBA1C 6.1 01/31/2022    Lab Results  Component Value Date   CREATININE 0.65 05/13/2024   CREATININE 0.75 11/13/2023   CREATININE 0.75 05/16/2023    Lab Results  Component Value Date   WBC 5.0 12/17/2023   HGB 11.1 (L) 12/17/2023   HCT 33.7 (L) 12/17/2023   PLT 310.0 12/17/2023   GLUCOSE 94 05/13/2024   CHOL 356 (H) 05/13/2024   TRIG 155.0 (H) 05/13/2024   HDL 56.60 05/13/2024   LDLDIRECT 258.0 05/13/2024   LDLCALC 268 (H) 05/13/2024   ALT 18 05/13/2024   AST 17 05/13/2024   NA 143 05/13/2024   K 3.8 05/13/2024   CL 104 05/13/2024   CREATININE 0.65 05/13/2024   BUN 22 05/13/2024   CO2 31 05/13/2024   TSH 0.77 11/13/2023   INR 1.0 02/07/2023   HGBA1C 5.7 05/13/2024    VAS US  LOWER EXTREMITY VENOUS REFLUX Result Date: 02/28/2024  Lower Venous Reflux Study Patient Name:  MARYAGNES CARRASCO  Date of Exam:   02/27/2024 Medical Rec #: [id number]           Accession #:    [id number] Date of Birth: 06-02-1956           Patient Gender: F Patient Age:   33 years Exam Location:  [facility] Procedure:      VAS US  LOWER EXTREMITY VENOUS REFLUX Referring Phys: ORVIN DARING --------------------------------------------------------------------------------  Performing Technologist: Leafy Gibes RVS  Examination Guidelines: A complete evaluation includes B-mode imaging, spectral Doppler, color Doppler, and power Doppler as needed of all accessible portions of each vessel. Bilateral testing is considered an integral part of a complete examination. Limited examinations for reoccurring indications may be performed as noted. The reflux portion  of the exam is performed with the patient in reverse Trendelenburg. Significant venous reflux is defined as >500 ms in the superficial venous system, and >1 second in the deep venous system.  Venous Reflux Times +--------------+---------+------+-----------+------------+--------+ RIGHT         Reflux NoRefluxReflux TimeDiameter cmsComments                         Yes                                  +--------------+---------+------+-----------+------------+--------+ CFV  no                                             +--------------+---------+------+-----------+------------+--------+ FV prox       no                                             +--------------+---------+------+-----------+------------+--------+ FV mid        no                                             +--------------+---------+------+-----------+------------+--------+ FV dist       no                                             +--------------+---------+------+-----------+------------+--------+ Popliteal     no                                             +--------------+---------+------+-----------+------------+--------+ GSV at SFJ    no                            .50              +--------------+---------+------+-----------+------------+--------+ GSV prox thighno                            .37              +--------------+---------+------+-----------+------------+--------+ GSV mid thigh no                            .23              +--------------+---------+------+-----------+------------+--------+ GSV dist thighno                            .25              +--------------+---------+------+-----------+------------+--------+ GSV at knee   no                            .21              +--------------+---------+------+-----------+------------+--------+ GSV prox calf no                            .19               +--------------+---------+------+-----------+------------+--------+ SSV Pop Fossa no                            .24              +--------------+---------+------+-----------+------------+--------+  +--------------+---------+------+-----------+------------+--------+ LEFT          Reflux NoRefluxReflux  TimeDiameter cmsComments                         Yes                                  +--------------+---------+------+-----------+------------+--------+ CFV           no                                             +--------------+---------+------+-----------+------------+--------+ FV prox       no                                             +--------------+---------+------+-----------+------------+--------+ FV mid        no                                             +--------------+---------+------+-----------+------------+--------+ FV dist       no                                             +--------------+---------+------+-----------+------------+--------+ Popliteal     no                                             +--------------+---------+------+-----------+------------+--------+ GSV at SFJ    no                            .43              +--------------+---------+------+-----------+------------+--------+ GSV prox thighno                            .41              +--------------+---------+------+-----------+------------+--------+ GSV mid thigh no                            .27              +--------------+---------+------+-----------+------------+--------+ GSV dist thighno                            .21              +--------------+---------+------+-----------+------------+--------+ GSV at knee   no                            .21              +--------------+---------+------+-----------+------------+--------+ GSV prox calf no                            .21               +--------------+---------+------+-----------+------------+--------+  SSV Pop Fossa no                            .24              +--------------+---------+------+-----------+------------+--------+   Summary: Bilateral: - No evidence of deep vein thrombosis seen in the lower extremities, bilaterally, from the common femoral through the popliteal veins. - No evidence of superficial venous thrombosis in the lower extremities, bilaterally. - No evidence of deep venous insufficiency seen bilaterally in the lower extremity. - No evidence of superficial venous reflux seen in the greater saphenous veins bilaterally. - No evidence of superficial venous reflux seen in the short saphenous veins bilaterally.  Right: - The PTA and ATA was Imaged at the level of the Ankle; Biphasic Waveforms seen.  Left: - The PTA and ATA was Imaged at the level of the Ankle; Triphasic Waveforms seen.  *See table(s) above for measurements and observations. Electronically signed by Selinda Gu MD on 02/28/2024 at 8:13:18 AM.    Final    DG PAIN CLINIC C-ARM 1-60 MIN NO REPORT Result Date: 02/26/2024 Fluoro was used, but no Radiologist interpretation will be provided. Please refer to NOTES tab for provider progress note.   Assessment & Plan:  .Familial hyperlipidemia -     AMB Referral [facility] Management  Statin myopathy -     AMB Referral [facility] Care Management  Plaque psoriasis -     AMB Referral [facility] Management  Psoriatic arthritis (HCC) -     Ambulatory referral to Rheumatology  Other orders -     Tetanus-Diphth-Acell Pertussis; Inject 0.5 mLs into the muscle once for 1 dose.  Dispense: 0.5 mL; Refill: 0 -     Ezetimibe; Take 1 tablet (10 mg total) by mouth daily.  Dispense: 30 tablet; Refill: 2 -     oxyCODONE -Acetaminophen ; Take 1 tablet by mouth every 8 (eight) hours as needed for moderate pain (pain score 4-6). Do not refill less than 30 days from prior refill  Dispense: 90 tablet; Refill: 0 -      oxyCODONE -Acetaminophen ; Take 1 tablet by mouth every 8 (eight) hours as needed for moderate pain (pain score 4-6). Do not refill less than 30 days from prior refill  Dispense: 90 tablet; Refill: 0     I spent 34 minutes on the day of this face to face encounter reviewing patient's  most recent visit with cardiology,  nephrology,  and neurology,  prior relevant surgical and non surgical procedures, recent  labs and imaging studies, counseling on weight management,  reviewing the assessment and plan with patient, and post visit ordering and reviewing of  diagnostics and therapeutics with patient  .   Follow-up: No follow-ups on file.   Verneita LITTIE Kettering, MD

## 2024-08-19 NOTE — Assessment & Plan Note (Signed)
 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  Has been referred to Pain Management.  Reviewed the irisk  of oversedation with use of oxycodone  and alprazolam  prescribed by Dr Geoffry.  Narcan   prescribed

## 2024-08-19 NOTE — Assessment & Plan Note (Signed)
 Requesting referral to new rheumatology evaluation ; referring to Dr Tobie

## 2024-08-19 NOTE — Assessment & Plan Note (Signed)
 Currently affecting left lower leg.  Not responding to triamcinolone  cream.  Will review potential interactions between otesla and leflunomide.

## 2024-08-20 NOTE — Assessment & Plan Note (Signed)
 Reviewed previous trials of statins and repatha  as well as her 2019 Cardiac cT which had a calcium  score of zero.  She is willing to try Zetia   Lab Results  Component Value Date   CHOL 356 (H) 05/13/2024   HDL 56.60 05/13/2024   LDLCALC 268 (H) 05/13/2024   LDLDIRECT 258.0 05/13/2024   TRIG 155.0 (H) 05/13/2024   CHOLHDL 6 05/13/2024

## 2024-08-22 ENCOUNTER — Ambulatory Visit (INDEPENDENT_AMBULATORY_CARE_PROVIDER_SITE_OTHER): Payer: Medicare HMO | Admitting: *Deleted

## 2024-08-22 ENCOUNTER — Telehealth: Payer: Self-pay

## 2024-08-22 VITALS — Ht 65.0 in | Wt 145.0 lb

## 2024-08-22 DIAGNOSIS — Z0000 Encounter for general adult medical examination without abnormal findings: Secondary | ICD-10-CM | POA: Diagnosis not present

## 2024-08-22 NOTE — Patient Instructions (Signed)
 Madeline Young,  Thank you for taking the time for your Medicare Wellness Visit. I appreciate your continued commitment to your health goals. Please review the care plan we discussed, and feel free to reach out if I can assist you further.  Please note that Annual Wellness Visits do not include a physical exam. Some assessments may be limited, especially if the visit was conducted virtually. If needed, we may recommend an in-person follow-up with your provider.  Ongoing Care Seeing your primary care provider every 3 to 6 months helps us  monitor your health and provide consistent, personalized care.  Remember to get your tetanus vaccine.  Managing Pain Without Opioids Opioids are strong medicines used to treat moderate to severe pain. For some people, especially those who have long-term (chronic) pain, opioids may not be the best choice for pain management due to: Side effects like nausea, constipation, and sleepiness. The risk of addiction (opioid use disorder). The longer you take opioids, the greater your risk of addiction. Pain that lasts for more than 3 months is called chronic pain. Managing chronic pain usually requires more than one approach and is often provided by a team of health care providers working together (multidisciplinary approach). Pain management may be done at a pain management center or pain clinic. How to manage pain without the use of opioids Use non-opioid medicines Non-opioid medicines for pain may include: Over-the-counter or prescription non-steroidal anti-inflammatory drugs (NSAIDs). These may be the first medicines used for pain. They work well for muscle and bone pain, and they reduce swelling. Acetaminophen . This over-the-counter medicine may work well for milder pain but not swelling. Antidepressants. These may be used to treat chronic pain. A certain type of antidepressant (tricyclics) is often used. These medicines are given in lower doses for pain than when used  for depression. Anticonvulsants. These are usually used to treat seizures but may also reduce nerve (neuropathic) pain. Muscle relaxants. These relieve pain caused by sudden muscle tightening (spasms). You may also use a pain medicine that is applied to the skin as a patch, cream, or gel (topical analgesic), such as a numbing medicine. These may cause fewer side effects than medicines taken by mouth. Do certain therapies as directed Some therapies can help with pain management. They include: Physical therapy. You will do exercises to gain strength and flexibility. A physical therapist may teach you exercises to move and stretch parts of your body that are weak, stiff, or painful. You can learn these exercises at physical therapy visits and practice them at home. Physical therapy may also involve: Massage. Heat wraps or applying heat or cold to affected areas. Electrical signals that interrupt pain signals (transcutaneous electrical nerve stimulation, TENS). Weak lasers that reduce pain and swelling (low-level laser therapy). Signals from your body that help you learn to regulate pain (biofeedback). Occupational therapy. This helps you to learn ways to function at home and work with less pain. Recreational therapy. This involves trying new activities or hobbies, such as a physical activity or drawing. Mental health therapy, including: Cognitive behavioral therapy (CBT). This helps you learn coping skills for dealing with pain. Acceptance and commitment therapy (ACT) to change the way you think and react to pain. Relaxation therapies, including muscle relaxation exercises and mindfulness-based stress reduction. Pain management counseling. This may be individual, family, or group counseling.  Receive medical treatments Medical treatments for pain management include: Nerve block injections. These may include a pain blocker and anti-inflammatory medicines. You may have injections: Near the spine  to  relieve chronic back or neck pain. Into joints to relieve back or joint pain. Into nerve areas that supply a painful area to relieve body pain. Into muscles (trigger point injections) to relieve some painful muscle conditions. A medical device placed near your spine to help block pain signals and relieve nerve pain or chronic back pain (spinal cord stimulation device). Acupuncture. Follow these instructions at home Medicines Take over-the-counter and prescription medicines only as told by your health care provider. If you are taking pain medicine, ask your health care providers about possible side effects to watch out for. Do not drive or use heavy machinery while taking prescription opioid pain medicine. Lifestyle  Do not use drugs or alcohol  to reduce pain. If you drink alcohol , limit how much you have to: 0-1 drink a day for women who are not pregnant. 0-2 drinks a day for men. Know how much alcohol  is in a drink. In the [place] one drink equals one 12 oz bottle of beer (355 mL), one 5 oz glass of wine (148 mL), or one 1 oz glass of hard liquor (44 mL). Do not use any products that contain nicotine or tobacco. These products include cigarettes, chewing tobacco, and vaping devices, such as e-cigarettes. If you need help quitting, ask your health care provider. Eat a healthy diet and maintain a healthy weight. Poor diet and excess weight may make pain worse. Eat foods that are high in fiber. These include fresh fruits and vegetables, whole grains, and beans. Limit foods that are high in fat and processed sugars, such as fried and sweet foods. Exercise regularly. Exercise lowers stress and may help relieve pain. Ask your health care provider what activities and exercises are safe for you. If your health care provider approves, join an exercise class that combines movement and stress reduction. Examples include yoga and tai chi. Get enough sleep. Lack of sleep may make pain worse. Lower stress  as much as possible. Practice stress reduction techniques as told by your therapist. General instructions Work with all your pain management providers to find the treatments that work best for you. You are an important member of your pain management team. There are many things you can do to reduce pain on your own. Consider joining an online or in-person support group for people who have chronic pain. Keep all follow-up visits. This is important. Where to find more information You can find more information about managing pain without opioids from: American Academy of Pain Medicine: painmed.org Institute for Chronic Pain: instituteforchronicpain.org American Chronic Pain Association: theacpa.org Contact a health care provider if: You have side effects from pain medicine. Your pain gets worse or does not get better with treatments or home therapy. You are struggling with anxiety or depression. Summary Many types of pain can be managed without opioids. Chronic pain may respond better to pain management without opioids. Pain is best managed when you and a team of health care providers work together. Pain management without opioids may include non-opioid medicines, medical treatments, physical therapy, mental health therapy, and lifestyle changes. Tell your health care providers if your pain gets worse or is not being managed well enough. This information is not intended to replace advice given to you by your health care provider. Make sure you discuss any questions you have with your health care provider. Document Revised: 01/05/2021 Document Reviewed: 01/05/2021 Elsevier Patient Education  2024 Elsevier Inc.  Referrals If a referral was made during today's visit and you haven't received  any updates within two weeks, please contact the referred provider directly to check on the status.  Recommended Screenings:  Health Maintenance  Topic Date Due   DTaP/Tdap/Td vaccine (3 - Td or Tdap)  08/09/2022   COVID-19 Vaccine (6 - 2025-26 season) 09/03/2024*   Breast Cancer Screening  01/16/2025   Medicare Annual Wellness Visit  08/22/2025   Colon Cancer Screening  05/25/2029   Pneumococcal Vaccine for age over 67  Completed   Flu Shot  Completed   DEXA scan (bone density measurement)  Completed   Hepatitis C Screening  Completed   Zoster (Shingles) Vaccine  Completed   Meningitis B Vaccine  Aged Out  *Topic was postponed. The date shown is not the original due date.       08/22/2024   10:49 AM  Advanced Directives  Does Patient Have a Medical Advance Directive? Yes  Type of Estate Agent of [place];Living will  Does patient want to make changes to medical advance directive? No - Patient declined  Copy of Healthcare Power of Attorney in Chart? Yes - validated most recent copy scanned in chart (See row information)    Vision: Annual vision screenings are recommended for early detection of glaucoma, cataracts, and diabetic retinopathy. These exams can also reveal signs of chronic conditions such as diabetes and high blood pressure.  Dental: Annual dental screenings help detect early signs of oral cancer, gum disease, and other conditions linked to overall health, including heart disease and diabetes.  Please see the attached documents for additional preventive care recommendations.

## 2024-08-22 NOTE — Progress Notes (Signed)
 Chief Complaint  Patient presents with   Medicare Wellness     Subjective:   Madeline Young is a 68 y.o. female who presents for a Medicare Annual Wellness Visit.  Allergies (verified) Cosentyx [secukinumab], Sertraline hcl, Adalimumab, Avelox [moxifloxacin hcl in nacl], Nitrofurantoin, Pregabalin , Rosuvastatin, Latex, Macrobid [nitrofurantoin monohyd macro], and Statins   History: Past Medical History:  Diagnosis Date   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   ADHD (attention deficit hyperactivity disorder)    Allergy    Anxiety    Arthritis    Cataract    bil cateracts removed   Complication of anesthesia    first colonoscopy pt states she woke up   Depression    GERD (gastroesophageal reflux disease)    Globus sensation    Hyperlipidemia    Incontinence    bowels at times   Insomnia    Multinodular goiter (nontoxic)    Psoriatic arthritis (HCC)    Sjogren's syndrome 11/2023   secondary   Spinal stenosis of lumbar region 03/10/2015   MRI    Statin intolerance 01/27/2013   Unintentional Tylenol  overdose, sequela 08/14/2023   Past Surgical History:  Procedure Laterality Date   BACK SURGERY  02/17/2016   Dr. Juliane- L4-L5 fusion    BIOPSY THYROID   2014   COLONOSCOPY     COLONOSCOPY W/ POLYPECTOMY     EYE SURGERY     bilateral cataract surgery w/ lens implant   FOOT SURGERY  2006   Right foot , secondary to severe loss of joint ([place])   POLYPECTOMY     TONSILLECTOMY     UPPER GASTROINTESTINAL ENDOSCOPY  2005   With empiric esophageal dilation   Family History  Problem Relation Age of Onset   Heart disease Mother    Hyperlipidemia Mother    Coronary artery disease Father 73       CABG in early 39's   Hyperlipidemia Father    Epilepsy Grandchild        Severe form   Colon cancer Neg Hx    Esophageal cancer Neg Hx    Rectal cancer Neg Hx    Stomach cancer Neg Hx    Colon polyps Neg Hx    Crohn's disease Neg Hx    Social History   Occupational  History   Occupation: disability  Tobacco Use   Smoking status: Former    Current packs/day: 0.00    Types: Cigarettes    Quit date: 08/21/1971    Years since quitting: 53.0    Passive exposure: Never   Smokeless tobacco: Never  Vaping Use   Vaping status: Never Used  Substance and Sexual Activity   Alcohol  use: Yes    Alcohol /week: 7.0 standard drinks of alcohol     Types: 7 Glasses of wine per week    Comment: daily,wine   Drug use: No   Sexual activity: Not on file   Tobacco Counseling Counseling given: Not Answered  SDOH Screenings   Food Insecurity: No Food Insecurity (08/22/2024)  Housing: Unknown (08/22/2024)  Transportation Needs: No Transportation Needs (08/22/2024)  Utilities: Not At Risk (08/22/2024)  Alcohol  Screen: Low Risk  (08/22/2024)  Depression (PHQ2-9): Medium Risk (08/22/2024)  Financial Resource Strain: Low Risk  (08/22/2024)  Physical Activity: Inactive (08/22/2024)  Social Connections: Moderately Integrated (08/22/2024)  Stress: No Stress Concern Present (08/22/2024)  Tobacco Use: Medium Risk (08/22/2024)  Health Literacy: Adequate Health Literacy (08/22/2024)   See flowsheets for full screening details  Depression Screen PHQ 2 &  9 Depression Scale- Over the past 2 weeks, how often have you been bothered by any of the following problems? Little interest or pleasure in doing things: 0 Feeling down, depressed, or hopeless (PHQ Adolescent also includes...irritable): 0 PHQ-2 Total Score: 0 Trouble falling or staying asleep, or sleeping too much: 3 Feeling tired or having little energy: 1 Poor appetite or overeating (PHQ Adolescent also includes...weight loss): 0 Feeling bad about yourself - or that you are a failure or have let yourself or your family down: 0 Trouble concentrating on things, such as reading the newspaper or watching television (PHQ Adolescent also includes...like school work): 1 Moving or speaking so slowly that other people could  have noticed. Or the opposite - being so fidgety or restless that you have been moving around a lot more than usual: 0 Thoughts that you would be better off dead, or of hurting yourself in some way: 0 PHQ-9 Total Score: 5 If you checked off any problems, how difficult have these problems made it for you to do your work, take care of things at home, or get along with other people?: Somewhat difficult     Goals Addressed             This Visit's Progress    Patient Stated       Wants to get more stable with walking       Visit info / Clinical Intake: Medicare Wellness Visit Type:: Subsequent Annual Wellness Visit Persons participating in visit:: patient Medicare Wellness Visit Mode:: Telephone If telephone:: video declined Because this visit was a virtual/telehealth visit:: pt reported vitals If Telephone or Video please confirm:: I connected with the patient using audio enabled telemedicine application and verified that I am speaking with the correct person using two identifiers; I discussed the limitations of evaluation and management by telemedicine; The patient expressed understanding and agreed to proceed Patient Location:: Home Provider Location:: Office/Home Information given by:: patient Interpreter Needed?: No Pre-visit prep was completed: yes AWV questionnaire completed by patient prior to visit?: no Living arrangements:: lives with spouse/significant other Patient's Overall Health Status Rating: (!) fair Typical amount of pain: (!) a lot Does pain affect daily life?: (!) yes Are you currently prescribed opioids?: (!) yes  Dietary Habits and Nutritional Risks How many meals a day?: 3 Eats fruit and vegetables daily?: yes Most meals are obtained by: preparing own meals In the last 2 weeks, have you had any of the following?: none Diabetic:: no  Functional Status Activities of Daily Living (to include ambulation/medication): Independent Ambulation: Independent  with device- listed below Home Assistive Devices/Equipment: Cane (walker) Medication Administration: Independent Home Management: Needs assistance (comment) Manage your own finances?: yes Primary transportation is: driving Concerns about vision?: (!) yes Concerns about hearing?: (!) yes (some hearing loss) Uses hearing aids?: no Hear whispered voice?: -- (televisit)  Fall Screening Falls in the past year?: 1 Number of falls in past year: 1 Was there an injury with Fall?: 1 (brusing, did not have to go to the doctor) Fall Risk Category Calculator: 3 Patient Fall Risk Level: High Fall Risk  Fall Risk Patient at Risk for Falls Due to: History of fall(s); Impaired balance/gait Fall risk Follow up: Falls evaluation completed; Falls prevention discussed  Home and Transportation Safety: All rugs have non-skid backing?: yes All stairs or steps have railings?: yes Grab bars in the bathtub or shower?: yes Have non-skid surface in bathtub or shower?: yes Good home lighting?: yes Regular seat belt use?: yes Hospital  stays in the last year:: no  Cognitive Assessment Difficulty concentrating, remembering, or making decisions? : yes Will 6CIT or Mini Cog be Completed: yes What year is it?: 0 points What month is it?: 0 points Give patient an address phrase to remember (5 components): [address] [place] About what time is it?: 0 points Count backwards from 20 to 1: 0 points Say the months of the year in reverse: 0 points Repeat the address phrase from earlier: 0 points 6 CIT Score: 0 points  Advance Directives (For Healthcare) Does Patient Have a Medical Advance Directive?: Yes Does patient want to make changes to medical advance directive?: No - Patient declined Type of Advance Directive: Healthcare Power of [place]; Living will Copy of Healthcare Power of Attorney in Chart?: Yes - validated most recent copy scanned in chart (See row information) Copy of Living Will in  Chart?: Yes - validated most recent copy scanned in chart (See row information)  Reviewed/Updated  Reviewed/Updated: Reviewed All (Medical, Surgical, Family, Medications, Allergies, Care Teams, Patient Goals)        Objective:    Today's Vitals   08/22/24 1038  Weight: 145 lb (65.8 kg)  Height: 5' 5 (1.651 m)   Body mass index is 24.13 kg/m.  Current Medications (verified) Outpatient Encounter Medications as of 08/22/2024  Medication Sig   acetaminophen  (TYLENOL ) 500 MG tablet Take 1,000 mg by mouth every 6 (six) hours as needed.   Ascorbic Acid (VITAMIN C) 1000 MG tablet Take 1,000 mg by mouth daily.   aspirin 81 MG tablet Take 81 mg by mouth daily.   b complex vitamins capsule Take 1 capsule by mouth daily.   calcium  carbonate (OS-CAL - DOSED IN MG OF ELEMENTAL CALCIUM ) 1250 (500 Ca) MG tablet    celecoxib  (CELEBREX ) 200 MG capsule Take 1 capsule (200 mg total) by mouth 2 (two) times daily.   cholecalciferol (VITAMIN D ) 1000 UNITS tablet Take 2,000 Units by mouth daily.   Dextromethorphan-buPROPion ER (AUVELITY ) 45-105 MG TBCR Take 1 tablet by mouth every morning. (Patient not taking: Reported on 08/22/2024)   diclofenac  Sodium (VOLTAREN ) 1 % GEL APPLY 2 GRAMS TO AFFECTED AREA 4 TIMES A DAY   DULoxetine  (CYMBALTA ) 60 MG capsule Take 2 capsules (120 mg total) by mouth daily.   fluocinonide ointment (LIDEX) 0.05 % APPLY TWICE DAILY TO AFFECTED AREAS UNTIL IMPROVED THEN AS NEEDED FOR FLARES (Patient taking differently: daily as needed.)   leflunomide (ARAVA) 20 MG tablet Take 20 mg by mouth daily.   lisdexamfetamine (VYVANSE ) 70 MG capsule Take 1 capsule (70 mg total) by mouth daily.   Magnesium 250 MG CAPS Take by mouth. Take two by mouth daily   Multiple Vitamin (MULTIVITAMIN) tablet Take 1 tablet by mouth daily.   naloxone  (NARCAN ) 0.4 MG/ML injection Take as needed for oversedation   Omega-3 Fatty Acids (FISH OIL) 1200 MG CAPS Take 1,200 mg by mouth daily.     oxyCODONE -acetaminophen  (PERCOCET/ROXICET) 5-325 MG tablet Take 1 tablet by mouth every 6 (six) hours as needed for moderate pain (pain score 4-6). Do not refill less than 30 days from prior refill   oxyCODONE -acetaminophen  (PERCOCET/ROXICET) 5-325 MG tablet Take 1 tablet by mouth every 8 (eight) hours as needed for moderate pain (pain score 4-6). Do not refill less than 30 days from prior refill   oxyCODONE -acetaminophen  (PERCOCET/ROXICET) 5-325 MG tablet Take 1 tablet by mouth every 8 (eight) hours as needed for moderate pain (pain score 4-6). Do not refill less  than 30 days from prior refill   Polyethyl Glycol-Propyl Glycol 0.4-0.3 % SOLN Place 1 drop into both eyes daily as needed (for dry eyes).   Probiotic Product (PROBIOTIC DAILY PO) Take 1 capsule by mouth daily.   triamcinolone  cream (KENALOG ) 0.1 % APPLY TO AFFECTED AREA TWICE A DAY UNTIL CLEAR THEN AS NEEDED (Patient taking differently: daily as needed.)   zinc  gluconate 50 MG tablet Take 50 mg by mouth daily.   ALPRAZolam  (XANAX ) 0.25 MG tablet Take 1 tablet (0.25 mg total) by mouth at bedtime as needed for anxiety. (Patient not taking: Reported on 08/22/2024)   Apremilast  (OTEZLA ) 4 x 10 & 51 x20 MG TBPK Take 10 mg by mouth daily. (Patient not taking: Reported on 08/22/2024)   cyanocobalamin  (VITAMIN B12) 1000 MCG tablet Take 1,000 mcg by mouth daily. (Patient not taking: Reported on 08/22/2024)   ezetimibe (ZETIA) 10 MG tablet Take 1 tablet (10 mg total) by mouth daily. (Patient not taking: Reported on 08/22/2024)   Tdap (ADACEL) 02-07-14.5 LF-MCG/0.5 injection Inject 0.5 mLs into the muscle once for 1 dose. (Patient not taking: Reported on 08/22/2024)   Facility-Administered Encounter Medications as of 08/22/2024  Medication   mupirocin  cream (BACTROBAN ) 2 %   Hearing/Vision screen Hearing Screening - Comments:: Some issues, but not ready for hearing aids yet Vision Screening - Comments:: Glasses, [facility], up to  date Immunizations and Health Maintenance Health Maintenance  Topic Date Due   DTaP/Tdap/Td (3 - Td or Tdap) 08/09/2022   COVID-19 Vaccine (6 - 2025-26 season) 09/03/2024 (Originally 06/09/2024)   Mammogram  01/16/2025   Medicare Annual Wellness (AWV)  08/22/2025   Colonoscopy  05/25/2029   Pneumococcal Vaccine: 50+ Years  Completed   Influenza Vaccine  Completed   DEXA SCAN  Completed   Hepatitis C Screening  Completed   Zoster Vaccines- Shingrix  Completed   Meningococcal B Vaccine  Aged Out        Assessment/Plan:  This is a routine wellness examination for Brena.  Patient Care Team: Marylynn Verneita CROME, MD as PCP - General (Internal Medicine) Roylene Garre, DO as Referring Physician (Rheumatology) Dena Berg, MD as Referring Physician (Orthopedic Surgery) Gaylord Clover, MD (Orthopedic Surgery) Mavis Purchase, MD as Consulting Physician (Neurosurgery)  I have personally reviewed and noted the following in the patient's chart:   Medical and social history Use of alcohol , tobacco or illicit drugs  Current medications and supplements including opioid prescriptions. Functional ability and status Nutritional status Physical activity Advanced directives List of other physicians Hospitalizations, surgeries, and ER visits in previous 12 months Vitals Screenings to include cognitive, depression, and falls Referrals and appointments  No orders of the defined types were placed in this encounter.  In addition, I have reviewed and discussed with patient certain preventive protocols, quality metrics, and best practice recommendations. A written personalized care plan for preventive services as well as general preventive health recommendations were provided to patient.   Angeline Fredericks, LPN   88/85/7974   Return in 1 year (on 08/22/2025).  After Visit Summary: (MyChart) Due to this being a telephonic visit, the after visit summary with patients personalized plan was  offered to patient via MyChart   Nurse Notes: Patient plans on getting her tetanus vaccine soon. Patient is having problems with her jaw. Patient is being seen by Dr. Melba at [facility] and is wearing a mouth brace to help with the pain.

## 2024-08-22 NOTE — Progress Notes (Signed)
 Complex Care Management Note Care Guide Note  08/22/2024 Name: Madeline Young MRN: [id number] DOB: December 11, 1955   Complex Care Management Outreach Attempts: An unsuccessful telephone outreach was attempted today to offer the patient information about available complex care management services.  Follow Up Plan:  Additional outreach attempts will be made to offer the patient complex care management information and services.   Encounter Outcome:  No Answer  Dreama [facility]  [facility], [facility] VBCI Assistant Direct Dial: [phone]  Fax: [phone]

## 2024-08-29 NOTE — Progress Notes (Signed)
 Complex Care Management Note  Care Guide Note 08/29/2024 Name: Madeline Young MRN: [id number] DOB: 08/21/1956  Madeline Young is a 68 y.o. year old female who sees Marylynn, Verneita CROME, MD for primary care. I reached out to Callie D Laszlo by phone today to offer complex care management services.  [name] was given information about Complex Care Management services today including:   The Complex Care Management services include support from the care team which includes your Nurse Care Manager, Clinical Social Worker, or Pharmacist.  The Complex Care Management team is here to help remove barriers to the health concerns and goals most important to you. Complex Care Management services are voluntary, and the patient may decline or stop services at any time by request to their care team member.   Complex Care Management Consent Status: Patient agreed to services and verbal consent obtained.   Follow up plan:  Telephone appointment with complex care management team member scheduled for:  09/02/24 at 11:00 a.m.  Encounter Outcome:  Patient Scheduled  Dreama [facility]  [facility], [facility] VBCI Assistant Direct Dial: [phone]  Fax: [phone]

## 2024-09-02 ENCOUNTER — Other Ambulatory Visit: Admitting: Pharmacist

## 2024-09-02 DIAGNOSIS — L4052 Psoriatic arthritis mutilans: Secondary | ICD-10-CM

## 2024-09-02 NOTE — Progress Notes (Signed)
   09/02/2024 Name: Madeline Young MRN: [id number] DOB: 1955-10-27  Subjective  Chief Complaint  Patient presents with   Medication Access    Care Team: Primary Care Provider: Marylynn Verneita CROME, MD  Reason for visit: ?  Madeline Young is a 68 y.o. female who presents today for a telephone visit with the pharmacist due to medication access concerns regarding their brand medications, Otezla . ?   Medication Access: ?  Otesla stater pack was sent to specialty pharmacy at last visit in August. Patient states that she never received it was never called though does not want to start it at this time. Preference is to wait until she sees new Rheumatologist at [facility] (Dr. Tobie) given fear of side effects, or in the event they prefer to start a different medication.   Prescription drug coverage: YES Payor: BLUE CROSS BLUE SHIELD MEDICARE / Plan: BCBS MEDICARE / Product Type: *No Product type* / .   Current Patient Assistance: None. Notes she received assistance for a biologic in the past.   Patient lives in a household of 2 with an estimated combined annual income of primarily SSI retirement between her and her husband.  Medicare LIS Eligible: No  Income limit exceeded.   2025 Poverty Guidelines  Family Size  300%   1  $46,950   2  $63,450   3  $79,950  4  $96,450  Programs [employer] (Otezla )   Assessment and Plan:   1. Medication Access Seeing new rheumatologist at [facility], though has not been scheduled yet. Referral placed. Patient plans to call Rheum clinic this week to see what the wait it to be scheduled for an initial visit.  [facility] specialty. Test claim showing $564.68 for starter pack. Unclear what maintenance dose would cost. Unfortunately cannot use copay cards given Medicare insurance.  Patient assistance options: [employer] Safety Net foundation (income requirement = <$63,450 for household of 2. Patient unsure of her income though notes mostly SSI retirement, so  suspect a good chance of acceptance.   Currently, patient preference is to wait to start or change any medication until she sees her new rheumatologist given significant adverse reactions to prior medications.   Forwarded to PCP for consideration.   Future Appointments  Date Time Provider Department Center  11/11/2024  1:00 PM Cottle, Madeline KANDICE Raddle., MD [facility] None  11/18/2024  1:30 PM Madeline Verneita CROME, MD [facility] [address]  08/26/2025  1:00 PM [facility] [place] VISIT [facility] Madeline Young, PharmD Clinical Pharmacist [facility] [phone]

## 2024-09-02 NOTE — Patient Instructions (Addendum)
 Ms. Bertina Guthridge Gattuso,   It was a pleasure to speak with you today! As we discussed:?   Otezla  is a medication that must be filled through a specialty pharmacy. Your current prescription was for the starter pack (usually a 14-day supply) and was close to $500 according to [facility] specialty pharmacy.  However, many patient with Medicare can often qualify for assistance programs through the drug manufacturer.   ____________________________ Otezla  has a program that could provide Otezla  for free if you meet the following criteria: [employer] Safety Net Foundation: Household income at or below [phone] for a household of 2 people We (or the rheumatology office) would help you complete this application as needed, making the process easier for you.   If approved for this program, then Otezla  would be $0 and you would not have to worry about the cost through the insurance.   _____________________________ Your Rheumatology referral was placed 11/10 for Dr. Tobie. We discussed calling their clinic and seeing if they can schedule your initial visit with Dr. Tobie.    You may respond directly to this message, or leave me a voicemail at [phone] and I will get back to you shortly.   Manuelita FABIENE Kobs, PharmD Clinical Pharmacist [facility] [phone]

## 2024-09-15 ENCOUNTER — Other Ambulatory Visit: Payer: Self-pay

## 2024-09-15 ENCOUNTER — Telehealth: Payer: Self-pay | Admitting: Psychiatry

## 2024-09-15 DIAGNOSIS — F900 Attention-deficit hyperactivity disorder, predominantly inattentive type: Secondary | ICD-10-CM

## 2024-09-15 NOTE — Telephone Encounter (Signed)
 LF 9/20 for #90, due 12/17.

## 2024-09-15 NOTE — Telephone Encounter (Signed)
 Pt is worried that she does not have enough Vyvanse . Has only 3-4 left but will alsobe going out of town Sunday to visit her daughters in [place] for several days. She is asking to double check and see if she can get it filled. [phone]

## 2024-09-15 NOTE — Telephone Encounter (Signed)
 Pt Lvm @ 10:23a requesting refill of Vyvanse  90 day script to   [id number] [place] [place] - [address], [place] [address] Phone: [phone]  Fax: [phone]   She said she has 3 days left. Next appt 2/3

## 2024-09-16 NOTE — Telephone Encounter (Signed)
 Pt called back at 2:10p stating she has found 6 pills in her overnight case.. but she wants you to call her back after 3. she's going into an appt

## 2024-09-17 NOTE — Telephone Encounter (Signed)
 Pt wants Rx sent now so if pharmacy doesn't have it she can look elsewhere.

## 2024-09-18 NOTE — Telephone Encounter (Signed)
 Pt not due for a RF of Vyvanse  until 12/17. She reports going to [place] on 12/13. Will pend as appropriate.

## 2024-09-19 MED ORDER — LISDEXAMFETAMINE DIMESYLATE 70 MG PO CAPS: 70.0000 mg | ORAL_CAPSULE | Freq: Every day | ORAL | 0 refills | Status: DC

## 2024-09-19 NOTE — Telephone Encounter (Signed)
 Pended for early RF

## 2024-11-11 ENCOUNTER — Telehealth: Admitting: Psychiatry

## 2024-11-11 ENCOUNTER — Encounter: Payer: Self-pay | Admitting: Psychiatry

## 2024-11-11 DIAGNOSIS — F3341 Major depressive disorder, recurrent, in partial remission: Secondary | ICD-10-CM

## 2024-11-11 DIAGNOSIS — F900 Attention-deficit hyperactivity disorder, predominantly inattentive type: Secondary | ICD-10-CM

## 2024-11-11 DIAGNOSIS — F5105 Insomnia due to other mental disorder: Secondary | ICD-10-CM

## 2024-11-11 DIAGNOSIS — G8929 Other chronic pain: Secondary | ICD-10-CM

## 2024-11-11 MED ORDER — LISDEXAMFETAMINE DIMESYLATE 70 MG PO CAPS: 70.0000 mg | ORAL_CAPSULE | Freq: Every day | ORAL | 0 refills | Status: AC

## 2024-11-11 MED ORDER — DULOXETINE HCL 60 MG PO CPEP: 120.0000 mg | ORAL_CAPSULE | Freq: Every day | ORAL | 1 refills | Status: AC

## 2024-11-18 ENCOUNTER — Ambulatory Visit: Admitting: Internal Medicine

## 2025-08-26 ENCOUNTER — Ambulatory Visit
